# Patient Record
Sex: Female | Born: 1970 | Race: Black or African American | Hispanic: No | Marital: Married | Smoking: Former smoker
Health system: Southern US, Community
[De-identification: ages and names within clinical notes are randomized; demographics above are authoritative.]

## PROBLEM LIST (undated history)

## (undated) DIAGNOSIS — I2699 Other pulmonary embolism without acute cor pulmonale: Secondary | ICD-10-CM

## (undated) DIAGNOSIS — M797 Fibromyalgia: Secondary | ICD-10-CM

## (undated) DIAGNOSIS — E282 Polycystic ovarian syndrome: Secondary | ICD-10-CM

## (undated) DIAGNOSIS — R000 Tachycardia, unspecified: Secondary | ICD-10-CM

## (undated) DIAGNOSIS — I82602 Acute embolism and thrombosis of unspecified veins of left upper extremity: Secondary | ICD-10-CM

## (undated) DIAGNOSIS — M7732 Calcaneal spur, left foot: Secondary | ICD-10-CM

## (undated) DIAGNOSIS — M65312 Trigger thumb, left thumb: Secondary | ICD-10-CM

## (undated) DIAGNOSIS — I10 Essential (primary) hypertension: Secondary | ICD-10-CM

## (undated) DIAGNOSIS — E039 Hypothyroidism, unspecified: Secondary | ICD-10-CM

## (undated) DIAGNOSIS — M1990 Unspecified osteoarthritis, unspecified site: Secondary | ICD-10-CM

## (undated) DIAGNOSIS — H409 Unspecified glaucoma: Secondary | ICD-10-CM

## (undated) DIAGNOSIS — J449 Chronic obstructive pulmonary disease, unspecified: Secondary | ICD-10-CM

## (undated) DIAGNOSIS — M5126 Other intervertebral disc displacement, lumbar region: Secondary | ICD-10-CM

## (undated) DIAGNOSIS — E785 Hyperlipidemia, unspecified: Secondary | ICD-10-CM

## (undated) DIAGNOSIS — Z9889 Other specified postprocedural states: Secondary | ICD-10-CM

## (undated) DIAGNOSIS — F419 Anxiety disorder, unspecified: Secondary | ICD-10-CM

## (undated) DIAGNOSIS — M7731 Calcaneal spur, right foot: Secondary | ICD-10-CM

## (undated) DIAGNOSIS — M6530 Trigger finger, unspecified finger: Secondary | ICD-10-CM

## (undated) DIAGNOSIS — R112 Nausea with vomiting, unspecified: Secondary | ICD-10-CM

## (undated) DIAGNOSIS — S0300XA Dislocation of jaw, unspecified side, initial encounter: Secondary | ICD-10-CM

## (undated) DIAGNOSIS — M51369 Other intervertebral disc degeneration, lumbar region without mention of lumbar back pain or lower extremity pain: Secondary | ICD-10-CM

## (undated) DIAGNOSIS — H6993 Unspecified Eustachian tube disorder, bilateral: Secondary | ICD-10-CM

## (undated) DIAGNOSIS — H6983 Other specified disorders of Eustachian tube, bilateral: Secondary | ICD-10-CM

## (undated) DIAGNOSIS — F411 Generalized anxiety disorder: Secondary | ICD-10-CM

## (undated) DIAGNOSIS — M5136 Other intervertebral disc degeneration, lumbar region: Secondary | ICD-10-CM

## (undated) DIAGNOSIS — K219 Gastro-esophageal reflux disease without esophagitis: Secondary | ICD-10-CM

## (undated) DIAGNOSIS — E119 Type 2 diabetes mellitus without complications: Secondary | ICD-10-CM

## (undated) DIAGNOSIS — G5603 Carpal tunnel syndrome, bilateral upper limbs: Secondary | ICD-10-CM

## (undated) HISTORY — PX: DILATION AND CURETTAGE OF UTERUS: SHX78

## (undated) HISTORY — DX: Dislocation of jaw, unspecified side, initial encounter: S03.00XA

## (undated) HISTORY — DX: Unspecified eustachian tube disorder, bilateral: H69.93

## (undated) HISTORY — DX: Trigger thumb, left thumb: M65.312

## (undated) HISTORY — PX: CARPAL TUNNEL RELEASE: SHX101

## (undated) HISTORY — DX: Fibromyalgia: M79.7

## (undated) HISTORY — DX: Type 2 diabetes mellitus without complications: E11.9

## (undated) HISTORY — DX: Trigger finger, unspecified finger: M65.30

## (undated) HISTORY — DX: Hyperlipidemia, unspecified: E78.5

## (undated) HISTORY — DX: Tachycardia, unspecified: R00.0

## (undated) HISTORY — PX: BREAST SURGERY: SHX581

## (undated) HISTORY — PX: EXPLORATORY LAPAROTOMY: SUR591

## (undated) HISTORY — DX: Generalized anxiety disorder: F41.1

## (undated) HISTORY — PX: OTHER SURGICAL HISTORY: SHX169

## (undated) HISTORY — DX: Other specified disorders of eustachian tube, bilateral: H69.83

---

## 1998-11-18 HISTORY — PX: REDUCTION MAMMAPLASTY: SUR839

## 2000-06-26 ENCOUNTER — Other Ambulatory Visit: Admission: RE | Admit: 2000-06-26 | Discharge: 2000-06-26 | Payer: Self-pay | Admitting: Family Medicine

## 2001-10-01 ENCOUNTER — Encounter: Admission: RE | Admit: 2001-10-01 | Discharge: 2001-11-02 | Payer: Self-pay | Admitting: Orthopaedic Surgery

## 2002-07-26 ENCOUNTER — Other Ambulatory Visit: Admission: RE | Admit: 2002-07-26 | Discharge: 2002-07-26 | Payer: Self-pay | Admitting: Family Medicine

## 2003-09-20 ENCOUNTER — Emergency Department (HOSPITAL_COMMUNITY): Admission: EM | Admit: 2003-09-20 | Discharge: 2003-09-20 | Payer: Self-pay | Admitting: Emergency Medicine

## 2003-09-20 ENCOUNTER — Emergency Department (HOSPITAL_COMMUNITY): Admission: RE | Admit: 2003-09-20 | Discharge: 2003-09-20 | Payer: Self-pay | Admitting: Emergency Medicine

## 2003-09-27 ENCOUNTER — Other Ambulatory Visit: Admission: RE | Admit: 2003-09-27 | Discharge: 2003-09-27 | Payer: Self-pay | Admitting: Family Medicine

## 2004-10-23 ENCOUNTER — Emergency Department (HOSPITAL_COMMUNITY): Admission: EM | Admit: 2004-10-23 | Discharge: 2004-10-23 | Payer: Self-pay | Admitting: Emergency Medicine

## 2004-11-07 ENCOUNTER — Ambulatory Visit (HOSPITAL_COMMUNITY): Admission: RE | Admit: 2004-11-07 | Discharge: 2004-11-07 | Payer: Self-pay | Admitting: Cardiology

## 2004-11-07 ENCOUNTER — Encounter (INDEPENDENT_AMBULATORY_CARE_PROVIDER_SITE_OTHER): Payer: Self-pay | Admitting: Cardiology

## 2004-12-25 ENCOUNTER — Emergency Department (HOSPITAL_COMMUNITY): Admission: EM | Admit: 2004-12-25 | Discharge: 2004-12-25 | Payer: Self-pay | Admitting: Emergency Medicine

## 2005-03-04 ENCOUNTER — Other Ambulatory Visit: Admission: RE | Admit: 2005-03-04 | Discharge: 2005-03-04 | Payer: Self-pay | Admitting: *Deleted

## 2006-05-05 ENCOUNTER — Emergency Department (HOSPITAL_COMMUNITY): Admission: EM | Admit: 2006-05-05 | Discharge: 2006-05-05 | Payer: Self-pay | Admitting: Emergency Medicine

## 2007-04-09 ENCOUNTER — Emergency Department (HOSPITAL_COMMUNITY): Admission: EM | Admit: 2007-04-09 | Discharge: 2007-04-10 | Payer: Self-pay | Admitting: Emergency Medicine

## 2010-03-02 ENCOUNTER — Emergency Department (HOSPITAL_COMMUNITY): Admission: EM | Admit: 2010-03-02 | Discharge: 2010-03-02 | Payer: Self-pay | Admitting: Emergency Medicine

## 2010-12-08 ENCOUNTER — Encounter: Payer: Self-pay | Admitting: Emergency Medicine

## 2011-10-08 ENCOUNTER — Other Ambulatory Visit: Payer: Self-pay | Admitting: Family Medicine

## 2011-10-08 DIAGNOSIS — E041 Nontoxic single thyroid nodule: Secondary | ICD-10-CM

## 2011-10-09 ENCOUNTER — Ambulatory Visit (HOSPITAL_COMMUNITY)
Admission: RE | Admit: 2011-10-09 | Discharge: 2011-10-09 | Disposition: A | Discharge status: Home / Self Care | Payer: Self-pay | Source: Ambulatory Visit | Attending: Family Medicine | Admitting: Family Medicine

## 2011-10-09 ENCOUNTER — Other Ambulatory Visit: Payer: Self-pay | Admitting: Family Medicine

## 2011-10-09 DIAGNOSIS — E041 Nontoxic single thyroid nodule: Secondary | ICD-10-CM | POA: Insufficient documentation

## 2011-10-09 DIAGNOSIS — Z139 Encounter for screening, unspecified: Secondary | ICD-10-CM

## 2011-10-14 ENCOUNTER — Other Ambulatory Visit: Payer: Self-pay | Admitting: Family Medicine

## 2011-10-14 DIAGNOSIS — R102 Pelvic and perineal pain: Secondary | ICD-10-CM

## 2011-10-17 ENCOUNTER — Other Ambulatory Visit (HOSPITAL_COMMUNITY): Payer: Self-pay

## 2011-10-30 ENCOUNTER — Other Ambulatory Visit: Payer: Self-pay | Admitting: Family Medicine

## 2011-10-30 DIAGNOSIS — M79669 Pain in unspecified lower leg: Secondary | ICD-10-CM

## 2011-11-05 ENCOUNTER — Ambulatory Visit (HOSPITAL_COMMUNITY)
Admission: RE | Admit: 2011-11-05 | Discharge: 2011-11-05 | Disposition: A | Discharge status: Home / Self Care | Payer: Medicaid Other | Source: Ambulatory Visit | Attending: Family Medicine | Admitting: Family Medicine

## 2011-11-05 ENCOUNTER — Other Ambulatory Visit: Payer: Self-pay | Admitting: Family Medicine

## 2011-11-05 ENCOUNTER — Other Ambulatory Visit (HOSPITAL_COMMUNITY): Payer: Self-pay | Admitting: Family Medicine

## 2011-11-05 DIAGNOSIS — M79669 Pain in unspecified lower leg: Secondary | ICD-10-CM

## 2011-11-05 DIAGNOSIS — R102 Pelvic and perineal pain: Secondary | ICD-10-CM

## 2011-11-05 DIAGNOSIS — R0989 Other specified symptoms and signs involving the circulatory and respiratory systems: Secondary | ICD-10-CM

## 2011-11-05 DIAGNOSIS — R9389 Abnormal findings on diagnostic imaging of other specified body structures: Secondary | ICD-10-CM | POA: Insufficient documentation

## 2011-11-05 DIAGNOSIS — N949 Unspecified condition associated with female genital organs and menstrual cycle: Secondary | ICD-10-CM | POA: Insufficient documentation

## 2011-11-08 ENCOUNTER — Ambulatory Visit (HOSPITAL_COMMUNITY)
Admission: RE | Admit: 2011-11-08 | Discharge: 2011-11-08 | Payer: Medicaid Other | Source: Ambulatory Visit | Attending: Family Medicine | Admitting: Family Medicine

## 2011-11-20 ENCOUNTER — Ambulatory Visit (HOSPITAL_COMMUNITY)
Admission: RE | Admit: 2011-11-20 | Discharge: 2011-11-20 | Disposition: A | Discharge status: Home / Self Care | Payer: Medicaid Other | Source: Ambulatory Visit | Attending: Family Medicine | Admitting: Family Medicine

## 2011-11-20 DIAGNOSIS — R229 Localized swelling, mass and lump, unspecified: Secondary | ICD-10-CM | POA: Insufficient documentation

## 2011-11-20 DIAGNOSIS — R0989 Other specified symptoms and signs involving the circulatory and respiratory systems: Secondary | ICD-10-CM

## 2011-11-20 DIAGNOSIS — D1779 Benign lipomatous neoplasm of other sites: Secondary | ICD-10-CM | POA: Insufficient documentation

## 2011-11-20 LAB — CREATININE, SERUM
Creatinine, Ser: 0.81 mg/dL (ref 0.50–1.10)
GFR calc Af Amer: >90 mL/min (ref >90)
GFR calc non Af Amer: 88 mL/min — ABNORMAL LOW (ref >90)

## 2011-11-20 LAB — BUN: BUN: 14 mg/dL (ref 6–23)

## 2011-11-20 MED ORDER — GADOBENATE DIMEGLUMINE 529 MG/ML IV SOLN
15.0000 mL | Freq: Once | INTRAVENOUS | Status: AC | PRN
  Administered 2011-11-20: 15 mL via INTRAVENOUS

## 2011-12-17 ENCOUNTER — Other Ambulatory Visit: Payer: Self-pay | Admitting: Family Medicine

## 2011-12-17 DIAGNOSIS — Z09 Encounter for follow-up examination after completed treatment for conditions other than malignant neoplasm: Secondary | ICD-10-CM

## 2011-12-20 ENCOUNTER — Other Ambulatory Visit (HOSPITAL_COMMUNITY): Payer: Self-pay | Admitting: Family Medicine

## 2011-12-20 ENCOUNTER — Ambulatory Visit (HOSPITAL_COMMUNITY)
Admission: RE | Admit: 2011-12-20 | Discharge: 2011-12-20 | Disposition: A | Discharge status: Home / Self Care | Payer: Medicaid Other | Source: Ambulatory Visit | Attending: Family Medicine | Admitting: Family Medicine

## 2011-12-20 DIAGNOSIS — Z09 Encounter for follow-up examination after completed treatment for conditions other than malignant neoplasm: Secondary | ICD-10-CM

## 2011-12-20 DIAGNOSIS — R9389 Abnormal findings on diagnostic imaging of other specified body structures: Secondary | ICD-10-CM | POA: Insufficient documentation

## 2011-12-20 DIAGNOSIS — N949 Unspecified condition associated with female genital organs and menstrual cycle: Secondary | ICD-10-CM | POA: Insufficient documentation

## 2012-02-24 ENCOUNTER — Encounter (HOSPITAL_COMMUNITY): Payer: Self-pay | Admitting: Emergency Medicine

## 2012-02-24 ENCOUNTER — Emergency Department (HOSPITAL_COMMUNITY)
Admission: EM | Admit: 2012-02-24 | Discharge: 2012-02-24 | Disposition: A | Discharge status: Home / Self Care | Payer: Self-pay | Attending: Emergency Medicine | Admitting: Emergency Medicine

## 2012-02-24 DIAGNOSIS — Z79899 Other long term (current) drug therapy: Secondary | ICD-10-CM | POA: Insufficient documentation

## 2012-02-24 DIAGNOSIS — N949 Unspecified condition associated with female genital organs and menstrual cycle: Secondary | ICD-10-CM | POA: Insufficient documentation

## 2012-02-24 DIAGNOSIS — R109 Unspecified abdominal pain: Secondary | ICD-10-CM | POA: Insufficient documentation

## 2012-02-24 DIAGNOSIS — N898 Other specified noninflammatory disorders of vagina: Secondary | ICD-10-CM | POA: Insufficient documentation

## 2012-02-24 DIAGNOSIS — R102 Pelvic and perineal pain: Secondary | ICD-10-CM

## 2012-02-24 DIAGNOSIS — Z9889 Other specified postprocedural states: Secondary | ICD-10-CM | POA: Insufficient documentation

## 2012-02-24 DIAGNOSIS — J45909 Unspecified asthma, uncomplicated: Secondary | ICD-10-CM | POA: Insufficient documentation

## 2012-02-24 MED ORDER — AZITHROMYCIN 250 MG PO TABS
1000.0000 mg | ORAL_TABLET | Freq: Once | ORAL | Status: AC
  Administered 2012-02-24: 1000 mg via ORAL
  Filled 2012-02-24: qty 4

## 2012-02-24 MED ORDER — METRONIDAZOLE 500 MG PO TABS: 500.0000 mg | ORAL_TABLET | Freq: Three times a day (TID) | ORAL | Status: AC

## 2012-02-24 MED ORDER — DOXYCYCLINE HYCLATE 100 MG PO CAPS: 100.0000 mg | ORAL_CAPSULE | Freq: Two times a day (BID) | ORAL | Status: AC

## 2012-02-24 MED ORDER — CEFTRIAXONE SODIUM 250 MG IJ SOLR
250.0000 mg | Freq: Once | INTRAMUSCULAR | Status: AC
  Administered 2012-02-24: 250 mg via INTRAMUSCULAR
  Filled 2012-02-24: qty 250

## 2012-02-24 MED ORDER — LIDOCAINE HCL (PF) 2 % IJ SOLN
2.0000 mL | Freq: Once | INTRAMUSCULAR | Status: AC
  Administered 2012-02-24: 2 mL
  Filled 2012-02-24: qty 10

## 2012-02-24 MED ORDER — OXYCODONE-ACETAMINOPHEN 5-325 MG PO TABS
1.0000 | ORAL_TABLET | Freq: Once | ORAL | Status: AC
  Administered 2012-02-24: 1 via ORAL
  Filled 2012-02-24: qty 2

## 2012-02-24 MED ORDER — HYDROCODONE-ACETAMINOPHEN 5-325 MG PO TABS: 1.0000 | ORAL_TABLET | Freq: Four times a day (QID) | ORAL | Status: AC | PRN

## 2012-02-24 NOTE — ED Notes (Signed)
Pt states she thinks she may have a tampon stuck in her vagina. Pt c/o lower abd pain since last night.

## 2012-02-24 NOTE — ED Provider Notes (Signed)
History    This chart was scribed for Jennifer Lennert, MD, MD by Smitty Pluck. The patient was seen in room APA12 and the patient's care was started at 11:36AM.   CSN: [id number]  Arrival date & time 02/24/12  0932   First MD Initiated Contact with Patient 02/24/12 1133      Chief Complaint  Patient presents with  . Foreign Body in Vagina    (Consider location/radiation/quality/duration/timing/severity/associated sxs/prior treatment) Patient is a 41 y.o. female presenting with foreign body in vagina. The history is provided by the patient.  Foreign Body in Vagina This is a new problem. The current episode started 12 to 24 hours ago. The problem occurs constantly. The problem has not changed since onset.The symptoms are aggravated by nothing. The symptoms are relieved by nothing. She has tried nothing for the symptoms.   Jennifer Mullins is a 41 y.o. female who presents to the Emergency Department complaining of foreign body in vagina onset 1 day ago. Pt reports having fibroid tumor. She states "she thinks that she may have a tampon stuck in her vagina." the pain has been constant since onset without radiation.   Past Medical History  Diagnosis Date  . Asthma     Past Surgical History  Procedure Date  . Breast surgery   . Carpal tunnel release   . Exploratory laparotomy     No family history on file.  History  Substance Use Topics  . Smoking status: Never Smoker   . Smokeless tobacco: Not on file  . Alcohol Use: Yes     occasional    OB History    Grav Para Term Preterm Abortions TAB SAB Ect Mult Living                  Review of Systems  All other systems reviewed and are negative.  10 Systems reviewed and all are negative for acute change except as noted in the HPI.    Allergies  Cardizem and Morphine and related  Home Medications   Current Outpatient Rx  Name Route Sig Dispense Refill  . ALBUTEROL SULFATE HFA 108 (90 BASE) MCG/ACT IN AERS Inhalation  Inhale 1-2 puffs into the lungs daily as needed. Rescue inhaler    . ALPRAZOLAM 0.5 MG PO TABS Oral Take 0.5 mg by mouth 2 (two) times daily.    . ATENOLOL 25 MG PO TABS Oral Take 25 mg by mouth daily as needed. Patient states she only takes as needed    . BUDESONIDE-FORMOTEROL FUMARATE 160-4.5 MCG/ACT IN AERO Inhalation Inhale 1 puff into the lungs daily.    . FUROSEMIDE 20 MG PO TABS Oral Take 20 mg by mouth daily.    Marland Kitchen HYDROXYZINE HCL 25 MG PO TABS Oral Take 25 mg by mouth daily as needed. hives    . METFORMIN HCL 500 MG PO TABS Oral Take 250 mg by mouth daily.      BP 135/92  Pulse 96  Temp 98.1 F (36.7 C)  Resp 18  Ht 5\' 2"  (1.575 m)  Wt 146 lb (66.225 kg)  BMI 26.70 kg/m2  SpO2 100%  LMP 02/12/2012  Physical Exam  Nursing note and vitals reviewed. Constitutional: She is oriented to person, place, and time. She appears well-developed.  HENT:  Head: Normocephalic and atraumatic.  Eyes: Conjunctivae and EOM are normal. No scleral icterus.  Neck: Neck supple. No thyromegaly present.  Cardiovascular: Normal rate and regular rhythm.  Exam reveals no gallop and no  friction rub.   No murmur heard. Pulmonary/Chest: No stridor. She has no wheezes. She has no rales. She exhibits no tenderness.  Abdominal: She exhibits no distension. There is no tenderness. There is no rebound.  Genitourinary: Vaginal discharge found.       Minimal suprapubic discomfort No foreign body Brown discharge Tender cervix  Musculoskeletal: Normal range of motion. She exhibits no edema.  Lymphadenopathy:    She has no cervical adenopathy.  Neurological: She is oriented to person, place, and time. Coordination normal.  Skin: No rash noted. No erythema.  Psychiatric: She has a normal mood and affect. Her behavior is normal.    ED Course  Procedures (including critical care time) DIAGNOSTIC STUDIES: Oxygen Saturation is 100% on room air, normal by my interpretation.    COORDINATION OF CARE: 11:40AM  EDP discusses pt ED treatment course with pt   Labs Reviewed - No data to display No results found.   No diagnosis found.    MDM  Pelvic pain The chart was scribed for me under my direct supervision.  I personally performed the history, physical, and medical decision making and all procedures in the evaluation of this patient.Jennifer Lennert, MD 02/24/12 1336

## 2012-02-24 NOTE — Discharge Instructions (Signed)
Follow up with your md this week for recheck  °

## 2012-02-25 LAB — GC/CHLAMYDIA PROBE AMP, GENITAL
Chlamydia, DNA Probe: NEGATIVE
GC Probe Amp, Genital: NEGATIVE

## 2012-04-21 ENCOUNTER — Other Ambulatory Visit: Payer: Self-pay | Admitting: Family Medicine

## 2012-04-21 DIAGNOSIS — R9389 Abnormal findings on diagnostic imaging of other specified body structures: Secondary | ICD-10-CM

## 2012-04-21 DIAGNOSIS — E041 Nontoxic single thyroid nodule: Secondary | ICD-10-CM

## 2012-05-14 ENCOUNTER — Other Ambulatory Visit (HOSPITAL_COMMUNITY): Payer: Self-pay | Admitting: Family Medicine

## 2012-05-14 ENCOUNTER — Ambulatory Visit (HOSPITAL_COMMUNITY)
Admission: RE | Admit: 2012-05-14 | Discharge: 2012-05-14 | Disposition: A | Discharge status: Home / Self Care | Payer: Medicaid Other | Source: Ambulatory Visit | Attending: Family Medicine | Admitting: Family Medicine

## 2012-05-14 DIAGNOSIS — E041 Nontoxic single thyroid nodule: Secondary | ICD-10-CM

## 2012-05-14 DIAGNOSIS — R9389 Abnormal findings on diagnostic imaging of other specified body structures: Secondary | ICD-10-CM

## 2012-05-14 DIAGNOSIS — N83209 Unspecified ovarian cyst, unspecified side: Secondary | ICD-10-CM | POA: Insufficient documentation

## 2012-05-14 DIAGNOSIS — D259 Leiomyoma of uterus, unspecified: Secondary | ICD-10-CM | POA: Insufficient documentation

## 2013-02-06 ENCOUNTER — Other Ambulatory Visit: Payer: Self-pay | Admitting: *Deleted

## 2013-02-08 MED ORDER — ALPRAZOLAM 0.5 MG PO TABS: 0.5000 mg | ORAL_TABLET | Freq: Two times a day (BID) | ORAL | Status: DC

## 2013-02-09 ENCOUNTER — Telehealth: Payer: Self-pay | Admitting: Nurse Practitioner

## 2013-02-09 NOTE — Telephone Encounter (Signed)
Needs rx xanax .5 . [facility]

## 2013-02-09 NOTE — Telephone Encounter (Signed)
Fax back rx for xanax. Please. [facility]

## 2013-02-09 NOTE — Telephone Encounter (Signed)
Patient has to come pick up RX we do not fax in controlled meds

## 2013-02-09 NOTE — Telephone Encounter (Signed)
Please advise 

## 2013-02-09 NOTE — Telephone Encounter (Signed)
Xanax rx was printed and here for pickup, [facility] aware. LM for pt on phone

## 2013-02-10 ENCOUNTER — Telehealth: Payer: Self-pay | Admitting: Family Medicine

## 2013-02-10 NOTE — Telephone Encounter (Signed)
rx is up front to pick up patient aware

## 2013-02-10 NOTE — Telephone Encounter (Signed)
Recall about xanax rx. [facility] called yesterday

## 2013-02-10 NOTE — Telephone Encounter (Signed)
rx ready to pick up

## 2013-03-01 ENCOUNTER — Ambulatory Visit: Payer: Self-pay | Admitting: Nurse Practitioner

## 2013-03-10 ENCOUNTER — Telehealth: Payer: Self-pay | Admitting: Nurse Practitioner

## 2013-03-10 NOTE — Telephone Encounter (Signed)
Please advise 

## 2013-03-10 NOTE — Telephone Encounter (Signed)
PATIENT AWARE

## 2013-03-10 NOTE — Telephone Encounter (Signed)
Wait and get labs done at appointment. Info not in computer yet

## 2013-03-12 ENCOUNTER — Ambulatory Visit: Payer: Self-pay | Admitting: Nurse Practitioner

## 2013-03-15 ENCOUNTER — Other Ambulatory Visit: Payer: Self-pay | Admitting: *Deleted

## 2013-03-15 MED ORDER — ALPRAZOLAM 0.5 MG PO TABS: 0.5000 mg | ORAL_TABLET | Freq: Two times a day (BID) | ORAL | Status: DC

## 2013-03-15 NOTE — Telephone Encounter (Signed)
CAll in Xanax RX 0.5 BID #60 0 refills

## 2013-03-15 NOTE — Telephone Encounter (Signed)
rx called into pharmacy

## 2013-03-15 NOTE — Telephone Encounter (Signed)
Last filled 02/06/13. Has appt with you on 04/14/13. If approved have nurse to call into [facility] [phone]

## 2013-03-23 ENCOUNTER — Other Ambulatory Visit: Payer: Self-pay | Admitting: Nurse Practitioner

## 2013-03-24 NOTE — Telephone Encounter (Signed)
LABS SINCE 2012. NO OV SINCE 3/13.

## 2013-04-08 ENCOUNTER — Ambulatory Visit (HOSPITAL_COMMUNITY)
Admission: RE | Admit: 2013-04-08 | Discharge: 2013-04-08 | Disposition: A | Discharge status: Home / Self Care | Payer: Disability Insurance | Source: Ambulatory Visit | Attending: Family Medicine | Admitting: Family Medicine

## 2013-04-08 ENCOUNTER — Other Ambulatory Visit (HOSPITAL_COMMUNITY): Payer: Self-pay | Admitting: *Deleted

## 2013-04-08 DIAGNOSIS — R52 Pain, unspecified: Secondary | ICD-10-CM

## 2013-04-08 DIAGNOSIS — M25519 Pain in unspecified shoulder: Secondary | ICD-10-CM | POA: Insufficient documentation

## 2013-04-13 ENCOUNTER — Telehealth: Payer: Self-pay | Admitting: Nurse Practitioner

## 2013-04-13 ENCOUNTER — Other Ambulatory Visit: Payer: Self-pay | Admitting: Nurse Practitioner

## 2013-04-13 NOTE — Telephone Encounter (Signed)
APPT ALREADY MADE

## 2013-04-13 NOTE — Telephone Encounter (Signed)
APPT MADE

## 2013-04-14 ENCOUNTER — Ambulatory Visit: Payer: Self-pay | Admitting: Nurse Practitioner

## 2013-04-15 NOTE — Telephone Encounter (Signed)
Please call in xanax prescription

## 2013-04-15 NOTE — Telephone Encounter (Signed)
LAST OV 01/22/12. LAT RF 03/15/13. CALL IN [facility] ON [address] [phone]

## 2013-04-15 NOTE — Telephone Encounter (Signed)
rx called into [facility] and pt aware by VM

## 2013-04-21 ENCOUNTER — Ambulatory Visit: Payer: Self-pay | Admitting: Nurse Practitioner

## 2013-05-14 ENCOUNTER — Telehealth: Payer: Self-pay | Admitting: Nurse Practitioner

## 2013-05-14 ENCOUNTER — Other Ambulatory Visit: Payer: Self-pay | Admitting: Nurse Practitioner

## 2013-05-14 MED ORDER — ALPRAZOLAM 0.5 MG PO TABS: 0.5000 mg | ORAL_TABLET | Freq: Two times a day (BID) | ORAL | Status: DC

## 2013-05-14 NOTE — Telephone Encounter (Signed)
patient notified rx called in

## 2013-05-19 ENCOUNTER — Telehealth: Payer: Self-pay | Admitting: Nurse Practitioner

## 2013-05-19 NOTE — Telephone Encounter (Signed)
appt given for 2:30 WITH wlw tomorrow

## 2013-05-20 ENCOUNTER — Ambulatory Visit (INDEPENDENT_AMBULATORY_CARE_PROVIDER_SITE_OTHER): Payer: BC Managed Care – PPO | Admitting: Physician Assistant

## 2013-05-20 ENCOUNTER — Encounter: Payer: Self-pay | Admitting: Physician Assistant

## 2013-05-20 VITALS — BP 153/93 | HR 99 | Temp 98.4°F | Ht 63.0 in | Wt 146.0 lb

## 2013-05-20 DIAGNOSIS — J45901 Unspecified asthma with (acute) exacerbation: Secondary | ICD-10-CM

## 2013-05-20 DIAGNOSIS — J45909 Unspecified asthma, uncomplicated: Secondary | ICD-10-CM | POA: Insufficient documentation

## 2013-05-20 DIAGNOSIS — E119 Type 2 diabetes mellitus without complications: Secondary | ICD-10-CM

## 2013-05-20 DIAGNOSIS — R7303 Prediabetes: Secondary | ICD-10-CM | POA: Insufficient documentation

## 2013-05-20 DIAGNOSIS — J3489 Other specified disorders of nose and nasal sinuses: Secondary | ICD-10-CM

## 2013-05-20 DIAGNOSIS — J4531 Mild persistent asthma with (acute) exacerbation: Secondary | ICD-10-CM

## 2013-05-20 DIAGNOSIS — R0981 Nasal congestion: Secondary | ICD-10-CM

## 2013-05-20 DIAGNOSIS — I10 Essential (primary) hypertension: Secondary | ICD-10-CM

## 2013-05-20 MED ORDER — AMOXICILLIN-POT CLAVULANATE 875-125 MG PO TABS: 1.0000 | ORAL_TABLET | Freq: Two times a day (BID) | ORAL | Status: DC

## 2013-05-20 MED ORDER — FLUCONAZOLE 150 MG PO TABS: 150.0000 mg | ORAL_TABLET | Freq: Once | ORAL | Status: DC

## 2013-05-20 MED ORDER — METHYLPREDNISOLONE ACETATE 80 MG/ML IJ SUSP
80.0000 mg | Freq: Once | INTRAMUSCULAR | Status: AC
  Administered 2013-05-20: 80 mg via INTRAMUSCULAR

## 2013-05-20 NOTE — Progress Notes (Signed)
Unable to obtain a reading for a pre neb peak flow.  Post neb peak flows are documented.

## 2013-05-20 NOTE — Progress Notes (Signed)
Subjective:     Patient ID: Jennifer Mullins, female   DOB: [birth year], 42 y.o.   MRN: [id number]  HPI Pt with a 2 week hx of sinus pain and pressure as well as intermit asthma attacks States attacks have become more frequent over the last yr She has used here Symbicort and Alb inhaler Also using neb machine at home Pt here due to being more SOB and wheezing  Review of Systems  All other systems reviewed and are negative.       Objective:   Physical Exam  Nursing note and vitals reviewed. Pt with audible wheeze when going in to the room Oral clear Heart- RRR w/o M Lungs-ext wheeze in all fields Pulse ox 96 Unable to do peak flow Neb tx given Following pt with better air movement and wheeze just at base Pulse ox 96-98 Peak flow ~ 150      Assessment:     Asthma- acute attack    Plan:     Cont Symbicort, Alb Inh prn, and nebs prn Add antihist decongestant Augmentin rx Due to increase in freq refer to Asthma spec Depomedrol 80IM today

## 2013-05-20 NOTE — Patient Instructions (Signed)

## 2013-05-20 NOTE — Addendum Note (Signed)
Addended by: Inis Sizer on: 05/20/2013 05:01 PM   Modules accepted: Orders

## 2013-05-25 ENCOUNTER — Telehealth: Payer: Self-pay | Admitting: Physician Assistant

## 2013-05-26 ENCOUNTER — Ambulatory Visit: Payer: Self-pay | Admitting: Nurse Practitioner

## 2013-05-27 NOTE — Telephone Encounter (Signed)
Patient states she is supposed to have a referral to an asthma and allergy doctor. Transferred patient to referrals for assistance

## 2013-06-01 ENCOUNTER — Other Ambulatory Visit: Payer: Self-pay | Admitting: Physician Assistant

## 2013-06-03 NOTE — Telephone Encounter (Signed)
One was prescribed on 05/20/13?

## 2013-06-04 ENCOUNTER — Telehealth: Payer: Self-pay | Admitting: Physician Assistant

## 2013-06-04 MED ORDER — FLUCONAZOLE 150 MG PO TABS: 150.0000 mg | ORAL_TABLET | Freq: Every day | ORAL | Status: DC

## 2013-06-04 NOTE — Telephone Encounter (Signed)
rx sent to pharmacy

## 2013-06-08 ENCOUNTER — Institutional Professional Consult (permissible substitution): Payer: Self-pay | Admitting: Internal Medicine

## 2013-06-09 ENCOUNTER — Other Ambulatory Visit: Payer: Self-pay | Admitting: Family Medicine

## 2013-06-09 MED ORDER — FLUCONAZOLE 150 MG PO TABS: 150.0000 mg | ORAL_TABLET | Freq: Once | ORAL | Status: DC

## 2013-06-09 NOTE — Telephone Encounter (Signed)
Diflucan rx sent in

## 2013-06-13 ENCOUNTER — Other Ambulatory Visit: Payer: Self-pay | Admitting: Nurse Practitioner

## 2013-06-14 ENCOUNTER — Telehealth: Payer: Self-pay | Admitting: Nurse Practitioner

## 2013-06-15 ENCOUNTER — Institutional Professional Consult (permissible substitution): Payer: Self-pay | Admitting: Internal Medicine

## 2013-06-15 NOTE — Telephone Encounter (Signed)
Last seen 05/20/13, last filled 05/14/13. If approved, route to your nurse to call into [facility] [phone]

## 2013-06-15 NOTE — Telephone Encounter (Signed)
Left authorization on voicemail 

## 2013-06-17 NOTE — Telephone Encounter (Signed)
Done 06/13/13

## 2013-06-21 ENCOUNTER — Telehealth: Payer: Self-pay | Admitting: Nurse Practitioner

## 2013-06-21 NOTE — Telephone Encounter (Signed)
Will be okay to miss for a couple of days- Takes for PCOS

## 2013-06-22 NOTE — Telephone Encounter (Signed)
Patient aware.

## 2013-06-25 ENCOUNTER — Institutional Professional Consult (permissible substitution): Payer: Self-pay | Admitting: Internal Medicine

## 2013-07-01 ENCOUNTER — Telehealth: Payer: Self-pay | Admitting: Nurse Practitioner

## 2013-07-01 NOTE — Telephone Encounter (Signed)
Appointment given appt for 8/20 at 9:00

## 2013-07-02 ENCOUNTER — Ambulatory Visit (INDEPENDENT_AMBULATORY_CARE_PROVIDER_SITE_OTHER): Payer: BC Managed Care – PPO | Admitting: Internal Medicine

## 2013-07-02 ENCOUNTER — Ambulatory Visit (INDEPENDENT_AMBULATORY_CARE_PROVIDER_SITE_OTHER)
Admission: RE | Admit: 2013-07-02 | Discharge: 2013-07-02 | Disposition: A | Discharge status: Home / Self Care | Payer: BC Managed Care – PPO | Source: Ambulatory Visit | Attending: Internal Medicine | Admitting: Internal Medicine

## 2013-07-02 ENCOUNTER — Telehealth: Payer: Self-pay | Admitting: Internal Medicine

## 2013-07-02 ENCOUNTER — Encounter: Payer: Self-pay | Admitting: Internal Medicine

## 2013-07-02 VITALS — BP 106/80 | HR 78 | Temp 97.8°F | Ht 62.5 in | Wt 145.0 lb

## 2013-07-02 DIAGNOSIS — J329 Chronic sinusitis, unspecified: Secondary | ICD-10-CM | POA: Insufficient documentation

## 2013-07-02 DIAGNOSIS — R05 Cough: Secondary | ICD-10-CM

## 2013-07-02 DIAGNOSIS — R059 Cough, unspecified: Secondary | ICD-10-CM

## 2013-07-02 DIAGNOSIS — J45901 Unspecified asthma with (acute) exacerbation: Secondary | ICD-10-CM

## 2013-07-02 DIAGNOSIS — J019 Acute sinusitis, unspecified: Secondary | ICD-10-CM

## 2013-07-02 DIAGNOSIS — I10 Essential (primary) hypertension: Secondary | ICD-10-CM

## 2013-07-02 MED ORDER — PREDNISONE 10 MG PO TABS: ORAL_TABLET | ORAL | Status: DC

## 2013-07-02 MED ORDER — BUDESONIDE-FORMOTEROL FUMARATE 160-4.5 MCG/ACT IN AERO: INHALATION_SPRAY | RESPIRATORY_TRACT | Status: DC

## 2013-07-02 MED ORDER — HYDROCODONE-ACETAMINOPHEN 5-325 MG PO TABS: 1.0000 | ORAL_TABLET | ORAL | Status: DC | PRN

## 2013-07-02 MED ORDER — PANTOPRAZOLE SODIUM 40 MG PO TBEC: 40.0000 mg | DELAYED_RELEASE_TABLET | Freq: Every day | ORAL | Status: DC

## 2013-07-02 MED ORDER — AMOXICILLIN-POT CLAVULANATE 875-125 MG PO TABS: 1.0000 | ORAL_TABLET | Freq: Two times a day (BID) | ORAL | Status: DC

## 2013-07-02 MED ORDER — FAMOTIDINE 20 MG PO TABS: ORAL_TABLET | ORAL | Status: DC

## 2013-07-02 MED ORDER — NEBIVOLOL HCL 5 MG PO TABS: 5.0000 mg | ORAL_TABLET | Freq: Every day | ORAL | Status: DC

## 2013-07-02 NOTE — Assessment & Plan Note (Signed)
Until we get her under control > Strongly prefer in this setting: Bystolic, the most beta -1  selective Beta blocker available in sample form, with bisoprolol the most selective generic choice  on the market.   Try bystolic 5 mg daily instead of atenolol at least in short term

## 2013-07-02 NOTE — Telephone Encounter (Signed)
Spoke with the pt She states rx for pred was not sent in  I advised will send her rx to pharmacy Will also send augmentin rx in to tx sinus infection per result note

## 2013-07-02 NOTE — Assessment & Plan Note (Addendum)
DDX of  difficult airways managment all start with A and  include Adherence, Ace Inhibitors, Acid Reflux, Active Sinus Disease, Alpha 1 Antitripsin deficiency, Anxiety masquerading as Airways dz,  ABPA,  allergy(esp in young), Aspiration (esp in elderly), Adverse effects of DPI,  Active smokers, plus two Bs  = Bronchiectasis and Beta blocker use..and one C= CHF  Adherence is always the initial "prime suspect" and is a multilayered concern that requires a "trust but verify" approach in every patient - starting with knowing how to use medications, especially inhalers, correctly, keeping up with refills and understanding the fundamental difference between maintenance and prns vs those medications only taken for a very short course and then stopped and not refilled.  The proper method of use, as well as anticipated side effects, of a metered-dose inhaler are discussed and demonstrated to the patient. Improved effectiveness after extensive coaching during this visit to a level of approximately  75% from baseline of < < 25 %   Will try again to teach her symbiocort 160 2 bid  ? Acid reflux > max rx short term  Active sinus dz > see acute sinusitis  ? Beta blocker effect (doubt ) > see hbp    Each maintenance medication was reviewed in detail including most importantly the difference between maintenance and as needed and under what circumstances the prns are to be used.  Please see instructions for details which were reviewed in writing and the patient given a copy.

## 2013-07-02 NOTE — Progress Notes (Signed)
  Subjective:    Patient ID: Jennifer Mullins, female    DOB: 1971/10/08 MRN: [id number]  HPI  89 yobf with lifelong sob dx as self treated with primatene worse when smoked quit at age 42 referred By Dr Christell Constant 07/02/2013 to pulmonary clinic after trip to [facility] hosp last admit there Jan 2014   07/02/2013 1st pulmonary cc lifelong cough / wheeze/ sob the only time a lot better was after admit and ER trip and getting worse x one week with congested cough with minimally discolored mucus. Assoc with nasal congestion and acute on chronic frontal ha.   Supposed to be on symbicort but doesn't understand how to use it (see a/p), using saba up to 4 x daily and freq noct awakenings as well .  No obvious daytime variabilty or assoc chronic cough or cp or chest tightness, subjective wheeze overt   hb symptoms. No unusual exp hx or h/o childhood pna  or knowledge of premature birth.     Also denies any obvious fluctuation of symptoms with weather or environmental changes or other aggravating or alleviating factors except as outlined above     Review of Systems  Constitutional: Negative for fever, chills and unexpected weight change.  HENT: Positive for congestion, rhinorrhea, postnasal drip and sinus pressure. Negative for ear pain, nosebleeds, sore throat, sneezing, trouble swallowing, dental problem and voice change.   Eyes: Negative for visual disturbance.  Respiratory: Positive for cough and shortness of breath. Negative for choking.   Cardiovascular: Negative for chest pain and leg swelling.  Gastrointestinal: Negative for vomiting, abdominal pain and diarrhea.  Genitourinary: Negative for difficulty urinating.  Musculoskeletal: Positive for arthralgias.  Skin: Negative for rash.  Neurological: Positive for headaches. Negative for tremors and syncope.  Hematological: Does not bruise/bleed easily.       Objective:   Physical Exam  amb bf with mod pseudowheeze Wt Readings from Last 3  Encounters:  07/02/13 145 lb (65.772 kg)  05/20/13 146 lb (66.225 kg)  02/24/12 146 lb (66.225 kg)     HEENT: nl dentition, turbinates, and orophanx. Nl external ear canals without cough reflex   NECK :  without JVD/Nodes/TM/ nl carotid upstrokes bilaterally   LUNGS: no acc muscle use,  inps and exp distant wheezes   CV:  RRR  no s3 or murmur or increase in P2, no edema   ABD:  soft and nontender with nl excursion in the supine position. No bruits or organomegaly, bowel sounds nl  MS:  warm without deformities, calf tenderness, cyanosis or clubbing  SKIN: warm and dry without lesions    NEURO:  alert, approp, no deficits    07/02/2013 sinus ct  Acute left maxillary sinusitis.       Assessment & Plan:

## 2013-07-02 NOTE — Patient Instructions (Addendum)
Pantoprazole (protonix) 40 mg   Take 30-60 min before first meal of the day and Pepcid 20 mg one bedtime until return to office - this is the best way to tell whether stomach acid is contributing to your problem.   GERD (REFLUX)  is an extremely common cause of respiratory symptoms, many times with no significant heartburn at all.    It can be treated with medication, but also with lifestyle changes including avoidance of late meals, excessive alcohol, smoking cessation, and avoid fatty foods, chocolate, peppermint, colas, red wine, and acidic juices such as orange juice.  NO MINT OR MENTHOL PRODUCTS SO NO COUGH DROPS  USE SUGARLESS CANDY INSTEAD (jolley ranchers or Stover's)  NO OIL BASED VITAMINS - use powdered substitutes.    Also  Stop tenormin (atenolol) and use bystolic 5 mg daily   For pain or cough use vicodin every 4 hours as needed  Symbicort 160 Take 2 puffs first thing in am and then another 2 puffs about 12 hours later.   Work on inhaler technique:  relax and gently blow all the way out then take a nice smooth deep breath back in, triggering the inhaler at same time you start breathing in.  Hold for up to 5 seconds if you can.  Rinse and gargle with water when done   If your mouth or throat starts to bother you,   I suggest you time the inhaler to your dental care and after using the inhaler(s) brush teeth and tongue with a baking soda containing toothpaste and when you rinse this out, gargle with it first to see if this helps your mouth and throat.     Prednisone 10 mg take  4 each am x 2 days,   2 each am x 2 days,  1 each am x 2 days and stop   Only use your albuterol (ventolin first and neb second) as a rescue medication to be used if you can't catch your breath by resting or doing a relaxed purse lip breathing pattern. The less you use it, the better it will work when you need it.   Please see patient coordinator before you leave today  to schedule sinus CT   Please  schedule a follow up office visit in 4 weeks, sooner if needed

## 2013-07-02 NOTE — Assessment & Plan Note (Signed)
Ct sinus 07/02/2013 > Acute left maxillary sinusitis.  rx  augmentin 875 bid x 10 days

## 2013-07-07 ENCOUNTER — Encounter: Payer: Self-pay | Admitting: Nurse Practitioner

## 2013-07-07 ENCOUNTER — Ambulatory Visit (INDEPENDENT_AMBULATORY_CARE_PROVIDER_SITE_OTHER): Payer: BC Managed Care – PPO | Admitting: Nurse Practitioner

## 2013-07-07 VITALS — BP 136/79 | HR 77 | Temp 98.3°F | Ht 62.2 in | Wt 146.0 lb

## 2013-07-07 DIAGNOSIS — M545 Low back pain, unspecified: Secondary | ICD-10-CM

## 2013-07-07 DIAGNOSIS — J45901 Unspecified asthma with (acute) exacerbation: Secondary | ICD-10-CM

## 2013-07-07 DIAGNOSIS — K219 Gastro-esophageal reflux disease without esophagitis: Secondary | ICD-10-CM

## 2013-07-07 DIAGNOSIS — R609 Edema, unspecified: Secondary | ICD-10-CM

## 2013-07-07 DIAGNOSIS — E8779 Other fluid overload: Secondary | ICD-10-CM

## 2013-07-07 DIAGNOSIS — J329 Chronic sinusitis, unspecified: Secondary | ICD-10-CM

## 2013-07-07 DIAGNOSIS — I10 Essential (primary) hypertension: Secondary | ICD-10-CM

## 2013-07-07 DIAGNOSIS — E785 Hyperlipidemia, unspecified: Secondary | ICD-10-CM

## 2013-07-07 DIAGNOSIS — E042 Nontoxic multinodular goiter: Secondary | ICD-10-CM

## 2013-07-07 DIAGNOSIS — J4531 Mild persistent asthma with (acute) exacerbation: Secondary | ICD-10-CM

## 2013-07-07 DIAGNOSIS — F411 Generalized anxiety disorder: Secondary | ICD-10-CM

## 2013-07-07 DIAGNOSIS — E119 Type 2 diabetes mellitus without complications: Secondary | ICD-10-CM

## 2013-07-07 LAB — POCT URINALYSIS DIPSTICK
Bilirubin, UA: NEGATIVE
Glucose, UA: NEGATIVE
Ketones, UA: NEGATIVE
Leukocytes, UA: NEGATIVE
Nitrite, UA: NEGATIVE
Spec Grav, UA: 1.02
Urobilinogen, UA: NEGATIVE
pH, UA: 6

## 2013-07-07 LAB — POCT UA - MICROSCOPIC ONLY
Casts, Ur, LPF, POC: NEGATIVE
Crystals, Ur, HPF, POC: NEGATIVE
Yeast, UA: NEGATIVE

## 2013-07-07 LAB — POCT GLYCOSYLATED HEMOGLOBIN (HGB A1C): Hemoglobin A1C: 5.6

## 2013-07-07 MED ORDER — METFORMIN HCL 500 MG PO TABS: 500.0000 mg | ORAL_TABLET | Freq: Every day | ORAL | Status: DC

## 2013-07-07 MED ORDER — FUROSEMIDE 20 MG PO TABS: 20.0000 mg | ORAL_TABLET | Freq: Every day | ORAL | Status: DC

## 2013-07-07 MED ORDER — PRAVASTATIN SODIUM 20 MG PO TABS: 20.0000 mg | ORAL_TABLET | Freq: Every day | ORAL | Status: DC

## 2013-07-07 NOTE — Progress Notes (Signed)
Subjective:    Patient ID: Jennifer Mullins, female    DOB: 02/15/1971, 42 y.o.   MRN: [id number]  Hypertension This is a chronic problem. The current episode started more than 1 year ago. The problem is unchanged. The problem is controlled. Associated symptoms include shortness of breath. Pertinent negatives include no chest pain or palpitations. Risk factors for coronary artery disease include family history. Past treatments include beta blockers (patient has been on atenolol for years first for heat rate control and now for blood pressure- Dr. Sherene Sires doesn't want her on this medication- changed her to bystolic but patient doen't want to take.). The current treatment provides moderate improvement.  Diabetes She presents for her follow-up diabetic visit. She has type 2 diabetes mellitus. No MedicAlert identification noted. The initial diagnosis of diabetes was made 2 years ago. Her disease course has been stable. There are no hypoglycemic associated symptoms. Pertinent negatives for diabetes include no chest pain, no fatigue, no foot paresthesias, no polydipsia, no polyphagia, no polyuria, no visual change, no weakness and no weight loss. There are no hypoglycemic complications. Symptoms are stable. There are no diabetic complications. Risk factors for coronary artery disease include hypertension. Current diabetic treatment includes oral agent (monotherapy). She is compliant with treatment most of the time. Her weight is fluctuating minimally. When asked about meal planning, she reported none. She has not had a previous visit with a dietician. She rarely participates in exercise. Her breakfast blood glucose is taken between 8-9 am. Her breakfast blood glucose range is generally 130-140 mg/dl. Her overall blood glucose range is 130-140 mg/dl. An ACE inhibitor/angiotensin II receptor blocker is not being taken (due to asthma). She does not see a podiatrist.Eye exam is not current.  Asthma/COPD Saw Dr. Sherene Sires  yesterday- wants her on different blood pressure meds- symbicort BID and albuterol neb as needed. Was given prednisone dose pack for 6 days. GERD Patient on protonix and pepcid- working well except she still coughs some. Fluid retention On lasix which helps but doesn't take everyday- only takes as needed. GAD Xamax0.5 mg BID- Hospital had her up to 1mg  last time seen [place] Hydroxyzine- takes 3x a day. Hx thyroid nodules Patient went to see endocrinologist but had no insurance at time so they did not see her. Review of Systems  Constitutional: Positive for appetite change (decreased). Negative for weight loss and fatigue.  HENT: Positive for congestion, rhinorrhea and postnasal drip.   Respiratory: Positive for cough and shortness of breath. Negative for choking.   Cardiovascular: Negative for chest pain, palpitations and leg swelling.  Endocrine: Negative.  Negative for polydipsia, polyphagia and polyuria.  Genitourinary: Negative.   Musculoskeletal: Negative.   Skin: Negative.   Neurological: Negative.  Negative for weakness.       Objective:   Physical Exam  Constitutional: She is oriented to person, place, and time. She appears well-developed and well-nourished.  HENT:  Nose: Nose normal.  Mouth/Throat: Oropharynx is clear and moist.  Eyes: EOM are normal.  Neck: Trachea normal, normal range of motion and full passive range of motion without pain. Neck supple. No JVD present. Carotid bruit is not present. Thyromegaly (right) present.  Cardiovascular: Normal rate, regular rhythm, normal heart sounds and intact distal pulses.  Exam reveals no gallop and no friction rub.   No murmur heard. Pulmonary/Chest: Effort normal and breath sounds normal.  Abdominal: Soft. Bowel sounds are normal. She exhibits no distension and no mass. There is no tenderness.  Musculoskeletal: Normal range of  motion.  Lymphadenopathy:    She has no cervical adenopathy.  Neurological: She is alert and  oriented to person, place, and time. She has normal reflexes.  Skin: Skin is warm and dry.  Psychiatric: She has a normal mood and affect. Her behavior is normal. Judgment and thought content normal.    BP 136/79  Pulse 77  Temp(Src) 98.3 F (36.8 C) (Oral)  Ht 5' 2.2" (1.58 m)  Wt 146 lb (66.225 kg)  BMI 26.53 kg/m2  LMP 07/02/2013  Results for orders placed in visit on 07/07/13  POCT GLYCOSYLATED HEMOGLOBIN (HGB A1C)      Result Value Range   Hemoglobin A1C 5.6%    POCT URINALYSIS DIPSTICK      Result Value Range   Color, UA yellow     Clarity, UA clear     Glucose, UA neg     Bilirubin, UA neg     Ketones, UA neg     Spec Grav, UA 1.020     Blood, UA mod     pH, UA 6.0     Protein, UA small     Urobilinogen, UA negative     Nitrite, UA neg     Leukocytes, UA Negative    POCT UA - MICROSCOPIC ONLY      Result Value Range   WBC, Ur, HPF, POC occ     RBC, urine, microscopic 2-3     Bacteria, U Microscopic few     Mucus, UA mod     Epithelial cells, urine per micros few     Crystals, Ur, HPF, POC neg     Casts, Ur, LPF, POC neg     Yeast, UA neg          Assessment & Plan:  1. Diabetes Low carb diet Keep diary of daily blood sugars - POCT glycosylated hemoglobin (Hb A1C) - metFORMIN (GLUCOPHAGE) 500 MG tablet; Take 1 tablet (500 mg total) by mouth daily with breakfast.  Dispense: 30 tablet; Refill: 5  2. Other and unspecified hyperlipidemia Low fat diet exercise - CMP14+EGFR - NMR, lipoprofile - pravastatin (PRAVACHOL) 20 MG tablet; Take 1 tablet (20 mg total) by mouth daily.  Dispense: 30 tablet; Refill: 5  3. Low back pain Continue pain meds as rx - POCT urinalysis dipstick - POCT UA - Microscopic Only  4. Body fluid retention Lasix as needed - furosemide (LASIX) 20 MG tablet; Take 1 tablet (20 mg total) by mouth daily.  Dispense: 30 tablet; Refill: 5  5. Hypertension Low NA+ diet  6. Asthma, chronic Keep follow up of Dr. Sherene Sires  7. GERD  (gastroesophageal reflux disease) Avoid spicy and fatty foods Do not eat 2 hours prior to bedtime  8. GAD (generalized anxiety disorder) Stress management Will not increase xanax- need to stay on 0.5mg   9. Chronic sinusitis - referral to ENT  10. Thyroid nodules -referral to endocrinology  Health maintenance  Mary-Margaret Daphine Deutscher, FNP

## 2013-07-07 NOTE — Patient Instructions (Signed)

## 2013-07-08 LAB — CMP14+EGFR
ALT: 20 IU/L (ref 0–32)
AST: 13 IU/L (ref 0–40)
Albumin/Globulin Ratio: 1.8 (ref 1.1–2.5)
Albumin: 4.1 g/dL (ref 3.5–5.5)
Alkaline Phosphatase: 59 IU/L (ref 39–117)
BUN/Creatinine Ratio: 9 (ref 9–23)
BUN: 9 mg/dL (ref 6–24)
CO2: 26 mmol/L (ref 18–29)
Calcium: 9.3 mg/dL (ref 8.7–10.2)
Chloride: 100 mmol/L (ref 97–108)
Creatinine, Ser: 0.99 mg/dL (ref 0.57–1.00)
GFR calc Af Amer: 80 mL/min/{1.73_m2} (ref >59)
GFR calc non Af Amer: 70 mL/min/{1.73_m2} (ref >59)
Globulin, Total: 2.3 g/dL (ref 1.5–4.5)
Glucose: 114 mg/dL — ABNORMAL HIGH (ref 65–99)
Potassium: 3.4 mmol/L — ABNORMAL LOW (ref 3.5–5.2)
Sodium: 141 mmol/L (ref 134–144)
Total Bilirubin: 0.2 mg/dL (ref 0.0–1.2)
Total Protein: 6.4 g/dL (ref 6.0–8.5)

## 2013-07-08 LAB — NMR, LIPOPROFILE
Cholesterol: 184 mg/dL (ref <200)
HDL Cholesterol by NMR: 61 mg/dL (ref >=40)
HDL Particle Number: 33.1 umol/L (ref >=30.5)
LDL Particle Number: 1233 nmol/L — ABNORMAL HIGH (ref <1000)
LDL Size: 21.1 nm (ref >20.5)
LDLC SERPL CALC-MCNC: 94 mg/dL (ref <100)
LP-IR Score: 49 — ABNORMAL HIGH (ref <=45)
Small LDL Particle Number: 402 nmol/L (ref <=527)
Triglycerides by NMR: 146 mg/dL (ref <150)

## 2013-07-13 ENCOUNTER — Telehealth: Payer: Self-pay | Admitting: Nurse Practitioner

## 2013-07-14 NOTE — Telephone Encounter (Signed)
Referral spoke with pt.

## 2013-07-15 ENCOUNTER — Other Ambulatory Visit: Payer: Self-pay | Admitting: Physician Assistant

## 2013-07-16 NOTE — Telephone Encounter (Signed)
Last filled 06/13/13, just seen last week. Call into [facility] [phone]

## 2013-07-16 NOTE — Telephone Encounter (Signed)
Please call in rx for xanax with 0 refills

## 2013-07-20 NOTE — Telephone Encounter (Signed)
Script [facility].

## 2013-07-22 HISTORY — PX: NASAL SINUS SURGERY: SHX719

## 2013-08-02 ENCOUNTER — Encounter: Payer: Self-pay | Admitting: Internal Medicine

## 2013-08-02 ENCOUNTER — Ambulatory Visit (INDEPENDENT_AMBULATORY_CARE_PROVIDER_SITE_OTHER): Payer: BC Managed Care – PPO | Admitting: Internal Medicine

## 2013-08-02 VITALS — BP 142/90 | HR 62 | Temp 97.9°F | Ht 62.0 in | Wt 148.2 lb

## 2013-08-02 DIAGNOSIS — J45901 Unspecified asthma with (acute) exacerbation: Secondary | ICD-10-CM

## 2013-08-02 DIAGNOSIS — I10 Essential (primary) hypertension: Secondary | ICD-10-CM

## 2013-08-02 DIAGNOSIS — J019 Acute sinusitis, unspecified: Secondary | ICD-10-CM

## 2013-08-02 MED ORDER — BUDESONIDE-FORMOTEROL FUMARATE 160-4.5 MCG/ACT IN AERO: INHALATION_SPRAY | RESPIRATORY_TRACT | Status: DC

## 2013-08-02 NOTE — Progress Notes (Signed)
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 Assoc with nasal congestion and acute on chronic frontal ha. Supposed to be on symbicort but doesn't understand how to use it (see a/p), using saba up to 4 x daily and freq noct awakenings as well . rec Pantoprazole (protonix) 40 mg   Take 30-60 min before first meal of the day and Pepcid 20 mg one bedtime until return to office  GERD diet  Also  Stop tenormin (atenolol) and use bystolic 5 mg daily  For pain or cough use vicodin every 4 hours as needed Symbicort 160 Take 2 puffs first thing in am and then another 2 puffs about 12 hours later.  Work on inhaler technique:      Prednisone 10 mg take  4 each am x 2 days,   2 each am x 2 days,  1 each am x 2 days and stop  Only use your albuterol as rescue  08/02/2013 f/u ov/Wert  Chief Complaint  Patient presents with  . Follow-up    Pt states that her breathing is much improved since the last visit. She c/o having PND after recent sinus surgery Spet 4th 2014. Overall her cough has improved since her last visit.   Dr Andrey Campanile [facility] Jonita Albee did allergy testing and rec "new nasal preparation"  but hasn't started yet.  On best days sob walking like an aisle at grocery store, only using symbicort 160 2bid and not needing rescue qod.   No obvious day to day or daytime variabilty or assoc chronic cough or cp or chest tightness, subjective wheeze overt sinus or hb symptoms. No unusual exp hx or h/o childhood pna/ asthma or  knowledge of premature birth.  Sleeping ok without nocturnal  or early am exacerbation  of respiratory  c/o's or need for noct saba. Also denies any obvious fluctuation of symptoms with weather or environmental changes or other aggravating or alleviating factors except as outlined above   Current Medications, Allergies, Complete Past Medical History, Past Surgical History, Family History, and Social History were reviewed in [employer] record.  ROS  The following are not active complaints unless bolded sore throat, dysphagia, dental problems, itching, sneezing,  nasal congestion or excess/ purulent secretions, ear ache,   fever, chills, sweats, unintended wt loss, pleuritic or exertional cp, hemoptysis,  orthopnea pnd or leg swelling, presyncope, palpitations, heartburn, abdominal pain, anorexia, nausea, vomiting, diarrhea  or change in bowel or urinary habits, change in stools or urine, dysuria,hematuria,  rash, arthralgias, visual complaints, headache, numbness weakness or ataxia or problems with walking or coordination,  change in mood/affect or memory.                Objective:   Physical Exam  amb bf with mild  pseudowheeze resolves with plm  Wt Readings from Last 3 Encounters:  08/02/13 148 lb 3.2 oz (67.223 kg)  07/07/13 146 lb (66.225 kg)  07/02/13 145 lb (  65.772 kg)       HEENT: nl dentition, turbinates, and orophanx. Nl external ear canals without cough reflex   NECK :  without JVD/Nodes/TM/ nl carotid upstrokes bilaterally   LUNGS: no acc muscle use,  Clear to A and P    CV:  RRR  no s3 or murmur or increase in P2, no edema   ABD:  soft and nontender with nl excursion in the supine position. No bruits or organomegaly, bowel sounds nl  MS:  warm without deformities, calf tenderness, cyanosis or clubbing    07/02/2013 sinus ct  Acute left maxillary sinusitis> rx augmentin > ENT rx        Assessment & Plan:

## 2013-08-02 NOTE — Patient Instructions (Addendum)
You may have asthma that requires you to stay on symbicort 160 Take 2 puffs first thing in am and then another 2 puffs about 12 hours later.  (after your sinus problem is corrected for asthma and bronchitis may not require symbicort at some point     Only use your albuterol (ventolin) as a rescue medication to be used if you can't catch your breath by resting or doing a relaxed purse lip breathing pattern. The less you use it, the better it will work when you need it.   For your blood pressure you return to [facility] but you need to avoid blood pressure pills that may make you wheeze like tenormin / atenolol and I would favor bisoprolol or bystolic   If you are satisfied with your treatment plan let your doctor know and he/she can either refill your medications or you can return here when your prescription runs out.     If in any way you are not 100% satisfied,  please tell us.  If 100% better, tell your friends!

## 2013-08-02 NOTE — Assessment & Plan Note (Signed)
Sinus CT 07/02/2013 Acute left maxillary sinusitis> rx augmentin > ENT eval/ rx [facility] [place]

## 2013-08-02 NOTE — Assessment & Plan Note (Addendum)
-   08/02/2013 Spirometry nl x min decrease in FEF25-75 p am symbicort  - 08/02/2013  Walked RA x 3 laps @ 185 ft each stopped due to  End of study, no sob or desat  The proper method of use, as well as anticipated side effects, of a metered-dose inhaler are discussed and demonstrated to the patient. Improved effectiveness after extensive coaching during this visit to a level of approximately  90% with min use now of saba while on symbicort 160 2bid   Most of her symptoms at this point are attributable to sinus dz, not asthma, so no changes needed - at some point may need to consider step down treatment but not unless/ until the upper airway problems are consistently controlled.     Each maintenance medication was reviewed in detail including most importantly the difference between maintenance and as needed and under what circumstances the prns are to be used.  Please see instructions for details which were reviewed in writing and the patient given a copy.    No need for regular pulmonary f/u at this point

## 2013-08-02 NOTE — Assessment & Plan Note (Signed)
Adequate control on present rx, reviewed > no change in rx needed  > Strongly prefer in this setting: Bystolic, the most beta -1  selective Beta blocker available in sample form, with bisoprolol the most selective generic choice  on the market.

## 2013-08-05 ENCOUNTER — Telehealth: Payer: Self-pay | Admitting: Internal Medicine

## 2013-08-05 NOTE — Telephone Encounter (Signed)
I spoke with pt. Jennifer Mullins did not state for her to stop these 2 medications and if she was not told then she is to continue these medications. She was making sure before she got a refill. Nothing further needed

## 2013-08-16 ENCOUNTER — Other Ambulatory Visit: Payer: Self-pay | Admitting: Nurse Practitioner

## 2013-08-17 NOTE — Telephone Encounter (Signed)
Last filled 07/15/13, last seen 07/07/13, route to poolB so it can be called into [facility] [id number]

## 2013-08-17 NOTE — Telephone Encounter (Signed)
Please call in xanax rx 

## 2013-08-20 ENCOUNTER — Telehealth: Payer: Self-pay | Admitting: Nurse Practitioner

## 2013-08-20 NOTE — Telephone Encounter (Signed)
Wants med for pain.

## 2013-08-21 ENCOUNTER — Other Ambulatory Visit: Payer: Self-pay | Admitting: Nurse Practitioner

## 2013-08-21 NOTE — Telephone Encounter (Signed)
Needs to get from neurologist or orthi

## 2013-08-23 NOTE — Telephone Encounter (Signed)
Called in.

## 2013-08-24 NOTE — Telephone Encounter (Signed)
Was suppose to have been phoned in on 08/16/13

## 2013-08-24 NOTE — Telephone Encounter (Signed)
Patient notified to get from neuro or ortho. Inquiring about alprazolam rx. Approved by [name]. Will call to [facility] in [place]

## 2013-08-24 NOTE — Telephone Encounter (Signed)
Already dont spoke with patient

## 2013-08-24 NOTE — Telephone Encounter (Signed)
Last seen 07/07/13  [name] If approved route to nurse to phone into [phone]

## 2013-09-08 ENCOUNTER — Telehealth: Payer: Self-pay | Admitting: Nurse Practitioner

## 2013-09-09 MED ORDER — EFLORNITHINE HCL 13.9 % EX CREA: TOPICAL_CREAM | CUTANEOUS | Status: DC

## 2013-09-09 NOTE — Telephone Encounter (Signed)
vaniqa rx sent to your pharmacy

## 2013-09-13 ENCOUNTER — Encounter: Payer: Self-pay | Admitting: General Practice

## 2013-09-13 ENCOUNTER — Ambulatory Visit (INDEPENDENT_AMBULATORY_CARE_PROVIDER_SITE_OTHER): Payer: BC Managed Care – PPO

## 2013-09-13 ENCOUNTER — Ambulatory Visit (INDEPENDENT_AMBULATORY_CARE_PROVIDER_SITE_OTHER): Payer: BC Managed Care – PPO | Admitting: General Practice

## 2013-09-13 VITALS — BP 149/97 | HR 73 | Temp 97.0°F | Ht 62.0 in | Wt 146.5 lb

## 2013-09-13 DIAGNOSIS — M79609 Pain in unspecified limb: Secondary | ICD-10-CM

## 2013-09-13 DIAGNOSIS — R609 Edema, unspecified: Secondary | ICD-10-CM

## 2013-09-13 DIAGNOSIS — M65331 Trigger finger, right middle finger: Secondary | ICD-10-CM

## 2013-09-13 DIAGNOSIS — M6530 Trigger finger, unspecified finger: Secondary | ICD-10-CM

## 2013-09-13 MED ORDER — IBUPROFEN 800 MG PO TABS: 800.0000 mg | ORAL_TABLET | Freq: Three times a day (TID) | ORAL | Status: DC | PRN

## 2013-09-13 NOTE — Progress Notes (Signed)
  Subjective:    Patient ID: Jennifer Mullins, female    DOB: November 05, 1971, 42 y.o.   MRN: [id number]  Hand Pain  The incident occurred 2 days ago. The incident occurred at home. There was no injury mechanism. The pain is present in the right hand. The quality of the pain is described as aching. The pain does not radiate. The pain is at a severity of 10/10. The pain is moderate. The pain has been constant since the incident. Pertinent negatives include no chest pain, muscle weakness, numbness or tingling. Nothing aggravates the symptoms. She has tried NSAIDs and acetaminophen for the symptoms. The treatment provided no relief.      Review of Systems  Constitutional: Negative for fever and chills.  Respiratory: Negative for chest tightness and shortness of breath.   Cardiovascular: Negative for chest pain and palpitations.  Musculoskeletal:       Right hand swelling and stiffness of middle finger  Neurological: Negative for dizziness, tingling, weakness and numbness.       Objective:   Physical Exam  Constitutional: She is oriented to person, place, and time. She appears well-developed and well-nourished.  Cardiovascular: Normal rate, regular rhythm and normal heart sounds.   Pulmonary/Chest: Effort normal and breath sounds normal.  Musculoskeletal: She exhibits edema and tenderness.  Dorsal right hand swelling and tenderness. Right hand middle digit stiffness. Capillary refill less than 3 seconds and radial pulse 2+.   Neurological: She is alert and oriented to person, place, and time.  Skin: Skin is warm and dry.  Psychiatric: She has a normal mood and affect.      [facility] reading (PRIMARY) by Coralie Keens, FNP-C, no fracture or dislocation.                                      Assessment & Plan:  1. Swollen  - DG Hand Complete Right; Future  2. Trigger middle finger, right  - ibuprofen (ADVIL,MOTRIN) 800 MG tablet; Take 1 tablet (800 mg total) by mouth every 8 (eight)  hours as needed for pain.  Dispense: 30 tablet; Refill: 0 -Appointment made for Thursday Oct. 30 to see Dr. Ophelia Charter for this issue -RTO if symptoms worsen, prior to ortho appointment -Patient verbalized understanding -Coralie Keens, FNP-C   Appointment scheduled for Dr. Clifton Custard office in [place] at [place], Thursday

## 2013-09-13 NOTE — Patient Instructions (Signed)
Trigger Finger  Trigger finger (digital tendinitis and stenosing tenosynovitis) is a common disorder that causes an often painful catching of the fingers or thumb. It occurs as a clicking, snapping or locking of a finger in the palm of the hand. The reason for this is that there is a problem with the tendons which flex the fingers sliding smoothly through their sheaths. The cause of this may be inflammation of the tendon and sheath, or from a thickening or nodule in the tendon. The condition may occur in any finger or a couple fingers at the same time. The cause may be overuse while doing the same activity over and over again with your hands.   Tendons are the tough cords that connect the muscles to bones. Muscles and tendons are part of the system which allows your body to move. When muscles contract in the forearm on the palm side, they pull the tendons toward the elbow and cause the fingers and thumb to bend (flex) toward the palm. These are the flexor tendons. The tendons slide through a slippery smooth membrane (synovium) which is called the tendon sheath. The sheaths have areas of tough fibrous tissues surrounding them which hold the tendons close to the bone. These are called pulleys because they work like a pulley. The first pulley is in the palm of the hand near the crease which runs across your palm. If the area of the tendon thickening is near the pulley, the tendon cannot slide smoothly through the pulley and this causes the trigger finger.  The finger may lock with the finger curled or suddenly straighten out with a snap. This is more common in patients with rheumatoid arthritis and diabetes. Left untreated, the condition may get worse to the point where the finger becomes locked in flexion, like making a fist, or less commonly locked with the finger straightened out.  DIAGNOSIS   Your caregiver will easily make this diagnosis on examination.  TREATMENT   · Splinting for 6 to 8 weeks of time may be  helpful. Use the splints as your caregiver suggests.  · Heat used for twenty minutes at least four times a day followed by ice packs for twenty minutes unless directed otherwise by your caregiver may be helpful. If you find either heat or cold seems to be making the problem worse, quit using them and ask your caregiver for directions.  · Cortisone injections along with splinting may speed up recovery. Several injections may be required. Cortisone may give relief after one injection.  · Only take over-the-counter or prescription medicines for pain, discomfort, or fever as directed by your caregiver.  · Surgery is another treatment that may be used if conservative treatments using injection and splinting does not work. Surgery can be minor without incisions (a cut does not have to be made) and can be done with a needle through the skin. No stitches are needed and most patients may return to work the same day.  · Other surgical choices involve an open procedure where the surgeon opens the hand through a small incision (cut) and cuts the pulley so the tendon can again slide smoothly. Your hand will still work fine. This small operation requires stitches and the recovery will be a little longer and the incisions will need to be protected until completely healed. You may have to limit your activities for up to 6 months.  · Occupational or hand therapy may be required if there is stiffness remaining in the finger.    RISKS AND COMPLICATIONS  Complications are uncommon but some problems that may occur are:  · Recurrence of the trigger finger. This does not mean that the surgery was not well done. It simply means that you may have formed scar tissue following surgery that causes the problem to reoccur.  · Infection which could ruin the results of the surgery and can result in a finger which is frozen and can not move normally.  · Nerve injury is possible which could result in permanent numbness of one or more fingers.  CARE  AFTER SURGERY  · Elevate your hand above your heart and use ice as instructed.  · Follow instructions regarding finger motion/exercise.  · Keep the surgical wound dry for at least 48 hrs or longer if instructed.  · Keep your follow-up appointments.  · Return to work and normal activities as instructed.  SEEK IMMEDIATE MEDICAL CARE IF:   Your problems are getting worse or you do not obtain relief from the treatment.  Document Released: 08/24/2004 Document Revised: 01/27/2012 Document Reviewed: 04/18/2009  ExitCare® Patient Information ©2014 [employer].

## 2013-09-14 ENCOUNTER — Other Ambulatory Visit: Payer: Self-pay | Admitting: Nurse Practitioner

## 2013-09-16 ENCOUNTER — Other Ambulatory Visit: Payer: Self-pay

## 2013-09-16 ENCOUNTER — Telehealth: Payer: Self-pay | Admitting: Nurse Practitioner

## 2013-09-16 MED ORDER — NEBIVOLOL HCL 5 MG PO TABS: 5.0000 mg | ORAL_TABLET | Freq: Every day | ORAL | Status: DC

## 2013-09-16 MED ORDER — ALPRAZOLAM 0.5 MG PO TABS: 0.5000 mg | ORAL_TABLET | Freq: Two times a day (BID) | ORAL | Status: DC

## 2013-09-16 MED ORDER — HYDROXYZINE HCL 50 MG PO TABS: 50.0000 mg | ORAL_TABLET | Freq: Three times a day (TID) | ORAL | Status: DC | PRN

## 2013-09-16 NOTE — Telephone Encounter (Signed)
Please call in xanax 0.5mg  BID prn

## 2013-09-16 NOTE — Telephone Encounter (Signed)
RX called in .

## 2013-09-17 ENCOUNTER — Other Ambulatory Visit: Payer: Self-pay | Admitting: Nurse Practitioner

## 2013-09-27 ENCOUNTER — Other Ambulatory Visit: Payer: Self-pay | Admitting: Internal Medicine

## 2013-10-13 ENCOUNTER — Other Ambulatory Visit: Payer: Self-pay | Admitting: Nurse Practitioner

## 2013-10-18 NOTE — Telephone Encounter (Signed)
Last filled 09/16/13, last seen 07/07/13. Uses [facility] [place] [phone]

## 2013-10-18 NOTE — Telephone Encounter (Signed)
Called in.

## 2013-10-18 NOTE — Telephone Encounter (Signed)
Please refill xanax

## 2013-10-21 ENCOUNTER — Other Ambulatory Visit (HOSPITAL_COMMUNITY): Payer: Self-pay | Admitting: "Endocrinology

## 2013-10-21 DIAGNOSIS — E049 Nontoxic goiter, unspecified: Secondary | ICD-10-CM

## 2013-11-12 ENCOUNTER — Other Ambulatory Visit: Payer: Self-pay | Admitting: Nurse Practitioner

## 2013-11-15 ENCOUNTER — Ambulatory Visit (INDEPENDENT_AMBULATORY_CARE_PROVIDER_SITE_OTHER): Payer: BC Managed Care – PPO

## 2013-11-15 ENCOUNTER — Ambulatory Visit (INDEPENDENT_AMBULATORY_CARE_PROVIDER_SITE_OTHER): Payer: BC Managed Care – PPO | Admitting: Family Medicine

## 2013-11-15 VITALS — BP 126/78 | HR 79 | Temp 97.6°F | Ht 62.0 in | Wt 152.0 lb

## 2013-11-15 DIAGNOSIS — M899 Disorder of bone, unspecified: Secondary | ICD-10-CM

## 2013-11-15 DIAGNOSIS — M25569 Pain in unspecified knee: Secondary | ICD-10-CM

## 2013-11-15 DIAGNOSIS — M545 Low back pain, unspecified: Secondary | ICD-10-CM

## 2013-11-15 DIAGNOSIS — M25561 Pain in right knee: Secondary | ICD-10-CM

## 2013-11-15 DIAGNOSIS — W19XXXA Unspecified fall, initial encounter: Secondary | ICD-10-CM

## 2013-11-15 DIAGNOSIS — J329 Chronic sinusitis, unspecified: Secondary | ICD-10-CM

## 2013-11-15 DIAGNOSIS — M25551 Pain in right hip: Secondary | ICD-10-CM

## 2013-11-15 DIAGNOSIS — M25559 Pain in unspecified hip: Secondary | ICD-10-CM

## 2013-11-15 MED ORDER — KETOROLAC TROMETHAMINE 60 MG/2ML IM SOLN
30.0000 mg | Freq: Once | INTRAMUSCULAR | Status: AC
  Administered 2013-11-15: 30 mg via INTRAMUSCULAR

## 2013-11-15 MED ORDER — AMOXICILLIN-POT CLAVULANATE 875-125 MG PO TABS: 1.0000 | ORAL_TABLET | Freq: Two times a day (BID) | ORAL | Status: DC

## 2013-11-15 MED ORDER — TRAMADOL-ACETAMINOPHEN 37.5-325 MG PO TABS: 1.0000 | ORAL_TABLET | Freq: Four times a day (QID) | ORAL | Status: DC | PRN

## 2013-11-15 NOTE — Telephone Encounter (Signed)
Patient seen 11/15/13  Dr Alvester Morin  If approved route to nurse to call into [facility]  [phone]

## 2013-11-15 NOTE — Progress Notes (Signed)
Subjective:    Patient ID: Jennifer Mullins, female    DOB: Jul 01, 1971, 42 y.o.   MRN: [id number]  HPI  Pt presents today with chief complaint of fall  Pt accidentally fell in the rain 1 week ago, landning on R hip/buttock area  Has had significant R leg, hip and buttocks pain since this point.  No bowel or bladder anesthesia.  Pain has been fairly constant since this point.  Pt able to bear weight, albeit with pain  No distal numbness or paresthesias.   SINUSITIS Onset:  2 weeks  Location: bilateral maxillary sinuses  Description:sinus pressure   Modifying factors: s/p sinus surgery 3-4 months ago. Has both ENT and allergy follow up. Baseline allergies.   Symptoms Cough:  Minimal  Discharge:  no Fever: no Sinus Pressure:  yes Ears Blocked:  no Teeth Ache:  yes Frontal Headache:  yes Second Sickening:  no  Red Flags Change in mental state: no Change in vision: no     Review of Systems  All other systems reviewed and are negative.       Objective:   Physical Exam  Constitutional: She is oriented to person, place, and time. She appears well-developed and well-nourished.  HENT:  Head: Normocephalic and atraumatic.  Eyes: Conjunctivae are normal. Pupils are equal, round, and reactive to light.  Neck: Normal range of motion. Neck supple.  Cardiovascular: Normal rate and regular rhythm.   Pulmonary/Chest: Effort normal and breath sounds normal.  Abdominal: Soft.  Musculoskeletal:       Arms: + TTP over R l-s area as well as R buttocks + Pain with internal rotation of R hip  + trochanteric TTP; + pain over trochanteric bursa with resisted hip abduction.  Neurovascularly intact distally    Neurological: She is alert and oriented to person, place, and time.  Skin: Skin is warm.   [facility] reading (PRIMARY) by  Dr. Alvester Morin  L-s spine, R hip, R femur and R tib-fib xrays preliminarily negative for any fracture or dislocation pending formal radiological reading.                                     Dg Lumbar Spine 2-3 Views  11/15/2013   CLINICAL DATA:  Larey Seat.  Back pain.  EXAM: LUMBAR SPINE - 2-3 VIEW  COMPARISON:  None.  FINDINGS: Normal alignment on the lateral film. Disc spaces and vertebral bodies are maintained. No acute fractures identified. No definite pars defects. The visualized bony pelvis is intact.  IMPRESSION: No acute bony findings.   Electronically Signed   By: Loralie Champagne M.D.   On: 11/15/2013 10:28   Dg Hip Complete Right  11/15/2013   CLINICAL DATA:  Right hip pain.  EXAM: RIGHT HIP - COMPLETE 2+ VIEW  COMPARISON:  None.  FINDINGS: Suspect mixed lytic and sclerotic process involving the ischium. The right hip is normally located. No fracture or AVN. Pubic symphysis and right SI joint are intact.  IMPRESSION: Possible mixed lytic and sclerotic process involving the right ischium. MRI without and with contrast suggested for further evaluation.  No acute fracture or AVN.   Electronically Signed   By: Loralie Champagne M.D.   On: 11/15/2013 10:31   Dg Femur Right  11/15/2013   CLINICAL DATA:  Right thigh pain.  EXAM: RIGHT FEMUR - 2 VIEW  COMPARISON:  None.  FINDINGS: The femur is intact. No fracture  or destructive bone lesion. Suspect a mixed lytic and sclerotic lesion involving the ischium.  IMPRESSION: No acute bony findings.   Electronically Signed   By: Loralie Champagne M.D.   On: 11/15/2013 10:32   Dg Tibia/fibula Right  11/15/2013   CLINICAL DATA:  Larey Seat.  Right leg pain.  EXAM: RIGHT TIBIA AND FIBULA - 2 VIEW  COMPARISON:  None.  FINDINGS: The knee and ankle joints are maintained. No acute fracture of the tibia or fibula.  IMPRESSION: No acute bony findings.   Electronically Signed   By: Loralie Champagne M.D.   On: 11/15/2013 10:34         Assessment & Plan:  Fall, initial encounter - Plan: DG Lumbar Spine 2-3 Views, DG Hip Complete Right, DG Femur Right, DG Tibia/Fibula Right  Bone lesion  Low back pain  Right hip pain  Pain in  joint, lower leg, right  Sinusitis - Plan: amoxicillin-clavulanate (AUGMENTIN) 875-125 MG per tablet   Noted ? Lytic/bony lesion in R hip.  Will need ortho follow up for this as pt may benefit from MRI. Try to do same day appt.  May be traumatic secondary to recent fall.  Will place in crutches.  Toradol 30mg  IM x1   WIll place on course of augmentin for sinusitis coverage  Restart flonase and oral antihistamine.  Follow up with ENT if sxs persist.

## 2013-11-19 ENCOUNTER — Other Ambulatory Visit: Payer: Self-pay | Admitting: Nurse Practitioner

## 2013-11-19 NOTE — Telephone Encounter (Signed)
Patients med was denied because computer has her as dismissed but she has made an agreement to make payments so she isn't dismissed  Please route to nurse to call into [facility]   [phone]

## 2013-11-20 ENCOUNTER — Other Ambulatory Visit: Payer: Self-pay | Admitting: *Deleted

## 2013-11-20 MED ORDER — ALPRAZOLAM 0.5 MG PO TABS: 0.5000 mg | ORAL_TABLET | Freq: Two times a day (BID) | ORAL | Status: DC | PRN

## 2013-11-20 NOTE — Telephone Encounter (Signed)
Authorized by Dr. Laurance Flatten and phoned into [facility] in [place].  Patient aware.

## 2013-11-22 MED ORDER — ALPRAZOLAM 0.5 MG PO TABS: 0.5000 mg | ORAL_TABLET | Freq: Two times a day (BID) | ORAL | Status: DC | PRN

## 2013-11-22 NOTE — Telephone Encounter (Signed)
Please call in xanax rx 

## 2013-11-23 NOTE — Telephone Encounter (Signed)
Called to [facility] in [place]

## 2013-11-30 ENCOUNTER — Ambulatory Visit (HOSPITAL_COMMUNITY)
Admission: RE | Admit: 2013-11-30 | Discharge: 2013-11-30 | Disposition: A | Discharge status: Home / Self Care | Payer: BC Managed Care – PPO | Source: Ambulatory Visit | Attending: "Endocrinology | Admitting: "Endocrinology

## 2013-11-30 DIAGNOSIS — E049 Nontoxic goiter, unspecified: Secondary | ICD-10-CM

## 2013-11-30 DIAGNOSIS — E042 Nontoxic multinodular goiter: Secondary | ICD-10-CM | POA: Diagnosis present

## 2013-12-14 ENCOUNTER — Other Ambulatory Visit: Payer: Self-pay | Admitting: Nurse Practitioner

## 2013-12-17 ENCOUNTER — Telehealth: Payer: Self-pay | Admitting: Nurse Practitioner

## 2013-12-17 ENCOUNTER — Other Ambulatory Visit: Payer: Self-pay | Admitting: Nurse Practitioner

## 2013-12-17 MED ORDER — ALPRAZOLAM 0.5 MG PO TABS: 0.5000 mg | ORAL_TABLET | Freq: Two times a day (BID) | ORAL | Status: DC | PRN

## 2013-12-17 NOTE — Telephone Encounter (Signed)
Please call in xanax0.5 mg 1 po BID prn #[phone] no refills

## 2013-12-17 NOTE — Telephone Encounter (Signed)
rx called into pharmacy and patient aware.  

## 2014-01-10 ENCOUNTER — Telehealth: Payer: Self-pay | Admitting: Nurse Practitioner

## 2014-01-10 NOTE — Telephone Encounter (Signed)
PER PT SHE HAD INJECTION IN RIGHT WRIST TODAY AND IS HAVING PAIN  AND WANTS PAIN MEDICATION. ADVISED PT TO CALL SPECIALIST THAT INJECTED WRIST TODAY. [place].

## 2014-01-13 ENCOUNTER — Other Ambulatory Visit: Payer: Self-pay | Admitting: Nurse Practitioner

## 2014-01-17 ENCOUNTER — Other Ambulatory Visit: Payer: Self-pay | Admitting: Nurse Practitioner

## 2014-01-17 ENCOUNTER — Telehealth: Payer: Self-pay | Admitting: Nurse Practitioner

## 2014-01-17 NOTE — Telephone Encounter (Signed)
Patient has been dismissed since November- cannot fill at this time

## 2014-01-17 NOTE — Telephone Encounter (Signed)
Again has been dismissed and cannot do refill at this time

## 2014-01-18 NOTE — Telephone Encounter (Signed)
Please call in xanax with 0 refills 

## 2014-01-18 NOTE — Telephone Encounter (Signed)
Called in.

## 2014-01-18 NOTE — Telephone Encounter (Signed)
Per cathy ok to refill cause patient is making payments

## 2014-01-18 NOTE — Telephone Encounter (Signed)
Per cathy she is making payments and ok to refill if you wish to approve

## 2014-01-18 NOTE — Telephone Encounter (Signed)
Already addressed to have called in under med refills

## 2014-01-24 ENCOUNTER — Telehealth: Payer: Self-pay | Admitting: Nurse Practitioner

## 2014-01-25 ENCOUNTER — Telehealth: Payer: Self-pay | Admitting: Nurse Practitioner

## 2014-01-25 NOTE — Telephone Encounter (Signed)
Patient transferred to [facility] to discuss bill. She is to call Dr.Wert for medication change per [name].

## 2014-01-25 NOTE — Telephone Encounter (Signed)
Find out where paper work is and exactly what she is taking

## 2014-01-25 NOTE — Telephone Encounter (Signed)
Jennifer Mullins said she has taken her off hold and we may take care of

## 2014-01-25 NOTE — Telephone Encounter (Signed)
Patient has been dismissed- will have to get from Dr. Rudy Jew

## 2014-01-26 NOTE — Telephone Encounter (Signed)
Patient aware and will bring form by and pick up some bystolic 10

## 2014-01-26 NOTE — Telephone Encounter (Signed)
It appears she if she is not dismissed anymore? Please address

## 2014-01-27 NOTE — Telephone Encounter (Signed)
Samples were put up front yesterday

## 2014-02-02 ENCOUNTER — Telehealth: Payer: Self-pay | Admitting: Nurse Practitioner

## 2014-02-02 NOTE — Telephone Encounter (Signed)
Will do in AM.

## 2014-02-02 NOTE — Telephone Encounter (Signed)
Paper on your desk just needs you to write the RX to fax

## 2014-02-07 ENCOUNTER — Other Ambulatory Visit: Payer: Self-pay | Admitting: Nurse Practitioner

## 2014-02-14 ENCOUNTER — Other Ambulatory Visit: Payer: Self-pay | Admitting: Nurse Practitioner

## 2014-02-14 DIAGNOSIS — I10 Essential (primary) hypertension: Secondary | ICD-10-CM

## 2014-02-15 NOTE — Telephone Encounter (Signed)
Called in.

## 2014-02-15 NOTE — Telephone Encounter (Signed)
No more refills without being seen.Please call in xaxax with 0 refills

## 2014-02-15 NOTE — Telephone Encounter (Signed)
Last ov 12/14. Last refill 01/18/14. If approved call in [facility]- [phone]

## 2014-02-16 MED ORDER — NEBIVOLOL HCL 5 MG PO TABS: 5.0000 mg | ORAL_TABLET | Freq: Every day | ORAL | Status: DC

## 2014-02-16 NOTE — Addendum Note (Signed)
Addended by: Jamelle Haring on: 02/16/2014 08:52 AM   Modules accepted: Orders

## 2014-02-24 ENCOUNTER — Ambulatory Visit: Payer: BC Managed Care – PPO | Admitting: Nurse Practitioner

## 2014-03-15 ENCOUNTER — Other Ambulatory Visit: Payer: Self-pay | Admitting: Nurse Practitioner

## 2014-03-15 ENCOUNTER — Telehealth: Payer: Self-pay | Admitting: Nurse Practitioner

## 2014-03-15 NOTE — Telephone Encounter (Signed)
appt made. Pt will bring forms with her to appt.

## 2014-03-23 ENCOUNTER — Other Ambulatory Visit: Payer: Self-pay | Admitting: Nurse Practitioner

## 2014-03-24 NOTE — Telephone Encounter (Signed)
rx called in to pharmacy and detailed message left that she must be seen for any further refills

## 2014-03-24 NOTE — Telephone Encounter (Signed)
ntbs for future refills Please call in xanax with 0 refills

## 2014-03-31 ENCOUNTER — Telehealth: Payer: Self-pay | Admitting: Nurse Practitioner

## 2014-03-31 NOTE — Telephone Encounter (Signed)
Rescheduled. Patient aware.

## 2014-04-01 ENCOUNTER — Ambulatory Visit: Payer: BC Managed Care – PPO | Admitting: Nurse Practitioner

## 2014-05-04 ENCOUNTER — Ambulatory Visit: Payer: BC Managed Care – PPO | Admitting: Nurse Practitioner

## 2014-05-04 ENCOUNTER — Telehealth: Payer: Self-pay | Admitting: Nurse Practitioner

## 2014-05-04 NOTE — Telephone Encounter (Signed)
appt scheduled

## 2014-05-05 ENCOUNTER — Other Ambulatory Visit: Payer: Self-pay | Admitting: Nurse Practitioner

## 2014-05-05 ENCOUNTER — Telehealth: Payer: Self-pay | Admitting: Nurse Practitioner

## 2014-05-05 DIAGNOSIS — I10 Essential (primary) hypertension: Secondary | ICD-10-CM

## 2014-05-05 NOTE — Telephone Encounter (Signed)
No NTBS- was told at last request

## 2014-05-05 NOTE — Telephone Encounter (Signed)
Left message for patient to return call.

## 2014-05-05 NOTE — Telephone Encounter (Signed)
What med- had appointment yesterday and no showed

## 2014-05-05 NOTE — Telephone Encounter (Signed)
Patient had to reschedule appt to 05/19/14.  She is requesting alprazolam. Made her aware that last prescription said she would have to be seen for further refills. Will you authorize one refill?

## 2014-05-06 MED ORDER — NEBIVOLOL HCL 5 MG PO TABS: 5.0000 mg | ORAL_TABLET | Freq: Every day | ORAL | Status: DC

## 2014-05-06 NOTE — Telephone Encounter (Signed)
NO patient was told at last request NTBS for refill then she no showed for appointment- will have to wait until seenpp

## 2014-05-06 NOTE — Telephone Encounter (Signed)
Patient wants to know if you will give her some until her appointment? I told her you would probably not do it, but she insisted that I asked I offered sooner appointment and she refused. She also had an appointment this week and NS

## 2014-05-07 NOTE — Telephone Encounter (Signed)
Patient aware.

## 2014-05-07 NOTE — Telephone Encounter (Signed)
Has appt 05/19/14, last filled 03/24/14. Call into [facility] [phone]

## 2014-05-09 NOTE — Telephone Encounter (Signed)
Patient aware no refill without being seen

## 2014-05-18 ENCOUNTER — Ambulatory Visit (INDEPENDENT_AMBULATORY_CARE_PROVIDER_SITE_OTHER): Payer: BC Managed Care – PPO | Admitting: Nurse Practitioner

## 2014-05-18 ENCOUNTER — Encounter: Payer: Self-pay | Admitting: Nurse Practitioner

## 2014-05-18 VITALS — BP 140/80 | HR 105 | Temp 98.9°F | Ht 62.0 in | Wt 152.4 lb

## 2014-05-18 DIAGNOSIS — J45909 Unspecified asthma, uncomplicated: Secondary | ICD-10-CM

## 2014-05-18 DIAGNOSIS — F411 Generalized anxiety disorder: Secondary | ICD-10-CM

## 2014-05-18 DIAGNOSIS — E8779 Other fluid overload: Secondary | ICD-10-CM

## 2014-05-18 DIAGNOSIS — E119 Type 2 diabetes mellitus without complications: Secondary | ICD-10-CM

## 2014-05-18 DIAGNOSIS — J329 Chronic sinusitis, unspecified: Secondary | ICD-10-CM

## 2014-05-18 DIAGNOSIS — K219 Gastro-esophageal reflux disease without esophagitis: Secondary | ICD-10-CM

## 2014-05-18 DIAGNOSIS — R609 Edema, unspecified: Secondary | ICD-10-CM

## 2014-05-18 DIAGNOSIS — I10 Essential (primary) hypertension: Secondary | ICD-10-CM

## 2014-05-18 DIAGNOSIS — E785 Hyperlipidemia, unspecified: Secondary | ICD-10-CM

## 2014-05-18 LAB — POCT GLYCOSYLATED HEMOGLOBIN (HGB A1C): Hemoglobin A1C: 5.9

## 2014-05-18 MED ORDER — BUDESONIDE-FORMOTEROL FUMARATE 160-4.5 MCG/ACT IN AERO: INHALATION_SPRAY | RESPIRATORY_TRACT | Status: DC

## 2014-05-18 MED ORDER — ALPRAZOLAM 0.5 MG PO TABS: ORAL_TABLET | ORAL | Status: DC

## 2014-05-18 MED ORDER — MONTELUKAST SODIUM 10 MG PO TABS: 10.0000 mg | ORAL_TABLET | Freq: Every day | ORAL | Status: DC

## 2014-05-18 MED ORDER — FAMOTIDINE 20 MG PO TABS: ORAL_TABLET | ORAL | Status: DC

## 2014-05-18 MED ORDER — FUROSEMIDE 20 MG PO TABS: 20.0000 mg | ORAL_TABLET | Freq: Every day | ORAL | Status: DC

## 2014-05-18 MED ORDER — METFORMIN HCL 500 MG PO TABS: 500.0000 mg | ORAL_TABLET | Freq: Every day | ORAL | Status: DC

## 2014-05-18 MED ORDER — FLUTICASONE PROPIONATE 50 MCG/ACT NA SUSP: 2.0000 | Freq: Every day | NASAL | Status: DC

## 2014-05-18 MED ORDER — PRAVASTATIN SODIUM 20 MG PO TABS: 20.0000 mg | ORAL_TABLET | Freq: Every day | ORAL | Status: DC

## 2014-05-18 MED ORDER — PANTOPRAZOLE SODIUM 40 MG PO TBEC: DELAYED_RELEASE_TABLET | ORAL | Status: DC

## 2014-05-18 NOTE — Addendum Note (Signed)
Addended by: Pollyann Kennedy F on: 05/18/2014 03:57 PM   Modules accepted: Orders

## 2014-05-18 NOTE — Patient Instructions (Signed)

## 2014-05-18 NOTE — Progress Notes (Addendum)
Subjective:    Patient ID: Jennifer Mullins, female    DOB: Dec 25, 1970, 43 y.o.   MRN: [id number]  Diabetes She presents for her follow-up diabetic visit. She has type 2 diabetes mellitus. No MedicAlert identification noted. Her disease course has been stable. Pertinent negatives for diabetes include no chest pain, no foot paresthesias, no polydipsia, no polyphagia, no polyuria, no weakness and no weight loss. Symptoms are stable. Current diabetic treatment includes oral agent (monotherapy). She is compliant with treatment most of the time. Her weight is stable. When asked about meal planning, she reported none. She has not had a previous visit with a dietician. She rarely participates in exercise. Her breakfast blood glucose is taken between 8-9 am. Her breakfast blood glucose range is generally 110-130 mg/dl. Her dinner blood glucose is taken between 6-7 pm. Her dinner blood glucose range is generally 110-130 mg/dl. Her highest blood glucose is 180-200 mg/dl. Her overall blood glucose range is 130-140 mg/dl. She does not see a podiatrist.Eye exam is not current.  Hypertension This is a chronic problem. The current episode started more than 1 year ago. The problem is controlled. Pertinent negatives include no chest pain. Risk factors for coronary artery disease include diabetes mellitus, dyslipidemia, family history, post-menopausal state and stress. Past treatments include beta blockers. The current treatment provides moderate improvement. Compliance problems include diet and exercise.   Hyperlipidemia This is a chronic problem. The current episode started more than 1 year ago. The problem is uncontrolled. Recent lipid tests were reviewed and are high. Factors aggravating her hyperlipidemia include smoking. Pertinent negatives include no chest pain. Current antihyperlipidemic treatment includes statins (patient has not taking in about a month because she ran out of meds.). The current treatment provides  mild improvement of lipids. Compliance problems include adherence to diet and adherence to exercise.  Risk factors for coronary artery disease include stress, hypertension, dyslipidemia, family history and diabetes mellitus.  GERD Currently on pepcid and protonix- combination keeps her symptoms under control Asthma/ allergies Symbicort, flonase and singulair keeps her under control- occasional wheezes but for the most art doing well. GAD Cannot function without her xanax- takes several times a day  * nodule on left anterior shin seems to be getting larger   Review of Systems  Constitutional: Negative for weight loss.  Cardiovascular: Negative for chest pain.  Endocrine: Negative for polydipsia, polyphagia and polyuria.  Neurological: Negative for weakness.       Objective:   Physical Exam  Constitutional: She is oriented to person, place, and time. She appears well-developed and well-nourished.  HENT:  Nose: Nose normal.  Mouth/Throat: Oropharynx is clear and moist.  Eyes: EOM are normal.  Neck: Trachea normal, normal range of motion and full passive range of motion without pain. Neck supple. No JVD present. Carotid bruit is not present. No thyromegaly present.  Cardiovascular: Normal rate, regular rhythm, normal heart sounds and intact distal pulses.  Exam reveals no gallop and no friction rub.   No murmur heard. Pulmonary/Chest: Effort normal and breath sounds normal.  Abdominal: Soft. Bowel sounds are normal. She exhibits no distension and no mass. There is no tenderness.  Musculoskeletal: Normal range of motion.  Lymphadenopathy:    She has no cervical adenopathy.  Neurological: She is alert and oriented to person, place, and time. She has normal reflexes.  Skin: Skin is warm and dry.  nontender 3cm soft nodule left shin  Psychiatric: She has a normal mood and affect. Her behavior is normal.  Judgment and thought content normal.   BP 140/80  Pulse 105  Temp(Src) 98.9 F  (37.2 C) (Oral)  Ht _0  (1.575 m)  Wt 152 lb 6.4 oz (69.128 kg)  BMI 27.87 kg/m2        Assessment & Plan:   1. Type II or unspecified type diabetes mellitus without mention of complication, not stated as uncontrolled   2. GAD (generalized anxiety disorder)   3. Gastroesophageal reflux disease without esophagitis   4. Essential hypertension   5. Extrinsic asthma, unspecified   6. Body fluid retention   7. Chronic sinusitis, unspecified location   8. Other and unspecified hyperlipidemia    Orders Placed This Encounter  Procedures  . CMP14+EGFR  . NMR, lipoprofile  . Thyroid Panel With TSH  . Thyroid Panel With TSH  . POCT glycosylated hemoglobin (Hb A1C)   Meds ordered this encounter  Medications  . DISCONTD: HYDROcodone-acetaminophen (NORCO) 10-325 MG per tablet    Sig:   . DISCONTD: fluticasone (FLONASE) 50 MCG/ACT nasal spray    Sig:   . DISCONTD: montelukast (SINGULAIR) 10 MG tablet    Sig:   . furosemide (LASIX) 20 MG tablet    Sig: Take 1 tablet (20 mg total) by mouth daily.    Dispense:  30 tablet    Refill:  5    Order Specific Question:  Supervising Provider    Answer:  Chipper Herb [[id number]]  . metFORMIN (GLUCOPHAGE) 500 MG tablet    Sig: Take 1 tablet (500 mg total) by mouth daily with breakfast.    Dispense:  30 tablet    Refill:  5    Order Specific Question:  Supervising Provider    Answer:  Chipper Herb [[id number]]  . pravastatin (PRAVACHOL) 20 MG tablet    Sig: Take 1 tablet (20 mg total) by mouth daily.    Dispense:  30 tablet    Refill:  5    Order Specific Question:  Supervising Provider    Answer:  Chipper Herb [[id number]]  . pantoprazole (PROTONIX) 40 MG tablet    Sig: TAKE ONE TABLET BY MOUTH ONCE DAILY    Dispense:  30 tablet    Refill:  5    Order Specific Question:  Supervising Provider    Answer:  Chipper Herb [[id number]]  . montelukast (SINGULAIR) 10 MG tablet    Sig: Take 1 tablet (10 mg total) by mouth at bedtime.    Dispense:  30  tablet    Refill:  5    Order Specific Question:  Supervising Provider    Answer:  Chipper Herb [[id number]]  . fluticasone (FLONASE) 50 MCG/ACT nasal spray    Sig: Place 2 sprays into both nostrils daily.    Dispense:  16 g    Refill:  3    Order Specific Question:  Supervising Provider    Answer:  Chipper Herb [[id number]]  . famotidine (PEPCID) 20 MG tablet    Sig: TAKE ONE TABLET BY MOUTH AT BEDTIME    Dispense:  30 tablet    Refill:  5    Order Specific Question:  Supervising Provider    Answer:  Chipper Herb [[id number]]  . budesonide-formoterol (SYMBICORT) 160-4.5 MCG/ACT inhaler    Sig: Take 2 puffs first thing in am and then another 2 puffs about 12 hours later.    Dispense:  1 Inhaler    Refill:  11    Order Specific Question:  Supervising  Provider    Answer:  Chipper Herb [[id number]]  . DISCONTD: ALPRAZolam (XANAX) 0.5 MG tablet    Sig: TAKE ONE TABLET BY MOUTH TWICE DAILY AS NEEDED, NO MORE REFILLS UNTIL SEEN    Dispense:  60 tablet    Refill:  0    Order Specific Question:  Supervising Provider    Answer:  Chipper Herb [[id number]]  . ALPRAZolam (XANAX) 0.5 MG tablet    Sig: TAKE ONE TABLET BY MOUTH TWICE DAILY AS NEEDED, NO MORE REFILLS UNTIL SEEN    Dispense:  60 tablet    Refill:  1    Order Specific Question:  Supervising Provider    Answer:  Chipper Herb [[id number]]    Labs pending Health maintenance reviewed Diet and exercise encouraged Continue all meds Follow up  In 3 months   [place], FNP

## 2014-05-19 ENCOUNTER — Telehealth: Payer: Self-pay | Admitting: Nurse Practitioner

## 2014-05-19 ENCOUNTER — Other Ambulatory Visit: Payer: Self-pay | Admitting: Nurse Practitioner

## 2014-05-19 ENCOUNTER — Ambulatory Visit: Payer: BC Managed Care – PPO | Admitting: Nurse Practitioner

## 2014-05-19 LAB — CMP14+EGFR
ALT: 25 IU/L (ref 0–32)
AST: 25 IU/L (ref 0–40)
Albumin/Globulin Ratio: 2 (ref 1.1–2.5)
Albumin: 4.5 g/dL (ref 3.5–5.5)
Alkaline Phosphatase: 62 IU/L (ref 39–117)
BUN/Creatinine Ratio: 9 (ref 9–23)
BUN: 7 mg/dL (ref 6–24)
CO2: 19 mmol/L (ref 18–29)
Calcium: 8.9 mg/dL (ref 8.7–10.2)
Chloride: 104 mmol/L (ref 97–108)
Creatinine, Ser: 0.8 mg/dL (ref 0.57–1.00)
GFR calc Af Amer: 103 mL/min/{1.73_m2} (ref >59)
GFR calc non Af Amer: 89 mL/min/{1.73_m2} (ref >59)
Globulin, Total: 2.2 g/dL (ref 1.5–4.5)
Glucose: 112 mg/dL — ABNORMAL HIGH (ref 65–99)
Potassium: 3.5 mmol/L (ref 3.5–5.2)
Sodium: 141 mmol/L (ref 134–144)
Total Bilirubin: 0.2 mg/dL (ref 0.0–1.2)
Total Protein: 6.7 g/dL (ref 6.0–8.5)

## 2014-05-19 LAB — NMR, LIPOPROFILE
Cholesterol: 223 mg/dL — ABNORMAL HIGH (ref 100–199)
HDL Cholesterol by NMR: 46 mg/dL (ref >39)
HDL Particle Number: 32.8 umol/L (ref >=30.5)
LDL Particle Number: 1686 nmol/L — ABNORMAL HIGH (ref <1000)
LDL Size: 21 nm (ref >20.5)
LDLC SERPL CALC-MCNC: 149 mg/dL — ABNORMAL HIGH (ref 0–99)
LP-IR Score: 56 — ABNORMAL HIGH (ref <=45)
Small LDL Particle Number: 660 nmol/L — ABNORMAL HIGH (ref <=527)
Triglycerides by NMR: 142 mg/dL (ref 0–149)

## 2014-05-19 LAB — THYROID PANEL WITH TSH
Free Thyroxine Index: 2.9 (ref 1.2–4.9)
T3 Uptake Ratio: 24 % (ref 24–39)
T4, Total: 12.2 ug/dL — ABNORMAL HIGH (ref 4.5–12.0)
TSH: 1.12 u[IU]/mL (ref 0.450–4.500)

## 2014-05-19 MED ORDER — AZITHROMYCIN 250 MG PO TABS: ORAL_TABLET | ORAL | Status: DC

## 2014-05-19 NOTE — Telephone Encounter (Signed)
Patient aware mmm sent in RX

## 2014-06-10 ENCOUNTER — Telehealth: Payer: Self-pay | Admitting: Nurse Practitioner

## 2014-06-13 ENCOUNTER — Other Ambulatory Visit: Payer: Self-pay | Admitting: Nurse Practitioner

## 2014-06-13 MED ORDER — FLUCONAZOLE 150 MG PO TABS: 150.0000 mg | ORAL_TABLET | Freq: Once | ORAL | Status: DC

## 2014-07-18 ENCOUNTER — Other Ambulatory Visit: Payer: Self-pay | Admitting: *Deleted

## 2014-07-18 DIAGNOSIS — F411 Generalized anxiety disorder: Secondary | ICD-10-CM

## 2014-07-18 MED ORDER — ALPRAZOLAM 0.5 MG PO TABS: ORAL_TABLET | ORAL | Status: DC

## 2014-07-18 NOTE — Telephone Encounter (Signed)
Last ov 05/18/14. Last refill 06/19/14. If approved call to [facility]. [phone]

## 2014-07-18 NOTE — Telephone Encounter (Signed)
Called in.

## 2014-07-18 NOTE — Telephone Encounter (Signed)
Please call in xanax with 1 refills 

## 2014-07-27 ENCOUNTER — Telehealth: Payer: Self-pay | Admitting: Nurse Practitioner

## 2014-07-27 NOTE — Telephone Encounter (Signed)
ntbs

## 2014-07-28 ENCOUNTER — Telehealth: Payer: Self-pay | Admitting: Nurse Practitioner

## 2014-07-28 NOTE — Telephone Encounter (Signed)
Aware,ntbs for sinus problem.

## 2014-08-11 ENCOUNTER — Other Ambulatory Visit: Payer: Self-pay | Admitting: Nurse Practitioner

## 2014-08-26 ENCOUNTER — Ambulatory Visit: Payer: BC Managed Care – PPO | Admitting: Nurse Practitioner

## 2014-09-02 LAB — HM DIABETES EYE EXAM

## 2014-09-05 ENCOUNTER — Encounter: Payer: Self-pay | Admitting: Family Medicine

## 2014-09-05 ENCOUNTER — Encounter (INDEPENDENT_AMBULATORY_CARE_PROVIDER_SITE_OTHER): Payer: Self-pay

## 2014-09-05 ENCOUNTER — Ambulatory Visit (INDEPENDENT_AMBULATORY_CARE_PROVIDER_SITE_OTHER): Payer: BC Managed Care – PPO | Admitting: Family Medicine

## 2014-09-05 VITALS — BP 130/76 | HR 66 | Temp 98.7°F | Ht 62.0 in | Wt 145.0 lb

## 2014-09-05 DIAGNOSIS — J209 Acute bronchitis, unspecified: Secondary | ICD-10-CM

## 2014-09-05 MED ORDER — ALBUTEROL SULFATE (2.5 MG/3ML) 0.083% IN NEBU: 2.5000 mg | INHALATION_SOLUTION | Freq: Four times a day (QID) | RESPIRATORY_TRACT | Status: DC | PRN

## 2014-09-05 MED ORDER — HYDROCODONE-HOMATROPINE 5-1.5 MG/5ML PO SYRP: 5.0000 mL | ORAL_SOLUTION | Freq: Three times a day (TID) | ORAL | Status: DC | PRN

## 2014-09-05 MED ORDER — FLUCONAZOLE 150 MG PO TABS: 150.0000 mg | ORAL_TABLET | Freq: Once | ORAL | Status: DC

## 2014-09-05 MED ORDER — PREDNISONE 20 MG PO TABS: 20.0000 mg | ORAL_TABLET | Freq: Every day | ORAL | Status: DC

## 2014-09-05 MED ORDER — AMOXICILLIN-POT CLAVULANATE 875-125 MG PO TABS: 1.0000 | ORAL_TABLET | Freq: Two times a day (BID) | ORAL | Status: DC

## 2014-09-05 NOTE — Progress Notes (Signed)
   Subjective:    Patient ID: Jennifer Mullins, female    DOB: 01-10-1971, 43 y.o.   MRN: [id number]  HPI This 42 y.o. female presents for evaluation of c/o uri sx's.   Review of Systems    No chest pain, SOB, HA, dizziness, vision change, N/V, diarrhea, constipation, dysuria, urinary urgency or frequency, myalgias, arthralgias or rash.  Objective:   Physical Exam Vital signs noted  Well developed well nourished female.  HEENT - Head atraumatic Normocephalic                Eyes - PERRLA, Conjuctiva - clear Sclera- Clear EOMI                Ears - EAC's Wnl TM's Wnl Gross Hearing WNL                Nose - Nares patent                 Throat - oropharanx wnl Respiratory - Lungs CTA bilateral Cardiac - RRR S1 and S2 without murmur GI - Abdomen soft Nontender and bowel sounds active x 4 Extremities - No edema. Neuro - Grossly intact.       Assessment & Plan:  Acute bronchitis, unspecified organism - Plan: amoxicillin-clavulanate (AUGMENTIN) 875-125 MG per tablet, HYDROcodone-homatropine (HYCODAN) 5-1.5 MG/5ML syrup, predniSONE (DELTASONE) 20 MG tablet, albuterol (PROVENTIL) (2.5 MG/3ML) 0.083% nebulizer solution  Jennifer Penner FNP

## 2014-09-09 ENCOUNTER — Other Ambulatory Visit: Payer: Self-pay | Admitting: Nurse Practitioner

## 2014-09-09 ENCOUNTER — Other Ambulatory Visit: Payer: Self-pay

## 2014-09-09 DIAGNOSIS — F411 Generalized anxiety disorder: Secondary | ICD-10-CM

## 2014-09-09 MED ORDER — ALPRAZOLAM 0.5 MG PO TABS: ORAL_TABLET | ORAL | Status: DC

## 2014-09-09 NOTE — Telephone Encounter (Signed)
Last seen 09/05/14  [place]  IF approved route to nurse to call into [facility]

## 2014-09-10 ENCOUNTER — Telehealth: Payer: Self-pay | Admitting: Nurse Practitioner

## 2014-09-12 ENCOUNTER — Telehealth: Payer: Self-pay | Admitting: Nurse Practitioner

## 2014-09-12 NOTE — Telephone Encounter (Signed)
Script called in

## 2014-09-12 NOTE — Telephone Encounter (Signed)
Please call in xanax with 0 refills 

## 2014-09-12 NOTE — Telephone Encounter (Signed)
Patient last seen in office on 09-05-14. Rx last filled on 08-14-14 for #[phone]. Please advise. If approved please route to pool B so nurse can phone in to pharmacy

## 2014-09-13 NOTE — Telephone Encounter (Signed)
Done 09/12/14

## 2014-09-16 ENCOUNTER — Telehealth: Payer: Self-pay | Admitting: Family Medicine

## 2014-09-16 ENCOUNTER — Other Ambulatory Visit: Payer: Self-pay | Admitting: Family Medicine

## 2014-09-16 NOTE — Telephone Encounter (Signed)
NTBS.

## 2014-09-16 NOTE — Telephone Encounter (Signed)
Pt notified ntbs and refused appt for today. Will call back if decides to be seen.

## 2014-09-22 ENCOUNTER — Telehealth: Payer: Self-pay | Admitting: Nurse Practitioner

## 2014-09-22 NOTE — Telephone Encounter (Signed)
Pt given appt for 10/07/14 with mmm at 9:45

## 2014-09-23 ENCOUNTER — Encounter: Payer: Self-pay | Admitting: *Deleted

## 2014-09-23 ENCOUNTER — Ambulatory Visit: Payer: BC Managed Care – PPO | Admitting: Nurse Practitioner

## 2014-10-05 ENCOUNTER — Telehealth: Payer: Self-pay | Admitting: Nurse Practitioner

## 2014-10-05 NOTE — Telephone Encounter (Signed)
Pt given appt with [name] 12/14 @ 1:30

## 2014-10-07 ENCOUNTER — Ambulatory Visit: Payer: Self-pay | Admitting: Nurse Practitioner

## 2014-10-31 ENCOUNTER — Encounter: Payer: Self-pay | Admitting: Nurse Practitioner

## 2014-10-31 ENCOUNTER — Ambulatory Visit (INDEPENDENT_AMBULATORY_CARE_PROVIDER_SITE_OTHER): Payer: BC Managed Care – PPO | Admitting: Nurse Practitioner

## 2014-10-31 VITALS — BP 126/79 | HR 83 | Temp 98.3°F | Ht 62.0 in | Wt 144.0 lb

## 2014-10-31 DIAGNOSIS — E042 Nontoxic multinodular goiter: Secondary | ICD-10-CM

## 2014-10-31 DIAGNOSIS — F411 Generalized anxiety disorder: Secondary | ICD-10-CM

## 2014-10-31 DIAGNOSIS — E1165 Type 2 diabetes mellitus with hyperglycemia: Secondary | ICD-10-CM

## 2014-10-31 DIAGNOSIS — I10 Essential (primary) hypertension: Secondary | ICD-10-CM

## 2014-10-31 DIAGNOSIS — R7309 Other abnormal glucose: Secondary | ICD-10-CM

## 2014-10-31 DIAGNOSIS — J4522 Mild intermittent asthma with status asthmaticus: Secondary | ICD-10-CM

## 2014-10-31 DIAGNOSIS — R350 Frequency of micturition: Secondary | ICD-10-CM

## 2014-10-31 DIAGNOSIS — K219 Gastro-esophageal reflux disease without esophagitis: Secondary | ICD-10-CM

## 2014-10-31 DIAGNOSIS — E282 Polycystic ovarian syndrome: Secondary | ICD-10-CM

## 2014-10-31 DIAGNOSIS — E785 Hyperlipidemia, unspecified: Secondary | ICD-10-CM

## 2014-10-31 DIAGNOSIS — E0965 Drug or chemical induced diabetes mellitus with hyperglycemia: Secondary | ICD-10-CM

## 2014-10-31 DIAGNOSIS — R7302 Impaired glucose tolerance (oral): Secondary | ICD-10-CM

## 2014-10-31 LAB — POCT UA - MICROSCOPIC ONLY
Casts, Ur, LPF, POC: NEGATIVE
Crystals, Ur, HPF, POC: NEGATIVE
Mucus, UA: NEGATIVE
Yeast, UA: NEGATIVE

## 2014-10-31 LAB — POCT URINALYSIS DIPSTICK
Bilirubin, UA: NEGATIVE
Blood, UA: NEGATIVE
Glucose, UA: NEGATIVE
Ketones, UA: NEGATIVE
Leukocytes, UA: NEGATIVE
Nitrite, UA: NEGATIVE
Protein, UA: NEGATIVE
Spec Grav, UA: 1.01
Urobilinogen, UA: NEGATIVE
pH, UA: 7.5

## 2014-10-31 LAB — POCT GLYCOSYLATED HEMOGLOBIN (HGB A1C): Hemoglobin A1C: 5.8

## 2014-10-31 MED ORDER — METFORMIN HCL 500 MG PO TABS: 500.0000 mg | ORAL_TABLET | Freq: Every day | ORAL | Status: DC

## 2014-10-31 MED ORDER — PRAVASTATIN SODIUM 20 MG PO TABS: 20.0000 mg | ORAL_TABLET | Freq: Every day | ORAL | Status: DC

## 2014-10-31 MED ORDER — ALPRAZOLAM 0.5 MG PO TABS: 0.5000 mg | ORAL_TABLET | Freq: Two times a day (BID) | ORAL | Status: DC

## 2014-10-31 NOTE — Patient Instructions (Signed)

## 2014-10-31 NOTE — Progress Notes (Signed)
Subjective:    Patient ID: Jennifer Mullins, female    DOB: March 03, 1971, 43 y.o.   MRN: [id number]   Patient here today for follow up of chronic medical problems. Patient says that her blood sugars have been going up so she has increased her metformin to 1 1/2 a day. Needs spirometry test for possible COPD   Diabetes She presents for her follow-up diabetic visit. She has type 2 diabetes mellitus. No MedicAlert identification noted. Her disease course has been stable. There are no hypoglycemic associated symptoms. Pertinent negatives for diabetes include no chest pain, no polydipsia, no polyphagia, no polyuria and no weakness. There are no hypoglycemic complications. There are no diabetic complications. Risk factors for coronary artery disease include dyslipidemia and hypertension. Her weight is stable. She is following a diabetic diet. When asked about meal planning, she reported none. She has not had a previous visit with a dietitian. She rarely participates in exercise. Her breakfast blood glucose is taken between 8-9 am. Her breakfast blood glucose range is generally 130-140 mg/dl. Her overall blood glucose range is 130-140 mg/dl. An ACE inhibitor/angiotensin II receptor blocker is being taken. She does not see a podiatrist.Eye exam is not current.  Hypertension This is a chronic problem. The current episode started more than 1 year ago. The problem is controlled. Pertinent negatives include no chest pain. Risk factors for coronary artery disease include diabetes mellitus, dyslipidemia and post-menopausal state. Past treatments include beta blockers. The current treatment provides moderate improvement. Compliance problems include diet and exercise.   Hyperlipidemia This is a chronic problem. The current episode started more than 1 year ago. The problem is controlled. Recent lipid tests were reviewed and are high. Exacerbating diseases include diabetes. She has no history of hypothyroidism or obesity.  Pertinent negatives include no chest pain. She is currently on no antihyperlipidemic treatment. Risk factors for coronary artery disease include diabetes mellitus, hypertension and post-menopausal.  GERD Currently on pepcid and protonix- combination keeps her symptoms under control Asthma/ allergies Symbicort, flonase and singulair keeps her under control- occasional wheezes but for the most art doing well. GAD Cannot function without her xanax- takes several times a day     Review of Systems  Constitutional: Negative.   Cardiovascular: Negative for chest pain.  Endocrine: Negative for polydipsia, polyphagia and polyuria.  Neurological: Negative for weakness.  Psychiatric/Behavioral: Negative.   All other systems reviewed and are negative.      Objective:   Physical Exam  Constitutional: She is oriented to person, place, and time. She appears well-developed and well-nourished.  HENT:  Nose: Nose normal.  Mouth/Throat: Oropharynx is clear and moist.  Eyes: EOM are normal.  Neck: Trachea normal, normal range of motion and full passive range of motion without pain. Neck supple. No JVD present. Carotid bruit is not present. No thyromegaly present.  Cardiovascular: Normal rate, regular rhythm, normal heart sounds and intact distal pulses.  Exam reveals no gallop and no friction rub.   No murmur heard. Pulmonary/Chest: Effort normal and breath sounds normal.  Abdominal: Soft. Bowel sounds are normal. She exhibits no distension and no mass. There is no tenderness.  Musculoskeletal: Normal range of motion.  Lymphadenopathy:    She has no cervical adenopathy.  Neurological: She is alert and oriented to person, place, and time. She has normal reflexes.  Skin: Skin is warm and dry.  Psychiatric: She has a normal mood and affect. Her behavior is normal. Judgment and thought content normal.   BP  126/79 mmHg  Pulse 83  Temp(Src) 98.3 F (36.8 C) (Oral)  Ht 5\' 2"  (1.575 m)  Wt 144 lb  (65.318 kg)  BMI 26.33 kg/m2  Results for orders placed or performed in visit on 10/31/14  POCT glycosylated hemoglobin (Hb A1C)  Result Value Ref Range   Hemoglobin A1C 5.8   POCT UA - Microscopic Only  Result Value Ref Range   WBC, Ur, HPF, POC occ    RBC, urine, microscopic occ    Bacteria, U Microscopic few    Mucus, UA neg    Epithelial cells, urine per micros occ    Crystals, Ur, HPF, POC neg    Casts, Ur, LPF, POC neg    Yeast, UA neg   POCT urinalysis dipstick  Result Value Ref Range   Color, UA clear    Clarity, UA clear    Glucose, UA neg    Bilirubin, UA neg    Ketones, UA neg    Spec Grav, UA 1.010    Blood, UA neg    pH, UA 7.5    Protein, UA neg    Urobilinogen, UA negative    Nitrite, UA neg    Leukocytes, UA Negative             Assessment & Plan:   1. Nontoxic multinodular goiter   2. Impaired glucose tolerance test   3. Frequent urination   4. Type 2 diabetes mellitus with hyperglycemia   5. Hyperlipidemia LDL goal <100   6. GAD (generalized anxiety disorder)   7. PCOS (polycystic ovarian syndrome)   8. Essential hypertension   9. 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 Extrinsic asthma, mild intermittent, with status asthmaticus    Meds ordered this encounter  Medications  . Olopatadine HCl 0.6 % SOLN    Sig: Place into the nose.  . cyproheptadine (PERIACTIN) 4 MG tablet    Sig: Take 4 mg by mouth at bedtime.  . bimatoprost (LUMIGAN) 0.01 % SOLN    Sig: 1 drop at bedtime.  . metFORMIN (GLUCOPHAGE) 500 MG tablet    Sig: Take 1 tablet (500 mg total) by mouth daily with breakfast.    Dispense:  30 tablet    Refill:  5    Order Specific Question:  Supervising Provider    Answer:  Chipper Herb [[id number]]  . pravastatin (PRAVACHOL) 20 MG tablet    Sig: Take 1 tablet (20 mg total) by mouth daily.    Dispense:  30 tablet    Refill:  5    Order Specific  Question:  Supervising Provider    Answer:  Chipper Herb [[id number]]  . ALPRAZolam (XANAX) 0.5 MG tablet    Sig: Take 1 tablet (0.5 mg total) by mouth 2 (two) times daily.    Dispense:  60 tablet    Refill:  1    Do not fill till 11/12/14    Order Specific Question:  Supervising Provider    Answer:  Chipper Herb [[id number]]   Orders Placed This Encounter  Procedures  . TSH  . T4, Free  . T3, Free  . Thyroid Peroxidase Antibody  . Thyroglobulin Antibody  . Testosterone,Free and Total  . POCT glycosylated hemoglobin (Hb A1C)  . POCT UA - Microscopic Only  . POCT urinalysis dipstick  . [place] maintenance reviewed Labs pending SPirometry - poor effort so difficult  to diagnose COPD RTO in 3 month  [place], FNP

## 2014-11-01 LAB — ANEMIA PROFILE B
Basophils Absolute: 0 10*3/uL (ref 0.0–0.2)
Basos: 0 %
Eos: 3 %
Eosinophils Absolute: 0.2 10*3/uL (ref 0.0–0.4)
Ferritin: 10 ng/mL — ABNORMAL LOW (ref 15–150)
Folate: 11.1 ng/mL (ref >3.0)
HCT: 33.8 % — ABNORMAL LOW (ref 34.0–46.6)
Hemoglobin: 10.8 g/dL — ABNORMAL LOW (ref 11.1–15.9)
Immature Grans (Abs): 0 10*3/uL (ref 0.0–0.1)
Immature Granulocytes: 0 %
Iron Saturation: 6 % — CL (ref 15–55)
Iron: 26 ug/dL — ABNORMAL LOW (ref 35–155)
Lymphocytes Absolute: 1.4 10*3/uL (ref 0.7–3.1)
Lymphs: 30 %
MCH: 26.3 pg — ABNORMAL LOW (ref 26.6–33.0)
MCHC: 32 g/dL (ref 31.5–35.7)
MCV: 82 fL (ref 79–97)
Monocytes Absolute: 0.3 10*3/uL (ref 0.1–0.9)
Monocytes: 6 %
Neutrophils Absolute: 2.8 10*3/uL (ref 1.4–7.0)
Neutrophils Relative %: 61 %
Platelets: 263 10*3/uL (ref 150–379)
RBC: 4.11 x10E6/uL (ref 3.77–5.28)
RDW: 15.7 % — ABNORMAL HIGH (ref 12.3–15.4)
Retic Ct Pct: 1.1 % (ref 0.6–2.6)
TIBC: 429 ug/dL (ref 250–450)
UIBC: 403 ug/dL — ABNORMAL HIGH (ref 150–375)
Vitamin B-12: 524 pg/mL (ref 211–946)
WBC: 4.6 10*3/uL (ref 3.4–10.8)

## 2014-11-01 LAB — THYROID PEROXIDASE ANTIBODY: Thyroid Peroxidase Ab: 7 IU/mL (ref 0–34)

## 2014-11-01 LAB — T3, FREE: T3, Free: 3.3 pg/mL (ref 2.0–4.4)

## 2014-11-01 LAB — TESTOSTERONE,FREE AND TOTAL
Testosterone, Free: 2.7 pg/mL (ref 0.0–4.2)
Testosterone: 20 ng/dL (ref 8–48)

## 2014-11-01 LAB — T4, FREE: Free T4: 1.27 ng/dL (ref 0.82–1.77)

## 2014-11-01 LAB — THYROGLOBULIN ANTIBODY: Thyroglobulin Antibody: <1 IU/mL (ref 0.0–0.9)

## 2014-11-01 LAB — TSH: TSH: 0.743 u[IU]/mL (ref 0.450–4.500)

## 2014-11-03 ENCOUNTER — Telehealth: Payer: Self-pay | Admitting: Nurse Practitioner

## 2014-11-03 ENCOUNTER — Telehealth: Payer: Self-pay

## 2014-11-03 DIAGNOSIS — E1165 Type 2 diabetes mellitus with hyperglycemia: Secondary | ICD-10-CM

## 2014-11-03 MED ORDER — METFORMIN HCL 500 MG PO TABS: 500.0000 mg | ORAL_TABLET | Freq: Two times a day (BID) | ORAL | Status: DC

## 2014-11-03 NOTE — Telephone Encounter (Signed)
Aware of lab results and will check with the pharmacy for new script.

## 2014-11-03 NOTE — Telephone Encounter (Signed)
Letter sent with results

## 2014-11-03 NOTE — Telephone Encounter (Signed)
-----   Message from Chevis Pretty, [place] sent at 11/02/2014  4:27 PM EST ----- Urine clear Hgba1c discussed at appointment TSH WNL hgb low- needs to continue OTC iron sutestosterone normalpplements

## 2014-11-03 NOTE — Telephone Encounter (Signed)
Metformin rx corrected

## 2014-11-03 NOTE — Telephone Encounter (Signed)
-----   Message from Chevis Pretty, [place] sent at 11/02/2014  4:27 PM EST ----- Urine clear Hgba1c discussed at appointment TSH WNL hgb low- needs to continue OTC iron sutestosterone normalpplements

## 2014-11-07 ENCOUNTER — Telehealth: Payer: Self-pay | Admitting: Nurse Practitioner

## 2014-11-07 NOTE — Telephone Encounter (Signed)
Please review lab results and patient wants copy sent to [facility] doctor.

## 2014-11-07 NOTE — Telephone Encounter (Signed)
Have already reviewed

## 2014-11-07 NOTE — Telephone Encounter (Signed)
If not done, please send all lab results to Dr. Evie Lacks in [place].

## 2014-11-07 NOTE — Telephone Encounter (Signed)
Lab results sent electronically.

## 2014-11-25 ENCOUNTER — Telehealth: Payer: Self-pay | Admitting: Nurse Practitioner

## 2014-12-06 ENCOUNTER — Other Ambulatory Visit: Payer: Self-pay | Admitting: Nurse Practitioner

## 2014-12-08 ENCOUNTER — Telehealth: Payer: Self-pay | Admitting: Nurse Practitioner

## 2014-12-08 NOTE — Telephone Encounter (Signed)
Stp advised no samples of Bystolic, pt states she received papers in the mail from [place] regarding her referral to [place]. She states she had been speaking with [facility] and she was taking care of this for her but Dr.Wilson's nurse doesn't have there referral yet.

## 2015-01-03 ENCOUNTER — Other Ambulatory Visit: Payer: Self-pay | Admitting: Nurse Practitioner

## 2015-01-04 NOTE — Telephone Encounter (Signed)
Last seen 10/31/14 [name] If approved route to nurse to call into [facility]  [phone]

## 2015-01-05 NOTE — Telephone Encounter (Signed)
Please call in xanax with 1 refills 

## 2015-01-05 NOTE — Telephone Encounter (Signed)
rx called into pharmacy voicemail, pt aware.

## 2015-03-03 ENCOUNTER — Other Ambulatory Visit: Payer: Self-pay | Admitting: *Deleted

## 2015-03-03 ENCOUNTER — Telehealth: Payer: Self-pay | Admitting: Nurse Practitioner

## 2015-03-03 MED ORDER — ALPRAZOLAM 0.5 MG PO TABS: 0.5000 mg | ORAL_TABLET | Freq: Two times a day (BID) | ORAL | Status: DC

## 2015-03-03 NOTE — Telephone Encounter (Signed)
Already done

## 2015-03-03 NOTE — Telephone Encounter (Signed)
Last filled 02/01/15, last seen 10/31/14. Call into [facility] [phone]

## 2015-03-03 NOTE — Telephone Encounter (Signed)
Xanax refill called to pharmacy vm.

## 2015-03-03 NOTE — Telephone Encounter (Signed)
Please call in xanax with 1 refills 

## 2015-04-14 ENCOUNTER — Telehealth: Payer: Self-pay | Admitting: Nurse Practitioner

## 2015-04-14 NOTE — Telephone Encounter (Signed)
Appointment given for 6/23 with Ronnald Collum, [place].

## 2015-04-28 ENCOUNTER — Other Ambulatory Visit: Payer: Self-pay | Admitting: *Deleted

## 2015-04-28 NOTE — Telephone Encounter (Signed)
Last filled 03/30/15, has appt on 05/11/15. Route to pool, nurse call into [facility] [phone]

## 2015-05-01 MED ORDER — ALPRAZOLAM 0.5 MG PO TABS: 0.5000 mg | ORAL_TABLET | Freq: Two times a day (BID) | ORAL | Status: DC

## 2015-05-01 NOTE — Telephone Encounter (Signed)
Refill called to [facility] 

## 2015-05-11 ENCOUNTER — Ambulatory Visit: Payer: 59 | Admitting: Nurse Practitioner

## 2015-05-19 ENCOUNTER — Other Ambulatory Visit: Payer: Self-pay | Admitting: Nurse Practitioner

## 2015-05-20 ENCOUNTER — Other Ambulatory Visit: Payer: Self-pay | Admitting: Nurse Practitioner

## 2015-05-30 ENCOUNTER — Telehealth: Payer: Self-pay | Admitting: Nurse Practitioner

## 2015-05-30 NOTE — Telephone Encounter (Signed)
Next appt 06/06/15, last seen 11/02/15. Last filled 05/01/15. Nurse call in to [facility] [phone]

## 2015-05-31 MED ORDER — ALPRAZOLAM 0.5 MG PO TABS: 0.5000 mg | ORAL_TABLET | Freq: Two times a day (BID) | ORAL | Status: DC

## 2015-05-31 NOTE — Telephone Encounter (Signed)
Refill called to [facility] 

## 2015-05-31 NOTE — Telephone Encounter (Signed)
Please call in xanax 0.5mg 1 po BID #60 with 1 refills 

## 2015-06-05 ENCOUNTER — Telehealth: Payer: Self-pay | Admitting: Nurse Practitioner

## 2015-06-05 NOTE — Telephone Encounter (Signed)
Pt given appt 7/26 at 9:00.

## 2015-06-06 ENCOUNTER — Ambulatory Visit: Payer: 59 | Admitting: Nurse Practitioner

## 2015-06-12 ENCOUNTER — Telehealth: Payer: Self-pay | Admitting: Nurse Practitioner

## 2015-06-12 NOTE — Telephone Encounter (Signed)
Never mind. She rescheduled

## 2015-06-13 ENCOUNTER — Other Ambulatory Visit: Payer: Self-pay | Admitting: Nurse Practitioner

## 2015-06-13 ENCOUNTER — Telehealth: Payer: Self-pay | Admitting: Nurse Practitioner

## 2015-06-13 ENCOUNTER — Ambulatory Visit: Payer: 59 | Admitting: Nurse Practitioner

## 2015-06-14 ENCOUNTER — Telehealth: Payer: Self-pay | Admitting: Nurse Practitioner

## 2015-06-14 ENCOUNTER — Other Ambulatory Visit: Payer: Self-pay | Admitting: *Deleted

## 2015-06-14 MED ORDER — FAMOTIDINE 20 MG PO TABS: 20.0000 mg | ORAL_TABLET | Freq: Every day | ORAL | Status: DC

## 2015-06-14 NOTE — Telephone Encounter (Signed)
appt changed

## 2015-06-15 ENCOUNTER — Telehealth: Payer: Self-pay | Admitting: Nurse Practitioner

## 2015-06-15 MED ORDER — ALBUTEROL SULFATE HFA 108 (90 BASE) MCG/ACT IN AERS: 1.0000 | INHALATION_SPRAY | Freq: Every day | RESPIRATORY_TRACT | Status: DC | PRN

## 2015-06-15 NOTE — Telephone Encounter (Signed)
Patient notified that rx sent to pharmacy 

## 2015-06-15 NOTE — Telephone Encounter (Signed)
Inhaler sent to pharmacy

## 2015-06-17 ENCOUNTER — Other Ambulatory Visit: Payer: Self-pay | Admitting: Nurse Practitioner

## 2015-06-20 ENCOUNTER — Ambulatory Visit (INDEPENDENT_AMBULATORY_CARE_PROVIDER_SITE_OTHER): Payer: 59 | Admitting: Nurse Practitioner

## 2015-06-20 ENCOUNTER — Other Ambulatory Visit: Payer: Self-pay | Admitting: Nurse Practitioner

## 2015-06-20 ENCOUNTER — Encounter: Payer: Self-pay | Admitting: Nurse Practitioner

## 2015-06-20 VITALS — BP 133/80 | HR 88 | Temp 97.4°F | Ht 62.0 in | Wt 140.0 lb

## 2015-06-20 DIAGNOSIS — F411 Generalized anxiety disorder: Secondary | ICD-10-CM

## 2015-06-20 DIAGNOSIS — E119 Type 2 diabetes mellitus without complications: Secondary | ICD-10-CM | POA: Diagnosis not present

## 2015-06-20 DIAGNOSIS — K219 Gastro-esophageal reflux disease without esophagitis: Secondary | ICD-10-CM

## 2015-06-20 DIAGNOSIS — E785 Hyperlipidemia, unspecified: Secondary | ICD-10-CM | POA: Diagnosis not present

## 2015-06-20 DIAGNOSIS — E042 Nontoxic multinodular goiter: Secondary | ICD-10-CM | POA: Diagnosis not present

## 2015-06-20 DIAGNOSIS — E282 Polycystic ovarian syndrome: Secondary | ICD-10-CM | POA: Diagnosis not present

## 2015-06-20 DIAGNOSIS — E8770 Fluid overload, unspecified: Secondary | ICD-10-CM | POA: Diagnosis not present

## 2015-06-20 DIAGNOSIS — J4522 Mild intermittent asthma with status asthmaticus: Secondary | ICD-10-CM

## 2015-06-20 DIAGNOSIS — J329 Chronic sinusitis, unspecified: Secondary | ICD-10-CM | POA: Diagnosis not present

## 2015-06-20 DIAGNOSIS — I10 Essential (primary) hypertension: Secondary | ICD-10-CM | POA: Diagnosis not present

## 2015-06-20 DIAGNOSIS — R609 Edema, unspecified: Secondary | ICD-10-CM

## 2015-06-20 LAB — POCT GLYCOSYLATED HEMOGLOBIN (HGB A1C): Hemoglobin A1C: 5.6

## 2015-06-20 MED ORDER — PRAVASTATIN SODIUM 20 MG PO TABS: 20.0000 mg | ORAL_TABLET | Freq: Every day | ORAL | Status: DC

## 2015-06-20 MED ORDER — FUROSEMIDE 20 MG PO TABS: 20.0000 mg | ORAL_TABLET | Freq: Every day | ORAL | Status: DC

## 2015-06-20 MED ORDER — MONTELUKAST SODIUM 10 MG PO TABS: 10.0000 mg | ORAL_TABLET | Freq: Every day | ORAL | Status: DC

## 2015-06-20 MED ORDER — NEBIVOLOL HCL 5 MG PO TABS: 5.0000 mg | ORAL_TABLET | Freq: Every day | ORAL | Status: DC

## 2015-06-20 MED ORDER — METFORMIN HCL 500 MG PO TABS: 500.0000 mg | ORAL_TABLET | Freq: Two times a day (BID) | ORAL | Status: DC

## 2015-06-20 MED ORDER — BUDESONIDE-FORMOTEROL FUMARATE 160-4.5 MCG/ACT IN AERO: INHALATION_SPRAY | RESPIRATORY_TRACT | Status: DC

## 2015-06-20 MED ORDER — ALPRAZOLAM 0.5 MG PO TABS: 0.5000 mg | ORAL_TABLET | Freq: Two times a day (BID) | ORAL | Status: DC

## 2015-06-20 NOTE — Patient Instructions (Signed)

## 2015-06-20 NOTE — Progress Notes (Signed)
Subjective:    Patient ID: Jennifer Mullins, female    DOB: 1970-11-20, 44 y.o.   MRN: [id number]   Patient here today for follow up of chronic medical problems.   * her only complaint is dizziness- that she thinks is coming from her ears feeling stopped up.  Diabetes She presents for her follow-up diabetic visit. She has type 2 diabetes mellitus. No MedicAlert identification noted. Her disease course has been stable. There are no hypoglycemic associated symptoms. Pertinent negatives for diabetes include no chest pain, no polydipsia, no polyphagia, no polyuria and no weakness. There are no hypoglycemic complications. There are no diabetic complications. Risk factors for coronary artery disease include dyslipidemia and hypertension. Her weight is stable. She is following a diabetic diet. When asked about meal planning, she reported none. She has not had a previous visit with a dietitian. She rarely participates in exercise. Her breakfast blood glucose is taken between 8-9 am. Her breakfast blood glucose range is generally 130-140 mg/dl. Her overall blood glucose range is 130-140 mg/dl. An ACE inhibitor/angiotensin II receptor blocker is being taken. She does not see a podiatrist.Eye exam is not current.  Hypertension This is a chronic problem. The current episode started more than 1 year ago. The problem is controlled. Pertinent negatives include no chest pain. Risk factors for coronary artery disease include diabetes mellitus, dyslipidemia and post-menopausal state. Past treatments include beta blockers. The current treatment provides moderate improvement. Compliance problems include diet and exercise.   Hyperlipidemia This is a chronic problem. The current episode started more than 1 year ago. The problem is controlled. Recent lipid tests were reviewed and are high. Exacerbating diseases include diabetes. She has no history of hypothyroidism or obesity. Pertinent negatives include no chest pain. She is  currently on no antihyperlipidemic treatment. Risk factors for coronary artery disease include diabetes mellitus, hypertension and post-menopausal.  GERD Currently on pepcid and protonix- combination keeps her symptoms under control Asthma/ allergies/Chronic sinusitis Symbicort, flonase and singulair keeps her under control- occasional wheezes but for the most art doing well. Seeing specialist ( asthma and allergist ) every 6 months. She has had surgery on her sinus in the past and still has los of congestion GAD Cannot function without her xanax- takes several times a day Nontoxic goiter Seeing Dr. Lorrene Reid- labs ordered today PCOS On metformin for diabetes which is suppose to help with PCOS- she denies any pelvic cramping.  Review of Systems  Constitutional: Negative.   Cardiovascular: Negative for chest pain.  Endocrine: Negative for polydipsia, polyphagia and polyuria.  Neurological: Negative for weakness.  Psychiatric/Behavioral: Negative.   All other systems reviewed and are negative.      Objective:   Physical Exam  Constitutional: She is oriented to person, place, and time. She appears well-developed and well-nourished.  HENT:  Nose: Nose normal.  Mouth/Throat: Oropharynx is clear and moist.  Eyes: EOM are normal.  Neck: Trachea normal, normal range of motion and full passive range of motion without pain. Neck supple. No JVD present. Carotid bruit is not present. No thyromegaly present.  Cardiovascular: Normal rate, regular rhythm, normal heart sounds and intact distal pulses.  Exam reveals no gallop and no friction rub.   No murmur heard. Pulmonary/Chest: Effort normal and breath sounds normal.  Abdominal: Soft. Bowel sounds are normal. She exhibits no distension and no mass. There is no tenderness.  Musculoskeletal: Normal range of motion.  Lymphadenopathy:    She has no cervical adenopathy.  Neurological: She is  alert and oriented to person, place, and time. She has  normal reflexes.  Skin: Skin is warm and dry.  Psychiatric: She has a normal mood and affect. Her behavior is normal. Judgment and thought content normal.   BP 133/80 mmHg  Pulse 88  Temp(Src) 97.4 F (36.3 C) (Oral)  Ht _0  (1.575 m)  Wt 140 lb (63.504 kg)  BMI 25.60 kg/m2  Results for orders placed or performed in visit on 06/20/15  POCT glycosylated hemoglobin (Hb A1C)  Result Value Ref Range   Hemoglobin A1C 5.6         Assessment & Plan:   1. Type 2 diabetes mellitus without complication Continue carb counting - POCT glycosylated hemoglobin (Hb A1C) - metFORMIN (GLUCOPHAGE) 500 MG tablet; Take 1 tablet (500 mg total) by mouth 2 (two) times daily with a meal.  Dispense: 60 tablet; Refill: 5  2. Hyperlipidemia LDL goal <100 Low fat diet - Lipid panel - pravastatin (PRAVACHOL) 20 MG tablet; Take 1 tablet (20 mg total) by mouth daily.  Dispense: 30 tablet; Refill: 5  3. Essential hypertension Do not add salt to diet - CMP14+EGFR - nebivolol (BYSTOLIC) 5 MG tablet; Take 1 tablet (5 mg total) by mouth daily.  Dispense: 30 tablet; Refill: 5  4. Gastroesophageal reflux disease without esophagitis Avoid spicy foods Do not eat 2 hours prior to bedtime   5. GAD (generalized anxiety disorder) Stress management - ALPRAZolam (XANAX) 0.5 MG tablet; Take 1 tablet (0.5 mg total) by mouth 2 (two) times daily.  Dispense: 60 tablet; Refill: 0  6. PCOS (polycystic ovarian syndrome)  7. Nontoxic multinodular goiter Keep follow up with DR. Nida - Thyroid Panel With TSH  8. Extrinsic asthma, mild intermittent, with status asthmaticus Avoid allergens - montelukast (SINGULAIR) 10 MG tablet; Take 1 tablet (10 mg total) by mouth at bedtime.  Dispense: 30 tablet; Refill: 5 - budesonide-formoterol (SYMBICORT) 160-4.5 MCG/ACT inhaler; INHALE 2 PUFFS BY MOUTH FIRST THING IN THE MORNING AND THEN 2 PUFFS 12 HOURS LATER.  Dispense: 10.2 g; Refill: 5  9. Body fluid retention Elevate legs  when sitting - furosemide (LASIX) 20 MG tablet; Take 1 tablet (20 mg total) by mouth daily.  Dispense: 30 tablet; Refill: 5  10. Chronic sinusitis, unspecified location Will refer back to ENT when needed    Labs pending Health maintenance reviewed Diet and exercise encouraged Continue all meds Follow up  In 3 months   [place], FNP

## 2015-06-21 LAB — CMP14+EGFR
ALT: 23 IU/L (ref 0–32)
AST: 19 IU/L (ref 0–40)
Albumin/Globulin Ratio: 1.8 (ref 1.1–2.5)
Albumin: 4.2 g/dL (ref 3.5–5.5)
Alkaline Phosphatase: 65 IU/L (ref 39–117)
BUN/Creatinine Ratio: 8 — ABNORMAL LOW (ref 9–23)
BUN: 6 mg/dL (ref 6–24)
Bilirubin Total: 0.3 mg/dL (ref 0.0–1.2)
CO2: 24 mmol/L (ref 18–29)
Calcium: 8.5 mg/dL — ABNORMAL LOW (ref 8.7–10.2)
Chloride: 99 mmol/L (ref 97–108)
Creatinine, Ser: 0.75 mg/dL (ref 0.57–1.00)
GFR calc Af Amer: 111 mL/min/{1.73_m2} (ref >59)
GFR calc non Af Amer: 96 mL/min/{1.73_m2} (ref >59)
Globulin, Total: 2.4 g/dL (ref 1.5–4.5)
Glucose: 94 mg/dL (ref 65–99)
Potassium: 4 mmol/L (ref 3.5–5.2)
Sodium: 137 mmol/L (ref 134–144)
Total Protein: 6.6 g/dL (ref 6.0–8.5)

## 2015-06-21 LAB — THYROID PANEL WITH TSH
Free Thyroxine Index: 2.8 (ref 1.2–4.9)
T3 Uptake Ratio: 25 % (ref 24–39)
T4, Total: 11.2 ug/dL (ref 4.5–12.0)
TSH: 1.2 u[IU]/mL (ref 0.450–4.500)

## 2015-06-21 LAB — LIPID PANEL
Chol/HDL Ratio: 4 ratio units (ref 0.0–4.4)
Cholesterol, Total: 235 mg/dL — ABNORMAL HIGH (ref 100–199)
HDL: 59 mg/dL (ref >39)
LDL Calculated: 145 mg/dL — ABNORMAL HIGH (ref 0–99)
Triglycerides: 154 mg/dL — ABNORMAL HIGH (ref 0–149)
VLDL Cholesterol Cal: 31 mg/dL (ref 5–40)

## 2015-06-23 ENCOUNTER — Telehealth: Payer: Self-pay | Admitting: Family Medicine

## 2015-06-23 NOTE — Telephone Encounter (Signed)
Spoke with [facility] office and they will send [place] a release if more information is needed.

## 2015-07-03 ENCOUNTER — Telehealth: Payer: Self-pay | Admitting: Nurse Practitioner

## 2015-07-03 NOTE — Telephone Encounter (Signed)
Return patient's phone call.  Patient states she has a sinus infection and would like an antibiotic called into her pharmacy.  Informed patient that she would need to be seen before and antibiotic would be prescribed.  Patient stated she would call back to make an appointment.

## 2015-07-07 ENCOUNTER — Ambulatory Visit: Payer: 59 | Admitting: Nurse Practitioner

## 2015-07-14 ENCOUNTER — Other Ambulatory Visit: Payer: Self-pay | Admitting: Nurse Practitioner

## 2015-07-19 ENCOUNTER — Other Ambulatory Visit: Payer: Self-pay | Admitting: Family Medicine

## 2015-07-28 DIAGNOSIS — R519 Headache, unspecified: Secondary | ICD-10-CM | POA: Insufficient documentation

## 2015-07-28 DIAGNOSIS — R51 Headache: Secondary | ICD-10-CM

## 2015-07-28 DIAGNOSIS — G8929 Other chronic pain: Secondary | ICD-10-CM | POA: Insufficient documentation

## 2015-07-28 DIAGNOSIS — H1010 Acute atopic conjunctivitis, unspecified eye: Secondary | ICD-10-CM

## 2015-07-28 DIAGNOSIS — J309 Allergic rhinitis, unspecified: Secondary | ICD-10-CM

## 2015-08-01 ENCOUNTER — Encounter: Payer: Self-pay | Admitting: Nurse Practitioner

## 2015-08-09 ENCOUNTER — Other Ambulatory Visit: Payer: Self-pay

## 2015-08-09 MED ORDER — IPRATROPIUM BROMIDE 0.03 % NA SOLN: 2.0000 | Freq: Two times a day (BID) | NASAL | Status: DC

## 2015-08-21 ENCOUNTER — Other Ambulatory Visit: Payer: Self-pay | Admitting: Nurse Practitioner

## 2015-08-21 ENCOUNTER — Telehealth: Payer: Self-pay | Admitting: Nurse Practitioner

## 2015-08-21 DIAGNOSIS — F411 Generalized anxiety disorder: Secondary | ICD-10-CM

## 2015-08-21 MED ORDER — ALPRAZOLAM 0.5 MG PO TABS: 0.5000 mg | ORAL_TABLET | Freq: Two times a day (BID) | ORAL | Status: DC

## 2015-08-21 NOTE — Telephone Encounter (Signed)
Last filled 06/30/15, last seen 06/20/15. Call in

## 2015-08-21 NOTE — Telephone Encounter (Signed)
Refill called to [facility] 

## 2015-08-21 NOTE — Telephone Encounter (Signed)
Please call in xanax with 0 refills 

## 2015-08-29 ENCOUNTER — Ambulatory Visit: Payer: 59 | Admitting: Allergy and Immunology

## 2015-08-30 ENCOUNTER — Other Ambulatory Visit: Payer: Self-pay | Admitting: Allergy and Immunology

## 2015-09-14 ENCOUNTER — Other Ambulatory Visit: Payer: Self-pay | Admitting: Allergy and Immunology

## 2015-09-14 ENCOUNTER — Telehealth: Payer: Self-pay | Admitting: Nurse Practitioner

## 2015-09-15 ENCOUNTER — Other Ambulatory Visit: Payer: Self-pay | Admitting: Neurology

## 2015-09-15 MED ORDER — CYPROHEPTADINE HCL 4 MG PO TABS: 4.0000 mg | ORAL_TABLET | Freq: Every day | ORAL | Status: DC

## 2015-09-21 ENCOUNTER — Telehealth: Payer: Self-pay | Admitting: Nurse Practitioner

## 2015-09-21 ENCOUNTER — Other Ambulatory Visit: Payer: Self-pay | Admitting: Nurse Practitioner

## 2015-09-21 NOTE — Telephone Encounter (Signed)
Last filled 08/22/15, last seen 06/20/15. Call in at [place]

## 2015-09-21 NOTE — Telephone Encounter (Signed)
Last seen 06/20/15 [name] If approved route to nurse to call into [facility]  [phone]

## 2015-09-22 ENCOUNTER — Telehealth: Payer: Self-pay | Admitting: Nurse Practitioner

## 2015-09-22 NOTE — Telephone Encounter (Signed)
Please call in xanax with 0 refills 

## 2015-09-22 NOTE — Telephone Encounter (Signed)
Pt aware of md feedback.

## 2015-09-22 NOTE — Telephone Encounter (Signed)
I cannot order these test without her being seen

## 2015-09-22 NOTE — Telephone Encounter (Signed)
Already addressed

## 2015-09-22 NOTE — Telephone Encounter (Signed)
rx called in to pharmacy, pt is aware.

## 2015-09-25 ENCOUNTER — Telehealth: Payer: Self-pay | Admitting: Nurse Practitioner

## 2015-09-25 NOTE — Telephone Encounter (Signed)
Stp and rescheduled her for 11/17.

## 2015-10-03 ENCOUNTER — Ambulatory Visit: Payer: 59 | Admitting: Nurse Practitioner

## 2015-10-05 ENCOUNTER — Ambulatory Visit: Payer: 59 | Admitting: Nurse Practitioner

## 2015-10-06 ENCOUNTER — Telehealth: Payer: Self-pay | Admitting: Nurse Practitioner

## 2015-10-06 NOTE — Telephone Encounter (Signed)
[name] has the form on her desk.  Ms. Jennifer Mullins wanted to know when it would be ready.  Needs ASAP.

## 2015-10-09 NOTE — Telephone Encounter (Signed)
[name] advised that patient must be seen before she can fill out form.  Patient aware and will keep her appt on the 29th.

## 2015-10-10 NOTE — Telephone Encounter (Signed)
Per [name] pt must be seen, pt aware

## 2015-10-17 ENCOUNTER — Ambulatory Visit: Payer: 59 | Admitting: Nurse Practitioner

## 2015-10-20 ENCOUNTER — Other Ambulatory Visit: Payer: Self-pay | Admitting: *Deleted

## 2015-10-20 NOTE — Telephone Encounter (Signed)
ntbs for xanax refill. Patient has missed several appointments

## 2015-10-20 NOTE — Telephone Encounter (Signed)
lmtcb regarding medication refill. Jennifer Mullins denied refill d/t pt having missed several appts recently. Will NTBS

## 2015-10-20 NOTE — Telephone Encounter (Signed)
Last filled 09/22/15, last seen 06/20/15. Call in at [facility]

## 2015-10-21 ENCOUNTER — Other Ambulatory Visit: Payer: Self-pay | Admitting: Nurse Practitioner

## 2015-10-21 NOTE — Telephone Encounter (Signed)
I tried to contact patient on the number that I wrote down while on the phone with her but it is not the correct number. I also tried to contact the patient on the other numbers in her chart but was unable to reach her.

## 2015-10-21 NOTE — Telephone Encounter (Signed)
#  12 called into and left on [facility]. Patient has an appt on 12/8 and will need to keep that appt for refills.

## 2015-10-26 ENCOUNTER — Encounter: Payer: Self-pay | Admitting: Nurse Practitioner

## 2015-10-26 ENCOUNTER — Ambulatory Visit (INDEPENDENT_AMBULATORY_CARE_PROVIDER_SITE_OTHER): Payer: 59 | Admitting: Nurse Practitioner

## 2015-10-26 VITALS — BP 130/82 | HR 88 | Temp 97.0°F | Ht 62.0 in | Wt 135.0 lb

## 2015-10-26 DIAGNOSIS — E8770 Fluid overload, unspecified: Secondary | ICD-10-CM

## 2015-10-26 DIAGNOSIS — I10 Essential (primary) hypertension: Secondary | ICD-10-CM | POA: Diagnosis not present

## 2015-10-26 DIAGNOSIS — F411 Generalized anxiety disorder: Secondary | ICD-10-CM | POA: Diagnosis not present

## 2015-10-26 DIAGNOSIS — J4522 Mild intermittent asthma with status asthmaticus: Secondary | ICD-10-CM | POA: Diagnosis not present

## 2015-10-26 DIAGNOSIS — E282 Polycystic ovarian syndrome: Secondary | ICD-10-CM | POA: Diagnosis not present

## 2015-10-26 DIAGNOSIS — K219 Gastro-esophageal reflux disease without esophagitis: Secondary | ICD-10-CM | POA: Diagnosis not present

## 2015-10-26 DIAGNOSIS — E785 Hyperlipidemia, unspecified: Secondary | ICD-10-CM

## 2015-10-26 DIAGNOSIS — E042 Nontoxic multinodular goiter: Secondary | ICD-10-CM | POA: Diagnosis not present

## 2015-10-26 DIAGNOSIS — E119 Type 2 diabetes mellitus without complications: Secondary | ICD-10-CM | POA: Diagnosis not present

## 2015-10-26 DIAGNOSIS — R609 Edema, unspecified: Secondary | ICD-10-CM

## 2015-10-26 LAB — POCT GLYCOSYLATED HEMOGLOBIN (HGB A1C): Hemoglobin A1C: 5.6

## 2015-10-26 MED ORDER — PANTOPRAZOLE SODIUM 40 MG PO TBEC: 40.0000 mg | DELAYED_RELEASE_TABLET | Freq: Every day | ORAL | Status: DC

## 2015-10-26 MED ORDER — MONTELUKAST SODIUM 10 MG PO TABS: 10.0000 mg | ORAL_TABLET | Freq: Every day | ORAL | Status: DC

## 2015-10-26 MED ORDER — BUDESONIDE-FORMOTEROL FUMARATE 160-4.5 MCG/ACT IN AERO: INHALATION_SPRAY | RESPIRATORY_TRACT | Status: DC

## 2015-10-26 MED ORDER — METFORMIN HCL 500 MG PO TABS: 500.0000 mg | ORAL_TABLET | Freq: Two times a day (BID) | ORAL | Status: DC

## 2015-10-26 MED ORDER — FUROSEMIDE 20 MG PO TABS: 20.0000 mg | ORAL_TABLET | Freq: Every day | ORAL | Status: DC

## 2015-10-26 MED ORDER — ALPRAZOLAM 0.5 MG PO TABS: 0.5000 mg | ORAL_TABLET | Freq: Two times a day (BID) | ORAL | Status: DC

## 2015-10-26 MED ORDER — NEBIVOLOL HCL 5 MG PO TABS: 5.0000 mg | ORAL_TABLET | Freq: Every day | ORAL | Status: DC

## 2015-10-26 NOTE — Progress Notes (Signed)
Subjective:    Patient ID: Jennifer Mullins, female    DOB: 1971/04/15, 44 y.o.   MRN: [id number]   Patient here today for follow up of chronic medical problems.    Diabetes She presents for her follow-up diabetic visit. She has type 2 diabetes mellitus. No MedicAlert identification noted. Her disease course has been stable. There are no hypoglycemic associated symptoms. Pertinent negatives for diabetes include no chest pain, no polydipsia, no polyphagia, no polyuria and no weakness. There are no hypoglycemic complications. There are no diabetic complications. Risk factors for coronary artery disease include dyslipidemia and hypertension. Her weight is stable. She is following a diabetic diet. When asked about meal planning, she reported none. She has not had a previous visit with a dietitian. She rarely participates in exercise. Her breakfast blood glucose is taken between 8-9 am. Her breakfast blood glucose range is generally 130-140 mg/dl. Her overall blood glucose range is 130-140 mg/dl. An ACE inhibitor/angiotensin II receptor blocker is being taken. She does not see a podiatrist.Eye exam is not current.  Hypertension This is a chronic problem. The current episode started more than 1 year ago. The problem is controlled. Pertinent negatives include no chest pain. Risk factors for coronary artery disease include diabetes mellitus, dyslipidemia and post-menopausal state. Past treatments include beta blockers. The current treatment provides moderate improvement. Compliance problems include diet and exercise.   Hyperlipidemia This is a chronic problem. The current episode started more than 1 year ago. The problem is controlled. Recent lipid tests were reviewed and are high. Exacerbating diseases include diabetes. She has no history of hypothyroidism or obesity. Pertinent negatives include no chest pain. She is currently on no antihyperlipidemic treatment. Risk factors for coronary artery disease include  diabetes mellitus, hypertension and post-menopausal.  GERD Currently on pepcid and protonix- combination keeps her symptoms under control Asthma/ allergies/Chronic sinusitis Symbicort, flonase and singulair keeps her under control- occasional wheezes but for the most art doing well. Seeing specialist ( asthma and allergist ) every 6 months. She has had surgery on her sinus in the past and still has los of congestion GAD Cannot function without her xanax- takes several times a day Nontoxic goiter Seeing Dr. Lorrene Mullins- labs ordered today PCOS On metformin for diabetes which is suppose to help with PCOS- she denies any pelvic cramping.  Review of Systems  Constitutional: Negative.   Cardiovascular: Negative for chest pain.  Endocrine: Negative for polydipsia, polyphagia and polyuria.  Neurological: Negative for weakness.  Psychiatric/Behavioral: Negative.   All other systems reviewed and are negative.      Objective:   Physical Exam  Constitutional: She is oriented to person, place, and time. She appears well-developed and well-nourished.  HENT:  Nose: Nose normal.  Mouth/Throat: Oropharynx is clear and moist.  Eyes: EOM are normal.  Neck: Trachea normal, normal range of motion and full passive range of motion without pain. Neck supple. No JVD present. Carotid bruit is not present. No thyromegaly present.  Cardiovascular: Normal rate, regular rhythm, normal heart sounds and intact distal pulses.  Exam reveals no gallop and no friction rub.   No murmur heard. Pulmonary/Chest: Effort normal and breath sounds normal.  Abdominal: Soft. Bowel sounds are normal. She exhibits no distension and no mass. There is no tenderness.  Musculoskeletal: Normal range of motion.  Lymphadenopathy:    She has no cervical adenopathy.  Neurological: She is alert and oriented to person, place, and time. She has normal reflexes.  Skin: Skin is warm  and dry.  Psychiatric: She has a normal mood and affect. Her  behavior is normal. Judgment and thought content normal.   BP 130/82 mmHg  Pulse 88  Temp(Src) 97 F (36.1 C) (Oral)  Ht '5\' 2"'  (1.575 m)  Wt 135 lb (61.236 kg)  BMI 24.69 kg/m2  Results for orders placed or performed in visit on 10/26/15  POCT glycosylated hemoglobin (Hb A1C)  Result Value Ref Range   Hemoglobin A1C 5.6         Assessment & Plan:   1. Diabetes mellitus without complication ([place]) Watch carbs in diet - POCT glycosylated hemoglobin (Hb A1C)  2. Hyperlipidemia LDL goal <100 Low fat diet - Lipid panel  3. Essential hypertension Do not add salt to diet - CMP14+EGFR - nebivolol (BYSTOLIC) 5 MG tablet; Take 1 tablet (5 mg total) by mouth daily.  Dispense: 30 tablet; Refill: 5  4. Extrinsic asthma, mild intermittent, with status asthmaticus - montelukast (SINGULAIR) 10 MG tablet; Take 1 tablet (10 mg total) by mouth at bedtime.  Dispense: 30 tablet; Refill: 5 - budesonide-formoterol (SYMBICORT) 160-4.5 MCG/ACT inhaler; INHALE 2 PUFFS BY MOUTH FIRST THING IN THE MORNING AND THEN 2 PUFFS 12 HOURS LATER.  Dispense: 10.2 g; Refill: 5  5. Gastroesophageal reflux disease without esophagitis Avoid spicy foods Do not eat 2 hours prior to bedtime  - pantoprazole (PROTONIX) 40 MG tablet; Take 1 tablet (40 mg total) by mouth daily.  Dispense: 30 tablet; Refill: 5  6. Type 2 diabetes mellitus without complication, without long-term current use of insulin (H[facility]) Low carb diet - metFORMIN (GLUCOPHAGE) 500 MG tablet; Take 1 tablet (500 mg total) by mouth 2 (two) times daily with a meal.  Dispense: 60 tablet; Refill: 5  7. PCOS (polycystic ovarian syndrome) Follow up with GYN  8. GAD (generalized anxiety disorder) Stress management - ALPRAZolam (XANAX) 0.5 MG tablet; Take 1 tablet (0.5 mg total) by mouth 2 (two) times daily.  Dispense: 60 tablet; Refill: 1  9. Body fluid retention - furosemide (LASIX) 20 MG tablet; Take 1 tablet (20 mg total) by mouth daily.  Dispense:  30 tablet; Refill: 5  10. Multiple thyroid nodules - US Soft Tissue Head/Neck; Future    Labs pending Health maintenance reviewed Diet and exercise encouraged Continue all meds Follow up  In 3 month [place], FNP

## 2015-10-27 ENCOUNTER — Other Ambulatory Visit: Payer: Self-pay | Admitting: Nurse Practitioner

## 2015-10-27 ENCOUNTER — Telehealth: Payer: Self-pay

## 2015-10-27 ENCOUNTER — Telehealth: Payer: Self-pay | Admitting: Nurse Practitioner

## 2015-10-27 LAB — CMP14+EGFR
ALT: 24 IU/L (ref 0–32)
AST: 24 IU/L (ref 0–40)
Albumin/Globulin Ratio: 1.6 (ref 1.1–2.5)
Albumin: 4.4 g/dL (ref 3.5–5.5)
Alkaline Phosphatase: 66 IU/L (ref 39–117)
BUN/Creatinine Ratio: 13 (ref 9–23)
BUN: 9 mg/dL (ref 6–24)
Bilirubin Total: <0.2 mg/dL (ref 0.0–1.2)
CO2: 27 mmol/L (ref 18–29)
Calcium: 9.6 mg/dL (ref 8.7–10.2)
Chloride: 98 mmol/L (ref 97–106)
Creatinine, Ser: 0.67 mg/dL (ref 0.57–1.00)
GFR calc Af Amer: 122 mL/min/{1.73_m2} (ref >59)
GFR calc non Af Amer: 106 mL/min/{1.73_m2} (ref >59)
Globulin, Total: 2.7 g/dL (ref 1.5–4.5)
Glucose: 74 mg/dL (ref 65–99)
Potassium: 3.7 mmol/L (ref 3.5–5.2)
Sodium: 138 mmol/L (ref 136–144)
Total Protein: 7.1 g/dL (ref 6.0–8.5)

## 2015-10-27 LAB — LIPID PANEL
Chol/HDL Ratio: 3.3 ratio units (ref 0.0–4.4)
Cholesterol, Total: 224 mg/dL — ABNORMAL HIGH (ref 100–199)
HDL: 67 mg/dL (ref >39)
LDL Calculated: 122 mg/dL — ABNORMAL HIGH (ref 0–99)
Triglycerides: 176 mg/dL — ABNORMAL HIGH (ref 0–149)
VLDL Cholesterol Cal: 35 mg/dL (ref 5–40)

## 2015-10-27 MED ORDER — PRAVASTATIN SODIUM 40 MG PO TABS
40.0000 mg | ORAL_TABLET | Freq: Every day | ORAL | Status: DC
Start: 2015-10-27 — End: 2015-10-30

## 2015-10-27 NOTE — Telephone Encounter (Signed)
Pt informed of [name]'s recommendation Verbalizes understanding

## 2015-10-27 NOTE — Telephone Encounter (Signed)
Can you call me in an ABX?   Head all stopped up  [facility]

## 2015-10-27 NOTE — Telephone Encounter (Signed)
Does not need antibiotic- looked good when here just try OTC decongestant

## 2015-10-27 NOTE — Telephone Encounter (Signed)
Patient aware to try otc. Will call back Monday if no better.

## 2015-10-30 ENCOUNTER — Telehealth: Payer: Self-pay | Admitting: Nurse Practitioner

## 2015-10-30 MED ORDER — CYPROHEPTADINE HCL 4 MG PO TABS: 4.0000 mg | ORAL_TABLET | Freq: Every day | ORAL | Status: DC

## 2015-10-30 MED ORDER — HYDROXYZINE HCL 50 MG PO TABS: 50.0000 mg | ORAL_TABLET | Freq: Three times a day (TID) | ORAL | Status: DC | PRN

## 2015-10-30 MED ORDER — PRAVASTATIN SODIUM 40 MG PO TABS: 40.0000 mg | ORAL_TABLET | Freq: Every day | ORAL | Status: DC

## 2015-10-30 MED ORDER — ALBUTEROL SULFATE (2.5 MG/3ML) 0.083% IN NEBU: INHALATION_SOLUTION | RESPIRATORY_TRACT | Status: DC

## 2015-10-30 NOTE — Telephone Encounter (Signed)
Refills sent into wrong pharmacy Resent RXs into [facility] in [place] per pt request Okayed per [name]

## 2015-10-30 NOTE — Telephone Encounter (Signed)
Pt notified rx was sent to [facility] Per pt she will pick up RX at [place]

## 2015-10-30 NOTE — Telephone Encounter (Signed)
Patient called stating that she was not feeling any better and wanted an antibiotic.  Informed patient that she will need to be seen before we can prescribe anything.  Patient verbalized understanding and stating she would call back to make an appointment.

## 2015-10-31 ENCOUNTER — Telehealth: Payer: Self-pay | Admitting: Family

## 2015-10-31 ENCOUNTER — Telehealth: Payer: Self-pay | Admitting: Nurse Practitioner

## 2015-10-31 ENCOUNTER — Ambulatory Visit (INDEPENDENT_AMBULATORY_CARE_PROVIDER_SITE_OTHER): Payer: 59 | Admitting: Family

## 2015-10-31 ENCOUNTER — Ambulatory Visit (INDEPENDENT_AMBULATORY_CARE_PROVIDER_SITE_OTHER): Payer: 59

## 2015-10-31 ENCOUNTER — Encounter: Payer: Self-pay | Admitting: Family

## 2015-10-31 VITALS — BP 146/92 | HR 69 | Temp 98.3°F | Ht 62.0 in | Wt 137.2 lb

## 2015-10-31 DIAGNOSIS — J0100 Acute maxillary sinusitis, unspecified: Secondary | ICD-10-CM | POA: Diagnosis not present

## 2015-10-31 DIAGNOSIS — R05 Cough: Secondary | ICD-10-CM

## 2015-10-31 DIAGNOSIS — R059 Cough, unspecified: Secondary | ICD-10-CM

## 2015-10-31 MED ORDER — HYDROCODONE-HOMATROPINE 5-1.5 MG/5ML PO SYRP: 5.0000 mL | ORAL_SOLUTION | Freq: Three times a day (TID) | ORAL | Status: DC | PRN

## 2015-10-31 MED ORDER — AMOXICILLIN-POT CLAVULANATE 875-125 MG PO TABS: 1.0000 | ORAL_TABLET | Freq: Two times a day (BID) | ORAL | Status: DC

## 2015-10-31 MED ORDER — BENZONATATE 200 MG PO CAPS: 200.0000 mg | ORAL_CAPSULE | Freq: Three times a day (TID) | ORAL | Status: DC | PRN

## 2015-10-31 MED ORDER — FLUCONAZOLE 150 MG PO TABS: 150.0000 mg | ORAL_TABLET | ORAL | Status: DC

## 2015-10-31 NOTE — Patient Instructions (Signed)
Sinusitis, Adult Sinusitis is redness, soreness, and inflammation of the paranasal sinuses. Paranasal sinuses are air pockets within the bones of your face. They are located beneath your eyes, in the middle of your forehead, and above your eyes. In healthy paranasal sinuses, mucus is able to drain out, and air is able to circulate through them by way of your nose. However, when your paranasal sinuses are inflamed, mucus and air can become trapped. This can allow bacteria and other germs to grow and cause infection. Sinusitis can develop quickly and last only a short time (acute) or continue over a long period (chronic). Sinusitis that lasts for more than 12 weeks is considered chronic. CAUSES Causes of sinusitis include:  Allergies.  Structural abnormalities, such as displacement of the cartilage that separates your nostrils (deviated septum), which can decrease the air flow through your nose and sinuses and affect sinus drainage.  Functional abnormalities, such as when the small hairs (cilia) that line your sinuses and help remove mucus do not work properly or are not present. SIGNS AND SYMPTOMS Symptoms of acute and chronic sinusitis are the same. The primary symptoms are pain and pressure around the affected sinuses. Other symptoms include:  Upper toothache.  Earache.  Headache.  Bad breath.  Decreased sense of smell and taste.  A cough, which worsens when you are lying flat.  Fatigue.  Fever.  Thick drainage from your nose, which often is green and may contain pus (purulent).  Swelling and warmth over the affected sinuses. DIAGNOSIS Your health care provider will perform a physical exam. During your exam, your health care provider may perform any of the following to help determine if you have acute sinusitis or chronic sinusitis:  Look in your nose for signs of abnormal growths in your nostrils (nasal polyps).  Tap over the affected sinus to check for signs of  infection.  View the inside of your sinuses using an imaging device that has a light attached (endoscope). If your health care provider suspects that you have chronic sinusitis, one or more of the following tests may be recommended:  Allergy tests.  Nasal culture. A sample of mucus is taken from your nose, sent to a lab, and screened for bacteria.  Nasal cytology. A sample of mucus is taken from your nose and examined by your health care provider to determine if your sinusitis is related to an allergy. TREATMENT Most cases of acute sinusitis are related to a viral infection and will resolve on their own within 10 days. Sometimes, medicines are prescribed to help relieve symptoms of both acute and chronic sinusitis. These may include pain medicines, decongestants, nasal steroid sprays, or saline sprays. However, for sinusitis related to a bacterial infection, your health care provider will prescribe antibiotic medicines. These are medicines that will help kill the bacteria causing the infection. Rarely, sinusitis is caused by a fungal infection. In these cases, your health care provider will prescribe antifungal medicine. For some cases of chronic sinusitis, surgery is needed. Generally, these are cases in which sinusitis recurs more than 3 times per year, despite other treatments. HOME CARE INSTRUCTIONS  Drink plenty of water. Water helps thin the mucus so your sinuses can drain more easily.  Use a humidifier.  Inhale steam 3-4 times a day (for example, sit in the bathroom with the shower running).  Apply a warm, moist washcloth to your face 3-4 times a day, or as directed by your health care provider.  Use saline nasal sprays to help   moisten and clean your sinuses.  Take medicines only as directed by your health care provider.  If you were prescribed either an antibiotic or antifungal medicine, finish it all even if you start to feel better. SEEK IMMEDIATE MEDICAL CARE IF:  You have  increasing pain or severe headaches.  You have nausea, vomiting, or drowsiness.  You have swelling around your face.  You have vision problems.  You have a stiff neck.  You have difficulty breathing.   This information is not intended to replace advice given to you by your health care provider. Make sure you discuss any questions you have with your health care provider.   Document Released: 11/04/2005 Document Revised: 11/25/2014 Document Reviewed: 11/19/2011 Elsevier Interactive Patient Education 2016 [employer]  - Take meds as prescribed - Use a cool mist humidifier  -Use saline nose sprays frequently -Saline irrigations of the nose can be very helpful if done frequently.  * 4X daily for 1 week*  * Use of a nettie pot can be helpful with this. Follow directions with this* -Force fluids -For any cough or congestion  Use plain Mucinex- regular strength or max strength is fine   * Children- consult with Pharmacist for dosing -For fever or aces or pains- take tylenol or ibuprofen appropriate for age and weight.  * for fevers greater than 101 orally you may alternate ibuprofen and tylenol every  3 hours. -Throat lozenges if help   Christy Hawks, FNP   

## 2015-10-31 NOTE — Progress Notes (Signed)
Subjective:    Patient ID: Jennifer Mullins, female    DOB: Nov 11, 1971, 44 y.o.   MRN: [id number]  Sinus Problem This is a new problem. The current episode started in the past 7 days. The problem has been gradually worsening since onset. There has been no fever. Her pain is at a severity of 7/10. The pain is moderate. Associated symptoms include congestion, coughing, ear pain, headaches, a hoarse voice, sinus pressure, sneezing and a sore throat. Pertinent negatives include no shortness of breath or swollen glands. Past treatments include acetaminophen (OTC cough meds). The treatment provided mild relief.      Review of Systems  Constitutional: Negative.   HENT: Positive for congestion, ear pain, hoarse voice, sinus pressure, sneezing and sore throat.   Eyes: Negative.   Respiratory: Positive for cough. Negative for shortness of breath.   Cardiovascular: Negative.  Negative for palpitations.  Gastrointestinal: Negative.   Endocrine: Negative.   Genitourinary: Negative.   Musculoskeletal: Negative.   Neurological: Positive for headaches.  Hematological: Negative.   Psychiatric/Behavioral: Negative.   All other systems reviewed and are negative.      Objective:   Physical Exam  Constitutional: She is oriented to person, place, and time. She appears well-developed and well-nourished. No distress.  HENT:  Head: Normocephalic and atraumatic.  Right Ear: External ear normal.  Left Ear: External ear normal.  Nose: Right sinus exhibits maxillary sinus tenderness. Left sinus exhibits maxillary sinus tenderness.  Nasal passage erythemas with mild swelling  Oropharynx erythemas   Eyes: Pupils are equal, round, and reactive to light.  Neck: Normal range of motion. Neck supple. No thyromegaly present.  Cardiovascular: Normal rate, regular rhythm, normal heart sounds and intact distal pulses.   No murmur heard. Pulmonary/Chest: Effort normal and breath sounds normal. No respiratory  distress. She has no wheezes.  Abdominal: Soft. Bowel sounds are normal. She exhibits no distension. There is no tenderness.  Musculoskeletal: Normal range of motion. She exhibits no edema or tenderness.  Neurological: She is alert and oriented to person, place, and time. She has normal reflexes. No cranial nerve deficit.  Skin: Skin is warm and dry.  Psychiatric: She has a normal mood and affect. Her behavior is normal. Judgment and thought content normal.  Vitals reviewed.   Chest X-ray- WNL Preliminary reading by Evelina Dun, FNP [facility]   BP 146/92 mmHg  Pulse 69  Temp(Src) 98.3 F (36.8 C) (Oral)  Ht 5\' 2"  (1.575 m)  Wt 137 lb 3.2 oz (62.234 kg)  BMI 25.09 kg/m2     Assessment & Plan:  1. Acute maxillary sinusitis, recurrence not specified -- Take meds as prescribed - Use a cool mist humidifier  -Use saline nose sprays frequently -Saline irrigations of the nose can be very helpful if done frequently.  * 4X daily for 1 week*  * Use of a nettie pot can be helpful with this. Follow directions with this* -Force fluids -For any cough or congestion  Use plain Mucinex- regular strength or max strength is fine   * Children- consult with Pharmacist for dosing -For fever or aces or pains- take tylenol or ibuprofen appropriate for age and weight.  * for fevers greater than 101 orally you may alternate ibuprofen and tylenol every  3 hours. -Throat lozenges if helps - amoxicillin-clavulanate (AUGMENTIN) 875-125 MG tablet; Take 1 tablet by mouth 2 (two) times daily.  Dispense: 14 tablet; Refill: 0  2. Cough - DG Chest 2 View; Future - HYDROcodone-homatropine (HYCODAN)  5-1.5 MG/5ML syrup; Take 5 mLs by mouth every 8 (eight) hours as needed for cough.  Dispense: 120 mL; Refill: 0 - benzonatate (TESSALON) 200 MG capsule; Take 1 capsule (200 mg total) by mouth 3 (three) times daily as needed.  Dispense: 30 capsule; Refill: [place], FNP

## 2015-10-31 NOTE — Addendum Note (Signed)
Addended by: Evelina Dun A on: 10/31/2015 02:48 PM   Modules accepted: Orders

## 2015-11-01 NOTE — Telephone Encounter (Signed)
Patient returned call yesterday and was seen yesterday per patients request

## 2015-11-03 ENCOUNTER — Ambulatory Visit (HOSPITAL_COMMUNITY)
Admission: RE | Admit: 2015-11-03 | Discharge: 2015-11-03 | Disposition: A | Discharge status: Home / Self Care | Payer: 59 | Source: Ambulatory Visit | Attending: Nurse Practitioner | Admitting: Nurse Practitioner

## 2015-11-03 DIAGNOSIS — E042 Nontoxic multinodular goiter: Secondary | ICD-10-CM | POA: Insufficient documentation

## 2015-11-10 ENCOUNTER — Telehealth: Payer: Self-pay | Admitting: Nurse Practitioner

## 2015-11-10 NOTE — Telephone Encounter (Signed)
Patient aware.

## 2015-11-10 NOTE — Telephone Encounter (Signed)
YOU DO NOT NEED TO HAVE AN [place] EVERY YEAR TO CHECK FIBROIDS. We already had this discussion.

## 2015-11-10 NOTE — Telephone Encounter (Signed)
Patient aware of results and wants to know about getting a pelvic ultra sound to check on her fibroids

## 2015-12-20 ENCOUNTER — Telehealth: Payer: Self-pay | Admitting: Nurse Practitioner

## 2015-12-21 ENCOUNTER — Other Ambulatory Visit: Payer: Self-pay

## 2015-12-21 DIAGNOSIS — F411 Generalized anxiety disorder: Secondary | ICD-10-CM

## 2015-12-21 NOTE — Telephone Encounter (Signed)
Last seen 10/31/15  Jennifer Mullins  If approved route to nurse to call in [facility]   [phone]

## 2015-12-22 ENCOUNTER — Other Ambulatory Visit: Payer: Self-pay | Admitting: Nurse Practitioner

## 2015-12-22 MED ORDER — ALPRAZOLAM 0.5 MG PO TABS: 0.5000 mg | ORAL_TABLET | Freq: Two times a day (BID) | ORAL | Status: DC

## 2015-12-22 NOTE — Telephone Encounter (Signed)
RX for Xanax called into [facility] per [name]

## 2015-12-22 NOTE — Telephone Encounter (Signed)
Please call in xanax with 1 refills 

## 2015-12-22 NOTE — Telephone Encounter (Signed)
done

## 2015-12-25 NOTE — Telephone Encounter (Signed)
Last seen 10/31/15  Jennifer Mullins  If approved route to nurse to call into [facility]    [phone]

## 2015-12-25 NOTE — Telephone Encounter (Signed)
It looks like it was sent in pharmacy on 12/22/15 from [facility]

## 2015-12-26 ENCOUNTER — Telehealth: Payer: Self-pay

## 2015-12-26 ENCOUNTER — Telehealth: Payer: Self-pay | Admitting: Nurse Practitioner

## 2015-12-26 ENCOUNTER — Encounter: Payer: Self-pay | Admitting: Family

## 2015-12-26 ENCOUNTER — Ambulatory Visit (INDEPENDENT_AMBULATORY_CARE_PROVIDER_SITE_OTHER): Payer: BLUE CROSS/BLUE SHIELD | Admitting: Family

## 2015-12-26 VITALS — BP 127/79 | HR 97 | Temp 98.6°F | Ht 62.0 in | Wt 134.6 lb

## 2015-12-26 DIAGNOSIS — R059 Cough, unspecified: Secondary | ICD-10-CM

## 2015-12-26 DIAGNOSIS — R6889 Other general symptoms and signs: Secondary | ICD-10-CM

## 2015-12-26 DIAGNOSIS — R05 Cough: Secondary | ICD-10-CM | POA: Diagnosis not present

## 2015-12-26 DIAGNOSIS — J069 Acute upper respiratory infection, unspecified: Secondary | ICD-10-CM

## 2015-12-26 LAB — POCT INFLUENZA A/B
Influenza A, POC: NEGATIVE
Influenza B, POC: NEGATIVE

## 2015-12-26 MED ORDER — METOPROLOL SUCCINATE ER 50 MG PO TB24: 50.0000 mg | ORAL_TABLET | Freq: Every day | ORAL | Status: DC

## 2015-12-26 MED ORDER — HYDROCODONE-HOMATROPINE 5-1.5 MG/5ML PO SYRP: 5.0000 mL | ORAL_SOLUTION | Freq: Three times a day (TID) | ORAL | Status: DC | PRN

## 2015-12-26 MED ORDER — FLUTICASONE PROPIONATE 50 MCG/ACT NA SUSP: NASAL | Status: DC

## 2015-12-26 MED ORDER — BENZONATATE 200 MG PO CAPS: 200.0000 mg | ORAL_CAPSULE | Freq: Three times a day (TID) | ORAL | Status: DC | PRN

## 2015-12-26 NOTE — Telephone Encounter (Signed)
Had to change bystolic to metoprolol because insurance would not cover bystolic anymore

## 2015-12-26 NOTE — Telephone Encounter (Signed)
I have only seen pt once for an acute visit

## 2015-12-26 NOTE — Patient Instructions (Signed)
Upper Respiratory Infection, Adult Most upper respiratory infections (URIs) are a viral infection of the air passages leading to the lungs. A URI affects the nose, throat, and upper air passages. The most common type of URI is nasopharyngitis and is typically referred to as "the common cold." URIs run their course and usually go away on their own. Most of the time, a URI does not require medical attention, but sometimes a bacterial infection in the upper airways can follow a viral infection. This is called a secondary infection. Sinus and middle ear infections are common types of secondary upper respiratory infections. Bacterial pneumonia can also complicate a URI. A URI can worsen asthma and chronic obstructive pulmonary disease (COPD). Sometimes, these complications can require emergency medical care and may be life threatening.  CAUSES Almost all URIs are caused by viruses. A virus is a type of germ and can spread from one person to another.  RISKS FACTORS You may be at risk for a URI if:   You smoke.   You have chronic heart or lung disease.  You have a weakened defense (immune) system.   You are very young or very old.   You have nasal allergies or asthma.  You work in crowded or poorly ventilated areas.  You work in health care facilities or schools. SIGNS AND SYMPTOMS  Symptoms typically develop 2-3 days after you come in contact with a cold virus. Most viral URIs last 7-10 days. However, viral URIs from the influenza virus (flu virus) can last 14-18 days and are typically more severe. Symptoms may include:   Runny or stuffy (congested) nose.   Sneezing.   Cough.   Sore throat.   Headache.   Fatigue.   Fever.   Loss of appetite.   Pain in your forehead, behind your eyes, and over your cheekbones (sinus pain).  Muscle aches.  DIAGNOSIS  Your health care provider may diagnose a URI by:  Physical exam.  Tests to check that your symptoms are not due to  another condition such as:  Strep throat.  Sinusitis.  Pneumonia.  Asthma. TREATMENT  A URI goes away on its own with time. It cannot be cured with medicines, but medicines may be prescribed or recommended to relieve symptoms. Medicines may help:  Reduce your fever.  Reduce your cough.  Relieve nasal congestion. HOME CARE INSTRUCTIONS   Take medicines only as directed by your health care provider.   Gargle warm saltwater or take cough drops to comfort your throat as directed by your health care provider.  Use a warm mist humidifier or inhale steam from a shower to increase air moisture. This may make it easier to breathe.  Drink enough fluid to keep your urine clear or pale yellow.   Eat soups and other clear broths and maintain good nutrition.   Rest as needed.   Return to work when your temperature has returned to normal or as your health care provider advises. You may need to stay home longer to avoid infecting others. You can also use a face mask and careful hand washing to prevent spread of the virus.  Increase the usage of your inhaler if you have asthma.   Do not use any tobacco products, including cigarettes, chewing tobacco, or electronic cigarettes. If you need help quitting, ask your health care provider. PREVENTION  The best way to protect yourself from getting a cold is to practice good hygiene.   Avoid oral or hand contact with people with cold   symptoms.   Wash your hands often if contact occurs.  There is no clear evidence that vitamin C, vitamin E, echinacea, or exercise reduces the chance of developing a cold. However, it is always recommended to get plenty of rest, exercise, and practice good nutrition.  SEEK MEDICAL CARE IF:   You are getting worse rather than better.   Your symptoms are not controlled by medicine.   You have chills.  You have worsening shortness of breath.  You have brown or red mucus.  You have yellow or brown nasal  discharge.  You have pain in your face, especially when you bend forward.  You have a fever.  You have swollen neck glands.  You have pain while swallowing.  You have white areas in the back of your throat. SEEK IMMEDIATE MEDICAL CARE IF:   You have severe or persistent:  Headache.  Ear pain.  Sinus pain.  Chest pain.  You have chronic lung disease and any of the following:  Wheezing.  Prolonged cough.  Coughing up blood.  A change in your usual mucus.  You have a stiff neck.  You have changes in your:  Vision.  Hearing.  Thinking.  Mood. MAKE SURE YOU:   Understand these instructions.  Will watch your condition.  Will get help right away if you are not doing well or get worse.   This information is not intended to replace advice given to you by your health care provider. Make sure you discuss any questions you have with your health care provider.   Document Released: 04/30/2001 Document Revised: 03/21/2015 Document Reviewed: 02/09/2014 Elsevier Interactive Patient Education 2016 Elsevier Inc.  

## 2015-12-26 NOTE — Telephone Encounter (Signed)
Insurance denied prior authorization for Bystolic   Must try Atenolol, Carvedilol, Metoprolol

## 2015-12-26 NOTE — Progress Notes (Signed)
Subjective:    Patient ID: Jennifer Mullins, female    DOB: 09-Mar-1971, 45 y.o.   MRN: [id number]  Cough This is a new problem. The current episode started in the past 7 days. The problem has been gradually worsening. The problem occurs every few minutes. The cough is productive of purulent sputum. Associated symptoms include chills, ear congestion, headaches, myalgias, nasal congestion, postnasal drip, rhinorrhea, a sore throat and wheezing. Pertinent negatives include no ear pain, fever or shortness of breath. The symptoms are aggravated by lying down. She has tried OTC cough suppressant and rest for the symptoms. The treatment provided mild relief. Her past medical history is significant for asthma and COPD.  Headache  Associated symptoms include coughing, rhinorrhea and a sore throat. Pertinent negatives include no ear pain or fever.      Review of Systems  Constitutional: Positive for chills. Negative for fever.  HENT: Positive for postnasal drip, rhinorrhea and sore throat. Negative for ear pain.   Eyes: Negative.   Respiratory: Positive for cough and wheezing. Negative for shortness of breath.   Cardiovascular: Negative.  Negative for palpitations.  Gastrointestinal: Negative.   Endocrine: Negative.   Genitourinary: Negative.   Musculoskeletal: Positive for myalgias.  Neurological: Positive for headaches.  Hematological: Negative.   Psychiatric/Behavioral: Negative.   All other systems reviewed and are negative.      Objective:   Physical Exam  Constitutional: She is oriented to person, place, and time. She appears well-developed and well-nourished. No distress.  HENT:  Head: Normocephalic and atraumatic.  Right Ear: External ear normal.  Left Ear: External ear normal.  Nose: Right sinus exhibits maxillary sinus tenderness. Right sinus exhibits no frontal sinus tenderness. Left sinus exhibits maxillary sinus tenderness. Left sinus exhibits no frontal sinus tenderness.    Nasal passage erythemas with mild swelling  Oropharynx erythemas   Eyes: Pupils are equal, round, and reactive to light.  Neck: Normal range of motion. Neck supple. No thyromegaly present.  Cardiovascular: Normal rate, regular rhythm, normal heart sounds and intact distal pulses.   No murmur heard. Pulmonary/Chest: Effort normal and breath sounds normal. No respiratory distress. She has no wheezes.  Abdominal: Soft. Bowel sounds are normal. She exhibits no distension. There is no tenderness.  Musculoskeletal: Normal range of motion. She exhibits no edema or tenderness.  Neurological: She is alert and oriented to person, place, and time. She has normal reflexes. No cranial nerve deficit.  Skin: Skin is warm and dry.  Psychiatric: She has a normal mood and affect. Her behavior is normal. Judgment and thought content normal.  Vitals reviewed.   Results for orders placed or performed in visit on 12/26/15  POCT Influenza A/B  Result Value Ref Range   Influenza A, POC Negative Negative   Influenza B, POC Negative Negative     BP 127/79 mmHg  Pulse 97  Temp(Src) 98.6 F (37 C) (Oral)  Ht 5\' 2"  (1.575 m)  Wt 134 lb 9.6 oz (61.054 kg)  BMI 24.61 kg/m2     Assessment & Plan:  1. Flu-like symptoms - POCT Influenza A/B  2. Cough - benzonatate (TESSALON) 200 MG capsule; Take 1 capsule (200 mg total) by mouth 3 (three) times daily as needed.  Dispense: 30 capsule; Refill: 1 - HYDROcodone-homatropine (HYCODAN) 5-1.5 MG/5ML syrup; Take 5 mLs by mouth every 8 (eight) hours as needed for cough.  Dispense: 120 mL; Refill: 0  3. Acute upper respiratory infection -- Take meds as prescribed - Use a  cool mist humidifier  -Use saline nose sprays frequently -Saline irrigations of the nose can be very helpful if done frequently.  * 4X daily for 1 week*  * Use of a nettie pot can be helpful with this. Follow directions with this* -Force fluids -For any cough or congestion  Use plain  Mucinex- regular strength or max strength is fine   * Children- consult with Pharmacist for dosing -For fever or aces or pains- take tylenol or ibuprofen appropriate for age and weight.  * for fevers greater than 101 orally you may alternate ibuprofen and tylenol every  3 hours. -Throat lozenges if helps - benzonatate (TESSALON) 200 MG capsule; Take 1 capsule (200 mg total) by mouth 3 (three) times daily as needed.  Dispense: 30 capsule; Refill: 1 - HYDROcodone-homatropine (HYCODAN) 5-1.5 MG/5ML syrup; Take 5 mLs by mouth every 8 (eight) hours as needed for cough.  Dispense: 120 mL; Refill: 0 - fluticasone (FLONASE) 50 MCG/ACT nasal spray; USE TWO SPRAY(S) IN EACH NOSTRIL ONCE DAILY  Dispense: 16 g; Refill: [place], FNP

## 2015-12-27 ENCOUNTER — Telehealth: Payer: Self-pay | Admitting: Nurse Practitioner

## 2015-12-27 NOTE — Telephone Encounter (Signed)
Please keep trying conservative measures, Pricilla Handler will be back tomorrow and if symptoms persist he can ask her tomorrow and if you develop any high fevers and let us know but keep trying to use the nasal stuff to help clear it out.

## 2015-12-27 NOTE — Telephone Encounter (Signed)
Advised patient of dr dettinger's advice. She will call back christy if needed

## 2015-12-28 ENCOUNTER — Other Ambulatory Visit: Payer: Self-pay | Admitting: Family

## 2015-12-28 DIAGNOSIS — J0100 Acute maxillary sinusitis, unspecified: Secondary | ICD-10-CM

## 2015-12-28 MED ORDER — AMOXICILLIN-POT CLAVULANATE 875-125 MG PO TABS: 1.0000 | ORAL_TABLET | Freq: Two times a day (BID) | ORAL | Status: DC

## 2015-12-28 NOTE — Telephone Encounter (Signed)
Antibiotic Prescription sent to pharmacy   

## 2015-12-28 NOTE — Telephone Encounter (Signed)
Detailed message left for patient that antibiotic sent to pharmacy.

## 2016-01-11 ENCOUNTER — Ambulatory Visit: Payer: BLUE CROSS/BLUE SHIELD | Admitting: Family Medicine

## 2016-01-11 ENCOUNTER — Encounter: Payer: Self-pay | Admitting: Family

## 2016-01-11 ENCOUNTER — Ambulatory Visit (INDEPENDENT_AMBULATORY_CARE_PROVIDER_SITE_OTHER): Payer: BLUE CROSS/BLUE SHIELD

## 2016-01-11 ENCOUNTER — Ambulatory Visit (INDEPENDENT_AMBULATORY_CARE_PROVIDER_SITE_OTHER): Payer: BLUE CROSS/BLUE SHIELD | Admitting: Family

## 2016-01-11 VITALS — BP 135/81 | HR 78 | Temp 98.1°F | Ht 62.0 in | Wt 136.0 lb

## 2016-01-11 DIAGNOSIS — S6991XA Unspecified injury of right wrist, hand and finger(s), initial encounter: Secondary | ICD-10-CM | POA: Diagnosis not present

## 2016-01-11 DIAGNOSIS — S60011A Contusion of right thumb without damage to nail, initial encounter: Secondary | ICD-10-CM

## 2016-01-11 DIAGNOSIS — S60211A Contusion of right wrist, initial encounter: Secondary | ICD-10-CM | POA: Diagnosis not present

## 2016-01-11 DIAGNOSIS — B373 Candidiasis of vulva and vagina: Secondary | ICD-10-CM | POA: Diagnosis not present

## 2016-01-11 DIAGNOSIS — B3731 Acute candidiasis of vulva and vagina: Secondary | ICD-10-CM

## 2016-01-11 MED ORDER — HYDROCODONE-ACETAMINOPHEN 10-325 MG PO TABS: 1.0000 | ORAL_TABLET | Freq: Three times a day (TID) | ORAL | Status: DC | PRN

## 2016-01-11 MED ORDER — FLUCONAZOLE 150 MG PO TABS: 150.0000 mg | ORAL_TABLET | ORAL | Status: DC
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
## 2016-01-11 NOTE — Patient Instructions (Signed)
Hand Contusion  A hand contusion is a deep bruise on your hand area. Contusions are the result of an injury that caused bleeding under the skin. The contusion may turn blue, purple, or yellow. Minor injuries will give you a painless contusion, but more severe contusions may stay painful and swollen for a few weeks.  CAUSES   A contusion is usually caused by a blow, trauma, or direct force to an area of the body.  SYMPTOMS    Swelling and redness of the injured area.   Discoloration of the injured area.   Tenderness and soreness of the injured area.   Pain.  DIAGNOSIS   The diagnosis can be made by taking a history and performing a physical exam. An X-ray, CT scan, or MRI may be needed to determine if there were any associated injuries, such as broken bones (fractures).  TREATMENT   Often, the best treatment for a hand contusion is resting, elevating, icing, and applying cold compresses to the injured area. Over-the-counter medicines may also be recommended for pain control.  HOME CARE INSTRUCTIONS    Put ice on the injured area.    Put ice in a plastic bag.    Place a towel between your skin and the bag.    Leave the ice on for 15-20 minutes, 03-04 times a day.   Only take over-the-counter or prescription medicines as directed by your caregiver. Your caregiver may recommend avoiding anti-inflammatory medicines (aspirin, ibuprofen, and naproxen) for 48 hours because these medicines may increase bruising.   If told, use an elastic wrap as directed. This can help reduce swelling. You may remove the wrap for sleeping, showering, and bathing. If your fingers become numb, cold, or blue, take the wrap off and reapply it more loosely.   Elevate your hand with pillows to reduce swelling.   Avoid overusing your hand if it is painful.  SEEK IMMEDIATE MEDICAL CARE IF:    You have increased redness, swelling, or pain in your hand.   Your swelling or pain is not relieved with medicines.   You have loss of feeling in  your hand or are unable to move your fingers.   Your hand turns cold or blue.   You have pain when you move your fingers.   Your hand becomes warm to the touch.   Your contusion does not improve in 2 days.  MAKE SURE YOU:    Understand these instructions.   Will watch your condition.   Will get help right away if you are not doing well or get worse.     This information is not intended to replace advice given to you by your health care provider. Make sure you discuss any questions you have with your health care provider.     Document Released: 04/26/2002 Document Revised: 07/29/2012 Document Reviewed: 04/27/2012  Elsevier Interactive Patient Education 2016 Elsevier Inc.

## 2016-02-13 ENCOUNTER — Other Ambulatory Visit: Payer: Self-pay | Admitting: Nurse Practitioner

## 2016-02-13 NOTE — Telephone Encounter (Signed)
Last seen 01/11/16 Jennifer Mullins  [name] PCP   If approved route to nurse to call into [facility]  [phone]

## 2016-02-13 NOTE — Telephone Encounter (Signed)
Please call in xanax with 1 refills 

## 2016-03-04 ENCOUNTER — Telehealth: Payer: Self-pay | Admitting: Nurse Practitioner

## 2016-03-21 ENCOUNTER — Telehealth: Payer: Self-pay

## 2016-03-21 NOTE — Telephone Encounter (Signed)
Jennifer Mullins needs you to call her [phone]

## 2016-03-21 NOTE — Telephone Encounter (Signed)
Bystolic prior authorized by insurance - approved

## 2016-03-21 NOTE — Telephone Encounter (Signed)
What does patient want?

## 2016-03-21 NOTE — Telephone Encounter (Signed)
Please call me

## 2016-03-25 ENCOUNTER — Telehealth: Payer: Self-pay | Admitting: Nurse Practitioner

## 2016-03-25 NOTE — Telephone Encounter (Signed)
Bystolic 5mg  placed at front desk

## 2016-03-27 ENCOUNTER — Telehealth: Payer: Self-pay | Admitting: Family

## 2016-03-27 NOTE — Telephone Encounter (Signed)
Appt given for tomorrow with Alyse Low per patients request

## 2016-03-28 ENCOUNTER — Ambulatory Visit (INDEPENDENT_AMBULATORY_CARE_PROVIDER_SITE_OTHER): Payer: BLUE CROSS/BLUE SHIELD | Admitting: Family

## 2016-03-28 ENCOUNTER — Encounter: Payer: Self-pay | Admitting: Family

## 2016-03-28 VITALS — BP 118/82 | HR 86 | Temp 98.6°F | Ht 62.0 in | Wt 136.2 lb

## 2016-03-28 DIAGNOSIS — J069 Acute upper respiratory infection, unspecified: Secondary | ICD-10-CM

## 2016-03-28 DIAGNOSIS — R059 Cough, unspecified: Secondary | ICD-10-CM

## 2016-03-28 DIAGNOSIS — R05 Cough: Secondary | ICD-10-CM

## 2016-03-28 DIAGNOSIS — J4522 Mild intermittent asthma with status asthmaticus: Secondary | ICD-10-CM | POA: Diagnosis not present

## 2016-03-28 MED ORDER — FLUCONAZOLE 150 MG PO TABS: 150.0000 mg | ORAL_TABLET | Freq: Once | ORAL | Status: DC

## 2016-03-28 MED ORDER — FLUTICASONE FUROATE-VILANTEROL 200-25 MCG/INH IN AEPB: 1.0000 | INHALATION_SPRAY | Freq: Every day | RESPIRATORY_TRACT | Status: DC

## 2016-03-28 MED ORDER — AZITHROMYCIN 250 MG PO TABS: ORAL_TABLET | ORAL | Status: DC

## 2016-03-28 MED ORDER — HYDROCODONE-HOMATROPINE 5-1.5 MG/5ML PO SYRP: 5.0000 mL | ORAL_SOLUTION | Freq: Three times a day (TID) | ORAL | Status: DC | PRN

## 2016-03-28 MED ORDER — BENZONATATE 200 MG PO CAPS: 200.0000 mg | ORAL_CAPSULE | Freq: Three times a day (TID) | ORAL | Status: DC | PRN

## 2016-03-28 MED ORDER — METHYLPREDNISOLONE ACETATE 80 MG/ML IJ SUSP
80.0000 mg | Freq: Once | INTRAMUSCULAR | Status: AC
Start: 2016-03-28 — End: 2016-03-28
  Administered 2016-03-28: 80 mg via INTRAMUSCULAR

## 2016-03-28 NOTE — Patient Instructions (Signed)

## 2016-03-28 NOTE — Progress Notes (Signed)
Subjective:    Patient ID: Jennifer Mullins, female    DOB: July 17, 1971, 45 y.o.   MRN: [id number]  Cough This is a new problem. The current episode started in the past 7 days. The problem has been gradually worsening. The problem occurs every few minutes. The cough is productive of sputum. Associated symptoms include chills, ear congestion, headaches, nasal congestion, postnasal drip, rhinorrhea, shortness of breath and wheezing. Pertinent negatives include no ear pain, fever, myalgias or sore throat. The symptoms are aggravated by lying down. She has tried rest, prescription cough suppressant, leukotriene antagonists and ipratropium inhaler for the symptoms. The treatment provided mild relief. Her past medical history is significant for asthma.      Review of Systems  Constitutional: Positive for chills. Negative for fever.  HENT: Positive for postnasal drip and rhinorrhea. Negative for ear pain and sore throat.   Respiratory: Positive for cough, shortness of breath and wheezing.   Musculoskeletal: Negative for myalgias.  Neurological: Positive for headaches.  All other systems reviewed and are negative.      Objective:   Physical Exam  Constitutional: She is oriented to person, place, and time. She appears well-developed and well-nourished. No distress.  HENT:  Head: Normocephalic and atraumatic.  Right Ear: Tympanic membrane is bulging.  Left Ear: Tympanic membrane is bulging.  Mouth/Throat: Oropharynx is clear and moist.  Nasal passage erythemas with mild swelling    Eyes: Pupils are equal, round, and reactive to light.  Neck: Normal range of motion. Neck supple. No thyromegaly present.  Cardiovascular: Normal rate, regular rhythm, normal heart sounds and intact distal pulses.   No murmur heard. Pulmonary/Chest: Effort normal. No respiratory distress. She has wheezes.  Abdominal: Soft. Bowel sounds are normal. She exhibits no distension. There is no tenderness.    Musculoskeletal: Normal range of motion. She exhibits no edema or tenderness.  Neurological: She is alert and oriented to person, place, and time. She has normal reflexes. No cranial nerve deficit.  Skin: Skin is warm and dry.  Psychiatric: She has a normal mood and affect. Her behavior is normal. Judgment and thought content normal.  Vitals reviewed.     BP 118/82 mmHg  Pulse 86  Temp(Src) 98.6 F (37 C) (Oral)  Ht 5\' 2"  (1.575 m)  Wt 136 lb 3.2 oz (61.78 kg)  BMI 24.91 kg/m2  SpO2 100%     Assessment & Plan:  1. Cough - HYDROcodone-homatropine (HYCODAN) 5-1.5 MG/5ML syrup; Take 5 mLs by mouth every 8 (eight) hours as needed for cough.  Dispense: 120 mL; Refill: 0 - benzonatate (TESSALON) 200 MG capsule; Take 1 capsule (200 mg total) by mouth 3 (three) times daily as needed.  Dispense: 30 capsule; Refill: 1  2. Acute upper respiratory infection -- Take meds as prescribed - Use a cool mist humidifier  -Use saline nose sprays frequently -Saline irrigations of the nose can be very helpful if done frequently.  * 4X daily for 1 week*  * Use of a nettie pot can be helpful with this. Follow directions with this* -Force fluids -For any cough or congestion  Use plain Mucinex- regular strength or max strength is fine   * Children- consult with Pharmacist for dosing -For fever or aces or pains- take tylenol or ibuprofen appropriate for age and weight.  * for fevers greater than 101 orally you may alternate ibuprofen and tylenol every  3 hours. -Throat lozenges if help - HYDROcodone-homatropine (HYCODAN) 5-1.5 MG/5ML syrup; Take 5 mLs by  mouth every 8 (eight) hours as needed for cough.  Dispense: 120 mL; Refill: 0 - methylPREDNISolone acetate (DEPO-MEDROL) injection 80 mg; Inject 1 mL (80 mg total) into the muscle once. - azithromycin (ZITHROMAX Z-PAK) 250 MG tablet; As directed  Dispense: 1 each; Refill: 0 - benzonatate (TESSALON) 200 MG capsule; Take 1 capsule (200 mg total) by  mouth 3 (three) times daily as needed.  Dispense: 30 capsule; Refill: 1  3. Extrinsic asthma, mild intermittent, with status asthmaticus -Symbicort changed to [facility] today for better compliance related to using daily instead of BID - fluticasone furoate-vilanterol (BREO ELLIPTA) 200-25 MCG/INH AEPB; Inhale 1 puff into the lungs daily.  Dispense: 60 each; Refill: 3 - methylPREDNISolone acetate (DEPO-MEDROL) injection 80 mg; Inject 1 mL (80 mg total) into the muscle once.  Evelina Dun, FNP

## 2016-03-28 NOTE — Addendum Note (Signed)
Addended by: Evelina Dun A on: 03/28/2016 08:38 AM   Modules accepted: Orders

## 2016-04-02 ENCOUNTER — Ambulatory Visit (INDEPENDENT_AMBULATORY_CARE_PROVIDER_SITE_OTHER): Payer: BLUE CROSS/BLUE SHIELD

## 2016-04-02 ENCOUNTER — Ambulatory Visit (INDEPENDENT_AMBULATORY_CARE_PROVIDER_SITE_OTHER): Payer: BLUE CROSS/BLUE SHIELD | Admitting: Family

## 2016-04-02 ENCOUNTER — Encounter: Payer: Self-pay | Admitting: Family

## 2016-04-02 VITALS — BP 138/84 | HR 72 | Temp 97.8°F | Ht 62.0 in | Wt 138.2 lb

## 2016-04-02 DIAGNOSIS — E119 Type 2 diabetes mellitus without complications: Secondary | ICD-10-CM | POA: Diagnosis not present

## 2016-04-02 DIAGNOSIS — Z1239 Encounter for other screening for malignant neoplasm of breast: Secondary | ICD-10-CM

## 2016-04-02 DIAGNOSIS — J329 Chronic sinusitis, unspecified: Secondary | ICD-10-CM | POA: Diagnosis not present

## 2016-04-02 DIAGNOSIS — R0602 Shortness of breath: Secondary | ICD-10-CM

## 2016-04-02 DIAGNOSIS — J309 Allergic rhinitis, unspecified: Secondary | ICD-10-CM

## 2016-04-02 DIAGNOSIS — J4522 Mild intermittent asthma with status asthmaticus: Secondary | ICD-10-CM | POA: Diagnosis not present

## 2016-04-02 DIAGNOSIS — H1010 Acute atopic conjunctivitis, unspecified eye: Secondary | ICD-10-CM

## 2016-04-02 MED ORDER — ASPIRIN EC 81 MG PO TBEC: 81.0000 mg | DELAYED_RELEASE_TABLET | Freq: Every day | ORAL | Status: DC

## 2016-04-02 NOTE — Progress Notes (Signed)
Subjective:    Patient ID: Jennifer Mullins, female    DOB: [birth year], 45 y.o.   MRN: [id number]  PT presents to the office today to recheck URI, asthma. PT was seen in the office on 03/28/16 and given Zpak, tessalon, hycodan, and started on Breo. PT states she feels like she want to go back to her Symbicort BID related to price of Breo.  Cough This is a recurrent problem. The current episode started 1 to 4 weeks ago. The problem has been waxing and waning. The problem occurs every few minutes. The cough is non-productive. Associated symptoms include headaches, shortness of breath and wheezing. Pertinent negatives include no chills, ear congestion, fever, nasal congestion or sore throat. The symptoms are aggravated by lying down. She has tried OTC cough suppressant, rest, steroid inhaler, leukotriene antagonists and oral steroids for the symptoms. The treatment provided moderate relief. Her past medical history is significant for asthma.  Diabetes She presents for her follow-up diabetic visit. She has type 2 diabetes mellitus. Her disease course has been stable. Hypoglycemia symptoms include headaches. Pertinent negatives for hypoglycemia include no confusion. Associated symptoms include blurred vision and visual change. Pertinent negatives for diabetes include no foot paresthesias and no foot ulcerations. Pertinent negatives for hypoglycemia complications include no blackouts. Symptoms are stable. Pertinent negatives for diabetic complications include no CVA, heart disease, nephropathy or peripheral neuropathy. Current diabetic treatment includes oral agent (monotherapy). She is following a generally unhealthy diet. (Pt not taking blood sugars at home )      Review of Systems  Constitutional: Negative for fever and chills.  HENT: Negative for sore throat.   Eyes: Positive for blurred vision.  Respiratory: Positive for cough, shortness of breath and wheezing.   Neurological: Positive for  headaches.  Psychiatric/Behavioral: Negative for confusion.  All other systems reviewed and are negative.      Objective:   Physical Exam  Constitutional: She is oriented to person, place, and time. She appears well-developed and well-nourished. No distress.  HENT:  Head: Normocephalic and atraumatic.  Right Ear: External ear normal.  Mouth/Throat: Oropharynx is clear and moist.  Eyes: Pupils are equal, round, and reactive to light.  Neck: Normal range of motion. Neck supple. No thyromegaly present.  Cardiovascular: Normal rate, regular rhythm, normal heart sounds and intact distal pulses.   No murmur heard. Pulmonary/Chest: Effort normal and breath sounds normal. No respiratory distress. She has no wheezes.  Abdominal: Soft. Bowel sounds are normal. She exhibits no distension. There is no tenderness.  Musculoskeletal: Normal range of motion. She exhibits no edema or tenderness.  Neurological: She is alert and oriented to person, place, and time.  Skin: Skin is warm and dry.  Psychiatric: She has a normal mood and affect. Her behavior is normal. Judgment and thought content normal.  Vitals reviewed.     BP 138/84 mmHg  Pulse 72  Temp(Src) 97.8 F (36.6 C) (Oral)  Ht 5\' 2"  (1.575 m)  Wt 138 lb 3.2 oz (62.687 kg)  BMI 25.27 kg/m2     Assessment & Plan:  1. Type 2 diabetes mellitus without complication, without long-term current use of insulin (HCC) -Continue low carb diet and exercise - Microalbumin / creatinine urine ratio - Ambulatory referral to Ophthalmology - aspirin EC 81 MG tablet; Take 1 tablet (81 mg total) by mouth daily.  Dispense: 90 tablet; Refill: 0  2. Extrinsic asthma, mild intermittent, with status asthmaticus -Continue Breo- Coupon card given to patient - DG Chest 2  View; Future  3. Chronic sinusitis, unspecified location  4. Allergic rhinoconjunctivitis  5. Breast cancer screening - [facility]  6. SOB (shortness of breath) -Asthma  prevention discussed - DG Chest 2 View; Future   Continue all meds Labs pending Health Maintenance reviewed Diet and exercise encouraged RTO 3 months  Evelina Dun, FNP

## 2016-04-02 NOTE — Patient Instructions (Signed)
Asthma, Adult Asthma is a recurring condition in which the airways tighten and narrow. Asthma can make it difficult to breathe. It can cause coughing, wheezing, and shortness of breath. Asthma episodes, also called asthma attacks, range from minor to life-threatening. Asthma cannot be cured, but medicines and lifestyle changes can help control it. CAUSES Asthma is believed to be caused by inherited (genetic) and environmental factors, but its exact cause is unknown. Asthma may be triggered by allergens, lung infections, or irritants in the air. Asthma triggers are different for each person. Common triggers include:   Animal dander.  Dust mites.  Cockroaches.  Pollen from trees or grass.  Mold.  Smoke.  Air pollutants such as dust, household cleaners, hair sprays, aerosol sprays, paint fumes, strong chemicals, or strong odors.  Cold air, weather changes, and winds (which increase molds and pollens in the air).  Strong emotional expressions such as crying or laughing hard.  Stress.  Certain medicines (such as aspirin) or types of drugs (such as beta-blockers).  Sulfites in foods and drinks. Foods and drinks that may contain sulfites include dried fruit, potato chips, and sparkling grape juice.  Infections or inflammatory conditions such as the flu, a cold, or an inflammation of the nasal membranes (rhinitis).  Gastroesophageal reflux disease (GERD).  Exercise or strenuous activity. SYMPTOMS Symptoms may occur immediately after asthma is triggered or many hours later. Symptoms include:  Wheezing.  Excessive nighttime or early morning coughing.  Frequent or severe coughing with a common cold.  Chest tightness.  Shortness of breath. DIAGNOSIS  The diagnosis of asthma is made by a review of your medical history and a physical exam. Tests may also be performed. These may include:  Lung function studies. These tests show how much air you breathe in and out.  Allergy  tests.  Imaging tests such as X-rays. TREATMENT  Asthma cannot be cured, but it can usually be controlled. Treatment involves identifying and avoiding your asthma triggers. It also involves medicines. There are 2 classes of medicine used for asthma treatment:   Controller medicines. These prevent asthma symptoms from occurring. They are usually taken every day.  Reliever or rescue medicines. These quickly relieve asthma symptoms. They are used as needed and provide short-term relief. Your health care provider will help you create an asthma action plan. An asthma action plan is a written plan for managing and treating your asthma attacks. It includes a list of your asthma triggers and how they may be avoided. It also includes information on when medicines should be taken and when their dosage should be changed. An action plan may also involve the use of a device called a peak flow meter. A peak flow meter measures how well the lungs are working. It helps you monitor your condition. HOME CARE INSTRUCTIONS   Take medicines only as directed by your health care provider. Speak with your health care provider if you have questions about how or when to take the medicines.  Use a peak flow meter as directed by your health care provider. Record and keep track of readings.  Understand and use the action plan to help minimize or stop an asthma attack without needing to seek medical care.  Control your home environment in the following ways to help prevent asthma attacks:  Do not smoke. Avoid being exposed to secondhand smoke.  Change your heating and air conditioning filter regularly.  Limit your use of fireplaces and wood stoves.  Get rid of pests (such as roaches   and mice) and their droppings.  Throw away plants if you see mold on them.  Clean your floors and dust regularly. Use unscented cleaning products.  Try to have someone else vacuum for you regularly. Stay out of rooms while they are  being vacuumed and for a short while afterward. If you vacuum, use a dust mask from a hardware store, a double-layered or microfilter vacuum cleaner bag, or a vacuum cleaner with a HEPA filter.  Replace carpet with wood, tile, or vinyl flooring. Carpet can trap dander and dust.  Use allergy-proof pillows, mattress covers, and box spring covers.  Wash bed sheets and blankets every week in hot water and dry them in a dryer.  Use blankets that are made of polyester or cotton.  Clean bathrooms and kitchens with bleach. If possible, have someone repaint the walls in these rooms with mold-resistant paint. Keep out of the rooms that are being cleaned and painted.  Wash hands frequently. SEEK MEDICAL CARE IF:   You have wheezing, shortness of breath, or a cough even if taking medicine to prevent attacks.  The colored mucus you cough up (sputum) is thicker than usual.  Your sputum changes from clear or white to yellow, green, gray, or bloody.  You have any problems that may be related to the medicines you are taking (such as a rash, itching, swelling, or trouble breathing).  You are using a reliever medicine more than 2-3 times per week.  Your peak flow is still at 50-79% of your personal best after following your action plan for 1 hour.  You have a fever. SEEK IMMEDIATE MEDICAL CARE IF:   You seem to be getting worse and are unresponsive to treatment during an asthma attack.  You are short of breath even at rest.  You get short of breath when doing very little physical activity.  You have difficulty eating, drinking, or talking due to asthma symptoms.  You develop chest pain.  You develop a fast heartbeat.  You have a bluish color to your lips or fingernails.  You are light-headed, dizzy, or faint.  Your peak flow is less than 50% of your personal best.   This information is not intended to replace advice given to you by your health care provider. Make sure you discuss any  questions you have with your health care provider.   Document Released: 11/04/2005 Document Revised: 07/26/2015 Document Reviewed: 06/03/2013 Elsevier Interactive Patient Education 2016 [employer]  

## 2016-04-03 LAB — MICROALBUMIN / CREATININE URINE RATIO
Creatinine, Urine: 77 mg/dL
MICROALB/CREAT RATIO: <3.9 mg/g creat (ref 0.0–30.0)
Microalbumin, Urine: <3 ug/mL

## 2016-04-11 ENCOUNTER — Other Ambulatory Visit: Payer: Self-pay | Admitting: Nurse Practitioner

## 2016-04-11 NOTE — Telephone Encounter (Signed)
Please call in xanax with 0 refills 

## 2016-04-11 NOTE — Telephone Encounter (Signed)
rx called into pharmacy

## 2016-05-27 ENCOUNTER — Telehealth: Payer: Self-pay | Admitting: Nurse Practitioner

## 2016-05-27 NOTE — Telephone Encounter (Signed)
Samples placed at the front desk. 

## 2016-05-27 NOTE — Telephone Encounter (Signed)
Pt came by to get the samples. Will close encounter.

## 2016-06-07 ENCOUNTER — Other Ambulatory Visit: Payer: Self-pay | Admitting: Nurse Practitioner

## 2016-06-07 NOTE — Telephone Encounter (Signed)
Seen C [facility]ks 5/17 - have nurse call to pharm

## 2016-06-07 NOTE — Telephone Encounter (Signed)
Patient requesting Xanax with one refill ,please send to [facility].

## 2016-06-08 NOTE — Telephone Encounter (Signed)
Prescription was called in to [facility], patient informed via voicemail

## 2016-06-28 ENCOUNTER — Other Ambulatory Visit: Payer: Self-pay | Admitting: Nurse Practitioner

## 2016-07-03 ENCOUNTER — Telehealth: Payer: Self-pay | Admitting: Nurse Practitioner

## 2016-07-03 NOTE — Telephone Encounter (Signed)
Patient aware that samples are ready for pick up 

## 2016-08-02 ENCOUNTER — Other Ambulatory Visit: Payer: Self-pay | Admitting: *Deleted

## 2016-08-02 NOTE — Telephone Encounter (Signed)
Patient aware she will need to be seen  

## 2016-08-02 NOTE — Telephone Encounter (Signed)
Seen Christy in [place] - coverage is [place]  If approved route to pool for nurse to phone in - [facility]

## 2016-08-02 NOTE — Telephone Encounter (Signed)
Patient needs an appointment with Adonis Brook, she was supposed to come back in 3 months from May anyways. Please get her an appointment with her.

## 2016-08-05 ENCOUNTER — Other Ambulatory Visit: Payer: Self-pay

## 2016-08-05 ENCOUNTER — Telehealth: Payer: Self-pay | Admitting: Family

## 2016-08-05 MED ORDER — ALPRAZOLAM 0.5 MG PO TABS: 0.5000 mg | ORAL_TABLET | Freq: Two times a day (BID) | ORAL | 0 refills | Status: DC | PRN

## 2016-08-05 NOTE — Telephone Encounter (Signed)
Xanax refill called to [facility] voice mail. Patient aware.

## 2016-08-19 ENCOUNTER — Ambulatory Visit: Payer: BLUE CROSS/BLUE SHIELD | Admitting: Nurse Practitioner

## 2016-08-30 ENCOUNTER — Encounter: Payer: Self-pay | Admitting: Nurse Practitioner

## 2016-09-02 ENCOUNTER — Ambulatory Visit: Payer: BLUE CROSS/BLUE SHIELD | Admitting: Family

## 2016-09-03 ENCOUNTER — Other Ambulatory Visit: Payer: Self-pay | Admitting: Family

## 2016-09-03 DIAGNOSIS — B373 Candidiasis of vulva and vagina: Secondary | ICD-10-CM

## 2016-09-03 DIAGNOSIS — B3731 Acute candidiasis of vulva and vagina: Secondary | ICD-10-CM

## 2016-09-05 ENCOUNTER — Encounter: Payer: Self-pay | Admitting: Nurse Practitioner

## 2016-09-05 NOTE — Telephone Encounter (Signed)
Patient NTBS for follow up and lab work  

## 2016-09-06 ENCOUNTER — Ambulatory Visit: Payer: BLUE CROSS/BLUE SHIELD | Admitting: Family

## 2016-09-06 ENCOUNTER — Encounter: Payer: Self-pay | Admitting: Nurse Practitioner

## 2016-09-06 MED ORDER — ALPRAZOLAM 0.5 MG PO TABS: 0.5000 mg | ORAL_TABLET | Freq: Two times a day (BID) | ORAL | 0 refills | Status: DC | PRN

## 2016-09-06 NOTE — Telephone Encounter (Signed)
Please call rx in. This is the last rx before being seen in office for appt

## 2016-09-06 NOTE — Telephone Encounter (Signed)
Pt says thank you for refill will see you Friday. Can the other be refilled too. She had a vaginal biopsy done today & started itching yesterday.

## 2016-09-06 NOTE — Telephone Encounter (Signed)
Refill called to [facility]. Pt aware of the one refilled

## 2016-09-09 MED ORDER — FLUCONAZOLE 150 MG PO TABS: 150.0000 mg | ORAL_TABLET | Freq: Once | ORAL | 0 refills | Status: AC

## 2016-09-12 ENCOUNTER — Ambulatory Visit: Payer: BLUE CROSS/BLUE SHIELD | Admitting: Family

## 2016-09-20 ENCOUNTER — Ambulatory Visit: Payer: BLUE CROSS/BLUE SHIELD | Admitting: Family

## 2016-09-27 ENCOUNTER — Ambulatory Visit: Payer: BLUE CROSS/BLUE SHIELD | Admitting: Family

## 2016-10-04 ENCOUNTER — Ambulatory Visit (INDEPENDENT_AMBULATORY_CARE_PROVIDER_SITE_OTHER): Payer: BLUE CROSS/BLUE SHIELD | Admitting: Nurse Practitioner

## 2016-10-04 ENCOUNTER — Encounter: Payer: Self-pay | Admitting: Nurse Practitioner

## 2016-10-04 VITALS — BP 127/74 | HR 84 | Temp 97.1°F | Ht 62.0 in | Wt 142.0 lb

## 2016-10-04 DIAGNOSIS — J329 Chronic sinusitis, unspecified: Secondary | ICD-10-CM | POA: Diagnosis not present

## 2016-10-04 DIAGNOSIS — E785 Hyperlipidemia, unspecified: Secondary | ICD-10-CM | POA: Diagnosis not present

## 2016-10-04 DIAGNOSIS — E282 Polycystic ovarian syndrome: Secondary | ICD-10-CM | POA: Diagnosis not present

## 2016-10-04 DIAGNOSIS — F411 Generalized anxiety disorder: Secondary | ICD-10-CM

## 2016-10-04 DIAGNOSIS — D649 Anemia, unspecified: Secondary | ICD-10-CM

## 2016-10-04 DIAGNOSIS — R5383 Other fatigue: Secondary | ICD-10-CM | POA: Diagnosis not present

## 2016-10-04 DIAGNOSIS — K219 Gastro-esophageal reflux disease without esophagitis: Secondary | ICD-10-CM

## 2016-10-04 DIAGNOSIS — J4522 Mild intermittent asthma with status asthmaticus: Secondary | ICD-10-CM | POA: Diagnosis not present

## 2016-10-04 DIAGNOSIS — R609 Edema, unspecified: Secondary | ICD-10-CM

## 2016-10-04 DIAGNOSIS — E042 Nontoxic multinodular goiter: Secondary | ICD-10-CM | POA: Diagnosis not present

## 2016-10-04 DIAGNOSIS — I10 Essential (primary) hypertension: Secondary | ICD-10-CM

## 2016-10-04 LAB — BAYER DCA HB A1C WAIVED: HB A1C (BAYER DCA - WAIVED): 5.5 % (ref <7.0)

## 2016-10-04 MED ORDER — LISINOPRIL 10 MG PO TABS: 10.0000 mg | ORAL_TABLET | Freq: Every day | ORAL | 3 refills | Status: DC

## 2016-10-04 MED ORDER — PANTOPRAZOLE SODIUM 40 MG PO TBEC: 40.0000 mg | DELAYED_RELEASE_TABLET | Freq: Every day | ORAL | 5 refills | Status: DC

## 2016-10-04 MED ORDER — METFORMIN HCL 500 MG PO TABS: 500.0000 mg | ORAL_TABLET | Freq: Two times a day (BID) | ORAL | 5 refills | Status: DC

## 2016-10-04 MED ORDER — FAMOTIDINE 20 MG PO TABS: 20.0000 mg | ORAL_TABLET | Freq: Every day | ORAL | 6 refills | Status: DC

## 2016-10-04 MED ORDER — ALPRAZOLAM 0.5 MG PO TABS: 0.5000 mg | ORAL_TABLET | Freq: Two times a day (BID) | ORAL | 1 refills | Status: DC | PRN

## 2016-10-04 MED ORDER — FUROSEMIDE 20 MG PO TABS: 20.0000 mg | ORAL_TABLET | Freq: Every day | ORAL | 5 refills | Status: DC

## 2016-10-04 MED ORDER — PRAVASTATIN SODIUM 40 MG PO TABS: 40.0000 mg | ORAL_TABLET | Freq: Every day | ORAL | 1 refills | Status: DC

## 2016-10-04 NOTE — Addendum Note (Signed)
Addended by: Chevis Pretty on: 10/04/2016 11:41 AM   Modules accepted: Orders

## 2016-10-04 NOTE — Progress Notes (Signed)
Subjective:    Patient ID: Jennifer Mullins, female    DOB: [birth year], 45 y.o.   MRN: [id number]   Patient here today for follow up of chronic medical problems. Patient has been seeing [facility] since February 2017  Outpatient Encounter Prescriptions as of 10/04/2016  Medication Sig  . albuterol (PROVENTIL HFA;VENTOLIN HFA) 108 (90 BASE) MCG/ACT inhaler Inhale 1-2 puffs into the lungs daily as needed. Rescue inhaler  . albuterol (PROVENTIL) (2.5 MG/3ML) 0.083% nebulizer solution USE ONE VIAL IN NEBULIZER EVERY 6 HOURS AS NEEDED FOR WHEEZING OR SHORTNESS OF BREATH  . ALPRAZolam (XANAX) 0.5 MG tablet Take 1 tablet (0.5 mg total) by mouth 2 (two) times daily as needed.  Marland Kitchen aspirin EC 81 MG tablet Take 1 tablet (81 mg total) by mouth daily.  . bimatoprost (LUMIGAN) 0.01 % SOLN 1 drop at bedtime.  . budesonide-formoterol (SYMBICORT) 160-4.5 MCG/ACT inhaler Inhale 2 puffs into the lungs 2 (two) times daily.  . carboxymethylcellulose (REFRESH PLUS) 0.5 % SOLN 1 drop 4 (four) times daily.  . cyproheptadine (PERIACTIN) 4 MG tablet Take 1 tablet (4 mg total) by mouth at bedtime.  . famotidine (PEPCID) 20 MG tablet Take 1 tablet (20 mg total) by mouth at bedtime.  . fluticasone furoate-vilanterol (BREO ELLIPTA) 200-25 MCG/INH AEPB Inhale 1 puff into the lungs daily.  . furosemide (LASIX) 20 MG tablet Take 1 tablet (20 mg total) by mouth daily.  . hydrOXYzine (ATARAX/VISTARIL) 50 MG tablet Take 1 tablet (50 mg total) by mouth 3 (three) times daily as needed for itching.  Marland Kitchen ipratropium (ATROVENT) 0.03 % nasal spray Place 2 sprays into both nostrils every 12 (twelve) hours.  . metFORMIN (GLUCOPHAGE) 500 MG tablet Take 1 tablet (500 mg total) by mouth 2 (two) times daily with a meal.  . montelukast (SINGULAIR) 10 MG tablet Take 1 tablet (10 mg total) by mouth at bedtime.  . Nebivolol HCl (BYSTOLIC PO) Take by mouth.  . Olopatadine HCl 0.6 % SOLN Place into the nose.  . pantoprazole (PROTONIX) 40 MG  tablet Take 1 tablet (40 mg total) by mouth daily.  . pravastatin (PRAVACHOL) 40 MG tablet Take 1 tablet (40 mg total) by mouth daily.    Diabetes  She presents for her follow-up diabetic visit. She has type 2 diabetes mellitus. No MedicAlert identification noted. Her disease course has been stable. There are no hypoglycemic associated symptoms. Pertinent negatives for diabetes include no chest pain, no polydipsia, no polyphagia, no polyuria and no weakness. There are no hypoglycemic complications. There are no diabetic complications. Risk factors for coronary artery disease include dyslipidemia and hypertension. Her weight is stable. She is following a diabetic diet. When asked about meal planning, she reported none. She has not had a previous visit with a dietitian. She rarely participates in exercise. Home blood sugar record trend: patien tdoes not check blood sugars at home. An ACE inhibitor/angiotensin II receptor blocker is being taken. She does not see a podiatrist.Eye exam is not current.  Hypertension  This is a chronic problem. The current episode started more than 1 year ago. The problem is controlled. Pertinent negatives include no chest pain. Risk factors for coronary artery disease include diabetes mellitus, dyslipidemia and post-menopausal state. Past treatments include beta blockers. The current treatment provides moderate improvement. Compliance problems include diet and exercise.   Hyperlipidemia  This is a chronic problem. The current episode started more than 1 year ago. The problem is controlled. Recent lipid tests were reviewed and are  high. Exacerbating diseases include diabetes. She has no history of hypothyroidism or obesity. Pertinent negatives include no chest pain. She is currently on no antihyperlipidemic treatment. Risk factors for coronary artery disease include diabetes mellitus, hypertension and post-menopausal.  GERD Currently on pepcid and protonix- combination keeps her  symptoms under control Asthma/ allergies/Chronic sinusitis Symbicort, flonase and singulair keeps her under control- occasional wheezes but for the most art doing well. Seeing specialist ( asthma and allergist ) every 6 months. She has had surgery on her sinus in the past and still has los of congestion GAD Cannot function without her xanax- takes several times a day Nontoxic goiter Seeing Dr. Lorrene Reid- labs ordered today PCOS On metformin for diabetes which is suppose to help with PCOS- she denies any pelvic cramping. She has recently seen GYN doctor in which they did u/s and bx showed just has uterine fibroids. Having heavy bleeding at times. Anemia Patient says she has been craving ice latey and that is what she had when she was weak in the past.     Review of Systems  Constitutional: Negative.   Cardiovascular: Negative for chest pain.  Endocrine: Negative for polydipsia, polyphagia and polyuria.  Neurological: Negative for weakness.  Psychiatric/Behavioral: Negative.   All other systems reviewed and are negative.      Objective:   Physical Exam  Constitutional: She is oriented to person, place, and time. She appears well-developed and well-nourished.  HENT:  Nose: Nose normal.  Mouth/Throat: Oropharynx is clear and moist.  Eyes: EOM are normal.  Neck: Trachea normal, normal range of motion and full passive range of motion without pain. Neck supple. No JVD present. Carotid bruit is not present. No thyromegaly present.  Cardiovascular: Normal rate, regular rhythm, normal heart sounds and intact distal pulses.  Exam reveals no gallop and no friction rub.   No murmur heard. Pulmonary/Chest: Effort normal and breath sounds normal.  Abdominal: Soft. Bowel sounds are normal. She exhibits no distension and no mass. There is no tenderness.  Musculoskeletal: Normal range of motion.  Lymphadenopathy:    She has no cervical adenopathy.  Neurological: She is alert and oriented to  person, place, and time. She has normal reflexes.  Skin: Skin is warm and dry.  Psychiatric: She has a normal mood and affect. Her behavior is normal. Judgment and thought content normal.   BP 127/74   Pulse 84   Temp 97.1 F (36.2 C) (Oral)   Ht '5\' 2"'  (1.575 m)   Wt 142 lb (64.4 kg)   BMI 25.97 kg/m    hgba1c- 5.5%     Assessment & Plan:  1. Hyperlipidemia LDL goal <100 Low fat diet - Lipid panel - pravastatin (PRAVACHOL) 40 MG tablet; Take 1 tablet (40 mg total) by mouth daily.  Dispense: 90 tablet; Refill: 1  2. Anemia, unspecified type - CBC with Differential/Platelet  3. Fatigue, unspecified type - Thyroid Panel With TSH  4. Essential hypertension Low sodium diet cnaged form bystolic to lisinopril due to insurab=nce reasons Keep watch of blod pressure at home - CMP14+EGFR - lisinopril (PRINIVIL,ZESTRIL) 10 MG tablet; Take 1 tablet (10 mg total) by mouth daily.  Dispense: 90 tablet; Refill: 3  5. Chronic sinusitis, unspecified location  6. 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 Gastroesophageal reflux disease without esophagitis Avoid spicy foods Do not eat 2 hours prior to bedtime - pantoprazole (PROTONIX) 40 MG tablet; Take 1 tablet (40 mg total) by mouth daily.  Dispense: 30 tablet;  Refill: 5 - famotidine (PEPCID) 20 MG tablet; Take 1 tablet (20 mg total) by mouth at bedtime.  Dispense: 30 tablet; Refill: 6  8. PCOS (polycystic ovarian syndrome) Keep folow up with GYN - Bayer DCA Hb A1c Waived - metFORMIN (GLUCOPHAGE) 500 MG tablet; Take 1 tablet (500 mg total) by mouth 2 (two) times daily with a meal.  Dispense: 60 tablet; Refill: 5  9. Nontoxic multinodular goiter  10. GAD (generalized anxiety disorder) Stress management - ALPRAZolam (XANAX) 0.5 MG tablet; Take 1 tablet (0.5 mg total) by mouth 2 (two) times daily as needed.  Dispense: 60 tablet; Refill: 1  11. Body fluid retention - furosemide (LASIX) 20 MG tablet; Take 1  tablet (20 mg total) by mouth daily.  Dispense: 30 tablet; Refill: 5    Labs pending Health maintenance reviewed Diet and exercise encouraged Continue all meds Follow up  In 3 month   [place], FNP

## 2016-10-05 LAB — CBC WITH DIFFERENTIAL/PLATELET
Basophils Absolute: 0 10*3/uL (ref 0.0–0.2)
Basos: 0 %
EOS (ABSOLUTE): 0.2 10*3/uL (ref 0.0–0.4)
Eos: 4 %
Hematocrit: 29.7 % — ABNORMAL LOW (ref 34.0–46.6)
Hemoglobin: 9.5 g/dL — ABNORMAL LOW (ref 11.1–15.9)
Immature Grans (Abs): 0 10*3/uL (ref 0.0–0.1)
Immature Granulocytes: 0 %
Lymphocytes Absolute: 1.3 10*3/uL (ref 0.7–3.1)
Lymphs: 35 %
MCH: 24.1 pg — ABNORMAL LOW (ref 26.6–33.0)
MCHC: 32 g/dL (ref 31.5–35.7)
MCV: 75 fL — ABNORMAL LOW (ref 79–97)
Monocytes Absolute: 0.3 10*3/uL (ref 0.1–0.9)
Monocytes: 9 %
Neutrophils Absolute: 1.9 10*3/uL (ref 1.4–7.0)
Neutrophils: 52 %
Platelets: 274 10*3/uL (ref 150–379)
RBC: 3.94 x10E6/uL (ref 3.77–5.28)
RDW: 17.3 % — ABNORMAL HIGH (ref 12.3–15.4)
WBC: 3.6 10*3/uL (ref 3.4–10.8)

## 2016-10-05 LAB — LIPID PANEL
Chol/HDL Ratio: 2.8 ratio units (ref 0.0–4.4)
Cholesterol, Total: 177 mg/dL (ref 100–199)
HDL: 63 mg/dL (ref >39)
LDL Calculated: 77 mg/dL (ref 0–99)
Triglycerides: 186 mg/dL — ABNORMAL HIGH (ref 0–149)
VLDL Cholesterol Cal: 37 mg/dL (ref 5–40)

## 2016-10-05 LAB — THYROID PANEL WITH TSH
Free Thyroxine Index: 2.1 (ref 1.2–4.9)
T3 Uptake Ratio: 23 % — ABNORMAL LOW (ref 24–39)
T4, Total: 9 ug/dL (ref 4.5–12.0)
TSH: 0.631 u[IU]/mL (ref 0.450–4.500)

## 2016-10-05 LAB — CMP14+EGFR
ALT: 19 IU/L (ref 0–32)
AST: 21 IU/L (ref 0–40)
Albumin/Globulin Ratio: 1.6 (ref 1.2–2.2)
Albumin: 4.1 g/dL (ref 3.5–5.5)
Alkaline Phosphatase: 65 IU/L (ref 39–117)
BUN/Creatinine Ratio: 9 (ref 9–23)
BUN: 6 mg/dL (ref 6–24)
Bilirubin Total: <0.2 mg/dL (ref 0.0–1.2)
CO2: 20 mmol/L (ref 18–29)
Calcium: 8.5 mg/dL — ABNORMAL LOW (ref 8.7–10.2)
Chloride: 103 mmol/L (ref 96–106)
Creatinine, Ser: 0.68 mg/dL (ref 0.57–1.00)
GFR calc Af Amer: 120 mL/min/{1.73_m2} (ref >59)
GFR calc non Af Amer: 105 mL/min/{1.73_m2} (ref >59)
Globulin, Total: 2.5 g/dL (ref 1.5–4.5)
Glucose: 104 mg/dL — ABNORMAL HIGH (ref 65–99)
Potassium: 4 mmol/L (ref 3.5–5.2)
Sodium: 141 mmol/L (ref 134–144)
Total Protein: 6.6 g/dL (ref 6.0–8.5)

## 2016-10-07 ENCOUNTER — Telehealth: Payer: Self-pay | Admitting: Nurse Practitioner

## 2016-10-07 ENCOUNTER — Other Ambulatory Visit: Payer: Self-pay | Admitting: *Deleted

## 2016-10-07 ENCOUNTER — Other Ambulatory Visit: Payer: Self-pay | Admitting: Nurse Practitioner

## 2016-10-07 DIAGNOSIS — M25579 Pain in unspecified ankle and joints of unspecified foot: Secondary | ICD-10-CM

## 2016-10-07 DIAGNOSIS — M79606 Pain in leg, unspecified: Secondary | ICD-10-CM

## 2016-10-07 NOTE — Telephone Encounter (Signed)
Patient aware of current lab results but upset that her blood was not checked for lupus or arthritis.  She is having a lot of leg pain and said she ask for extensive tests to be done at last visit. Marland Kitchen  She was advised that she would probably need to see provider and have a different panel of labs done to get checked for lupus and arthritis. She says her insurance would pay for those tests and she wants them done!  Please advise.

## 2016-10-07 NOTE — Telephone Encounter (Signed)
I disagree with doing lupus test- not warranted- but can come in and get drawn-take daily iron supplement for iron levels

## 2016-10-07 NOTE — Telephone Encounter (Signed)
Aware to come back for new blood work.

## 2016-10-08 ENCOUNTER — Other Ambulatory Visit: Payer: BLUE CROSS/BLUE SHIELD

## 2016-10-08 DIAGNOSIS — M25579 Pain in unspecified ankle and joints of unspecified foot: Secondary | ICD-10-CM

## 2016-10-08 DIAGNOSIS — M79606 Pain in leg, unspecified: Secondary | ICD-10-CM

## 2016-10-09 LAB — ANA: Anti Nuclear Antibody(ANA): NEGATIVE

## 2016-10-09 LAB — ARTHRITIS PANEL
Basophils Absolute: 0 10*3/uL (ref 0.0–0.2)
Basos: 0 %
EOS (ABSOLUTE): 0.1 10*3/uL (ref 0.0–0.4)
Eos: 3 %
Hematocrit: 29.7 % — ABNORMAL LOW (ref 34.0–46.6)
Hemoglobin: 9.4 g/dL — ABNORMAL LOW (ref 11.1–15.9)
Immature Grans (Abs): 0 10*3/uL (ref 0.0–0.1)
Immature Granulocytes: 0 %
Lymphocytes Absolute: 2 10*3/uL (ref 0.7–3.1)
Lymphs: 43 %
MCH: 23.8 pg — ABNORMAL LOW (ref 26.6–33.0)
MCHC: 31.6 g/dL (ref 31.5–35.7)
MCV: 75 fL — ABNORMAL LOW (ref 79–97)
Monocytes Absolute: 0.5 10*3/uL (ref 0.1–0.9)
Monocytes: 11 %
Neutrophils Absolute: 2.1 10*3/uL (ref 1.4–7.0)
Neutrophils: 43 %
Platelets: 284 10*3/uL (ref 150–379)
RBC: 3.95 x10E6/uL (ref 3.77–5.28)
RDW: 17.9 % — ABNORMAL HIGH (ref 12.3–15.4)
Rhuematoid fact SerPl-aCnc: <10 IU/mL (ref 0.0–13.9)
Sed Rate: 11 mm/hr (ref 0–32)
Uric Acid: 5 mg/dL (ref 2.5–7.1)
WBC: 4.8 10*3/uL (ref 3.4–10.8)

## 2016-10-09 LAB — RPR: RPR Ser Ql: NONREACTIVE

## 2016-10-17 ENCOUNTER — Encounter (INDEPENDENT_AMBULATORY_CARE_PROVIDER_SITE_OTHER): Payer: Self-pay | Admitting: Orthopaedic Surgery

## 2016-10-17 ENCOUNTER — Ambulatory Visit (INDEPENDENT_AMBULATORY_CARE_PROVIDER_SITE_OTHER): Payer: BLUE CROSS/BLUE SHIELD | Admitting: Orthopaedic Surgery

## 2016-10-17 VITALS — BP 127/74 | HR 84 | Ht 62.0 in | Wt 142.0 lb

## 2016-10-17 DIAGNOSIS — M4722 Other spondylosis with radiculopathy, cervical region: Secondary | ICD-10-CM | POA: Diagnosis not present

## 2016-10-17 NOTE — Progress Notes (Signed)
Office Visit Note   Patient: Jennifer Mullins           Date of Birth: 1971-05-28           MRN: [id number] Visit Date: 10/17/2016              Requested by: Chevis Pretty, [place], [place] [address] PCP: Chevis Pretty, FNP   Assessment & Plan: Visit Diagnoses:  1. Osteoarthritis of spine with radiculopathy, cervical region            Cervical spondylosis C5-6 ,C6-7  Plan: Patient had deferred surgery as previously recommended for the last 9 months. Her symptoms have progressed and increased pain was on a daily basis she has to grab and hold onto her neck frequently. She's taken medication without relief including prednisone packs which gave her relief for just a few days and then recurrence of symptoms. She's been through therapy taken anti-inflammatories and muscle relaxants. She is not working has applied for disability and has a hearing coming up. Patient states that pain is progressed the point where she now wants to proceed with surgery. MRI scan was reviewed with her and I gave her a copy of the report. Patient has spondylosis with disc protrusion at C5-6 C6-7 MRI scan 2016 first 2014 shows persistent disc protrusion with narrowing of the anterior epidural space with disc protrusions just touching the cord narrowing down to 10.4 mm at C5-6 and C6-7. Small bulge at C4-5 that does not cause compression. Facet degenerative changes. Follow-Up Instructions: No Follow-up on file.   Orders:  No orders of the defined types were placed in this encounter.  No orders of the defined types were placed in this encounter.     Procedures: No procedures performed   Clinical Data: No additional findings.   Subjective: Chief Complaint  Patient presents with  . Neck - Pain    Patient presents with ongoing neck problems. She has a history of spondylosis, DDD, foraminal narrowing, disc protrusion at C5-6. Etc.  She would like to discuss two level cervical  fusion.    Review of Systems  Constitutional: Negative for chills and diaphoresis.  HENT: Positive for congestion and sinus pressure. Negative for ear discharge, ear pain and nosebleeds.        Positive for history of thyroid goiter  Eyes: Negative for discharge and visual disturbance.  Respiratory: Negative for cough, choking and shortness of breath.   Cardiovascular: Negative for chest pain and palpitations.  Gastrointestinal: Negative for abdominal distention and abdominal pain.  Endocrine: Negative for cold intolerance and heat intolerance.  Genitourinary: Negative for flank pain and hematuria.  Musculoskeletal:       Positive for cervical spondylosis symptomatic C5-6 C6-7 disc bulge document by previous MRI scan 2017.  Skin: Negative for rash and wound.  Neurological: Negative for seizures and speech difficulty.  Hematological: Negative for adenopathy. Does not bruise/bleed easily.  Psychiatric/Behavioral: Negative for agitation and suicidal ideas.       Positive for history of anxiety     Objective: Vital Signs: BP 127/74   Pulse 84   Ht 5\' 2"  (1.575 m)   Wt 142 lb (64.4 kg)   BMI 25.97 kg/m   Physical Exam  Constitutional: She is oriented to person, place, and time. She appears well-developed.  HENT:  Head: Normocephalic.  Right Ear: External ear normal.  Left Ear: External ear normal.  Eyes: Pupils are equal, round, and reactive to light.  Neck: No  tracheal deviation present. No thyromegaly present.  Cardiovascular: Normal rate and regular rhythm.   Pulmonary/Chest: Effort normal.  Abdominal: Soft. There is no tenderness. There is no rebound.  Musculoskeletal:  Comparison is present with cervical compression bilateral brachioplexus tenderness. Negative Spurling. Upper extremity reflexes are 2+ and symmetrical. With brachioplexus pressure she has numbness and tingling that radiates to the radial side of both hands and into the long finger.  Neurological: She is  alert and oriented to person, place, and time.  Skin: Skin is warm and dry.  Psychiatric: She has a normal mood and affect. Her behavior is normal.    Ortho Exam native shoulder impingement right and left. Biceps triceps are strong no atrophy noted. No wrist flexion or extension weakness. Decreased sensation radial side of her hand over the C6 distribution. Interossei are strong for fundi several my EPL are normal. Upper and lower extremity pulses are 2+ and symmetrical no rash over exposed skin no pitting edema.  Specialty Comments:  No specialty comments available.  Imaging: No results found.   PMFS History: Patient Active Problem List   Diagnosis Date Noted  . Allergic rhinoconjunctivitis 07/28/2015  . Chronic headache 07/28/2015  . PCOS (polycystic ovarian syndrome) 06/20/2015  . 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 Hyperlipidemia LDL goal <100 06/20/2015  . GAD (generalized anxiety disorder) 05/18/2014  . GERD (gastroesophageal reflux disease) 05/18/2014  . Body fluid retention 07/07/2013  . Chronic sinusitis 07/02/2013  . Extrinsic asthma 05/20/2013  . HTN (hypertension) 05/20/2013  . Diabetes ([place]) 05/20/2013   Past Medical History:  Diagnosis Date  . Asthma   . Diabetes mellitus without complication ([place])   . GAD (generalized anxiety disorder)   . Hyperlipidemia   . Tachycardia     Family History  Problem Relation Age of Onset  . Hypertension Mother   . Hypertension Father     Past Surgical History:  Procedure Laterality Date  . BREAST SURGERY    . CARPAL TUNNEL RELEASE    . EXPLORATORY LAPAROTOMY    . NASAL SINUS SURGERY  07/22/13   Dr. Redmond Pulling in [place] History  . Unemployed    Social History Main Topics  . Smoking status: Former Smoker    Packs/day: 0.25    Years: 5.00    Types: Cigarettes    Quit date: 11/18/1989  . Smokeless tobacco: Never Used  . Alcohol use Yes     Comment: occasional  . Drug use: No  .  Sexual activity: Not on file

## 2016-10-22 ENCOUNTER — Telehealth: Payer: Self-pay | Admitting: Nurse Practitioner

## 2016-10-22 ENCOUNTER — Telehealth (INDEPENDENT_AMBULATORY_CARE_PROVIDER_SITE_OTHER): Payer: Self-pay | Admitting: Orthopaedic Surgery

## 2016-10-22 NOTE — Telephone Encounter (Signed)
Patient wants letter for food stamps.  Needs to state "she has been referred to several doctors for her conditions.  Dr. Dorris Fetch for thyroid nodule, Dr.Yates for low back pain and cervical spondylosis.  Also needs to state her medical conditions and medications would affect her ability to work.  Needs to have this by Dec 11th.  Please call when ready.

## 2016-10-22 NOTE — Telephone Encounter (Signed)
Patient calling to let you know that she would like to schedule surgery before the end of the year because she has met deductible.  She was seen Nov 30th to discuss surgery and was told to contact you.  She mentioned that she has left one message and was told if she had not heard from you by Monday to call.

## 2016-10-22 NOTE — Telephone Encounter (Signed)
mandy please take care of

## 2016-10-24 ENCOUNTER — Telehealth: Payer: Self-pay | Admitting: Nurse Practitioner

## 2016-10-24 NOTE — Telephone Encounter (Signed)
appt scheduled for sinus infection Pt notified

## 2016-10-24 NOTE — Telephone Encounter (Signed)
I spoke with Ms. Womack. Advised of the dates that I had available before the end of the year were 12/22 or 11/13/2016. She is going to make some phone calls and try to see when she can line up transportation. She will call me back when she decides when she would like to have it done.

## 2016-10-28 ENCOUNTER — Encounter: Payer: Self-pay | Admitting: Nurse Practitioner

## 2016-10-28 ENCOUNTER — Ambulatory Visit (INDEPENDENT_AMBULATORY_CARE_PROVIDER_SITE_OTHER): Payer: BLUE CROSS/BLUE SHIELD | Admitting: Nurse Practitioner

## 2016-10-28 VITALS — BP 133/72 | HR 82 | Temp 97.8°F | Ht 62.0 in | Wt 144.0 lb

## 2016-10-28 DIAGNOSIS — J0101 Acute recurrent maxillary sinusitis: Secondary | ICD-10-CM

## 2016-10-28 MED ORDER — HYDROCODONE-HOMATROPINE 5-1.5 MG/5ML PO SYRP: 5.0000 mL | ORAL_SOLUTION | Freq: Four times a day (QID) | ORAL | 0 refills | Status: DC | PRN

## 2016-10-28 MED ORDER — AMOXICILLIN-POT CLAVULANATE 875-125 MG PO TABS: 1.0000 | ORAL_TABLET | Freq: Two times a day (BID) | ORAL | 0 refills | Status: DC

## 2016-10-28 NOTE — Patient Instructions (Signed)

## 2016-10-28 NOTE — Progress Notes (Signed)
Subjective:     Jennifer Mullins is a 45 y.o. female who presents for evaluation of sinus pain. Symptoms include: clear rhinorrhea, congestion, cough, headaches, nasal congestion and sinus pressure. Onset of symptoms was 2 weeks ago. Symptoms have been gradually worsening since that time. Past history is significant for occasional episodes of bronchitis. Patient is a non-smoker.  The following portions of the patient's history were reviewed and updated as appropriate: allergies, current medications, past family history, past medical history, past social history, past surgical history and problem list.  Review of Systems Pertinent items noted in HPI and remainder of comprehensive ROS otherwise negative.   Objective:    BP 133/72   Pulse 82   Temp 97.8 F (36.6 C) (Oral)   Ht 5\' 2"  (1.575 m)   Wt 144 lb (65.3 kg)   BMI 26.34 kg/m  General appearance: alert, cooperative and mild distress Eyes: conjunctivae/corneas clear. PERRL, EOM's intact. Fundi benign. Ears: normal TM's and external ear canals both ears Nose: clear discharge, moderate congestion, turbinates red, sinus tenderness bilateral Throat: lips, mucosa, and tongue normal; teeth and gums normal Neck: no adenopathy, no carotid bruit, no JVD, supple, symmetrical, trachea midline and thyroid not enlarged, symmetric, no tenderness/mass/nodules Lungs: clear to auscultation bilaterally and dry cough Heart: regular rate and rhythm, S1, S2 normal, no murmur, click, rub or gallop    Assessment:    Acute bacterial sinusitis.    Plan:     1. Take meds as prescribed 2. Use a cool mist humidifier especially during the winter months and when heat has been humid. 3. Use saline nose sprays frequently 4. Saline irrigations of the nose can be very helpful if done frequently.  * 4X daily for 1 week*  * Use of a nettie pot can be helpful with this. Follow directions with this* 5. Drink plenty of fluids 6. Keep thermostat turn down low 7.For  any cough or congestion  Use plain Mucinex- regular strength or max strength is fine   * Children- consult with Pharmacist for dosing 8. For fever or aces or pains- take tylenol or ibuprofen appropriate for age and weight.  * for fevers greater than 101 orally you may alternate ibuprofen and tylenol every  3 hours.   Patient said she was NOT allergic to hydrocodone  Meds ordered this encounter  Medications  . amoxicillin-clavulanate (AUGMENTIN) 875-125 MG tablet    Sig: Take 1 tablet by mouth 2 (two) times daily.    Dispense:  20 tablet    Refill:  0    Order Specific Question:   Supervising Provider    Answer:   VINCENT, CAROL L [[id number]]  . HYDROcodone-homatropine (HYCODAN) 5-1.5 MG/5ML syrup    Sig: Take 5 mLs by mouth every 6 (six) hours as needed for cough.    Dispense:  120 mL    Refill:  0    Order Specific Question:   Supervising Provider    Answer:   Eustaquio Maize [[id number]]   Mary-Margaret Hassell Done, FNP

## 2016-10-29 ENCOUNTER — Other Ambulatory Visit: Payer: Self-pay | Admitting: Nurse Practitioner

## 2016-10-29 ENCOUNTER — Telehealth: Payer: Self-pay | Admitting: Nurse Practitioner

## 2016-10-29 MED ORDER — FLUCONAZOLE 150 MG PO TABS: ORAL_TABLET | ORAL | 0 refills | Status: DC

## 2016-10-29 NOTE — Telephone Encounter (Signed)
Diflucan rx sent to pharmacy.

## 2016-10-29 NOTE — Telephone Encounter (Signed)
Please review and advise.

## 2016-10-30 NOTE — Telephone Encounter (Signed)
Letter done and given to patient at appt

## 2016-10-31 ENCOUNTER — Other Ambulatory Visit: Payer: Self-pay | Admitting: Nurse Practitioner

## 2016-10-31 DIAGNOSIS — J4522 Mild intermittent asthma with status asthmaticus: Secondary | ICD-10-CM

## 2016-11-01 ENCOUNTER — Other Ambulatory Visit: Payer: Self-pay | Admitting: Nurse Practitioner

## 2016-11-01 ENCOUNTER — Telehealth (INDEPENDENT_AMBULATORY_CARE_PROVIDER_SITE_OTHER): Payer: Self-pay | Admitting: Orthopedic Surgery

## 2016-11-01 NOTE — Telephone Encounter (Signed)
Yes OK

## 2016-11-01 NOTE — Telephone Encounter (Signed)
Jennifer Mullins has a 2 level ACD&F scheduled for 11/13/2016.  She would like to see her foot doctor next week and get a cortisone injection for her heel spurs. Wants to know if will it be ok to get injection this close to surgery?

## 2016-11-04 MED ORDER — CYPROHEPTADINE HCL 4 MG PO TABS: 4.0000 mg | ORAL_TABLET | Freq: Every day | ORAL | 1 refills | Status: DC

## 2016-11-04 MED ORDER — HYDROXYZINE HCL 50 MG PO TABS: 50.0000 mg | ORAL_TABLET | Freq: Three times a day (TID) | ORAL | 2 refills | Status: DC | PRN

## 2016-11-04 MED ORDER — ALBUTEROL SULFATE (2.5 MG/3ML) 0.083% IN NEBU: INHALATION_SOLUTION | RESPIRATORY_TRACT | 4 refills | Status: DC

## 2016-11-04 MED ORDER — ALBUTEROL SULFATE HFA 108 (90 BASE) MCG/ACT IN AERS: 1.0000 | INHALATION_SPRAY | Freq: Every day | RESPIRATORY_TRACT | 0 refills | Status: DC | PRN

## 2016-11-04 MED ORDER — OLOPATADINE HCL 0.6 % NA SOLN: 1.0000 | Freq: Every morning | NASAL | 3 refills | Status: DC

## 2016-11-05 NOTE — Telephone Encounter (Signed)
I spoke with Jennifer Mullins and advised ok to have cortisone injection per Dr. Lorin Mercy.

## 2016-11-06 NOTE — Pre-Procedure Instructions (Addendum)
How to Manage Your Diabetes Before and After Surgery  Why is it important to control my blood sugar before and after surgery? . Improving blood sugar levels before and after surgery helps healing and can limit problems. . A way of improving blood sugar control is eating a healthy diet by: o  Eating less sugar and carbohydrates o  Increasing activity/exercise o  Talking with your doctor about reaching your blood sugar goals . High blood sugars (greater than 180 mg/dL) can raise your risk of infections and slow your recovery, so you will need to focus on controlling your diabetes during the weeks before surgery. . Make sure that the doctor who takes care of your diabetes knows about your planned surgery including the date and location.  How do I manage my blood sugar before surgery? . Check your blood sugar at least 4 times a day, starting 2 days before surgery, to make sure that the level is not too high or low. o Check your blood sugar the morning of your surgery when you wake up and every 2 hours until you get to the Short Stay unit. o  . If your blood sugar is less than 70 mg/dL, you will need to treat for low blood sugar: o Do not take insulin. o  o Treat a low blood sugar (less than 70 mg/dL) with  cup of clear juice (cranberry or apple), 4 glucose tablets, OR glucose gel. o  o Recheck blood sugar in 15 minutes after treatment (to make sure it is greater than 70 mg/dL). If your blood sugar is not greater than 70 mg/dL on recheck, call [phone] for further instructions. . Report your blood sugar to the short stay nurse when you get to Short Stay.  . If you are admitted to the hospital after surgery: o Your blood sugar will be checked by the staff and you will probably be given insulin after surgery (instead of oral diabetes medicines) to make sure you have good blood sugar levels. o The goal for blood sugar control after surgery is  80-180 mg/dL.       WHAT DO I DO ABOUT MY DIABETES MEDICATION?  Marland Kitchen Do not take oral diabetes medicines (pills) the morning of surgery.   . If your CBG is greater than 220 mg/dL, you may take  of your sliding scale (correction) dose of insulin.  Other Instructions:          Patient Signature:  Date:   Nurse Signature:  Date:   Reviewed and Endorsed by [facility] Patient Education Committee, August 2015\                                            Jennifer Mullins  11/06/2016      [id number] - [place], [place] [address] [place] [address] Phone: [phone] Fax: [phone]  [id number] - [place], [place] [address] Phone: [phone] Fax: [phone]  [place], [place] [address] Phone: [phone] Fax: [place], [place]  [address] [place] [address] Phone: [phone] Fax: [phone]    Your procedure is scheduled on Wednesday, December 27th   Report to [facility] Admitting at 10:30 AM             (posted surgery time 12:25 pm - 3:07 pm)   Call this number if you have problems the MORNING of surgery:  [phone], otherwise you may call [phone],   Remember:  Do not eat food or drink liquids after midnight Tuesday.   Take these medicines the morning of surgery with A SIP OF WATER : Xanax, Pepcid or Protonix. Please use your inhalers the morning of surgery.              DO NOT TAKE any diabetes medication the MORNING of surgery.               4-5 days prior to surgery, STOP taking any Vitamins, herbal supplements, anti-inflammatories, blood thinners               Do not wear jewelry, make-up or nail polish.  Do not wear lotions, powders,perfumes, or deoderant.   Do  not shave 48 hours prior to surgery.    Do not bring valuables to the hospital.  [facility] is not responsible for any belongings or valuables.  Contacts, dentures or bridgework may not be worn into surgery.  Leave your suitcase in the car.  After surgery it may be brought to your room.  For patients admitted to the hospital, discharge time will be determined by your treatment team.  Please read over the following fact sheets that you were given. Pain Booklet, MRSA Information and Surgical Site Infection Prevention

## 2016-11-07 ENCOUNTER — Telehealth (INDEPENDENT_AMBULATORY_CARE_PROVIDER_SITE_OTHER): Payer: Self-pay | Admitting: Radiology

## 2016-11-07 ENCOUNTER — Encounter (HOSPITAL_COMMUNITY): Payer: Self-pay

## 2016-11-07 ENCOUNTER — Encounter (HOSPITAL_COMMUNITY)
Admission: RE | Admit: 2016-11-07 | Discharge: 2016-11-07 | Disposition: A | Discharge status: Home / Self Care | Payer: BLUE CROSS/BLUE SHIELD | Source: Ambulatory Visit | Attending: Orthopaedic Surgery | Admitting: Orthopaedic Surgery

## 2016-11-07 DIAGNOSIS — I10 Essential (primary) hypertension: Secondary | ICD-10-CM | POA: Insufficient documentation

## 2016-11-07 DIAGNOSIS — Z87891 Personal history of nicotine dependence: Secondary | ICD-10-CM | POA: Insufficient documentation

## 2016-11-07 DIAGNOSIS — K219 Gastro-esophageal reflux disease without esophagitis: Secondary | ICD-10-CM | POA: Insufficient documentation

## 2016-11-07 DIAGNOSIS — J449 Chronic obstructive pulmonary disease, unspecified: Secondary | ICD-10-CM | POA: Diagnosis not present

## 2016-11-07 DIAGNOSIS — E039 Hypothyroidism, unspecified: Secondary | ICD-10-CM | POA: Insufficient documentation

## 2016-11-07 DIAGNOSIS — Z01810 Encounter for preprocedural cardiovascular examination: Secondary | ICD-10-CM | POA: Diagnosis present

## 2016-11-07 DIAGNOSIS — Z7984 Long term (current) use of oral hypoglycemic drugs: Secondary | ICD-10-CM | POA: Diagnosis not present

## 2016-11-07 DIAGNOSIS — F419 Anxiety disorder, unspecified: Secondary | ICD-10-CM | POA: Insufficient documentation

## 2016-11-07 DIAGNOSIS — Z79899 Other long term (current) drug therapy: Secondary | ICD-10-CM | POA: Diagnosis not present

## 2016-11-07 DIAGNOSIS — E119 Type 2 diabetes mellitus without complications: Secondary | ICD-10-CM | POA: Insufficient documentation

## 2016-11-07 DIAGNOSIS — Z7982 Long term (current) use of aspirin: Secondary | ICD-10-CM | POA: Insufficient documentation

## 2016-11-07 DIAGNOSIS — Z01812 Encounter for preprocedural laboratory examination: Secondary | ICD-10-CM | POA: Diagnosis not present

## 2016-11-07 DIAGNOSIS — E785 Hyperlipidemia, unspecified: Secondary | ICD-10-CM | POA: Insufficient documentation

## 2016-11-07 HISTORY — DX: Polycystic ovarian syndrome: E28.2

## 2016-11-07 HISTORY — DX: Unspecified osteoarthritis, unspecified site: M19.90

## 2016-11-07 HISTORY — DX: Hypothyroidism, unspecified: E03.9

## 2016-11-07 HISTORY — DX: Essential (primary) hypertension: I10

## 2016-11-07 HISTORY — DX: Other specified postprocedural states: Z98.890

## 2016-11-07 HISTORY — DX: Chronic obstructive pulmonary disease, unspecified: J44.9

## 2016-11-07 HISTORY — DX: Other intervertebral disc degeneration, lumbar region without mention of lumbar back pain or lower extremity pain: M51.369

## 2016-11-07 HISTORY — DX: Anxiety disorder, unspecified: F41.9

## 2016-11-07 HISTORY — DX: Calcaneal spur, left foot: M77.32

## 2016-11-07 HISTORY — DX: Other specified postprocedural states: R11.2

## 2016-11-07 HISTORY — DX: Carpal tunnel syndrome, bilateral upper limbs: G56.03

## 2016-11-07 HISTORY — DX: Other intervertebral disc displacement, lumbar region: M51.26

## 2016-11-07 HISTORY — DX: Gastro-esophageal reflux disease without esophagitis: K21.9

## 2016-11-07 HISTORY — DX: Unspecified glaucoma: H40.9

## 2016-11-07 HISTORY — DX: Other intervertebral disc degeneration, lumbar region: M51.36

## 2016-11-07 HISTORY — DX: Calcaneal spur, right foot: M77.31

## 2016-11-07 LAB — COMPREHENSIVE METABOLIC PANEL
ALT: 19 U/L (ref 14–54)
AST: 23 U/L (ref 15–41)
Albumin: 3.9 g/dL (ref 3.5–5.0)
Alkaline Phosphatase: 55 U/L (ref 38–126)
Anion gap: 7 (ref 5–15)
BUN: 6 mg/dL (ref 6–20)
CO2: 24 mmol/L (ref 22–32)
Calcium: 9 mg/dL (ref 8.9–10.3)
Chloride: 108 mmol/L (ref 101–111)
Creatinine, Ser: 0.77 mg/dL (ref 0.44–1.00)
GFR calc Af Amer: >60 mL/min (ref >60)
GFR calc non Af Amer: >60 mL/min (ref >60)
Glucose, Bld: 89 mg/dL (ref 65–99)
Potassium: 3.5 mmol/L (ref 3.5–5.1)
Sodium: 139 mmol/L (ref 135–145)
Total Bilirubin: 0.2 mg/dL — ABNORMAL LOW (ref 0.3–1.2)
Total Protein: 6.3 g/dL — ABNORMAL LOW (ref 6.5–8.1)

## 2016-11-07 LAB — URINALYSIS, ROUTINE W REFLEX MICROSCOPIC
Bilirubin Urine: NEGATIVE
Glucose, UA: NEGATIVE mg/dL
Ketones, ur: NEGATIVE mg/dL
Leukocytes, UA: NEGATIVE
Nitrite: NEGATIVE
Protein, ur: NEGATIVE mg/dL
Specific Gravity, Urine: 1.012 (ref 1.005–1.030)
pH: 6 (ref 5.0–8.0)

## 2016-11-07 LAB — SURGICAL PCR SCREEN
MRSA, PCR: POSITIVE — AB
Staphylococcus aureus: POSITIVE — AB

## 2016-11-07 LAB — CBC
HCT: 31.6 % — ABNORMAL LOW (ref 36.0–46.0)
Hemoglobin: 10 g/dL — ABNORMAL LOW (ref 12.0–15.0)
MCH: 25.1 pg — ABNORMAL LOW (ref 26.0–34.0)
MCHC: 31.6 g/dL (ref 30.0–36.0)
MCV: 79.2 fL (ref 78.0–100.0)
Platelets: 249 10*3/uL (ref 150–400)
RBC: 3.99 MIL/uL (ref 3.87–5.11)
RDW: 19.5 % — ABNORMAL HIGH (ref 11.5–15.5)
WBC: 4.4 10*3/uL (ref 4.0–10.5)

## 2016-11-07 LAB — PROTIME-INR
INR: 1.02
Prothrombin Time: 13.4 seconds (ref 11.4–15.2)

## 2016-11-07 LAB — APTT: aPTT: 29 seconds (ref 24–36)

## 2016-11-07 LAB — GLUCOSE, CAPILLARY: Glucose-Capillary: 97 mg/dL (ref 65–99)

## 2016-11-07 NOTE — Telephone Encounter (Signed)
The chlorhexidine or CHG soap that she uses for shower pre-op. It may be a sponge she gets , not sure

## 2016-11-07 NOTE — Telephone Encounter (Signed)
Microbiology lab called states that patient had a positive MRSA Screen at her pre-op appointment.

## 2016-11-07 NOTE — Telephone Encounter (Signed)
Dr. Lorin Mercy states the patient's PCR is positive and she will need orders put in for the Vanc and Mupirocin.  If there is something else that you need me to do, please let me know. Thanks.

## 2016-11-07 NOTE — Progress Notes (Addendum)
Hx of palpitations.  Saw a cardio more than 10 yrs ago.  No problem since and hasn't been back. Blood sugars run 100 - 136 in the am's Hgb A1C was drawn in November -- 9.4 PCP is Dr. Rockne Coons  LOV 10/2017.   She has increased her diabetes medication from 1 pill to 2 a day.

## 2016-11-08 NOTE — Progress Notes (Addendum)
Anesthesia chart review: Patient is a 45 year old female scheduled for C5-6, C6-7 ACDF on 11/13/16 by Dr. Lorin Mercy.  History includes former smoker, asthma, postoperative nausea and vomiting, hypertension, hyperlipidemia, tachycardia, COPD, hypothyroidism, thyroid nodules, diabetes mellitus type 2, anxiety, GERD, arthritis, glaucoma, anemia, polycystic ovarian syndrome, uterine fibroids, nasal sinus surgery, exploratory laparotomy, breast surgery (not specified).  PCP is listed as Chevis Pretty, NP with [facility].  Meds include albuterol, Xanax, aspirin 81 mg (on hold), Lumigan ophthalmic, Symbicort, Pepcid, cyproheptadine, Lasix, hydroxyzine, Atrovent, lisinopril, metformin, Singulair, Protonix, pravastatin, Mucinex, Olopatadine ophthalmic.  BP (!) 150/74   Pulse 100   Temp 36.8 C   Resp 20   Ht 5' 1.5" (1.562 m)   Wt 142 lb 6.4 oz (64.6 kg)   LMP 11/03/2016   SpO2 100%   BMI 26.47 kg/m   EKG 11/07/16 NSR with sinus arrhythmia.  Echo 11/07/04 showed normal LVEF, mild MR.  CXR 04/02/16 showed no active cardiopulmonary disease.  Spirometry on 08/02/13 was normal.  Preoperative labs noted. Cr 0.77. H/H 10.0/31.6 which is consistent with previous labs from November and also anemic dating back to 10/31/14 (HGB 10.8).  PT/PTT WNL. Glucose 89, Bayer DCA HbA1c 5.5 on 10/04/16. Pregnancy test was not done at PAT, so will need to be done on the day of surgery. Will also get a hold clot order to [facility] since HGB 10 (surgeon and/or anesthesiologist can determine if they want a formal T&S/T&C). CBC results also routed to her PCP for future follow-up purposes. UA results also routed to Dr. Lorin Mercy.  [facility] Stay [facility]/Anesthesiology Phone [phone] 11/08/2016 10:18 AM

## 2016-11-13 ENCOUNTER — Encounter (HOSPITAL_COMMUNITY)
Admission: RE | Disposition: A | Discharge status: Home / Self Care | Payer: Self-pay | Source: Ambulatory Visit | Attending: Orthopaedic Surgery

## 2016-11-13 ENCOUNTER — Ambulatory Visit (HOSPITAL_COMMUNITY): Payer: BLUE CROSS/BLUE SHIELD | Admitting: Vascular Surgery

## 2016-11-13 ENCOUNTER — Encounter (HOSPITAL_COMMUNITY): Payer: Self-pay | Admitting: *Deleted

## 2016-11-13 ENCOUNTER — Observation Stay (HOSPITAL_COMMUNITY)
Admission: RE | Admit: 2016-11-13 | Discharge: 2016-11-14 | Disposition: A | Discharge status: Home / Self Care | Payer: BLUE CROSS/BLUE SHIELD | Source: Ambulatory Visit | Attending: Orthopaedic Surgery | Admitting: Orthopaedic Surgery

## 2016-11-13 ENCOUNTER — Ambulatory Visit (HOSPITAL_COMMUNITY): Payer: BLUE CROSS/BLUE SHIELD

## 2016-11-13 DIAGNOSIS — Z8249 Family history of ischemic heart disease and other diseases of the circulatory system: Secondary | ICD-10-CM | POA: Insufficient documentation

## 2016-11-13 DIAGNOSIS — H409 Unspecified glaucoma: Secondary | ICD-10-CM | POA: Diagnosis not present

## 2016-11-13 DIAGNOSIS — F411 Generalized anxiety disorder: Secondary | ICD-10-CM | POA: Insufficient documentation

## 2016-11-13 DIAGNOSIS — J449 Chronic obstructive pulmonary disease, unspecified: Secondary | ICD-10-CM | POA: Insufficient documentation

## 2016-11-13 DIAGNOSIS — Z87891 Personal history of nicotine dependence: Secondary | ICD-10-CM | POA: Diagnosis not present

## 2016-11-13 DIAGNOSIS — E042 Nontoxic multinodular goiter: Secondary | ICD-10-CM | POA: Diagnosis not present

## 2016-11-13 DIAGNOSIS — Z7984 Long term (current) use of oral hypoglycemic drugs: Secondary | ICD-10-CM | POA: Diagnosis not present

## 2016-11-13 DIAGNOSIS — E039 Hypothyroidism, unspecified: Secondary | ICD-10-CM | POA: Insufficient documentation

## 2016-11-13 DIAGNOSIS — E785 Hyperlipidemia, unspecified: Secondary | ICD-10-CM | POA: Insufficient documentation

## 2016-11-13 DIAGNOSIS — Z9889 Other specified postprocedural states: Secondary | ICD-10-CM | POA: Insufficient documentation

## 2016-11-13 DIAGNOSIS — E282 Polycystic ovarian syndrome: Secondary | ICD-10-CM | POA: Insufficient documentation

## 2016-11-13 DIAGNOSIS — Z79899 Other long term (current) drug therapy: Secondary | ICD-10-CM | POA: Insufficient documentation

## 2016-11-13 DIAGNOSIS — M4722 Other spondylosis with radiculopathy, cervical region: Secondary | ICD-10-CM | POA: Diagnosis present

## 2016-11-13 DIAGNOSIS — E119 Type 2 diabetes mellitus without complications: Secondary | ICD-10-CM | POA: Insufficient documentation

## 2016-11-13 DIAGNOSIS — I10 Essential (primary) hypertension: Secondary | ICD-10-CM | POA: Diagnosis not present

## 2016-11-13 DIAGNOSIS — K219 Gastro-esophageal reflux disease without esophagitis: Secondary | ICD-10-CM | POA: Diagnosis not present

## 2016-11-13 DIAGNOSIS — Z419 Encounter for procedure for purposes other than remedying health state, unspecified: Secondary | ICD-10-CM

## 2016-11-13 DIAGNOSIS — J329 Chronic sinusitis, unspecified: Secondary | ICD-10-CM | POA: Insufficient documentation

## 2016-11-13 DIAGNOSIS — M50222 Other cervical disc displacement at C5-C6 level: Secondary | ICD-10-CM | POA: Insufficient documentation

## 2016-11-13 DIAGNOSIS — M4802 Spinal stenosis, cervical region: Secondary | ICD-10-CM | POA: Insufficient documentation

## 2016-11-13 DIAGNOSIS — M47812 Spondylosis without myelopathy or radiculopathy, cervical region: Secondary | ICD-10-CM | POA: Diagnosis not present

## 2016-11-13 DIAGNOSIS — Z7951 Long term (current) use of inhaled steroids: Secondary | ICD-10-CM | POA: Insufficient documentation

## 2016-11-13 HISTORY — PX: ANTERIOR CERVICAL DECOMP/DISCECTOMY FUSION: SHX1161

## 2016-11-13 LAB — TYPE AND SCREEN
ABO/RH(D): O POS
Antibody Screen: NEGATIVE

## 2016-11-13 LAB — GLUCOSE, CAPILLARY
Glucose-Capillary: 84 mg/dL (ref 65–99)
Glucose-Capillary: 73 mg/dL (ref 65–99)
Glucose-Capillary: 81 mg/dL (ref 65–99)
Glucose-Capillary: 90 mg/dL (ref 65–99)

## 2016-11-13 LAB — HCG, SERUM, QUALITATIVE: Preg, Serum: NEGATIVE

## 2016-11-13 LAB — ABO/RH: ABO/RH(D): O POS

## 2016-11-13 SURGERY — ANTERIOR CERVICAL DECOMPRESSION/DISCECTOMY FUSION 2 LEVELS
Anesthesia: General | Site: Neck

## 2016-11-13 MED ORDER — BUPIVACAINE HCL (PF) 0.25 % IJ SOLN
INTRAMUSCULAR | Status: DC | PRN
  Administered 2016-11-13: 6 mL

## 2016-11-13 MED ORDER — KETOROLAC TROMETHAMINE 30 MG/ML IJ SOLN
30.0000 mg | Freq: Once | INTRAMUSCULAR | Status: AC
  Administered 2016-11-13: 30 mg via INTRAVENOUS

## 2016-11-13 MED ORDER — BUPIVACAINE HCL (PF) 0.25 % IJ SOLN
INTRAMUSCULAR | Status: AC
  Filled 2016-11-13: qty 30

## 2016-11-13 MED ORDER — MENTHOL 3 MG MT LOZG
1.0000 | LOZENGE | OROMUCOSAL | Status: DC | PRN
Start: 2016-11-13 — End: 2016-11-14
  Filled 2016-11-13: qty 9

## 2016-11-13 MED ORDER — PANTOPRAZOLE SODIUM 40 MG PO TBEC
40.0000 mg | DELAYED_RELEASE_TABLET | Freq: Every day | ORAL | Status: DC
  Administered 2016-11-14: 40 mg via ORAL
  Filled 2016-11-13: qty 1

## 2016-11-13 MED ORDER — SCOPOLAMINE 1 MG/3DAYS TD PT72
1.0000 | MEDICATED_PATCH | TRANSDERMAL | Status: DC
  Administered 2016-11-13: 1.5 mg via TRANSDERMAL
  Filled 2016-11-13: qty 1

## 2016-11-13 MED ORDER — EPINEPHRINE PF 1 MG/ML IJ SOLN
INTRAMUSCULAR | Status: AC
  Filled 2016-11-13: qty 1

## 2016-11-13 MED ORDER — SODIUM CHLORIDE 0.9 % IV SOLN: 250.0000 mL | INTRAVENOUS | Status: DC

## 2016-11-13 MED ORDER — FENTANYL CITRATE (PF) 100 MCG/2ML IJ SOLN
INTRAMUSCULAR | Status: AC
  Filled 2016-11-13: qty 2

## 2016-11-13 MED ORDER — ONDANSETRON HCL 4 MG/2ML IJ SOLN
4.0000 mg | INTRAMUSCULAR | Status: DC | PRN
  Administered 2016-11-14: 4 mg via INTRAVENOUS
  Filled 2016-11-13: qty 2

## 2016-11-13 MED ORDER — SUGAMMADEX SODIUM 200 MG/2ML IV SOLN
INTRAVENOUS | Status: DC | PRN
  Administered 2016-11-13: 100 mg via INTRAVENOUS

## 2016-11-13 MED ORDER — MIDAZOLAM HCL 2 MG/2ML IJ SOLN
INTRAMUSCULAR | Status: AC
  Filled 2016-11-13: qty 2

## 2016-11-13 MED ORDER — ALBUTEROL SULFATE HFA 108 (90 BASE) MCG/ACT IN AERS: 1.0000 | INHALATION_SPRAY | Freq: Four times a day (QID) | RESPIRATORY_TRACT | Status: DC | PRN

## 2016-11-13 MED ORDER — CARBOXYMETHYLCELLULOSE SODIUM 0.5 % OP SOLN: 1.0000 [drp] | Freq: Three times a day (TID) | OPHTHALMIC | Status: DC | PRN

## 2016-11-13 MED ORDER — HYDROMORPHONE HCL 1 MG/ML IJ SOLN
0.2500 mg | INTRAMUSCULAR | Status: DC | PRN
  Administered 2016-11-13 (×4): 0.5 mg via INTRAVENOUS

## 2016-11-13 MED ORDER — CHLORHEXIDINE GLUCONATE 4 % EX LIQD: 60.0000 mL | Freq: Once | CUTANEOUS | Status: DC

## 2016-11-13 MED ORDER — IPRATROPIUM BROMIDE 0.03 % NA SOLN
2.0000 | Freq: Two times a day (BID) | NASAL | Status: DC
  Filled 2016-11-13: qty 30

## 2016-11-13 MED ORDER — VANCOMYCIN HCL IN DEXTROSE 1-5 GM/200ML-% IV SOLN
INTRAVENOUS | Status: AC
  Filled 2016-11-13: qty 200

## 2016-11-13 MED ORDER — MIDAZOLAM HCL 5 MG/5ML IJ SOLN
INTRAMUSCULAR | Status: DC | PRN
  Administered 2016-11-13: 2 mg via INTRAVENOUS

## 2016-11-13 MED ORDER — SODIUM CHLORIDE 0.45 % IV SOLN
INTRAVENOUS | Status: DC
  Administered 2016-11-13: 18:00:00 via INTRAVENOUS

## 2016-11-13 MED ORDER — SUGAMMADEX SODIUM 200 MG/2ML IV SOLN
INTRAVENOUS | Status: AC
  Filled 2016-11-13: qty 2

## 2016-11-13 MED ORDER — BENZONATATE 100 MG PO CAPS
200.0000 mg | ORAL_CAPSULE | Freq: Three times a day (TID) | ORAL | Status: DC | PRN
Start: 2016-11-13 — End: 2016-11-14
  Administered 2016-11-13 – 2016-11-14 (×2): 200 mg via ORAL
  Filled 2016-11-13 (×2): qty 2

## 2016-11-13 MED ORDER — PROPOFOL 10 MG/ML IV BOLUS
INTRAVENOUS | Status: AC
  Filled 2016-11-13: qty 20

## 2016-11-13 MED ORDER — PHENYLEPHRINE HCL 10 MG/ML IJ SOLN
INTRAVENOUS | Status: DC | PRN
  Administered 2016-11-13: 10 ug/min via INTRAVENOUS

## 2016-11-13 MED ORDER — LISINOPRIL 10 MG PO TABS
10.0000 mg | ORAL_TABLET | Freq: Every day | ORAL | Status: DC
  Administered 2016-11-13 – 2016-11-14 (×2): 10 mg via ORAL
  Filled 2016-11-13 (×2): qty 1

## 2016-11-13 MED ORDER — FAMOTIDINE 20 MG PO TABS: 20.0000 mg | ORAL_TABLET | Freq: Every day | ORAL | Status: DC

## 2016-11-13 MED ORDER — KETOROLAC TROMETHAMINE 30 MG/ML IJ SOLN
INTRAMUSCULAR | Status: AC
  Filled 2016-11-13: qty 1

## 2016-11-13 MED ORDER — ASPIRIN EC 81 MG PO TBEC
81.0000 mg | DELAYED_RELEASE_TABLET | Freq: Every day | ORAL | Status: DC
  Administered 2016-11-13 – 2016-11-14 (×2): 81 mg via ORAL
  Filled 2016-11-13 (×2): qty 1

## 2016-11-13 MED ORDER — ONDANSETRON HCL 4 MG/2ML IJ SOLN
INTRAMUSCULAR | Status: DC | PRN
  Administered 2016-11-13: 4 mg via INTRAVENOUS

## 2016-11-13 MED ORDER — MOMETASONE FURO-FORMOTEROL FUM 200-5 MCG/ACT IN AERO
2.0000 | INHALATION_SPRAY | Freq: Two times a day (BID) | RESPIRATORY_TRACT | Status: DC
  Administered 2016-11-13 – 2016-11-14 (×2): 2 via RESPIRATORY_TRACT
  Filled 2016-11-13: qty 8.8

## 2016-11-13 MED ORDER — ALPRAZOLAM 0.5 MG PO TABS
0.5000 mg | ORAL_TABLET | Freq: Two times a day (BID) | ORAL | Status: DC | PRN
  Administered 2016-11-13 – 2016-11-14 (×2): 0.5 mg via ORAL
  Filled 2016-11-13 (×2): qty 1

## 2016-11-13 MED ORDER — CEFAZOLIN SODIUM-DEXTROSE 2-4 GM/100ML-% IV SOLN
2.0000 g | INTRAVENOUS | Status: DC
  Filled 2016-11-13: qty 100

## 2016-11-13 MED ORDER — PROPOFOL 10 MG/ML IV BOLUS
INTRAVENOUS | Status: DC | PRN
  Administered 2016-11-13: 170 mg via INTRAVENOUS

## 2016-11-13 MED ORDER — LATANOPROST 0.005 % OP SOLN
1.0000 [drp] | Freq: Every day | OPHTHALMIC | Status: DC
  Administered 2016-11-13: 1 [drp] via OPHTHALMIC
  Filled 2016-11-13: qty 2.5

## 2016-11-13 MED ORDER — METHOCARBAMOL 1000 MG/10ML IJ SOLN: 500.0000 mg | Freq: Four times a day (QID) | INTRAVENOUS | Status: DC | PRN

## 2016-11-13 MED ORDER — METFORMIN HCL 500 MG PO TABS
500.0000 mg | ORAL_TABLET | Freq: Two times a day (BID) | ORAL | Status: DC
Start: 2016-11-13 — End: 2016-11-14
  Administered 2016-11-14: 500 mg via ORAL
  Filled 2016-11-13 (×2): qty 1

## 2016-11-13 MED ORDER — HEMOSTATIC AGENTS (NO CHARGE) OPTIME
TOPICAL | Status: DC | PRN
  Administered 2016-11-13: 1 via TOPICAL

## 2016-11-13 MED ORDER — DEXTROSE 50 % IV SOLN
25.0000 mL | Freq: Once | INTRAVENOUS | Status: AC
  Administered 2016-11-13: 25 mL via INTRAVENOUS

## 2016-11-13 MED ORDER — 0.9 % SODIUM CHLORIDE (POUR BTL) OPTIME
TOPICAL | Status: DC | PRN
  Administered 2016-11-13: 1000 mL

## 2016-11-13 MED ORDER — PRAVASTATIN SODIUM 40 MG PO TABS
40.0000 mg | ORAL_TABLET | Freq: Every day | ORAL | Status: DC
  Administered 2016-11-13: 40 mg via ORAL
  Filled 2016-11-13: qty 1

## 2016-11-13 MED ORDER — ARTIFICIAL TEARS OP OINT
TOPICAL_OINTMENT | OPHTHALMIC | Status: DC | PRN
  Administered 2016-11-13: 1 via OPHTHALMIC

## 2016-11-13 MED ORDER — VANCOMYCIN HCL IN DEXTROSE 1-5 GM/200ML-% IV SOLN
1000.0000 mg | Freq: Once | INTRAVENOUS | Status: AC
  Administered 2016-11-13: 1000 mg via INTRAVENOUS

## 2016-11-13 MED ORDER — DEXTROSE 50 % IV SOLN
INTRAVENOUS | Status: AC
  Administered 2016-11-13: 25 mL via INTRAVENOUS
  Filled 2016-11-13: qty 50

## 2016-11-13 MED ORDER — CYPROHEPTADINE HCL 4 MG PO TABS
4.0000 mg | ORAL_TABLET | Freq: Every day | ORAL | Status: DC
  Filled 2016-11-13: qty 1

## 2016-11-13 MED ORDER — FENTANYL CITRATE (PF) 100 MCG/2ML IJ SOLN
INTRAMUSCULAR | Status: DC | PRN
  Administered 2016-11-13 (×2): 50 ug via INTRAVENOUS
  Administered 2016-11-13: 200 ug via INTRAVENOUS

## 2016-11-13 MED ORDER — SODIUM CHLORIDE 0.9% FLUSH: 3.0000 mL | INTRAVENOUS | Status: DC | PRN

## 2016-11-13 MED ORDER — ALUM & MAG HYDROXIDE-SIMETH 200-200-20 MG/5ML PO SUSP: 30.0000 mL | Freq: Four times a day (QID) | ORAL | Status: DC | PRN

## 2016-11-13 MED ORDER — SODIUM CHLORIDE 0.9% FLUSH
3.0000 mL | Freq: Two times a day (BID) | INTRAVENOUS | Status: DC
  Administered 2016-11-14: 3 mL via INTRAVENOUS

## 2016-11-13 MED ORDER — ALBUTEROL SULFATE (2.5 MG/3ML) 0.083% IN NEBU: 2.5000 mg | INHALATION_SOLUTION | Freq: Four times a day (QID) | RESPIRATORY_TRACT | Status: DC | PRN

## 2016-11-13 MED ORDER — PROMETHAZINE HCL 25 MG/ML IJ SOLN: 6.2500 mg | INTRAMUSCULAR | Status: DC | PRN

## 2016-11-13 MED ORDER — MONTELUKAST SODIUM 10 MG PO TABS
10.0000 mg | ORAL_TABLET | Freq: Every day | ORAL | Status: DC
  Administered 2016-11-13: 10 mg via ORAL
  Filled 2016-11-13: qty 1

## 2016-11-13 MED ORDER — OXYCODONE-ACETAMINOPHEN 5-325 MG PO TABS
1.0000 | ORAL_TABLET | ORAL | Status: DC | PRN
  Administered 2016-11-13 – 2016-11-14 (×6): 2 via ORAL
  Filled 2016-11-13 (×6): qty 2

## 2016-11-13 MED ORDER — ONDANSETRON HCL 4 MG/2ML IJ SOLN
INTRAMUSCULAR | Status: AC
  Filled 2016-11-13: qty 2

## 2016-11-13 MED ORDER — OLOPATADINE HCL 0.6 % NA SOLN: 1.0000 [drp] | Freq: Every day | NASAL | Status: DC | PRN

## 2016-11-13 MED ORDER — METHOCARBAMOL 500 MG PO TABS
500.0000 mg | ORAL_TABLET | Freq: Four times a day (QID) | ORAL | Status: DC | PRN
  Administered 2016-11-14 (×2): 500 mg via ORAL
  Filled 2016-11-13 (×3): qty 1

## 2016-11-13 MED ORDER — LIDOCAINE 2% (20 MG/ML) 5 ML SYRINGE
INTRAMUSCULAR | Status: AC
  Filled 2016-11-13: qty 10

## 2016-11-13 MED ORDER — IPRATROPIUM BROMIDE 0.03 % NA SOLN: 2.0000 | Freq: Two times a day (BID) | NASAL | Status: DC

## 2016-11-13 MED ORDER — PHENOL 1.4 % MT LIQD
1.0000 | OROMUCOSAL | Status: DC | PRN
  Administered 2016-11-13: 1 via OROMUCOSAL
  Filled 2016-11-13: qty 177

## 2016-11-13 MED ORDER — ARTIFICIAL TEARS OP OINT
TOPICAL_OINTMENT | OPHTHALMIC | Status: AC
  Filled 2016-11-13: qty 3.5

## 2016-11-13 MED ORDER — LIDOCAINE HCL (CARDIAC) 20 MG/ML IV SOLN
INTRAVENOUS | Status: DC | PRN
  Administered 2016-11-13: 100 mg via INTRAVENOUS

## 2016-11-13 MED ORDER — FLUTICASONE PROPIONATE 50 MCG/ACT NA SUSP
2.0000 | Freq: Every day | NASAL | Status: DC
  Administered 2016-11-13 – 2016-11-14 (×2): 2 via NASAL
  Filled 2016-11-13: qty 16

## 2016-11-13 MED ORDER — ROCURONIUM BROMIDE 10 MG/ML (PF) SYRINGE
PREFILLED_SYRINGE | INTRAVENOUS | Status: AC
  Filled 2016-11-13: qty 5

## 2016-11-13 MED ORDER — FUROSEMIDE 20 MG PO TABS
20.0000 mg | ORAL_TABLET | Freq: Every day | ORAL | Status: DC
  Administered 2016-11-14: 20 mg via ORAL
  Filled 2016-11-13 (×2): qty 1

## 2016-11-13 MED ORDER — ROCURONIUM BROMIDE 100 MG/10ML IV SOLN
INTRAVENOUS | Status: DC | PRN
  Administered 2016-11-13: 45 mg via INTRAVENOUS

## 2016-11-13 MED ORDER — HYDROMORPHONE HCL 2 MG/ML IJ SOLN
INTRAMUSCULAR | Status: AC
  Filled 2016-11-13: qty 1

## 2016-11-13 MED ORDER — LACTATED RINGERS IV SOLN
INTRAVENOUS | Status: DC
  Administered 2016-11-13 (×3): via INTRAVENOUS

## 2016-11-13 SURGICAL SUPPLY — 60 items
APL SKNCLS STERI-STRIP NONHPOA (GAUZE/BANDAGES/DRESSINGS) ×1
BENZOIN TINCTURE PRP APPL 2/3 (GAUZE/BANDAGES/DRESSINGS) ×2 IMPLANT
BIT DRILL SKYLINE 12MM (BIT) IMPLANT
BLADE SURG ROTATE 9660 (MISCELLANEOUS) IMPLANT
BONE CERV LORDOTIC 14.5X12X7 (Bone Implant) ×4 IMPLANT
BUR ROUND FLUTED 4 SOFT TCH (BURR) ×1 IMPLANT
COLLAR CERV LO CONTOUR FIRM DE (SOFTGOODS) IMPLANT
CORDS BIPOLAR (ELECTRODE) ×2 IMPLANT
COVER MAYO STAND STRL (DRAPES) ×2 IMPLANT
COVER SURGICAL LIGHT HANDLE (MISCELLANEOUS) ×1 IMPLANT
CRADLE DONUT ADULT HEAD (MISCELLANEOUS) ×2 IMPLANT
DRAPE C-ARM 42X72 X-RAY (DRAPES) ×3 IMPLANT
DRAPE MICROSCOPE LEICA (MISCELLANEOUS) ×3 IMPLANT
DRAPE PROXIMA HALF (DRAPES) ×2 IMPLANT
DRILL BIT SKYLINE 12MM (BIT) ×2
DURAPREP 6ML APPLICATOR 50/CS (WOUND CARE) ×2 IMPLANT
ELECT COATED BLADE 2.86 ST (ELECTRODE) ×2 IMPLANT
ELECT REM PT RETURN 9FT ADLT (ELECTROSURGICAL) ×2
ELECTRODE REM PT RTRN 9FT ADLT (ELECTROSURGICAL) ×1 IMPLANT
EVACUATOR 1/8 PVC DRAIN (DRAIN) ×1 IMPLANT
GAUZE SPONGE 4X4 12PLY STRL (GAUZE/BANDAGES/DRESSINGS) ×2 IMPLANT
GLOVE BIOGEL PI IND STRL 6.5 (GLOVE) IMPLANT
GLOVE BIOGEL PI IND STRL 7.5 (GLOVE) IMPLANT
GLOVE BIOGEL PI IND STRL 8 (GLOVE) ×2 IMPLANT
GLOVE BIOGEL PI INDICATOR 6.5 (GLOVE) ×1
GLOVE BIOGEL PI INDICATOR 7.5 (GLOVE) ×2
GLOVE BIOGEL PI INDICATOR 8 (GLOVE) ×1
GLOVE ECLIPSE 7.0 STRL STRAW (GLOVE) ×1 IMPLANT
GLOVE ORTHO TXT STRL SZ7.5 (GLOVE) ×3 IMPLANT
GLOVE SURG SS PI 6.5 STRL IVOR (GLOVE) ×3 IMPLANT
GOWN STRL REUS W/ TWL LRG LVL3 (GOWN DISPOSABLE) ×1 IMPLANT
GOWN STRL REUS W/ TWL XL LVL3 (GOWN DISPOSABLE) ×1 IMPLANT
GOWN STRL REUS W/TWL 2XL LVL3 (GOWN DISPOSABLE) ×1 IMPLANT
GOWN STRL REUS W/TWL LRG LVL3 (GOWN DISPOSABLE) ×4
GOWN STRL REUS W/TWL XL LVL3 (GOWN DISPOSABLE) ×2
GRAFT BNE SPCR VG2 14.5X12X7 (Bone Implant) IMPLANT
HEAD HALTER (SOFTGOODS) ×2 IMPLANT
HEMOSTAT SURGICEL 2X14 (HEMOSTASIS) IMPLANT
KIT BASIN OR (CUSTOM PROCEDURE TRAY) ×2 IMPLANT
KIT ROOM TURNOVER OR (KITS) ×2 IMPLANT
MANIFOLD NEPTUNE II (INSTRUMENTS) IMPLANT
NDL 25GX 5/8IN NON SAFETY (NEEDLE) ×1 IMPLANT
NEEDLE 25GX 5/8IN NON SAFETY (NEEDLE) ×2 IMPLANT
NS IRRIG 1000ML POUR BTL (IV SOLUTION) ×2 IMPLANT
PACK ORTHO CERVICAL (CUSTOM PROCEDURE TRAY) ×2 IMPLANT
PAD ARMBOARD 7.5X6 YLW CONV (MISCELLANEOUS) ×4 IMPLANT
PATTIES SURGICAL .5 X.5 (GAUZE/BANDAGES/DRESSINGS) ×1 IMPLANT
PIN TEMP SKYLINE THREADED (PIN) ×1 IMPLANT
PLATE TWO LEVEL SKYLINE 30MM (Plate) ×1 IMPLANT
RESTRAINT LIMB HOLDER UNIV (RESTRAINTS) ×1 IMPLANT
SCREW VARIABLE SELF TAP 12MM (Screw) ×7 IMPLANT
STRIP CLOSURE SKIN 1/2X4 (GAUZE/BANDAGES/DRESSINGS) ×2 IMPLANT
SURGIFLO W/THROMBIN 8M KIT (HEMOSTASIS) ×1 IMPLANT
SUT BONE WAX W31G (SUTURE) ×2 IMPLANT
SUT VIC AB 3-0 X1 27 (SUTURE) ×2 IMPLANT
SUT VICRYL 4-0 PS2 18IN ABS (SUTURE) ×4 IMPLANT
SYR 20ML ECCENTRIC (SYRINGE) ×1 IMPLANT
TOWEL OR 17X24 6PK STRL BLUE (TOWEL DISPOSABLE) ×2 IMPLANT
TOWEL OR 17X26 10 PK STRL BLUE (TOWEL DISPOSABLE) ×2 IMPLANT
TRAY FOLEY CATH 16FR SILVER (SET/KITS/TRAYS/PACK) IMPLANT

## 2016-11-13 NOTE — Interval H&P Note (Signed)
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 After consideration of risks, benefits and other options for treatment, the patient has consented to  Procedure(s) with comments: C5-6, C6-7 Anterior Cervical Discectomy and Fusion, Allograft, Plate (N/A) - Needs RNFA as a surgical intervention .  The patient's history has been reviewed, patient examined, no change in status, stable for surgery.  I have reviewed the patient's chart and labs.  Questions were answered to the patient's satisfaction.     Marybelle Killings

## 2016-11-13 NOTE — H&P (Signed)
Office Visit Note/   HISTORY AND PHYSICAL EXAM              Patient: Jennifer Mullins                                       Date of Birth: 1971-07-02                                                   MRN: [id number] Visit Date: 10/17/2016                                                                     Requested by: Chevis Pretty, [place], [place] [address] PCP: Chevis Pretty, FNP   Assessment & Plan: Visit Diagnoses:  1. Osteoarthritis of spine with radiculopathy, cervical region            Cervical spondylosis C5-6 ,C6-7  Plan: Patient had deferred surgery as previously recommended for the last 9 months. Her symptoms have progressed and increased pain was on a daily basis she has to grab and hold onto her neck frequently. She's taken medication without relief including prednisone packs which gave her relief for just a few days and then recurrence of symptoms. She's been through therapy taken anti-inflammatories and muscle relaxants. She is not working has applied for disability and has a hearing coming up. Patient states that pain is progressed the point where she now wants to proceed with surgery. MRI scan was reviewed with her and I gave her a copy of the report. Patient has spondylosis with disc protrusion at C5-6 C6-7 MRI scan 2016 first 2014 shows persistent disc protrusion with narrowing of the anterior epidural space with disc protrusions just touching the cord narrowing down to 10.4 mm at C5-6 and C6-7. Small bulge at C4-5 that does not cause compression. Facet degenerative changes. Follow-Up Instructions: No Follow-up on file.   Orders:  No orders of the defined types were placed in this encounter.  No orders of the defined types were placed in this encounter.     Procedures: No procedures performed   Clinical Data: No additional findings.   Subjective:    Chief Complaint  Patient presents with  . Neck - Pain     Patient presents with ongoing neck problems. She has a history of spondylosis, DDD, foraminal narrowing, disc protrusion at C5-6. Etc.  She would like to discuss two level cervical fusion.   Review of Systems  Constitutional: Negative for chills and diaphoresis.  HENT: Positive for congestion and sinus pressure. Negative for ear discharge, ear pain and nosebleeds.        Positive for history of thyroid goiter  Eyes: Negative for discharge and visual disturbance.  Respiratory: Negative for cough, choking and shortness of breath.   Cardiovascular: Negative for chest pain and palpitations.  Gastrointestinal: Negative for abdominal distention and abdominal pain.  Endocrine: Negative for cold intolerance and heat intolerance.  Genitourinary: Negative for flank pain and hematuria.  Musculoskeletal:  Positive for cervical spondylosis symptomatic C5-6 C6-7 disc bulge document by previous MRI scan 2017.  Skin: Negative for rash and wound.  Neurological: Negative for seizures and speech difficulty.  Hematological: Negative for adenopathy. Does not bruise/bleed easily.  Psychiatric/Behavioral: Negative for agitation and suicidal ideas.       Positive for history of anxiety     Objective: Vital Signs: BP 127/74   Pulse 84   Ht 5\' 2"  (1.575 m)   Wt 142 lb (64.4 kg)   BMI 25.97 kg/m   Physical Exam  Constitutional: She is oriented to person, place, and time. She appears well-developed.  HENT:  Head: Normocephalic.  Right Ear: External ear normal.  Left Ear: External ear normal.  Eyes: Pupils are equal, round, and reactive to light.  Neck: No tracheal deviation present. No thyromegaly present.  Cardiovascular: Normal rate and regular rhythm.   Pulmonary/Chest: Effort normal.  Abdominal: Soft. There is no tenderness. There is no rebound.  Musculoskeletal:  Comparison is present with cervical compression bilateral brachioplexus tenderness. Negative Spurling. Upper  extremity reflexes are 2+ and symmetrical. With brachioplexus pressure she has numbness and tingling that radiates to the radial side of both hands and into the long finger.  Neurological: She is alert and oriented to person, place, and time.  Skin: Skin is warm and dry.  Psychiatric: She has a normal mood and affect. Her behavior is normal.    Ortho Exam native shoulder impingement right and left. Biceps triceps are strong no atrophy noted. No wrist flexion or extension weakness. Decreased sensation radial side of her hand over the C6 distribution. Interossei are strong for fundi several my EPL are normal. Upper and lower extremity pulses are 2+ and symmetrical no rash over exposed skin no pitting edema.  Specialty Comments:  No specialty comments available.  Imaging: No results found.   PMFS History:     Patient Active Problem List   Diagnosis Date Noted  . Allergic rhinoconjunctivitis 07/28/2015  . Chronic headache 07/28/2015  . PCOS (polycystic ovarian syndrome) 06/20/2015  . 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 Hyperlipidemia LDL goal <100 06/20/2015  . GAD (generalized anxiety disorder) 05/18/2014  . GERD (gastroesophageal reflux disease) 05/18/2014  . Body fluid retention 07/07/2013  . Chronic sinusitis 07/02/2013  . Extrinsic asthma 05/20/2013  . HTN (hypertension) 05/20/2013  . Diabetes ([place]) 05/20/2013       Past Medical History:  Diagnosis Date  . Asthma   . Diabetes mellitus without complication ([place])   . GAD (generalized anxiety disorder)   . Hyperlipidemia   . Tachycardia          Family History  Problem Relation Age of Onset  . Hypertension Mother   . Hypertension Father          Past Surgical History:  Procedure Laterality Date  . BREAST SURGERY    . CARPAL TUNNEL RELEASE    . EXPLORATORY LAPAROTOMY    . NASAL SINUS SURGERY  07/22/13   Dr. Redmond Pulling in [place] History  . Unemployed           Social History Main Topics  . Smoking status: Former Smoker    Packs/day: 0.25    Years: 5.00    Types: Cigarettes    Quit date: 11/18/1989  . Smokeless tobacco: Never Used  . Alcohol use Yes     Comment: occasional  . Drug use: No  . Sexual activity: Not on  file          Communications      [facility] Provider CC Chart Rep sent to [place], FNP Sent 10/18/2016  Orders Placed    None  Medication Changes     None    Medication List  Visit Diagnoses      Osteoarthritis of spine with radiculopathy, cervical region    Problem List  Level of Service   Level of Service  PR OFFICE OUTPATIENT VISIT 15 MINUTES [99213]  All Charges for This Encounter   Code Description Service Date Service Provider Modifiers Qty  [place] OUTPATIENT VISIT 15 MINUTES 10/17/2016 Marybelle Killings, MD  1

## 2016-11-13 NOTE — Op Note (Signed)
Preop diagnosis: Cervical spondylosis, C5-6 C6-7  Postop diagnosis: Same  Procedure: C5-6, C6-7 anterior cervical discectomy, allograft and plate  Surgeon: Rodell Perna M.D.  Assistant: April Fulp RN FA  Anesthesia: Gen. endotracheal and Marcaine skin local 6 mL  Drains: One Hemovac neck  Procedure: After the induction of anesthesia orotracheal intubation Of Ancef prophylaxis arm tucked at the side wrist restraints for traction as needed for visualization while C-arm is being used during the case had halter traction was applied 4 x 4's protect the ear DuraPrep applied to the neck the air squared with towels Betadine Steri-Drape sterile Melisande at the head and thyroid sheets and drapes. Timeout procedure was completed  Incision was made starting at the midline extending to the left. Platysma was divided in line with the fibers. Blunt dissection down the longus Coley muscles was performed. Prominent spur at C5-6 C6-7 was noted needle was placed and C5-6 short 25-gauge needle confirmed with lateral x-ray. Spurs removed discectomy was performed using Claritin curettes straight pituitaries. Self-retaining retractors were placed. Operative microscope was draped brought in posterior longitudinal ligament was dissected away , trial sizing shows 7 mm graft gated a nice fit. His countersunk 2 mm. Identical procedure was performed at C6-7. At this level there is about 1 mm gap posteriorly from overhanging spurs over starting the shingle. Spurs removed  decompressed with the patient had central disc protrusion that was effacing the anterior CSF space and just touching the cord. Uncovertebral joints were stripped  overhanging spurs were removed and the cord was decompressed.. Trial sizing showed a 7 mm graft also fit nicely at this level it was countersunk after being marked anteriorly. Plate was selected skyline Depuy  plate 30 mm. 12 mm screws 6 are placed after C-arm was used to check position the 2 graspers  were V G-tube graft 7 mm each. After irrigation Hemovac was placed separate stab incision using the trocar and out technique on the left side of the incision in line with the skin incision. Platysma closed with 3-0 Vicryl for Vicryl subcuticular closure. Tincture benzoin Steri-Strips 4 x 4's tape and soft cervical collar.

## 2016-11-13 NOTE — Anesthesia Preprocedure Evaluation (Addendum)
Anesthesia Evaluation  Patient identified by MRN, date of birth, ID band Patient awake    Reviewed: Allergy & Precautions, NPO status   History of Anesthesia Complications (+) PONV and history of anesthetic complications  Airway Mallampati: I  TM Distance: >3 FB Neck ROM: Full    Dental  (+) Teeth Intact   Pulmonary asthma , COPD, former smoker,    breath sounds clear to auscultation       Cardiovascular hypertension,  Rhythm:Regular Rate:Normal     Neuro/Psych    GI/Hepatic GERD  ,  Endo/Other  diabetesHypothyroidism   Renal/GU      Musculoskeletal  (+) Arthritis ,   Abdominal   Peds  Hematology   Anesthesia Other Findings   Reproductive/Obstetrics                            Anesthesia Physical Anesthesia Plan  ASA: III  Anesthesia Plan: General   Post-op Pain Management:    Induction: Intravenous  Airway Management Planned: Oral ETT  Additional Equipment:   Intra-op Plan:   Post-operative Plan: Extubation in OR  Informed Consent: I have reviewed the patients History and Physical, chart, labs and discussed the procedure including the risks, benefits and alternatives for the proposed anesthesia with the patient or authorized representative who has indicated his/her understanding and acceptance.   Dental advisory given  Plan Discussed with: CRNA  Anesthesia Plan Comments:         Anesthesia Quick Evaluation

## 2016-11-13 NOTE — Transfer of Care (Signed)
Immediate Anesthesia Transfer of Care Note  Patient: Jennifer Mullins  Procedure(s) Performed: Procedure(s): Cervical five-six, Cervical six-seven Anterior Cervical Discectomy and Fusion, Allograft, Plate (N/A)  Patient Location: PACU  Anesthesia Type:General  Level of Consciousness: awake, alert  and oriented  Airway & Oxygen Therapy: Patient Spontanous Breathing and Patient connected to nasal cannula oxygen  Post-op Assessment: Report given to RN, Post -op Vital signs reviewed and stable and Patient moving all extremities  Post vital signs: Reviewed and stable  Last Vitals:  Vitals:   11/13/16 0957  BP: (!) 156/87  Pulse: 92  Resp: 20  Temp: 36.8 C    Last Pain:  Vitals:   11/13/16 1007  TempSrc:   PainSc: 5          Complications: No apparent anesthesia complications

## 2016-11-13 NOTE — Brief Op Note (Signed)
11/13/2016  4:13 PM  PATIENT:  Jennifer Mullins  45 y.o. female  PRE-OPERATIVE DIAGNOSIS:  Cervical Spondylosis Cervical five-six, Cervical six-seven  POST-OPERATIVE DIAGNOSIS:  Cervical Spondylosis Cervical five-six, Cervical six-seven  PROCEDURE:  Procedure(s): Cervical five-six, Cervical six-seven Anterior Cervical Discectomy and Fusion, Allograft, Plate (N/A)  SURGEON:  Surgeon(s) and Role:    * Marybelle Killings, MD - Primary  PHYSICIAN ASSISTANT:   ASSISTANTS: April Fulp RNFA  ANESTHESIA:   local and general  EBL:  Total I/O In: 1000 [I.V.:1000] Out: 20 [Blood:20]  BLOOD ADMINISTERED:none  DRAINS: HV neck   LOCAL MEDICATIONS USED:  MARCAINE     SPECIMEN:  No Specimen  DISPOSITION OF SPECIMEN:  N/A  COUNTS:  YES  TOURNIQUET:  * No tourniquets in log *  DICTATION: .Other Dictation: Dictation Number [id number]  PLAN OF CARE: Admit for overnight observation  PATIENT DISPOSITION:  PACU - hemodynamically stable.   Delay start of Pharmacological VTE agent (>24hrs) due to surgical blood loss or risk of bleeding: not applicable

## 2016-11-13 NOTE — Telephone Encounter (Signed)
This message was sent to my in basket after I left for vacation.  I did not enter orders, patient's surgery was today at 12:30pm.

## 2016-11-13 NOTE — Interval H&P Note (Signed)
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 After consideration of risks, benefits and other options for treatment, the patient has consented to  Procedure(s) with comments: C5-6, C6-7 Anterior Cervical Discectomy and Fusion, Allograft, Plate (N/A) - Needs RNFA as a surgical intervention .  The patient's history has been reviewed, patient examined, no change in status, stable for surgery.  I have reviewed the patient's chart and labs.  Questions were answered to the patient's satisfaction.     Marybelle Killings

## 2016-11-13 NOTE — Anesthesia Procedure Notes (Signed)
Procedure Name: Intubation Date/Time: 11/13/2016 1:34 PM Performed by: Izora Gala Pre-anesthesia Checklist: Patient identified, Emergency Drugs available, Suction available and Patient being monitored Patient Re-evaluated:Patient Re-evaluated prior to inductionOxygen Delivery Method: Circle system utilized Preoxygenation: Pre-oxygenation with 100% oxygen Intubation Type: IV induction Ventilation: Mask ventilation without difficulty Laryngoscope Size: Miller and 3 Grade View: Grade I Tube type: Oral Tube size: 7.5 mm Number of attempts: 1 Airway Equipment and Method: Stylet,  LTA kit utilized and Bite block Placement Confirmation: ETT inserted through vocal cords under direct vision,  positive ETCO2 and breath sounds checked- equal and bilateral Secured at: 21 cm Tube secured with: Tape Dental Injury: Teeth and Oropharynx as per pre-operative assessment

## 2016-11-13 NOTE — Anesthesia Postprocedure Evaluation (Signed)
Anesthesia Post Note  Patient: Jennifer Mullins  Procedure(s) Performed: Procedure(s) (LRB): Cervical five-six, Cervical six-seven Anterior Cervical Discectomy and Fusion, Allograft, Plate (N/A)  Patient location during evaluation: PACU Anesthesia Type: General Level of consciousness: awake and sedated Pain management: pain level controlled Vital Signs Assessment: post-procedure vital signs reviewed and stable Respiratory status: spontaneous breathing, nonlabored ventilation, respiratory function stable and patient connected to nasal cannula oxygen Cardiovascular status: blood pressure returned to baseline and stable Postop Assessment: no signs of nausea or vomiting Anesthetic complications: no       Last Vitals:  Vitals:   11/13/16 1700 11/13/16 1715  BP: 132/69 112/79  Pulse:    Resp: 16 16  Temp:      Last Pain:  Vitals:   11/13/16 1715  TempSrc:   PainSc: Asleep                 [place],JAMES TERRILL

## 2016-11-14 ENCOUNTER — Encounter (HOSPITAL_COMMUNITY): Payer: Self-pay | Admitting: Orthopaedic Surgery

## 2016-11-14 DIAGNOSIS — M47812 Spondylosis without myelopathy or radiculopathy, cervical region: Secondary | ICD-10-CM | POA: Diagnosis not present

## 2016-11-14 LAB — CBC
HCT: 27.7 % — ABNORMAL LOW (ref 36.0–46.0)
Hemoglobin: 8.6 g/dL — ABNORMAL LOW (ref 12.0–15.0)
MCH: 23.9 pg — ABNORMAL LOW (ref 26.0–34.0)
MCHC: 31 g/dL (ref 30.0–36.0)
MCV: 76.9 fL — ABNORMAL LOW (ref 78.0–100.0)
Platelets: 197 10*3/uL (ref 150–400)
RBC: 3.6 MIL/uL — ABNORMAL LOW (ref 3.87–5.11)
RDW: 19 % — ABNORMAL HIGH (ref 11.5–15.5)
WBC: 6.5 10*3/uL (ref 4.0–10.5)

## 2016-11-14 LAB — BASIC METABOLIC PANEL
Anion gap: 3 — ABNORMAL LOW (ref 5–15)
BUN: 5 mg/dL — ABNORMAL LOW (ref 6–20)
CO2: 27 mmol/L (ref 22–32)
Calcium: 8.2 mg/dL — ABNORMAL LOW (ref 8.9–10.3)
Chloride: 104 mmol/L (ref 101–111)
Creatinine, Ser: 0.59 mg/dL (ref 0.44–1.00)
GFR calc Af Amer: >60 mL/min (ref >60)
GFR calc non Af Amer: >60 mL/min (ref >60)
Glucose, Bld: 96 mg/dL (ref 65–99)
Potassium: 3.6 mmol/L (ref 3.5–5.1)
Sodium: 134 mmol/L — ABNORMAL LOW (ref 135–145)

## 2016-11-14 LAB — GLUCOSE, CAPILLARY: Glucose-Capillary: 90 mg/dL (ref 65–99)

## 2016-11-14 MED ORDER — OXYCODONE-ACETAMINOPHEN 5-325 MG PO TABS: 1.0000 | ORAL_TABLET | Freq: Four times a day (QID) | ORAL | 0 refills | Status: DC | PRN

## 2016-11-14 NOTE — Progress Notes (Signed)
Orthopedic Tech Progress Note Patient Details:  Jennifer Mullins 08/17/1971 [id number]  Patient ID: Jennifer Mullins, female   DOB: 07/07/1971, 45 y.o.   MRN: [id number] Pt already had on soft collar  [name], [name] 11/14/2016, 6:38 AM

## 2016-11-14 NOTE — Progress Notes (Signed)
   Subjective: 1 Day Post-Op Procedure(s) (LRB): Cervical five-six, Cervical six-seven Anterior Cervical Discectomy and Fusion, Allograft, Plate (N/A) Patient reports pain as mild.  Anxious, has not had Xanax yet this AM. HV removed. Good relief of pre-op arm pain.   Objective: Vital signs in last 24 hours: Temp:  [97.3 F (36.3 C)-99.1 F (37.3 C)] 98.7 F (37.1 C) (12/28 0300) Pulse Rate:  [66-92] 74 (12/28 0300) Resp:  [16-20] 16 (12/28 0300) BP: (112-156)/(61-94) 129/61 (12/28 0300) SpO2:  [92 %-100 %] 100 % (12/28 0300) Weight:  [142 lb (64.4 kg)] 142 lb (64.4 kg) (12/27 0957)  Intake/Output from previous day: 12/27 0701 - 12/28 0700 In: 1000 [I.V.:1000] Out: 20 [Blood:20] Intake/Output this shift: No intake/output data recorded.   Recent Labs  11/14/16 0540  HGB 8.6*    Recent Labs  11/14/16 0540  WBC 6.5  RBC 3.60*  HCT 27.7*  PLT 197    Recent Labs  11/14/16 0540  NA 134*  K 3.6  CL 104  CO2 27  BUN 5*  CREATININE 0.59  GLUCOSE 96  CALCIUM 8.2*   No results for input(s): LABPT, INR in the last 72 hours.  Neurologically intact Dg Cervical Spine 2-3 Views  Result Date: 11/13/2016 CLINICAL DATA:  Anterior fusion at C5-6 and C6-7 EXAM: CERVICAL SPINE - 2-3 VIEW COMPARISON:  MR C-spine of 10/09/2015 FINDINGS: Two C-arm spot films show anterior fusion at C5-6 and [id number] with no complicating features noted. Interbody fusion plugs at C5-6 and C6-7 appear to be in good position with normal alignment maintained. IMPRESSION: Anterior fusion at C5-6 and C6-7.  No complicating features. Electronically Signed   By: Ivar Drape M.D.   On: 11/13/2016 15:51   Dg C-arm 1-60 Min  Result Date: 11/13/2016 CLINICAL DATA:  Anterior fusion at C5-6 and C6-7 EXAM: DG C-ARM 61-120 MIN COMPARISON:  MR C-spine of 10/09/2015 FINDINGS: C-arm fluoroscopy was provided during anterior fusion. 9 seconds of fluoroscopy time was recorded. IMPRESSION: C-arm fluoroscopy provided.  Electronically Signed   By: Ivar Drape M.D.   On: 11/13/2016 15:51    Assessment/Plan: 1 Day Post-Op Procedure(s) (LRB): Cervical five-six, Cervical six-seven Anterior Cervical Discectomy and Fusion, Allograft, Plate (N/A) Plan:   Dressing change, discharge home . rx percocet. Office one week.   Jennifer Mullins 11/14/2016, 7:51 AM

## 2016-11-14 NOTE — Progress Notes (Signed)
Pt has home shower collar. Clarification with Dr. Lorin Mercy. Per his order, will change gauze dressing to mepilex prior to discharge. Awaiting pt to attempt soft diet prior to d/c home. MD aware pt c/o "intense pain". No other orders at this time. Will continue to monitor.

## 2016-11-14 NOTE — Progress Notes (Signed)
Rept to Dr. Lorin Mercy. Pt feels her pain is too "intense" to go home and is concerned that her original "scheduled" care giver will not be able to assist her at home. Pt has been able to consume some soup, [place] icee and applesauce without difficulty.  Pt is requesting SNF placement. No new orders at this time. MD states that he will call into pt's room and talk to her about her plan of care. Will continue to monitor.

## 2016-11-14 NOTE — Progress Notes (Signed)
Pt discharged to home without incident per MD order accompanied by significant other via wheelchair without incident. Prior to discharge, gauze dressing removed from pt's anterior neck. Incision was well approximated with no s/sx of infection or other adverse symptoms. Minimal bldy drainage noted on old dressing. Pt given mepilex dressings for home use. Pt able to tolerate mashed potatoes without too much difficulty prior to discharge. Pt swallows pain pills without difficulty. Pt well controlled and no change in pt from AM assessment. Prior to d/c, discharge teachings done both written and verbal. All questions answered. Pt verb understanding of all teachings and agrees to comply.

## 2016-11-14 NOTE — Progress Notes (Signed)
Orthopedic Tech Progress Note Patient Details:  Jennifer Mullins 1971-01-19 [id number]  Ortho Devices Type of Ortho Device: Soft collar Ortho Device/Splint Location: neck Ortho Device/Splint Interventions: [name], [name] 11/14/2016, 10:45 AM As ordered by Dr. Lorin Mercy

## 2016-11-15 ENCOUNTER — Telehealth (INDEPENDENT_AMBULATORY_CARE_PROVIDER_SITE_OTHER): Payer: Self-pay | Admitting: Orthopaedic Surgery

## 2016-11-15 ENCOUNTER — Telehealth: Payer: Self-pay | Admitting: Nurse Practitioner

## 2016-11-15 MED ORDER — OXYCODONE-ACETAMINOPHEN 5-325 MG PO TABS: 1.0000 | ORAL_TABLET | Freq: Four times a day (QID) | ORAL | 0 refills | Status: DC | PRN

## 2016-11-15 NOTE — Telephone Encounter (Signed)
I called patient. She is in severe pain. She is having to take Oxycodone 2 every 4 hours. She cannot make it 6 hours.  She states that Dr Lorin Mercy only prescribed her #40 and she is going to run out since the office is closed on Monday.  She also says they were giving her the pain med 2 every 4 hours in the hospital. Can she pick up a refill so that she will have it to get filled if this pain does not get better, so that she will not run out over the holiday?

## 2016-11-15 NOTE — Telephone Encounter (Signed)
Pt requesting call back regarding status of her med refill at [phone]  she stated she lives in [place] and wants to get it today... Thank you

## 2016-11-15 NOTE — Telephone Encounter (Signed)
Script at front for pick up

## 2016-11-15 NOTE — Telephone Encounter (Signed)
Patient called wanting Dr Lorin Mercy to know that she can not take the percocet every 6 hours. Patient said she need to take the medicine every 4 hours because she can not take the pain. Dr Darliss Cheney number to contact her is  [phone]

## 2016-11-19 ENCOUNTER — Inpatient Hospital Stay (HOSPITAL_COMMUNITY)
Admission: EM | Admit: 2016-11-19 | Discharge: 2016-11-22 | DRG: 176 | Disposition: A | Discharge status: Home Health | Payer: BLUE CROSS/BLUE SHIELD | Attending: Internal Medicine | Admitting: Internal Medicine

## 2016-11-19 ENCOUNTER — Emergency Department (HOSPITAL_COMMUNITY): Payer: BLUE CROSS/BLUE SHIELD

## 2016-11-19 ENCOUNTER — Encounter (HOSPITAL_COMMUNITY): Payer: Self-pay | Admitting: Emergency Medicine

## 2016-11-19 DIAGNOSIS — M47812 Spondylosis without myelopathy or radiculopathy, cervical region: Secondary | ICD-10-CM | POA: Diagnosis present

## 2016-11-19 DIAGNOSIS — Z79899 Other long term (current) drug therapy: Secondary | ICD-10-CM

## 2016-11-19 DIAGNOSIS — I2609 Other pulmonary embolism with acute cor pulmonale: Secondary | ICD-10-CM

## 2016-11-19 DIAGNOSIS — R7303 Prediabetes: Secondary | ICD-10-CM

## 2016-11-19 DIAGNOSIS — Z7951 Long term (current) use of inhaled steroids: Secondary | ICD-10-CM | POA: Diagnosis not present

## 2016-11-19 DIAGNOSIS — Z7984 Long term (current) use of oral hypoglycemic drugs: Secondary | ICD-10-CM

## 2016-11-19 DIAGNOSIS — Z8249 Family history of ischemic heart disease and other diseases of the circulatory system: Secondary | ICD-10-CM

## 2016-11-19 DIAGNOSIS — F411 Generalized anxiety disorder: Secondary | ICD-10-CM | POA: Diagnosis not present

## 2016-11-19 DIAGNOSIS — Z888 Allergy status to other drugs, medicaments and biological substances status: Secondary | ICD-10-CM | POA: Diagnosis not present

## 2016-11-19 DIAGNOSIS — R0602 Shortness of breath: Secondary | ICD-10-CM | POA: Diagnosis present

## 2016-11-19 DIAGNOSIS — I2699 Other pulmonary embolism without acute cor pulmonale: Secondary | ICD-10-CM

## 2016-11-19 DIAGNOSIS — Z7982 Long term (current) use of aspirin: Secondary | ICD-10-CM

## 2016-11-19 DIAGNOSIS — E039 Hypothyroidism, unspecified: Secondary | ICD-10-CM | POA: Diagnosis present

## 2016-11-19 DIAGNOSIS — Z86711 Personal history of pulmonary embolism: Secondary | ICD-10-CM

## 2016-11-19 DIAGNOSIS — H409 Unspecified glaucoma: Secondary | ICD-10-CM | POA: Diagnosis present

## 2016-11-19 DIAGNOSIS — I10 Essential (primary) hypertension: Secondary | ICD-10-CM | POA: Diagnosis present

## 2016-11-19 DIAGNOSIS — M7989 Other specified soft tissue disorders: Secondary | ICD-10-CM | POA: Diagnosis not present

## 2016-11-19 DIAGNOSIS — E785 Hyperlipidemia, unspecified: Secondary | ICD-10-CM | POA: Diagnosis present

## 2016-11-19 DIAGNOSIS — K219 Gastro-esophageal reflux disease without esophagitis: Secondary | ICD-10-CM

## 2016-11-19 DIAGNOSIS — Z981 Arthrodesis status: Secondary | ICD-10-CM | POA: Diagnosis not present

## 2016-11-19 DIAGNOSIS — Z885 Allergy status to narcotic agent status: Secondary | ICD-10-CM

## 2016-11-19 DIAGNOSIS — J449 Chronic obstructive pulmonary disease, unspecified: Secondary | ICD-10-CM | POA: Diagnosis present

## 2016-11-19 DIAGNOSIS — E119 Type 2 diabetes mellitus without complications: Secondary | ICD-10-CM | POA: Diagnosis present

## 2016-11-19 DIAGNOSIS — Z87891 Personal history of nicotine dependence: Secondary | ICD-10-CM | POA: Diagnosis not present

## 2016-11-19 DIAGNOSIS — E282 Polycystic ovarian syndrome: Secondary | ICD-10-CM | POA: Diagnosis present

## 2016-11-19 DIAGNOSIS — I82619 Acute embolism and thrombosis of superficial veins of unspecified upper extremity: Secondary | ICD-10-CM | POA: Diagnosis present

## 2016-11-19 DIAGNOSIS — M79609 Pain in unspecified limb: Secondary | ICD-10-CM | POA: Diagnosis not present

## 2016-11-19 HISTORY — DX: Other pulmonary embolism without acute cor pulmonale: I26.99

## 2016-11-19 LAB — GLUCOSE, CAPILLARY: Glucose-Capillary: 96 mg/dL (ref 65–99)

## 2016-11-19 LAB — COMPREHENSIVE METABOLIC PANEL
ALT: 12 U/L — ABNORMAL LOW (ref 14–54)
AST: 12 U/L — ABNORMAL LOW (ref 15–41)
Albumin: 3.8 g/dL (ref 3.5–5.0)
Alkaline Phosphatase: 60 U/L (ref 38–126)
Anion gap: 7 (ref 5–15)
BUN: 8 mg/dL (ref 6–20)
CO2: 27 mmol/L (ref 22–32)
Calcium: 9.4 mg/dL (ref 8.9–10.3)
Chloride: 100 mmol/L — ABNORMAL LOW (ref 101–111)
Creatinine, Ser: 0.58 mg/dL (ref 0.44–1.00)
GFR calc Af Amer: >60 mL/min (ref >60)
GFR calc non Af Amer: >60 mL/min (ref >60)
Glucose, Bld: 107 mg/dL — ABNORMAL HIGH (ref 65–99)
Potassium: 3.8 mmol/L (ref 3.5–5.1)
Sodium: 134 mmol/L — ABNORMAL LOW (ref 135–145)
Total Bilirubin: 0.3 mg/dL (ref 0.3–1.2)
Total Protein: 7.9 g/dL (ref 6.5–8.1)

## 2016-11-19 LAB — CBC WITH DIFFERENTIAL/PLATELET
Basophils Absolute: 0 10*3/uL (ref 0.0–0.1)
Basophils Relative: 0 %
Eosinophils Absolute: 0 10*3/uL (ref 0.0–0.7)
Eosinophils Relative: 1 %
HCT: 30.1 % — ABNORMAL LOW (ref 36.0–46.0)
Hemoglobin: 9.5 g/dL — ABNORMAL LOW (ref 12.0–15.0)
Lymphocytes Relative: 12 %
Lymphs Abs: 1 10*3/uL (ref 0.7–4.0)
MCH: 25.2 pg — ABNORMAL LOW (ref 26.0–34.0)
MCHC: 31.6 g/dL (ref 30.0–36.0)
MCV: 79.8 fL (ref 78.0–100.0)
Monocytes Absolute: 0.8 10*3/uL (ref 0.1–1.0)
Monocytes Relative: 10 %
Neutro Abs: 6.2 10*3/uL (ref 1.7–7.7)
Neutrophils Relative %: 77 %
Platelets: 280 10*3/uL (ref 150–400)
RBC: 3.77 MIL/uL — ABNORMAL LOW (ref 3.87–5.11)
RDW: 18.9 % — ABNORMAL HIGH (ref 11.5–15.5)
WBC: 8 10*3/uL (ref 4.0–10.5)

## 2016-11-19 LAB — PROTIME-INR
INR: 0.97
Prothrombin Time: 12.9 seconds (ref 11.4–15.2)

## 2016-11-19 LAB — CBG MONITORING, ED
Glucose-Capillary: 114 mg/dL — ABNORMAL HIGH (ref 65–99)
Glucose-Capillary: 116 mg/dL — ABNORMAL HIGH (ref 65–99)

## 2016-11-19 LAB — APTT: aPTT: 38 seconds — ABNORMAL HIGH (ref 24–36)

## 2016-11-19 LAB — TROPONIN I: Troponin I: <0.03 ng/mL (ref <0.03)

## 2016-11-19 LAB — D-DIMER, QUANTITATIVE (NOT AT ARMC): D-Dimer, Quant: 2.71 ug/mL-FEU — ABNORMAL HIGH (ref 0.00–0.50)

## 2016-11-19 LAB — HEPARIN LEVEL (UNFRACTIONATED): Heparin Unfractionated: 0.31 IU/mL (ref 0.30–0.70)

## 2016-11-19 MED ORDER — OLOPATADINE HCL 0.6 % NA SOLN: 1.0000 [drp] | Freq: Every day | NASAL | Status: DC | PRN

## 2016-11-19 MED ORDER — POLYVINYL ALCOHOL 1.4 % OP SOLN
1.0000 [drp] | Freq: Three times a day (TID) | OPHTHALMIC | Status: DC | PRN
  Administered 2016-11-19 – 2016-11-20 (×2): 1 [drp] via OPHTHALMIC
  Filled 2016-11-19: qty 15

## 2016-11-19 MED ORDER — FENTANYL CITRATE (PF) 100 MCG/2ML IJ SOLN
50.0000 ug | Freq: Once | INTRAMUSCULAR | Status: AC
  Administered 2016-11-19: 50 ug via INTRAVENOUS
  Filled 2016-11-19: qty 2

## 2016-11-19 MED ORDER — IOPAMIDOL (ISOVUE-370) INJECTION 76%
100.0000 mL | Freq: Once | INTRAVENOUS | Status: AC | PRN
  Administered 2016-11-19: 100 mL via INTRAVENOUS

## 2016-11-19 MED ORDER — HEPARIN (PORCINE) IN NACL 100-0.45 UNIT/ML-% IJ SOLN
1200.0000 [IU]/h | INTRAMUSCULAR | Status: DC
  Administered 2016-11-19: 1100 [IU]/h via INTRAVENOUS
  Administered 2016-11-20 – 2016-11-21 (×2): 1200 [IU]/h via INTRAVENOUS
  Filled 2016-11-19 (×3): qty 250

## 2016-11-19 MED ORDER — MONTELUKAST SODIUM 10 MG PO TABS
10.0000 mg | ORAL_TABLET | Freq: Every day | ORAL | Status: DC
  Administered 2016-11-19 – 2016-11-21 (×3): 10 mg via ORAL
  Filled 2016-11-19 (×3): qty 1

## 2016-11-19 MED ORDER — ONDANSETRON HCL 4 MG/2ML IJ SOLN
INTRAMUSCULAR | Status: AC
  Administered 2016-11-19: 4 mg via INTRAVENOUS
  Filled 2016-11-19: qty 2

## 2016-11-19 MED ORDER — MOMETASONE FURO-FORMOTEROL FUM 200-5 MCG/ACT IN AERO
2.0000 | INHALATION_SPRAY | Freq: Two times a day (BID) | RESPIRATORY_TRACT | Status: DC
  Administered 2016-11-20 – 2016-11-21 (×2): 2 via RESPIRATORY_TRACT
  Filled 2016-11-19: qty 8.8

## 2016-11-19 MED ORDER — OLOPATADINE HCL 0.1 % OP SOLN
1.0000 [drp] | Freq: Every day | OPHTHALMIC | Status: DC | PRN
  Filled 2016-11-19: qty 5

## 2016-11-19 MED ORDER — PANTOPRAZOLE SODIUM 40 MG PO TBEC
40.0000 mg | DELAYED_RELEASE_TABLET | Freq: Every day | ORAL | Status: DC
  Administered 2016-11-19 – 2016-11-22 (×4): 40 mg via ORAL
  Filled 2016-11-19 (×3): qty 1

## 2016-11-19 MED ORDER — FENTANYL CITRATE (PF) 100 MCG/2ML IJ SOLN
50.0000 ug | Freq: Once | INTRAMUSCULAR | Status: AC
  Administered 2016-11-19: 50 ug via INTRAVENOUS

## 2016-11-19 MED ORDER — KETOROLAC TROMETHAMINE 30 MG/ML IJ SOLN
30.0000 mg | Freq: Once | INTRAMUSCULAR | Status: AC
  Administered 2016-11-19: 30 mg via INTRAVENOUS
  Filled 2016-11-19: qty 1

## 2016-11-19 MED ORDER — INSULIN ASPART 100 UNIT/ML ~~LOC~~ SOLN
0.0000 [IU] | Freq: Three times a day (TID) | SUBCUTANEOUS | Status: DC
  Administered 2016-11-20: 1 [IU] via SUBCUTANEOUS

## 2016-11-19 MED ORDER — PRAVASTATIN SODIUM 40 MG PO TABS
40.0000 mg | ORAL_TABLET | Freq: Every day | ORAL | Status: DC
Start: 2016-11-19 — End: 2016-11-22
  Administered 2016-11-19 – 2016-11-22 (×4): 40 mg via ORAL
  Filled 2016-11-19 (×5): qty 1

## 2016-11-19 MED ORDER — POLYETHYLENE GLYCOL 3350 17 G PO PACK
17.0000 g | PACK | Freq: Every day | ORAL | Status: DC | PRN
  Administered 2016-11-20 – 2016-11-21 (×2): 17 g via ORAL
  Filled 2016-11-19 (×2): qty 1

## 2016-11-19 MED ORDER — FLUTICASONE PROPIONATE 50 MCG/ACT NA SUSP
2.0000 | Freq: Every day | NASAL | Status: DC
  Administered 2016-11-19 – 2016-11-22 (×4): 2 via NASAL
  Filled 2016-11-19 (×2): qty 16

## 2016-11-19 MED ORDER — ALPRAZOLAM 0.5 MG PO TABS
0.5000 mg | ORAL_TABLET | Freq: Once | ORAL | Status: AC
  Administered 2016-11-19: 0.5 mg via ORAL
  Filled 2016-11-19: qty 1

## 2016-11-19 MED ORDER — HYDROXYZINE HCL 25 MG PO TABS: 50.0000 mg | ORAL_TABLET | Freq: Three times a day (TID) | ORAL | Status: DC | PRN

## 2016-11-19 MED ORDER — LISINOPRIL 10 MG PO TABS
10.0000 mg | ORAL_TABLET | Freq: Every day | ORAL | Status: DC
Start: 2016-11-19 — End: 2016-11-19

## 2016-11-19 MED ORDER — ONDANSETRON HCL 4 MG PO TABS
4.0000 mg | ORAL_TABLET | Freq: Four times a day (QID) | ORAL | Status: DC | PRN
  Administered 2016-11-21: 4 mg via ORAL
  Filled 2016-11-19: qty 1

## 2016-11-19 MED ORDER — FAMOTIDINE 20 MG PO TABS
20.0000 mg | ORAL_TABLET | Freq: Every day | ORAL | Status: DC
  Administered 2016-11-19 – 2016-11-21 (×3): 20 mg via ORAL
  Filled 2016-11-19 (×4): qty 1

## 2016-11-19 MED ORDER — ONDANSETRON HCL 4 MG/2ML IJ SOLN
4.0000 mg | Freq: Once | INTRAMUSCULAR | Status: AC
  Administered 2016-11-19: 4 mg via INTRAVENOUS

## 2016-11-19 MED ORDER — BISACODYL 5 MG PO TBEC
5.0000 mg | DELAYED_RELEASE_TABLET | Freq: Every day | ORAL | Status: DC
  Administered 2016-11-20 – 2016-11-22 (×3): 5 mg via ORAL
  Filled 2016-11-19 (×3): qty 1

## 2016-11-19 MED ORDER — ONDANSETRON HCL 4 MG/2ML IJ SOLN
4.0000 mg | Freq: Once | INTRAMUSCULAR | Status: AC
Start: 2016-11-19 — End: 2016-11-19
  Administered 2016-11-19: 4 mg via INTRAVENOUS
  Filled 2016-11-19: qty 2

## 2016-11-19 MED ORDER — MORPHINE SULFATE (PF) 2 MG/ML IV SOLN
2.0000 mg | INTRAVENOUS | Status: DC | PRN
  Filled 2016-11-19: qty 1

## 2016-11-19 MED ORDER — HYDRALAZINE HCL 20 MG/ML IJ SOLN: 10.0000 mg | Freq: Four times a day (QID) | INTRAMUSCULAR | Status: DC | PRN

## 2016-11-19 MED ORDER — ALBUTEROL SULFATE (2.5 MG/3ML) 0.083% IN NEBU: 2.5000 mg | INHALATION_SOLUTION | RESPIRATORY_TRACT | Status: DC | PRN

## 2016-11-19 MED ORDER — HEPARIN BOLUS VIA INFUSION
4000.0000 [IU] | Freq: Once | INTRAVENOUS | Status: AC
  Administered 2016-11-19: 4000 [IU] via INTRAVENOUS

## 2016-11-19 MED ORDER — SODIUM CHLORIDE 0.9 % IV BOLUS (SEPSIS)
1000.0000 mL | Freq: Once | INTRAVENOUS | Status: AC
  Administered 2016-11-19: 1000 mL via INTRAVENOUS

## 2016-11-19 MED ORDER — GUAIFENESIN-CODEINE 100-10 MG/5ML PO SOLN: 5.0000 mL | Freq: Four times a day (QID) | ORAL | Status: DC | PRN

## 2016-11-19 MED ORDER — FENTANYL CITRATE (PF) 100 MCG/2ML IJ SOLN
INTRAMUSCULAR | Status: AC
  Administered 2016-11-19: 50 ug via INTRAVENOUS
  Filled 2016-11-19: qty 2

## 2016-11-19 MED ORDER — OXYCODONE-ACETAMINOPHEN 5-325 MG PO TABS
1.0000 | ORAL_TABLET | Freq: Four times a day (QID) | ORAL | Status: DC | PRN
  Administered 2016-11-19: 1 via ORAL
  Filled 2016-11-19: qty 1

## 2016-11-19 MED ORDER — INSULIN ASPART 100 UNIT/ML ~~LOC~~ SOLN: 0.0000 [IU] | Freq: Every day | SUBCUTANEOUS | Status: DC

## 2016-11-19 MED ORDER — FUROSEMIDE 20 MG PO TABS
20.0000 mg | ORAL_TABLET | Freq: Every day | ORAL | Status: DC
  Administered 2016-11-19 – 2016-11-22 (×4): 20 mg via ORAL
  Filled 2016-11-19 (×4): qty 1

## 2016-11-19 MED ORDER — BENZONATATE 100 MG PO CAPS: 200.0000 mg | ORAL_CAPSULE | Freq: Three times a day (TID) | ORAL | Status: DC | PRN

## 2016-11-19 MED ORDER — ONDANSETRON HCL 4 MG/2ML IJ SOLN: 4.0000 mg | Freq: Four times a day (QID) | INTRAMUSCULAR | Status: DC | PRN

## 2016-11-19 MED ORDER — OXYCODONE-ACETAMINOPHEN 5-325 MG PO TABS
1.0000 | ORAL_TABLET | Freq: Four times a day (QID) | ORAL | Status: DC | PRN
  Administered 2016-11-19: 1 via ORAL
  Administered 2016-11-20: 2 via ORAL
  Filled 2016-11-19: qty 2
  Filled 2016-11-19 (×2): qty 1

## 2016-11-19 MED ORDER — LATANOPROST 0.005 % OP SOLN
1.0000 [drp] | Freq: Every day | OPHTHALMIC | Status: DC
  Administered 2016-11-19 – 2016-11-21 (×3): 1 [drp] via OPHTHALMIC
  Filled 2016-11-19: qty 2.5

## 2016-11-19 MED ORDER — CYPROHEPTADINE HCL 4 MG PO TABS
4.0000 mg | ORAL_TABLET | Freq: Every day | ORAL | Status: DC
  Filled 2016-11-19 (×3): qty 1

## 2016-11-19 MED ORDER — ALPRAZOLAM 0.5 MG PO TABS
0.5000 mg | ORAL_TABLET | Freq: Two times a day (BID) | ORAL | Status: DC | PRN
  Administered 2016-11-19 – 2016-11-22 (×5): 0.5 mg via ORAL
  Filled 2016-11-19 (×6): qty 1

## 2016-11-19 MED ORDER — CARBOXYMETHYLCELLULOSE SODIUM 0.5 % OP SOLN: 1.0000 [drp] | Freq: Three times a day (TID) | OPHTHALMIC | Status: DC | PRN

## 2016-11-19 MED ORDER — HEPARIN SODIUM (PORCINE) 5000 UNIT/ML IJ SOLN: 60.0000 [IU]/kg | Freq: Once | INTRAMUSCULAR | Status: DC

## 2016-11-19 MED ORDER — ONDANSETRON HCL 4 MG/2ML IJ SOLN
4.0000 mg | Freq: Once | INTRAMUSCULAR | Status: AC
  Administered 2016-11-19: 4 mg via INTRAVENOUS
  Filled 2016-11-19: qty 2

## 2016-11-19 NOTE — H&P (Signed)
TRH H&P   Patient Demographics:    Jennifer Mullins, is a 46 y.o. female  MRN: [id number]   DOB - 1971/07/21  Admit Date - 11/19/2016  Outpatient Primary MD for the patient is Mary-Margaret Hassell Done, FNP  Outpatient Specialists: Dr Dutch Gray    Patient coming from: Home  Chief Complaint  Patient presents with  . Neck Pain  . Shortness of Breath      HPI:    Jennifer Mullins  is a 46 y.o. female, With history of hypertension, DM type II, COPD, C-spine DJD who had a C-spine surgery by Dr. Lorin Mercy at [facility] on 11/13/2017, she was discharged with a c-collar in good condition. Apparently patient chronically had neck and left arm pain with tingling numbness in the left arm, sometime yesterday morning she started having some left-sided chest pain and shortness of breath. Pain was pleuritic nonradiating, worse with deep breaths better with rest and pain medications.   Came to the ER where she was diagnosed with left lingular PE without hemodynamic compromise or right heart strain, she was started on heparin in the ER and I was called to admit. At this time besides the ongoing postop neck pain which radiates to left arm and shoulder, pleuritic left-sided chest pain and mild exertional shortness of breath patient is symptom-free.  She denies any fever chills, no shortness of breath at rest, no productive cough, no headache, no abdominal pain, no blood in stool or urine, no dysuria or focal weakness.    Review of systems:    In addition to the HPI above,  No Fever-chills, No Headache, No changes with Vision or hearing, No problems swallowing food or Liquids, As above Chest pain, Cough or Shortness of Breath, No  Abdominal pain, No Nausea or Vommitting, Bowel movements are regular, No Blood in stool or Urine, No dysuria, No new skin rashes or bruises, No new joints pains-aches, except neck pain radiating to left shoulder and arm as above No new weakness, tingling, numbness in any extremity, No recent weight gain or loss, No polyuria, polydypsia or polyphagia, No significant Mental Stressors.  A full 10 point Review of Systems was done, except as stated above, all other Review of Systems were negative.   With Past History of the following :    Past Medical History:  Diagnosis  Date  . Anemia   . Anxiety   . Arthritis   . Asthma   . Bilateral calcaneal spurs   . Bilateral polycystic ovarian syndrome   . Bulging lumbar disc   . Carpal tunnel syndrome, bilateral   . COPD (chronic obstructive pulmonary disease) (HCC)    BRONCHITIS  . Diabetes mellitus without complication ([place])    TYPE 2   DX  4-5 YRS AGO  . Fibroids    uterine  . GAD (generalized anxiety disorder)   . GERD (gastroesophageal reflux disease)    TAKES PRESCRIPTION MEDS  . Glaucoma    BOTH EYES  . Hyperlipidemia   . Hypertension   . Hypothyroidism    NODULES ON THYROID  . PONV (postoperative nausea and vomiting)   . Tachycardia       Past Surgical History:  Procedure Laterality Date  . ANTERIOR CERVICAL DECOMP/DISCECTOMY FUSION N/A 11/13/2016   Procedure: Cervical five-six, Cervical six-seven Anterior Cervical Discectomy and Fusion, Allograft, Plate;  Surgeon: Marybelle Killings, MD;  Location: [place];  Service: Orthopedics;  Laterality: N/A;  . BREAST SURGERY    . CARPAL TUNNEL RELEASE    . DILATION AND CURETTAGE OF UTERUS    . EXPLORATORY LAPAROTOMY    . NASAL SINUS SURGERY  07/22/13   Dr. Redmond Pulling in [place] History:     Social History  Substance Use Topics  . Smoking status: Former Smoker    Packs/day: 0.25    Years: 5.00    Types: Cigarettes    Quit date: 11/18/1989  . Smokeless tobacco: Never Used    . Alcohol use Yes     Comment: occasional         Family History :     Family History  Problem Relation Age of Onset  . Hypertension Mother   . Hypertension Father        Home Medications:   Prior to Admission medications   Medication Sig Start Date End Date Taking? Authorizing Provider  albuterol (PROVENTIL HFA;VENTOLIN HFA) 108 (90 Base) MCG/ACT inhaler Inhale 1-2 puffs into the lungs daily as needed. Rescue inhaler Patient taking differently: Inhale 1-2 puffs into the lungs daily as needed for wheezing or shortness of breath. Rescue inhaler 11/04/16  Yes Mary-Margaret Hassell Done, FNP  ALPRAZolam Duanne Moron) 0.5 MG tablet Take 1 tablet (0.5 mg total) by mouth 2 (two) times daily as needed. Patient taking differently: Take 0.5 mg by mouth 2 (two) times daily as needed for anxiety.  10/04/16  Yes Mary-Margaret Hassell Done, FNP  aspirin EC 81 MG tablet Take 1 tablet (81 mg total) by mouth daily. 04/02/16  Yes Sharion Balloon, FNP  benzonatate (TESSALON) 200 MG capsule Take 200 mg by mouth 3 (three) times daily as needed for cough.   Yes Historical Provider, MD  bimatoprost (LUMIGAN) 0.01 % SOLN Place 1 drop into both eyes at bedtime.    Yes Historical Provider, MD  budesonide-formoterol (SYMBICORT) 160-4.5 MCG/ACT inhaler Inhale 2 puffs into the lungs 2 (two) times daily.   Yes Historical Provider, MD  carboxymethylcellulose (REFRESH PLUS) 0.5 % SOLN Place 1 drop into both eyes 3 (three) times daily as needed (dryness).    Yes Historical Provider, MD  cyproheptadine (PERIACTIN) 4 MG tablet Take 1 tablet (4 mg total) by mouth at bedtime. 11/04/16  Yes Mary-Margaret Hassell Done, FNP  famotidine (PEPCID) 20 MG tablet Take 1 tablet (20 mg total) by mouth at bedtime. 10/04/16  Yes Mary-Margaret Hassell Done, FNP  fluticasone (FLONASE) 50 MCG/ACT nasal spray Place 2 sprays into both nostrils daily.   Yes Historical Provider, MD  furosemide (LASIX) 20 MG tablet Take 1 tablet (20 mg total) by mouth daily. 10/04/16   Yes Mary-Margaret Hassell Done, FNP  hydrOXYzine (ATARAX/VISTARIL) 50 MG tablet Take 1 tablet (50 mg total) by mouth 3 (three) times daily as needed for itching. 11/04/16  Yes Mary-Margaret Hassell Done, FNP  ipratropium (ATROVENT) 0.03 % nasal spray Place 2 sprays into both nostrils every 12 (twelve) hours. Patient taking differently: Place 2 sprays into both nostrils every 12 (twelve) hours as needed for rhinitis.  08/09/15  Yes Mary-Margaret Hassell Done, FNP  lisinopril (PRINIVIL,ZESTRIL) 10 MG tablet Take 1 tablet (10 mg total) by mouth daily. 10/04/16  Yes Mary-Margaret Hassell Done, FNP  metFORMIN (GLUCOPHAGE) 500 MG tablet Take 1 tablet (500 mg total) by mouth 2 (two) times daily with a meal. Patient taking differently: Take 500 mg by mouth 2 (two) times daily as needed (depending on sugar).  10/04/16  Yes Mary-Margaret Hassell Done, FNP  montelukast (SINGULAIR) 10 MG tablet TAKE 1 TABLET BY MOUTH AT BEDTIME 10/31/16  Yes Mary-Margaret Hassell Done, FNP  Olopatadine HCl 0.6 % SOLN Place 1 drop (1 puff total) into the nose every morning. Patient taking differently: Place 1 drop into both eyes daily as needed (alergies).  11/04/16  Yes Mary-Margaret Hassell Done, FNP  oxyCODONE-acetaminophen (PERCOCET/ROXICET) 5-325 MG tablet Take 1 tablet by mouth every 6 (six) hours as needed for moderate pain. 11/15/16  Yes Lanae Crumbly, PA-C  pantoprazole (PROTONIX) 40 MG tablet Take 1 tablet (40 mg total) by mouth daily. 10/04/16  Yes Mary-Margaret Hassell Done, FNP  pravastatin (PRAVACHOL) 40 MG tablet Take 1 tablet (40 mg total) by mouth daily. 10/04/16  Yes Mary-Margaret Hassell Done, FNP  pseudoephedrine-guaifenesin (MUCINEX D) 60-600 MG 12 hr tablet Take 1 tablet by mouth every 12 (twelve) hours as needed for congestion.     Historical Provider, MD     Allergies:     Allergies  Allergen Reactions  . Demerol [Meperidine] Shortness Of Breath    FLUSHING AND SHORTNESS OF BREATH  . Morphine And Related Shortness Of Breath    Flushed and hot hyper  .  Diltiazem Hcl Hives     Physical Exam:   Vitals  Blood pressure 128/78, pulse 96, temperature 98.5 F (36.9 C), temperature source Oral, resp. rate 16, height 5' 1.5" (1.562 m), weight 64.4 kg (142 lb), last menstrual period 11/03/2016, SpO2 100 %.   1. General Thin middle-aged African-American female lying in bed in NAD,    2. Normal affect and insight, Not Suicidal or Homicidal, Awake Alert, Oriented X 3.  3. No F.N deficits, ALL C.Nerves Intact, Strength 5/5 all 4 extremities, left arm is slightly weaker than right she says this is chronic, Sensation intact all 4 extremities, Plantars down going.  4. Ears and Eyes appear Normal, Conjunctivae clear, PERRLA. Moist Oral Mucosa.  5. She is wearing a c-collar, anterior neck area looks clean without any signs of infection, No JVD, No cervical lymphadenopathy appriciated, No Carotid Bruits.  6. Symmetrical Chest wall movement, Good air movement bilaterally, CTAB.  7. RRR, No Gallops, Rubs or Murmurs, No Parasternal Heave.  8. Positive Bowel Sounds, Abdomen Soft, No tenderness, No organomegaly appriciated,No rebound -guarding or rigidity.  9.  No Cyanosis, Normal Skin Turgor, No Skin Rash or Bruise.  10. Good muscle tone,  joints appear normal , no effusions, Normal ROM.  11. No Palpable Lymph Nodes in Neck or Axillae  Data Review:    CBC  Recent Labs Lab 11/14/16 0540 11/19/16 0851  WBC 6.5 8.0  HGB 8.6* 9.5*  HCT 27.7* 30.1*  PLT 197 280  MCV 76.9* 79.8  MCH 23.9* 25.2*  MCHC 31.0 31.6  RDW 19.0* 18.9*  LYMPHSABS  --  1.0  MONOABS  --  0.8  EOSABS  --  0.0  BASOSABS  --  0.0   ------------------------------------------------------------------------------------------------------------------  Chemistries   Recent Labs Lab 11/14/16 0540 11/19/16 0851  NA 134* 134*  K 3.6 3.8  CL 104 100*  CO2 27 27  GLUCOSE 96 107*  BUN 5* 8  CREATININE 0.59 0.58  CALCIUM 8.2* 9.4  AST  --  12*  ALT  --  12*    ALKPHOS  --  60  BILITOT  --  0.3   ------------------------------------------------------------------------------------------------------------------ estimated creatinine clearance is 75.8 mL/min (by C-G formula based on SCr of 0.58 mg/dL). ------------------------------------------------------------------------------------------------------------------ No results for input(s): TSH, T4TOTAL, T3FREE, THYROIDAB in the last 72 hours.  Invalid input(s): FREET3  Coagulation profile  Recent Labs Lab 11/19/16 1214  INR 0.97   -------------------------------------------------------------------------------------------------------------------  Recent Labs  11/19/16 0851  DDIMER 2.71*   -------------------------------------------------------------------------------------------------------------------  Cardiac Enzymes  Recent Labs Lab 11/19/16 0851  TROPONINI <0.03   ------------------------------------------------------------------------------------------------------------------ No results found for: BNP   ---------------------------------------------------------------------------------------------------------------  Urinalysis    Component Value Date/Time   COLORURINE YELLOW 11/07/2016 [place] 11/07/2016 1014   LABSPEC 1.012 11/07/2016 1014   PHURINE 6.0 11/07/2016 1014   GLUCOSEU NEGATIVE 11/07/2016 1014   HGBUR MODERATE (A) 11/07/2016 1014   BILIRUBINUR NEGATIVE 11/07/2016 1014   BILIRUBINUR neg 10/31/2014 [place] 11/07/2016 1014   PROTEINUR NEGATIVE 11/07/2016 1014   UROBILINOGEN negative 10/31/2014 1347   NITRITE NEGATIVE 11/07/2016 1014   LEUKOCYTESUR NEGATIVE 11/07/2016 1014    ----------------------------------------------------------------------------------------------------------------   Imaging Results:    Dg Chest 2 View  Result Date: 11/19/2016 CLINICAL DATA:  Left side chest pain. EXAM: CHEST  2 VIEW COMPARISON:   04/02/2016 FINDINGS: Airspace opacity noted at the left base concerning for pneumonia. Right lung is clear. Heart is normal size. No effusions or acute bony abnormality. IMPRESSION: Left basilar opacity concerning for pneumonia. Electronically Signed   By: Rolm Baptise M.D.   On: 11/19/2016 09:17   Ct Angio Chest Pe W And/or Wo Contrast  Result Date: 11/19/2016 CLINICAL DATA:  46 year old female status post cervical spine she surgery 6 days ago with left anterior chest and shoulder pain. Hemoptysis for 3 days. Initial encounter. EXAM: CT ANGIOGRAPHY CHEST WITH CONTRAST TECHNIQUE: Multidetector CT imaging of the chest was performed using the standard protocol during bolus administration of intravenous contrast. Multiplanar CT image reconstructions and MIPs were obtained to evaluate the vascular anatomy. CONTRAST:  100 mL Isovue 370 COMPARISON:  Chest radiographs 0855 hours today. [facility] Chest CTA 06/21/2013 FINDINGS: Cardiovascular: Good contrast bolus timing in the pulmonary arterial tree. Positive for pulmonary emboli. Left lingula and possibly also anterior basal segment occlusive pulmonary thrombus. No left upper lobe involvement identified. No central or saddle embolus. No right lung involvement identified. The interventricular septum has a normal configuration without CT evidence of right heart strain. RV / LV ratio = 0.7. No pericardial effusion. Negative visualized aorta. Mediastinum/Nodes: No mediastinal lymphadenopathy. Lungs/Pleura: Major airways are patent. Small layering left pleural effusion. Confluent ground-glass opacity in the posterolateral lingula is observed by the most conspicuous area of occlusive thrombus and is compatible with pulmonary infarct (series  9, image 61). There is otherwise dependent opacity in the lower lobe which may simply reflect atelectasis. In the contralateral right lung there is mild dependent atelectasis. Upper Abdomen: Negative visualized liver,  spleen, pancreas, left adrenal gland, left kidney, and bowel in the upper abdomen. Other findings: Mild postoperative prevertebral fluid or inflammation at the cervicothoracic junction. Visualized thoracic inlet otherwise within normal limits. Musculoskeletal: Sequelae of lower cervical ACDF to the C7 level. Visible hardware and cervical vertebrae appear intact. Minimal thoracic scoliosis. No acute thoracic osseous abnormality. Review of the MIP images confirms the above findings. IMPRESSION: 1. Positive for acute pulmonary embolus with lingula involvement. Lingula pulmonary infarct. Small layering left pleural effusion. No central thrombus and no CTA evidence of right heart strain. 2. Critical Value/emergent results were called by telephone at the time of interpretation on 11/19/2016 at 11:20 am to Dr. Nat Christen , who verbally acknowledged these results. Electronically Signed   By: Genevie Ann M.D.   On: 11/19/2016 11:20    My personal review of EKG: Rhythm NSR, Rate  92/min,   no Acute ST changes   Assessment & Plan:     1. Left leg left PE. Discussed the case with Dr. Lorin Mercy her orthopedic surgeon, patient is stable for heparin drip which will be continued, pharmacy to monitor if she is stable for a few days on heparin drip and transitioned to Coumadin as it is easiest to reverse. Kindly check lower extremity venous duplex tomorrow. Continue supportive care.  2. Recent C-spine surgery. Discussed with Dr. Lorin Mercy, currently no signs of bleeding, monitor closely with c-collar, minimal activity on heparin drip, patient will be transferred to [facility] where Dr. Lorin Mercy will follow her. Pain control and supportive care, will order bedside, to minimize activity, further recommendations per orthopedics.  3. Hypertension. Currently blood pressure stable, she does have some cough with ACE inhibitor, for now hydralazine as needed, with try to minimize cough to make sure postop site does not bleed.  4. DM  type II. Hold Glucophage, check A1c, sliding scale.  5. COPD. Stable, no wheezing, continue supportive care with nebulizer treatments and oxygen as needed.  6. Dyslipidemia. On statin continue.  7. GERD. On PPI.    DVT Prophylaxis Heparin   AM Labs Ordered, also please review Full Orders  Family Communication: Admission, patients condition and plan of care including tests being ordered have been discussed with the patient  who indicates understanding and agree with the plan and Code Status.  Code Status Full  Likely DC to  Home 3-4 days  Condition GUARDED    Consults called: Ortho Dr Lorin Mercy    Admission status: Inpt    Time spent in minutes : 35   SINGH,PRASHANT K M.D on 11/19/2016 at 2:30 PM  Between 7am to 7pm - Pager - [phone]. After 7pm go to [email or web address] - password [facility]  [facility] - Office  [phone]

## 2016-11-19 NOTE — ED Notes (Signed)
[facility]link left with patient at this time

## 2016-11-19 NOTE — ED Notes (Signed)
reoprt given to [facility]link

## 2016-11-19 NOTE — ED Notes (Signed)
Per EDP, dressing to be placed over surgical site. Steri strips noted to site, telfa applied over steri strips and secured in placed with paper tape. Neck brace re-applied. Pt tolerated well.

## 2016-11-19 NOTE — ED Notes (Signed)
Per dr Candiss Norse, give percocet as ordered

## 2016-11-19 NOTE — ED Notes (Signed)
Pt taken to xray 

## 2016-11-19 NOTE — ED Notes (Signed)
hospitalist in with pt

## 2016-11-19 NOTE — Progress Notes (Signed)
ANTICOAGULATION CONSULT NOTE - Initial Consult  Pharmacy Consult for HEPARIN Indication: pulmonary embolus  Allergies  Allergen Reactions  . Demerol [Meperidine] Shortness Of Breath    FLUSHING AND SHORTNESS OF BREATH  . Morphine And Related Shortness Of Breath    Flushed and hot hyper  . Diltiazem Hcl Hives   Patient Measurements: Height: 5' 1.5" (156.2 cm) Weight: 142 lb (64.4 kg) IBW/kg (Calculated) : 48.95  Vital Signs: Temp: 98.5 F (36.9 C) (01/02 0805) Temp Source: Oral (01/02 0805) BP: 109/98 (01/02 1100) Pulse Rate: 92 (01/02 1115)  Labs:  Recent Labs  11/19/16 0851  HGB 9.5*  HCT 30.1*  PLT 280  CREATININE 0.58  TROPONINI <0.03   Estimated Creatinine Clearance: 75.8 mL/min (by C-G formula based on SCr of 0.58 mg/dL).  Medical History: Past Medical History:  Diagnosis Date  . Anemia   . Anxiety   . Arthritis   . Asthma   . Bilateral calcaneal spurs   . Bilateral polycystic ovarian syndrome   . Bulging lumbar disc   . Carpal tunnel syndrome, bilateral   . COPD (chronic obstructive pulmonary disease) (HCC)    BRONCHITIS  . Diabetes mellitus without complication ([place])    TYPE 2   DX  4-5 YRS AGO  . Fibroids    uterine  . GAD (generalized anxiety disorder)   . GERD (gastroesophageal reflux disease)    TAKES PRESCRIPTION MEDS  . Glaucoma    BOTH EYES  . Hyperlipidemia   . Hypertension   . Hypothyroidism    NODULES ON THYROID  . PONV (postoperative nausea and vomiting)   . Tachycardia    Medications:   (Not in a hospital admission)  Assessment: 46yo female, no anticoagulants listed on PTA med list.  Baseline labs pending.  Asked to initiate Heparin for PE.   Goal of Therapy:  Heparin level 0.3-0.7 units/ml Monitor platelets by anticoagulation protocol: Yes   Plan:  Heparin 4000 units IV bolus x 1 Heparin infusion at 1100 units/hr Heparin level in 6-8 hrs then daily CBC daily while on Heparin  [place] Crane, Scott A 11/19/2016,12:27 PM

## 2016-11-19 NOTE — Telephone Encounter (Signed)
Attempted to return patient's phone call.  Left message for patient to call back.  Patient is currently in ED for dyspnea after neck surgery 11/20/15

## 2016-11-19 NOTE — Progress Notes (Signed)
ANTICOAGULATION CONSULT NOTE - Follow Up Consult  Pharmacy Consult for PE Indication: pulmonary embolus  Allergies  Allergen Reactions  . Demerol [Meperidine] Shortness Of Breath    FLUSHING AND SHORTNESS OF BREATH  . Morphine And Related Shortness Of Breath    Flushed and hot hyper  . Diltiazem Hcl Hives    Patient Measurements: Height: 5' 1.5" (156.2 cm) Weight: 142 lb (64.4 kg) IBW/kg (Calculated) : 48.95 Heparin Dosing Weight: 62 kg Vital Signs: Temp: 99.5 F (37.5 C) (01/02 1836) Temp Source: Oral (01/02 1836) BP: 130/76 (01/02 1836) Pulse Rate: 107 (01/02 1836)  Labs:  Recent Labs  11/19/16 0851 11/19/16 1214 11/19/16 1834  HGB 9.5*  --   --   HCT 30.1*  --   --   PLT 280  --   --   APTT  --  38*  --   LABPROT  --  12.9  --   INR  --  0.97  --   HEPARINUNFRC  --   --  0.31  CREATININE 0.58  --   --   TROPONINI <0.03  --   --     Estimated Creatinine Clearance: 75.8 mL/min (by C-G formula based on SCr of 0.58 mg/dL).   Medications:  Prescriptions Prior to Admission  Medication Sig Dispense Refill Last Dose  . albuterol (PROVENTIL HFA;VENTOLIN HFA) 108 (90 Base) MCG/ACT inhaler Inhale 1-2 puffs into the lungs daily as needed. Rescue inhaler (Patient taking differently: Inhale 1-2 puffs into the lungs daily as needed for wheezing or shortness of breath. Rescue inhaler) 1 Inhaler 0 Past Week at Unknown time  . ALPRAZolam (XANAX) 0.5 MG tablet Take 1 tablet (0.5 mg total) by mouth 2 (two) times daily as needed. (Patient taking differently: Take 0.5 mg by mouth 2 (two) times daily as needed for anxiety. ) 60 tablet 1 11/18/2016 at Unknown time  . aspirin EC 81 MG tablet Take 1 tablet (81 mg total) by mouth daily. 90 tablet 0 11/18/2016 at Unknown time  . benzonatate (TESSALON) 200 MG capsule Take 200 mg by mouth 3 (three) times daily as needed for cough.   11/18/2016 at Unknown time  . bimatoprost (LUMIGAN) 0.01 % SOLN Place 1 drop into both eyes at bedtime.     11/18/2016 at Unknown time  . budesonide-formoterol (SYMBICORT) 160-4.5 MCG/ACT inhaler Inhale 2 puffs into the lungs 2 (two) times daily.   11/13/2016 at Unknown time  . carboxymethylcellulose (REFRESH PLUS) 0.5 % SOLN Place 1 drop into both eyes 3 (three) times daily as needed (dryness).    11/18/2016 at Unknown time  . cyproheptadine (PERIACTIN) 4 MG tablet Take 1 tablet (4 mg total) by mouth at bedtime. 30 tablet 1 Past Month at Unknown time  . famotidine (PEPCID) 20 MG tablet Take 1 tablet (20 mg total) by mouth at bedtime. 30 tablet 6 11/18/2016 at Unknown time  . fluticasone (FLONASE) 50 MCG/ACT nasal spray Place 2 sprays into both nostrils daily.   11/18/2016 at Unknown time  . furosemide (LASIX) 20 MG tablet Take 1 tablet (20 mg total) by mouth daily. 30 tablet 5 11/18/2016 at Unknown time  . hydrOXYzine (ATARAX/VISTARIL) 50 MG tablet Take 1 tablet (50 mg total) by mouth 3 (three) times daily as needed for itching. 30 tablet 2 Past Week at Unknown time  . ipratropium (ATROVENT) 0.03 % nasal spray Place 2 sprays into both nostrils every 12 (twelve) hours. (Patient taking differently: Place 2 sprays into both nostrils every 12 (twelve) hours  as needed for rhinitis. ) 30 mL 4 Past Month at Unknown time  . lisinopril (PRINIVIL,ZESTRIL) 10 MG tablet Take 1 tablet (10 mg total) by mouth daily. 90 tablet 3 11/12/2016 at Unknown time  . metFORMIN (GLUCOPHAGE) 500 MG tablet Take 1 tablet (500 mg total) by mouth 2 (two) times daily with a meal. (Patient taking differently: Take 500 mg by mouth 2 (two) times daily as needed (depending on sugar). ) 60 tablet 5 11/18/2016 at Unknown time  . montelukast (SINGULAIR) 10 MG tablet TAKE 1 TABLET BY MOUTH AT BEDTIME 30 tablet 5 11/18/2016 at Unknown time  . Olopatadine HCl 0.6 % SOLN Place 1 drop (1 puff total) into the nose every morning. (Patient taking differently: Place 1 drop into both eyes daily as needed (alergies). ) 30.5 g 3 11/18/2016 at Unknown time  .  oxyCODONE-acetaminophen (PERCOCET/ROXICET) 5-325 MG tablet Take 1 tablet by mouth every 6 (six) hours as needed for moderate pain. 60 tablet 0 11/19/2016 at Unknown time  . pantoprazole (PROTONIX) 40 MG tablet Take 1 tablet (40 mg total) by mouth daily. 30 tablet 5 11/18/2016 at Unknown time  . pravastatin (PRAVACHOL) 40 MG tablet Take 1 tablet (40 mg total) by mouth daily. 90 tablet 1 11/18/2016 at Unknown time  . pseudoephedrine-guaifenesin (MUCINEX D) 60-600 MG 12 hr tablet Take 1 tablet by mouth every 12 (twelve) hours as needed for congestion.    Past Week at Unknown time    Assessment: 46 yo F presented to [facility] with neck pain and SOB.  Pt is s/p C-spine sx on 12/27 and developed L sided CP and SOB.  Presented to [facility] ED and diagnosed with left lingular PE without hemodynamic compromise or right heart strain, she was started on heparin in the ER.  Tx MC.   Heparin level 0.31 on 1100 units/hr  Goal of Therapy:  Heparin level 0.3-0.7 units/ml Monitor platelets by anticoagulation protocol: Yes   Plan:  Increase heparin to 1200 units/hr to target mid-range of goal. Next heparin level at 0300 1/3 for confirmation Heparin level, CBC daily while on heparin.  [employer], Pharm.D., BCPS Clinical Pharmacist Pager [phone] 11/19/2016 9:03 PM

## 2016-11-19 NOTE — ED Notes (Signed)
Pt has called out several times due to pain rating 8 and very uncomfortable. Hob adjusted several times and tried extra pillows and rolled towels. Pt states is like this a home but feels the neck pain is getting worse. Pt stated she did not want any more pain meds.

## 2016-11-19 NOTE — Progress Notes (Signed)
NURSING PROGRESS NOTE  [facility] Billing [id number] Admission Data: 11/19/2016 at 9PM Attending Provider: Thurnell Lose, MD PCP: Chevis Pretty, FNP Code status: Full  Allergies:  Allergies  Allergen Reactions  . Demerol [Meperidine] Shortness Of Breath    FLUSHING AND SHORTNESS OF BREATH  . Morphine And Related Shortness Of Breath    Flushed and hot hyper  . Diltiazem Hcl Hives    Past Medical History:  Past Medical History:  Diagnosis Date  . Anemia   . Anxiety   . Arthritis   . Asthma   . Bilateral calcaneal spurs   . Bilateral polycystic ovarian syndrome   . Bulging lumbar disc   . Carpal tunnel syndrome, bilateral   . COPD (chronic obstructive pulmonary disease) (HCC)    BRONCHITIS  . Diabetes mellitus without complication ([place])    TYPE 2   DX  4-5 YRS AGO  . Fibroids    uterine  . GAD (generalized anxiety disorder)   . GERD (gastroesophageal reflux disease)    TAKES PRESCRIPTION MEDS  . Glaucoma    BOTH EYES  . Hyperlipidemia   . Hypertension   . Hypothyroidism    NODULES ON THYROID  . PONV (postoperative nausea and vomiting)   . Tachycardia    Past Surgical History:  Past Surgical History:  Procedure Laterality Date  . ANTERIOR CERVICAL DECOMP/DISCECTOMY FUSION N/A 11/13/2016   Procedure: Cervical five-six, Cervical six-seven Anterior Cervical Discectomy and Fusion, Allograft, Plate;  Surgeon: Marybelle Killings, MD;  Location: [place];  Service: Orthopedics;  Laterality: N/A;  . BREAST SURGERY    . CARPAL TUNNEL RELEASE    . DILATION AND CURETTAGE OF UTERUS    . EXPLORATORY LAPAROTOMY    . NASAL SINUS SURGERY  07/22/13   Dr. Redmond Pulling in [place] is a 46 y.o. female patient, arrived to floor in room 5W07 via stretcher, transferred from [facility] via [place]. Patient alert and oriented X 4. No acute distress noted. Complains of pain 10/10 neck, back, and chest pain. MD notified.   Vital signs: Oral temperature 98.3 F (36.8 C),  Blood pressure 140/79, Pulse 98, RR 18, SpO2 100 % on 2 liters.  Cardiac monitoring: Telemetry box 5W #14 in place. Second verified by Valetta Mole, Nurse Tech  IV access: Left AC-infusing Heparin drip at 11mL/hr upon arrival ; condition patent and no redness.  Skin: intact, no pressure ulcer noted in sacral area. Patient had neck surgery 6 days ago and has a clean,dry, and intact dressing on her throat underneath a neck brace.  Patient's ID armband verified with patient and in place. Information packet given to patient. Fall risk assessed, SR up X2, patient able to verbalize understanding of risks associated with falls and to call nurse or staff to assist before getting out of bed. Patient oriented to room and equipment. Call bell within reach.

## 2016-11-19 NOTE — ED Notes (Signed)
Pt taken to ct. advisted ct tech per edp to have pillow and rolled towel under head

## 2016-11-19 NOTE — ED Provider Notes (Signed)
[facility] DEPT Provider Note   CSN: [id number] Arrival date & time: 11/19/16  0756  By signing my name below, I, Jeanell Sparrow, attest that this documentation has been prepared under the direction and in the presence of Nat Christen, MD . Electronically Signed: Jeanell Sparrow, Scribe. 11/19/2016. 8:05 AM.  History   Chief Complaint Chief Complaint  Patient presents with  . Neck Pain  . Shortness of Breath   The history is provided by the patient. No language interpreter was used.   HPI Comments: Jennifer Mullins is a 46 y.o. female who presents to the Emergency Department complaining of constant moderate neck pain that started about 5 days ago. She states she had a anterior neck surgery at [facility] 6 days ago by Dr. Sherrian Divers and C67 Anterior discectomy plus effusion plus allograft plus plating]. She has been wearing a neck brace and taking oxycodone with minimal relief. She describes the pain as radiating to her left shoulder and left breast, and exacerbated by movement. She has associated SOB with exertion. She has an anaphylaxtic allergy to morphine. She denies any inability to ambulate, or other complaints.    Neck Surgeon: Dr. Lorin Mercy PCP: Chevis Pretty, FNP  Past Medical History:  Diagnosis Date  . Anemia   . Anxiety   . Arthritis   . Asthma   . Bilateral calcaneal spurs   . Bilateral polycystic ovarian syndrome   . Bulging lumbar disc   . Carpal tunnel syndrome, bilateral   . COPD (chronic obstructive pulmonary disease) (HCC)    BRONCHITIS  . Diabetes mellitus without complication ([place])    TYPE 2   DX  4-5 YRS AGO  . Fibroids    uterine  . GAD (generalized anxiety disorder)   . GERD (gastroesophageal reflux disease)    TAKES PRESCRIPTION MEDS  . Glaucoma    BOTH EYES  . Hyperlipidemia   . Hypertension   . Hypothyroidism    NODULES ON THYROID  . PONV (postoperative nausea and vomiting)   . Tachycardia     Patient Active Problem List   Diagnosis Date  Noted  . Spondylosis of cervical region without myelopathy or radiculopathy 11/13/2016  . Allergic rhinoconjunctivitis 07/28/2015  . Chronic headache 07/28/2015  . PCOS (polycystic ovarian syndrome) 06/20/2015  . 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 Hyperlipidemia LDL goal <100 06/20/2015  . GAD (generalized anxiety disorder) 05/18/2014  . GERD (gastroesophageal reflux disease) 05/18/2014  . Body fluid retention 07/07/2013  . Chronic sinusitis 07/02/2013  . Extrinsic asthma 05/20/2013  . HTN (hypertension) 05/20/2013  . Diabetes ([place]) 05/20/2013    Past Surgical History:  Procedure Laterality Date  . ANTERIOR CERVICAL DECOMP/DISCECTOMY FUSION N/A 11/13/2016   Procedure: Cervical five-six, Cervical six-seven Anterior Cervical Discectomy and Fusion, Allograft, Plate;  Surgeon: Marybelle Killings, MD;  Location: [place];  Service: Orthopedics;  Laterality: N/A;  . BREAST SURGERY    . CARPAL TUNNEL RELEASE    . DILATION AND CURETTAGE OF UTERUS    . EXPLORATORY LAPAROTOMY    . NASAL SINUS SURGERY  07/22/13   Dr. Redmond Pulling in [place] History    No data available       Home Medications    Prior to Admission medications   Medication Sig Start Date End Date Taking? Authorizing Provider  albuterol (PROVENTIL HFA;VENTOLIN HFA) 108 (90 Base) MCG/ACT inhaler Inhale 1-2 puffs into the lungs daily as needed. Rescue inhaler Patient taking differently: Inhale 1-2 puffs into the  lungs daily as needed for wheezing or shortness of breath. Rescue inhaler 11/04/16  Yes Mary-Margaret Hassell Done, FNP  ALPRAZolam Duanne Moron) 0.5 MG tablet Take 1 tablet (0.5 mg total) by mouth 2 (two) times daily as needed. Patient taking differently: Take 0.5 mg by mouth 2 (two) times daily as needed for anxiety.  10/04/16  Yes Mary-Margaret Hassell Done, FNP  aspirin EC 81 MG tablet Take 1 tablet (81 mg total) by mouth daily. 04/02/16  Yes Sharion Balloon, FNP  benzonatate (TESSALON) 200 MG capsule Take 200 mg by mouth 3 (three)  times daily as needed for cough.   Yes Historical Provider, MD  bimatoprost (LUMIGAN) 0.01 % SOLN Place 1 drop into both eyes at bedtime.    Yes Historical Provider, MD  budesonide-formoterol (SYMBICORT) 160-4.5 MCG/ACT inhaler Inhale 2 puffs into the lungs 2 (two) times daily.   Yes Historical Provider, MD  carboxymethylcellulose (REFRESH PLUS) 0.5 % SOLN Place 1 drop into both eyes 3 (three) times daily as needed (dryness).    Yes Historical Provider, MD  cyproheptadine (PERIACTIN) 4 MG tablet Take 1 tablet (4 mg total) by mouth at bedtime. 11/04/16  Yes Mary-Margaret Hassell Done, FNP  famotidine (PEPCID) 20 MG tablet Take 1 tablet (20 mg total) by mouth at bedtime. 10/04/16  Yes Mary-Margaret Hassell Done, FNP  fluticasone (FLONASE) 50 MCG/ACT nasal spray Place 2 sprays into both nostrils daily.   Yes Historical Provider, MD  furosemide (LASIX) 20 MG tablet Take 1 tablet (20 mg total) by mouth daily. 10/04/16  Yes Mary-Margaret Hassell Done, FNP  hydrOXYzine (ATARAX/VISTARIL) 50 MG tablet Take 1 tablet (50 mg total) by mouth 3 (three) times daily as needed for itching. 11/04/16  Yes Mary-Margaret Hassell Done, FNP  ipratropium (ATROVENT) 0.03 % nasal spray Place 2 sprays into both nostrils every 12 (twelve) hours. Patient taking differently: Place 2 sprays into both nostrils every 12 (twelve) hours as needed for rhinitis.  08/09/15  Yes Mary-Margaret Hassell Done, FNP  lisinopril (PRINIVIL,ZESTRIL) 10 MG tablet Take 1 tablet (10 mg total) by mouth daily. 10/04/16  Yes Mary-Margaret Hassell Done, FNP  metFORMIN (GLUCOPHAGE) 500 MG tablet Take 1 tablet (500 mg total) by mouth 2 (two) times daily with a meal. Patient taking differently: Take 500 mg by mouth 2 (two) times daily as needed (depending on sugar).  10/04/16  Yes Mary-Margaret Hassell Done, FNP  montelukast (SINGULAIR) 10 MG tablet TAKE 1 TABLET BY MOUTH AT BEDTIME 10/31/16  Yes Mary-Margaret Hassell Done, FNP  Olopatadine HCl 0.6 % SOLN Place 1 drop (1 puff total) into the nose every  morning. Patient taking differently: Place 1 drop into both eyes daily as needed (alergies).  11/04/16  Yes Mary-Margaret Hassell Done, FNP  oxyCODONE-acetaminophen (PERCOCET/ROXICET) 5-325 MG tablet Take 1 tablet by mouth every 6 (six) hours as needed for moderate pain. 11/15/16  Yes Lanae Crumbly, PA-C  pantoprazole (PROTONIX) 40 MG tablet Take 1 tablet (40 mg total) by mouth daily. 10/04/16  Yes Mary-Margaret Hassell Done, FNP  pravastatin (PRAVACHOL) 40 MG tablet Take 1 tablet (40 mg total) by mouth daily. 10/04/16  Yes Mary-Margaret Hassell Done, FNP  pseudoephedrine-guaifenesin (MUCINEX D) 60-600 MG 12 hr tablet Take 1 tablet by mouth every 12 (twelve) hours as needed for congestion.     Historical Provider, MD    Family History Family History  Problem Relation Age of Onset  . Hypertension Mother   . Hypertension Father     Social History Social History  Substance Use Topics  . Smoking status: Former Smoker    Packs/day: 0.25  Years: 5.00    Types: Cigarettes    Quit date: 11/18/1989  . Smokeless tobacco: Never Used  . Alcohol use Yes     Comment: occasional     Allergies   Demerol [meperidine]; Morphine and related; and Diltiazem hcl   Review of Systems Review of Systems A complete 10 system review of systems was obtained and all systems are negative except as noted in the HPI and PMH.   Physical Exam Updated Vital Signs BP 140/77 (BP Location: Left Arm)   Pulse 91   Temp 98.5 F (36.9 C) (Oral)   Resp 22   Ht 5' 1.5" (1.562 m)   Wt 142 lb (64.4 kg)   LMP 11/03/2016   SpO2 100%   BMI 26.40 kg/m   Physical Exam  Constitutional: She is oriented to person, place, and time. She appears well-developed and well-nourished.  HENT:  Head: Normocephalic and atraumatic.  Eyes: Conjunctivae are normal.  Neck: Neck supple.  Healing wound to anterior neck that appears healthy. Slight TTP to anterior neck.  Cardiovascular: Normal rate and regular rhythm.   Pulmonary/Chest: Effort  normal and breath sounds normal.  Abdominal: Soft. Bowel sounds are normal.  Musculoskeletal: Normal range of motion.  Neurological: She is alert and oriented to person, place, and time.  Skin: Skin is warm and dry.  Psychiatric: She has a normal mood and affect. Her behavior is normal.  Nursing note and vitals reviewed.    ED Treatments / Results  DIAGNOSTIC STUDIES: Oxygen Saturation is 100% on RA, normal by my interpretation.    COORDINATION OF CARE: 8:15 AM- Pt advised of plan for treatment, which includes a chest x-ray and basic lab workup and pt agrees.  Labs (all labs ordered are listed, but only abnormal results are displayed) Labs Reviewed  CBC WITH DIFFERENTIAL/PLATELET - Abnormal; Notable for the following:       Result Value   RBC 3.77 (*)    Hemoglobin 9.5 (*)    HCT 30.1 (*)    MCH 25.2 (*)    RDW 18.9 (*)    All other components within normal limits  COMPREHENSIVE METABOLIC PANEL - Abnormal; Notable for the following:    Sodium 134 (*)    Chloride 100 (*)    Glucose, Bld 107 (*)    AST 12 (*)    ALT 12 (*)    All other components within normal limits  D-DIMER, QUANTITATIVE (NOT AT [facility]) - Abnormal; Notable for the following:    D-Dimer, Quant 2.71 (*)    All other components within normal limits  APTT - Abnormal; Notable for the following:    aPTT 38 (*)    All other components within normal limits  TROPONIN I  PROTIME-INR  HEPARIN LEVEL (UNFRACTIONATED)    EKG  EKG Interpretation  Date/Time:  Tuesday November 19 2016 08:07:32 EST Ventricular Rate:  92 PR Interval:    QRS Duration: 78 QT Interval:  351 QTC Calculation: 435 R Axis:   47 Text Interpretation:  Sinus rhythm Confirmed by Lacinda Axon  MD, BRIAN ([id number]) on 11/19/2016 10:07:17 AM       Radiology Dg Chest 2 View  Result Date: 11/19/2016 CLINICAL DATA:  Left side chest pain. EXAM: CHEST  2 VIEW COMPARISON:  04/02/2016 FINDINGS: Airspace opacity noted at the left base concerning for pneumonia.  Right lung is clear. Heart is normal size. No effusions or acute bony abnormality. IMPRESSION: Left basilar opacity concerning for pneumonia. Electronically Signed   By: Lennette Bihari  [place] M.D.   On: 11/19/2016 09:17   Ct Angio Chest Pe W And/or Wo Contrast  Result Date: 11/19/2016 CLINICAL DATA:  46 year old female status post cervical spine she surgery 6 days ago with left anterior chest and shoulder pain. Hemoptysis for 3 days. Initial encounter. EXAM: CT ANGIOGRAPHY CHEST WITH CONTRAST TECHNIQUE: Multidetector CT imaging of the chest was performed using the standard protocol during bolus administration of intravenous contrast. Multiplanar CT image reconstructions and MIPs were obtained to evaluate the vascular anatomy. CONTRAST:  100 mL Isovue 370 COMPARISON:  Chest radiographs 0855 hours today. [facility] Chest CTA 06/21/2013 FINDINGS: Cardiovascular: Good contrast bolus timing in the pulmonary arterial tree. Positive for pulmonary emboli. Left lingula and possibly also anterior basal segment occlusive pulmonary thrombus. No left upper lobe involvement identified. No central or saddle embolus. No right lung involvement identified. The interventricular septum has a normal configuration without CT evidence of right heart strain. RV / LV ratio = 0.7. No pericardial effusion. Negative visualized aorta. Mediastinum/Nodes: No mediastinal lymphadenopathy. Lungs/Pleura: Major airways are patent. Small layering left pleural effusion. Confluent ground-glass opacity in the posterolateral lingula is observed by the most conspicuous area of occlusive thrombus and is compatible with pulmonary infarct (series 9, image 61). There is otherwise dependent opacity in the lower lobe which may simply reflect atelectasis. In the contralateral right lung there is mild dependent atelectasis. Upper Abdomen: Negative visualized liver, spleen, pancreas, left adrenal gland, left kidney, and bowel in the upper abdomen. Other  findings: Mild postoperative prevertebral fluid or inflammation at the cervicothoracic junction. Visualized thoracic inlet otherwise within normal limits. Musculoskeletal: Sequelae of lower cervical ACDF to the C7 level. Visible hardware and cervical vertebrae appear intact. Minimal thoracic scoliosis. No acute thoracic osseous abnormality. Review of the MIP images confirms the above findings. IMPRESSION: 1. Positive for acute pulmonary embolus with lingula involvement. Lingula pulmonary infarct. Small layering left pleural effusion. No central thrombus and no CTA evidence of right heart strain. 2. Critical Value/emergent results were called by telephone at the time of interpretation on 11/19/2016 at 11:20 am to Dr. Nat Christen , who verbally acknowledged these results. Electronically Signed   By: Genevie Ann M.D.   On: 11/19/2016 11:20    Procedures Procedures (including critical care time)  Medications Ordered in ED Medications  heparin ADULT infusion 100 units/mL (25000 units/251mL sodium chloride 0.45%) (1,100 Units/hr Intravenous New Bag/Given 11/19/16 1234)  fentaNYL (SUBLIMAZE) injection 50 mcg (50 mcg Intravenous Given 11/19/16 0853)  ondansetron (ZOFRAN) injection 4 mg (4 mg Intravenous Given 11/19/16 0853)  sodium chloride 0.9 % bolus 1,000 mL (0 mLs Intravenous Stopped 11/19/16 1044)  fentaNYL (SUBLIMAZE) injection 50 mcg (50 mcg Intravenous Given 11/19/16 1044)  ondansetron (ZOFRAN) injection 4 mg (4 mg Intravenous Given 11/19/16 1045)  iopamidol (ISOVUE-370) 76 % injection 100 mL (100 mLs Intravenous Contrast Given 11/19/16 1055)  ondansetron (ZOFRAN) injection 4 mg (4 mg Intravenous Given 11/19/16 1224)  fentaNYL (SUBLIMAZE) injection 50 mcg (50 mcg Intravenous Given 11/19/16 1224)  heparin bolus via infusion 4,000 Units (4,000 Units Intravenous Bolus from Bag 11/19/16 1233)     Initial Impression / Assessment and Plan / ED Course  I have reviewed the triage vital signs and the nursing notes.  Pertinent  labs & imaging results that were available during my care of the patient were reviewed by me and considered in my medical decision making (see chart for details).   CRITICAL CARE Performed by: Nat Christen Total critical care time: 35 minutes Critical  care time was exclusive of separately billable procedures and treating other patients. Critical care was necessary to treat or prevent imminent or life-threatening deterioration. Critical care was time spent personally by me on the following activities: development of treatment plan with patient and/or surrogate as well as nursing, discussions with consultants, evaluation of patient's response to treatment, examination of patient, obtaining history from patient or surrogate, ordering and performing treatments and interventions, ordering and review of laboratory studies, ordering and review of radiographic studies, pulse oximetry and re-evaluation of patient's condition. Clinical Course     Patient is status post C5-6 and C6-7 anterior cervical discectomy, fusion, graft, plating and is hemodynamically stable. CT angiogram of chest reveals a lingular pulmonary embolism.  Heparin initiated. Admit to general medicine.  Final Clinical Impressions(s) / ED Diagnoses   Final diagnoses:  Other acute pulmonary embolism without acute cor pulmonale (HCC)    New Prescriptions New Prescriptions   No medications on file   I personally performed the services described in this documentation, which was scribed in my presence. The recorded information has been reviewed and is accurate.      Nat Christen, MD 11/19/16 1336

## 2016-11-19 NOTE — ED Notes (Signed)
cbg of 118. 

## 2016-11-19 NOTE — ED Notes (Signed)
edp aware pt patients increasing pain, and pain to chest"ribs and left arm"

## 2016-11-19 NOTE — Telephone Encounter (Signed)
Previous note is to say 11/19/16*

## 2016-11-19 NOTE — Progress Notes (Signed)
    Dr Lorin Mercy aware of pt arrival at [facility]. No change to current plan. Formal eval in am.   Linna Darner, MD [place] 11/19/2016, 9:15 PM

## 2016-11-19 NOTE — ED Triage Notes (Signed)
Pt had neck surgery 6 days ago. Started having SOB 3 days ago and oxycodone rx not helping with pain. A/o. Mild labored breathing noted. Able to finish sentences

## 2016-11-20 ENCOUNTER — Encounter (HOSPITAL_COMMUNITY): Payer: Self-pay | Admitting: General Practice

## 2016-11-20 ENCOUNTER — Inpatient Hospital Stay (HOSPITAL_COMMUNITY): Payer: BLUE CROSS/BLUE SHIELD

## 2016-11-20 DIAGNOSIS — E119 Type 2 diabetes mellitus without complications: Secondary | ICD-10-CM

## 2016-11-20 DIAGNOSIS — I2699 Other pulmonary embolism without acute cor pulmonale: Secondary | ICD-10-CM

## 2016-11-20 LAB — BASIC METABOLIC PANEL
Anion gap: 9 (ref 5–15)
BUN: 8 mg/dL (ref 6–20)
CO2: 25 mmol/L (ref 22–32)
Calcium: 8.7 mg/dL — ABNORMAL LOW (ref 8.9–10.3)
Chloride: 101 mmol/L (ref 101–111)
Creatinine, Ser: 0.78 mg/dL (ref 0.44–1.00)
GFR calc Af Amer: >60 mL/min (ref >60)
GFR calc non Af Amer: >60 mL/min (ref >60)
Glucose, Bld: 115 mg/dL — ABNORMAL HIGH (ref 65–99)
Potassium: 3.8 mmol/L (ref 3.5–5.1)
Sodium: 135 mmol/L (ref 135–145)

## 2016-11-20 LAB — MRSA PCR SCREENING: MRSA by PCR: NEGATIVE

## 2016-11-20 LAB — CBC
HCT: 27.7 % — ABNORMAL LOW (ref 36.0–46.0)
Hemoglobin: 8.6 g/dL — ABNORMAL LOW (ref 12.0–15.0)
MCH: 24.2 pg — ABNORMAL LOW (ref 26.0–34.0)
MCHC: 31 g/dL (ref 30.0–36.0)
MCV: 77.8 fL — ABNORMAL LOW (ref 78.0–100.0)
Platelets: 273 10*3/uL (ref 150–400)
RBC: 3.56 MIL/uL — ABNORMAL LOW (ref 3.87–5.11)
RDW: 19 % — ABNORMAL HIGH (ref 11.5–15.5)
WBC: 6.4 10*3/uL (ref 4.0–10.5)

## 2016-11-20 LAB — GLUCOSE, CAPILLARY
Glucose-Capillary: 87 mg/dL (ref 65–99)
Glucose-Capillary: 84 mg/dL (ref 65–99)

## 2016-11-20 LAB — HEPARIN LEVEL (UNFRACTIONATED): Heparin Unfractionated: 0.54 IU/mL (ref 0.30–0.70)

## 2016-11-20 MED ORDER — IPRATROPIUM BROMIDE 0.06 % NA SOLN
2.0000 | Freq: Two times a day (BID) | NASAL | Status: DC
  Administered 2016-11-20 – 2016-11-22 (×4): 2 via NASAL
  Filled 2016-11-20: qty 15

## 2016-11-20 MED ORDER — OXYCODONE-ACETAMINOPHEN 5-325 MG PO TABS
1.0000 | ORAL_TABLET | ORAL | Status: DC | PRN
  Administered 2016-11-20 – 2016-11-21 (×5): 2 via ORAL
  Administered 2016-11-21: 1 via ORAL
  Administered 2016-11-21 – 2016-11-22 (×4): 2 via ORAL
  Filled 2016-11-20: qty 2
  Filled 2016-11-20: qty 1
  Filled 2016-11-20 (×9): qty 2

## 2016-11-20 MED ORDER — OXYCODONE-ACETAMINOPHEN 5-325 MG PO TABS
2.0000 | ORAL_TABLET | Freq: Once | ORAL | Status: AC
  Administered 2016-11-20: 2 via ORAL
  Filled 2016-11-20: qty 2

## 2016-11-20 MED ORDER — KETOROLAC TROMETHAMINE 30 MG/ML IJ SOLN
30.0000 mg | Freq: Once | INTRAMUSCULAR | Status: AC
  Administered 2016-11-20: 30 mg via INTRAVENOUS
  Filled 2016-11-20: qty 1

## 2016-11-20 MED ORDER — OLOPATADINE HCL 0.6 % NA SOLN: 2.0000 | Freq: Every day | NASAL | Status: DC

## 2016-11-20 NOTE — Progress Notes (Signed)
After being told that a left upper extremity ultrasound will be ordered the patient is insisting that she have a right arm ultrasound as well as a carotid ultrasound. Writer explained that the doctor needs to evaluate her to determine this. Right arm has no swelling. Dr. Waldron Labs notified via page. No new orders. He states he will speak with the patient in the morning and the only test that needs to be done is a left arm ultrasound. Will notify patient and continue to monitor. Wyonia Hough

## 2016-11-20 NOTE — Progress Notes (Signed)
Patient complained that oxygen via nasal canula was irritating her nose. Oxygen removed for several minutes while monitoring oxygen saturation level and it remained at 99-100%. Will keep patient off oxygen for now and continue to monitor. Jennifer Mullins

## 2016-11-20 NOTE — Progress Notes (Signed)
Patient ID: Jennifer Mullins, female   DOB: September 27, 1971, 46 y.o.   MRN: [id number] Pt seen and examined. Neck soft, neuro intact.  She complaints of some neck soreness since surgery last week. On anticoag. For PE,  Agree with 6 months Tx. Will follow  Thanks    [phone]

## 2016-11-20 NOTE — Progress Notes (Signed)
ANTICOAGULATION CONSULT NOTE - Follow Up Consult  Pharmacy Consult for PE Indication: pulmonary embolus  Allergies  Allergen Reactions  . Demerol [Meperidine] Shortness Of Breath    FLUSHING AND SHORTNESS OF BREATH  . Morphine And Related Shortness Of Breath    Flushed and hot hyper  . Diltiazem Hcl Hives    Patient Measurements: Height: 5' 1.5" (156.2 cm) Weight: 142 lb (64.4 kg) IBW/kg (Calculated) : 48.95 Heparin Dosing Weight: 62 kg  Vital Signs: Temp: 98.3 F (36.8 C) (01/02 2102) Temp Source: Oral (01/02 2102) BP: 140/79 (01/02 2102) Pulse Rate: 98 (01/02 2102)  Labs:  Recent Labs  11/19/16 0851 11/19/16 1214 11/19/16 1834 11/20/16 0256  HGB 9.5*  --   --  8.6*  HCT 30.1*  --   --  27.7*  PLT 280  --   --  273  APTT  --  38*  --   --   LABPROT  --  12.9  --   --   INR  --  0.97  --   --   HEPARINUNFRC  --   --  0.31 0.54  CREATININE 0.58  --   --  0.78  TROPONINI <0.03  --   --   --     Estimated Creatinine Clearance: 75.8 mL/min (by C-G formula based on SCr of 0.78 mg/dL).   Assessment: 46 yo F on heparin for PE. Heparin level therapeutic (0.54) on gtt at 1200 units/hr. Hgb trending down to 8.6, plt stable. No bleeding noted.  Goal of Therapy:  Heparin level 0.3-0.7 units/ml Monitor platelets by anticoagulation protocol: Yes   Plan:  Continue heparin at 1200 units/hr Will f/u daily heparin level and CBC  Sherlon Handing, PharmD, BCPS Clinical pharmacist, pager [phone] 11/20/2016 3:53 AM

## 2016-11-20 NOTE — Progress Notes (Signed)
Patient complaining of left arm pain and it appears mildly swollen. Dr. Waldron Labs notified. Verbal orders for a venous ultrasound of the LUE. Will place order and continue to monitor. Wyonia Hough

## 2016-11-20 NOTE — Progress Notes (Signed)
Patient's CBG collected by PCT: 137. Did not load into computer. Reviewed on machine by Probation officer. Will give coverage accordingly. Jennifer Mullins

## 2016-11-20 NOTE — Progress Notes (Signed)
PROGRESS NOTE                                                                                                                                                                                                             Patient Demographics:    Jennifer Mullins, is a 46 y.o. female, DOB - 1971/04/05, [id number]  Admit date - 11/19/2016   Admitting Physician Thurnell Lose, MD  Outpatient Primary MD for the patient is Jennifer Mullins, [place]  LOS - 1  Outpatient Specialists: ortho Dr Lorin Mercy  Chief Complaint  Patient presents with  . Neck Pain  . Shortness of Breath       Brief Narrative   y.o. female, With history of hypertension, DM type II, COPD, C-spine DJD who had a C-spine surgery by Dr. Lorin Mercy at [facility] on 11/13/2017, she was discharged with a c-collar in good condition, she presents with SOB and CP, CTA chest significant for left lingular PE without hemodynamic compromise   Subjective:    [name] today has, No headache, No abdominal pain - No Nausea, Still complains of left pleuritic chest pain and shortness of breath  Assessment  & Plan :    Principal Problem:   Pulmonary emboli (HCC) Active Problems:   HTN (hypertension)   Diabetes (HCC)   GAD (generalized anxiety disorder)   GERD (gastroesophageal reflux disease)  Left lingular PE - Provoked, after neurosurgery, with anticoagulation for 6 month. - Lower extremity venous Doppler with no evidence of DVT - Heparin GTT, Will Be Transitioned in A.M. To [facility] as D/W Dr Lorin Mercy.  Recent C-spine surgery - seen by Dr. Lorin Mercy, continue with soft c-collar - Continue with when necessary pain medication  Hypertension - Acceptable, off medication  DM type II.  - Hold Glucophage, check A1c, sliding scale.   COPD.  - Stable, no wheezing, continue supportive care with nebulizer treatments and oxygen as needed.  Dyslipidemia.  - On statin continue.  GERD.  - On  PPI.    Code Status : Full  Family Communication  : none at bedside  Disposition Plan  : home in am  Consults  :  orth Dr Lorin Mercy  Procedures  : None  DVT Prophylaxis  : Heparin GTT  Lab Results  Component Value Date   PLT 273 11/20/2016    Antibiotics  :  Anti-infectives    None        Objective:   Vitals:   11/20/16 0354 11/20/16 0559 11/20/16 0901 11/20/16 1200  BP:  (!) 108/54 116/72   Pulse:  80    Resp:  17    Temp:  97.5 F (36.4 C)    TempSrc:  Oral    SpO2:  100%  100%  Weight: 65.3 kg (143 lb 15.4 oz)     Height:        Wt Readings from Last 3 Encounters:  11/20/16 65.3 kg (143 lb 15.4 oz)  11/13/16 64.4 kg (142 lb)  11/07/16 64.6 kg (142 lb 6.4 oz)     Intake/Output Summary (Last 24 hours) at 11/20/16 1257 Last data filed at 11/20/16 0612  Gross per 24 hour  Intake              203 ml  Output                0 ml  Net              203 ml     Physical Exam  Awake Alert, Oriented X 3, No new F.N deficits, Normal affect [place].AT,PERRAL Supple Neck,No JVD, No cervical lymphadenopathy appriciated.  Symmetrical Chest wall movement, Good air movement bilaterally, CTAB RRR,No Gallops,Rubs or new Murmurs, No Parasternal Heave +ve B.Sounds, Abd Soft, No tenderness, No rebound - guarding or rigidity. No Cyanosis, Clubbing or edema, No new Rash or bruise      Data Review:    CBC  Recent Labs Lab 11/14/16 0540 11/19/16 0851 11/20/16 0256  WBC 6.5 8.0 6.4  HGB 8.6* 9.5* 8.6*  HCT 27.7* 30.1* 27.7*  PLT 197 280 273  MCV 76.9* 79.8 77.8*  MCH 23.9* 25.2* 24.2*  MCHC 31.0 31.6 31.0  RDW 19.0* 18.9* 19.0*  LYMPHSABS  --  1.0  --   MONOABS  --  0.8  --   EOSABS  --  0.0  --   BASOSABS  --  0.0  --     Chemistries   Recent Labs Lab 11/14/16 0540 11/19/16 0851 11/20/16 0256  NA 134* 134* 135  K 3.6 3.8 3.8  CL 104 100* 101  CO2 27 27 25   GLUCOSE 96 107* 115*  BUN 5* 8 8  CREATININE 0.59 0.58 0.78  CALCIUM 8.2* 9.4 8.7*   AST  --  12*  --   ALT  --  12*  --   ALKPHOS  --  60  --   BILITOT  --  0.3  --    ------------------------------------------------------------------------------------------------------------------ No results for input(s): CHOL, HDL, LDLCALC, TRIG, CHOLHDL, LDLDIRECT in the last 72 hours.  Lab Results  Component Value Date   HGBA1C 5.6 10/26/2015   ------------------------------------------------------------------------------------------------------------------ No results for input(s): TSH, T4TOTAL, T3FREE, THYROIDAB in the last 72 hours.  Invalid input(s): FREET3 ------------------------------------------------------------------------------------------------------------------ No results for input(s): VITAMINB12, FOLATE, FERRITIN, TIBC, IRON, RETICCTPCT in the last 72 hours.  Coagulation profile  Recent Labs Lab 11/19/16 1214  INR 0.97     Recent Labs  11/19/16 0851  DDIMER 2.71*    Cardiac Enzymes  Recent Labs Lab 11/19/16 0851  TROPONINI <0.03   ------------------------------------------------------------------------------------------------------------------ No results found for: BNP  Inpatient Medications  Scheduled Meds: . bisacodyl  5 mg Oral Daily  . cyproheptadine  4 mg Oral QHS  . famotidine  20 mg Oral QHS  . fluticasone  2 spray Each Nare Daily  . furosemide  20  mg Oral Daily  . insulin aspart  0-5 Units Subcutaneous QHS  . insulin aspart  0-9 Units Subcutaneous TID WC  . latanoprost  1 drop Both Eyes QHS  . mometasone-formoterol  2 puff Inhalation BID  . montelukast  10 mg Oral QHS  . pantoprazole  40 mg Oral Daily  . pravastatin  40 mg Oral Daily   Continuous Infusions: . heparin 1,200 Units/hr (11/20/16 0246)   PRN Meds:.albuterol, ALPRAZolam, benzonatate, guaiFENesin-codeine, hydrALAZINE, hydrOXYzine, olopatadine, ondansetron **OR** ondansetron (ZOFRAN) IV, oxyCODONE-acetaminophen, polyethylene glycol, polyvinyl alcohol  Micro  Results Recent Results (from the past 240 hour(s))  MRSA PCR Screening     Status: None   Collection Time: 11/19/16  9:44 PM  Result Value Ref Range Status   MRSA by PCR NEGATIVE NEGATIVE Final    Comment:        The GeneXpert MRSA Assay (FDA approved for NASAL specimens only), is one component of a comprehensive MRSA colonization surveillance program. It is not intended to diagnose MRSA infection nor to guide or monitor treatment for MRSA infections.     Radiology Reports Dg Chest 2 View  Result Date: 11/19/2016 CLINICAL DATA:  Left side chest pain. EXAM: CHEST  2 VIEW COMPARISON:  04/02/2016 FINDINGS: Airspace opacity noted at the left base concerning for pneumonia. Right lung is clear. Heart is normal size. No effusions or acute bony abnormality. IMPRESSION: Left basilar opacity concerning for pneumonia. Electronically Signed   By: Rolm Baptise M.D.   On: 11/19/2016 09:17   Dg Cervical Spine 2-3 Views  Result Date: 11/13/2016 CLINICAL DATA:  Anterior fusion at C5-6 and C6-7 EXAM: CERVICAL SPINE - 2-3 VIEW COMPARISON:  MR C-spine of 10/09/2015 FINDINGS: Two C-arm spot films show anterior fusion at C5-6 and [id number] with no complicating features noted. Interbody fusion plugs at C5-6 and C6-7 appear to be in good position with normal alignment maintained. IMPRESSION: Anterior fusion at C5-6 and C6-7.  No complicating features. Electronically Signed   By: Ivar Drape M.D.   On: 11/13/2016 15:51   Ct Angio Chest Pe W And/or Wo Contrast  Result Date: 11/19/2016 CLINICAL DATA:  46 year old female status post cervical spine she surgery 6 days ago with left anterior chest and shoulder pain. Hemoptysis for 3 days. Initial encounter. EXAM: CT ANGIOGRAPHY CHEST WITH CONTRAST TECHNIQUE: Multidetector CT imaging of the chest was performed using the standard protocol during bolus administration of intravenous contrast. Multiplanar CT image reconstructions and MIPs were obtained to evaluate the vascular  anatomy. CONTRAST:  100 mL Isovue 370 COMPARISON:  Chest radiographs 0855 hours today. [facility] Chest CTA 06/21/2013 FINDINGS: Cardiovascular: Good contrast bolus timing in the pulmonary arterial tree. Positive for pulmonary emboli. Left lingula and possibly also anterior basal segment occlusive pulmonary thrombus. No left upper lobe involvement identified. No central or saddle embolus. No right lung involvement identified. The interventricular septum has a normal configuration without CT evidence of right heart strain. RV / LV ratio = 0.7. No pericardial effusion. Negative visualized aorta. Mediastinum/Nodes: No mediastinal lymphadenopathy. Lungs/Pleura: Major airways are patent. Small layering left pleural effusion. Confluent ground-glass opacity in the posterolateral lingula is observed by the most conspicuous area of occlusive thrombus and is compatible with pulmonary infarct (series 9, image 61). There is otherwise dependent opacity in the lower lobe which may simply reflect atelectasis. In the contralateral right lung there is mild dependent atelectasis. Upper Abdomen: Negative visualized liver, spleen, pancreas, left adrenal gland, left kidney, and bowel in the upper abdomen.  Other findings: Mild postoperative prevertebral fluid or inflammation at the cervicothoracic junction. Visualized thoracic inlet otherwise within normal limits. Musculoskeletal: Sequelae of lower cervical ACDF to the C7 level. Visible hardware and cervical vertebrae appear intact. Minimal thoracic scoliosis. No acute thoracic osseous abnormality. Review of the MIP images confirms the above findings. IMPRESSION: 1. Positive for acute pulmonary embolus with lingula involvement. Lingula pulmonary infarct. Small layering left pleural effusion. No central thrombus and no CTA evidence of right heart strain. 2. Critical Value/emergent results were called by telephone at the time of interpretation on 11/19/2016 at 11:20 am to Dr.  Nat Christen , who verbally acknowledged these results. Electronically Signed   By: Genevie Ann M.D.   On: 11/19/2016 11:20   Dg C-arm 1-60 Min  Result Date: 11/13/2016 CLINICAL DATA:  Anterior fusion at C5-6 and C6-7 EXAM: DG C-ARM 61-120 MIN COMPARISON:  MR C-spine of 10/09/2015 FINDINGS: C-arm fluoroscopy was provided during anterior fusion. 9 seconds of fluoroscopy time was recorded. IMPRESSION: C-arm fluoroscopy provided. Electronically Signed   By: Ivar Drape M.D.   On: 11/13/2016 15:51       ELGERGAWY, DAWOOD M.D on 11/20/2016 at 12:57 PM  Between 7am to 7pm - Pager - [phone]  After 7pm go to [email or web address] - password [facility]  [facility] -  Office  [phone]

## 2016-11-20 NOTE — Progress Notes (Signed)
**  Preliminary report by tech**  Bilateral lower extremity venous duplex completed. There is no evidence of deep or superficial vein thrombosis involving the right and left lower extremities. All visualized vessels appear patent and compressible. There is no evidence of Baker's cysts bilaterally.  11/20/16 11:06 AM Jennifer Mullins RVT

## 2016-11-21 ENCOUNTER — Telehealth: Payer: Self-pay | Admitting: Nurse Practitioner

## 2016-11-21 ENCOUNTER — Inpatient Hospital Stay (HOSPITAL_COMMUNITY): Payer: BLUE CROSS/BLUE SHIELD

## 2016-11-21 ENCOUNTER — Inpatient Hospital Stay (INDEPENDENT_AMBULATORY_CARE_PROVIDER_SITE_OTHER): Payer: BLUE CROSS/BLUE SHIELD | Admitting: Orthopaedic Surgery

## 2016-11-21 DIAGNOSIS — M7989 Other specified soft tissue disorders: Secondary | ICD-10-CM

## 2016-11-21 DIAGNOSIS — M79609 Pain in unspecified limb: Secondary | ICD-10-CM

## 2016-11-21 LAB — CBC
HCT: 27.1 % — ABNORMAL LOW (ref 36.0–46.0)
Hemoglobin: 8.3 g/dL — ABNORMAL LOW (ref 12.0–15.0)
MCH: 24.5 pg — ABNORMAL LOW (ref 26.0–34.0)
MCHC: 30.6 g/dL (ref 30.0–36.0)
MCV: 79.9 fL (ref 78.0–100.0)
Platelets: 278 10*3/uL (ref 150–400)
RBC: 3.39 MIL/uL — ABNORMAL LOW (ref 3.87–5.11)
RDW: 19.2 % — ABNORMAL HIGH (ref 11.5–15.5)
WBC: 6 10*3/uL (ref 4.0–10.5)

## 2016-11-21 LAB — GLUCOSE, CAPILLARY
Glucose-Capillary: 137 mg/dL — ABNORMAL HIGH (ref 65–99)
Glucose-Capillary: 102 mg/dL — ABNORMAL HIGH (ref 65–99)
Glucose-Capillary: 97 mg/dL (ref 65–99)
Glucose-Capillary: 91 mg/dL (ref 65–99)
Glucose-Capillary: 107 mg/dL — ABNORMAL HIGH (ref 65–99)

## 2016-11-21 LAB — HEMOGLOBIN A1C
Hgb A1c MFr Bld: 5.4 % (ref 4.8–5.6)
Mean Plasma Glucose: 108 mg/dL

## 2016-11-21 LAB — BASIC METABOLIC PANEL
Anion gap: 8 (ref 5–15)
BUN: 9 mg/dL (ref 6–20)
CO2: 24 mmol/L (ref 22–32)
Calcium: 9 mg/dL (ref 8.9–10.3)
Chloride: 104 mmol/L (ref 101–111)
Creatinine, Ser: 0.6 mg/dL (ref 0.44–1.00)
GFR calc Af Amer: >60 mL/min (ref >60)
GFR calc non Af Amer: >60 mL/min (ref >60)
Glucose, Bld: 125 mg/dL — ABNORMAL HIGH (ref 65–99)
Potassium: 4.1 mmol/L (ref 3.5–5.1)
Sodium: 136 mmol/L (ref 135–145)

## 2016-11-21 LAB — HEPARIN LEVEL (UNFRACTIONATED): Heparin Unfractionated: 0.57 IU/mL (ref 0.30–0.70)

## 2016-11-21 MED ORDER — MAGNESIUM CITRATE PO SOLN
1.0000 | Freq: Once | ORAL | Status: AC
  Administered 2016-11-21: 1 via ORAL
  Filled 2016-11-21: qty 296

## 2016-11-21 MED ORDER — FERROUS SULFATE 325 (65 FE) MG PO TABS
325.0000 mg | ORAL_TABLET | Freq: Two times a day (BID) | ORAL | Status: DC
  Administered 2016-11-21 – 2016-11-22 (×2): 325 mg via ORAL
  Filled 2016-11-21 (×2): qty 1

## 2016-11-21 MED ORDER — SALINE SPRAY 0.65 % NA SOLN
1.0000 | NASAL | Status: DC | PRN
  Administered 2016-11-21 – 2016-11-22 (×2): 1 via NASAL
  Filled 2016-11-21: qty 44

## 2016-11-21 MED ORDER — APIXABAN 5 MG PO TABS
10.0000 mg | ORAL_TABLET | Freq: Two times a day (BID) | ORAL | Status: DC
  Administered 2016-11-21 – 2016-11-22 (×3): 10 mg via ORAL
  Filled 2016-11-21 (×3): qty 2

## 2016-11-21 MED ORDER — APIXABAN (ELIQUIS) EDUCATION KIT FOR DVT/PE PATIENTS
PACK | Freq: Once | Status: AC
  Administered 2016-11-21: 12:00:00
  Filled 2016-11-21: qty 1

## 2016-11-21 MED ORDER — APIXABAN 5 MG PO TABS: 5.0000 mg | ORAL_TABLET | Freq: Two times a day (BID) | ORAL | Status: DC

## 2016-11-21 NOTE — Progress Notes (Addendum)
Preliminary results by tech - Venous Duplex Left Upper Ext. Completed. Negative for deep vein thrombosis in the left arm. Positive for indeterminate age thrombosis involving a short segment of the basilic vein above the IV site/antecubital area.  Oda Cogan, BS, RDMS, RVT

## 2016-11-21 NOTE — Telephone Encounter (Signed)
FYI

## 2016-11-21 NOTE — Progress Notes (Signed)
Patient ambulated approximately 30 feet on room air and oxygen saturation remained between 97-100%. She complained of some pain and SOB but stated it was better than this morning. Will leave on room air and continue to monitor. Jennifer Mullins

## 2016-11-21 NOTE — Progress Notes (Addendum)
PROGRESS NOTE                                                                                                                                                                                                             Patient Demographics:    Jennifer Mullins, is a 46 y.o. female, DOB - 1970-12-25, RL:[id number]  Admit date - 11/19/2016   Admitting Physician Thurnell Lose, MD  Outpatient Primary MD for the patient is Jennifer Mullins, [place]  LOS - 2  Outpatient Specialists: ortho Dr Lorin Mercy  Chief Complaint  Patient presents with  . Neck Pain  . Shortness of Breath       Brief Narrative   y.o. female, With history of hypertension, DM type II, COPD, C-spine DJD who had a C-spine surgery by Dr. Lorin Mercy at [facility] on 11/13/2017, she was discharged with a c-collar in good condition, she presents with SOB and CP, CTA chest significant for left lingular PE without hemodynamic compromise   Subjective:    [name] today has, No headache, No abdominal pain - No Nausea, Still complains of left pleuritic chest pain and shortness of breath  Assessment  & Plan :    Principal Problem:   Pulmonary emboli (HCC) Active Problems:   HTN (hypertension)   Diabetes (HCC)   GAD (generalized anxiety disorder)   GERD (gastroesophageal reflux disease)  Left lingular PE - Provoked, after neurosurgery, with anticoagulation for 6 month. - Lower extremity venous Doppler with no evidence of DVT, left upper extremity with no evidence of deep DVT, only indeterminate age thrombosis in basilic vein - Heparin GTT, Will transition to Eliquis today..  Recent C-spine surgery - seen by Dr. Lorin Mercy, continue with soft c-collar - Continue with when necessary pain medication  Hypertension - Acceptable, off medication, She reports cough to lisinopril, consider losartan  DM type II.  - Hold Glucophage, check A1c, sliding scale.   COPD.  - Stable, no wheezing,  continue supportive care with nebulizer treatments and oxygen as needed.  Dyslipidemia.  - On statin continue.  GERD.  - On PPI.    Code Status : Full  Family Communication  : none at bedside  Disposition Plan  : home in am  Consults  :  orth Dr Lorin Mercy  Procedures  : None  DVT Prophylaxis  : Eliquis  Lab Results  Component Value Date   PLT 278 11/21/2016    Antibiotics  :    Anti-infectives    None        Objective:   Vitals:   11/20/16 2315 11/21/16 0532 11/21/16 0841 11/21/16 0949  BP: (!) 147/92 135/69 126/86   Pulse: 60 97    Resp: 18 17    Temp: 99.1 F (37.3 C) 99.1 F (37.3 C) 98.9 F (37.2 C)   TempSrc: Oral Oral    SpO2: 96% 100%  97%  Weight:      Height:        Wt Readings from Last 3 Encounters:  11/20/16 65.3 kg (143 lb 15.4 oz)  11/13/16 64.4 kg (142 lb)  11/07/16 64.6 kg (142 lb 6.4 oz)     Intake/Output Summary (Last 24 hours) at 11/21/16 1358 Last data filed at 11/21/16 0641  Gross per 24 hour  Intake            293.8 ml  Output                0 ml  Net            293.8 ml     Physical Exam  Awake Alert, Oriented X 3, No new F.N deficits, Normal affect [name].AT,PERRAL Supple Neck,No JVD, No cervical lymphadenopathy appriciated.  Symmetrical Chest wall movement, Good air movement bilaterally, CTAB RRR,No Gallops,Rubs or new Murmurs, No Parasternal Heave +ve B.Sounds, Abd Soft, No tenderness, No rebound - guarding or rigidity. No Cyanosis, Clubbing or edema, No new Rash or bruise      Data Review:    CBC  Recent Labs Lab 11/19/16 0851 11/20/16 0256 11/21/16 0719  WBC 8.0 6.4 6.0  HGB 9.5* 8.6* 8.3*  HCT 30.1* 27.7* 27.1*  PLT 280 273 278  MCV 79.8 77.8* 79.9  MCH 25.2* 24.2* 24.5*  MCHC 31.6 31.0 30.6  RDW 18.9* 19.0* 19.2*  LYMPHSABS 1.0  --   --   MONOABS 0.8  --   --   EOSABS 0.0  --   --   BASOSABS 0.0  --   --     Chemistries   Recent Labs Lab 11/19/16 0851 11/20/16 0256 11/21/16 0719  NA  134* 135 136  K 3.8 3.8 4.1  CL 100* 101 104  CO2 27 25 24   GLUCOSE 107* 115* 125*  BUN 8 8 9   CREATININE 0.58 0.78 0.60  CALCIUM 9.4 8.7* 9.0  AST 12*  --   --   ALT 12*  --   --   ALKPHOS 60  --   --   BILITOT 0.3  --   --    ------------------------------------------------------------------------------------------------------------------ No results for input(s): CHOL, HDL, LDLCALC, TRIG, CHOLHDL, LDLDIRECT in the last 72 hours.  Lab Results  Component Value Date   HGBA1C 5.4 11/20/2016   ------------------------------------------------------------------------------------------------------------------ No results for input(s): TSH, T4TOTAL, T3FREE, THYROIDAB in the last 72 hours.  Invalid input(s): FREET3 ------------------------------------------------------------------------------------------------------------------ No results for input(s): VITAMINB12, FOLATE, FERRITIN, TIBC, IRON, RETICCTPCT in the last 72 hours.  Coagulation profile  Recent Labs Lab 11/19/16 1214  INR 0.97     Recent Labs  11/19/16 0851  DDIMER 2.71*    Cardiac Enzymes  Recent Labs Lab 11/19/16 0851  TROPONINI <0.03   ------------------------------------------------------------------------------------------------------------------ No results found for: BNP  Inpatient Medications  Scheduled Meds: . apixaban  10 mg Oral BID   Followed by  . [START ON 11/28/2016] apixaban  5 mg  Oral BID  . bisacodyl  5 mg Oral Daily  . cyproheptadine  4 mg Oral QHS  . famotidine  20 mg Oral QHS  . ferrous sulfate  325 mg Oral BID WC  . fluticasone  2 spray Each Nare Daily  . furosemide  20 mg Oral Daily  . insulin aspart  0-5 Units Subcutaneous QHS  . insulin aspart  0-9 Units Subcutaneous TID WC  . ipratropium  2 spray Each Nare BID  . latanoprost  1 drop Both Eyes QHS  . mometasone-formoterol  2 puff Inhalation BID  . montelukast  10 mg Oral QHS  . Olopatadine HCl  2 puff Nasal QHS  .  pantoprazole  40 mg Oral Daily  . pravastatin  40 mg Oral Daily   Continuous Infusions:  PRN Meds:.albuterol, ALPRAZolam, benzonatate, guaiFENesin-codeine, hydrALAZINE, hydrOXYzine, ondansetron **OR** ondansetron (ZOFRAN) IV, oxyCODONE-acetaminophen, polyethylene glycol, polyvinyl alcohol  Micro Results Recent Results (from the past 240 hour(s))  MRSA PCR Screening     Status: None   Collection Time: 11/19/16  9:44 PM  Result Value Ref Range Status   MRSA by PCR NEGATIVE NEGATIVE Final    Comment:        The GeneXpert MRSA Assay (FDA approved for NASAL specimens only), is one component of a comprehensive MRSA colonization surveillance program. It is not intended to diagnose MRSA infection nor to guide or monitor treatment for MRSA infections.     Radiology Reports Dg Chest 2 View  Result Date: 11/19/2016 CLINICAL DATA:  Left side chest pain. EXAM: CHEST  2 VIEW COMPARISON:  04/02/2016 FINDINGS: Airspace opacity noted at the left base concerning for pneumonia. Right lung is clear. Heart is normal size. No effusions or acute bony abnormality. IMPRESSION: Left basilar opacity concerning for pneumonia. Electronically Signed   By: Rolm Baptise M.D.   On: 11/19/2016 09:17   Dg Cervical Spine 2-3 Views  Result Date: 11/13/2016 CLINICAL DATA:  Anterior fusion at C5-6 and C6-7 EXAM: CERVICAL SPINE - 2-3 VIEW COMPARISON:  MR C-spine of 10/09/2015 FINDINGS: Two C-arm spot films show anterior fusion at C5-6 and [id number] with no complicating features noted. Interbody fusion plugs at C5-6 and C6-7 appear to be in good position with normal alignment maintained. IMPRESSION: Anterior fusion at C5-6 and C6-7.  No complicating features. Electronically Signed   By: Ivar Drape M.D.   On: 11/13/2016 15:51   Ct Angio Chest Pe W And/or Wo Contrast  Result Date: 11/19/2016 CLINICAL DATA:  46 year old female status post cervical spine she surgery 6 days ago with left anterior chest and shoulder pain.  Hemoptysis for 3 days. Initial encounter. EXAM: CT ANGIOGRAPHY CHEST WITH CONTRAST TECHNIQUE: Multidetector CT imaging of the chest was performed using the standard protocol during bolus administration of intravenous contrast. Multiplanar CT image reconstructions and MIPs were obtained to evaluate the vascular anatomy. CONTRAST:  100 mL Isovue 370 COMPARISON:  Chest radiographs 0855 hours today. [facility] Chest CTA 06/21/2013 FINDINGS: Cardiovascular: Good contrast bolus timing in the pulmonary arterial tree. Positive for pulmonary emboli. Left lingula and possibly also anterior basal segment occlusive pulmonary thrombus. No left upper lobe involvement identified. No central or saddle embolus. No right lung involvement identified. The interventricular septum has a normal configuration without CT evidence of right heart strain. RV / LV ratio = 0.7. No pericardial effusion. Negative visualized aorta. Mediastinum/Nodes: No mediastinal lymphadenopathy. Lungs/Pleura: Major airways are patent. Small layering left pleural effusion. Confluent ground-glass opacity in the posterolateral lingula is observed  by the most conspicuous area of occlusive thrombus and is compatible with pulmonary infarct (series 9, image 61). There is otherwise dependent opacity in the lower lobe which may simply reflect atelectasis. In the contralateral right lung there is mild dependent atelectasis. Upper Abdomen: Negative visualized liver, spleen, pancreas, left adrenal gland, left kidney, and bowel in the upper abdomen. Other findings: Mild postoperative prevertebral fluid or inflammation at the cervicothoracic junction. Visualized thoracic inlet otherwise within normal limits. Musculoskeletal: Sequelae of lower cervical ACDF to the C7 level. Visible hardware and cervical vertebrae appear intact. Minimal thoracic scoliosis. No acute thoracic osseous abnormality. Review of the MIP images confirms the above findings. IMPRESSION: 1.  Positive for acute pulmonary embolus with lingula involvement. Lingula pulmonary infarct. Small layering left pleural effusion. No central thrombus and no CTA evidence of right heart strain. 2. Critical Value/emergent results were called by telephone at the time of interpretation on 11/19/2016 at 11:20 am to Dr. Nat Christen , who verbally acknowledged these results. Electronically Signed   By: Genevie Ann M.D.   On: 11/19/2016 11:20   Dg C-arm 1-60 Min  Result Date: 11/13/2016 CLINICAL DATA:  Anterior fusion at C5-6 and C6-7 EXAM: DG C-ARM 61-120 MIN COMPARISON:  MR C-spine of 10/09/2015 FINDINGS: C-arm fluoroscopy was provided during anterior fusion. 9 seconds of fluoroscopy time was recorded. IMPRESSION: C-arm fluoroscopy provided. Electronically Signed   By: Ivar Drape M.D.   On: 11/13/2016 15:51       ELGERGAWY, DAWOOD M.D on 11/21/2016 at 1:58 PM  Between 7am to 7pm - Pager - [phone]  After 7pm go to [email or web address] - password [email or web address]  [facility] -  Office  [phone]

## 2016-11-21 NOTE — Care Management Note (Signed)
Case Management Note  Patient Details  Name: Jennifer Mullins MRN: [id number] Date of Birth: 07-21-1971  Subjective/Objective:                Patient admitted from home for PE. Will DC on Eliquis. Pt provided with 30 day free card and $10 monthly copay card with starter kit by pharmacist 11/21/16. Patient ambulating on RA with PT at 97% as documented on VS flowsheet 11/21/16.    Action/Plan:  Anticipate DC to home with HH.  CM to continue to follow.  Expected Discharge Date:                  Expected Discharge Plan:  [place]  In-House Referral:     Discharge planning Services  CM Consult  Post Acute Care Choice:    Choice offered to:     DME Arranged:    DME Agency:     HH Arranged:    [place] Agency:     Status of Service:  In process, will continue to follow  If discussed at Long Length of Stay Meetings, dates discussed:    Additional Comments:  Carles Collet, RN 11/21/2016, 12:34 PM

## 2016-11-21 NOTE — Evaluation (Signed)
Occupational Therapy Evaluation Patient Details Name: Jennifer Mullins MRN: [id number] DOB: Dec 21, 1970 Today's Date: 11/21/2016    History of Present Illness 46 y.o. female, With history of hypertension, DM type II, COPD, C-spine DJD who had a C-spine surgery by Dr. Lorin Mercy at [facility] on 11/13/2017, she was discharged with a c-collar in good condition, she presents with SOB and CP, CTA chest significant for left lingular PE without hemodynamic compromise   Clinical Impression   Pt reports she was independent with ADL prior to cervical sx ~1 week ago, since then she has required assist with ADL. Currently pt requires min assist for basic transfers and mod assist overall for ADL. Pt presenting with LUE pain, limited ROM, and increased edema impacting independence and safety with ADL. Pt planning to d/c home with 24/7 supervision from a friend. Recommending HHOT for follow up to maximize independence and safety with ADL and functional mobility upon return home. Pt would benefit from continued skilled OT to address established goals.    Follow Up Recommendations  Home health OT;Supervision/Assistance - 24 hour    Equipment Recommendations  3 in 1 bedside commode    Recommendations for Other Services       Precautions / Restrictions Precautions Precautions: Fall;Cervical Required Braces or Orthoses: Cervical Brace Cervical Brace: Soft collar Restrictions Weight Bearing Restrictions: No      Mobility Bed Mobility Overal bed mobility: Needs Assistance Bed Mobility: Supine to Sit;Sit to Supine     Supine to sit: Min assist;HOB elevated Sit to supine: Min guard;HOB elevated   General bed mobility comments: Light min assist for trunk elevation to sitting. Increased time required.  Transfers Overall transfer level: Needs assistance Equipment used: None Transfers: Sit to/from Stand Sit to Stand: Min guard Stand pivot transfers: Min assist       General transfer  comment: Sit to stand from EOB with min guard assist. Pt able to take small side steps toward [facility] with light min assist for steadying balance    Balance Overall balance assessment: Needs assistance Sitting-balance support: Feet supported;No upper extremity supported Sitting balance-Leahy Scale: Good     Standing balance support: No upper extremity supported Standing balance-Leahy Scale: Poor Standing balance comment: needs min A                            ADL Overall ADL's : Needs assistance/impaired Eating/Feeding: Set up;Sitting   Grooming: Minimal assistance;Sitting   Upper Body Bathing: Moderate assistance;Sitting   Lower Body Bathing: Moderate assistance;Sit to/from stand   Upper Body Dressing : Maximal assistance;Sitting   Lower Body Dressing: Moderate assistance;Sit to/from stand   Toilet Transfer: Minimal Production assistant, radio Details (indicate cue type and reason): Simulated by sit to stand from EOB with few side steps toward [facility]         Functional mobility during ADLs: Minimal assistance (for stand pivot only) General ADL Comments: Pt appears very anxious regarding movement and LUE pain. Educated on ROM, massage, and elevation of LUE for edema control. SpO2 in low 90s on RA throughout; pt requesting supplemental O2 reapplied.      Vision     Perception     Praxis      Pertinent Vitals/Pain Pain Assessment: Faces Faces Pain Scale: Hurts whole lot Pain Location: LUE Pain Descriptors / Indicators: Aching;Throbbing;Grimacing Pain Intervention(s): Limited activity within patient's tolerance;Monitored during session;Repositioned     Hand Dominance Right   Extremity/Trunk Assessment Upper  Extremity Assessment Upper Extremity Assessment: LUE deficits/detail LUE Deficits / Details: Limited ROM throughout due to pain and edema.   LUE: Unable to fully assess due to pain   Lower Extremity Assessment Lower Extremity Assessment:  Defer to PT evaluation   Cervical / Trunk Assessment Cervical / Trunk Assessment: Normal   Communication Communication Communication: No difficulties   Cognition Arousal/Alertness: Awake/alert Behavior During Therapy: Anxious Overall Cognitive Status: Within Functional Limits for tasks assessed                 General Comments: Pt seems very anxious throughout session   General Comments       Exercises       Shoulder Instructions      Home Living Family/patient expects to be discharged to:: Private residence Living Arrangements: Non-relatives/Friends Available Help at Discharge: Friend(s);Available 24 hours/day Type of Home: House       Home Layout: One level     Bathroom Shower/Tub: Teacher, early years/pre: Standard     Home Equipment: None   Additional Comments: Pt states she lives with a friend who is retired and is able to help      Prior Functioning/Environment Level of Independence: Independent        Comments: Has required assist with ADL since cervical sx ~1 week ago        OT Problem List: Decreased strength;Decreased range of motion;Decreased activity tolerance;Impaired balance (sitting and/or standing);Decreased knowledge of use of DME or AE;Impaired UE functional use;Pain;Increased edema   OT Treatment/Interventions: Self-care/ADL training;Therapeutic exercise;Energy conservation;DME and/or AE instruction;Therapeutic activities;Patient/family education;Balance training    OT Goals(Current goals can be found in the care plan section) Acute Rehab OT Goals Patient Stated Goal: feel better OT Goal Formulation: With patient Time For Goal Achievement: 12/05/16 Potential to Achieve Goals: Good ADL Goals Pt Will Perform Grooming: with supervision;standing Pt Will Perform Upper Body Bathing: with supervision;sitting Pt Will Perform Upper Body Dressing: with supervision;sitting Pt Will Transfer to Toilet: with  supervision;ambulating;bedside commode (over toilet) Pt Will Perform Toileting - Clothing Manipulation and hygiene: with supervision;sit to/from stand Additional ADL Goal #1: Pt will independently verbalize 3 edema management strategies.  OT Frequency: Min 2X/week   Barriers to D/C:            Co-evaluation              End of Session Equipment Utilized During Treatment: Oxygen  Activity Tolerance: Patient limited by pain Patient left: in bed;with call bell/phone within reach   Time: 0956-1020 OT Time Calculation (min): 24 min Charges:  OT General Charges $OT Visit: 1 Procedure OT Evaluation $OT Eval Moderate Complexity: 1 Procedure OT Treatments $Therapeutic Activity: 8-22 mins G-Codes:     Binnie Kand M.S., OTR/L Pager: [phone]  11/21/2016, 10:32 AM

## 2016-11-21 NOTE — Progress Notes (Addendum)
Patient ID: Jennifer Mullins, female   DOB: [birth year], 46 y.o.   MRN: [id number] Neck soft , no hematoma. Reflexes UE 2 plus and symmetrical. LE reflexes 3 plus and symmetrical. Good strength LE quads , ankle DF, PF. She has swelling in left arm and hand and pain with left shoulder ROM. Biceps triceps intact and strong.   Will order PT for ambulation. OT eval for hand swelling.  No findings to suggest cord compression. No change in neck. Ready to begin OOB and ambulation.

## 2016-11-21 NOTE — Evaluation (Signed)
Physical Therapy Evaluation Patient Details Name: Jennifer Mullins MRN: [id number] DOB: [birth year] Today's Date: 11/21/2016   History of Present Illness  46 y.o. female, With history of hypertension, DM type II, COPD, C-spine DJD who had a C-spine surgery by Dr. Lorin Mercy at [facility] on 11/13/2017, she was discharged with a c-collar in good condition, she presents with SOB and CP, CTA chest significant for left lingular PE without hemodynamic compromise  Clinical Impression  Pt presents with impairments in gait, balance and mobility as well as decreased activity tolerance. Pt will benefit from skilled PT services to address deficits and improve functional mobility    Follow Up Recommendations Home health PT    Equipment Recommendations   (TBD)    Recommendations for Other Services       Precautions / Restrictions Precautions Precautions: Fall;Cervical Required Braces or Orthoses: Cervical Brace Cervical Brace: Soft collar      Mobility  Bed Mobility Overal bed mobility: Modified Independent             General bed mobility comments: pt requires increased time and use of bed controls but is able to get in and out of bed without assistance  Transfers Overall transfer level: Needs assistance Equipment used: 1 person hand held assist Transfers: Sit to/from Stand;Stand Pivot Transfers Sit to Stand: Min assist Stand pivot transfers: Min assist       General transfer comment: pt requires assist for balance, very slow and methodical with movements due to c/o Lt side and UE pain  Ambulation/Gait Ambulation/Gait assistance: Min assist Ambulation Distance (Feet): 15 Feet Assistive device: None       General Gait Details: decreased step and stride length, requires frequent standing rests due to c/o Lt side pain, RN made aware of pain. No LOB during gait  Stairs            Wheelchair Mobility    Modified Rankin (Stroke Patients Only)       Balance  Overall balance assessment: Needs assistance         Standing balance support: No upper extremity supported;During functional activity Standing balance-Leahy Scale: Poor Standing balance comment: needs min A                             Pertinent Vitals/Pain Pain Assessment: Faces Faces Pain Scale: Hurts little more Pain Location: Lt UE and flank Pain Descriptors / Indicators: Aching;Throbbing Pain Intervention(s): Limited activity within patient's tolerance;Monitored during session;Patient requesting pain meds-RN notified;Repositioned    Home Living Family/patient expects to be discharged to:: Private residence Living Arrangements: Non-relatives/Friends Available Help at Discharge: Friend(s);Available 24 hours/day Type of Home: House       Home Layout: One level Home Equipment: None Additional Comments: Pt states she lives with a friend who is retired and is able to help    Prior Function Level of Independence: Independent               Hand Dominance        Extremity/Trunk Assessment   Upper Extremity Assessment Upper Extremity Assessment: LUE deficits/detail LUE Deficits / Details: decreased sensation and increased pain Lt UE, strength 2+/5    Lower Extremity Assessment Lower Extremity Assessment: Generalized weakness    Cervical / Trunk Assessment Cervical / Trunk Assessment: Normal (soft collar)  Communication   Communication: No difficulties  Cognition Arousal/Alertness: Awake/alert Behavior During Therapy: Anxious Overall Cognitive Status: Within Functional Limits for tasks assessed  General Comments: Pt very anxious throughout session    General Comments      Exercises     Assessment/Plan    PT Assessment Patient needs continued PT services  PT Problem List Decreased strength;Decreased balance;Decreased mobility;Decreased activity tolerance;Decreased knowledge of use of DME;Pain          PT Treatment  Interventions DME instruction;Functional mobility training;Balance training;Patient/family education;Modalities;Gait training;Therapeutic activities;Neuromuscular re-education;Manual techniques;Therapeutic exercise;Stair training    PT Goals (Current goals can be found in the Care Plan section)  Acute Rehab PT Goals Patient Stated Goal: feel better PT Goal Formulation: With patient Time For Goal Achievement: 12/05/16 Potential to Achieve Goals: Good    Frequency Min 3X/week   Barriers to discharge Decreased caregiver support      Co-evaluation               End of Session Equipment Utilized During Treatment: Cervical collar Activity Tolerance: Patient limited by fatigue;Patient limited by pain Patient left: in bed;with nursing/sitter in room;with call bell/phone within reach Nurse Communication: Mobility status;Patient requests pain meds         Time: [id number] PT Time Calculation (min) (ACUTE ONLY): 18 min   Charges:   PT Evaluation $PT Eval Moderate Complexity: 1 Procedure PT Treatments $Therapeutic Activity: 8-22 mins   PT G Codes:        DONAWERTH,KAREN 12-11-2016, 9:56 AM

## 2016-11-21 NOTE — Progress Notes (Addendum)
ANTICOAGULATION CONSULT NOTE - Follow Up Consult  Pharmacy Consult for PE  UFH>>Eliquis Indication: pulmonary embolus  Allergies  Allergen Reactions  . Demerol [Meperidine] Shortness Of Breath    FLUSHING AND SHORTNESS OF BREATH  . Morphine And Related Shortness Of Breath    Flushed and hot hyper  . Diltiazem Hcl Hives    Patient Measurements: Height: 5' 1.5" (156.2 cm) Weight: 143 lb 15.4 oz (65.3 kg) IBW/kg (Calculated) : 48.95 Heparin Dosing Weight: 62 kg  Vital Signs: Temp: 98.9 F (37.2 C) (01/04 0841) Temp Source: Oral (01/04 0532) BP: 126/86 (01/04 0841) Pulse Rate: 97 (01/04 0532)  Labs:  Recent Labs  11/19/16 0851 11/19/16 1214 11/19/16 1834 11/20/16 0256 11/21/16 0719  HGB 9.5*  --   --  8.6* 8.3*  HCT 30.1*  --   --  27.7* 27.1*  PLT 280  --   --  273 278  APTT  --  38*  --   --   --   LABPROT  --  12.9  --   --   --   INR  --  0.97  --   --   --   HEPARINUNFRC  --   --  0.31 0.54 0.57  CREATININE 0.58  --   --  0.78 0.60  TROPONINI <0.03  --   --   --   --     Estimated Creatinine Clearance: 76.2 mL/min (by C-G formula based on SCr of 0.6 mg/dL).   Assessment: 46 yo F on heparin for PE. Heparin level therapeutic (0.57) on gtt at 1200 units/hr. Hgb 8.3, stable,  plt stable. No bleeding noted.  Pharmacy consulted to transition UFH to Eliquis for PE.  Creat 0.57.      Plan:  DC heparin drip, DC heparin levels and CBCs Start Eliquis 10 mg po BID x 7 days then on Thursday January 11th, dose decreases to Eliquis 5 mg po BID.  Total duration 6 months per Dr Lorin Mercy chart note.  DC education kit given to patient along with 30 day free card and 10$ copay card Pt educated on Eliquis and all questions answered  Eudelia Bunch, Pharm.D. [phone] 11/21/2016 10:45 AM

## 2016-11-22 LAB — CBC
HCT: 27 % — ABNORMAL LOW (ref 36.0–46.0)
Hemoglobin: 8.4 g/dL — ABNORMAL LOW (ref 12.0–15.0)
MCH: 24.1 pg — ABNORMAL LOW (ref 26.0–34.0)
MCHC: 31.1 g/dL (ref 30.0–36.0)
MCV: 77.4 fL — ABNORMAL LOW (ref 78.0–100.0)
Platelets: 301 10*3/uL (ref 150–400)
RBC: 3.49 MIL/uL — ABNORMAL LOW (ref 3.87–5.11)
RDW: 18.7 % — ABNORMAL HIGH (ref 11.5–15.5)
WBC: 5.5 10*3/uL (ref 4.0–10.5)

## 2016-11-22 LAB — GLUCOSE, CAPILLARY
Glucose-Capillary: 89 mg/dL (ref 65–99)
Glucose-Capillary: 125 mg/dL — ABNORMAL HIGH (ref 65–99)
Glucose-Capillary: 87 mg/dL (ref 65–99)

## 2016-11-22 LAB — BASIC METABOLIC PANEL
Anion gap: 8 (ref 5–15)
BUN: 8 mg/dL (ref 6–20)
CO2: 27 mmol/L (ref 22–32)
Calcium: 9 mg/dL (ref 8.9–10.3)
Chloride: 102 mmol/L (ref 101–111)
Creatinine, Ser: 0.62 mg/dL (ref 0.44–1.00)
GFR calc Af Amer: >60 mL/min (ref >60)
GFR calc non Af Amer: >60 mL/min (ref >60)
Glucose, Bld: 97 mg/dL (ref 65–99)
Potassium: 4.1 mmol/L (ref 3.5–5.1)
Sodium: 137 mmol/L (ref 135–145)

## 2016-11-22 MED ORDER — APIXABAN 5 MG PO TABS: ORAL_TABLET | ORAL | 0 refills | Status: DC

## 2016-11-22 MED ORDER — LOSARTAN POTASSIUM 50 MG PO TABS: 25.0000 mg | ORAL_TABLET | Freq: Every day | ORAL | Status: DC

## 2016-11-22 MED ORDER — POLYETHYLENE GLYCOL 3350 17 G PO PACK: 17.0000 g | PACK | Freq: Every day | ORAL | 0 refills | Status: DC | PRN

## 2016-11-22 MED ORDER — PHENOL 1.4 % MT LIQD: 1.0000 | OROMUCOSAL | Status: DC | PRN

## 2016-11-22 MED ORDER — FERROUS SULFATE 325 (65 FE) MG PO TABS: 325.0000 mg | ORAL_TABLET | Freq: Two times a day (BID) | ORAL | 3 refills | Status: DC

## 2016-11-22 MED ORDER — LOSARTAN POTASSIUM 25 MG PO TABS: 25.0000 mg | ORAL_TABLET | Freq: Every day | ORAL | 0 refills | Status: DC

## 2016-11-22 MED ORDER — OXYCODONE-ACETAMINOPHEN 5-325 MG PO TABS: 1.0000 | ORAL_TABLET | Freq: Four times a day (QID) | ORAL | 0 refills | Status: DC | PRN

## 2016-11-22 NOTE — Discharge Summary (Signed)
Jennifer Mullins, is a 46 y.o. female  DOB Aug 17, 1971  MRN [id number].  Admission date:  11/19/2016  Admitting Physician  Thurnell Lose, MD  Discharge Date:  11/22/2016   Primary MD  Mary-Margaret Hassell Done, FNP  Recommendations for primary care physician for things to follow:  - Patient needs to continue liquids for total of 6 months   Admission Diagnosis  Other acute pulmonary embolism without acute cor pulmonale (HCC) [I26.99]   Discharge Diagnosis  Other acute pulmonary embolism without acute cor pulmonale (HCC) [I26.99]    Principal Problem:   Pulmonary emboli (HCC) Active Problems:   HTN (hypertension)   Diabetes ([place])   GAD (generalized anxiety disorder)   GERD (gastroesophageal reflux disease)      Past Medical History:  Diagnosis Date  . Anemia   . Anxiety   . Arthritis   . Asthma   . Bilateral calcaneal spurs   . Bilateral polycystic ovarian syndrome   . Bulging lumbar disc   . Carpal tunnel syndrome, bilateral   . COPD (chronic obstructive pulmonary disease) (HCC)    BRONCHITIS  . Diabetes mellitus without complication ([place])    TYPE 2   DX  4-5 YRS AGO  . Fibroids    uterine  . GAD (generalized anxiety disorder)   . GERD (gastroesophageal reflux disease)    TAKES PRESCRIPTION MEDS  . Glaucoma    BOTH EYES  . Hyperlipidemia   . Hypertension   . Hypothyroidism    NODULES ON THYROID  . PONV (postoperative nausea and vomiting)   . Pulmonary embolism ([place])   . Tachycardia     Past Surgical History:  Procedure Laterality Date  . ANTERIOR CERVICAL DECOMP/DISCECTOMY FUSION N/A 11/13/2016   Procedure: Cervical five-six, Cervical six-seven Anterior Cervical Discectomy and Fusion, Allograft, Plate;  Surgeon: Marybelle Killings, MD;  Location: [place];  Service: Orthopedics;  Laterality: N/A;  . BREAST SURGERY    . CARPAL TUNNEL RELEASE    . DILATION AND CURETTAGE OF UTERUS    .  EXPLORATORY LAPAROTOMY    . NASAL SINUS SURGERY  07/22/13   Dr. Redmond Pulling in [place]       History of present illness and  Hospital Course:     Kindly see H&P for history of present illness and admission details, please review complete Labs, Consult reports and Test reports for all details in brief  HPI  from the history and physical done on the day of admission 11/19/2016  Jennifer Mullins  is a 46 y.o. female, With history of hypertension, DM type II, COPD, C-spine DJD who had a C-spine surgery by Dr. Lorin Mercy at [facility] on 11/13/2017, she was discharged with a c-collar in good condition. Apparently patient chronically had neck and left arm pain with tingling numbness in the left arm, sometime yesterday morning she started having some left-sided chest pain and shortness of breath. Pain was pleuritic nonradiating, worse with deep breaths better with rest and pain medications.   Came to the ER where  she was diagnosed with left lingular PE without hemodynamic compromise or right heart strain, she was started on heparin in the ER and I was called to admit. At this time besides the ongoing postop neck pain which radiates to left arm and shoulder, pleuritic left-sided chest pain and mild exertional shortness of breath patient is symptom-free.  She denies any fever chills, no shortness of breath at rest, no productive cough, no headache, no abdominal pain, no blood in stool or urine, no dysuria or focal weakness.  Hospital Course  46 y.o.female,With history of hypertension, DM type II, COPD, C-spine DJD who had a C-spine surgery by Dr. Lorin Mercy at [facility] on 11/13/2017, she was discharged with a c-collar in good condition, she presents with SOB and CP, CTA chest significant for left lingular PE without hemodynamic compromise  Left lingular PE - Provoked, after neurosurgery, with anticoagulation for 6 month. - Lower extremity venous Doppler with no evidence of DVT, left upper extremity  with no evidence of deep DVT, only indeterminate age thrombosis in basilic vein - Initially on Heparin GTT,  transitioned to Eliquis .  Recent C-spine surgery - seen by Dr. Lorin Mercy, continue with soft c-collar - Continue with when necessary pain medication  Hypertension - Acceptable,  She reports cough to lisinopril, started on losartan  DM type II.  - resume Glucophage on discharge,   COPD.  - Stable, no wheezing,   Dyslipidemia.  - On statin continue.  GERD.  - On PPI.  Discharge Condition:  stable   Follow UP  Follow-up Information    Mary-Margaret Kroese, FNP Follow up in 1 week(s).   Specialty:  Family Medicine Contact information: [address] [place] [id number] [phone]             Discharge Instructions  and  Discharge Medications    Discharge Instructions    Discharge instructions    Complete by:  As directed    Follow with Primary MD Mary-Margaret Hassell Done, FNP in 7 days   Get CBC, CMP,checked  by Primary MD next visit.    Activity: As tolerated with Full fall precautions use walker/cane & assistance as needed   Disposition Home **   Diet: Heart Healthy , carb modified, with feeding assistance and aspiration precautions.  For Heart failure patients - Check your Weight same time everyday, if you gain over 2 pounds, or you develop in leg swelling, experience more shortness of breath or chest pain, call your Primary MD immediately. Follow Cardiac Low Salt Diet and 1.5 lit/day fluid restriction.   On your next visit with your primary care physician please Get Medicines reviewed and adjusted.   Please request your Prim.MD to go over all Hospital Tests and Procedure/Radiological results at the follow up, please get all Hospital records sent to your Prim MD by signing hospital release before you go home.   If you experience worsening of your admission symptoms, develop shortness of breath, life threatening emergency, suicidal  or homicidal thoughts you must seek medical attention immediately by calling 911 or calling your MD immediately  if symptoms less severe.  You Must read complete instructions/literature along with all the possible adverse reactions/side effects for all the Medicines you take and that have been prescribed to you. Take any new Medicines after you have completely understood and accpet all the possible adverse reactions/side effects.   Do not drive, operating heavy machinery, perform activities at heights, swimming or participation in water activities or provide baby sitting  services if your were admitted for syncope or siezures until you have seen by Primary MD or a Neurologist and advised to do so again.  Do not drive when taking Pain medications.    Do not take more than prescribed Pain, Sleep and Anxiety Medications  Special Instructions: If you have smoked or chewed Tobacco  in the last 2 yrs please stop smoking, stop any regular Alcohol  and or any Recreational drug use.  Wear Seat belts while driving.   Please note  You were cared for by a hospitalist during your hospital stay. If you have any questions about your discharge medications or the care you received while you were in the hospital after you are discharged, you can call the unit and asked to speak with the hospitalist on call if the hospitalist that took care of you is not available. Once you are discharged, your primary care physician will handle any further medical issues. Please note that NO REFILLS for any discharge medications will be authorized once you are discharged, as it is imperative that you return to your primary care physician (or establish a relationship with a primary care physician if you do not have one) for your aftercare needs so that they can reassess your need for medications and monitor your lab values.   Increase activity slowly    Complete by:  As directed      Allergies as of 11/22/2016      Reactions    Demerol [meperidine] Shortness Of Breath   FLUSHING AND SHORTNESS OF BREATH   Morphine And Related Shortness Of Breath   Flushed and hot hyper   Diltiazem Hcl Hives      Medication List    STOP taking these medications   aspirin EC 81 MG tablet   lisinopril 10 MG tablet Commonly known as:  PRINIVIL,ZESTRIL     TAKE these medications   albuterol 108 (90 Base) MCG/ACT inhaler Commonly known as:  PROVENTIL HFA;VENTOLIN HFA Inhale 1-2 puffs into the lungs daily as needed. Rescue inhaler What changed:  reasons to take this  additional instructions   ALPRAZolam 0.5 MG tablet Commonly known as:  XANAX Take 1 tablet (0.5 mg total) by mouth 2 (two) times daily as needed. What changed:  reasons to take this   apixaban 5 MG Tabs tablet Commonly known as:  ELIQUIS Eliquis 10 mg po BID x 6 days then on Thursday January 11th, dose decreases to Eliquis 5 mg po BID   benzonatate 200 MG capsule Commonly known as:  TESSALON Take 200 mg by mouth 3 (three) times daily as needed for cough.   bimatoprost 0.01 % Soln Commonly known as:  LUMIGAN Place 1 drop into both eyes at bedtime.   budesonide-formoterol 160-4.5 MCG/ACT inhaler Commonly known as:  SYMBICORT Inhale 2 puffs into the lungs 2 (two) times daily.   carboxymethylcellulose 0.5 % Soln Commonly known as:  REFRESH PLUS Place 1 drop into both eyes 3 (three) times daily as needed (dryness).   cyproheptadine 4 MG tablet Commonly known as:  PERIACTIN Take 1 tablet (4 mg total) by mouth at bedtime.   famotidine 20 MG tablet Commonly known as:  PEPCID Take 1 tablet (20 mg total) by mouth at bedtime.   ferrous sulfate 325 (65 FE) MG tablet Take 1 tablet (325 mg total) by mouth 2 (two) times daily with a meal.   fluticasone 50 MCG/ACT nasal spray Commonly known as:  FLONASE Place 2 sprays into both nostrils daily.  furosemide 20 MG tablet Commonly known as:  LASIX Take 1 tablet (20 mg total) by mouth daily.     hydrOXYzine 50 MG tablet Commonly known as:  ATARAX/VISTARIL Take 1 tablet (50 mg total) by mouth 3 (three) times daily as needed for itching.   ipratropium 0.03 % nasal spray Commonly known as:  ATROVENT Place 2 sprays into both nostrils every 12 (twelve) hours. What changed:  when to take this  reasons to take this   losartan 25 MG tablet Commonly known as:  COZAAR Take 1 tablet (25 mg total) by mouth daily.   metFORMIN 500 MG tablet Commonly known as:  GLUCOPHAGE Take 1 tablet (500 mg total) by mouth 2 (two) times daily with a meal. What changed:  when to take this  reasons to take this   montelukast 10 MG tablet Commonly known as:  SINGULAIR TAKE 1 TABLET BY MOUTH AT BEDTIME   Olopatadine HCl 0.6 % Soln Place 1 drop (1 puff total) into the nose every morning. What changed:  how to take this  when to take this  reasons to take this   oxyCODONE-acetaminophen 5-325 MG tablet Commonly known as:  PERCOCET/ROXICET Take 1 tablet by mouth every 6 (six) hours as needed for moderate pain.   pantoprazole 40 MG tablet Commonly known as:  PROTONIX Take 1 tablet (40 mg total) by mouth daily.   polyethylene glycol packet Commonly known as:  MIRALAX / GLYCOLAX Take 17 g by mouth daily as needed for mild constipation.   pravastatin 40 MG tablet Commonly known as:  PRAVACHOL Take 1 tablet (40 mg total) by mouth daily.   pseudoephedrine-guaifenesin 60-600 MG 12 hr tablet Commonly known as:  MUCINEX D Take 1 tablet by mouth every 12 (twelve) hours as needed for congestion.         Diet and Activity recommendation: See Discharge Instructions above   Consults obtained -  ortho Dr Lorin Mercy   Major procedures and Radiology Reports - PLEASE review detailed and final reports for all details, in brief -      Dg Chest 2 View  Result Date: 11/19/2016 CLINICAL DATA:  Left side chest pain. EXAM: CHEST  2 VIEW COMPARISON:  04/02/2016 FINDINGS: Airspace opacity noted  at the left base concerning for pneumonia. Right lung is clear. Heart is normal size. No effusions or acute bony abnormality. IMPRESSION: Left basilar opacity concerning for pneumonia. Electronically Signed   By: Rolm Baptise M.D.   On: 11/19/2016 09:17   Dg Cervical Spine 2-3 Views  Result Date: 11/13/2016 CLINICAL DATA:  Anterior fusion at C5-6 and C6-7 EXAM: CERVICAL SPINE - 2-3 VIEW COMPARISON:  MR C-spine of 10/09/2015 FINDINGS: Two C-arm spot films show anterior fusion at C5-6 and [id number] with no complicating features noted. Interbody fusion plugs at C5-6 and C6-7 appear to be in good position with normal alignment maintained. IMPRESSION: Anterior fusion at C5-6 and C6-7.  No complicating features. Electronically Signed   By: Ivar Drape M.D.   On: 11/13/2016 15:51   Ct Angio Chest Pe W And/or Wo Contrast  Result Date: 11/19/2016 CLINICAL DATA:  46 year old female status post cervical spine she surgery 6 days ago with left anterior chest and shoulder pain. Hemoptysis for 3 days. Initial encounter. EXAM: CT ANGIOGRAPHY CHEST WITH CONTRAST TECHNIQUE: Multidetector CT imaging of the chest was performed using the standard protocol during bolus administration of intravenous contrast. Multiplanar CT image reconstructions and MIPs were obtained to evaluate the vascular anatomy. CONTRAST:  100  mL Isovue 370 COMPARISON:  Chest radiographs 0855 hours today. [facility] Chest CTA 06/21/2013 FINDINGS: Cardiovascular: Good contrast bolus timing in the pulmonary arterial tree. Positive for pulmonary emboli. Left lingula and possibly also anterior basal segment occlusive pulmonary thrombus. No left upper lobe involvement identified. No central or saddle embolus. No right lung involvement identified. The interventricular septum has a normal configuration without CT evidence of right heart strain. RV / LV ratio = 0.7. No pericardial effusion. Negative visualized aorta. Mediastinum/Nodes: No mediastinal  lymphadenopathy. Lungs/Pleura: Major airways are patent. Small layering left pleural effusion. Confluent ground-glass opacity in the posterolateral lingula is observed by the most conspicuous area of occlusive thrombus and is compatible with pulmonary infarct (series 9, image 61). There is otherwise dependent opacity in the lower lobe which may simply reflect atelectasis. In the contralateral right lung there is mild dependent atelectasis. Upper Abdomen: Negative visualized liver, spleen, pancreas, left adrenal gland, left kidney, and bowel in the upper abdomen. Other findings: Mild postoperative prevertebral fluid or inflammation at the cervicothoracic junction. Visualized thoracic inlet otherwise within normal limits. Musculoskeletal: Sequelae of lower cervical ACDF to the C7 level. Visible hardware and cervical vertebrae appear intact. Minimal thoracic scoliosis. No acute thoracic osseous abnormality. Review of the MIP images confirms the above findings. IMPRESSION: 1. Positive for acute pulmonary embolus with lingula involvement. Lingula pulmonary infarct. Small layering left pleural effusion. No central thrombus and no CTA evidence of right heart strain. 2. Critical Value/emergent results were called by telephone at the time of interpretation on 11/19/2016 at 11:20 am to Dr. Nat Christen , who verbally acknowledged these results. Electronically Signed   By: Genevie Ann M.D.   On: 11/19/2016 11:20   Dg C-arm 1-60 Min  Result Date: 11/13/2016 CLINICAL DATA:  Anterior fusion at C5-6 and C6-7 EXAM: DG C-ARM 61-120 MIN COMPARISON:  MR C-spine of 10/09/2015 FINDINGS: C-arm fluoroscopy was provided during anterior fusion. 9 seconds of fluoroscopy time was recorded. IMPRESSION: C-arm fluoroscopy provided. Electronically Signed   By: Ivar Drape M.D.   On: 11/13/2016 15:51    Micro Results     Recent Results (from the past 240 hour(s))  MRSA PCR Screening     Status: None   Collection Time: 11/19/16  9:44 PM    Result Value Ref Range Status   MRSA by PCR NEGATIVE NEGATIVE Final    Comment:        The GeneXpert MRSA Assay (FDA approved for NASAL specimens only), is one component of a comprehensive MRSA colonization surveillance program. It is not intended to diagnose MRSA infection nor to guide or monitor treatment for MRSA infections.        Today   Subjective:   Annica Schwisow today has no headache,no chest or abdominal pain,no new weakness tingling or numbness, feels much better  today.   Objective:   Blood pressure 128/76, pulse (!) 102, temperature 98.2 F (36.8 C), temperature source Oral, resp. rate 18, height 5' 1.5" (1.562 m), weight 62.2 kg (137 lb 3.2 oz), last menstrual period 11/03/2016, SpO2 98 %.  No intake or output data in the 24 hours ending 11/22/16 1134  Exam Awake Alert, Oriented x 3, No new F.N deficits, Normal affect [place].AT,PERRAL Supple Neck,No JVD, chronic soft c-collar Symmetrical Chest wall movement, Good air movement bilaterally, CTAB RRR,No Gallops,Rubs or new Murmurs, No Parasternal Heave +ve B.Sounds, Abd Soft, Non tender, , No rebound -guarding or rigidity. No Cyanosis, Clubbing or edema, No new Rash or bruise  Data Review   CBC w Diff:  Lab Results  Component Value Date   WBC 5.5 11/22/2016   HGB 8.4 (L) 11/22/2016   HCT 27.0 (L) 11/22/2016   HCT 29.7 (L) 10/08/2016   PLT 301 11/22/2016   PLT 284 10/08/2016   LYMPHOPCT 12 11/19/2016   MONOPCT 10 11/19/2016   EOSPCT 1 11/19/2016   BASOPCT 0 11/19/2016    CMP:  Lab Results  Component Value Date   NA 137 11/22/2016   NA 141 10/04/2016   K 4.1 11/22/2016   CL 102 11/22/2016   CO2 27 11/22/2016   BUN 8 11/22/2016   BUN 6 10/04/2016   CREATININE 0.62 11/22/2016   PROT 7.9 11/19/2016   PROT 6.6 10/04/2016   ALBUMIN 3.8 11/19/2016   ALBUMIN 4.1 10/04/2016   BILITOT 0.3 11/19/2016   BILITOT <0.2 10/04/2016   ALKPHOS 60 11/19/2016   AST 12 (L) 11/19/2016   ALT 12 (L) 11/19/2016   .   Total Time in preparing paper work, data evaluation and todays exam - 35 minutes  ELGERGAWY, DAWOOD M.D on 11/22/2016 at 11:34 AM  [facility]   Office  [phone]

## 2016-11-22 NOTE — Care Management Note (Signed)
Case Management Note  Patient Details  Name: Jennifer Mullins MRN: [id number] Date of Birth: 07/27/1971  Subjective/Objective:   PE                 Action/Plan: Discharge Planning: AVS reviewed: please see previous NCM notes.  NCM spoke to pt and offered choice for [facility]. Pt agreeable to [facility] for [facility] PT. Contacted [facility] Liaison with new referral for scheduled dc home today. Contacted [facility] DME rep for RW and 3n1 for home. Pt states she lives at home with friend that will assist as needed. Pt has Eliquis 30 day cards and copay card.   PCP  Chevis Pretty MD   Expected Discharge Date:  11/22/2016           Expected Discharge Plan:  [place]  In-House Referral:  NA  Discharge planning Services  CM Consult  Post Acute Care Choice:  Home Health Choice offered to:  Patient  DME Arranged:  3-N-1, [place] rolling DME Agency:  [place]:  PT [place]:  [place]  Status of Service:  Completed, signed off  If discussed at [place] of Stay Meetings, dates discussed:    Additional Comments:  Erenest Rasher, RN 11/22/2016, 12:16 PM

## 2016-11-23 ENCOUNTER — Encounter (HOSPITAL_COMMUNITY): Payer: Self-pay | Admitting: Emergency Medicine

## 2016-11-23 ENCOUNTER — Inpatient Hospital Stay (HOSPITAL_COMMUNITY)
Admission: EM | Admit: 2016-11-23 | Discharge: 2016-11-28 | DRG: 760 | Disposition: A | Discharge status: Home Health | Payer: BLUE CROSS/BLUE SHIELD | Attending: Internal Medicine | Admitting: Internal Medicine

## 2016-11-23 DIAGNOSIS — Z888 Allergy status to other drugs, medicaments and biological substances status: Secondary | ICD-10-CM

## 2016-11-23 DIAGNOSIS — Z87891 Personal history of nicotine dependence: Secondary | ICD-10-CM

## 2016-11-23 DIAGNOSIS — R000 Tachycardia, unspecified: Secondary | ICD-10-CM | POA: Diagnosis present

## 2016-11-23 DIAGNOSIS — N939 Abnormal uterine and vaginal bleeding, unspecified: Secondary | ICD-10-CM

## 2016-11-23 DIAGNOSIS — Z7984 Long term (current) use of oral hypoglycemic drugs: Secondary | ICD-10-CM

## 2016-11-23 DIAGNOSIS — I10 Essential (primary) hypertension: Secondary | ICD-10-CM | POA: Diagnosis present

## 2016-11-23 DIAGNOSIS — E785 Hyperlipidemia, unspecified: Secondary | ICD-10-CM | POA: Diagnosis present

## 2016-11-23 DIAGNOSIS — D62 Acute posthemorrhagic anemia: Secondary | ICD-10-CM | POA: Diagnosis present

## 2016-11-23 DIAGNOSIS — Z7901 Long term (current) use of anticoagulants: Secondary | ICD-10-CM

## 2016-11-23 DIAGNOSIS — D259 Leiomyoma of uterus, unspecified: Secondary | ICD-10-CM | POA: Diagnosis present

## 2016-11-23 DIAGNOSIS — E119 Type 2 diabetes mellitus without complications: Secondary | ICD-10-CM | POA: Diagnosis present

## 2016-11-23 DIAGNOSIS — Z79899 Other long term (current) drug therapy: Secondary | ICD-10-CM

## 2016-11-23 DIAGNOSIS — E039 Hypothyroidism, unspecified: Secondary | ICD-10-CM | POA: Diagnosis present

## 2016-11-23 DIAGNOSIS — D509 Iron deficiency anemia, unspecified: Secondary | ICD-10-CM | POA: Diagnosis present

## 2016-11-23 DIAGNOSIS — H409 Unspecified glaucoma: Secondary | ICD-10-CM | POA: Diagnosis present

## 2016-11-23 DIAGNOSIS — N938 Other specified abnormal uterine and vaginal bleeding: Secondary | ICD-10-CM | POA: Diagnosis not present

## 2016-11-23 DIAGNOSIS — Z7951 Long term (current) use of inhaled steroids: Secondary | ICD-10-CM

## 2016-11-23 DIAGNOSIS — Z885 Allergy status to narcotic agent status: Secondary | ICD-10-CM

## 2016-11-23 DIAGNOSIS — I2699 Other pulmonary embolism without acute cor pulmonale: Secondary | ICD-10-CM | POA: Diagnosis present

## 2016-11-23 DIAGNOSIS — F411 Generalized anxiety disorder: Secondary | ICD-10-CM | POA: Diagnosis present

## 2016-11-23 DIAGNOSIS — K219 Gastro-esophageal reflux disease without esophagitis: Secondary | ICD-10-CM | POA: Diagnosis present

## 2016-11-23 DIAGNOSIS — J440 Chronic obstructive pulmonary disease with acute lower respiratory infection: Secondary | ICD-10-CM | POA: Diagnosis present

## 2016-11-23 LAB — CBC WITH DIFFERENTIAL/PLATELET
Basophils Absolute: 0 10*3/uL (ref 0.0–0.1)
Basophils Relative: 0 %
Eosinophils Absolute: 0.1 10*3/uL (ref 0.0–0.7)
Eosinophils Relative: 2 %
HCT: 29.2 % — ABNORMAL LOW (ref 36.0–46.0)
Hemoglobin: 9.3 g/dL — ABNORMAL LOW (ref 12.0–15.0)
Lymphocytes Relative: 21 %
Lymphs Abs: 1.7 10*3/uL (ref 0.7–4.0)
MCH: 24.6 pg — ABNORMAL LOW (ref 26.0–34.0)
MCHC: 31.8 g/dL (ref 30.0–36.0)
MCV: 77.2 fL — ABNORMAL LOW (ref 78.0–100.0)
Monocytes Absolute: 0.4 10*3/uL (ref 0.1–1.0)
Monocytes Relative: 5 %
Neutro Abs: 5.6 10*3/uL (ref 1.7–7.7)
Neutrophils Relative %: 72 %
Platelets: 401 10*3/uL — ABNORMAL HIGH (ref 150–400)
RBC: 3.78 MIL/uL — ABNORMAL LOW (ref 3.87–5.11)
RDW: 18.6 % — ABNORMAL HIGH (ref 11.5–15.5)
WBC: 7.8 10*3/uL (ref 4.0–10.5)

## 2016-11-23 LAB — PROTIME-INR
INR: 1.26
Prothrombin Time: 15.9 seconds — ABNORMAL HIGH (ref 11.4–15.2)

## 2016-11-23 MED ORDER — IPRATROPIUM-ALBUTEROL 0.5-2.5 (3) MG/3ML IN SOLN
3.0000 mL | Freq: Once | RESPIRATORY_TRACT | Status: AC
  Administered 2016-11-23: 3 mL via RESPIRATORY_TRACT
  Filled 2016-11-23: qty 3

## 2016-11-23 NOTE — ED Triage Notes (Signed)
Per EMS Pt was having difficulty breathing upon there arrival. Pt stated she had just gotten out of hospital yesterday 11/23/15 due to a PE in the left lung and left arm. Pt stated she has been coughing up blood. Upon arrival BP 200/110 HR 130 RR 30 Sinus Tachy

## 2016-11-23 NOTE — ED Provider Notes (Signed)
[facility] DEPT Provider Note   CSN: [id number] Arrival date & time: 11/23/16  2230 By signing my name below, I, Georgette Shell, attest that this documentation has been prepared under the direction and in the presence of Ezequiel Essex, MD. Electronically Signed: Georgette Shell, ED Scribe. 11/23/16. 11:24 PM.  History   Chief Complaint Chief Complaint  Patient presents with  . Shortness of Breath   The history is provided by the patient. No language interpreter was used.    HPI Comments:and  Jennifer Mullins is a 46 y.o. female with h/o COPD, asthma, bronchitis, DM, HTN, and PE, who presents to the Emergency Department complaining of increased shortness of breath and cough onset one day ago. Pt also has associated vaginal bleeding and intermittent dizziness. Pt has chest pain secondary to her cough. Pt was discharged for her symptoms one day ago and was diagnosed with a PE in her left lung and left arm. She states her symptoms at this time feel worse than one day ago. She was placed on Eliquis and has been compliant with her medications. No other alleviating factors noted. Pt has a plate in her neck which was placed on 11/13/16. Denies h/o MI or CHF. Pt further denies fever, chills, vomiting, diarrhea, or any other associated symptoms.   Past Medical History:  Diagnosis Date  . Anemia   . Anxiety   . Arthritis   . Asthma   . Bilateral calcaneal spurs   . Bilateral polycystic ovarian syndrome   . Bulging lumbar disc   . Carpal tunnel syndrome, bilateral   . COPD (chronic obstructive pulmonary disease) (HCC)    BRONCHITIS  . Diabetes mellitus without complication ([place])    TYPE 2   DX  4-5 YRS AGO  . Fibroids    uterine  . GAD (generalized anxiety disorder)   . GERD (gastroesophageal reflux disease)    TAKES PRESCRIPTION MEDS  . Glaucoma    BOTH EYES  . Hyperlipidemia   . Hypertension   . Hypothyroidism    NODULES ON THYROID  . PONV (postoperative nausea and vomiting)   . Pulmonary  embolism ([place])   . Tachycardia     Patient Active Problem List   Diagnosis Date Noted  . Pulmonary emboli ([place]) 11/19/2016  . Spondylosis of cervical region without myelopathy or radiculopathy 11/13/2016  . Allergic rhinoconjunctivitis 07/28/2015  . Chronic headache 07/28/2015  . PCOS (polycystic ovarian syndrome) 06/20/2015  . 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 Hyperlipidemia LDL goal <100 06/20/2015  . GAD (generalized anxiety disorder) 05/18/2014  . GERD (gastroesophageal reflux disease) 05/18/2014  . Body fluid retention 07/07/2013  . Chronic sinusitis 07/02/2013  . Extrinsic asthma 05/20/2013  . HTN (hypertension) 05/20/2013  . Diabetes ([place]) 05/20/2013    Past Surgical History:  Procedure Laterality Date  . ANTERIOR CERVICAL DECOMP/DISCECTOMY FUSION N/A 11/13/2016   Procedure: Cervical five-six, Cervical six-seven Anterior Cervical Discectomy and Fusion, Allograft, Plate;  Surgeon: Marybelle Killings, MD;  Location: [facility];  Service: Orthopedics;  Laterality: N/A;  . BREAST SURGERY    . CARPAL TUNNEL RELEASE    . DILATION AND CURETTAGE OF UTERUS    . EXPLORATORY LAPAROTOMY    . NASAL SINUS SURGERY  07/22/13   Dr. Redmond Pulling in [place] History    No data available       Home Medications    Prior to Admission medications   Medication Sig Start Date End Date Taking? Authorizing Provider  albuterol (PROVENTIL HFA;VENTOLIN  HFA) 108 (90 Base) MCG/ACT inhaler Inhale 1-2 puffs into the lungs daily as needed. Rescue inhaler Patient taking differently: Inhale 1-2 puffs into the lungs daily as needed for wheezing or shortness of breath. Rescue inhaler 11/04/16   Mary-Margaret Hassell Done, FNP  ALPRAZolam Duanne Moron) 0.5 MG tablet Take 1 tablet (0.5 mg total) by mouth 2 (two) times daily as needed. Patient taking differently: Take 0.5 mg by mouth 2 (two) times daily as needed for anxiety.  10/04/16   Mary-Margaret Hassell Done, FNP  apixaban (ELIQUIS) 5 MG TABS tablet Eliquis 10 mg po BID x  6 days then on Thursday January 11th, dose decreases to Eliquis 5 mg po BID 11/22/16   Silver Huguenin Elgergawy, MD  benzonatate (TESSALON) 200 MG capsule Take 200 mg by mouth 3 (three) times daily as needed for cough.    Historical Provider, MD  bimatoprost (LUMIGAN) 0.01 % SOLN Place 1 drop into both eyes at bedtime.     Historical Provider, MD  budesonide-formoterol (SYMBICORT) 160-4.5 MCG/ACT inhaler Inhale 2 puffs into the lungs 2 (two) times daily.    Historical Provider, MD  carboxymethylcellulose (REFRESH PLUS) 0.5 % SOLN Place 1 drop into both eyes 3 (three) times daily as needed (dryness).     Historical Provider, MD  cyproheptadine (PERIACTIN) 4 MG tablet Take 1 tablet (4 mg total) by mouth at bedtime. 11/04/16   Mary-Margaret Hassell Done, FNP  famotidine (PEPCID) 20 MG tablet Take 1 tablet (20 mg total) by mouth at bedtime. 10/04/16   Mary-Margaret Hassell Done, FNP  ferrous sulfate 325 (65 FE) MG tablet Take 1 tablet (325 mg total) by mouth 2 (two) times daily with a meal. 11/22/16   Silver Huguenin Elgergawy, MD  fluticasone (FLONASE) 50 MCG/ACT nasal spray Place 2 sprays into both nostrils daily.    Historical Provider, MD  furosemide (LASIX) 20 MG tablet Take 1 tablet (20 mg total) by mouth daily. 10/04/16   Mary-Margaret Hassell Done, FNP  hydrOXYzine (ATARAX/VISTARIL) 50 MG tablet Take 1 tablet (50 mg total) by mouth 3 (three) times daily as needed for itching. 11/04/16   Mary-Margaret Hassell Done, FNP  ipratropium (ATROVENT) 0.03 % nasal spray Place 2 sprays into both nostrils every 12 (twelve) hours. Patient taking differently: Place 2 sprays into both nostrils every 12 (twelve) hours as needed for rhinitis.  08/09/15   Mary-Margaret Hassell Done, FNP  losartan (COZAAR) 25 MG tablet Take 1 tablet (25 mg total) by mouth daily. 11/22/16   Silver Huguenin Elgergawy, MD  metFORMIN (GLUCOPHAGE) 500 MG tablet Take 1 tablet (500 mg total) by mouth 2 (two) times daily with a meal. Patient taking differently: Take 500 mg by mouth 2 (two) times  daily as needed (depending on sugar).  10/04/16   Mary-Margaret Hassell Done, FNP  montelukast (SINGULAIR) 10 MG tablet TAKE 1 TABLET BY MOUTH AT BEDTIME 10/31/16   Mary-Margaret Hassell Done, FNP  Olopatadine HCl 0.6 % SOLN Place 1 drop (1 puff total) into the nose every morning. Patient taking differently: Place 1 drop into both eyes daily as needed (alergies).  11/04/16   Mary-Margaret Hassell Done, FNP  oxyCODONE-acetaminophen (PERCOCET/ROXICET) 5-325 MG tablet Take 1 tablet by mouth every 6 (six) hours as needed for moderate pain. 11/22/16   Silver Huguenin Elgergawy, MD  pantoprazole (PROTONIX) 40 MG tablet Take 1 tablet (40 mg total) by mouth daily. 10/04/16   Mary-Margaret Hassell Done, FNP  polyethylene glycol (MIRALAX / GLYCOLAX) packet Take 17 g by mouth daily as needed for mild constipation. 11/22/16   Albertine Patricia, MD  pravastatin (  PRAVACHOL) 40 MG tablet Take 1 tablet (40 mg total) by mouth daily. 10/04/16   Mary-Margaret Hassell Done, FNP  pseudoephedrine-guaifenesin (MUCINEX D) 60-600 MG 12 hr tablet Take 1 tablet by mouth every 12 (twelve) hours as needed for congestion.     Historical Provider, MD    Family History Family History  Problem Relation Age of Onset  . Hypertension Mother   . Hypertension Father     Social History Social History  Substance Use Topics  . Smoking status: Former Smoker    Packs/day: 0.25    Years: 5.00    Types: Cigarettes    Quit date: 11/18/1989  . Smokeless tobacco: Never Used  . Alcohol use Yes     Comment: occasional     Allergies   Demerol [meperidine]; Morphine and related; and Diltiazem hcl   Review of Systems Review of Systems 10 Systems reviewed and all are negative for acute change except as noted in the HPI. Physical Exam Updated Vital Signs BP 131/93 (BP Location: Right Arm)   Pulse 120   Temp 99 F (37.2 C) (Oral)   Resp 22   Ht 5\' 2"  (1.575 m)   Wt 137 lb (62.1 kg)   LMP 11/03/2016   SpO2 98%   BMI 25.06 kg/m   Physical Exam  Constitutional:  She is oriented to person, place, and time. She appears well-developed and well-nourished. No distress.  Very anxious  HENT:  Head: Normocephalic and atraumatic.  Mouth/Throat: Oropharynx is clear and moist. No oropharyngeal exudate.  Eyes: Conjunctivae and EOM are normal. Pupils are equal, round, and reactive to light.  Neck: Normal range of motion. Neck supple.  No meningismus.  Cardiovascular: Normal rate, regular rhythm, normal heart sounds and intact distal pulses.   No murmur heard. Pulmonary/Chest: She is in respiratory distress. She has decreased breath sounds. She has no wheezes.  Mild respiratory distress, speaking in short sentences. Diminished breath sounds bilaterally. No wheezing.  Abdominal: Soft. There is no tenderness. There is no rebound and no guarding.  Musculoskeletal: Normal range of motion. She exhibits no edema or tenderness.  No leg swelling.  Neurological: She is alert and oriented to person, place, and time. No cranial nerve deficit. She exhibits normal muscle tone. Coordination normal.   5/5 strength throughout. CN 2-12 intact.Equal grip strength.   Skin: Skin is warm. There is pallor.  Psychiatric: She has a normal mood and affect. Her behavior is normal.  Nursing note and vitals reviewed.    ED Treatments / Results  DIAGNOSTIC STUDIES: Oxygen Saturation is 98% on RA, normal by my interpretation.    COORDINATION OF CARE: 11:22 PM Discussed treatment plan with pt at bedside and pt agreed to plan.  Labs (all labs ordered are listed, but only abnormal results are displayed) Labs Reviewed  CBC WITH DIFFERENTIAL/PLATELET - Abnormal; Notable for the following:       Result Value   RBC 3.78 (*)    Hemoglobin 9.3 (*)    HCT 29.2 (*)    MCV 77.2 (*)    MCH 24.6 (*)    RDW 18.6 (*)    Platelets 401 (*)    All other components within normal limits  BASIC METABOLIC PANEL - Abnormal; Notable for the following:    Chloride 100 (*)    Glucose, Bld 108 (*)      All other components within normal limits  PROTIME-INR - Abnormal; Notable for the following:    Prothrombin Time 15.9 (*)  All other components within normal limits  RAPID STREP SCREEN (NOT AT [facility])  CULTURE, GROUP A STREP [facility])  TROPONIN I  BRAIN NATRIURETIC PEPTIDE  HEMOGLOBIN AND HEMATOCRIT, BLOOD  HEMOGLOBIN AND HEMATOCRIT, BLOOD  TYPE AND SCREEN    EKG  EKG Interpretation  Date/Time:  Saturday November 23 2016 22:40:53 EST Ventricular Rate:  118 PR Interval:    QRS Duration: 76 QT Interval:  316 QTC Calculation: 443 R Axis:   52 Text Interpretation:  Sinus tachycardia Borderline T wave abnormalities Rate faster Confirmed by Wyvonnia Dusky  MD, STEPHEN [phone]) on 11/23/2016 11:25:50 PM       Radiology Dg Chest 2 View  Result Date: 11/24/2016 CLINICAL DATA:  Dyspnea. Recent hospitalization for pulmonary embolism. Hypertension. EXAM: CHEST  2 VIEW COMPARISON:  11/19/2016 FINDINGS: Persistent lingular opacity and small left pleural effusion. This may represent pulmonary infarction from the known emboli. Right lung is clear. Pulmonary vasculature is normal. Hilar and mediastinal contours are unremarkable. IMPRESSION: Small left effusion. Persistent lingular airspace opacity, possibly pulmonary infarction given the known recent emboli. Electronically Signed   By: Andreas Newport M.D.   On: 11/24/2016 00:56   Ct Angio Chest Pe W And/or Wo Contrast  Result Date: 11/24/2016 CLINICAL DATA:  46 y/o F; shortness of breath and history of pulmonary embolus. EXAM: CT ANGIOGRAPHY CHEST WITH CONTRAST TECHNIQUE: Multidetector CT imaging of the chest was performed using the standard protocol during bolus administration of intravenous contrast. Multiplanar CT image reconstructions and MIPs were obtained to evaluate the vascular anatomy. CONTRAST:  100 cc Isovue 370 COMPARISON:  11/19/2016 CT angiogram of the chest. FINDINGS: Cardiovascular: Respiratory motion artifact. Satisfactory opacification of  the pulmonary arteries. There is persistent occlusion of the left upper lobe lingular branch pulmonary artery. No new pulmonary embolus is identified. Normal heart size.  No pericardial effusion. Mediastinum/Nodes: No enlarged mediastinal, hilar, or axillary lymph nodes. Thyroid gland, trachea, and esophagus demonstrate no significant findings. Lungs/Pleura: Progressive opacification of the lingula likely represents worsening pulmonary infarct. No new airspace consolidation is identified. Trace left pleural effusion. Upper Abdomen: No acute abnormality. Musculoskeletal: No chest wall abnormality. No acute or significant osseous findings. Review of the MIP images confirms the above findings. IMPRESSION: 1. 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 2. Extensive respiratory motion artifact. No new pulmonary embolus identified. 3. 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 No new airspace opacity. These results were called by telephone at the time of interpretation on 11/24/2016 at 3:04 am to Dr. Ezequiel Essex , who verbally acknowledged these results. 1. Stable trace left pleural effusion. Electronically Signed   By: Kristine Garbe M.D.   On: 11/24/2016 03:06   US Transvaginal Non-ob  Result Date: 11/24/2016 CLINICAL DATA:  History of fibroids with heavy vaginal bleeding EXAM: TRANSABDOMINAL AND TRANSVAGINAL ULTRASOUND OF PELVIS TECHNIQUE: Both transabdominal and transvaginal ultrasound examinations of the pelvis were performed. Transabdominal technique was performed for global imaging of the pelvis including uterus, ovaries, adnexal regions, and pelvic cul-de-sac. It was necessary to proceed with endovaginal exam following the transabdominal exam to visualize the ovaries. COMPARISON:  09/05/2016 FINDINGS: Uterus Measurements: 10.2 x 5.1 x 5.4 cm. At least 2 uterine fibroids are noted. The largest of these measures 1.7 cm anteriorly near the fundus. Endometrium  Thickness: 11 mm. Slight heterogeneity is again identified and stable from the prior exam. Right ovary Measurements: 2.7 x 1.7 x 1.8 cm. Normal appearance/no adnexal mass. Left ovary Measurements: 3.3 x 1.9 x 2.3  cm. Normal appearance/no adnexal mass. Other findings Minimal free fluid is noted likely physiologic in nature. IMPRESSION: Uterine fibroid change is again noted. Stable mildly prominent endometrium. No acute abnormality noted. Electronically Signed   By: Inez Catalina M.D.   On: 11/24/2016 08:33   US Pelvis Complete  Result Date: 11/24/2016 CLINICAL DATA:  History of fibroids with heavy vaginal bleeding EXAM: TRANSABDOMINAL AND TRANSVAGINAL ULTRASOUND OF PELVIS TECHNIQUE: Both transabdominal and transvaginal ultrasound examinations of the pelvis were performed. Transabdominal technique was performed for global imaging of the pelvis including uterus, ovaries, adnexal regions, and pelvic cul-de-sac. It was necessary to proceed with endovaginal exam following the transabdominal exam to visualize the ovaries. COMPARISON:  09/05/2016 FINDINGS: Uterus Measurements: 10.2 x 5.1 x 5.4 cm. At least 2 uterine fibroids are noted. The largest of these measures 1.7 cm anteriorly near the fundus. Endometrium Thickness: 11 mm. Slight heterogeneity is again identified and stable from the prior exam. Right ovary Measurements: 2.7 x 1.7 x 1.8 cm. Normal appearance/no adnexal mass. Left ovary Measurements: 3.3 x 1.9 x 2.3 cm. Normal appearance/no adnexal mass. Other findings Minimal free fluid is noted likely physiologic in nature. IMPRESSION: Uterine fibroid change is again noted. Stable mildly prominent endometrium. No acute abnormality noted. Electronically Signed   By: Inez Catalina M.D.   On: 11/24/2016 08:33    Procedures Procedures (including critical care time)  Medications Ordered in ED Medications - No data to display   Initial Impression / Assessment and Plan / ED Course  I have reviewed the triage  vital signs and the nursing notes.  Pertinent labs & imaging results that were available during my care of the patient were reviewed by me and considered in my medical decision making (see chart for details).  Clinical Course    Patient presents with worsening shortness of breath, hemoptysis, difficulty breathing and cough. She was discharged from the hospital yesterday after being diagnosed with pulmonary embolism on Eliquis.  Chest x-ray shows left basilar effusion. Hemoglobin is stable. Patient is persistently tachycardic.  Complains of vaginal bleeding which she believes is due to uterine fibroids.  CT scan shows stable lingular PE with progressive opacity of the left lingula likely consistent with infarct. No new pulmonary embolism.  Patient declines pelvic exam.  Remains tachycardic, ongoing vaginal bleeding in setting of anticoagulation use. BP stable. Hemoglobin stable. Observation admission d/w Dr. Myna Hidalgo.  Final Clinical Impressions(s) / ED Diagnoses   Final diagnoses:  Other acute pulmonary embolism without acute cor pulmonale (HCC)  Vaginal bleeding  Tachycardia  Dysfunctional uterine hemorrhage    New Prescriptions New Prescriptions   No medications on file   I personally performed the services described in this documentation, which was scribed in my presence. The recorded information has been reviewed and is accurate.     Ezequiel Essex, MD 11/24/16 [phone]

## 2016-11-24 ENCOUNTER — Observation Stay (HOSPITAL_COMMUNITY): Payer: BLUE CROSS/BLUE SHIELD

## 2016-11-24 ENCOUNTER — Emergency Department (HOSPITAL_COMMUNITY): Payer: BLUE CROSS/BLUE SHIELD

## 2016-11-24 ENCOUNTER — Encounter (HOSPITAL_COMMUNITY): Payer: Self-pay | Admitting: Family Medicine

## 2016-11-24 DIAGNOSIS — N939 Abnormal uterine and vaginal bleeding, unspecified: Secondary | ICD-10-CM | POA: Insufficient documentation

## 2016-11-24 DIAGNOSIS — I2699 Other pulmonary embolism without acute cor pulmonale: Secondary | ICD-10-CM | POA: Diagnosis not present

## 2016-11-24 DIAGNOSIS — N938 Other specified abnormal uterine and vaginal bleeding: Secondary | ICD-10-CM | POA: Diagnosis not present

## 2016-11-24 DIAGNOSIS — F411 Generalized anxiety disorder: Secondary | ICD-10-CM | POA: Diagnosis not present

## 2016-11-24 DIAGNOSIS — Z7901 Long term (current) use of anticoagulants: Secondary | ICD-10-CM

## 2016-11-24 DIAGNOSIS — D509 Iron deficiency anemia, unspecified: Secondary | ICD-10-CM | POA: Diagnosis present

## 2016-11-24 DIAGNOSIS — K219 Gastro-esophageal reflux disease without esophagitis: Secondary | ICD-10-CM

## 2016-11-24 DIAGNOSIS — R000 Tachycardia, unspecified: Secondary | ICD-10-CM

## 2016-11-24 LAB — HEMOGLOBIN AND HEMATOCRIT, BLOOD
HCT: 25.8 % — ABNORMAL LOW (ref 36.0–46.0)
HCT: 23.7 % — ABNORMAL LOW (ref 36.0–46.0)
Hemoglobin: 8.1 g/dL — ABNORMAL LOW (ref 12.0–15.0)
Hemoglobin: 7.4 g/dL — ABNORMAL LOW (ref 12.0–15.0)

## 2016-11-24 LAB — BASIC METABOLIC PANEL
Anion gap: 11 (ref 5–15)
BUN: 10 mg/dL (ref 6–20)
CO2: 25 mmol/L (ref 22–32)
Calcium: 9.5 mg/dL (ref 8.9–10.3)
Chloride: 100 mmol/L — ABNORMAL LOW (ref 101–111)
Creatinine, Ser: 0.58 mg/dL (ref 0.44–1.00)
GFR calc Af Amer: >60 mL/min (ref >60)
GFR calc non Af Amer: >60 mL/min (ref >60)
Glucose, Bld: 108 mg/dL — ABNORMAL HIGH (ref 65–99)
Potassium: 3.8 mmol/L (ref 3.5–5.1)
Sodium: 136 mmol/L (ref 135–145)

## 2016-11-24 LAB — GLUCOSE, CAPILLARY
Glucose-Capillary: 145 mg/dL — ABNORMAL HIGH (ref 65–99)
Glucose-Capillary: 124 mg/dL — ABNORMAL HIGH (ref 65–99)

## 2016-11-24 LAB — TROPONIN I: Troponin I: <0.03 ng/mL (ref <0.03)

## 2016-11-24 LAB — BRAIN NATRIURETIC PEPTIDE: B Natriuretic Peptide: 7.1 pg/mL (ref 0.0–100.0)

## 2016-11-24 LAB — RAPID STREP SCREEN (MED CTR MEBANE ONLY): Streptococcus, Group A Screen (Direct): NEGATIVE

## 2016-11-24 MED ORDER — CARBOXYMETHYLCELLULOSE SODIUM 0.5 % OP SOLN: 1.0000 [drp] | Freq: Three times a day (TID) | OPHTHALMIC | Status: DC | PRN

## 2016-11-24 MED ORDER — ONDANSETRON HCL 4 MG PO TABS: 4.0000 mg | ORAL_TABLET | Freq: Four times a day (QID) | ORAL | Status: DC | PRN

## 2016-11-24 MED ORDER — METOPROLOL TARTRATE 5 MG/5ML IV SOLN
5.0000 mg | Freq: Four times a day (QID) | INTRAVENOUS | Status: DC | PRN
  Administered 2016-11-24 – 2016-11-25 (×3): 5 mg via INTRAVENOUS
  Filled 2016-11-24 (×3): qty 5

## 2016-11-24 MED ORDER — INSULIN ASPART 100 UNIT/ML ~~LOC~~ SOLN
0.0000 [IU] | Freq: Every day | SUBCUTANEOUS | Status: DC
Start: 2016-11-24 — End: 2016-11-25

## 2016-11-24 MED ORDER — PANTOPRAZOLE SODIUM 40 MG PO TBEC
40.0000 mg | DELAYED_RELEASE_TABLET | Freq: Every day | ORAL | Status: DC
  Administered 2016-11-24 – 2016-11-28 (×5): 40 mg via ORAL
  Filled 2016-11-24 (×5): qty 1

## 2016-11-24 MED ORDER — ALBUTEROL SULFATE (2.5 MG/3ML) 0.083% IN NEBU
2.5000 mg | INHALATION_SOLUTION | RESPIRATORY_TRACT | Status: DC | PRN
  Administered 2016-11-25: 2.5 mg via RESPIRATORY_TRACT
  Filled 2016-11-24: qty 3

## 2016-11-24 MED ORDER — POLYETHYLENE GLYCOL 3350 17 G PO PACK
17.0000 g | PACK | Freq: Every day | ORAL | Status: DC | PRN
  Administered 2016-11-26 – 2016-11-27 (×2): 17 g via ORAL
  Filled 2016-11-24 (×2): qty 1

## 2016-11-24 MED ORDER — SODIUM CHLORIDE 0.9 % IV SOLN: INTRAVENOUS | Status: DC

## 2016-11-24 MED ORDER — OXYCODONE-ACETAMINOPHEN 5-325 MG PO TABS
1.0000 | ORAL_TABLET | Freq: Four times a day (QID) | ORAL | Status: DC | PRN
  Administered 2016-11-25 – 2016-11-28 (×8): 1 via ORAL
  Filled 2016-11-24 (×8): qty 1

## 2016-11-24 MED ORDER — MONTELUKAST SODIUM 10 MG PO TABS
10.0000 mg | ORAL_TABLET | Freq: Every day | ORAL | Status: DC
  Administered 2016-11-24 – 2016-11-27 (×4): 10 mg via ORAL
  Filled 2016-11-24 (×4): qty 1

## 2016-11-24 MED ORDER — BISACODYL 5 MG PO TBEC
5.0000 mg | DELAYED_RELEASE_TABLET | Freq: Every day | ORAL | Status: DC | PRN
  Administered 2016-11-25 – 2016-11-28 (×3): 5 mg via ORAL
  Filled 2016-11-24 (×3): qty 1

## 2016-11-24 MED ORDER — IOPAMIDOL (ISOVUE-370) INJECTION 76%
INTRAVENOUS | Status: AC
  Administered 2016-11-24: 100 mL
  Filled 2016-11-24: qty 100

## 2016-11-24 MED ORDER — APIXABAN 5 MG PO TABS
10.0000 mg | ORAL_TABLET | Freq: Two times a day (BID) | ORAL | Status: DC
  Administered 2016-11-24 – 2016-11-25 (×3): 10 mg via ORAL
  Filled 2016-11-24 (×3): qty 2

## 2016-11-24 MED ORDER — INSULIN ASPART 100 UNIT/ML ~~LOC~~ SOLN
0.0000 [IU] | Freq: Three times a day (TID) | SUBCUTANEOUS | Status: DC
  Administered 2016-11-24: 1 [IU] via SUBCUTANEOUS

## 2016-11-24 MED ORDER — ALPRAZOLAM 0.5 MG PO TABS
0.5000 mg | ORAL_TABLET | Freq: Two times a day (BID) | ORAL | Status: DC | PRN
  Administered 2016-11-24 (×2): 0.5 mg via ORAL
  Filled 2016-11-24 (×2): qty 1

## 2016-11-24 MED ORDER — BENZONATATE 100 MG PO CAPS
200.0000 mg | ORAL_CAPSULE | Freq: Three times a day (TID) | ORAL | Status: DC | PRN
  Administered 2016-11-24 – 2016-11-27 (×5): 200 mg via ORAL
  Filled 2016-11-24 (×6): qty 2

## 2016-11-24 MED ORDER — ACETAMINOPHEN 325 MG PO TABS: 650.0000 mg | ORAL_TABLET | Freq: Four times a day (QID) | ORAL | Status: DC | PRN

## 2016-11-24 MED ORDER — IBUPROFEN 400 MG PO TABS: 400.0000 mg | ORAL_TABLET | Freq: Four times a day (QID) | ORAL | Status: DC | PRN

## 2016-11-24 MED ORDER — SODIUM CHLORIDE 0.9 % IV BOLUS (SEPSIS)
1000.0000 mL | Freq: Once | INTRAVENOUS | Status: AC
  Administered 2016-11-24: 1000 mL via INTRAVENOUS

## 2016-11-24 MED ORDER — ONDANSETRON HCL 4 MG/2ML IJ SOLN: 4.0000 mg | Freq: Four times a day (QID) | INTRAMUSCULAR | Status: DC | PRN

## 2016-11-24 MED ORDER — LOSARTAN POTASSIUM 25 MG PO TABS
25.0000 mg | ORAL_TABLET | Freq: Every day | ORAL | Status: DC
  Administered 2016-11-24 – 2016-11-26 (×3): 25 mg via ORAL
  Filled 2016-11-24 (×3): qty 1

## 2016-11-24 MED ORDER — HYDROXYZINE HCL 25 MG PO TABS
50.0000 mg | ORAL_TABLET | Freq: Three times a day (TID) | ORAL | Status: DC | PRN
  Administered 2016-11-24: 50 mg via ORAL
  Filled 2016-11-24: qty 2

## 2016-11-24 MED ORDER — FERROUS SULFATE 325 (65 FE) MG PO TABS
325.0000 mg | ORAL_TABLET | Freq: Two times a day (BID) | ORAL | Status: DC
Start: 2016-11-24 — End: 2016-11-28
  Administered 2016-11-24 – 2016-11-28 (×9): 325 mg via ORAL
  Filled 2016-11-24 (×9): qty 1

## 2016-11-24 MED ORDER — GUAIFENESIN 100 MG/5ML PO SOLN
5.0000 mL | ORAL | Status: DC | PRN
  Administered 2016-11-24 – 2016-11-27 (×3): 100 mg via ORAL
  Filled 2016-11-24 (×6): qty 5

## 2016-11-24 MED ORDER — MENTHOL 3 MG MT LOZG
1.0000 | LOZENGE | OROMUCOSAL | Status: DC | PRN
  Administered 2016-11-24 – 2016-11-27 (×3): 3 mg via ORAL
  Filled 2016-11-24 (×2): qty 9

## 2016-11-24 MED ORDER — FAMOTIDINE 20 MG PO TABS
20.0000 mg | ORAL_TABLET | Freq: Every day | ORAL | Status: DC
  Administered 2016-11-24 – 2016-11-27 (×4): 20 mg via ORAL
  Filled 2016-11-24 (×4): qty 1

## 2016-11-24 MED ORDER — HYDROCOD POLST-CPM POLST ER 10-8 MG/5ML PO SUER
5.0000 mL | Freq: Two times a day (BID) | ORAL | Status: DC
  Administered 2016-11-24 – 2016-11-28 (×9): 5 mL via ORAL
  Filled 2016-11-24 (×9): qty 5

## 2016-11-24 MED ORDER — MOMETASONE FURO-FORMOTEROL FUM 200-5 MCG/ACT IN AERO
2.0000 | INHALATION_SPRAY | Freq: Two times a day (BID) | RESPIRATORY_TRACT | Status: DC
  Filled 2016-11-24: qty 8.8

## 2016-11-24 MED ORDER — POLYVINYL ALCOHOL 1.4 % OP SOLN
1.0000 [drp] | Freq: Three times a day (TID) | OPHTHALMIC | Status: DC | PRN
  Administered 2016-11-25: 1 [drp] via OPHTHALMIC
  Filled 2016-11-24: qty 15

## 2016-11-24 MED ORDER — ACETAMINOPHEN 650 MG RE SUPP: 650.0000 mg | Freq: Four times a day (QID) | RECTAL | Status: DC | PRN

## 2016-11-24 MED ORDER — SODIUM CHLORIDE 0.9% FLUSH
3.0000 mL | Freq: Two times a day (BID) | INTRAVENOUS | Status: DC
  Administered 2016-11-24 – 2016-11-28 (×6): 3 mL via INTRAVENOUS

## 2016-11-24 MED ORDER — LATANOPROST 0.005 % OP SOLN
1.0000 [drp] | Freq: Every day | OPHTHALMIC | Status: DC
  Administered 2016-11-24 – 2016-11-27 (×4): 1 [drp] via OPHTHALMIC
  Filled 2016-11-24 (×2): qty 2.5

## 2016-11-24 MED ORDER — FLUTICASONE PROPIONATE 50 MCG/ACT NA SUSP
2.0000 | Freq: Every day | NASAL | Status: DC
  Administered 2016-11-24 – 2016-11-28 (×5): 2 via NASAL
  Filled 2016-11-24 (×2): qty 16

## 2016-11-24 MED ORDER — APIXABAN 5 MG PO TABS: 5.0000 mg | ORAL_TABLET | Freq: Two times a day (BID) | ORAL | Status: DC

## 2016-11-24 MED ORDER — PRAVASTATIN SODIUM 40 MG PO TABS
40.0000 mg | ORAL_TABLET | Freq: Every day | ORAL | Status: DC
  Administered 2016-11-24 – 2016-11-28 (×5): 40 mg via ORAL
  Filled 2016-11-24 (×5): qty 1

## 2016-11-24 MED ORDER — SODIUM CHLORIDE 0.9 % IV SOLN: INTRAVENOUS | Status: AC

## 2016-11-24 NOTE — ED Notes (Signed)
RN called ultrasound and informed them she is ready to go.

## 2016-11-24 NOTE — Progress Notes (Signed)
Patient arrived to 2W14.  Telemetry monitor applied and CCMD notified.  Patient oriented to unit and room to include call light and phone.  Will continue to monitor.

## 2016-11-24 NOTE — Progress Notes (Addendum)
Patient ID: Jennifer Mullins, female   DOB: June 22, 1971, 46 y.o.   MRN: [id number]  Patient admitted after midnight. For details, please refer to admission note done 11/24/2016.  46 y.o. female with medical history significant for hypertension, type 2 diabetes mellitus, COPD, generalized anxiety disorder, uterine fibroids, and recent postoperative pulmonary embolism treated with IV heparin initially and then started on Eliquis on 11/22/2016. Patient presented to ED with heavy vaginal bleeding shortly after started on anticoagulation. She reported a history of uterine fibroids and follows with gynecology in [place]. Patient also reported increase in cough productive of clear sputum. No fevers or chills. She has chest pain with coughing.  In ED, patient was afebrile with good oxygen saturation. She was tachycardic with heart rate in 120s. The 12-lead EKG showed sinus tachycardia. Chest x-ray was notable for small left pleural effusion with lingular opacity likely representing an infarct given the known PE in that area. CBC was notable for hemoglobin of 9.3 and MCV of 77.2. Troponin was unremarkable and BNP was within normal limits. 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 There is progressive lingular infarction is noted as well.  Assessment and plan:  Acute blood loss anemia from heavy vaginal bleeding / dysfunctional uterine bleed - Likely in the setting of anticoagulation - Hemoglobin remains stable at 9.3 - Anticoagulation continued - We will continue to monitor for next 24 hours  Leisa Lenz [facility] [id number]

## 2016-11-24 NOTE — Discharge Instructions (Addendum)
Apixaban oral tablets °What is this medicine? °APIXABAN (a PIX a ban) is an anticoagulant (blood thinner). It is used to lower the chance of stroke in people with a medical condition called atrial fibrillation. It is also used to treat or prevent blood clots in the lungs or in the veins. °COMMON BRAND NAME(S): Eliquis °What should I tell my health care provider before I take this medicine? °They need to know if you have any of these conditions: °-bleeding disorders °-bleeding in the brain °-blood in your stools (black or tarry stools) or if you have blood in your vomit °-history of stomach bleeding °-kidney disease °-liver disease °-mechanical heart valve °-an unusual or allergic reaction to apixaban, other medicines, foods, dyes, or preservatives °-pregnant or trying to get pregnant °-breast-feeding °How should I use this medicine? °Take this medicine by mouth with a glass of water. Follow the directions on the prescription label. You can take it with or without food. If it upsets your stomach, take it with food. Take your medicine at regular intervals. Do not take it more often than directed. Do not stop taking except on your doctor's advice. Stopping this medicine may increase your risk of a blot clot. Be sure to refill your prescription before you run out of medicine. °Talk to your pediatrician regarding the use of this medicine in children. Special care may be needed. °What if I miss a dose? °If you miss a dose, take it as soon as you can. If it is almost time for your next dose, take only that dose. Do not take double or extra doses. °What may interact with this medicine? °This medicine may interact with the following: °-aspirin and aspirin-like medicines °-certain medicines for fungal infections like ketoconazole and itraconazole °-certain medicines for seizures like carbamazepine and phenytoin °-certain medicines that treat or prevent blood clots like warfarin, enoxaparin, and  dalteparin °-clarithromycin °-NSAIDs, medicines for pain and inflammation, like ibuprofen or naproxen °-rifampin °-ritonavir °-St. John's wort °What should I watch for while using this medicine? °Notify your doctor or health care professional and seek emergency treatment if you develop breathing problems; changes in vision; chest pain; severe, sudden headache; pain, swelling, warmth in the leg; trouble speaking; sudden numbness or weakness of the face, arm, or leg. These can be signs that your condition has gotten worse. °If you are going to have surgery, tell your doctor or health care professional that you are taking this medicine. °Tell your health care professional that you use this medicine before you have a spinal or epidural procedure. Sometimes people who take this medicine have bleeding problems around the spine when they have a spinal or epidural procedure. This bleeding is very rare. If you have a spinal or epidural procedure while on this medicine, call your health care professional immediately if you have back pain, numbness or tingling (especially in your legs and feet), muscle weakness, paralysis, or loss of bladder or bowel control. °Avoid sports and activities that might cause injury while you are using this medicine. Severe falls or injuries can cause unseen bleeding. Be careful when using sharp tools or knives. Consider using an electric razor. Take special care brushing or flossing your teeth. Report any injuries, bruising, or red spots on the skin to your doctor or health care professional. °What side effects may I notice from receiving this medicine? °Side effects that you should report to your doctor or health care professional as soon as possible: °-allergic reactions like skin rash, itching or hives, swelling   of the face, lips, or tongue °-signs and symptoms of bleeding such as bloody or black, tarry stools; red or dark-brown urine; spitting up blood or brown material that looks like coffee  grounds; red spots on the skin; unusual bruising or bleeding from the eye, gums, or nose °Where should I keep my medicine? °Keep out of the reach of children. °Store at room temperature between 20 and 25 degrees C (68 and 77 degrees F). Throw away any unused medicine after the expiration date. °© 2017 [employer] (2015-12-07 09:26:49) ° °

## 2016-11-24 NOTE — ED Notes (Signed)
Pt transported to CT ?

## 2016-11-24 NOTE — ED Notes (Signed)
Pt very anxious and emotional about having to much bleeding, pt is on her period and is bleeding constantly, pt very scare about her blood levels. Emotional support given to pt. EDP to be notified.

## 2016-11-24 NOTE — ED Notes (Signed)
Pt called out reporting that her nose was bleeding, RN observed scant amount of blood.  She was give a washcloth to hold pressure over her nose.  She initially refused to go to ultrasound but has changed her mind and now wants to go.

## 2016-11-24 NOTE — ED Notes (Signed)
Pt had a bowel movement. There were 2 quarter size blood clots in with the large greenish bowel.

## 2016-11-24 NOTE — Progress Notes (Signed)
Patient noted to have severe coughing fits whenever she eats or lays down.

## 2016-11-24 NOTE — H&P (Signed)
History and Physical    Jennifer Mullins [id number] DOB: 26-Dec-1970 DOA: 11/23/2016  PCP: Chevis Pretty, FNP   Patient coming from: Home  Chief Complaint: Dyspnea, cough, hemoptysis, vaginal bleeding   HPI: Jennifer Mullins is a 46 y.o. female with medical history significant for hypertension, type 2 diabetes mellitus, COPD, generalized anxiety disorder, uterine fibroids, and recent postoperative pulmonary embolism treated with IV heparin initially and then started on Eliquis on 11/22/2016, now returning to the emergency department with increasing dyspnea, cough, hemoptysis, and heavy vaginal bleeding. Patient reports that she developed vaginal bleeding shortly after started on anticoagulation and has been soaking through multiple pads each day. She reports a history of uterine fibroids and follows with gynecology in [place]. She denies pelvic pain, denies lightheadedness, chest pain, palpitations, headache, change in vision or hearing, or focal numbness or weakness. She reports strict adherence with her Eliquis. Increase in her cough, productive of clear sputum and sometimes blood, but denies fevers, chills, lower extremity edema, or orthopnea. She has pleuritic pain at the left lateral chest wall, but denies any exertional chest pain.  ED Course: Upon arrival to the ED, patient is found to be afebrile, saturating adequately on room air, tachycardic in the 120s, and with vitals otherwise stable. EKG features a sinus tachycardia with rate 118. Chest x-ray is notable for a small left pleural effusion with a lingular opacity likely representing an infarct given the known PE there. Chemistry panel was unremarkable and CBC is notable for stable crescentic anemia with hemoglobin of 9.3 and MCV of 77.2. BNP is within the normal limits and troponin is undetectable. CTA chest was repeated in the emergency department and demonstrates persistent occlusion of the left upper lobe lingular branch pulmonary artery  without new PE. Progressive lingular infarction is also noted on the CTA. She was treated with 1 L of normal saline in the emergency department with improvement in the tachycardia. She reported soaking through 3 pads while in the ED, but has refused pelvic exam. She remains mildly tachycardic with stable blood pressure and will be observed on the telemetry unit for ongoing evaluation and management of heavy vaginal bleeding on anticoagulation for acute PE.  Review of Systems:  All other systems reviewed and apart from HPI, are negative.  Past Medical History:  Diagnosis Date  . Anemia   . Anxiety   . Arthritis   . Asthma   . Bilateral calcaneal spurs   . Bilateral polycystic ovarian syndrome   . Bulging lumbar disc   . Carpal tunnel syndrome, bilateral   . COPD (chronic obstructive pulmonary disease) (HCC)    BRONCHITIS  . Diabetes mellitus without complication ([place])    TYPE 2   DX  4-5 YRS AGO  . Fibroids    uterine  . GAD (generalized anxiety disorder)   . GERD (gastroesophageal reflux disease)    TAKES PRESCRIPTION MEDS  . Glaucoma    BOTH EYES  . Hyperlipidemia   . Hypertension   . Hypothyroidism    NODULES ON THYROID  . PONV (postoperative nausea and vomiting)   . Pulmonary embolism ([place])   . Tachycardia     Past Surgical History:  Procedure Laterality Date  . ANTERIOR CERVICAL DECOMP/DISCECTOMY FUSION N/A 11/13/2016   Procedure: Cervical five-six, Cervical six-seven Anterior Cervical Discectomy and Fusion, Allograft, Plate;  Surgeon: Marybelle Killings, MD;  Location: [facility];  Service: Orthopedics;  Laterality: N/A;  . BREAST SURGERY    . CARPAL TUNNEL RELEASE    .  DILATION AND CURETTAGE OF UTERUS    . EXPLORATORY LAPAROTOMY    . NASAL SINUS SURGERY  07/22/13   Dr. Redmond Pulling in [place]     reports that she quit smoking about 27 years ago. Her smoking use included Cigarettes. She has a 1.25 pack-year smoking history. She has never used smokeless tobacco. She reports that she  drinks alcohol. She reports that she does not use drugs.  Allergies  Allergen Reactions  . Demerol [Meperidine] Shortness Of Breath    FLUSHING AND SHORTNESS OF BREATH  . Morphine And Related Shortness Of Breath    Flushed and hot hyper  . Diltiazem Hcl Hives    Family History  Problem Relation Age of Onset  . Hypertension Mother   . Hypertension Father      Prior to Admission medications   Medication Sig Start Date End Date Taking? Authorizing Provider  albuterol (PROVENTIL HFA;VENTOLIN HFA) 108 (90 Base) MCG/ACT inhaler Inhale 1-2 puffs into the lungs daily as needed. Rescue inhaler Patient taking differently: Inhale 1-2 puffs into the lungs daily as needed for wheezing or shortness of breath. Rescue inhaler 11/04/16  Yes Mary-Margaret Hassell Done, FNP  ALPRAZolam Duanne Moron) 0.5 MG tablet Take 1 tablet (0.5 mg total) by mouth 2 (two) times daily as needed. Patient taking differently: Take 0.5 mg by mouth 2 (two) times daily.  10/04/16  Yes Mary-Margaret Hassell Done, FNP  apixaban (ELIQUIS) 5 MG TABS tablet Eliquis 10 mg po BID x 6 days then on Thursday January 11th, dose decreases to Eliquis 5 mg po BID 11/22/16  Yes Silver Huguenin Elgergawy, MD  benzonatate (TESSALON) 200 MG capsule Take 200 mg by mouth 3 (three) times daily as needed for cough.   Yes Historical Provider, MD  bimatoprost (LUMIGAN) 0.01 % SOLN Place 1 drop into both eyes at bedtime.    Yes Historical Provider, MD  budesonide-formoterol (SYMBICORT) 160-4.5 MCG/ACT inhaler Inhale 2 puffs into the lungs 2 (two) times daily.   Yes Historical Provider, MD  carboxymethylcellulose (REFRESH PLUS) 0.5 % SOLN Place 1 drop into both eyes 3 (three) times daily as needed (dryness).    Yes Historical Provider, MD  cyproheptadine (PERIACTIN) 4 MG tablet Take 1 tablet (4 mg total) by mouth at bedtime. Patient taking differently: Take 4 mg by mouth at bedtime as needed for allergies.  11/04/16  Yes Mary-Margaret Hassell Done, FNP  famotidine (PEPCID) 20 MG  tablet Take 1 tablet (20 mg total) by mouth at bedtime. 10/04/16  Yes Mary-Margaret Hassell Done, FNP  ferrous sulfate 325 (65 FE) MG tablet Take 1 tablet (325 mg total) by mouth 2 (two) times daily with a meal. 11/22/16  Yes Silver Huguenin Elgergawy, MD  fluticasone (FLONASE) 50 MCG/ACT nasal spray Place 2 sprays into both nostrils daily.   Yes Historical Provider, MD  furosemide (LASIX) 20 MG tablet Take 1 tablet (20 mg total) by mouth daily. 10/04/16  Yes Mary-Margaret Hassell Done, FNP  hydrOXYzine (ATARAX/VISTARIL) 50 MG tablet Take 1 tablet (50 mg total) by mouth 3 (three) times daily as needed for itching. 11/04/16  Yes Mary-Margaret Hassell Done, FNP  ipratropium (ATROVENT) 0.03 % nasal spray Place 2 sprays into both nostrils every 12 (twelve) hours. Patient taking differently: Place 2 sprays into both nostrils every 12 (twelve) hours as needed for rhinitis.  08/09/15  Yes Mary-Margaret Hassell Done, FNP  losartan (COZAAR) 25 MG tablet Take 1 tablet (25 mg total) by mouth daily. 11/22/16  Yes Albertine Patricia, MD  metFORMIN (GLUCOPHAGE) 500 MG tablet Take 1 tablet (500  mg total) by mouth 2 (two) times daily with a meal. Patient taking differently: Take 500 mg by mouth 2 (two) times daily as needed (depending on sugar).  10/04/16  Yes Mary-Margaret Hassell Done, FNP  montelukast (SINGULAIR) 10 MG tablet TAKE 1 TABLET BY MOUTH AT BEDTIME 10/31/16  Yes Mary-Margaret Hassell Done, FNP  Olopatadine HCl 0.6 % SOLN Place 1 drop (1 puff total) into the nose every morning. Patient taking differently: Place 1 drop into both eyes daily as needed (alergies).  11/04/16  Yes Mary-Margaret Hassell Done, FNP  oxyCODONE-acetaminophen (PERCOCET/ROXICET) 5-325 MG tablet Take 1 tablet by mouth every 6 (six) hours as needed for moderate pain. 11/22/16  Yes Silver Huguenin Elgergawy, MD  pantoprazole (PROTONIX) 40 MG tablet Take 1 tablet (40 mg total) by mouth daily. 10/04/16  Yes Mary-Margaret Hassell Done, FNP  polyethylene glycol (MIRALAX / GLYCOLAX) packet Take 17 g by mouth  daily as needed for mild constipation. 11/22/16  Yes Albertine Patricia, MD  pravastatin (PRAVACHOL) 40 MG tablet Take 1 tablet (40 mg total) by mouth daily. 10/04/16  Yes Mary-Margaret Hassell Done, FNP  pseudoephedrine-guaifenesin (MUCINEX D) 60-600 MG 12 hr tablet Take 1 tablet by mouth every 12 (twelve) hours as needed for congestion.    Yes Historical Provider, MD    Physical Exam: Vitals:   11/23/16 2300 11/23/16 2351 11/24/16 0015 11/24/16 0245  BP: 130/87 134/84 122/90 133/86  Pulse: 112 113 116 118  Resp: 18 19 20    Temp:      TempSrc:      SpO2: 98% 100% 100% 98%  Weight:      Height:          Constitutional: No acute respiratory distress, no pallor or diaphoresis Eyes: PERTLA, lids and conjunctivae normal ENMT: Mucous membranes are moist. Posterior pharynx clear of any exudate or lesions.   Neck: Anterior surgical incision clean/dry/intact. No masses.  Respiratory: breath sounds diminished in bilateral bases, no rhonchi or wheeze. Normal respiratory effort. No accessory muscle use.  Cardiovascular: Rate ~120 and regular. No extremity edema. No significant JVD. Abdomen: No distension, no tenderness, no masses palpated. Bowel sounds normal.  Pelvic: Pt refuses Musculoskeletal: no clubbing / cyanosis. No joint deformity upper and lower extremities. Normal muscle tone.  Skin: no significant rashes, lesions, ulcers. Warm, dry, well-perfused. Neurologic: CN 2-12 grossly intact. Sensation intact, DTR normal. Strength 5/5 in all 4 limbs.  Psychiatric: Normal judgment and insight. Alert and oriented x 3. Normal mood and affect.     Labs on Admission: I have personally reviewed following labs and imaging studies  CBC:  Recent Labs Lab 11/19/16 0851 11/20/16 0256 11/21/16 0719 11/22/16 0632 11/23/16 2324  WBC 8.0 6.4 6.0 5.5 7.8  NEUTROABS 6.2  --   --   --  5.6  HGB 9.5* 8.6* 8.3* 8.4* 9.3*  HCT 30.1* 27.7* 27.1* 27.0* 29.2*  MCV 79.8 77.8* 79.9 77.4* 77.2*  PLT 280 273  278 301 [id number]*   Basic Metabolic Panel:  Recent Labs Lab 11/19/16 0851 11/20/16 0256 11/21/16 0719 11/22/16 0632 11/23/16 2324  NA 134* 135 136 137 136  K 3.8 3.8 4.1 4.1 3.8  CL 100* 101 104 102 100*  CO2 27 25 24 27 25   GLUCOSE 107* 115* 125* 97 108*  BUN 8 8 9 8 10   CREATININE 0.58 0.78 0.60 0.62 0.58  CALCIUM 9.4 8.7* 9.0 9.0 9.5   GFR: Estimated Creatinine Clearance: 75.3 mL/min (by C-G formula based on SCr of 0.58 mg/dL). Liver Function Tests:  Recent  Labs Lab 11/19/16 0851  AST 12*  ALT 12*  ALKPHOS 60  BILITOT 0.3  PROT 7.9  ALBUMIN 3.8   No results for input(s): LIPASE, AMYLASE in the last 168 hours. No results for input(s): AMMONIA in the last 168 hours. Coagulation Profile:  Recent Labs Lab 11/19/16 1214 11/23/16 2324  INR 0.97 1.26   Cardiac Enzymes:  Recent Labs Lab 11/19/16 0851 11/23/16 2324  TROPONINI <0.03 <0.03   BNP (last 3 results) No results for input(s): PROBNP in the last 8760 hours. HbA1C: No results for input(s): HGBA1C in the last 72 hours. CBG:  Recent Labs Lab 11/21/16 1152 11/21/16 1707 11/21/16 2124 11/22/16 0745 11/22/16 1148  GLUCAP 91 89 107* 125* 87   Lipid Profile: No results for input(s): CHOL, HDL, LDLCALC, TRIG, CHOLHDL, LDLDIRECT in the last 72 hours. Thyroid Function Tests: No results for input(s): TSH, T4TOTAL, FREET4, T3FREE, THYROIDAB in the last 72 hours. Anemia Panel: No results for input(s): VITAMINB12, FOLATE, FERRITIN, TIBC, IRON, RETICCTPCT in the last 72 hours. Urine analysis:    Component Value Date/Time   COLORURINE YELLOW 11/07/2016 [place] 11/07/2016 1014   LABSPEC 1.012 11/07/2016 1014   PHURINE 6.0 11/07/2016 1014   GLUCOSEU NEGATIVE 11/07/2016 1014   HGBUR MODERATE (A) 11/07/2016 1014   BILIRUBINUR NEGATIVE 11/07/2016 1014   BILIRUBINUR neg 10/31/2014 1347   KETONESUR NEGATIVE 11/07/2016 1014   PROTEINUR NEGATIVE 11/07/2016 1014   UROBILINOGEN negative  10/31/2014 1347   NITRITE NEGATIVE 11/07/2016 1014   LEUKOCYTESUR NEGATIVE 11/07/2016 1014   Sepsis Labs: @LABRCNTIP (procalcitonin:4,lacticidven:4) ) Recent Results (from the past 240 hour(s))  MRSA PCR Screening     Status: None   Collection Time: 11/19/16  9:44 PM  Result Value Ref Range Status   MRSA by PCR NEGATIVE NEGATIVE Final    Comment:        The GeneXpert MRSA Assay (FDA approved for NASAL specimens only), is one component of a comprehensive MRSA colonization surveillance program. It is not intended to diagnose MRSA infection nor to guide or monitor treatment for MRSA infections.   Rapid strep screen     Status: None   Collection Time: 11/24/16  3:40 AM  Result Value Ref Range Status   Streptococcus, Group A Screen (Direct) NEGATIVE NEGATIVE Final    Comment: (NOTE) A Rapid Antigen test may result negative if the antigen level in the sample is below the detection level of this test. The FDA has not cleared this test as a stand-alone test therefore the rapid antigen negative result has reflexed to a Group A Strep culture.      Radiological Exams on Admission: Dg Chest 2 View  Result Date: 11/24/2016 CLINICAL DATA:  Dyspnea. Recent hospitalization for pulmonary embolism. Hypertension. EXAM: CHEST  2 VIEW COMPARISON:  11/19/2016 FINDINGS: Persistent lingular opacity and small left pleural effusion. This may represent pulmonary infarction from the known emboli. Right lung is clear. Pulmonary vasculature is normal. Hilar and mediastinal contours are unremarkable. IMPRESSION: Small left effusion. Persistent lingular airspace opacity, possibly pulmonary infarction given the known recent emboli. Electronically Signed   By: Andreas Newport M.D.   On: 11/24/2016 00:56   Ct Angio Chest Pe W And/or Wo Contrast  Result Date: 11/24/2016 CLINICAL DATA:  46 y/o F; shortness of breath and history of pulmonary embolus. EXAM: CT ANGIOGRAPHY CHEST WITH CONTRAST TECHNIQUE:  Multidetector CT imaging of the chest was performed using the standard protocol during bolus administration of intravenous contrast. Multiplanar CT image reconstructions  and MIPs were obtained to evaluate the vascular anatomy. CONTRAST:  100 cc Isovue 370 COMPARISON:  11/19/2016 CT angiogram of the chest. FINDINGS: Cardiovascular: Respiratory motion artifact. Satisfactory opacification of the pulmonary arteries. There is persistent occlusion of the left upper lobe lingular branch pulmonary artery. No new pulmonary embolus is identified. Normal heart size.  No pericardial effusion. Mediastinum/Nodes: No enlarged mediastinal, hilar, or axillary lymph nodes. Thyroid gland, trachea, and esophagus demonstrate no significant findings. Lungs/Pleura: Progressive opacification of the lingula likely represents worsening pulmonary infarct. No new airspace consolidation is identified. Trace left pleural effusion. Upper Abdomen: No acute abnormality. Musculoskeletal: No chest wall abnormality. No acute or significant osseous findings. Review of the MIP images confirms the above findings. IMPRESSION: 1. 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 2. Extensive respiratory motion artifact. No new pulmonary embolus identified. 3. 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 No new airspace opacity. These results were called by telephone at the time of interpretation on 11/24/2016 at 3:04 am to Dr. Ezequiel Essex , who verbally acknowledged these results. 1. Stable trace left pleural effusion. Electronically Signed   By: Kristine Garbe M.D.   On: 11/24/2016 03:06    EKG: Independently reviewed. Sinus tachycardia (rate 118)   Assessment/Plan  1. Dysfunctional uterine bleeding  - Pt reports hx of fibroids, follows with gynecologist Dr. Gari Crown - She has been bleeding heavily since starting anticoagulation and reports soaking through a pad per hr in ED - She is tachycardic,  likely secondary to PE, but rate faster than when initially managed for the PE; BP has remained stable  - Hgb is 9.3 on admission, stable relative to recent priors  - She refused pelvic exam in ED; transvaginal US ordered  - She was given a 1 L NS bolus in ED and is continued on gentle IVF hydration  - Follow serial H&H, type & screen    2. PE and LUE DVT on Eliquis - Pt developed postoperative LUE DVT and lingular PE  - Started on IV heparin infusion on 11/19/16, discharged home with Eliquis on 11/21/16 - She reports worse SOB and pleuritic pain with new hemoptysis  - CTA was repeated in ED and stable with no new PE  - Sxs likely secondary to evolving infarction  - Continuing Eliquis, treating pain    3. Microcytic anemia  - Hgb is stable at 9.3 on admission despite reports of profuse vaginal bleeding  - Has been attributed to iron-deficiency and she is treated with oral supplements, will continue  - Evaluating the acute bleeding with serial H&H, transvaginal US as above    4. GERD  - No EGD report on file - Managed at home with Pepcid and Protonix, will continue    5. Hypertension  - At goal here   - Managed with losartan at home, will continue as tolerated    6. Generalized anxiety disorder - Pt is quite anxious on admission given the current illness  - Continue her prn Xanax    7. COPD - Stable on admission with no wheezing of distress  - Continue inhaled ICS/LABA with Dulera, continue prn albuterol   8. Type II DM  - A1c was only 5.4% in January 2018  - She is on metformin at home; this is held on admission  - Check CBG with meals and qHS - Start a low-intensity SSI correctional      DVT prophylaxis: Eliquis Code Status: Full  Family Communication: Discussed  with patient Disposition Plan: Observe on telemetry Consults called: None Admission status: Observation    Vianne Bulls, MD [facility] Pager [phone]  If 7PM-7AM, please contact  night-coverage [email or web address] Password [email or web address]  11/24/2016, 4:45 AM

## 2016-11-24 NOTE — ED Notes (Signed)
Pt given ice pack to put on hematoma due to blood draw

## 2016-11-24 NOTE — ED Notes (Addendum)
Pt refusing to allow this RN to start IV fluids. Pt is unpleasant and uncooperative.   Pt refusing blood pressure measurements.

## 2016-11-25 ENCOUNTER — Telehealth: Payer: Self-pay | Admitting: Nurse Practitioner

## 2016-11-25 ENCOUNTER — Ambulatory Visit: Payer: BLUE CROSS/BLUE SHIELD | Admitting: Nurse Practitioner

## 2016-11-25 DIAGNOSIS — Z79899 Other long term (current) drug therapy: Secondary | ICD-10-CM | POA: Diagnosis not present

## 2016-11-25 DIAGNOSIS — N938 Other specified abnormal uterine and vaginal bleeding: Secondary | ICD-10-CM | POA: Diagnosis present

## 2016-11-25 DIAGNOSIS — D259 Leiomyoma of uterus, unspecified: Secondary | ICD-10-CM | POA: Diagnosis present

## 2016-11-25 DIAGNOSIS — I2699 Other pulmonary embolism without acute cor pulmonale: Secondary | ICD-10-CM

## 2016-11-25 DIAGNOSIS — E039 Hypothyroidism, unspecified: Secondary | ICD-10-CM | POA: Diagnosis present

## 2016-11-25 DIAGNOSIS — R000 Tachycardia, unspecified: Secondary | ICD-10-CM | POA: Diagnosis present

## 2016-11-25 DIAGNOSIS — K219 Gastro-esophageal reflux disease without esophagitis: Secondary | ICD-10-CM | POA: Diagnosis present

## 2016-11-25 DIAGNOSIS — Z87891 Personal history of nicotine dependence: Secondary | ICD-10-CM | POA: Diagnosis not present

## 2016-11-25 DIAGNOSIS — Z7901 Long term (current) use of anticoagulants: Secondary | ICD-10-CM | POA: Diagnosis not present

## 2016-11-25 DIAGNOSIS — E785 Hyperlipidemia, unspecified: Secondary | ICD-10-CM | POA: Diagnosis present

## 2016-11-25 DIAGNOSIS — D62 Acute posthemorrhagic anemia: Secondary | ICD-10-CM | POA: Diagnosis present

## 2016-11-25 DIAGNOSIS — I10 Essential (primary) hypertension: Secondary | ICD-10-CM

## 2016-11-25 DIAGNOSIS — J440 Chronic obstructive pulmonary disease with acute lower respiratory infection: Secondary | ICD-10-CM | POA: Diagnosis present

## 2016-11-25 DIAGNOSIS — H409 Unspecified glaucoma: Secondary | ICD-10-CM | POA: Diagnosis present

## 2016-11-25 DIAGNOSIS — N939 Abnormal uterine and vaginal bleeding, unspecified: Secondary | ICD-10-CM | POA: Diagnosis present

## 2016-11-25 DIAGNOSIS — F411 Generalized anxiety disorder: Secondary | ICD-10-CM | POA: Diagnosis present

## 2016-11-25 DIAGNOSIS — E119 Type 2 diabetes mellitus without complications: Secondary | ICD-10-CM | POA: Diagnosis present

## 2016-11-25 DIAGNOSIS — Z885 Allergy status to narcotic agent status: Secondary | ICD-10-CM | POA: Diagnosis not present

## 2016-11-25 DIAGNOSIS — Z888 Allergy status to other drugs, medicaments and biological substances status: Secondary | ICD-10-CM | POA: Diagnosis not present

## 2016-11-25 LAB — HEMOGLOBIN AND HEMATOCRIT, BLOOD
HCT: 23 % — ABNORMAL LOW (ref 36.0–46.0)
HCT: 26.1 % — ABNORMAL LOW (ref 36.0–46.0)
Hemoglobin: 7.2 g/dL — ABNORMAL LOW (ref 12.0–15.0)
Hemoglobin: 8 g/dL — ABNORMAL LOW (ref 12.0–15.0)

## 2016-11-25 LAB — GLUCOSE, CAPILLARY
Glucose-Capillary: 150 mg/dL — ABNORMAL HIGH (ref 65–99)
Glucose-Capillary: 165 mg/dL — ABNORMAL HIGH (ref 65–99)
Glucose-Capillary: 72 mg/dL (ref 65–99)
Glucose-Capillary: 99 mg/dL (ref 65–99)

## 2016-11-25 LAB — CBC
HCT: 23.5 % — ABNORMAL LOW (ref 36.0–46.0)
Hemoglobin: 7.3 g/dL — ABNORMAL LOW (ref 12.0–15.0)
MCH: 24.4 pg — ABNORMAL LOW (ref 26.0–34.0)
MCHC: 31.1 g/dL (ref 30.0–36.0)
MCV: 78.6 fL (ref 78.0–100.0)
Platelets: 300 10*3/uL (ref 150–400)
RBC: 2.99 MIL/uL — ABNORMAL LOW (ref 3.87–5.11)
RDW: 19.4 % — ABNORMAL HIGH (ref 11.5–15.5)
WBC: 5.1 10*3/uL (ref 4.0–10.5)

## 2016-11-25 MED ORDER — APIXABAN 5 MG PO TABS: 5.0000 mg | ORAL_TABLET | Freq: Two times a day (BID) | ORAL | Status: DC

## 2016-11-25 MED ORDER — FUROSEMIDE 20 MG PO TABS
20.0000 mg | ORAL_TABLET | Freq: Every day | ORAL | Status: DC
  Administered 2016-11-25 – 2016-11-28 (×4): 20 mg via ORAL
  Filled 2016-11-25 (×4): qty 1

## 2016-11-25 MED ORDER — APIXABAN 2.5 MG PO TABS: 2.5000 mg | ORAL_TABLET | Freq: Two times a day (BID) | ORAL | Status: DC

## 2016-11-25 MED ORDER — SALINE SPRAY 0.65 % NA SOLN
1.0000 | NASAL | Status: DC | PRN
  Administered 2016-11-25 – 2016-11-27 (×3): 1 via NASAL
  Filled 2016-11-25: qty 44

## 2016-11-25 MED ORDER — HEPARIN (PORCINE) IN NACL 100-0.45 UNIT/ML-% IJ SOLN: 1200.0000 [IU]/h | INTRAMUSCULAR | Status: DC

## 2016-11-25 MED ORDER — LEVALBUTEROL HCL 1.25 MG/0.5ML IN NEBU
1.2500 mg | INHALATION_SOLUTION | RESPIRATORY_TRACT | Status: DC | PRN
  Administered 2016-11-25 – 2016-11-28 (×4): 1.25 mg via RESPIRATORY_TRACT
  Filled 2016-11-25 (×4): qty 0.5

## 2016-11-25 MED ORDER — ALPRAZOLAM 0.5 MG PO TABS
0.5000 mg | ORAL_TABLET | Freq: Two times a day (BID) | ORAL | Status: DC
  Administered 2016-11-25 – 2016-11-26 (×3): 0.5 mg via ORAL
  Filled 2016-11-25 (×3): qty 1

## 2016-11-25 MED ORDER — IPRATROPIUM BROMIDE 0.03 % NA SOLN
2.0000 | Freq: Two times a day (BID) | NASAL | Status: DC
  Administered 2016-11-25 – 2016-11-28 (×7): 2 via NASAL
  Filled 2016-11-25: qty 30

## 2016-11-25 MED ORDER — METFORMIN HCL 500 MG PO TABS: 500.0000 mg | ORAL_TABLET | Freq: Two times a day (BID) | ORAL | Status: DC | PRN

## 2016-11-25 MED ORDER — APIXABAN 5 MG PO TABS
5.0000 mg | ORAL_TABLET | Freq: Two times a day (BID) | ORAL | Status: DC
  Administered 2016-11-25 – 2016-11-28 (×6): 5 mg via ORAL
  Filled 2016-11-25 (×6): qty 1

## 2016-11-25 NOTE — Telephone Encounter (Signed)
Pt is currently in ED and will schedule a follow up after discharge.

## 2016-11-25 NOTE — Progress Notes (Signed)
Patient ID: Jennifer Mullins, female   DOB: [birth year], 46 y.o.   MRN: [id number]  PROGRESS NOTE    Jennifer LICANO  [id number] DOB: 20-Mar-1971 DOA: 11/23/2016  PCP: Chevis Pretty, FNP   Brief Narrative:  46 y.o.femalewith medical history significant for hypertension, type 2 diabetes mellitus, COPD, generalized anxiety disorder, uterine fibroids, and recent postoperative pulmonary embolism treated with IV heparin initially and then started on Eliquison 11/22/2016. Patient presented to ED with heavy vaginal bleeding shortly after started on anticoagulation. She reported a history of uterine fibroids [place] Patient also reported increase in cough productive of clear sputum. No fevers or chills. She has chest pain with coughing.  In ED, patient was afebrile with good oxygen saturation. She was tachycardic with heart rate in 120s. The 12-lead EKG showed sinus tachycardia. Chest x-ray was notable for small left pleural effusion with lingular opacity likely representing an infarct given the known PE in that area. CBC was notable for hemoglobin of 9.3 and MCV of 77.2. Troponin was unremarkable and BNP was within normal limits. 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 There is progressive lingular infarction is noted as well.  Assessment & Plan:  Acute blood loss anemia from heavy vaginal bleeding / dysfunctional uterine bleed / hemoptysis  - Likely in the setting of anticoagulation - Hemoglobin 8 this am - She still has vaginal bleeding and now hemoptysis although speckles of blood in tissue when coughing  - Anticoagulation continued but at the lower dose of 5 mg BID; spoke with pulmonary and heparin drip would be better should bleeding not stop  - Continue to monitor for bleeding    Pulmonary embolism - Heparin drip for next 24 hours so we can assess if she is  still bleeding or not - If bleeding better she can then be switched to eliquis  - Added xopenex nebulizer as needed for shortness of breath or wheezing - Refused dulera   Essential hypertension - Continue metoprolol, losartan and lasix  Dyslipidemia - Continue pravastatin   Diabetes mellitus without complications without long term insulin use - Continue metformin - Pt refused to have insulin sliding scale    DVT prophylaxis: on AC with eliquis  Code Status: full code  Family Communication: friend at the bedside this am Disposition Plan: home once bleeding resolves.   Consultants:   Pulmonary - phone call only   Procedures:   None   Antimicrobials:   None    Subjective: Still some vaginal bleeding but better. Also, small amount of hemoptysis.   Objective: Vitals:   11/24/16 1257 11/24/16 1940 11/25/16 0410 11/25/16 0433  BP: 133/69 120/76  109/84  Pulse: (!) 129 (!) 113  89  Resp: 20 20  20   Temp: 98.4 F (36.9 C) 98.4 F (36.9 C)  98.2 F (36.8 C)  TempSrc: Oral Oral  Axillary  SpO2: 97% 99% 98% 99%  Weight:      Height:        Intake/Output Summary (Last 24 hours) at 11/25/16 1211 Last data filed at 11/25/16 0800  Gross per 24 hour  Intake              780 ml  Output                0 ml  Net  780 ml   Filed Weights   11/23/16 2248 11/24/16 0911  Weight: 62.1 kg (137 lb) 62.7 kg (138 lb 4.8 oz)    Examination:  General exam: Appears calm and comfortable; has soft neck collar. Respiratory system: Clear to auscultation. Respiratory effort normal. Cardiovascular system: S1 & S2 heard, RRR. No JVD, murmurs, rubs, gallops or clicks. No pedal edema. Gastrointestinal system: Abdomen is nondistended, soft and nontender. No organomegaly or masses felt. Normal bowel sounds heard. Central nervous system: Alert and oriented. No focal neurological deficits. Extremities: Symmetric 5 x 5 power. Skin: No rashes, lesions or ulcers Psychiatry:  Judgement and insight appear normal. Mood & affect appropriate.   Data Reviewed: I have personally reviewed following labs and imaging studies  CBC:  Recent Labs Lab 11/19/16 0851 11/20/16 0256 11/21/16 0719 11/22/16 [id number] 11/23/16 2324 11/24/16 1007 11/24/16 1623 11/25/16 0029 11/25/16 0325 11/25/16 0823  WBC 8.0 6.4 6.0 5.5 7.8  --   --   --  5.1  --   NEUTROABS 6.2  --   --   --  5.6  --   --   --   --   --   HGB 9.5* 8.6* 8.3* 8.4* 9.3* 8.1* 7.4* 7.2* 7.3* 8.0*  HCT 30.1* 27.7* 27.1* 27.0* 29.2* 25.8* 23.7* 23.0* 23.5* 26.1*  MCV 79.8 77.8* 79.9 77.4* 77.2*  --   --   --  78.6  --   PLT 280 273 278 301 401*  --   --   --  300  --    Basic Metabolic Panel:  Recent Labs Lab 11/19/16 0851 11/20/16 0256 11/21/16 0719 11/22/16 0632 11/23/16 2324  NA 134* 135 136 137 136  K 3.8 3.8 4.1 4.1 3.8  CL 100* 101 104 102 100*  CO2 27 25 24 27 25   GLUCOSE 107* 115* 125* 97 108*  BUN 8 8 9 8 10   CREATININE 0.58 0.78 0.60 0.62 0.58  CALCIUM 9.4 8.7* 9.0 9.0 9.5   GFR: Estimated Creatinine Clearance: 75.6 mL/min (by C-G formula based on SCr of 0.58 mg/dL). Liver Function Tests:  Recent Labs Lab 11/19/16 0851  AST 12*  ALT 12*  ALKPHOS 60  BILITOT 0.3  PROT 7.9  ALBUMIN 3.8   No results for input(s): LIPASE, AMYLASE in the last 168 hours. No results for input(s): AMMONIA in the last 168 hours. Coagulation Profile:  Recent Labs Lab 11/19/16 1214 11/23/16 2324  INR 0.97 1.26   Cardiac Enzymes:  Recent Labs Lab 11/19/16 0851 11/23/16 2324  TROPONINI <0.03 <0.03   BNP (last 3 results) No results for input(s): PROBNP in the last 8760 hours. HbA1C: No results for input(s): HGBA1C in the last 72 hours. CBG:  Recent Labs Lab 11/24/16 1132 11/24/16 1636 11/25/16 0013 11/25/16 0625 11/25/16 1133  GLUCAP 145* 124* 150* 99 72   Lipid Profile: No results for input(s): CHOL, HDL, LDLCALC, TRIG, CHOLHDL, LDLDIRECT in the last 72 hours. Thyroid Function  Tests: No results for input(s): TSH, T4TOTAL, FREET4, T3FREE, THYROIDAB in the last 72 hours. Anemia Panel: No results for input(s): VITAMINB12, FOLATE, FERRITIN, TIBC, IRON, RETICCTPCT in the last 72 hours. Urine analysis:    Component Value Date/Time   COLORURINE YELLOW 11/07/2016 1014   APPEARANCEUR CLEAR 11/07/2016 1014   LABSPEC 1.012 11/07/2016 1014   PHURINE 6.0 11/07/2016 1014   GLUCOSEU NEGATIVE 11/07/2016 1014   HGBUR MODERATE (A) 11/07/2016 1014   BILIRUBINUR NEGATIVE 11/07/2016 1014   BILIRUBINUR neg 10/31/2014 1347   KETONESUR  NEGATIVE 11/07/2016 1014   PROTEINUR NEGATIVE 11/07/2016 1014   UROBILINOGEN negative 10/31/2014 1347   NITRITE NEGATIVE 11/07/2016 1014   LEUKOCYTESUR NEGATIVE 11/07/2016 1014   Sepsis Labs: @LABRCNTIP (procalcitonin:4,lacticidven:4)   MRSA PCR Screening     Status: None   Collection Time: 11/19/16  9:44 PM  Result Value Ref Range Status   MRSA by PCR NEGATIVE NEGATIVE Final  Rapid strep screen     Status: None   Collection Time: 11/24/16  3:40 AM  Result Value Ref Range Status   Streptococcus, Group A Screen (Direct) NEGATIVE NEGATIVE Final    Comment: (NOTE) A Rapid Antigen test may result negative if the antigen level in the sample is below the detection level of this test. The FDA has not cleared this test as a stand-alone test therefore the rapid antigen negative result has reflexed to a Group A Strep culture.       Radiology Studies: Dg Chest 2 View Result Date: 11/24/2016 Small left effusion. Persistent lingular airspace opacity, possibly pulmonary infarction given the known recent emboli.   Ct Angio Chest Pe W And/or Wo Contrast Result Date: 1/7/20181. 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 2. Extensive respiratory motion artifact. No new pulmonary embolus identified. 3. 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 No new airspace opacity. These results were called by telephone at  the time of interpretation on 11/24/2016 at 3:04 am to Dr. Ezequiel Essex , who verbally acknowledged these results. 1. Stable trace left pleural effusion.   US Transvaginal Non-ob Result Date: 11/24/2016 Uterine fibroid change is again noted. Stable mildly prominent endometrium. No acute abnormality noted.   US Pelvis Complete Result Date: 11/24/2016 Uterine fibroid change is again noted. Stable mildly prominent endometrium. No acute abnormality noted.    Scheduled Meds: . ALPRAZolam  0.5 mg Oral BID  . apixaban  5 mg Oral BID  . chlorpheniramine-HYDROcodone  5 mL Oral Q12H  . famotidine  20 mg Oral QHS  . ferrous sulfate  325 mg Oral BID WC  . fluticasone  2 spray Each Nare Daily  . furosemide  20 mg Oral Daily  . ipratropium  2 spray Each Nare BID  . latanoprost  1 drop Both Eyes QHS  . losartan  25 mg Oral Daily  . montelukast  10 mg Oral QHS  . pantoprazole  40 mg Oral Daily  . pravastatin  40 mg Oral Daily  . sodium chloride flush  3 mL Intravenous Q12H   Continuous Infusions:   LOS: 0 days    Time spent: 25 minutes  Greater than 50% of the time spent on counseling and coordinating the care.   Leisa Lenz, MD [facility] Pager [phone]  If 7PM-7AM, please contact night-coverage [email or web address] Password [email or web address] 11/25/2016, 12:11 PM

## 2016-11-25 NOTE — Progress Notes (Signed)
[facility]  Patient Status: Active (receiving services up to time of hospitalization)  [facility] is providing the following services: PT  If patient discharges after hours, please call [phone].   Jennifer Mullins 11/25/2016, 1:58 PM

## 2016-11-25 NOTE — Progress Notes (Signed)
ANTICOAGULATION CONSULT NOTE - Initial Consult  Pharmacy Consult for Heparin Indication: pulmonary embolus  Allergies  Allergen Reactions  . Demerol [Meperidine] Shortness Of Breath    FLUSHING AND SHORTNESS OF BREATH  . Morphine And Related Shortness Of Breath    Flushed and hot hyper  . Diltiazem Hcl Hives    Patient Measurements: Height: 5\' 2"  (157.5 cm) Weight: 138 lb 4.8 oz (62.7 kg) IBW/kg (Calculated) : 50.1  Vital Signs: Temp: 98.2 F (36.8 C) (01/08 0433) Temp Source: Axillary (01/08 0433) BP: 109/84 (01/08 0433) Pulse Rate: 89 (01/08 0433)  Labs:  Recent Labs  11/23/16 2324  11/25/16 0029 11/25/16 0325 11/25/16 0823  HGB 9.3*  < > 7.2* 7.3* 8.0*  HCT 29.2*  < > 23.0* 23.5* 26.1*  PLT 401*  --   --  300  --   LABPROT 15.9*  --   --   --   --   INR 1.26  --   --   --   --   CREATININE 0.58  --   --   --   --   TROPONINI <0.03  --   --   --   --   < > = values in this interval not displayed.  Estimated Creatinine Clearance: 75.6 mL/min (by C-G formula based on SCr of 0.58 mg/dL).  Assessment: 45yof with recent PE (1/2) on apixaban pta (10mg  bid and to start 5mg  bid on 1/12), admitted with heavy vaginal bleeding and hemoptysis. Apixaban initially resumed but CCM now recommending switch to heparin x 24 hours in case bleeding worsens or doesn't stop. Last apixaban dose this morning at 0902. Last admission she was treated with heparin and was therapeutic on 1200 units/hr. Hgb 8.  Will use aPTTs to guide initial heparin dosing as apixaban falsely elevates heparin levels.  Goal of Therapy:  APTT 66-102 seconds Heparin level 0.3-0.7 units/ml Monitor platelets by anticoagulation protocol: Yes   Plan:  1) At 2100 tonight, begin heparin at 1200 units/hr, no bolus 2) 6 hour aPTT and heparin level 3) Daily aPTT, heparin level, CBC 4) Monitor bleeding   Deboraha Sprang 11/25/2016,12:29 PM

## 2016-11-25 NOTE — Telephone Encounter (Signed)
Please call patient and see what she needs- thanks.

## 2016-11-25 NOTE — Telephone Encounter (Signed)
Spoke to pt and she just wanted [name] to know she is back in the hospital for a blood clot in the left lung and just wanted [name] to be aware.

## 2016-11-26 ENCOUNTER — Telehealth (INDEPENDENT_AMBULATORY_CARE_PROVIDER_SITE_OTHER): Payer: Self-pay | Admitting: *Deleted

## 2016-11-26 DIAGNOSIS — F411 Generalized anxiety disorder: Secondary | ICD-10-CM

## 2016-11-26 LAB — CBC
HCT: 25.1 % — ABNORMAL LOW (ref 36.0–46.0)
Hemoglobin: 7.6 g/dL — ABNORMAL LOW (ref 12.0–15.0)
MCH: 23.7 pg — ABNORMAL LOW (ref 26.0–34.0)
MCHC: 30.3 g/dL (ref 30.0–36.0)
MCV: 78.2 fL (ref 78.0–100.0)
Platelets: 373 10*3/uL (ref 150–400)
RBC: 3.21 MIL/uL — ABNORMAL LOW (ref 3.87–5.11)
RDW: 18.8 % — ABNORMAL HIGH (ref 11.5–15.5)
WBC: 5.7 10*3/uL (ref 4.0–10.5)

## 2016-11-26 LAB — CULTURE, GROUP A STREP (THRC)

## 2016-11-26 LAB — GLUCOSE, CAPILLARY
Glucose-Capillary: 80 mg/dL (ref 65–99)
Glucose-Capillary: 137 mg/dL — ABNORMAL HIGH (ref 65–99)

## 2016-11-26 LAB — PREPARE RBC (CROSSMATCH)

## 2016-11-26 MED ORDER — ALPRAZOLAM 0.5 MG PO TABS
1.0000 mg | ORAL_TABLET | Freq: Two times a day (BID) | ORAL | Status: DC
  Administered 2016-11-26 – 2016-11-28 (×4): 1 mg via ORAL
  Filled 2016-11-26 (×4): qty 2

## 2016-11-26 MED ORDER — LOSARTAN POTASSIUM 50 MG PO TABS
50.0000 mg | ORAL_TABLET | Freq: Every day | ORAL | Status: DC
  Administered 2016-11-27 – 2016-11-28 (×2): 50 mg via ORAL
  Filled 2016-11-26 (×2): qty 1

## 2016-11-26 MED ORDER — SODIUM CHLORIDE 0.9 % IV SOLN: Freq: Once | INTRAVENOUS | Status: AC

## 2016-11-26 NOTE — Telephone Encounter (Signed)
See note

## 2016-11-26 NOTE — Telephone Encounter (Signed)
I already saw her

## 2016-11-26 NOTE — Progress Notes (Signed)
   Subjective:    Patient reports pain as mild.  C/O sinus congestion. Mild dyspnea.   Objective: Vital signs in last 24 hours: Temp:  [97.7 F (36.5 C)-98 F (36.7 C)] 98 F (36.7 C) (01/09 0618) Pulse Rate:  [99-117] 99 (01/09 0618) Resp:  [18] 18 (01/09 0618) BP: (102-129)/(52-69) 102/52 (01/09 0618) SpO2:  [98 %-100 %] 98 % (01/09 0618) Weight:  [139 lb 8.8 oz (63.3 kg)] 139 lb 8.8 oz (63.3 kg) (01/09 0618)  Intake/Output from previous day: 01/08 0701 - 01/09 0700 In: 540 [P.O.:540] Out: -  Intake/Output this shift: No intake/output data recorded.   Recent Labs  11/24/16 1623 11/25/16 0029 11/25/16 0325 11/25/16 0823 11/26/16 0352  HGB 7.4* 7.2* 7.3* 8.0* 7.6*    Recent Labs  11/25/16 0325 11/25/16 0823 11/26/16 0352  WBC 5.1  --  5.7  RBC 2.99*  --  3.21*  HCT 23.5* 26.1* 25.1*  PLT 300  --  373    Recent Labs  11/23/16 2324  NA 136  K 3.8  CL 100*  CO2 25  BUN 10  CREATININE 0.58  GLUCOSE 108*  CALCIUM 9.5    Recent Labs  11/23/16 2324  INR 1.26    Neurologically intact ,  Neck soft , no hematoma.  No results found.  Assessment/Plan:    followup 2-4 wks in office .    Call if further concerns,  [phone]  Jennifer Mullins 11/26/2016, 8:37 AM

## 2016-11-26 NOTE — Progress Notes (Addendum)
Patient ID: VIRTIE MISKIEWICZ, female   DOB: January 10, 1971, 46 y.o.   MRN: [id number]  PROGRESS NOTE    KIMIAH COSENTINO  [id number] DOB: 03/21/1971 DOA: 11/23/2016  PCP: Chevis Pretty, FNP   Brief Narrative:  46 y.o.femalewith medical history significant for hypertension, type 2 diabetes mellitus, COPD, generalized anxiety disorder, uterine fibroids, and recent postoperative pulmonary embolism treated with IV heparin initially and then started on Eliquison 11/22/2016. Patient presented to ED with heavy vaginal bleeding shortly after started on anticoagulation. She reported a history of uterine fibroids and [place] Patient also reported increase in cough productive of clear sputum. No fevers or chills. She has chest pain with coughing.  In ED, patient was afebrile with good oxygen saturation. She was tachycardic with heart rate in 120s. The 12-lead EKG showed sinus tachycardia. Chest x-ray was notable for small left pleural effusion with lingular opacity likely representing an infarct given the known PE in that area. CBC was notable for hemoglobin of 9.3 and MCV of 77.2. Troponin was unremarkable and BNP was within normal limits. 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 There is progressive lingular infarction is noted as well.  Assessment & Plan:  Acute blood loss anemia from heavy vaginal bleeding / dysfunctional uterine bleed / hemoptysis  - Likely in the setting of anticoagulation - Hemoglobin down to 7.6 this morning, it was 8 yesterday - She still has vaginal bleeding and slight hemoptysis, speckles of blood in tissue when she is coughing - I spoke with pulmonary on call on 11/25/2016 recommended switching patient to heparin drip. I spoke with the patient about this and she declined being on heparin drip so we changed anticoagulation to eliquis 5 mg BID - We'll  give 1 unit of PRBC transfusion and continue to monitor for bleeding  Pulmonary embolism - Continue eliquis  - Continue xopenex nebulizer as needed for shortness of breath or wheezing - Refused dulera   Essential hypertension - Continue losartan and lasix  Dyslipidemia - Continue pravastatin   Diabetes mellitus without complications without long term insulin use - Continue metformin - Pt refused to have insulin sliding scale   Recent C-spine surgery - Seen by Dr. Lorin Mercy   Anxiety - Continue xanax 1 mg BID   DVT prophylaxis: on [facility] with eliquis  Code Status: full code  Family Communication: friend at the bedside this am Disposition Plan: home once bleeding resolves.   Consultants:   Pulmonary - phone call only   Procedures:   1 U PRBC 11/26/2016   Antimicrobials:   None    Subjective: Still some vaginal bleeding and hemoptysis but better.  Objective: Vitals:   11/25/16 1655 11/25/16 2001 11/26/16 0618 11/26/16 0928  BP: 129/69 121/65 (!) 102/52 129/68  Pulse: (!) 117 (!) 101 99   Resp:  18 18   Temp:  97.7 F (36.5 C) 98 F (36.7 C)   TempSrc:  Oral Oral   SpO2:  99% 98% 100%  Weight:   63.3 kg (139 lb 8.8 oz)   Height:        Intake/Output Summary (Last 24 hours) at 11/26/16 1011 Last data filed at 11/26/16 0900  Gross per 24 hour  Intake              720 ml  Output  0 ml  Net              720 ml   Filed Weights   11/23/16 2248 11/24/16 0911 11/26/16 0618  Weight: 62.1 kg (137 lb) 62.7 kg (138 lb 4.8 oz) 63.3 kg (139 lb 8.8 oz)    Examination:  General exam: No distress; has soft neck collar. Respiratory system: Clear to auscultation. No wheezing  Cardiovascular system: S1 & S2 heard, Rate controlled  Gastrointestinal system: (+) BS, non tender abdomen  Central nervous system: No focal neurological deficits. Extremities: No edema, palpable pulses  Skin: Skin is warm and dry  Psychiatry: Mood & affect appropriate.   Data  Reviewed: I have personally reviewed following labs and imaging studies  CBC:  Recent Labs Lab 11/21/16 0719 11/22/16 [id number] 11/23/16 2324  11/24/16 1623 11/25/16 0029 11/25/16 0325 11/25/16 0823 11/26/16 0352  WBC 6.0 5.5 7.8  --   --   --  5.1  --  5.7  NEUTROABS  --   --  5.6  --   --   --   --   --   --   HGB 8.3* 8.4* 9.3*  < > 7.4* 7.2* 7.3* 8.0* 7.6*  HCT 27.1* 27.0* 29.2*  < > 23.7* 23.0* 23.5* 26.1* 25.1*  MCV 79.9 77.4* 77.2*  --   --   --  78.6  --  78.2  PLT 278 301 401*  --   --   --  300  --  373  < > = values in this interval not displayed. Basic Metabolic Panel:  Recent Labs Lab 11/20/16 0256 11/21/16 0719 11/22/16 0632 11/23/16 2324  NA 135 136 137 136  K 3.8 4.1 4.1 3.8  CL 101 104 102 100*  CO2 25 24 27 25   GLUCOSE 115* 125* 97 108*  BUN 8 9 8 10   CREATININE 0.78 0.60 0.62 0.58  CALCIUM 8.7* 9.0 9.0 9.5   GFR: Estimated Creatinine Clearance: 76 mL/min (by C-G formula based on SCr of 0.58 mg/dL). Liver Function Tests: No results for input(s): AST, ALT, ALKPHOS, BILITOT, PROT, ALBUMIN in the last 168 hours. No results for input(s): LIPASE, AMYLASE in the last 168 hours. No results for input(s): AMMONIA in the last 168 hours. Coagulation Profile:  Recent Labs Lab 11/19/16 1214 11/23/16 2324  INR 0.97 1.26   Cardiac Enzymes:  Recent Labs Lab 11/23/16 2324  TROPONINI <0.03   BNP (last 3 results) No results for input(s): PROBNP in the last 8760 hours. HbA1C: No results for input(s): HGBA1C in the last 72 hours. CBG:  Recent Labs Lab 11/25/16 0013 11/25/16 0625 11/25/16 1133 11/25/16 2224 11/26/16 0706  GLUCAP 150* 99 72 165* 80   Lipid Profile: No results for input(s): CHOL, HDL, LDLCALC, TRIG, CHOLHDL, LDLDIRECT in the last 72 hours. Thyroid Function Tests: No results for input(s): TSH, T4TOTAL, FREET4, T3FREE, THYROIDAB in the last 72 hours. Anemia Panel: No results for input(s): VITAMINB12, FOLATE, FERRITIN, TIBC, IRON,  RETICCTPCT in the last 72 hours. Urine analysis:    Component Value Date/Time   COLORURINE YELLOW 11/07/2016 1014   APPEARANCEUR CLEAR 11/07/2016 1014   LABSPEC 1.012 11/07/2016 1014   PHURINE 6.0 11/07/2016 1014   GLUCOSEU NEGATIVE 11/07/2016 1014   HGBUR MODERATE (A) 11/07/2016 1014   BILIRUBINUR NEGATIVE 11/07/2016 1014   BILIRUBINUR neg 10/31/2014 [place] 11/07/2016 1014   PROTEINUR NEGATIVE 11/07/2016 1014   UROBILINOGEN negative 10/31/2014 1347   NITRITE NEGATIVE 11/07/2016 1014  LEUKOCYTESUR NEGATIVE 11/07/2016 1014   Sepsis Labs: @LABRCNTIP (procalcitonin:4,lacticidven:4)   MRSA PCR Screening     Status: None   Collection Time: 11/19/16  9:44 PM  Result Value Ref Range Status   MRSA by PCR NEGATIVE NEGATIVE Final  Rapid strep screen     Status: None   Collection Time: 11/24/16  3:40 AM  Result Value Ref Range Status   Streptococcus, Group A Screen (Direct) NEGATIVE NEGATIVE Final    Comment: (NOTE) A Rapid Antigen test may result negative if the antigen level in the sample is below the detection level of this test. The FDA has not cleared this test as a stand-alone test therefore the rapid antigen negative result has reflexed to a Group A Strep culture.       Radiology Studies: Dg Chest 2 View Result Date: 11/24/2016 Small left effusion. Persistent lingular airspace opacity, possibly pulmonary infarction given the known recent emboli.   Ct Angio Chest Pe W And/or Wo Contrast Result Date: 1/7/20181. 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 2. Extensive respiratory motion artifact. No new pulmonary embolus identified. 3. 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 No new airspace opacity. These results were called by telephone at the time of interpretation on 11/24/2016 at 3:04 am to Dr. Ezequiel Essex , who verbally acknowledged these results. 1. Stable trace left pleural effusion.   US Transvaginal  Non-ob Result Date: 11/24/2016 Uterine fibroid change is again noted. Stable mildly prominent endometrium. No acute abnormality noted.   US Pelvis Complete Result Date: 11/24/2016 Uterine fibroid change is again noted. Stable mildly prominent endometrium. No acute abnormality noted.    Scheduled Meds: . sodium chloride   Intravenous Once  . ALPRAZolam  1 mg Oral BID  . apixaban  5 mg Oral BID  . chlorpheniramine-HYDROcodone  5 mL Oral Q12H  . famotidine  20 mg Oral QHS  . ferrous sulfate  325 mg Oral BID WC  . fluticasone  2 spray Each Nare Daily  . furosemide  20 mg Oral Daily  . ipratropium  2 spray Each Nare BID  . latanoprost  1 drop Both Eyes QHS  . [START ON 11/27/2016] losartan  50 mg Oral Daily  . montelukast  10 mg Oral QHS  . pantoprazole  40 mg Oral Daily  . pravastatin  40 mg Oral Daily  . sodium chloride flush  3 mL Intravenous Q12H   Continuous Infusions:   LOS: 1 day    Time spent: 25 minutes  Greater than 50% of the time spent on counseling and coordinating the care.   Leisa Lenz, MD [facility] Pager [phone]  If 7PM-7AM, please contact night-coverage [email or web address] Password [email or web address] 11/26/2016, 10:11 AM

## 2016-11-26 NOTE — Telephone Encounter (Signed)
Pt called stating she is in the hospital and didn't know if Dr. Lorin Mercy would be coming to see her.

## 2016-11-27 LAB — GLUCOSE, CAPILLARY
Glucose-Capillary: 102 mg/dL — ABNORMAL HIGH (ref 65–99)
Glucose-Capillary: 85 mg/dL (ref 65–99)

## 2016-11-27 LAB — TYPE AND SCREEN
Blood Product Expiration Date: 201801192359
ISSUE DATE / TIME: 201801091146
Unit Type and Rh: 5100

## 2016-11-27 LAB — CBC
HCT: 26.4 % — ABNORMAL LOW (ref 36.0–46.0)
Hemoglobin: 8.5 g/dL — ABNORMAL LOW (ref 12.0–15.0)
MCH: 25.3 pg — ABNORMAL LOW (ref 26.0–34.0)
MCHC: 32.2 g/dL (ref 30.0–36.0)
MCV: 78.6 fL (ref 78.0–100.0)
Platelets: 316 10*3/uL (ref 150–400)
RBC: 3.36 MIL/uL — ABNORMAL LOW (ref 3.87–5.11)
RDW: 17.9 % — ABNORMAL HIGH (ref 11.5–15.5)
WBC: 5 10*3/uL (ref 4.0–10.5)

## 2016-11-27 NOTE — Progress Notes (Signed)
Patient ID: Jennifer Mullins, female   DOB: 1971-09-11, 46 y.o.   MRN: [id number]  PROGRESS NOTE    ELIANAH BURROWES  [id number] DOB: Apr 05, 1971 DOA: 11/23/2016  PCP: Chevis Pretty, FNP   Brief Narrative:  46 y.o.femalewith medical history significant for hypertension, type 2 diabetes mellitus, COPD, generalized anxiety disorder, uterine fibroids, and recent postoperative pulmonary embolism treated with IV heparin initially and then started on Eliquison 11/22/2016. Patient presented to ED with heavy vaginal bleeding shortly after started on anticoagulation. She reported a history of uterine fibroids and [place]. Patient also reported increase in cough productive of clear sputum. No fevers or chills. She has chest pain with coughing.  In ED, patient was afebrile with good oxygen saturation. She was tachycardic with heart rate in 120s. The 12-lead EKG showed sinus tachycardia. Chest x-ray was notable for small left pleural effusion with lingular opacity likely representing an infarct given the known PE in that area. CBC was notable for hemoglobin of 9.3 and MCV of 77.2. Troponin was unremarkable and BNP was within normal limits. 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 There is progressive lingular infarction is noted as well.  Assessment & Plan:  Acute blood loss anemia from heavy vaginal bleeding / dysfunctional uterine bleed / hemoptysis  - Likely in the setting of anticoagulation - Hemoglobin down to 7.6 on 1/9 so 1 U PRBC transfusion given 11/26/2016 - Vaginal bleeding better, hemoptysis better - Spoke with pulmonary on call on 11/25/2016 recommended switching patient to heparin drip. I spoke with the patient about this and she declined being on heparin drip so we changed anticoagulation to eliquis 5 mg BID - Check CBC in am  Pulmonary embolism - Continue  bronchodilators as needed - Continue eliquis   Essential hypertension - Continue losartan and lasix  Dyslipidemia - Continue pravastatin   Diabetes mellitus without complications without long term insulin use - Continue metformin - Pt refused to have insulin sliding scale   Recent C-spine surgery - Seen by Dr. Lorin Mercy   Anxiety - Continue xanax 1 mg BID   DVT prophylaxis: on [facility] with eliquis  Code Status: full code  Family Communication: friend at the bedside this am Disposition Plan: home once bleeding resolves.   Consultants:   Pulmonary - phone call only   Procedures:   1 U PRBC 11/26/2016   Antimicrobials:   None    Subjective: Bleeding improving.   Objective: Vitals:   11/26/16 2050 11/26/16 2330 11/27/16 0612 11/27/16 1046  BP: 123/65  117/65 124/68  Pulse: 93  78 (!) 101  Resp: 20  20   Temp: 98.2 F (36.8 C)  97.9 F (36.6 C)   TempSrc: Oral  Oral   SpO2:  99% 99% 96%  Weight:   63.3 kg (139 lb 8 oz)   Height:        Intake/Output Summary (Last 24 hours) at 11/27/16 1109 Last data filed at 11/27/16 0730  Gross per 24 hour  Intake              950 ml  Output                0 ml  Net              950 ml   Filed Weights   11/24/16 0911 11/26/16 0618 11/27/16 0612  Weight: 62.7 kg (138 lb  4.8 oz) 63.3 kg (139 lb 8.8 oz) 63.3 kg (139 lb 8 oz)    Examination:  General exam: No distress; has soft neck collar. Respiratory system: No wheezing, no rhonchi  Cardiovascular system: S1 & S2 heard, RRR Gastrointestinal system: (+) BS, non tender abdomen  Central nervous system: No focal neurological deficits. Extremities: No edema, palpable pulses bilaterally  Skin: No lesions, no ulcers  Psychiatry: No agitation or restlessness   Data Reviewed: I have personally reviewed following labs and imaging studies  CBC:  Recent Labs Lab 11/22/16 0632 11/23/16 2324  11/25/16 0029 11/25/16 0325 11/25/16 0823 11/26/16 0352 11/27/16 0248  WBC 5.5  7.8  --   --  5.1  --  5.7 5.0  NEUTROABS  --  5.6  --   --   --   --   --   --   HGB 8.4* 9.3*  < > 7.2* 7.3* 8.0* 7.6* 8.5*  HCT 27.0* 29.2*  < > 23.0* 23.5* 26.1* 25.1* 26.4*  MCV 77.4* 77.2*  --   --  78.6  --  78.2 78.6  PLT 301 401*  --   --  300  --  373 316  < > = values in this interval not displayed. Basic Metabolic Panel:  Recent Labs Lab 11/21/16 0719 11/22/16 0632 11/23/16 2324  NA 136 137 136  K 4.1 4.1 3.8  CL 104 102 100*  CO2 24 27 25   GLUCOSE 125* 97 108*  BUN 9 8 10   CREATININE 0.60 0.62 0.58  CALCIUM 9.0 9.0 9.5   GFR: Estimated Creatinine Clearance: 76 mL/min (by C-G formula based on SCr of 0.58 mg/dL). Liver Function Tests: No results for input(s): AST, ALT, ALKPHOS, BILITOT, PROT, ALBUMIN in the last 168 hours. No results for input(s): LIPASE, AMYLASE in the last 168 hours. No results for input(s): AMMONIA in the last 168 hours. Coagulation Profile:  Recent Labs Lab 11/23/16 2324  INR 1.26   Cardiac Enzymes:  Recent Labs Lab 11/23/16 2324  TROPONINI <0.03   BNP (last 3 results) No results for input(s): PROBNP in the last 8760 hours. HbA1C: No results for input(s): HGBA1C in the last 72 hours. CBG:  Recent Labs Lab 11/25/16 2224 11/26/16 0706 11/26/16 2115 11/27/16 0025 11/27/16 0612  GLUCAP 165* 80 137* 102* 85   Lipid Profile: No results for input(s): CHOL, HDL, LDLCALC, TRIG, CHOLHDL, LDLDIRECT in the last 72 hours. Thyroid Function Tests: No results for input(s): TSH, T4TOTAL, FREET4, T3FREE, THYROIDAB in the last 72 hours. Anemia Panel: No results for input(s): VITAMINB12, FOLATE, FERRITIN, TIBC, IRON, RETICCTPCT in the last 72 hours. Urine analysis:    Component Value Date/Time   COLORURINE YELLOW 11/07/2016 1014   APPEARANCEUR CLEAR 11/07/2016 1014   LABSPEC 1.012 11/07/2016 1014   PHURINE 6.0 11/07/2016 1014   GLUCOSEU NEGATIVE 11/07/2016 1014   HGBUR MODERATE (A) 11/07/2016 1014   BILIRUBINUR NEGATIVE 11/07/2016  1014   BILIRUBINUR neg 10/31/2014 1347   KETONESUR NEGATIVE 11/07/2016 1014   PROTEINUR NEGATIVE 11/07/2016 1014   UROBILINOGEN negative 10/31/2014 1347   NITRITE NEGATIVE 11/07/2016 1014   LEUKOCYTESUR NEGATIVE 11/07/2016 1014   Sepsis Labs: @LABRCNTIP (procalcitonin:4,lacticidven:4)   MRSA PCR Screening     Status: None   Collection Time: 11/19/16  9:44 PM  Result Value Ref Range Status   MRSA by PCR NEGATIVE NEGATIVE Final  Rapid strep screen     Status: None   Collection Time: 11/24/16  3:40 AM  Result Value Ref Range Status  Streptococcus, Group A Screen (Direct) NEGATIVE NEGATIVE Final    Comment: (NOTE) A Rapid Antigen test may result negative if the antigen level in the sample is below the detection level of this test. The FDA has not cleared this test as a stand-alone test therefore the rapid antigen negative result has reflexed to a Group A Strep culture.       Radiology Studies: Dg Chest 2 View Result Date: 11/24/2016 Small left effusion. Persistent lingular airspace opacity, possibly pulmonary infarction given the known recent emboli.   Ct Angio Chest Pe W And/or Wo Contrast Result Date: 1/7/20181. 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 2. Extensive respiratory motion artifact. No new pulmonary embolus identified. 3. 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 No new airspace opacity. These results were called by telephone at the time of interpretation on 11/24/2016 at 3:04 am to Dr. Ezequiel Essex , who verbally acknowledged these results. 1. Stable trace left pleural effusion.   US Transvaginal Non-ob Result Date: 11/24/2016 Uterine fibroid change is again noted. Stable mildly prominent endometrium. No acute abnormality noted.   US Pelvis Complete Result Date: 11/24/2016 Uterine fibroid change is again noted. Stable mildly prominent endometrium. No acute abnormality noted.    Scheduled Meds: . sodium chloride    Intravenous Once  . ALPRAZolam  1 mg Oral BID  . apixaban  5 mg Oral BID  . chlorpheniramine-HYDROcodone  5 mL Oral Q12H  . famotidine  20 mg Oral QHS  . ferrous sulfate  325 mg Oral BID WC  . fluticasone  2 spray Each Nare Daily  . furosemide  20 mg Oral Daily  . ipratropium  2 spray Each Nare BID  . latanoprost  1 drop Both Eyes QHS  . losartan  50 mg Oral Daily  . montelukast  10 mg Oral QHS  . pantoprazole  40 mg Oral Daily  . pravastatin  40 mg Oral Daily  . sodium chloride flush  3 mL Intravenous Q12H   Continuous Infusions:   LOS: 2 days    Time spent: 15 minutes  Greater than 50% of the time spent on counseling and coordinating the care.   Leisa Lenz, MD [facility] Pager [phone]  If 7PM-7AM, please contact night-coverage [email or web address] Password [email or web address] 11/27/2016, 11:09 AM

## 2016-11-28 LAB — CBC
HCT: 32 % — ABNORMAL LOW (ref 36.0–46.0)
Hemoglobin: 10.3 g/dL — ABNORMAL LOW (ref 12.0–15.0)
MCH: 25.9 pg — ABNORMAL LOW (ref 26.0–34.0)
MCHC: 32.2 g/dL (ref 30.0–36.0)
MCV: 80.4 fL (ref 78.0–100.0)
Platelets: 348 10*3/uL (ref 150–400)
RBC: 3.98 MIL/uL (ref 3.87–5.11)
RDW: 18.8 % — ABNORMAL HIGH (ref 11.5–15.5)
WBC: 5.6 10*3/uL (ref 4.0–10.5)

## 2016-11-28 LAB — GLUCOSE, CAPILLARY: Glucose-Capillary: 104 mg/dL — ABNORMAL HIGH (ref 65–99)

## 2016-11-28 MED ORDER — BENZONATATE 200 MG PO CAPS: 200.0000 mg | ORAL_CAPSULE | Freq: Three times a day (TID) | ORAL | 0 refills | Status: DC | PRN

## 2016-11-28 MED ORDER — ACETAMINOPHEN 325 MG PO TABS: 650.0000 mg | ORAL_TABLET | Freq: Four times a day (QID) | ORAL | 0 refills | Status: DC | PRN

## 2016-11-28 MED ORDER — ALPRAZOLAM 1 MG PO TABS: 1.0000 mg | ORAL_TABLET | Freq: Two times a day (BID) | ORAL | 0 refills | Status: DC

## 2016-11-28 MED ORDER — ALBUTEROL SULFATE HFA 108 (90 BASE) MCG/ACT IN AERS: 1.0000 | INHALATION_SPRAY | RESPIRATORY_TRACT | 0 refills | Status: DC | PRN

## 2016-11-28 MED ORDER — HYDROCOD POLST-CPM POLST ER 10-8 MG/5ML PO SUER: 5.0000 mL | Freq: Two times a day (BID) | ORAL | 0 refills | Status: DC

## 2016-11-28 MED ORDER — APIXABAN 5 MG PO TABS: 5.0000 mg | ORAL_TABLET | Freq: Two times a day (BID) | ORAL | 0 refills | Status: DC

## 2016-11-28 NOTE — Discharge Summary (Signed)
Patient ID: Jennifer Mullins MRN: [id number] DOB/AGE: 06-08-1971 46 y.o.  Admit date: 11/13/2016 Discharge date: 11/28/2016  Admission Diagnoses:  Active Problems:   Spondylosis of cervical region without myelopathy or radiculopathy   Discharge Diagnoses:  Active Problems:   Spondylosis of cervical region without myelopathy or radiculopathy  status post Procedure(s): Cervical five-six, Cervical six-seven Anterior Cervical Discectomy and Fusion, Allograft, Plate  Past Medical History:  Diagnosis Date  . Anemia   . Anxiety   . Arthritis   . Asthma   . Bilateral calcaneal spurs   . Bilateral polycystic ovarian syndrome   . Bulging lumbar disc   . Carpal tunnel syndrome, bilateral   . COPD (chronic obstructive pulmonary disease) (HCC)    BRONCHITIS  . Diabetes mellitus without complication ([place])    TYPE 2   DX  4-5 YRS AGO  . Fibroids    uterine  . GAD (generalized anxiety disorder)   . GERD (gastroesophageal reflux disease)    TAKES PRESCRIPTION MEDS  . Glaucoma    BOTH EYES  . Hyperlipidemia   . Hypertension   . Hypothyroidism    NODULES ON THYROID  . PONV (postoperative nausea and vomiting)   . Pulmonary embolism ([place])   . Tachycardia     Surgeries: Procedure(s): Cervical five-six, Cervical six-seven Anterior Cervical Discectomy and Fusion, Allograft, Plate on [id number]   Consultants:   Discharged Condition: Improved  Hospital Course: Jennifer Mullins is an 46 y.o. female who was admitted 11/13/2016 for operative treatment of cervical stenosis.. Patient failed conservative treatments (please see the history and physical for the specifics) and had severe unremitting pain that affects sleep, daily activities and work/hobbies. After pre-op clearance, the patient was taken to the operating room on 11/13/2016 and underwent  Procedure(s): Cervical five-six, Cervical six-seven Anterior Cervical Discectomy and Fusion, Allograft, Plate.    Patient was given  perioperative antibiotics: Anti-infectives    Start     Dose/Rate Route Frequency Ordered Stop   11/13/16 1300  vancomycin (VANCOCIN) IVPB 1000 mg/200 mL premix     1,000 mg 200 mL/hr over 60 Minutes Intravenous  Once 11/13/16 1249 11/13/16 1400   11/13/16 1251  vancomycin (VANCOCIN) 1-5 GM/200ML-% IVPB    Comments:  Rosenberger, Meredit: cabinet override      11/13/16 1251 11/13/16 1315   11/13/16 1200  ceFAZolin (ANCEF) IVPB 2g/100 mL premix  Status:  Discontinued     2 g 200 mL/hr over 30 Minutes Intravenous On call to O.R. 11/13/16 0945 11/13/16 1249       Patient was given sequential compression devices and early ambulation to prevent DVT.   Patient benefited maximally from hospital stay and there were no complications. At the time of discharge, the patient was urinating/moving their bowels without difficulty, tolerating a regular diet, pain is controlled with oral pain medications and they have been cleared by PT/OT.   Recent vital signs: No data found.    Recent laboratory studies:  Recent Labs  11/27/16 0248 11/28/16 0409  WBC 5.0 5.6  HGB 8.5* 10.3*  HCT 26.4* 32.0*  PLT 316 348     Discharge Medications:   Allergies as of 11/14/2016      Reactions   Demerol [meperidine] Shortness Of Breath   FLUSHING AND SHORTNESS OF BREATH   Morphine And Related Shortness Of Breath   Flushed and hot hyper   Diltiazem Hcl Hives      Medication List    STOP taking these medications  acetaminophen 500 MG tablet Commonly known as:  TYLENOL     TAKE these medications   albuterol 108 (90 Base) MCG/ACT inhaler Commonly known as:  PROVENTIL HFA;VENTOLIN HFA Inhale 1-2 puffs into the lungs daily as needed. Rescue inhaler What changed:  reasons to take this  additional instructions  Another medication with the same name was removed. Continue taking this medication, and follow the directions you see here.   ALPRAZolam 0.5 MG tablet Commonly known as:  XANAX Take 1  tablet (0.5 mg total) by mouth 2 (two) times daily as needed. What changed:  when to take this   benzonatate 200 MG capsule Commonly known as:  TESSALON Take 200 mg by mouth 3 (three) times daily as needed for cough.   bimatoprost 0.01 % Soln Commonly known as:  LUMIGAN Place 1 drop into both eyes at bedtime.   budesonide-formoterol 160-4.5 MCG/ACT inhaler Commonly known as:  SYMBICORT Inhale 2 puffs into the lungs 2 (two) times daily.   carboxymethylcellulose 0.5 % Soln Commonly known as:  REFRESH PLUS Place 1 drop into both eyes 3 (three) times daily as needed (dryness).   cyproheptadine 4 MG tablet Commonly known as:  PERIACTIN Take 1 tablet (4 mg total) by mouth at bedtime. What changed:  when to take this  reasons to take this   famotidine 20 MG tablet Commonly known as:  PEPCID Take 1 tablet (20 mg total) by mouth at bedtime.   fluticasone 50 MCG/ACT nasal spray Commonly known as:  FLONASE Place 2 sprays into both nostrils daily.   furosemide 20 MG tablet Commonly known as:  LASIX Take 1 tablet (20 mg total) by mouth daily.   hydrOXYzine 50 MG tablet Commonly known as:  ATARAX/VISTARIL Take 1 tablet (50 mg total) by mouth 3 (three) times daily as needed for itching.   ipratropium 0.03 % nasal spray Commonly known as:  ATROVENT Place 2 sprays into both nostrils every 12 (twelve) hours. What changed:  when to take this  reasons to take this   metFORMIN 500 MG tablet Commonly known as:  GLUCOPHAGE Take 1 tablet (500 mg total) by mouth 2 (two) times daily with a meal. What changed:  when to take this  reasons to take this   montelukast 10 MG tablet Commonly known as:  SINGULAIR TAKE 1 TABLET BY MOUTH AT BEDTIME   Olopatadine HCl 0.6 % Soln Place 1 drop (1 puff total) into the nose every morning. What changed:  how to take this  when to take this  reasons to take this   pantoprazole 40 MG tablet Commonly known as:  PROTONIX Take 1 tablet  (40 mg total) by mouth daily.   pravastatin 40 MG tablet Commonly known as:  PRAVACHOL Take 1 tablet (40 mg total) by mouth daily.   pseudoephedrine-guaifenesin 60-600 MG 12 hr tablet Commonly known as:  MUCINEX D Take 1 tablet by mouth every 12 (twelve) hours as needed for congestion.       Diagnostic Studies: Dg Chest 2 View  Result Date: 11/24/2016 CLINICAL DATA:  Dyspnea. Recent hospitalization for pulmonary embolism. Hypertension. EXAM: CHEST  2 VIEW COMPARISON:  11/19/2016 FINDINGS: Persistent lingular opacity and small left pleural effusion. This may represent pulmonary infarction from the known emboli. Right lung is clear. Pulmonary vasculature is normal. Hilar and mediastinal contours are unremarkable. IMPRESSION: Small left effusion. Persistent lingular airspace opacity, possibly pulmonary infarction given the known recent emboli. Electronically Signed   By: Valerie Roys.D.  On: 11/24/2016 00:56   Dg Chest 2 View  Result Date: 11/19/2016 CLINICAL DATA:  Left side chest pain. EXAM: CHEST  2 VIEW COMPARISON:  04/02/2016 FINDINGS: Airspace opacity noted at the left base concerning for pneumonia. Right lung is clear. Heart is normal size. No effusions or acute bony abnormality. IMPRESSION: Left basilar opacity concerning for pneumonia. Electronically Signed   By: Rolm Baptise M.D.   On: 11/19/2016 09:17   Dg Cervical Spine 2-3 Views  Result Date: 11/13/2016 CLINICAL DATA:  Anterior fusion at C5-6 and C6-7 EXAM: CERVICAL SPINE - 2-3 VIEW COMPARISON:  MR C-spine of 10/09/2015 FINDINGS: Two C-arm spot films show anterior fusion at C5-6 and [id number] with no complicating features noted. Interbody fusion plugs at C5-6 and C6-7 appear to be in good position with normal alignment maintained. IMPRESSION: Anterior fusion at C5-6 and C6-7.  No complicating features. Electronically Signed   By: Ivar Drape M.D.   On: 11/13/2016 15:51   Ct Angio Chest Pe W And/or Wo Contrast  Result Date:  11/24/2016 CLINICAL DATA:  46 y/o F; shortness of breath and history of pulmonary embolus. EXAM: CT ANGIOGRAPHY CHEST WITH CONTRAST TECHNIQUE: Multidetector CT imaging of the chest was performed using the standard protocol during bolus administration of intravenous contrast. Multiplanar CT image reconstructions and MIPs were obtained to evaluate the vascular anatomy. CONTRAST:  100 cc Isovue 370 COMPARISON:  11/19/2016 CT angiogram of the chest. FINDINGS: Cardiovascular: Respiratory motion artifact. Satisfactory opacification of the pulmonary arteries. There is persistent occlusion of the left upper lobe lingular branch pulmonary artery. No new pulmonary embolus is identified. Normal heart size.  No pericardial effusion. Mediastinum/Nodes: No enlarged mediastinal, hilar, or axillary lymph nodes. Thyroid gland, trachea, and esophagus demonstrate no significant findings. Lungs/Pleura: Progressive opacification of the lingula likely represents worsening pulmonary infarct. No new airspace consolidation is identified. Trace left pleural effusion. Upper Abdomen: No acute abnormality. Musculoskeletal: No chest wall abnormality. No acute or significant osseous findings. Review of the MIP images confirms the above findings. IMPRESSION: 1. 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 2. Extensive respiratory motion artifact. No new pulmonary embolus identified. 3. 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 No new airspace opacity. These results were called by telephone at the time of interpretation on 11/24/2016 at 3:04 am to Dr. Ezequiel Essex , who verbally acknowledged these results. 1. Stable trace left pleural effusion. Electronically Signed   By: Kristine Garbe M.D.   On: 11/24/2016 03:06   Ct Angio Chest Pe W And/or Wo Contrast  Result Date: 11/19/2016 CLINICAL DATA:  46 year old female status post cervical spine she surgery 6 days ago with left anterior chest and  shoulder pain. Hemoptysis for 3 days. Initial encounter. EXAM: CT ANGIOGRAPHY CHEST WITH CONTRAST TECHNIQUE: Multidetector CT imaging of the chest was performed using the standard protocol during bolus administration of intravenous contrast. Multiplanar CT image reconstructions and MIPs were obtained to evaluate the vascular anatomy. CONTRAST:  100 mL Isovue 370 COMPARISON:  Chest radiographs 0855 hours today. [facility] Chest CTA 06/21/2013 FINDINGS: Cardiovascular: Good contrast bolus timing in the pulmonary arterial tree. Positive for pulmonary emboli. Left lingula and possibly also anterior basal segment occlusive pulmonary thrombus. No left upper lobe involvement identified. No central or saddle embolus. No right lung involvement identified. The interventricular septum has a normal configuration without CT evidence of right heart strain. RV / LV ratio = 0.7. No pericardial effusion. Negative visualized aorta. Mediastinum/Nodes: No mediastinal lymphadenopathy.  Lungs/Pleura: Major airways are patent. Small layering left pleural effusion. Confluent ground-glass opacity in the posterolateral lingula is observed by the most conspicuous area of occlusive thrombus and is compatible with pulmonary infarct (series 9, image 61). There is otherwise dependent opacity in the lower lobe which may simply reflect atelectasis. In the contralateral right lung there is mild dependent atelectasis. Upper Abdomen: Negative visualized liver, spleen, pancreas, left adrenal gland, left kidney, and bowel in the upper abdomen. Other findings: Mild postoperative prevertebral fluid or inflammation at the cervicothoracic junction. Visualized thoracic inlet otherwise within normal limits. Musculoskeletal: Sequelae of lower cervical ACDF to the C7 level. Visible hardware and cervical vertebrae appear intact. Minimal thoracic scoliosis. No acute thoracic osseous abnormality. Review of the MIP images confirms the above findings.  IMPRESSION: 1. Positive for acute pulmonary embolus with lingula involvement. Lingula pulmonary infarct. Small layering left pleural effusion. No central thrombus and no CTA evidence of right heart strain. 2. Critical Value/emergent results were called by telephone at the time of interpretation on 11/19/2016 at 11:20 am to Dr. Nat Christen , who verbally acknowledged these results. Electronically Signed   By: Genevie Ann M.D.   On: 11/19/2016 11:20   US Transvaginal Non-ob  Result Date: 11/24/2016 CLINICAL DATA:  History of fibroids with heavy vaginal bleeding EXAM: TRANSABDOMINAL AND TRANSVAGINAL ULTRASOUND OF PELVIS TECHNIQUE: Both transabdominal and transvaginal ultrasound examinations of the pelvis were performed. Transabdominal technique was performed for global imaging of the pelvis including uterus, ovaries, adnexal regions, and pelvic cul-de-sac. It was necessary to proceed with endovaginal exam following the transabdominal exam to visualize the ovaries. COMPARISON:  09/05/2016 FINDINGS: Uterus Measurements: 10.2 x 5.1 x 5.4 cm. At least 2 uterine fibroids are noted. The largest of these measures 1.7 cm anteriorly near the fundus. Endometrium Thickness: 11 mm. Slight heterogeneity is again identified and stable from the prior exam. Right ovary Measurements: 2.7 x 1.7 x 1.8 cm. Normal appearance/no adnexal mass. Left ovary Measurements: 3.3 x 1.9 x 2.3 cm. Normal appearance/no adnexal mass. Other findings Minimal free fluid is noted likely physiologic in nature. IMPRESSION: Uterine fibroid change is again noted. Stable mildly prominent endometrium. No acute abnormality noted. Electronically Signed   By: Inez Catalina M.D.   On: 11/24/2016 08:33   US Pelvis Complete  Result Date: 11/24/2016 CLINICAL DATA:  History of fibroids with heavy vaginal bleeding EXAM: TRANSABDOMINAL AND TRANSVAGINAL ULTRASOUND OF PELVIS TECHNIQUE: Both transabdominal and transvaginal ultrasound examinations of the pelvis were performed.  Transabdominal technique was performed for global imaging of the pelvis including uterus, ovaries, adnexal regions, and pelvic cul-de-sac. It was necessary to proceed with endovaginal exam following the transabdominal exam to visualize the ovaries. COMPARISON:  09/05/2016 FINDINGS: Uterus Measurements: 10.2 x 5.1 x 5.4 cm. At least 2 uterine fibroids are noted. The largest of these measures 1.7 cm anteriorly near the fundus. Endometrium Thickness: 11 mm. Slight heterogeneity is again identified and stable from the prior exam. Right ovary Measurements: 2.7 x 1.7 x 1.8 cm. Normal appearance/no adnexal mass. Left ovary Measurements: 3.3 x 1.9 x 2.3 cm. Normal appearance/no adnexal mass. Other findings Minimal free fluid is noted likely physiologic in nature. IMPRESSION: Uterine fibroid change is again noted. Stable mildly prominent endometrium. No acute abnormality noted. Electronically Signed   By: Inez Catalina M.D.   On: 11/24/2016 08:33   Dg C-arm 1-60 Min  Result Date: 11/13/2016 CLINICAL DATA:  Anterior fusion at C5-6 and C6-7 EXAM: DG C-ARM 61-120 MIN COMPARISON:  MR C-spine of  10/09/2015 FINDINGS: C-arm fluoroscopy was provided during anterior fusion. 9 seconds of fluoroscopy time was recorded. IMPRESSION: C-arm fluoroscopy provided. Electronically Signed   By: Ivar Drape M.D.   On: 11/13/2016 15:51        Discharge Plan:  discharge to home  Disposition:     Signed: Benjiman Core for Rodell Perna MD 11/28/2016, 9:56 AM

## 2016-11-28 NOTE — Discharge Summary (Signed)
Physician Discharge Summary  Jennifer Mullins [id number] DOB: 04-Jan-1971 DOA: 11/23/2016  PCP: Chevis Pretty, FNP  Admit date: 11/23/2016 Discharge date: 11/28/2016  Recommendations for Outpatient Follow-up:  1. Continue apixaban 5 mg BID  Discharge Diagnoses:  Principal Problem:   Dysfunctional uterine bleeding Active Problems:   HTN (hypertension)   GAD (generalized anxiety disorder)   GERD (gastroesophageal reflux disease)   Pulmonary embolism (HCC)   Anticoagulated   Microcytic anemia    Discharge Condition: stable   Diet recommendation: as tolerated   History of present illness:  46 y.o.femalewith medical history significant for hypertension, type 2 diabetes mellitus, COPD, generalized anxiety disorder, uterine fibroids, and recent postoperative pulmonary embolism treated with IV heparin initially and then started on Eliquison 11/22/2016. Patient presented to ED with heavy vaginal bleeding shortly after started on anticoagulation. She reporteda history of uterine fibroids [place] Patient also reported increase in cough productive of clear sputum. No fevers or chills. She has chest pain with coughing.  In ED, patient was afebrile with good oxygen saturation. She was tachycardic with heart rate in 120s. The 12-lead EKG showed sinus tachycardia. Chest x-ray was notable for small left pleural effusion with lingular opacity likely representing an infarct given the known PE in that area. CBC was notable for hemoglobin of 9.3 and MCV of 77.2. Troponin was unremarkable and BNP was within normal limits. 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 There is progressive lingular infarction is noted as well.  Hospital Course:    Assessment & Plan:  Acute blood loss anemia from heavy vaginal bleeding / dysfunctional uterine bleed / hemoptysis  - Likely in  the setting of anticoagulation - Hemoglobin down to 7.6 on 1/9 so 1 U PRBC transfusion given 11/26/2016 - Vaginal bleeding better, hemoptysis better - Hgb 10.3 this am  Pulmonary embolism - Continue bronchodilators as needed - Continue eliquis 5 mg BID  Essential hypertension - Continue losartan and lasix  Dyslipidemia - Continue pravastatin   Diabetes mellitus without complications without long term insulin use - Continue metformin - Pt refused to have insulin sliding scale   Recent C-spine surgery - Seen by Dr. Lorin Mercy   Anxiety - Continue xanax 1 mg BID   DVT prophylaxis: on [facility] with eliquis  Code Status: full code  Family Communication: friend at the bedside this am    Consultants:   Pulmonary - phone call only   Procedures:   1 U PRBC 11/26/2016   Antimicrobials:   None    Signed:  Leisa Lenz, MD  [facility] 11/28/2016, 10:41 AM  Pager #: [phone]  Time spent in minutes: less than 30 minutes  Discharge Exam: Vitals:   11/27/16 2044 11/28/16 0357  BP: 109/65 122/69  Pulse: (!) 101 (!) 106  Resp: 18 20  Temp: 98.4 F (36.9 C) 97.3 F (36.3 C)   Vitals:   11/27/16 1335 11/27/16 2044 11/28/16 0357 11/28/16 0451  BP: 123/73 109/65 122/69   Pulse: (!) 106 (!) 101 (!) 106   Resp: 18 18 20    Temp: 97.6 F (36.4 C) 98.4 F (36.9 C) 97.3 F (36.3 C)   TempSrc: Oral Oral Oral   SpO2: 100% 96% 100% 100%  Weight:   62.5 kg (137 lb 11.2 oz)   Height:        General: Pt is alert, follows commands appropriately, not in acute distress; has  soft neck collar  Cardiovascular: Regular rate and rhythm, S1/S2 + Respiratory: Clear to auscultation bilaterally, no wheezing, no crackles, no rhonchi Abdominal: Soft, non tender, non distended, bowel sounds +, no guarding Extremities: no edema, no cyanosis, pulses palpable bilaterally DP and PT Neuro: Grossly nonfocal  Discharge Instructions  Discharge Instructions    Call MD for:   persistant nausea and vomiting    Complete by:  As directed    Call MD for:  redness, tenderness, or signs of infection (pain, swelling, redness, odor or green/yellow discharge around incision site)    Complete by:  As directed    Call MD for:  severe uncontrolled pain    Complete by:  As directed    Diet - low sodium heart healthy    Complete by:  As directed    Increase activity slowly    Complete by:  As directed      Allergies as of 11/28/2016      Reactions   Demerol [meperidine] Shortness Of Breath   FLUSHING AND SHORTNESS OF BREATH   Morphine And Related Shortness Of Breath   Flushed and hot hyper   Diltiazem Hcl Hives      Medication List    TAKE these medications   acetaminophen 325 MG tablet Commonly known as:  TYLENOL Take 2 tablets (650 mg total) by mouth every 6 (six) hours as needed for mild pain (or Fever >/= 101).   albuterol 108 (90 Base) MCG/ACT inhaler Commonly known as:  PROVENTIL HFA;VENTOLIN HFA Inhale 1-2 puffs into the lungs every 4 (four) hours as needed for wheezing or shortness of breath. Rescue inhaler What changed:  when to take this  reasons to take this   ALPRAZolam 1 MG tablet Commonly known as:  XANAX Take 1 tablet (1 mg total) by mouth 2 (two) times daily. What changed:  medication strength  how much to take  when to take this  reasons to take this   apixaban 5 MG Tabs tablet Commonly known as:  ELIQUIS Take 1 tablet (5 mg total) by mouth 2 (two) times daily. What changed:  how much to take  how to take this  when to take this  additional instructions   benzonatate 200 MG capsule Commonly known as:  TESSALON Take 1 capsule (200 mg total) by mouth 3 (three) times daily as needed for cough.   bimatoprost 0.01 % Soln Commonly known as:  LUMIGAN Place 1 drop into both eyes at bedtime.   budesonide-formoterol 160-4.5 MCG/ACT inhaler Commonly known as:  SYMBICORT Inhale 2 puffs into the lungs 2 (two) times daily.    carboxymethylcellulose 0.5 % Soln Commonly known as:  REFRESH PLUS Place 1 drop into both eyes 3 (three) times daily as needed (dryness).   chlorpheniramine-HYDROcodone 10-8 MG/5ML Suer Commonly known as:  TUSSIONEX Take 5 mLs by mouth every 12 (twelve) hours.   cyproheptadine 4 MG tablet Commonly known as:  PERIACTIN Take 1 tablet (4 mg total) by mouth at bedtime. What changed:  when to take this  reasons to take this   famotidine 20 MG tablet Commonly known as:  PEPCID Take 1 tablet (20 mg total) by mouth at bedtime.   ferrous sulfate 325 (65 FE) MG tablet Take 1 tablet (325 mg total) by mouth 2 (two) times daily with a meal.   fluticasone 50 MCG/ACT nasal spray Commonly known as:  FLONASE Place 2 sprays into both nostrils daily.   furosemide 20 MG tablet Commonly known as:  LASIX Take 1 tablet (20 mg total) by mouth daily.   hydrOXYzine 50 MG tablet Commonly known as:  ATARAX/VISTARIL Take 1 tablet (50 mg total) by mouth 3 (three) times daily as needed for itching.   ipratropium 0.03 % nasal spray Commonly known as:  ATROVENT Place 2 sprays into both nostrils every 12 (twelve) hours. What changed:  when to take this  reasons to take this   losartan 25 MG tablet Commonly known as:  COZAAR Take 1 tablet (25 mg total) by mouth daily.   metFORMIN 500 MG tablet Commonly known as:  GLUCOPHAGE Take 1 tablet (500 mg total) by mouth 2 (two) times daily with a meal. What changed:  when to take this  reasons to take this   montelukast 10 MG tablet Commonly known as:  SINGULAIR TAKE 1 TABLET BY MOUTH AT BEDTIME   Olopatadine HCl 0.6 % Soln Place 1 drop (1 puff total) into the nose every morning. What changed:  how to take this  when to take this  reasons to take this   oxyCODONE-acetaminophen 5-325 MG tablet Commonly known as:  PERCOCET/ROXICET Take 1 tablet by mouth every 6 (six) hours as needed for moderate pain.   pantoprazole 40 MG  tablet Commonly known as:  PROTONIX Take 1 tablet (40 mg total) by mouth daily.   polyethylene glycol packet Commonly known as:  MIRALAX / GLYCOLAX Take 17 g by mouth daily as needed for mild constipation.   pravastatin 40 MG tablet Commonly known as:  PRAVACHOL Take 1 tablet (40 mg total) by mouth daily.   pseudoephedrine-guaifenesin 60-600 MG 12 hr tablet Commonly known as:  MUCINEX D Take 1 tablet by mouth every 12 (twelve) hours as needed for congestion.      Follow-up Information    Mary-Margaret Hassell Done, FNP. Schedule an appointment as soon as possible for a visit in 1 week(s).   Specialty:  Family Medicine Contact information: [place] [address] [phone]            The results of significant diagnostics from this hospitalization (including imaging, microbiology, ancillary and laboratory) are listed below for reference.    Significant Diagnostic Studies: Dg Chest 2 View  Result Date: 11/24/2016 CLINICAL DATA:  Dyspnea. Recent hospitalization for pulmonary embolism. Hypertension. EXAM: CHEST  2 VIEW COMPARISON:  11/19/2016 FINDINGS: Persistent lingular opacity and small left pleural effusion. This may represent pulmonary infarction from the known emboli. Right lung is clear. Pulmonary vasculature is normal. Hilar and mediastinal contours are unremarkable. IMPRESSION: Small left effusion. Persistent lingular airspace opacity, possibly pulmonary infarction given the known recent emboli. Electronically Signed   By: Andreas Newport M.D.   On: 11/24/2016 00:56   Dg Chest 2 View  Result Date: 11/19/2016 CLINICAL DATA:  Left side chest pain. EXAM: CHEST  2 VIEW COMPARISON:  04/02/2016 FINDINGS: Airspace opacity noted at the left base concerning for pneumonia. Right lung is clear. Heart is normal size. No effusions or acute bony abnormality. IMPRESSION: Left basilar opacity concerning for pneumonia. Electronically Signed   By: Rolm Baptise M.D.   On:  11/19/2016 09:17   Dg Cervical Spine 2-3 Views  Result Date: 11/13/2016 CLINICAL DATA:  Anterior fusion at C5-6 and C6-7 EXAM: CERVICAL SPINE - 2-3 VIEW COMPARISON:  MR C-spine of 10/09/2015 FINDINGS: Two C-arm spot films show anterior fusion at C5-6 and [id number] with no complicating features noted. Interbody fusion plugs at C5-6 and C6-7 appear to be in good position with normal  alignment maintained. IMPRESSION: Anterior fusion at C5-6 and C6-7.  No complicating features. Electronically Signed   By: Ivar Drape M.D.   On: 11/13/2016 15:51   Ct Angio Chest Pe W And/or Wo Contrast  Result Date: 11/24/2016 CLINICAL DATA:  46 y/o F; shortness of breath and history of pulmonary embolus. EXAM: CT ANGIOGRAPHY CHEST WITH CONTRAST TECHNIQUE: Multidetector CT imaging of the chest was performed using the standard protocol during bolus administration of intravenous contrast. Multiplanar CT image reconstructions and MIPs were obtained to evaluate the vascular anatomy. CONTRAST:  100 cc Isovue 370 COMPARISON:  11/19/2016 CT angiogram of the chest. FINDINGS: Cardiovascular: Respiratory motion artifact. Satisfactory opacification of the pulmonary arteries. There is persistent occlusion of the left upper lobe lingular branch pulmonary artery. No new pulmonary embolus is identified. Normal heart size.  No pericardial effusion. Mediastinum/Nodes: No enlarged mediastinal, hilar, or axillary lymph nodes. Thyroid gland, trachea, and esophagus demonstrate no significant findings. Lungs/Pleura: Progressive opacification of the lingula likely represents worsening pulmonary infarct. No new airspace consolidation is identified. Trace left pleural effusion. Upper Abdomen: No acute abnormality. Musculoskeletal: No chest wall abnormality. No acute or significant osseous findings. Review of the MIP images confirms the above findings. IMPRESSION: 1. 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 2. Extensive  respiratory motion artifact. No new pulmonary embolus identified. 3. 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 No new airspace opacity. These results were called by telephone at the time of interpretation on 11/24/2016 at 3:04 am to Dr. Ezequiel Essex , who verbally acknowledged these results. 1. Stable trace left pleural effusion. Electronically Signed   By: Kristine Garbe M.D.   On: 11/24/2016 03:06   Ct Angio Chest Pe W And/or Wo Contrast  Result Date: 11/19/2016 CLINICAL DATA:  46 year old female status post cervical spine she surgery 6 days ago with left anterior chest and shoulder pain. Hemoptysis for 3 days. Initial encounter. EXAM: CT ANGIOGRAPHY CHEST WITH CONTRAST TECHNIQUE: Multidetector CT imaging of the chest was performed using the standard protocol during bolus administration of intravenous contrast. Multiplanar CT image reconstructions and MIPs were obtained to evaluate the vascular anatomy. CONTRAST:  100 mL Isovue 370 COMPARISON:  Chest radiographs 0855 hours today. [facility] Chest CTA 06/21/2013 FINDINGS: Cardiovascular: Good contrast bolus timing in the pulmonary arterial tree. Positive for pulmonary emboli. Left lingula and possibly also anterior basal segment occlusive pulmonary thrombus. No left upper lobe involvement identified. No central or saddle embolus. No right lung involvement identified. The interventricular septum has a normal configuration without CT evidence of right heart strain. RV / LV ratio = 0.7. No pericardial effusion. Negative visualized aorta. Mediastinum/Nodes: No mediastinal lymphadenopathy. Lungs/Pleura: Major airways are patent. Small layering left pleural effusion. Confluent ground-glass opacity in the posterolateral lingula is observed by the most conspicuous area of occlusive thrombus and is compatible with pulmonary infarct (series 9, image 61). There is otherwise dependent opacity in the lower lobe which may simply reflect  atelectasis. In the contralateral right lung there is mild dependent atelectasis. Upper Abdomen: Negative visualized liver, spleen, pancreas, left adrenal gland, left kidney, and bowel in the upper abdomen. Other findings: Mild postoperative prevertebral fluid or inflammation at the cervicothoracic junction. Visualized thoracic inlet otherwise within normal limits. Musculoskeletal: Sequelae of lower cervical ACDF to the C7 level. Visible hardware and cervical vertebrae appear intact. Minimal thoracic scoliosis. No acute thoracic osseous abnormality. Review of the MIP images confirms the above findings. IMPRESSION: 1. Positive for acute pulmonary  embolus with lingula involvement. Lingula pulmonary infarct. Small layering left pleural effusion. No central thrombus and no CTA evidence of right heart strain. 2. Critical Value/emergent results were called by telephone at the time of interpretation on 11/19/2016 at 11:20 am to Dr. Nat Christen , who verbally acknowledged these results. Electronically Signed   By: Genevie Ann M.D.   On: 11/19/2016 11:20   US Transvaginal Non-ob  Result Date: 11/24/2016 CLINICAL DATA:  History of fibroids with heavy vaginal bleeding EXAM: TRANSABDOMINAL AND TRANSVAGINAL ULTRASOUND OF PELVIS TECHNIQUE: Both transabdominal and transvaginal ultrasound examinations of the pelvis were performed. Transabdominal technique was performed for global imaging of the pelvis including uterus, ovaries, adnexal regions, and pelvic cul-de-sac. It was necessary to proceed with endovaginal exam following the transabdominal exam to visualize the ovaries. COMPARISON:  09/05/2016 FINDINGS: Uterus Measurements: 10.2 x 5.1 x 5.4 cm. At least 2 uterine fibroids are noted. The largest of these measures 1.7 cm anteriorly near the fundus. Endometrium Thickness: 11 mm. Slight heterogeneity is again identified and stable from the prior exam. Right ovary Measurements: 2.7 x 1.7 x 1.8 cm. Normal appearance/no adnexal mass.  Left ovary Measurements: 3.3 x 1.9 x 2.3 cm. Normal appearance/no adnexal mass. Other findings Minimal free fluid is noted likely physiologic in nature. IMPRESSION: Uterine fibroid change is again noted. Stable mildly prominent endometrium. No acute abnormality noted. Electronically Signed   By: Inez Catalina M.D.   On: 11/24/2016 08:33   US Pelvis Complete  Result Date: 11/24/2016 CLINICAL DATA:  History of fibroids with heavy vaginal bleeding EXAM: TRANSABDOMINAL AND TRANSVAGINAL ULTRASOUND OF PELVIS TECHNIQUE: Both transabdominal and transvaginal ultrasound examinations of the pelvis were performed. Transabdominal technique was performed for global imaging of the pelvis including uterus, ovaries, adnexal regions, and pelvic cul-de-sac. It was necessary to proceed with endovaginal exam following the transabdominal exam to visualize the ovaries. COMPARISON:  09/05/2016 FINDINGS: Uterus Measurements: 10.2 x 5.1 x 5.4 cm. At least 2 uterine fibroids are noted. The largest of these measures 1.7 cm anteriorly near the fundus. Endometrium Thickness: 11 mm. Slight heterogeneity is again identified and stable from the prior exam. Right ovary Measurements: 2.7 x 1.7 x 1.8 cm. Normal appearance/no adnexal mass. Left ovary Measurements: 3.3 x 1.9 x 2.3 cm. Normal appearance/no adnexal mass. Other findings Minimal free fluid is noted likely physiologic in nature. IMPRESSION: Uterine fibroid change is again noted. Stable mildly prominent endometrium. No acute abnormality noted. Electronically Signed   By: Inez Catalina M.D.   On: 11/24/2016 08:33   Dg C-arm 1-60 Min  Result Date: 11/13/2016 CLINICAL DATA:  Anterior fusion at C5-6 and C6-7 EXAM: DG C-ARM 61-120 MIN COMPARISON:  MR C-spine of 10/09/2015 FINDINGS: C-arm fluoroscopy was provided during anterior fusion. 9 seconds of fluoroscopy time was recorded. IMPRESSION: C-arm fluoroscopy provided. Electronically Signed   By: Ivar Drape M.D.   On: 11/13/2016 15:51     Microbiology: Recent Results (from the past 240 hour(s))  MRSA PCR Screening     Status: None   Collection Time: 11/19/16  9:44 PM  Result Value Ref Range Status   MRSA by PCR NEGATIVE NEGATIVE Final  Rapid strep screen     Status: None   Collection Time: 11/24/16  3:40 AM  Result Value Ref Range Status   Streptococcus, Group A Screen (Direct) NEGATIVE NEGATIVE Final  Culture, group A strep     Status: None   Collection Time: 11/24/16  3:40 AM  Result Value Ref Range Status  Specimen Description THROAT  Final   Special Requests NONE Reflexed from [id number]  Final   Culture NO GROUP A STREP (S.PYOGENES) ISOLATED  Final   Report Status 11/26/2016 FINAL  Final     Labs: Basic Metabolic Panel:  Recent Labs Lab 11/22/16 0632 11/23/16 2324  NA 137 136  K 4.1 3.8  CL 102 100*  CO2 27 25  GLUCOSE 97 108*  BUN 8 10  CREATININE 0.62 0.58  CALCIUM 9.0 9.5   Liver Function Tests: No results for input(s): AST, ALT, ALKPHOS, BILITOT, PROT, ALBUMIN in the last 168 hours. No results for input(s): LIPASE, AMYLASE in the last 168 hours. No results for input(s): AMMONIA in the last 168 hours. CBC:  Recent Labs Lab 11/23/16 2324  11/25/16 0325 11/25/16 0823 11/26/16 0352 11/27/16 0248 11/28/16 0409  WBC 7.8  --  5.1  --  5.7 5.0 5.6  NEUTROABS 5.6  --   --   --   --   --   --   HGB 9.3*  < > 7.3* 8.0* 7.6* 8.5* 10.3*  HCT 29.2*  < > 23.5* 26.1* 25.1* 26.4* 32.0*  MCV 77.2*  --  78.6  --  78.2 78.6 80.4  PLT 401*  --  300  --  373 316 348  < > = values in this interval not displayed. Cardiac Enzymes:  Recent Labs Lab 11/23/16 2324  TROPONINI <0.03   BNP: BNP (last 3 results)  Recent Labs  11/23/16 2325  BNP 7.1    ProBNP (last 3 results) No results for input(s): PROBNP in the last 8760 hours.  CBG:  Recent Labs Lab 11/26/16 0706 11/26/16 2115 11/27/16 0025 11/27/16 0612 11/28/16 0623  GLUCAP 80 137* 102* 85 104*

## 2016-11-28 NOTE — Care Management Note (Addendum)
Case Management Note Jennifer Gibbons RN, BSN Unit 2W-Case Manager [phone]  Patient Details  Name: Jennifer Mullins MRN: [id number] Date of Birth: [birth year]  Subjective/Objective:   Pt admitted with uterine bleeding                  Action/Plan: PTA pt lived at home- was active with [facility] for HHPT- will need resumption order for discharge- has RW and 3n1 at home already, and was recently discharged on Eliquis- and was previously given assist cards for Eliquis on last admission.   Expected Discharge Date:      11/28/16            Expected Discharge Plan:  [place]  In-House Referral:     Discharge planning Services  CM Consult  Post Acute Care Choice:  Home Health, Resumption of Svcs/PTA Provider Choice offered to:  Patient  DME Arranged:  N/A DME Agency:  NA  HH Arranged:  PT [place] Agency:  [facility]  Status of Service:  Completed, signed off  If discussed at [place] of Stay Meetings, dates discussed:    Discharge Disposition: home with home health   Additional Comments:  11/28/16- [place] RN, CM- pt for d/c home today- MD to place resumption order for HHPT - have notified Jennifer Mullins with [facility] regarding d/c for day and resumption of care.  1230- Noted that order had been placed for home 02- spoke with bedside RN- pt has been on RA- stats 96% or greater- does not qualify for home 02- per [facility] with [place] would need be be less than 90% for insurance to cover home 02. Spoke with pt -explained to pt that insurance will not cover home 02- pt requested copy of home 02 order to take to PCP- copy provided- explained to pt that her PCP would have to qualify her and write new order for home 02 that order from hospital would not work. Pt voiced understanding.   Dawayne Patricia, RN 11/28/2016, 11:37 AM

## 2016-11-28 NOTE — Progress Notes (Signed)
Rn talked to patient about dr's orders for SW for disability. Patient stated that she is already handling the disability paperwork with her family doctor. SW stated that there is nothing from their end that they can do for her.patient verbalized understanding of discharge instructions and is able to teach back info. Patient will leave with spouse in wheelchair transport to front lobby.

## 2016-12-02 ENCOUNTER — Encounter: Payer: Self-pay | Admitting: *Deleted

## 2016-12-02 ENCOUNTER — Ambulatory Visit: Payer: BLUE CROSS/BLUE SHIELD | Admitting: Nurse Practitioner

## 2016-12-02 ENCOUNTER — Encounter: Payer: Self-pay | Admitting: Nurse Practitioner

## 2016-12-02 ENCOUNTER — Ambulatory Visit (INDEPENDENT_AMBULATORY_CARE_PROVIDER_SITE_OTHER): Payer: BLUE CROSS/BLUE SHIELD | Admitting: Nurse Practitioner

## 2016-12-02 ENCOUNTER — Encounter (INDEPENDENT_AMBULATORY_CARE_PROVIDER_SITE_OTHER): Payer: Self-pay

## 2016-12-02 VITALS — BP 129/81 | HR 111 | Temp 96.7°F | Ht 62.0 in | Wt 142.0 lb

## 2016-12-02 DIAGNOSIS — Z09 Encounter for follow-up examination after completed treatment for conditions other than malignant neoplasm: Secondary | ICD-10-CM

## 2016-12-02 DIAGNOSIS — I2699 Other pulmonary embolism without acute cor pulmonale: Secondary | ICD-10-CM | POA: Diagnosis not present

## 2016-12-02 NOTE — Addendum Note (Signed)
Addended by: Rolena Infante on: 12/02/2016 03:29 PM   Modules accepted: Orders

## 2016-12-02 NOTE — Progress Notes (Signed)
Ordered overnight pulse oximetry from [employer]

## 2016-12-02 NOTE — Progress Notes (Addendum)
   Subjective:    Patient ID: Jennifer Mullins, female    DOB: 1971/09/09, 46 y.o.   MRN: [id number]  HPI Patient in today for hospital follow up. She had ACF on December 27,2017. She did wel form surgery. Became SOB 11/19/16 and was taken by EMS to hospital and they admitted her for PE in left lung. Stayed a couple of days and got better. Had to go back to hospital on 11/23/16 with continued cough and spitting up blood. She ended up having to have blood transfusion. She says she is doing better. She is on eliquis and will need to stay on that for at least 6 months. She complains of SOB all the time, but seems to be worse at night.    Review of Systems  Constitutional: Negative.   HENT: Negative.   Respiratory: Positive for shortness of breath (still some SOB with exertion).   Cardiovascular: Negative.   Gastrointestinal: Negative.   Genitourinary: Negative.   Neurological: Negative.   Psychiatric/Behavioral: Negative.   All other systems reviewed and are negative.      Objective:   Physical Exam  Constitutional: She appears well-developed and well-nourished. No distress.  Cardiovascular: Normal rate, regular rhythm and normal heart sounds.   Pulmonary/Chest: Effort normal and breath sounds normal. No respiratory distress. She has no wheezes. She has no rales.  Slight increase in breath rate.  Musculoskeletal:  Soft cervical brace is in place  Skin: Skin is warm.  Neck incision- wound edges well approximated- no erythema or drainage.  Psychiatric: She has a normal mood and affect. Her behavior is normal. Judgment and thought content normal.   BP 129/81   Pulse (!) 111   Temp (!) 96.7 F (35.9 C) (Oral)   Ht 5\' 2"  (1.575 m)   Wt 142 lb (64.4 kg)   LMP 11/24/2016   SpO2 100%   BMI 25.97 kg/m       Assessment & Plan:   1. Hospital discharge follow-up   2. Other acute pulmonary embolism without acute cor pulmonale ([place])    Orders Placed This Encounter  Procedures  . Pulse  oximetry screening at 24 hours of age per [place] records reviewed Deep breathes Rest RTOprn  Mary-Margaret Hassell Done, FNP

## 2016-12-02 NOTE — Patient Instructions (Signed)
Pulmonary Embolism °A pulmonary embolism (PE) is a sudden blockage or decrease of blood flow in one lung or both lungs. Most blockages come from a blood clot that travels from the legs or the pelvis to the lungs. PE is a dangerous and potentially life-threatening condition if it is not treated right away. °What are the causes? °A pulmonary embolism occurs most commonly when a blood clot travels from one of your veins to your lungs. Rarely, PE is caused by air, fat, amniotic fluid, or part of a tumor traveling through your veins to your lungs. °What increases the risk? °A PE is more likely to develop in: °· People who smoke. °· People who are older, especially over 60 years of age. °· People who are overweight (obese). °· People who sit or lie still for a long time, such as during long-distance travel (over 4 hours), bed rest, hospitalization, or during recovery from certain medical conditions like a stroke. °· People who do not engage in much physical activity (sedentary lifestyle). °· People who have chronic breathing disorders. °· People who have a personal or family history of blood clots or blood clotting disease. °· People who have peripheral vascular disease (PVD), diabetes, or some types of cancer. °· People who have heart disease, especially if the person had a recent heart attack or has congestive heart failure. °· People who have neurological diseases that affect the legs (leg paresis). °· People who have had a traumatic injury, such as breaking a hip or leg. °· People who have recently had major or lengthy surgery, especially on the hip, knee, or abdomen. °· People who have had a central line placed inside a large vein. °· People who take medicines that contain the hormone estrogen. These include birth control pills and hormone replacement therapy. °· Pregnancy or during childbirth or the postpartum period. ° °What are the signs or symptoms? °The symptoms of a PE usually start suddenly and  include: °· Shortness of breath while active or at rest. °· Coughing or coughing up blood or blood-tinged mucus. °· Chest pain that is often worse with deep breaths. °· Rapid or irregular heartbeat. °· Feeling light-headed or dizzy. °· Fainting. °· Feeling anxious. °· Sweating. ° °There may also be pain and swelling in a leg if that is where the blood clot started. °These symptoms may represent a serious problem that is an emergency. Do not wait to see if the symptoms will go away. Get medical help right away. Call your local emergency services (911 in the [place] Do not drive yourself to the hospital. °How is this diagnosed? °Your health care provider will take a medical history and perform a physical exam. You may also have other tests, including: °· Blood tests to assess the clotting properties of your blood, assess oxygen levels in your blood, and find blood clots. °· Imaging tests, such as CT, ultrasound, MRI, X-ray, and other tests to see if you have clots anywhere in your body. °· An electrocardiogram (ECG) to look for heart strain from blood clots in the lungs. ° °How is this treated? °The main goals of PE treatment are: °· To stop a blood clot from growing larger. °· To stop new blood clots from forming. ° °The type of treatment that you receive depends on many factors, such as the cause of your PE, your risk for bleeding or developing more clots, and other medical conditions that you have. Sometimes, a combination of treatments is necessary. °This condition may be treated with: °· Medicines, including newer oral blood thinners (  anticoagulants), warfarin, low molecular weight heparins, thrombolytics, or heparins. °· Wearing compression stockings or using different types of devices. °· Surgery (rare) to remove the blood clot or to place a filter in your abdomen to stop the blood clot from traveling to your lungs. ° °Treatments for a PE are often divided into immediate treatment, long-term treatment (up to 3  months after PE), and extended treatment (more than 3 months after PE). Your treatment may continue for several months. This is called maintenance therapy, and it is used to prevent the forming of new blood clots. You can work with your health care provider to choose the treatment program that is best for you. °What are anticoagulants? °Anticoagulants are medicines that treat PEs. They can stop current blood clots from growing and stop new clots from forming. They cannot dissolve existing clots. Your body dissolves clots by itself over time. Anticoagulants are given by mouth, by injection, or through an IV tube. °What are thrombolytics? °Thrombolytics are clot-dissolving medicines that are used to dissolve a PE. They carry a high risk of bleeding, so they tend to be used only in severe cases or if you have very low blood pressure. °Follow these instructions at home: °If you are taking a newer oral anticoagulant: °· Take the medicine every single day at the same time each day. °· Understand what foods and drugs interact with this medicine. °· Understand that there are no regular blood tests required when using this medicine. °· Understand the side effects of this medicine, including excessive bruising or bleeding. Ask your health care provider or pharmacist about other possible side effects. °If you are taking warfarin: °· Understand how to take warfarin and know which foods can affect how warfarin works in your body. °· Understand that it is dangerous to take too much or too little warfarin. Too much warfarin increases the risk of bleeding. Too little warfarin continues to allow the risk for blood clots. °· Follow your PT and INR blood testing schedule. The PT and INR results allow your health care provider to adjust your dose of warfarin. It is very important that you have your PT and INR tested as often as told by your health care provider. °· Avoid major changes in your diet, or tell your health care provider  before you change your diet. Arrange a visit with a registered dietitian to answer your questions. Many foods, especially foods that are high in vitamin K, can interfere with warfarin and affect the PT and INR results. Eat a consistent amount of foods that are high in vitamin K, such as: °? Spinach, kale, broccoli, cabbage, collard greens, turnip greens, Brussels sprouts, peas, cauliflower, seaweed, and parsley. °? Beef liver and pork liver. °? Green tea. °? Soybean oil. °· Tell your health care provider about any and all medicines, vitamins, and supplements that you take, including aspirin and other over-the-counter anti-inflammatory medicines. Be especially cautious with aspirin and anti-inflammatory medicines. Do not take those before you ask your health care provider if it is safe to do so. This is important because many medicines can interfere with warfarin and affect the PT and INR results. °· Do not start or stop taking any over-the-counter or prescription medicine unless your health care provider or pharmacist tells you to do so. °If you take warfarin, you will also need to do these things: °· Hold pressure over cuts for longer than usual. °· Tell your dentist and other health care providers that you are taking warfarin before you have   any procedures in which bleeding may occur. °· Avoid alcohol or drink very small amounts. Tell your health care provider if you change your alcohol intake. °· Do not use tobacco products, including cigarettes, chewing tobacco, and e-cigarettes. If you need help quitting, ask your health care provider. °· Avoid contact sports. ° °General instructions °· Take over-the-counter and prescription medicines only as told by your health care provider. Anticoagulant medicines can have side effects, including easy bruising and difficulty stopping bleeding. If you are prescribed an anticoagulant, you will also need to do these things: °? Hold pressure over cuts for longer than  usual. °? Tell your dentist and other health care providers that you are taking anticoagulants before you have any procedures in which bleeding may occur. °? Avoid contact sports. °· Wear a medical alert bracelet or carry a medical alert card that says you have had a PE. °· Ask your health care provider how soon you can go back to your normal activities. Stay active to prevent new blood clots from forming. °· Make sure to exercise while traveling or when you have been sitting or standing for a long period of time. It is very important to exercise. Exercise your legs by walking or by tightening and relaxing your leg muscles often. Take frequent walks. °· Wear compression stockings as told by your health care provider to help prevent more blood clots from forming. °· Do not use tobacco products, including cigarettes, chewing tobacco, and e-cigarettes. If you need help quitting, ask your health care provider. °· Keep all follow-up appointments with your health care provider. This is important. °How is this prevented? °Take these actions to decrease your risk of developing another PE: °· Exercise regularly. For at least 30 minutes every day, engage in: °? Activity that involves moving your arms and legs. °? Activity that encourages good blood flow through your body by increasing your heart rate. °· Exercise your arms and legs every hour during long-distance travel (over 4 hours). Drink plenty of water and avoid drinking alcohol while traveling. °· Avoid sitting or lying in bed for long periods of time without moving your legs. °· Maintain a weight that is appropriate for your height. Ask your health care provider what weight is healthy for you. °· If you are a woman who is over 35 years of age, avoid unnecessary use of medicines that contain estrogen. These include birth control pills. °· Do not smoke, especially if you take estrogen medicines. If you need help quitting, ask your health care provider. °· If you are at  very high risk for PE, wear compression stockings. °· If you recently had a PE, have regularly scheduled ultrasound testing on your legs to check for new blood clots. ° °If you are hospitalized, prevention measures may include: °· Early walking after surgery, as soon as your health care provider says that it is safe. °· Receiving anticoagulants to prevent blood clots. If you cannot take anticoagulants, other options may be available, such as wearing compression stockings or using different types of devices. ° °Get help right away if: °· You have new or increased pain, swelling, or redness in an arm or leg. °· You have numbness or tingling in an arm or leg. °· You have shortness of breath while active or at rest. °· You have chest pain. °· You have a rapid or irregular heartbeat. °· You feel light-headed or dizzy. °· You cough up blood. °· You notice blood in your vomit, bowel movement, or   urine. °· You have a fever. °These symptoms may represent a serious problem that is an emergency. Do not wait to see if the symptoms will go away. Get medical help right away. Call your local emergency services (911 in the [place] Do not drive yourself to the hospital. °This information is not intended to replace advice given to you by your health care provider. Make sure you discuss any questions you have with your health care provider. °Document Released: 11/01/2000 Document Revised: 04/11/2016 Document Reviewed: 03/01/2015 °Elsevier Interactive Patient Education © 2017 [employer] ° °

## 2016-12-03 ENCOUNTER — Other Ambulatory Visit: Payer: Self-pay

## 2016-12-03 DIAGNOSIS — I2699 Other pulmonary embolism without acute cor pulmonale: Secondary | ICD-10-CM

## 2016-12-10 ENCOUNTER — Ambulatory Visit (INDEPENDENT_AMBULATORY_CARE_PROVIDER_SITE_OTHER): Payer: Self-pay

## 2016-12-10 ENCOUNTER — Ambulatory Visit (INDEPENDENT_AMBULATORY_CARE_PROVIDER_SITE_OTHER): Payer: BLUE CROSS/BLUE SHIELD | Admitting: Orthopaedic Surgery

## 2016-12-10 ENCOUNTER — Encounter (INDEPENDENT_AMBULATORY_CARE_PROVIDER_SITE_OTHER): Payer: Self-pay | Admitting: Orthopaedic Surgery

## 2016-12-10 ENCOUNTER — Telehealth: Payer: Self-pay | Admitting: Nurse Practitioner

## 2016-12-10 VITALS — BP 136/82 | HR 93 | Ht 62.0 in | Wt 142.0 lb

## 2016-12-10 DIAGNOSIS — M47812 Spondylosis without myelopathy or radiculopathy, cervical region: Secondary | ICD-10-CM

## 2016-12-10 DIAGNOSIS — I2699 Other pulmonary embolism without acute cor pulmonale: Secondary | ICD-10-CM

## 2016-12-10 NOTE — Progress Notes (Signed)
Office Visit Note   Patient: Jennifer Mullins           Date of Birth: Feb 07, 1971           MRN: [id number] Visit Date: 12/10/2016              Requested by: Chevis Pretty, [place], [place] [id number] PCP: Chevis Pretty, FNP   Assessment & Plan: Visit Diagnoses:  1. Spondylosis without myelopathy or radiculopathy, cervical region   2. Other acute pulmonary embolism without acute cor pulmonale (HCC)     Plan: Patient had the DVT and acute PE after her anterior cervical discectomy and fusion. She was asymptomatic before the procedure as far as DVT is concerned. She does have history of COPD's had problems with asthma as well as anxiety. X-rays show good position of plate and screws as well as graft. She is on our request. We will recheck her again in 3 weeks with lateral flexion-extension x-ray on return. She has lost her extra collar and asked her color was given so she can shower.  Follow-Up Instructions: No Follow-up on file.   Orders:  Orders Placed This Encounter  Procedures  . XR Cervical Spine 2 or 3 views   No orders of the defined types were placed in this encounter.     Procedures: No procedures performed   Clinical Data: No additional findings.   Subjective: Chief Complaint  Patient presents with  . Neck - Routine Post Op    Patient returns for post op visit. She is status post C5-6, C6-7 ACD&F, Allograft, and Plate 11/13/2016. She had complications with pulmonary embolus after surgery and was admitted to the hospital. After discharge home, she started vomiting blood and had to be readmitted and have blood transfusion. She is taking Eliquis. She is taking Oxycodone for pain. She does request a refill. Patient is complaining of continued neck pain. She also is having pain in the left side of her neck and left anterior shoulder with breathing in and out. She states that the incision has had an odor coming from it. Bandage removed  today. Patient does not have steri strips on incision. She does not have any drainage noted. She continues to cough up black "soot".     Review of Systems unchanged from surgery other than DVT and PE.   Objective: Vital Signs: BP 136/82   Pulse 93   Ht 5\' 2"  (1.575 m)   Wt 142 lb (64.4 kg)   LMP 11/24/2016   BMI 25.97 kg/m   Physical Exam neck incision well healed. She remains anxious.  Ortho Exam  Specialty Comments:  No specialty comments available.  Imaging: Xr Cervical Spine 2 Or 3 Views  Result Date: 12/10/2016 2 views cervical spine obtained. This shows two-level cervical fusion good position of graft plate and screws. Impression: Postop C5-6 C6-7 ACD F satisfactory postop x-rays. Prevertebral swelling is decreasing.    PMFS History: Patient Active Problem List   Diagnosis Date Noted  . Pulmonary embolism ([facility] [place]) 11/24/2016  . Dysfunctional uterine bleeding 11/24/2016  . Anticoagulated 11/24/2016  . Microcytic anemia 11/24/2016  . Pulmonary embolus ([facility])   . Tachycardia   . Vaginal bleeding   . Pulmonary emboli ([facility]) 11/19/2016  . Spondylosis without myelopathy or radiculopathy, cervical region 11/13/2016  . Allergic rhinoconjunctivitis 07/28/2015  . Chronic headache 07/28/2015  . PCOS (polycystic ovarian syndrome) 06/20/2015  . 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 Hyperlipidemia LDL goal <100 06/20/2015  .  GAD (generalized anxiety disorder) 05/18/2014  . GERD (gastroesophageal reflux disease) 05/18/2014  . Body fluid retention 07/07/2013  . Chronic sinusitis 07/02/2013  . Extrinsic asthma 05/20/2013  . HTN (hypertension) 05/20/2013  . Diabetes ([facility]) 05/20/2013   Past Medical History:  Diagnosis Date  . Anemia   . Anxiety   . Arthritis   . Asthma   . Bilateral calcaneal spurs   . Bilateral polycystic ovarian syndrome   . Bulging lumbar disc   . Carpal tunnel syndrome, bilateral   . COPD (chronic obstructive pulmonary disease) (HCC)     BRONCHITIS  . Diabetes mellitus without complication ([facility])    TYPE 2   DX  4-5 YRS AGO  . Fibroids    uterine  . GAD (generalized anxiety disorder)   . GERD (gastroesophageal reflux disease)    TAKES PRESCRIPTION MEDS  . Glaucoma    BOTH EYES  . Hyperlipidemia   . Hypertension   . Hypothyroidism    NODULES ON THYROID  . PONV (postoperative nausea and vomiting)   . Pulmonary embolism ([facility])   . Tachycardia     Family History  Problem Relation Age of Onset  . Hypertension Mother   . Hypertension Father     Past Surgical History:  Procedure Laterality Date  . ANTERIOR CERVICAL DECOMP/DISCECTOMY FUSION N/A 11/13/2016   Procedure: Cervical five-six, Cervical six-seven Anterior Cervical Discectomy and Fusion, Allograft, Plate;  Surgeon: Marybelle Killings, MD;  Location: [facility];  Service: Orthopedics;  Laterality: N/A;  . BREAST SURGERY    . CARPAL TUNNEL RELEASE    . DILATION AND CURETTAGE OF UTERUS    . EXPLORATORY LAPAROTOMY    . NASAL SINUS SURGERY  07/22/13   Dr. Redmond Pulling in [place] History  . Unemployed    Social History Main Topics  . Smoking status: Former Smoker    Packs/day: 0.25    Years: 5.00    Types: Cigarettes    Quit date: 11/18/1989  . Smokeless tobacco: Never Used  . Alcohol use Yes     Comment: occasional  . Drug use: No  . Sexual activity: Not on file

## 2016-12-10 NOTE — Telephone Encounter (Signed)
Order was sent for pulse oximetry on 12/03/16, resent today, spoke to [facility]

## 2016-12-11 ENCOUNTER — Telehealth (INDEPENDENT_AMBULATORY_CARE_PROVIDER_SITE_OTHER): Payer: Self-pay | Admitting: Orthopaedic Surgery

## 2016-12-11 NOTE — Telephone Encounter (Signed)
PT CALLED BACK ABOUT THIS STATING SHE IS HURTING FROM THE THERAPY AND IS NEEDING THIS MEDICATION.

## 2016-12-11 NOTE — Telephone Encounter (Signed)
She called again. OK for norco 5/325    # 15 tabs.      1 po bid prn . She can pick up in [place] on Thursday.

## 2016-12-11 NOTE — Telephone Encounter (Signed)
I called patient and advised, message is waiting for Dr. Lorin Mercy approval. She states that she thought she had enough, but with trying to walk more with her walker and doing physical therapy, she needs a refill due to the pain. I did advise we would be in [place] office tomorrow and that I would call as soon as I have advice of Dr. Lorin Mercy.

## 2016-12-11 NOTE — Telephone Encounter (Signed)
I called. 3  wks post surgery.  She should stop narcotic meds now. FYI

## 2016-12-11 NOTE — Telephone Encounter (Signed)
Pt requesting refill of oxycodone.  Pt stated she has 2 left.  [phone]

## 2016-12-11 NOTE — Telephone Encounter (Signed)
Please advise 

## 2016-12-12 MED ORDER — HYDROCODONE-ACETAMINOPHEN 5-325 MG PO TABS: 1.0000 | ORAL_TABLET | Freq: Two times a day (BID) | ORAL | 0 refills | Status: DC | PRN

## 2016-12-12 NOTE — Telephone Encounter (Signed)
Script entered into system.  Written on script pad. Patient to pick up in [place] office.

## 2016-12-14 ENCOUNTER — Other Ambulatory Visit: Payer: Self-pay | Admitting: Nurse Practitioner

## 2016-12-15 ENCOUNTER — Other Ambulatory Visit: Payer: Self-pay | Admitting: Nurse Practitioner

## 2016-12-15 DIAGNOSIS — E785 Hyperlipidemia, unspecified: Secondary | ICD-10-CM

## 2016-12-17 ENCOUNTER — Ambulatory Visit: Payer: BLUE CROSS/BLUE SHIELD | Admitting: Nurse Practitioner

## 2016-12-19 ENCOUNTER — Telehealth: Payer: Self-pay | Admitting: Nurse Practitioner

## 2016-12-19 NOTE — Telephone Encounter (Signed)
That is not that high and do not need to increase meds at this time.

## 2016-12-20 NOTE — Telephone Encounter (Signed)
Returned patient's phone call. Patient stated that she had a headache, disoriented and elevated BP 139/90.  Patient states that she took losartan 25mg  and lasix 40mg  yesterday and started feeling better.  Patient states that Dr. Increased Losartan to 50 mg in the hospital.  Patient is feeling better today! Advised patient to check BP and to call back with results. Advised patient to get a BP cuff to keep at home and to check a few time daily. Patient verbalized understands.

## 2016-12-21 ENCOUNTER — Encounter: Payer: Self-pay | Admitting: Family

## 2016-12-21 ENCOUNTER — Ambulatory Visit (INDEPENDENT_AMBULATORY_CARE_PROVIDER_SITE_OTHER): Payer: BLUE CROSS/BLUE SHIELD | Admitting: Family

## 2016-12-21 VITALS — BP 138/79 | HR 91 | Temp 98.1°F | Ht 62.0 in | Wt 142.0 lb

## 2016-12-21 DIAGNOSIS — N920 Excessive and frequent menstruation with regular cycle: Secondary | ICD-10-CM | POA: Diagnosis not present

## 2016-12-21 DIAGNOSIS — Z9189 Other specified personal risk factors, not elsewhere classified: Secondary | ICD-10-CM

## 2016-12-21 MED ORDER — BENZONATATE 200 MG PO CAPS: 200.0000 mg | ORAL_CAPSULE | Freq: Three times a day (TID) | ORAL | 1 refills | Status: DC | PRN

## 2016-12-21 MED ORDER — HYDROCOD POLST-CPM POLST ER 10-8 MG/5ML PO SUER: 5.0000 mL | Freq: Two times a day (BID) | ORAL | 0 refills | Status: DC

## 2016-12-21 NOTE — Progress Notes (Signed)
   Subjective:    Patient ID: Jennifer Mullins, female    DOB: 06/09/1971, 46 y.o.   MRN: [id number]  HPI Pt presents to the office today with menorrhagia for the last two days. Pt states she had surgery on her neck on  12/27 and was discharged on 12/28. Then developed PE on 01/02 and was started on heparin drip and was discharged on Eliquis on 1/11. Pt states she was nervous that her period bleeding was related to the Eliqus. PT states she has intermittent cramping a 7 out 10. Pt states she feels fatigued. Pt reports going through over 18 super pads over the last two days. Pt stats since taking a hot shower this morning her bleeding has improved.    Review of Systems  Constitutional: Positive for fatigue.  Respiratory: Negative for cough and shortness of breath.   Cardiovascular: Negative.   Genitourinary: Positive for menstrual problem and vaginal bleeding.  All other systems reviewed and are negative.      Objective:   Physical Exam  Constitutional: She is oriented to person, place, and time. She appears well-developed and well-nourished. No distress.  HENT:  Head: Normocephalic.  Eyes: Pupils are equal, round, and reactive to light.  Neck: Normal range of motion. Neck supple. No thyromegaly present.  Cardiovascular: Normal rate, regular rhythm, normal heart sounds and intact distal pulses.   No murmur heard. Pulmonary/Chest: Effort normal and breath sounds normal. No respiratory distress. She has no wheezes.  Abdominal: Soft. Bowel sounds are normal. She exhibits no distension. There is no tenderness.  Neurological: She is alert and oriented to person, place, and time.  Skin: Skin is warm and dry.  Psychiatric: She has a normal mood and affect. Her behavior is normal. Judgment and thought content normal.  Vitals reviewed.    BP 138/79   Pulse 91   Temp 98.1 F (36.7 C) (Oral)   Ht 5\' 2"  (1.575 m)   Wt 142 lb (64.4 kg)   LMP 11/24/2016   BMI 25.97 kg/m       Assessment  & Plan:  1. Menorrhagia with regular cycle - Fingerstick Hemoglobin  2. At risk for bleeding related to anticoagulants - Fingerstick Hemoglobin  Pt currently taking iron BID- continue  Hgb pending Report any other bleeding, light headedness PT requesting pain medication, discussed she will need to follow up with PCP   Evelina Dun, FNP

## 2016-12-21 NOTE — Patient Instructions (Signed)
Menorrhagia Menorrhagia is the medical term for when your menstrual periods are heavy or last longer than usual. With menorrhagia, every period you have may cause enough blood loss and cramping that you are unable to maintain your usual activities. CAUSES  In some cases, the cause of heavy periods is unknown, but a number of conditions may cause menorrhagia. Common causes include:  A problem with the hormone-producing thyroid gland (hypothyroid).  Noncancerous growths in the uterus (polyps or fibroids).  An imbalance of the estrogen and progesterone hormones.  One of your ovaries not releasing an egg during one or more months.  Side effects of having an intrauterine device (IUD).  Side effects of some medicines, such as anti-inflammatory medicines or blood thinners.  A bleeding disorder that stops your blood from clotting normally. SIGNS AND SYMPTOMS  During a normal period, bleeding lasts between 4 and 8 days. Signs that your periods are too heavy include:  You routinely have to change your pad or tampon every 1 or 2 hours because it is completely soaked.  You pass blood clots larger than 1 inch (2.5 cm) in size.  You have bleeding for more than 7 days.  You need to use pads and tampons at the same time because of heavy bleeding.  You need to wake up to change your pads or tampons during the night.  You have symptoms of anemia, such as tiredness, fatigue, or shortness of breath. DIAGNOSIS  Your health care provider will perform a physical exam and ask you questions about your symptoms and menstrual history. Other tests may be ordered based on what the health care provider finds during the exam. These tests can include:  Blood tests. Blood tests are used to check if you are pregnant or have hormonal changes, a bleeding or thyroid disorder, low iron levels (anemia), or other problems.  Endometrial biopsy. Your health care provider takes a sample of tissue from the inside of your  uterus to be examined under a microscope.  Pelvic ultrasound. This test uses sound waves to make a picture of your uterus, ovaries, and vagina. The pictures can show if you have fibroids or other growths.  Hysteroscopy. For this test, your health care provider will use a small telescope to look inside your uterus. Based on the results of your initial tests, your health care provider may recommend further testing. TREATMENT  Treatment may not be needed. If it is needed, your health care provider may recommend treatment with one or more medicines first. If these do not reduce bleeding enough, a surgical treatment might be an option. The best treatment for you will depend on:   Whether you need to prevent pregnancy.  Your desire to have children in the future.  The cause and severity of your bleeding.  Your opinion and personal preference.  Medicines for menorrhagia may include:  Birth control methods that use hormones. These include the pill, skin patch, vaginal ring, shots that you get every 3 months, hormonal IUD, and implant. These treatments reduce bleeding during your menstrual period.  Medicines that thicken blood and slow bleeding.  Medicines that reduce swelling, such as ibuprofen.  Medicines that contain a synthetic hormone called progestin.   Medicines that make the ovaries stop working for a short time.  You may need surgical treatment for menorrhagia if the medicines are unsuccessful. Treatment options include:  Dilation and curettage (D&C). In this procedure, your health care provider opens (dilates) your cervix and then scrapes or suctions tissue from   the lining of your uterus to reduce menstrual bleeding. °· Operative hysteroscopy. This procedure uses a tiny tube with a light (hysteroscope) to view your uterine cavity and can help in the surgical removal of a polyp that may be causing heavy periods. °· Endometrial ablation. Through various techniques, your health care  provider permanently destroys the entire lining of your uterus (endometrium). After endometrial ablation, most women have little or no menstrual flow. Endometrial ablation reduces your ability to become pregnant. °· Endometrial resection. This surgical procedure uses an electrosurgical wire loop to remove the lining of the uterus. This procedure also reduces your ability to become pregnant. °· Hysterectomy. Surgical removal of the uterus and cervix is a permanent procedure that stops menstrual periods. Pregnancy is not possible after a hysterectomy. This procedure requires anesthesia and hospitalization. °HOME CARE INSTRUCTIONS  °· Only take over-the-counter or prescription medicines as directed by your health care provider. Take prescribed medicines exactly as directed. Do not change or switch medicines without consulting your health care provider. °· Take any prescribed iron pills exactly as directed by your health care provider. Long-term heavy bleeding may result in low iron levels. Iron pills help replace the iron your body lost from heavy bleeding. Iron may cause constipation. If this becomes a problem, increase the bran, fruits, and roughage in your diet. °· Do not take aspirin or medicines that contain aspirin 1 week before or during your menstrual period. Aspirin may make the bleeding worse. °· If you need to change your sanitary pad or tampon more than once every 2 hours, stay in bed and rest as much as possible until the bleeding stops. °· Eat well-balanced meals. Eat foods high in iron. Examples are leafy green vegetables, meat, liver, eggs, and whole grain breads and cereals. Do not try to lose weight until the abnormal bleeding has stopped and your blood iron level is back to normal. °SEEK MEDICAL CARE IF:  °· You soak through a pad or tampon every 1 or 2 hours, and this happens every time you have a period. °· You need to use pads and tampons at the same time because you are bleeding so much. °· You  need to change your pad or tampon during the night. °· You have a period that lasts for more than 8 days. °· You pass clots bigger than 1 inch wide. °· You have irregular periods that happen more or less often than once a month. °· You feel dizzy or faint. °· You feel very weak or tired. °· You feel short of breath or feel your heart is beating too fast when you exercise. °· You have nausea and vomiting or diarrhea while you are taking your medicine. °· You have any problems that may be related to the medicine you are taking. °SEEK IMMEDIATE MEDICAL CARE IF:  °· You soak through 4 or more pads or tampons in 2 hours. °· You have any bleeding while you are pregnant. °MAKE SURE YOU:  °· Understand these instructions. °· Will watch your condition. °· Will get help right away if you are not doing well or get worse. °This information is not intended to replace advice given to you by your health care provider. Make sure you discuss any questions you have with your health care provider. °Document Released: 11/04/2005 Document Revised: 02/26/2016 Document Reviewed: 04/25/2013 °Elsevier Interactive Patient Education © 2017 [employer] ° °

## 2016-12-23 ENCOUNTER — Other Ambulatory Visit: Payer: Self-pay

## 2016-12-23 ENCOUNTER — Other Ambulatory Visit: Payer: Self-pay | Admitting: Nurse Practitioner

## 2016-12-23 LAB — FINGERSTICK HEMOGLOBIN: Hemoglobin: 9.9 g/dL — ABNORMAL LOW (ref 11.1–15.9)

## 2016-12-23 MED ORDER — ALPRAZOLAM 1 MG PO TABS: 1.0000 mg | ORAL_TABLET | Freq: Two times a day (BID) | ORAL | 0 refills | Status: DC

## 2016-12-23 MED ORDER — APIXABAN 5 MG PO TABS: 5.0000 mg | ORAL_TABLET | Freq: Two times a day (BID) | ORAL | 0 refills | Status: DC

## 2016-12-23 MED ORDER — LOSARTAN POTASSIUM 25 MG PO TABS: 25.0000 mg | ORAL_TABLET | Freq: Every day | ORAL | 0 refills | Status: DC

## 2016-12-23 NOTE — Addendum Note (Signed)
Addended by: Shelbie Ammons on: 12/23/2016 08:59 AM   Modules accepted: Orders

## 2016-12-23 NOTE — Telephone Encounter (Signed)
Patient was scheduled with dr. Livia Snellen for pain contract.  He had the earlier appointment she was requesting.  Her script for xanax was called in and other requested medications were sent in.

## 2016-12-23 NOTE — Telephone Encounter (Signed)
Please call in xanax with 1 refills 

## 2016-12-23 NOTE — Telephone Encounter (Signed)
cannoit write rx for pain medication without being seen- office policy

## 2016-12-24 ENCOUNTER — Other Ambulatory Visit: Payer: Self-pay | Admitting: *Deleted

## 2016-12-24 ENCOUNTER — Ambulatory Visit: Payer: BLUE CROSS/BLUE SHIELD | Admitting: Family Medicine

## 2016-12-24 ENCOUNTER — Encounter: Payer: Self-pay | Admitting: Nurse Practitioner

## 2016-12-24 ENCOUNTER — Other Ambulatory Visit: Payer: Self-pay

## 2016-12-24 ENCOUNTER — Ambulatory Visit (HOSPITAL_COMMUNITY)
Admission: RE | Admit: 2016-12-24 | Discharge: 2016-12-24 | Disposition: A | Discharge status: Home / Self Care | Payer: BLUE CROSS/BLUE SHIELD | Source: Ambulatory Visit | Attending: Nurse Practitioner | Admitting: Nurse Practitioner

## 2016-12-24 ENCOUNTER — Ambulatory Visit (INDEPENDENT_AMBULATORY_CARE_PROVIDER_SITE_OTHER): Payer: BLUE CROSS/BLUE SHIELD | Admitting: Nurse Practitioner

## 2016-12-24 VITALS — BP 128/75 | HR 107 | Temp 97.6°F | Ht 62.0 in | Wt 143.8 lb

## 2016-12-24 DIAGNOSIS — M79604 Pain in right leg: Secondary | ICD-10-CM

## 2016-12-24 DIAGNOSIS — M25561 Pain in right knee: Secondary | ICD-10-CM | POA: Diagnosis not present

## 2016-12-24 DIAGNOSIS — F411 Generalized anxiety disorder: Secondary | ICD-10-CM

## 2016-12-24 MED ORDER — HYDROCODONE-ACETAMINOPHEN 5-325 MG PO TABS: 1.0000 | ORAL_TABLET | Freq: Two times a day (BID) | ORAL | 0 refills | Status: DC | PRN

## 2016-12-24 MED ORDER — CITALOPRAM HYDROBROMIDE 40 MG PO TABS: 40.0000 mg | ORAL_TABLET | Freq: Every day | ORAL | 5 refills | Status: DC

## 2016-12-24 NOTE — Telephone Encounter (Signed)
Was done by other means

## 2016-12-24 NOTE — Addendum Note (Signed)
Addended by: Chevis Pretty on: 12/24/2016 04:05 PM   Modules accepted: Orders

## 2016-12-24 NOTE — Progress Notes (Signed)
   Subjective:    Patient ID: Jennifer Mullins, female    DOB: 24-Dec-1970, 46 y.o.   MRN: [id number]  HPI Patient comes in today with several complaints: - anxiety- when she was in hospital with blood clot in her lung the hospitalist increased her xanax from .05mg  BID to 1mg  BID. She is not on any other anxiety medication and has never tried an ssri for her anxiety. - swelling on inner post aspect of right leg with pain on palapation- afraid she has another blood clot. - Pain meds- she has [facility] at end of december and she is still having pain. Her surgeon is no longer giving her pain medication. SHe says she still needs at least BID right now. Painis still 6/10 at times in neck.    Review of Systems  Constitutional: Negative.   HENT: Negative.   Respiratory: Negative.   Cardiovascular: Negative.   Gastrointestinal: Negative.   Musculoskeletal: Positive for joint swelling (right medial post knee) and neck pain.  Neurological: Negative.   Psychiatric/Behavioral: Negative.   All other systems reviewed and are negative.      Objective:   Physical Exam  Constitutional: She is oriented to person, place, and time. She appears well-developed and well-nourished. No distress.  Cardiovascular: Normal rate, regular rhythm, normal heart sounds and intact distal pulses.   Pulmonary/Chest: Effort normal and breath sounds normal.  Musculoskeletal:  Mild medial posterior knee edema with tenderness to touch.  Neurological: She is alert and oriented to person, place, and time.  Skin: Skin is warm.  Psychiatric: She has a normal mood and affect. Her behavior is normal. Judgment and thought content normal.   BP 128/75   Pulse (!) 107   Temp 97.6 F (36.4 C) (Oral)   Ht 5\' 2"  (1.575 m)   Wt 143 lb 12.8 oz (65.2 kg)   LMP 11/24/2016   BMI 26.30 kg/m     Assessment & Plan:  1. Posterior knee pain, right CANNOT stay on pain medication with xanax- gave enough pain meds to wean off of in the next 2  weeks - VAS [place] LOWER EXTREMITY VENOUS (DVT); Future - HYDROcodone-acetaminophen (NORCO) 5-325 MG tablet; Take 1 tablet by mouth 2 (two) times daily as needed for moderate pain.  Dispense: 30 tablet; Refill: 0  2. GAD (generalized anxiety disorder) Stress management Added celexa to med today - citalopram (CELEXA) 40 MG tablet; Take 1 tablet (40 mg total) by mouth daily.  Dispense: 30 tablet; Refill: [place], FNP

## 2016-12-25 ENCOUNTER — Telehealth: Payer: Self-pay | Admitting: Nurse Practitioner

## 2016-12-25 NOTE — Telephone Encounter (Signed)
Do you know anything about this? Please send back to pools.

## 2016-12-26 ENCOUNTER — Other Ambulatory Visit: Payer: Self-pay | Admitting: Nurse Practitioner

## 2016-12-26 ENCOUNTER — Telehealth: Payer: Self-pay

## 2016-12-26 ENCOUNTER — Telehealth: Payer: Self-pay | Admitting: *Deleted

## 2016-12-26 MED ORDER — EFLORNITHINE HCL 13.9 % EX CREA: TOPICAL_CREAM | CUTANEOUS | 5 refills | Status: DC

## 2016-12-26 NOTE — Telephone Encounter (Signed)
Call to pt regarding changing providers Explained to pt, in depth,. that our office policy regarding controlled medications, pts can not switch providers once established with provider . Pt was not happy regarding this and wanted to speak with clinical manager.

## 2016-12-26 NOTE — Telephone Encounter (Signed)
Spoke with patient for 27 minutes.  She wants to change providers to a MD. She feels that her medical history is to  complicated and she needs a MD. We discussed her medical and medication history in great detail. She wants a female MD. Appt made with Dr Evette Doffing 01/01/16 @ 2:30. I explained to patient our office policy regarding controlled substances. I explained that our providers did not normally prescribe Xanax 1mg  and patients were normally referred out to mental health for this strength of Xanax and she voices understanding. I also explained that she would not be able to switch back and forth with providers within our office if she did not get what she wanted Patient voices understanding.Marland Kitchen

## 2016-12-26 NOTE — Telephone Encounter (Signed)
I dont even see this med on her list?

## 2016-12-27 NOTE — Telephone Encounter (Signed)
Please let patient know insurance will not cover meds

## 2016-12-28 ENCOUNTER — Other Ambulatory Visit: Payer: Self-pay | Admitting: Family

## 2016-12-28 ENCOUNTER — Other Ambulatory Visit: Payer: Self-pay | Admitting: Nurse Practitioner

## 2016-12-28 DIAGNOSIS — J4522 Mild intermittent asthma with status asthmaticus: Secondary | ICD-10-CM

## 2016-12-28 DIAGNOSIS — J069 Acute upper respiratory infection, unspecified: Secondary | ICD-10-CM

## 2016-12-30 ENCOUNTER — Ambulatory Visit: Payer: BLUE CROSS/BLUE SHIELD | Admitting: Physician Assistant

## 2016-12-30 ENCOUNTER — Ambulatory Visit (INDEPENDENT_AMBULATORY_CARE_PROVIDER_SITE_OTHER): Payer: BLUE CROSS/BLUE SHIELD | Admitting: "Endocrinology

## 2016-12-30 ENCOUNTER — Encounter: Payer: Self-pay | Admitting: "Endocrinology

## 2016-12-30 DIAGNOSIS — E042 Nontoxic multinodular goiter: Secondary | ICD-10-CM | POA: Diagnosis not present

## 2016-12-30 DIAGNOSIS — I10 Essential (primary) hypertension: Secondary | ICD-10-CM | POA: Diagnosis not present

## 2016-12-30 DIAGNOSIS — R7303 Prediabetes: Secondary | ICD-10-CM | POA: Diagnosis not present
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
## 2016-12-31 ENCOUNTER — Encounter: Payer: Self-pay | Admitting: Pediatrics

## 2016-12-31 ENCOUNTER — Ambulatory Visit (INDEPENDENT_AMBULATORY_CARE_PROVIDER_SITE_OTHER): Payer: BLUE CROSS/BLUE SHIELD | Admitting: Pediatrics

## 2016-12-31 VITALS — BP 134/84 | HR 104 | Temp 98.5°F | Ht 62.0 in | Wt 144.4 lb

## 2016-12-31 DIAGNOSIS — R059 Cough, unspecified: Secondary | ICD-10-CM

## 2016-12-31 DIAGNOSIS — R05 Cough: Secondary | ICD-10-CM

## 2016-12-31 DIAGNOSIS — F411 Generalized anxiety disorder: Secondary | ICD-10-CM

## 2016-12-31 DIAGNOSIS — J019 Acute sinusitis, unspecified: Secondary | ICD-10-CM | POA: Diagnosis not present

## 2016-12-31 MED ORDER — CITALOPRAM HYDROBROMIDE 20 MG PO TABS: 20.0000 mg | ORAL_TABLET | Freq: Every day | ORAL | 1 refills | Status: DC

## 2016-12-31 MED ORDER — AMOXICILLIN-POT CLAVULANATE 875-125 MG PO TABS: 1.0000 | ORAL_TABLET | Freq: Two times a day (BID) | ORAL | 0 refills | Status: DC

## 2016-12-31 MED ORDER — HYDROCOD POLST-CPM POLST ER 10-8 MG/5ML PO SUER: 5.0000 mL | Freq: Two times a day (BID) | ORAL | 0 refills | Status: DC

## 2016-12-31 NOTE — Patient Instructions (Signed)
Take half a 20 mg tab (10mg ) for 8 days. Then increase to full tab (20mg ) If still with anxiety symptoms around March 8, call me, we will increase you back to 40mg 

## 2016-12-31 NOTE — Progress Notes (Signed)
  Subjective:   Patient ID: Jennifer Mullins, female    DOB: October 22, 1971, 46 y.o.   MRN: [id number] CC: Sinusitis; Hoarse (since neck issues); and Cough (still not able to lay flat )  HPI: Jennifer Mullins is a 46 y.o. female presenting for Sinusitis; Hoarse (since neck issues); and Cough (still not able to lay flat )  Past 2 weeks increasing facial pressure Has been clear to yellow discharge Lots of pain in face Taking mucinex Surgery 3 years ago on sinuses Arms and legs aching  Anxiety: Started on celexa a week ago Says she feels ramped up, off, not herself so stopped two days ago Ongoing anxiety  Discharged from hospital 1 month ago for acute PE Was discharged with order for oxygen, says she was on oxygen throughout hospital stay, All O2 sats visible are over 95% in epic though hospital nursing notes are not visible  Relevant past medical, surgical, family and social history reviewed. Allergies and medications reviewed and updated. History  Smoking Status  . Former Smoker  . Packs/day: 0.25  . Years: 5.00  . Types: Cigarettes  . Quit date: 11/18/1989  Smokeless Tobacco  . Never Used   ROS: Per HPI   Objective:    BP 134/84   Pulse (!) 104   Temp 98.5 F (36.9 C) (Oral)   Ht 5\' 2"  (1.575 m)   Wt 144 lb 6.4 oz (65.5 kg)   BMI 26.41 kg/m   Wt Readings from Last 3 Encounters:  12/31/16 144 lb 6.4 oz (65.5 kg)  12/24/16 143 lb 12.8 oz (65.2 kg)  12/21/16 142 lb (64.4 kg)    Gen: NAD, alert, cooperative with exam, NCAT, in cervical collar EYES: EOMI, no conjunctival injection, or no icterus ENT:  TMs dull gray b/l, OP without erythema, mildly tender over max sinuses b/l CV: NRRR, normal S1/S2, no murmur, distal pulses 2+ b/l Resp: CTABL, no wheezes, normal WOB Abd: +BS, soft, NTND.  Ext: No edema, warm Neuro: Alert and oriented  Assessment & Plan:  Jennifer Mullins was seen today for sinusitis, hoarse and cough.  Diagnoses and all orders for this visit:  Acute sinusitis,  recurrence not specified, unspecified location Sinus rinses, flonase, ipratropium If ongoing symptosm will get back in to see ENT -     amoxicillin-clavulanate (AUGMENTIN) 875-125 MG tablet; Take 1 tablet by mouth 2 (two) times daily.  GAD (generalized anxiety disorder) -     citalopram (CELEXA) 20 MG tablet; Take 1 tablet (20 mg total) by mouth daily.  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

## 2017-01-01 ENCOUNTER — Other Ambulatory Visit: Payer: Self-pay | Admitting: Pediatrics

## 2017-01-01 NOTE — Telephone Encounter (Signed)
[name] started this - looks like she may have completed it  [place] coverage

## 2017-01-03 ENCOUNTER — Other Ambulatory Visit: Payer: Self-pay

## 2017-01-03 MED ORDER — IPRATROPIUM BROMIDE 0.03 % NA SOLN: 2.0000 | Freq: Two times a day (BID) | NASAL | 5 refills | Status: DC

## 2017-01-04 ENCOUNTER — Encounter: Payer: Self-pay | Admitting: Nurse Practitioner

## 2017-01-06 ENCOUNTER — Telehealth: Payer: Self-pay | Admitting: Nurse Practitioner

## 2017-01-06 NOTE — Telephone Encounter (Signed)
Pt questioning disability paperwork Please advise

## 2017-01-06 NOTE — Telephone Encounter (Signed)
mandy talked to pt

## 2017-01-07 ENCOUNTER — Telehealth: Payer: Self-pay

## 2017-01-07 ENCOUNTER — Encounter: Payer: Self-pay | Admitting: Allergy and Immunology

## 2017-01-07 ENCOUNTER — Ambulatory Visit (INDEPENDENT_AMBULATORY_CARE_PROVIDER_SITE_OTHER): Payer: BLUE CROSS/BLUE SHIELD | Admitting: Allergy and Immunology

## 2017-01-07 VITALS — BP 120/70 | HR 100 | Resp 18 | Ht 62.6 in | Wt 140.8 lb

## 2017-01-07 DIAGNOSIS — J324 Chronic pansinusitis: Secondary | ICD-10-CM | POA: Diagnosis not present

## 2017-01-07 DIAGNOSIS — H1010 Acute atopic conjunctivitis, unspecified eye: Secondary | ICD-10-CM | POA: Diagnosis not present

## 2017-01-07 DIAGNOSIS — J019 Acute sinusitis, unspecified: Secondary | ICD-10-CM

## 2017-01-07 DIAGNOSIS — H6982 Other specified disorders of Eustachian tube, left ear: Secondary | ICD-10-CM | POA: Diagnosis not present

## 2017-01-07 DIAGNOSIS — J309 Allergic rhinitis, unspecified: Secondary | ICD-10-CM

## 2017-01-07 DIAGNOSIS — J4540 Moderate persistent asthma, uncomplicated: Secondary | ICD-10-CM | POA: Diagnosis not present

## 2017-01-07 DIAGNOSIS — K219 Gastro-esophageal reflux disease without esophagitis: Secondary | ICD-10-CM | POA: Diagnosis not present

## 2017-01-07 DIAGNOSIS — R519 Headache, unspecified: Secondary | ICD-10-CM

## 2017-01-07 DIAGNOSIS — R51 Headache: Secondary | ICD-10-CM | POA: Diagnosis not present

## 2017-01-07 MED ORDER — CYPROHEPTADINE HCL 4 MG PO TABS: 4.0000 mg | ORAL_TABLET | Freq: Every day | ORAL | 5 refills | Status: DC

## 2017-01-07 MED ORDER — AMOXICILLIN-POT CLAVULANATE 875-125 MG PO TABS: 1.0000 | ORAL_TABLET | Freq: Two times a day (BID) | ORAL | 0 refills | Status: DC

## 2017-01-07 NOTE — Telephone Encounter (Signed)
Patient wants to know if she should continue using her generic Patanase nose spray along with the Flonase? Also, patient requests Diflucan to take with the antibiotic. Please advise.

## 2017-01-07 NOTE — Telephone Encounter (Signed)
Patient informed. 

## 2017-01-07 NOTE — Patient Instructions (Addendum)
  1. Treat infection:   A. use a full 3 weeks of Augmentin 875 one tablet twice a day  2. Treat and prevent inflammation:   A. prednisone 10 mg tablet 1 time per day for 10 days  B. continue Symbicort 160 - 2 inhalations twice a day  C. continue Flonase - one spray each nostril twice a day  D. continue montelukast - one tablet one time per day  3. Treat and prevent reflux:   A. continue pantoprazole 40 mg in a.m.  B. continue famotidine 20 mg in PM  4. Treat and prevent headaches:   A. continue cyproheptadine 4 mg tablet at bedtime  5. If needed:   A. nasal saline  B. OTC antihistamine  C. Proventil HFA or similar 2 inhalations every 4-6 hours  6. Obtain sinus CT scan at end of Augmentin therapy  7. Consider a course of immunotherapy  8. Return to clinic in 4 weeks or earlier if problem

## 2017-01-07 NOTE — Progress Notes (Signed)
Follow-up Note  Referring Provider: Chevis Pretty, * Primary Provider: Chevis Pretty, FNP Date of Office Visit: 01/07/2017  Subjective:   Jennifer Mullins (DOB: 08-Sep-1971) is a 46 y.o. female who returns to the [facility] on 01/07/2017 in re-evaluation of the following:  HPI: [name] presents to this clinic in evaluation of her asthma and allergic rhinitis and chronic sinusitis requiring FESS, and chronic cephalgia and reflux. I've not seen her in his clinic since 2015. She did have a visit with Dr. Ishmael Holter in the summer of 2016.  When I last saw her in this clinic she was deemed to be extremely atopic with hypersensitivity against grasses and weeds and trees and house dust mite and cat and dog and horse and molds. She still continues to have problems with her respiratory tract throughout the year. She has recurrent problems with nasal congestion and sneezing and occasionally some issues with asthma although it does not sound as though she has required a systemic steroid to treat an exacerbation in over a year. Her requirement for short acting bronchodilator is about 1 time per week as long as she remains on Symbicort on a consistent basis.  She has had about 3-4 sinus infections per year while using her Flonase. She had a sinus infection in December and over the course of the past 2 weeks she has ear fullness and popping especially on her left ear and facial ache and forehead headache and scratchy throat and sneezing and coughing and ugly nasal discharge. Fortunately, her ugly nasal discharge cleared with the administration of Augmentin by her primary care doctor.  Her reflux is under very good control as long she consistently use her pantoprazole and Pepcid. She does need both of these agents to maintain good control of her reflux. She does need both these agents or she has regurgitation up into her throat.  She believes that cyproheptadine has helped her  chronic cephalgia and it also appears to help some of her sleep dysfunction.  Most recently, she had neck surgery at the tail end of December and 6 days later developed a pulmonary embolus. Since that point in time she's had some issues with shortness of breath. Apparently she had exercise oximetry performed which identified adequate oxygenation. She is now on eliquis.  Allergies as of 01/07/2017      Reactions   Demerol [meperidine] Shortness Of Breath   FLUSHING AND SHORTNESS OF BREATH   Morphine And Related Shortness Of Breath   Flushed and hot hyper   Diltiazem Hcl Hives      Medication List      acetaminophen 325 MG tablet Commonly known as:  TYLENOL Take 2 tablets (650 mg total) by mouth every 6 (six) hours as needed for mild pain (or Fever >/= 101).   albuterol 108 (90 Base) MCG/ACT inhaler Commonly known as:  PROVENTIL HFA;VENTOLIN HFA Inhale 1-2 puffs into the lungs every 4 (four) hours as needed for wheezing or shortness of breath. Rescue inhaler   ALPRAZolam 1 MG tablet Commonly known as:  XANAX Take 1 tablet (1 mg total) by mouth 2 (two) times daily.   amoxicillin-clavulanate 875-125 MG tablet Commonly known as:  AUGMENTIN Take 1 tablet by mouth 2 (two) times daily.   apixaban 5 MG Tabs tablet Commonly known as:  ELIQUIS Take 1 tablet (5 mg total) by mouth 2 (two) times daily.   benzonatate 200 MG capsule Commonly known as:  TESSALON Take 1 capsule (200 mg  total) by mouth 3 (three) times daily as needed for cough.   carboxymethylcellulose 0.5 % Soln Commonly known as:  REFRESH PLUS Place 1 drop into both eyes 3 (three) times daily as needed (dryness).   chlorpheniramine-HYDROcodone 10-8 MG/5ML Suer Commonly known as:  TUSSIONEX Take 5 mLs by mouth every 12 (twelve) hours.   citalopram 20 MG tablet Commonly known as:  CELEXA Take 1 tablet (20 mg total) by mouth daily.   cyproheptadine 4 MG tablet Commonly known as:  PERIACTIN TAKE 1 TABLET BY MOUTH AT  BEDTIME   famotidine 20 MG tablet Commonly known as:  PEPCID Take 1 tablet (20 mg total) by mouth at bedtime.   ferrous sulfate 325 (65 FE) MG tablet Take 1 tablet (325 mg total) by mouth 2 (two) times daily with a meal.   fluconazole 150 MG tablet Commonly known as:  DIFLUCAN TAKE 1 TABLET BY MOUTH EVERY WEEK FOR 4 WEEKS   fluticasone 50 MCG/ACT nasal spray Commonly known as:  FLONASE Place 2 sprays into both nostrils daily.   furosemide 20 MG tablet Commonly known as:  LASIX Take 1 tablet (20 mg total) by mouth daily.   HYDROcodone-acetaminophen 5-325 MG tablet Commonly known as:  NORCO Take 1 tablet by mouth 2 (two) times daily as needed for moderate pain.   hydrOXYzine 50 MG tablet Commonly known as:  ATARAX/VISTARIL Take 1 tablet (50 mg total) by mouth 3 (three) times daily as needed for itching.   ipratropium 0.03 % nasal spray Commonly known as:  ATROVENT Place 2 sprays into both nostrils every 12 (twelve) hours.   latanoprost 0.005 % ophthalmic solution Commonly known as:  XALATAN   losartan 25 MG tablet Commonly known as:  COZAAR Take 1 tablet (25 mg total) by mouth daily.   metFORMIN 500 MG tablet Commonly known as:  GLUCOPHAGE Take 1 tablet (500 mg total) by mouth 2 (two) times daily with a meal.   montelukast 10 MG tablet Commonly known as:  SINGULAIR TAKE 1 TABLET BY MOUTH AT BEDTIME   Olopatadine HCl 0.6 % Soln Place 1 drop (1 puff total) into the nose every morning.   pantoprazole 40 MG tablet Commonly known as:  PROTONIX Take 1 tablet (40 mg total) by mouth daily.   polyethylene glycol packet Commonly known as:  MIRALAX / GLYCOLAX Take 17 g by mouth daily as needed for mild constipation.   pravastatin 40 MG tablet Commonly known as:  PRAVACHOL TAKE 1 TABLET BY MOUTH EVERY DAY   SYMBICORT 160-4.5 MCG/ACT inhaler Generic drug:  budesonide-formoterol INHALE 2 PUFFS BY MOUTH FIRST THING IN THE MORNING AND THEN 2 PUFFS BY MOUTH 12 HOURS  LATER       Past Medical History:  Diagnosis Date  . Anemia   . Anxiety   . Arthritis   . Asthma   . Bilateral calcaneal spurs   . Bilateral polycystic ovarian syndrome   . Bulging lumbar disc   . Carpal tunnel syndrome, bilateral   . COPD (chronic obstructive pulmonary disease) (HCC)    BRONCHITIS  . Diabetes mellitus without complication ([place])    TYPE 2   DX  4-5 YRS AGO  . Fibroids    uterine  . GAD (generalized anxiety disorder)   . GERD (gastroesophageal reflux disease)    TAKES PRESCRIPTION MEDS  . Glaucoma    BOTH EYES  . Hyperlipidemia   . Hypertension   . Hypothyroidism    NODULES ON THYROID  . PONV (postoperative nausea  and vomiting)   . Pulmonary embolism ([place])   . Tachycardia     Past Surgical History:  Procedure Laterality Date  . ANTERIOR CERVICAL DECOMP/DISCECTOMY FUSION N/A 11/13/2016   Procedure: Cervical five-six, Cervical six-seven Anterior Cervical Discectomy and Fusion, Allograft, Plate;  Surgeon: Marybelle Killings, MD;  Location: [facility];  Service: Orthopedics;  Laterality: N/A;  . BREAST SURGERY    . CARPAL TUNNEL RELEASE    . DILATION AND CURETTAGE OF UTERUS    . EXPLORATORY LAPAROTOMY    . NASAL SINUS SURGERY  07/22/13   Dr. Redmond Pulling in [place]    Review of systems negative except as noted in HPI / PMHx or noted below:  Review of Systems  Constitutional: Negative.   HENT: Negative.   Eyes: Negative.   Respiratory: Negative.   Cardiovascular: Negative.   Gastrointestinal: Negative.   Genitourinary: Negative.   Musculoskeletal: Negative.   Skin: Negative.   Neurological: Negative.   Endo/Heme/Allergies: Negative.   Psychiatric/Behavioral: Negative.      Objective:   Vitals:   01/07/17 1132  BP: 120/70  Pulse: 100  Resp: 18   Height: 5' 2.6" (159 cm)  Weight: 140 lb 12.8 oz (63.9 kg)   Physical Exam  Constitutional: She is well-developed, well-nourished, and in no distress.  Neck collar  HENT:  Head: Normocephalic.  Right Ear:  Tympanic membrane, external ear and ear canal normal.  Left Ear: External ear and ear canal normal. A middle ear effusion (absence of pneumatic movement) is present.  Nose: Nose normal. No mucosal edema or rhinorrhea.  Mouth/Throat: Uvula is midline, oropharynx is clear and moist and mucous membranes are normal. No oropharyngeal exudate.  Eyes: Conjunctivae are normal.  Neck: Trachea normal. No tracheal tenderness present. No tracheal deviation present. No thyromegaly present.  Cardiovascular: Normal rate, regular rhythm, S1 normal, S2 normal and normal heart sounds.   No murmur heard. Pulmonary/Chest: Breath sounds normal. No stridor. No respiratory distress. She has no wheezes. She has no rales.  Musculoskeletal: She exhibits no edema.  Lymphadenopathy:       Head (right side): No tonsillar adenopathy present.       Head (left side): No tonsillar adenopathy present.    She has no cervical adenopathy.  Neurological: She is alert. Gait normal.  Skin: No rash noted. She is not diaphoretic. No erythema. Nails show no clubbing.  Psychiatric: Mood and affect normal.    Diagnostics: oxygen saturation on room air at rest was 98%.   Spirometry was performed and demonstrated an FEV1 of 0.62 at 28 % of predicted.she had a very difficult time performing this maneuver because of her neck collar.  Assessment and Plan:   1. Asthma, moderate persistent, well-controlled   2. Allergic rhinoconjunctivitis   3. Chronic pansinusitis   4. Headache disorder   5. LPRD (laryngopharyngeal reflux disease)   6. ETD (Eustachian tube dysfunction), left   7. Acute sinusitis, recurrence not specified, unspecified location     1. Treat infection:   A. use a full 3 weeks of Augmentin 875 one tablet twice a day  2. Treat and prevent inflammation:   A. prednisone 10 mg tablet 1 time per day for 10 days  B. continue Symbicort 160 - 2 inhalations twice a day  C. continue Flonase - one spray each nostril twice  a day  D. continue montelukast - one tablet one time per day  3. Treat and prevent reflux:   A. continue pantoprazole 40 mg in a.m.  B. continue famotidine 20 mg in PM  4. Treat and prevent headaches:   A. continue cyproheptadine 4 mg tablet at bedtime  5. If needed:   A. nasal saline  B. OTC antihistamine  C. Proventil HFA or similar 2 inhalations every 4-6 hours  6. Obtain sinus CT scan at end of Augmentin therapy  7. Consider a course of immunotherapy  8. Return to clinic in 4 weeks or earlier if problem  Zerah appears to have a persistent sinus infection which apparently is not an uncommon issue given her status of requiring Fess to treat a previous sinus issue. I'm going to treat her with a full 21 days of antibiotics and at the end of 21 days I'll obtain a sinus CT scan to evaluate her sinus cavities. We may need to go ahead and perform a head CT scan instead of the sinus CT scan as I'm not really sure that she'll be able to manipulate her neck for the positioning that is required for sinus CT scans. She will continue to use anti-inflammatory medications and I've given her some systemic steroids to help with her respiratory tract inflammation and her ETD. She will continue to treat reflux as well. I think she should consider starting a course of immunotherapy given the fact that she has rather significant multiorgan atopic disease that is just not responding well to medical treatment. I've given her some literature on this form of therapy during today's visit.  Allena Katz, MD Allergy / Immunology [place]

## 2017-01-07 NOTE — Telephone Encounter (Signed)
Does not need Diflucan as this will not affect bacteria and will not precipitate thrush. She is welcome to use Patanase as needed but she definitely needs to use Flonase consistently.

## 2017-01-08 ENCOUNTER — Other Ambulatory Visit: Payer: Self-pay | Admitting: Allergy and Immunology

## 2017-01-08 ENCOUNTER — Ambulatory Visit (INDEPENDENT_AMBULATORY_CARE_PROVIDER_SITE_OTHER): Payer: BLUE CROSS/BLUE SHIELD | Admitting: Orthopaedic Surgery

## 2017-01-08 ENCOUNTER — Telehealth: Payer: Self-pay | Admitting: Allergy and Immunology

## 2017-01-08 DIAGNOSIS — J329 Chronic sinusitis, unspecified: Secondary | ICD-10-CM

## 2017-01-08 MED ORDER — OLOPATADINE HCL 0.6 % NA SOLN: 1.0000 | Freq: Every morning | NASAL | 3 refills | Status: DC

## 2017-01-08 NOTE — Telephone Encounter (Signed)
Patient was seen by KOZLOW yesterday Patient needs refill on olopatadine called into [facility] Please call [phone] if there are any questions 1 -2 sprays daily for congestion

## 2017-01-08 NOTE — Telephone Encounter (Signed)
Refill sent in to pharmacy 

## 2017-01-09 ENCOUNTER — Telehealth: Payer: Self-pay | Admitting: Nurse Practitioner

## 2017-01-10 ENCOUNTER — Telehealth: Payer: Self-pay | Admitting: Nurse Practitioner

## 2017-01-10 NOTE — Telephone Encounter (Signed)
Jennifer Mullins came by office today to speak with [place] about her paperwork.  I had Jake Michaelis and myself talk with her.  Spoke with attorney and let them know that we could not fill out the [place] paperwork.  This needs to be done by a Disability specialist.  Atty was aware of this, but hoped we could add something to it.  Explained we could not.   Patient also was told that she would need to stay with Dr. Evette Doffing to follow up since she had switched to her from [facility].  Elainea Terpening will not see patient anymore.

## 2017-01-11 ENCOUNTER — Emergency Department (HOSPITAL_COMMUNITY)
Admission: EM | Admit: 2017-01-11 | Discharge: 2017-01-11 | Disposition: A | Discharge status: Home / Self Care | Payer: BLUE CROSS/BLUE SHIELD | Attending: Emergency Medicine | Admitting: Emergency Medicine

## 2017-01-11 ENCOUNTER — Encounter (HOSPITAL_COMMUNITY): Payer: Self-pay | Admitting: Emergency Medicine

## 2017-01-11 ENCOUNTER — Emergency Department (HOSPITAL_COMMUNITY): Payer: BLUE CROSS/BLUE SHIELD

## 2017-01-11 DIAGNOSIS — Y9389 Activity, other specified: Secondary | ICD-10-CM | POA: Insufficient documentation

## 2017-01-11 DIAGNOSIS — E119 Type 2 diabetes mellitus without complications: Secondary | ICD-10-CM | POA: Diagnosis not present

## 2017-01-11 DIAGNOSIS — Z7984 Long term (current) use of oral hypoglycemic drugs: Secondary | ICD-10-CM | POA: Diagnosis not present

## 2017-01-11 DIAGNOSIS — X501XXA Overexertion from prolonged static or awkward postures, initial encounter: Secondary | ICD-10-CM | POA: Diagnosis not present

## 2017-01-11 DIAGNOSIS — Z79899 Other long term (current) drug therapy: Secondary | ICD-10-CM | POA: Diagnosis not present

## 2017-01-11 DIAGNOSIS — E039 Hypothyroidism, unspecified: Secondary | ICD-10-CM | POA: Insufficient documentation

## 2017-01-11 DIAGNOSIS — S60222A Contusion of left hand, initial encounter: Secondary | ICD-10-CM | POA: Diagnosis not present

## 2017-01-11 DIAGNOSIS — Z87891 Personal history of nicotine dependence: Secondary | ICD-10-CM | POA: Diagnosis not present

## 2017-01-11 DIAGNOSIS — I10 Essential (primary) hypertension: Secondary | ICD-10-CM | POA: Insufficient documentation

## 2017-01-11 DIAGNOSIS — Y929 Unspecified place or not applicable: Secondary | ICD-10-CM | POA: Diagnosis not present

## 2017-01-11 DIAGNOSIS — J449 Chronic obstructive pulmonary disease, unspecified: Secondary | ICD-10-CM | POA: Insufficient documentation

## 2017-01-11 DIAGNOSIS — J45909 Unspecified asthma, uncomplicated: Secondary | ICD-10-CM | POA: Insufficient documentation

## 2017-01-11 DIAGNOSIS — Y999 Unspecified external cause status: Secondary | ICD-10-CM | POA: Insufficient documentation

## 2017-01-11 DIAGNOSIS — S6992XA Unspecified injury of left wrist, hand and finger(s), initial encounter: Secondary | ICD-10-CM | POA: Diagnosis present

## 2017-01-11 MED ORDER — HYDROCODONE-ACETAMINOPHEN 5-325 MG PO TABS
1.0000 | ORAL_TABLET | Freq: Once | ORAL | Status: AC
  Administered 2017-01-11: 1 via ORAL
  Filled 2017-01-11: qty 1

## 2017-01-11 MED ORDER — HYDROCODONE-ACETAMINOPHEN 5-325 MG PO TABS: 1.0000 | ORAL_TABLET | ORAL | 0 refills | Status: DC | PRN

## 2017-01-11 NOTE — ED Triage Notes (Signed)
Pt reports opening screen door today and felt a pop in her hand. Pt has a small fluid fill sac on top of her hand.

## 2017-01-15 ENCOUNTER — Ambulatory Visit: Payer: BLUE CROSS/BLUE SHIELD | Admitting: Pediatrics

## 2017-01-15 NOTE — ED Provider Notes (Signed)
[facility] DEPT Provider Note   CSN: [id number] Arrival date & time: 01/11/17  1253     History   Chief Complaint Chief Complaint  Patient presents with  . Hand Pain    HPI Jennifer Mullins is a 46 y.o. female reporting pain and swelling of her left dorsal hand which occurred when she simply extended her wrist to push her screen door open today.  She had an IV in this hand last month during a hospital admission for a PE, post surgical complication from a cervical diskectomy/fusion surgery the end of December.  She reports the vein "blew" during that admission and this is now the second time the hand has swelled back up again with simple activity.  The swelling has improved greatly since arriving here despite no treatments prior to arrival.  She is concerned there is permanent damage to this vessel.  There is no numbness distal to the swelling site, she denies radiation of pain.  Other pertinent negative include no fevers, shortness of breath, cough, redness or red streaking.  The history is provided by the patient.    Past Medical History:  Diagnosis Date  . Anemia   . Anxiety   . Arthritis   . Asthma   . Bilateral calcaneal spurs   . Bilateral polycystic ovarian syndrome   . Bulging lumbar disc   . Carpal tunnel syndrome, bilateral   . COPD (chronic obstructive pulmonary disease) (HCC)    BRONCHITIS  . Diabetes mellitus without complication ([place])    TYPE 2   DX  4-5 YRS AGO  . Fibroids    uterine  . GAD (generalized anxiety disorder)   . GERD (gastroesophageal reflux disease)    TAKES PRESCRIPTION MEDS  . Glaucoma    BOTH EYES  . Hyperlipidemia   . Hypertension   . Hypothyroidism    NODULES ON THYROID  . PONV (postoperative nausea and vomiting)   . Pulmonary embolism ([place])   . Tachycardia     Patient Active Problem List   Diagnosis Date Noted  . Pulmonary embolism ([place]) 11/24/2016  . Dysfunctional uterine bleeding 11/24/2016  . Anticoagulated 11/24/2016  .  Microcytic anemia 11/24/2016  . Pulmonary embolus ([place])   . Tachycardia   . Vaginal bleeding   . Pulmonary emboli ([place]) 11/19/2016  . Spondylosis without myelopathy or radiculopathy, cervical region 11/13/2016  . Allergic rhinoconjunctivitis 07/28/2015  . Chronic headache 07/28/2015  . PCOS (polycystic ovarian syndrome) 06/20/2015  . 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 Hyperlipidemia LDL goal <100 06/20/2015  . GAD (generalized anxiety disorder) 05/18/2014  . GERD (gastroesophageal reflux disease) 05/18/2014  . Body fluid retention 07/07/2013  . Chronic sinusitis 07/02/2013  . Extrinsic asthma 05/20/2013  . HTN (hypertension) 05/20/2013  . Prediabetes 05/20/2013    Past Surgical History:  Procedure Laterality Date  . ANTERIOR CERVICAL DECOMP/DISCECTOMY FUSION N/A 11/13/2016   Procedure: Cervical five-six, Cervical six-seven Anterior Cervical Discectomy and Fusion, Allograft, Plate;  Surgeon: Marybelle Killings, MD;  Location: [facility];  Service: Orthopedics;  Laterality: N/A;  . BREAST SURGERY    . CARPAL TUNNEL RELEASE    . DILATION AND CURETTAGE OF UTERUS    . EXPLORATORY LAPAROTOMY    . NASAL SINUS SURGERY  07/22/13   Dr. Redmond Pulling in [place] History    No data available       Home Medications    Prior to Admission medications   Medication Sig Start Date End Date Taking? Authorizing Provider  acetaminophen (TYLENOL) 325 MG tablet Take 2 tablets (650 mg total) by mouth every 6 (six) hours as needed for mild pain (or Fever >/= 101). 11/28/16   Robbie Lis, MD  albuterol (PROVENTIL HFA;VENTOLIN HFA) 108 (90 Base) MCG/ACT inhaler Inhale 1-2 puffs into the lungs every 4 (four) hours as needed for wheezing or shortness of breath. Rescue inhaler 11/28/16   Robbie Lis, MD  ALPRAZolam Duanne Moron) 1 MG tablet Take 1 tablet (1 mg total) by mouth 2 (two) times daily. 12/23/16   Mary-Margaret Hassell Done, FNP  amoxicillin-clavulanate (AUGMENTIN) 875-125 MG tablet Take 1 tablet by mouth 2 (two)  times daily. 01/07/17   Jiles Prows, MD  apixaban (ELIQUIS) 5 MG TABS tablet Take 1 tablet (5 mg total) by mouth 2 (two) times daily. 12/23/16   Mary-Margaret Hassell Done, FNP  benzonatate (TESSALON) 200 MG capsule Take 1 capsule (200 mg total) by mouth 3 (three) times daily as needed for cough. 12/21/16   Sharion Balloon, FNP  carboxymethylcellulose (REFRESH PLUS) 0.5 % SOLN Place 1 drop into both eyes 3 (three) times daily as needed (dryness).     Historical Provider, MD  chlorpheniramine-HYDROcodone (TUSSIONEX) 10-8 MG/5ML SUER Take 5 mLs by mouth every 12 (twelve) hours. 12/31/16   Eustaquio Maize, MD  citalopram (CELEXA) 20 MG tablet Take 1 tablet (20 mg total) by mouth daily. 12/31/16   Eustaquio Maize, MD  cyproheptadine (PERIACTIN) 4 MG tablet Take 1 tablet (4 mg total) by mouth at bedtime. 01/07/17   Jiles Prows, MD  famotidine (PEPCID) 20 MG tablet Take 1 tablet (20 mg total) by mouth at bedtime. 10/04/16   Mary-Margaret Hassell Done, FNP  ferrous sulfate 325 (65 FE) MG tablet Take 1 tablet (325 mg total) by mouth 2 (two) times daily with a meal. 11/22/16   Silver Huguenin Elgergawy, MD  fluconazole (DIFLUCAN) 150 MG tablet TAKE 1 TABLET BY MOUTH EVERY WEEK FOR 4 WEEKS 01/01/17   Eustaquio Maize, MD  fluticasone [facility]) 50 MCG/ACT nasal spray Place 2 sprays into both nostrils daily.    Historical Provider, MD  furosemide (LASIX) 20 MG tablet Take 1 tablet (20 mg total) by mouth daily. 10/04/16   Mary-Margaret Hassell Done, FNP  HYDROcodone-acetaminophen (NORCO/VICODIN) 5-325 MG tablet Take 1 tablet by mouth every 4 (four) hours as needed. 01/11/17   Evalee Jefferson, PA-C  hydrOXYzine (ATARAX/VISTARIL) 50 MG tablet Take 1 tablet (50 mg total) by mouth 3 (three) times daily as needed for itching. 11/04/16   Mary-Margaret Hassell Done, FNP  ipratropium (ATROVENT) 0.03 % nasal spray Place 2 sprays into both nostrils every 12 (twelve) hours. 01/03/17   Claretta Fraise, MD  latanoprost (XALATAN) 0.005 % ophthalmic solution  12/28/16    Historical Provider, MD  losartan (COZAAR) 25 MG tablet Take 1 tablet (25 mg total) by mouth daily. 12/23/16   Mary-Margaret Hassell Done, FNP  metFORMIN (GLUCOPHAGE) 500 MG tablet Take 1 tablet (500 mg total) by mouth 2 (two) times daily with a meal. Patient taking differently: Take 500 mg by mouth 2 (two) times daily as needed (depending on sugar).  10/04/16   Mary-Margaret Hassell Done, FNP  montelukast (SINGULAIR) 10 MG tablet TAKE 1 TABLET BY MOUTH AT BEDTIME 10/31/16   Mary-Margaret Hassell Done, FNP  Olopatadine HCl 0.6 % SOLN Place 1 drop (1 puff total) into the nose every morning. 01/08/17   Jiles Prows, MD  pantoprazole (PROTONIX) 40 MG tablet Take 1 tablet (40 mg total) by mouth daily. 10/04/16   Mary-Margaret Hassell Done, FNP  polyethylene glycol (MIRALAX / GLYCOLAX) packet Take 17 g by mouth daily as needed for mild constipation. 11/22/16   Silver Huguenin Elgergawy, MD  pravastatin (PRAVACHOL) 40 MG tablet TAKE 1 TABLET BY MOUTH EVERY DAY 12/16/16   Mary-Margaret Hassell Done, FNP  SYMBICORT 160-4.5 MCG/ACT inhaler INHALE 2 PUFFS BY MOUTH FIRST THING IN THE MORNING AND THEN 2 PUFFS BY MOUTH 12 HOURS LATER 12/30/16   Mary-Margaret Hassell Done, FNP    Family History Family History  Problem Relation Age of Onset  . Hypertension Mother   . Hypertension Father     Social History Social History  Substance Use Topics  . Smoking status: Former Smoker    Packs/day: 0.25    Years: 5.00    Types: Cigarettes    Quit date: 11/18/1989  . Smokeless tobacco: Never Used  . Alcohol use Yes     Comment: occasional     Allergies   Demerol [meperidine]; Morphine and related; and Diltiazem hcl   Review of Systems Review of Systems  Constitutional: Negative for fever.  Respiratory: Negative for shortness of breath.   Musculoskeletal: Negative for arthralgias, joint swelling and myalgias.  Skin: Positive for color change. Negative for pallor and wound.  Neurological: Negative for weakness and numbness.     Physical  Exam Updated Vital Signs BP 132/72 (BP Location: Left Arm)   Pulse 81   Temp 98.7 F (37.1 C) (Oral)   Resp 16   Ht 5\' 2"  (1.575 m)   Wt 64.4 kg   SpO2 100%   BMI 25.97 kg/m   Physical Exam  Constitutional: She appears well-developed and well-nourished. No distress.  HENT:  Head: Normocephalic.  Neck: Neck supple.  Cardiovascular: Normal rate.   Pulmonary/Chest: Effort normal and breath sounds normal. No respiratory distress. She has no wheezes.  Musculoskeletal: Normal range of motion. She exhibits no edema.  Skin: Skin is warm and dry. Capillary refill takes less than 2 seconds. No erythema.  Mild tender edema left dorsal hand, early ecchymosis.  Distal sensation intact with less than 2 sec cap refill.  No edema or induration, no palpable cords or erythema proximal forearm.      ED Treatments / Results  Labs (all labs ordered are listed, but only abnormal results are displayed) Labs Reviewed - No data to display  EKG  EKG Interpretation None       Radiology No results found.  Procedures Procedures (including critical care time)  Medications Ordered in ED Medications  HYDROcodone-acetaminophen (NORCO/VICODIN) 5-325 MG per tablet 1 tablet (1 tablet Oral Given 01/11/17 1651)     Initial Impression / Assessment and Plan / ED Course  I have reviewed the triage vital signs and the nursing notes.  Pertinent labs & imaging results that were available during my care of the patient were reviewed by me and considered in my medical decision making (see chart for details).     Pt with suggestion of recurrent spontaneous hematoma's left dorsal hand, with exam today consistent with mild early appearing ecchymosis at site.  She was placed in a velcro wrist splint simply to help protect the site from further injuries.  Advised heat tx, elevation, reassurance given that exam today does not show any signs of vascular compromise.  She was given a referral to vascular surgery for  an office visit f/u if her symptoms continue to recur, but I suspect her sx are coincidental occurances.  Pt understands and agrees with plan.  Final Clinical Impressions(s) / ED Diagnoses  Final diagnoses:  Contusion of left hand, initial encounter    New Prescriptions Discharge Medication List as of 01/11/2017  5:21 PM       Evalee Jefferson, PA-C 01/15/17 [place], MD 01/15/17 [phone]

## 2017-01-16 ENCOUNTER — Encounter (INDEPENDENT_AMBULATORY_CARE_PROVIDER_SITE_OTHER): Payer: Self-pay | Admitting: Orthopaedic Surgery

## 2017-01-16 ENCOUNTER — Ambulatory Visit (INDEPENDENT_AMBULATORY_CARE_PROVIDER_SITE_OTHER): Payer: Self-pay

## 2017-01-16 ENCOUNTER — Ambulatory Visit (INDEPENDENT_AMBULATORY_CARE_PROVIDER_SITE_OTHER): Payer: BLUE CROSS/BLUE SHIELD | Admitting: Orthopaedic Surgery

## 2017-01-16 VITALS — BP 120/72 | HR 91 | Ht 62.0 in | Wt 140.0 lb

## 2017-01-16 DIAGNOSIS — M47812 Spondylosis without myelopathy or radiculopathy, cervical region: Secondary | ICD-10-CM | POA: Diagnosis not present

## 2017-01-16 NOTE — Progress Notes (Signed)
Post-Op Visit Note   Patient: Jennifer Mullins           Date of Birth: September 21, 1971           MRN: [id number] Visit Date: 01/16/2017 PCP: Chevis Pretty, FNP   Assessment & Plan:  Chief Complaint:  Chief Complaint  Patient presents with  . Neck - Routine Post Op   Visit Diagnoses:  1. Spondylosis without myelopathy or radiculopathy, cervical region   2.      Postop pulmonary embolism with DVT. She is on Bonney Roussel was.  Plan: Incision is well-healed. Fusion is solid on x-ray. She can discontinue the collar. She's been using a walker since she had pulmonary embolism and can probably progress to a cane and then increase her walking activities and strengthening to help with overall health. She has asthma problems, COPD problems is on multiple inhalers. She's got a wrist splint where she states she had problems with the blood vessel with a superficial vein that had the broken or had a hematoma in that region. Active bleeding no evidence of the tenosynovitis. Fingers reach full extension.  Follow-Up Instructions: No Follow-up on file.   Orders:  Orders Placed This Encounter  Procedures  . XR Cervical Spine 2 or 3 views   No orders of the defined types were placed in this encounter.  HPI Patient returns status post C5-6, C6-7 ACD&F, Allograft, Plate 11/13/2016.  She states that she has a little numbness in her left arm today. She was seen in the ED on 01/11/2017 when a vein ruptured in her left hand. She states that the ED is referring her to Dr. Adele Barthel. She is taking Hydrocodone for the multiple issues.  Imaging: Xr Cervical Spine 2 Or 3 Views  Result Date: 01/16/2017 Lateral flexion-extension C-spine x-ray show no motion and good incorporation at the lower level. C5-6 level is incorporating slightly slower but there is no motion no loosening of the screws good position of the plate. Impression: Satisfactory postop two-level cervical fusion with good healing no motion.   PMFS  History: Patient Active Problem List   Diagnosis Date Noted  . Pulmonary embolism ([place]) 11/24/2016  . Dysfunctional uterine bleeding 11/24/2016  . Anticoagulated 11/24/2016  . Microcytic anemia 11/24/2016  . Pulmonary embolus ([place])   . Tachycardia   . Vaginal bleeding   . Pulmonary emboli ([place]) 11/19/2016  . Spondylosis without myelopathy or radiculopathy, cervical region 11/13/2016  . Allergic rhinoconjunctivitis 07/28/2015  . Chronic headache 07/28/2015  . PCOS (polycystic ovarian syndrome) 06/20/2015  . 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 Hyperlipidemia LDL goal <100 06/20/2015  . GAD (generalized anxiety disorder) 05/18/2014  . GERD (gastroesophageal reflux disease) 05/18/2014  . Body fluid retention 07/07/2013  . Chronic sinusitis 07/02/2013  . Extrinsic asthma 05/20/2013  . HTN (hypertension) 05/20/2013  . Prediabetes 05/20/2013   Past Medical History:  Diagnosis Date  . Anemia   . Anxiety   . Arthritis   . Asthma   . Bilateral calcaneal spurs   . Bilateral polycystic ovarian syndrome   . Bulging lumbar disc   . Carpal tunnel syndrome, bilateral   . COPD (chronic obstructive pulmonary disease) (HCC)    BRONCHITIS  . Diabetes mellitus without complication ([place])    TYPE 2   DX  4-5 YRS AGO  . Fibroids    uterine  . GAD (generalized anxiety disorder)   . GERD (gastroesophageal reflux disease)    TAKES PRESCRIPTION MEDS  . Glaucoma  BOTH EYES  . Hyperlipidemia   . Hypertension   . Hypothyroidism    NODULES ON THYROID  . PONV (postoperative nausea and vomiting)   . Pulmonary embolism ([place])   . Tachycardia     Family History  Problem Relation Age of Onset  . Hypertension Mother   . Hypertension Father     Past Surgical History:  Procedure Laterality Date  . ANTERIOR CERVICAL DECOMP/DISCECTOMY FUSION N/A 11/13/2016   Procedure: Cervical five-six, Cervical six-seven Anterior Cervical Discectomy and Fusion, Allograft, Plate;  Surgeon: Marybelle Killings,  MD;  Location: [facility];  Service: Orthopedics;  Laterality: N/A;  . BREAST SURGERY    . CARPAL TUNNEL RELEASE    . DILATION AND CURETTAGE OF UTERUS    . EXPLORATORY LAPAROTOMY    . NASAL SINUS SURGERY  07/22/13   Dr. Redmond Pulling in [place] History  . Unemployed    Social History Main Topics  . Smoking status: Former Smoker    Packs/day: 0.25    Years: 5.00    Types: Cigarettes    Quit date: 11/18/1989  . Smokeless tobacco: Never Used  . Alcohol use Yes     Comment: occasional  . Drug use: No  . Sexual activity: Not on file

## 2017-01-17 ENCOUNTER — Telehealth (INDEPENDENT_AMBULATORY_CARE_PROVIDER_SITE_OTHER): Payer: Self-pay | Admitting: Orthopaedic Surgery

## 2017-01-17 MED ORDER — ACETAMINOPHEN-CODEINE #3 300-30 MG PO TABS: 1.0000 | ORAL_TABLET | Freq: Three times a day (TID) | ORAL | 0 refills | Status: DC | PRN

## 2017-01-17 NOTE — Addendum Note (Signed)
Addended by: Meyer Cory on: 01/17/2017 02:07 PM   Modules accepted: Orders

## 2017-01-17 NOTE — Telephone Encounter (Signed)
Patient called asked if she can get something for pain. Patient asked if she can pick the Rx up at the [facility] office. The number to contact her is [phone]

## 2017-01-17 NOTE — Telephone Encounter (Signed)
Tylenol # 3    # 20    1 po tid prn pain.ucall thanks  Send to [facility].  I called.

## 2017-01-17 NOTE — Telephone Encounter (Signed)
Please advise 

## 2017-01-17 NOTE — Telephone Encounter (Signed)
Called to pharmacy 

## 2017-01-20 ENCOUNTER — Ambulatory Visit (INDEPENDENT_AMBULATORY_CARE_PROVIDER_SITE_OTHER): Payer: BLUE CROSS/BLUE SHIELD | Admitting: Pediatrics

## 2017-01-20 ENCOUNTER — Other Ambulatory Visit: Payer: Self-pay | Admitting: Pediatrics

## 2017-01-20 ENCOUNTER — Encounter: Payer: Self-pay | Admitting: Pediatrics

## 2017-01-20 ENCOUNTER — Ambulatory Visit: Payer: BLUE CROSS/BLUE SHIELD | Admitting: Nurse Practitioner

## 2017-01-20 VITALS — BP 138/89 | HR 90 | Temp 97.6°F | Ht 62.0 in | Wt 142.2 lb

## 2017-01-20 DIAGNOSIS — M542 Cervicalgia: Secondary | ICD-10-CM

## 2017-01-20 DIAGNOSIS — I10 Essential (primary) hypertension: Secondary | ICD-10-CM

## 2017-01-20 DIAGNOSIS — D649 Anemia, unspecified: Secondary | ICD-10-CM

## 2017-01-20 DIAGNOSIS — F411 Generalized anxiety disorder: Secondary | ICD-10-CM | POA: Diagnosis not present

## 2017-01-20 DIAGNOSIS — E785 Hyperlipidemia, unspecified: Secondary | ICD-10-CM

## 2017-01-20 DIAGNOSIS — R739 Hyperglycemia, unspecified: Secondary | ICD-10-CM

## 2017-01-20 LAB — BAYER DCA HB A1C WAIVED: HB A1C (BAYER DCA - WAIVED): 5.6 % (ref <7.0)

## 2017-01-20 MED ORDER — PRAVASTATIN SODIUM 20 MG PO TABS: 20.0000 mg | ORAL_TABLET | Freq: Every day | ORAL | 2 refills | Status: DC

## 2017-01-20 MED ORDER — LOSARTAN POTASSIUM 50 MG PO TABS: 50.0000 mg | ORAL_TABLET | Freq: Every day | ORAL | 2 refills | Status: DC

## 2017-01-20 MED ORDER — ALPRAZOLAM 0.5 MG PO TABS: 1.0000 mg | ORAL_TABLET | Freq: Three times a day (TID) | ORAL | 1 refills | Status: DC | PRN

## 2017-01-20 NOTE — Progress Notes (Signed)
Subjective:   Patient ID: Jennifer Mullins, female    DOB: 1971-10-03, 46 y.o.   MRN: [id number] CC: Follow-up  HPI: Jennifer Mullins is a 46 y.o. female presenting for Follow-up  Recent neck fusion: follow up with ortho last week, healing well Cleared for not using cervical collar all the time Says she was told can use collar if needed, she does think it helps for support when neck is tired after not bein gin collar Still using walker, has been told can start to use cane  Breathing is better: not needing rescue inhaler  Has been able to lay more flat at night, still in hospital bed  Anxiety: has been ongoing problem since hospitalization Thinks is getting better Has been able to lay more flat at night as breathing has gotten easier Takes citalopram daily Tried increasing to 60m, says she got mean, had side effects Taking xanax 1 mg BID, dose increased while in the hospital  PE: still on anti-coagulant  Sinusitis: on augmentin for ongoing sinus symptoms, following with allergy  Relevant past medical, surgical, family and social history reviewed. Allergies and medications reviewed and updated. History  Smoking Status  . Former Smoker  . Packs/day: 0.25  . Years: 5.00  . Types: Cigarettes  . Quit date: 11/18/1989  Smokeless Tobacco  . Never Used   ROS: Per HPI   Objective:    BP 138/89   Pulse 90   Temp 97.6 F (36.4 C) (Oral)   Ht '5\' 2"'  (1.575 m)   Wt 142 lb 3.2 oz (64.5 kg)   BMI 26.01 kg/m   Wt Readings from Last 3 Encounters:  01/20/17 142 lb 3.2 oz (64.5 kg)  01/16/17 140 lb (63.5 kg)  01/11/17 142 lb (64.4 kg)    Gen: NAD, alert, cooperative with exam, NCAT EYES: EOMI, no conjunctival injection, or no icterus CV: NRRR, normal S1/S2, no murmur Resp: CTABL, no wheezes, normal WOB Ext: No edema, warm Neuro: Alert and oriented, strength equal b/l UE and LE MSK: normal muscle bulk Psych: full affect, no thoughts of self harm  Assessment & Plan:  [place]  seen today for follow-up multiple med problems  Diagnoses and all orders for this visit:  Generalized anxiety disorder Symptoms improved as medical problems have slowly been improving Cont citalopram, did not tolerate dose increase Will start to wean xanax to pre-hospitalization dosing, was on 152mBID, will give 0.38m638mabs, enough to take TID prn daily. #90 tabs with 1 refill, must be seen for refills. -     TSH -     ALPRAZolam (XANAX) 0.5 MG tablet; Take 1 tablets by mouth 3 (three) times daily as needed for anxiety.  Hyperlipidemia LDL goal <100 Stable, cont below -     pravastatin (PRAVACHOL) 20 MG tablet; Take 1 tablet (20 mg total) by mouth daily.  Essential hypertension Slightly elevated, cont to follow, cont below -     losartan (COZAAR) 50 MG tablet; Take 1 tablet (50 mg total) by mouth daily. -     BMP8+EGFR  Anemia, unspecified type Recheck Hg -     CBC with Differential/Platelet  Hyperglycemia H/o pre-diabetes, Check below -     Bayer DCA Hb A1c Waived -     Urinalysis -     Microalbumin / creatinine urine ratio  Neck pain Cervical fusion 2 mo ago Pain has been improving Cleared to go without cervical collar On Tylenol #3 prescribed by Dr. YatLorin Mercyoal to continue wean  off of narcotics for pain  Follow up plan: Return in about 2 months (around 03/22/2017). [name], MD [place]

## 2017-01-22 LAB — CBC WITH DIFFERENTIAL/PLATELET
Basophils Absolute: 0 10*3/uL (ref 0.0–0.2)
Basos: 0 %
EOS (ABSOLUTE): 0.1 10*3/uL (ref 0.0–0.4)
Eos: 2 %
Hematocrit: 33.4 % — ABNORMAL LOW (ref 34.0–46.6)
Hemoglobin: 10.4 g/dL — ABNORMAL LOW (ref 11.1–15.9)
Immature Grans (Abs): 0 10*3/uL (ref 0.0–0.1)
Immature Granulocytes: 0 %
Lymphocytes Absolute: 1.6 10*3/uL (ref 0.7–3.1)
Lymphs: 31 %
MCH: 26.8 pg (ref 26.6–33.0)
MCHC: 31.1 g/dL — ABNORMAL LOW (ref 31.5–35.7)
MCV: 86 fL (ref 79–97)
Monocytes Absolute: 0.3 10*3/uL (ref 0.1–0.9)
Monocytes: 7 %
Neutrophils Absolute: 3.1 10*3/uL (ref 1.4–7.0)
Neutrophils: 60 %
Platelets: 259 10*3/uL (ref 150–379)
RBC: 3.88 x10E6/uL (ref 3.77–5.28)
RDW: 18.4 % — ABNORMAL HIGH (ref 12.3–15.4)
WBC: 5.1 10*3/uL (ref 3.4–10.8)

## 2017-01-22 LAB — BMP8+EGFR
BUN/Creatinine Ratio: 14 (ref 9–23)
BUN: 8 mg/dL (ref 6–24)
CO2: 22 mmol/L (ref 18–29)
Calcium: 8.8 mg/dL (ref 8.7–10.2)
Chloride: 104 mmol/L (ref 96–106)
Creatinine, Ser: 0.59 mg/dL (ref 0.57–1.00)
GFR calc Af Amer: 125 mL/min/{1.73_m2} (ref >59)
GFR calc non Af Amer: 109 mL/min/{1.73_m2} (ref >59)
Glucose: 74 mg/dL (ref 65–99)
Potassium: 4.4 mmol/L (ref 3.5–5.2)
Sodium: 143 mmol/L (ref 134–144)

## 2017-01-22 LAB — TSH: TSH: 0.472 u[IU]/mL (ref 0.450–4.500)

## 2017-01-24 ENCOUNTER — Other Ambulatory Visit: Payer: Self-pay | Admitting: *Deleted

## 2017-01-24 DIAGNOSIS — J320 Chronic maxillary sinusitis: Secondary | ICD-10-CM

## 2017-01-24 DIAGNOSIS — J328 Other chronic sinusitis: Secondary | ICD-10-CM

## 2017-01-24 NOTE — Progress Notes (Signed)
Per [facility] needed new order put in due to they no longer do limited sinus only maxillary. Order done

## 2017-01-28 ENCOUNTER — Telehealth: Payer: Self-pay | Admitting: Allergy and Immunology

## 2017-01-28 MED ORDER — FLUCONAZOLE 150 MG PO TABS: ORAL_TABLET | ORAL | 0 refills | Status: DC

## 2017-01-28 NOTE — Telephone Encounter (Signed)
Per Dr Neldon Mc called out Diflucan 150 #2 tablets. Patient advised also advised can walk in for CT at [facility] imaging [phone] Mon- Fri. Gave her address and she advised will get this Fri. Or Mon.

## 2017-01-28 NOTE — Telephone Encounter (Signed)
Pt called and said that we were suppose to call her to set up a time for her to go and get ct scan done. She has a appointment on 02/04/2017 to see Dr. Neldon Mc.

## 2017-01-28 NOTE — Telephone Encounter (Signed)
Tammy, please help.  I saw that you put in a new CT SCAN order last week. Do you know the status of CT SCAN?

## 2017-01-28 NOTE — Telephone Encounter (Signed)
Patient already [facility] approval from note I saw for order.  She can walk in for sinus CT last time I checked

## 2017-01-28 NOTE — Telephone Encounter (Signed)
Pt called and would like for you to call in Diflucan into [facility].

## 2017-01-31 ENCOUNTER — Ambulatory Visit
Admission: RE | Admit: 2017-01-31 | Discharge: 2017-01-31 | Disposition: A | Discharge status: Home / Self Care | Payer: Disability Insurance | Source: Ambulatory Visit | Attending: Allergy and Immunology | Admitting: Allergy and Immunology

## 2017-01-31 DIAGNOSIS — J328 Other chronic sinusitis: Secondary | ICD-10-CM

## 2017-01-31 DIAGNOSIS — J329 Chronic sinusitis, unspecified: Secondary | ICD-10-CM | POA: Diagnosis not present

## 2017-02-04 ENCOUNTER — Encounter: Payer: Self-pay | Admitting: Allergy and Immunology

## 2017-02-04 ENCOUNTER — Ambulatory Visit (INDEPENDENT_AMBULATORY_CARE_PROVIDER_SITE_OTHER): Payer: BLUE CROSS/BLUE SHIELD | Admitting: Allergy and Immunology

## 2017-02-04 VITALS — BP 112/78 | HR 92 | Resp 22

## 2017-02-04 DIAGNOSIS — K219 Gastro-esophageal reflux disease without esophagitis: Secondary | ICD-10-CM | POA: Diagnosis not present

## 2017-02-04 DIAGNOSIS — J309 Allergic rhinitis, unspecified: Secondary | ICD-10-CM | POA: Diagnosis not present

## 2017-02-04 DIAGNOSIS — H6982 Other specified disorders of Eustachian tube, left ear: Secondary | ICD-10-CM

## 2017-02-04 DIAGNOSIS — H1010 Acute atopic conjunctivitis, unspecified eye: Secondary | ICD-10-CM | POA: Diagnosis not present

## 2017-02-04 DIAGNOSIS — R51 Headache: Secondary | ICD-10-CM | POA: Diagnosis not present

## 2017-02-04 DIAGNOSIS — J4540 Moderate persistent asthma, uncomplicated: Secondary | ICD-10-CM

## 2017-02-04 DIAGNOSIS — R519 Headache, unspecified: Secondary | ICD-10-CM

## 2017-02-04 NOTE — Patient Instructions (Signed)
  1. Visit with [facility] regarding hearing and ETD    2. Continue to Treat and prevent inflammation:   A. Symbicort 160 - 2 inhalations twice a day  C. Flonase - one spray each nostril twice a day  D. montelukast - one tablet one time per day  3. Continue to Treat and prevent reflux:   A. pantoprazole 40 mg in a.m.  B. famotidine 20 mg in PM  4. Treat and prevent headaches:   A. continue cyproheptadine 4 mg tablet at bedtime  5. If needed:   A. Patanase 2 sprays each nostril two times per day  B. OTC antihistamine  C. Proventil HFA or similar 2 inhalations every 4-6 hours  6. Consider a course of immunotherapy  8. Return to clinic in Summer 2018 or earlier if problem

## 2017-02-04 NOTE — Progress Notes (Signed)
Follow-up Note  Referring Provider: Chevis Pretty, * Primary Provider: Eustaquio Maize, MD Date of Office Visit: 02/04/2017  Subjective:   Jennifer Mullins (DOB: 28-May-1971) is a 46 y.o. female who returns to the [facility] on 02/04/2017 in re-evaluation of the following:  HPI: Jennifer Mullins returns to this clinic in reevaluation of her asthma and allergic rhinitis and history of chronic sinusitis and history of chronic cephalgia and ETD and reflux. I last saw her in his clinic 01/07/2017 after not seeing her for approximately 3 years. At that point in time we assigned a plan to address each issue.  She is doing well regarding her respiratory tract. She has no problems with asthma. Her requirement for a short acting bronchodilator is one time per week. She has no nocturnal bronchospastic symptoms. Her nose is doing very well. She has very little issues with nasal congestion or sneezing and she can smell without any difficulty. A recent sinus CT scan obtained after using a full 21 days of Augmentin identified no significant amount of residual sinus disease.  She has been having problems with intermittent muffled hearing especially on the left. This is been a long-standing issue and she may be somewhat worse this year. She does not have any associated vertigo or tinnitus.  Her reflux is doing well as is her throat but she still has the sensation of an occasional thickness in her throat ever since she had her recent cervical surgery.  Headaches still appear to be under very good control with the use of cyproheptadine.  Allergies as of 02/04/2017      Reactions   Demerol [meperidine] Shortness Of Breath   FLUSHING AND SHORTNESS OF BREATH   Morphine And Related Shortness Of Breath   Flushed and hot hyper   Diltiazem Hcl Hives   Lisinopril    Caused cough      Medication List      ALPRAZolam 0.5 MG tablet Commonly known as:  XANAX Take 2 tablets (1 mg total) by mouth  3 (three) times daily as needed for anxiety.   carboxymethylcellulose 0.5 % Soln Commonly known as:  REFRESH PLUS Place 1 drop into both eyes 3 (three) times daily as needed (dryness).   citalopram 20 MG tablet Commonly known as:  CELEXA Take 1 tablet (20 mg total) by mouth daily.   cyproheptadine 4 MG tablet Commonly known as:  PERIACTIN Take 1 tablet (4 mg total) by mouth at bedtime.   ELIQUIS 5 MG Tabs tablet Generic drug:  apixaban TAKE 1 TABLET BY MOUTH TWICE DAILY   famotidine 20 MG tablet Commonly known as:  PEPCID Take 1 tablet (20 mg total) by mouth at bedtime.   ferrous sulfate 325 (65 FE) MG tablet Take 1 tablet (325 mg total) by mouth 2 (two) times daily with a meal.   fluticasone 50 MCG/ACT nasal spray Commonly known as:  FLONASE Place 2 sprays into both nostrils daily.   furosemide 20 MG tablet Commonly known as:  LASIX Take 1 tablet (20 mg total) by mouth daily.   hydrOXYzine 50 MG tablet Commonly known as:  ATARAX/VISTARIL Take 1 tablet (50 mg total) by mouth 3 (three) times daily as needed for itching.   ipratropium 0.03 % nasal spray Commonly known as:  ATROVENT Place 2 sprays into both nostrils every 12 (twelve) hours.   latanoprost 0.005 % ophthalmic solution Commonly known as:  XALATAN   losartan 50 MG tablet Commonly known as:  COZAAR  Take 1 tablet (50 mg total) by mouth daily.   metFORMIN 500 MG tablet Commonly known as:  GLUCOPHAGE Take 1 tablet (500 mg total) by mouth 2 (two) times daily with a meal.   montelukast 10 MG tablet Commonly known as:  SINGULAIR TAKE 1 TABLET BY MOUTH AT BEDTIME   pantoprazole 40 MG tablet Commonly known as:  PROTONIX Take 1 tablet (40 mg total) by mouth daily.   polyethylene glycol packet Commonly known as:  MIRALAX / GLYCOLAX Take 17 g by mouth daily as needed for mild constipation.   pravastatin 20 MG tablet Commonly known as:  PRAVACHOL Take 1 tablet (20 mg total) by mouth daily.   albuterol  (2.5 MG/3ML) 0.083% nebulizer solution Commonly known as:  PROVENTIL Take 2.5 mg by nebulization every 6 (six) hours as needed for wheezing or shortness of breath.   PROAIR HFA 108 (90 Base) MCG/ACT inhaler Generic drug:  albuterol INHALE 1-2 PUFFS EVERY FOUR HOURS AS NEEDED WHEEZING SHORTNESS OF BREATH   SYMBICORT 160-4.5 MCG/ACT inhaler Generic drug:  budesonide-formoterol INHALE 2 PUFFS BY MOUTH FIRST THING IN THE MORNING AND THEN 2 PUFFS BY MOUTH 12 HOURS LATER       Past Medical History:  Diagnosis Date  . Anemia   . Anxiety   . Arthritis   . Asthma   . Bilateral calcaneal spurs   . Bilateral polycystic ovarian syndrome   . Bulging lumbar disc   . Carpal tunnel syndrome, bilateral   . COPD (chronic obstructive pulmonary disease) (HCC)    BRONCHITIS  . Diabetes mellitus without complication ([place])    TYPE 2   DX  4-5 YRS AGO  . Fibroids    uterine  . GAD (generalized anxiety disorder)   . GERD (gastroesophageal reflux disease)    TAKES PRESCRIPTION MEDS  . Glaucoma    BOTH EYES  . Hematoma    Hematoma of Left Hand- had to wear a split  . Hyperlipidemia   . Hypertension   . Hypothyroidism    NODULES ON THYROID  . PONV (postoperative nausea and vomiting)   . Pulmonary embolism ([place])   . Tachycardia     Past Surgical History:  Procedure Laterality Date  . ANTERIOR CERVICAL DECOMP/DISCECTOMY FUSION N/A 11/13/2016   Procedure: Cervical five-six, Cervical six-seven Anterior Cervical Discectomy and Fusion, Allograft, Plate;  Surgeon: Marybelle Killings, MD;  Location: [facility];  Service: Orthopedics;  Laterality: N/A;  . BREAST SURGERY    . CARPAL TUNNEL RELEASE    . DILATION AND CURETTAGE OF UTERUS    . EXPLORATORY LAPAROTOMY    . NASAL SINUS SURGERY  07/22/13   Dr. Redmond Pulling in [place]    Review of systems negative except as noted in HPI / PMHx or noted below:  Review of Systems  Constitutional: Negative.   HENT: Negative.   Eyes: Negative.   Respiratory: Negative.     Cardiovascular: Negative.   Gastrointestinal: Negative.   Genitourinary: Negative.   Musculoskeletal: Negative.   Skin: Negative.   Neurological: Negative.   Endo/Heme/Allergies: Negative.   Psychiatric/Behavioral: Negative.      Objective:   Vitals:   02/04/17 1050  BP: 112/78  Pulse: 92  Resp: (!) 22          Physical Exam  Constitutional: She is well-developed, well-nourished, and in no distress.  HENT:  Head: Normocephalic.  Right Ear: Tympanic membrane, external ear and ear canal normal.  Left Ear: Tympanic membrane, external ear and ear canal normal.  Nose: Nose normal. No mucosal edema or rhinorrhea.  Mouth/Throat: Uvula is midline, oropharynx is clear and moist and mucous membranes are normal. No oropharyngeal exudate.  Eyes: Conjunctivae are normal.  Neck: Trachea normal. No tracheal tenderness present. No tracheal deviation present. No thyromegaly present.  Cardiovascular: Normal rate, regular rhythm, S1 normal, S2 normal and normal heart sounds.   No murmur heard. Pulmonary/Chest: Breath sounds normal. No stridor. No respiratory distress. She has no wheezes. She has no rales.  Musculoskeletal: She exhibits no edema.  Lymphadenopathy:       Head (right side): No tonsillar adenopathy present.       Head (left side): No tonsillar adenopathy present.    She has no cervical adenopathy.  Neurological: She is alert. Gait normal.  Skin: No rash noted. She is not diaphoretic. No erythema. Nails show no clubbing.  Psychiatric: Mood and affect normal.    Diagnostics: Results of a sinus CT scan obtained 01/31/2017 after 21 days of Augmentin identified the following:   Frontal sinuses:  Clear and normal.  Ethmoid sinuses:  Clear and normal.  Sphenoid sinus:  Clear and normal.  Maxillary sinuses: Clear and normal with exception of a small retention cyst along the anterior wall of the left maxillary sinus, not significant.  Ostiomeatal complexes are widely  patent bilaterally. No unfavorable anatomic variants. The patient does have concha bullosa of the middle turbinate on the left which contains some fluid. Nasal passages appear widely patent.  Olfactory grooves are symmetric and normal. Lamina papyracea are intact. Anterior ethmoidal notches are symmetric and normal.  Orbits appear normal. Visualized intracranial contents appear normal. No facial soft tissue lesions seen.  Assessment and Plan:   1. Asthma, moderate persistent, well-controlled   2. Allergic rhinoconjunctivitis   3. Headache disorder   4. LPRD (laryngopharyngeal reflux disease)   5. ETD (Eustachian tube dysfunction), left     1. Visit with [facility] regarding hearing and ETD    2. Continue to Treat and prevent inflammation:   A. Symbicort 160 - 2 inhalations twice a day  C. Flonase - one spray each nostril twice a day  D. montelukast - one tablet one time per day  3. Continue to Treat and prevent reflux:   A. pantoprazole 40 mg in a.m.  B. famotidine 20 mg in PM  4. Treat and prevent headaches:   A. continue cyproheptadine 4 mg tablet at bedtime  5. If needed:   A. Patanase 2 sprays each nostril two times per day  B. OTC antihistamine  C. Proventil HFA or similar 2 inhalations every 4-6 hours  6. Consider a course of immunotherapy  8. Return to clinic in Summer 2018 or earlier if problem  Jennifer Mullins will continue to use anti-inflammatory agents for her respiratory tract as they appear to be helping her regarding her atopic respiratory disease and she will also continue to treat reflux with a combination of a proton pump inhibitor and H2 receptor blocker as this combination has definitely helped with her LPR. As well, we will continue her on cyproheptadine to prevent her headaches which has really worked quite well. She would definitely be a candidate for immunotherapy given her multiorgan atopic disease and the fact that she requires so much medication to  keep this under control. Whether or not this control will be maintained as we go through this upcoming springtime season is an open question. She still continues to have muffled hearing most likely secondary to ETD and we will send  her to have a thorough evaluation of this issue with an ENT doctor. If she does well I will see her back in this clinic in the summer of 2018 or earlier if there is a problem.  Allena Katz, MD Allergy / Immunology [place]

## 2017-02-05 ENCOUNTER — Encounter: Payer: Self-pay | Admitting: Pediatrics

## 2017-02-05 ENCOUNTER — Ambulatory Visit (INDEPENDENT_AMBULATORY_CARE_PROVIDER_SITE_OTHER): Payer: BLUE CROSS/BLUE SHIELD | Admitting: Pediatrics

## 2017-02-05 ENCOUNTER — Telehealth (INDEPENDENT_AMBULATORY_CARE_PROVIDER_SITE_OTHER): Payer: Self-pay | Admitting: Orthopaedic Surgery

## 2017-02-05 VITALS — BP 122/73 | HR 108 | Temp 97.6°F | Ht 62.0 in | Wt 141.6 lb

## 2017-02-05 DIAGNOSIS — I2699 Other pulmonary embolism without acute cor pulmonale: Secondary | ICD-10-CM | POA: Diagnosis not present

## 2017-02-05 DIAGNOSIS — I10 Essential (primary) hypertension: Secondary | ICD-10-CM | POA: Diagnosis not present

## 2017-02-05 DIAGNOSIS — M542 Cervicalgia: Secondary | ICD-10-CM

## 2017-02-05 MED ORDER — ACETAMINOPHEN-CODEINE #3 300-30 MG PO TABS: 1.0000 | ORAL_TABLET | Freq: Two times a day (BID) | ORAL | 0 refills | Status: DC | PRN

## 2017-02-05 NOTE — Telephone Encounter (Signed)
Ok thx.

## 2017-02-05 NOTE — Telephone Encounter (Signed)
Pt requesting an RX for a cane to Jennifer Mullins.  [phone] phone  [phone] fax

## 2017-02-05 NOTE — Telephone Encounter (Signed)
Ok for rx?

## 2017-02-05 NOTE — Progress Notes (Signed)
  Subjective:   Patient ID: Jennifer Mullins, female    DOB: 11/26/1970, 46 y.o.   MRN: [id number] CC: Generalized Body Aches (arms, legs and neck)  HPI: Jennifer Mullins is a 46 y.o. female presenting for Generalized Body Aches (arms, legs and neck)  Saw allergy yesterday Has been referred to ENT for eustachian tube dysfunction  Continues to have some aches and pains in arms and legs since the surgery Says now that she has gotten up moving around more she is noticing more She is using cane today Still takes neck collar on and off throughout the day as needed Aching she says is 8/10 pain today Filled a Rx for percocet that she says is too strong so she is not taking it Planning to throw it away at home Was recently prescribed tylenol #3, has been helping some with arm and leg pain Has been taking 1/2 to whole pill when needed Says she ran out several days ago, pain has been limiting her movements since then  PE: provoked, on blood thinner, no bleeding  HTN: on losartan 50mg , BP has been well controlled per pt at home No CP, no SOB  Anxiety: taking celexa regularly TID xanax, decreased from last visit, has been doing fine  Relevant past medical, surgical, family and social history reviewed. Allergies and medications reviewed and updated. History  Smoking Status  . Former Smoker  . Packs/day: 0.25  . Years: 5.00  . Types: Cigarettes  . Quit date: 11/18/1989  Smokeless Tobacco  . Never Used   ROS: Per HPI   Objective:    BP 122/73   Pulse (!) 108   Temp 97.6 F (36.4 C) (Oral)   Ht 5\' 2"  (1.575 m)   Wt 141 lb 9.6 oz (64.2 kg)   BMI 25.90 kg/m   Wt Readings from Last 3 Encounters:  02/05/17 141 lb 9.6 oz (64.2 kg)  01/20/17 142 lb 3.2 oz (64.5 kg)  01/16/17 140 lb (63.5 kg)    Gen: NAD, alert, cooperative with exam, NCAT EYES: EOMI, no conjunctival injection, or no icterus ENT:   OP without erythema LYMPH: no cervical LAD CV: NRRR, normal S1/S2, no murmur, distal  pulses 2+ b/l Resp: CTABL, no wheezes, normal WOB Ext: No edema, warm Neuro: Alert and oriented, strength equal b/l 5/5 hamstring/quad, 5/5 hand grip.  MSK: no point tenderness over spine, can turn head to L and R, improving ROM  Assessment & Plan:  [name] was seen today for generalized body aches.  Diagnoses and all orders for this visit:  Neck pain Fusion 2 months ago Increasing activity, decreasing collar use during the day, continues to wear it some daily Using cane today, not walker Continues to have some pain in neck, arms and legs, not one place that bothers her the most Will give #60 tabs of below, take twice a day for the next two weeks, continue to decrease and self wean. Plan for no further narcotics after this Rx. -     acetaminophen-codeine (TYLENOL #3) 300-30 MG tablet; Take 1 tablet by mouth every 12 (twelve) hours as needed for moderate pain.  Essential hypertension Well controlled today, cont current meds  Other pulmonary embolism without acute cor pulmonale, unspecified chronicity ([place]) On eliquis for provoked PE following surgery No prior blood clots per pt Will anticoagulate for 6 months then disconinue  Follow up plan: 3 mo [name], MD [place]

## 2017-02-05 NOTE — Telephone Encounter (Signed)
Script sent to [facility]

## 2017-02-06 ENCOUNTER — Telehealth (INDEPENDENT_AMBULATORY_CARE_PROVIDER_SITE_OTHER): Payer: Self-pay | Admitting: Orthopaedic Surgery

## 2017-02-06 NOTE — Telephone Encounter (Signed)
Patient called needing a new RX faxed over with the prescription code please for her walker. CB # [phone]

## 2017-02-06 NOTE — Telephone Encounter (Signed)
re-faxed

## 2017-02-07 ENCOUNTER — Telehealth (INDEPENDENT_AMBULATORY_CARE_PROVIDER_SITE_OTHER): Payer: Self-pay | Admitting: *Deleted

## 2017-02-07 NOTE — Telephone Encounter (Signed)
Patient called and said that the script sent to [facility] does not have a code on it.  Please resend to [facility].  CB#[phone].  Thank you.

## 2017-02-07 NOTE — Telephone Encounter (Signed)
Faxed again

## 2017-02-07 NOTE — Telephone Encounter (Signed)
Pt called back stating the fax was not received by [facility]. Asking if we can refax it please

## 2017-02-10 ENCOUNTER — Telehealth (INDEPENDENT_AMBULATORY_CARE_PROVIDER_SITE_OTHER): Payer: Self-pay | Admitting: Orthopaedic Surgery

## 2017-02-10 NOTE — Telephone Encounter (Signed)
I called and spoke with Diane at [facility]. She states that they had all of the information that they needed on the last Rx that I faxed them. They have called the patient and she is going to come in sometime this week to pick up the cane.

## 2017-02-10 NOTE — Telephone Encounter (Signed)
Patient called needing her prescription faxed over to her pharmacy in [place] again with a diagnosis code. Thank you! Pharmacy phone # [phone]  Pharmacy fax # [phone]

## 2017-02-11 DIAGNOSIS — R070 Pain in throat: Secondary | ICD-10-CM | POA: Diagnosis not present

## 2017-02-11 DIAGNOSIS — M47812 Spondylosis without myelopathy or radiculopathy, cervical region: Secondary | ICD-10-CM | POA: Diagnosis not present

## 2017-02-11 DIAGNOSIS — R05 Cough: Secondary | ICD-10-CM | POA: Diagnosis not present

## 2017-02-11 DIAGNOSIS — H6983 Other specified disorders of Eustachian tube, bilateral: Secondary | ICD-10-CM | POA: Diagnosis not present

## 2017-02-12 ENCOUNTER — Telehealth: Payer: Self-pay | Admitting: Pediatrics

## 2017-02-12 NOTE — Telephone Encounter (Signed)
If she thinks her heart rate is that much faster than usual, she needs to be seen to make sure it is not in a funny rhythm. Sometimes people will not notice the rhythm, just the increased rate.

## 2017-02-12 NOTE — Telephone Encounter (Signed)
Pt notified of recommendation Verbalizes understanding Pt declined appt at this time Will call back if sxs worsen or persist

## 2017-02-24 ENCOUNTER — Encounter: Payer: Self-pay | Admitting: Vascular Surgery

## 2017-02-25 DIAGNOSIS — H40053 Ocular hypertension, bilateral: Secondary | ICD-10-CM | POA: Diagnosis not present

## 2017-02-26 ENCOUNTER — Other Ambulatory Visit: Payer: Self-pay

## 2017-02-26 DIAGNOSIS — M79642 Pain in left hand: Secondary | ICD-10-CM

## 2017-02-26 DIAGNOSIS — M7989 Other specified soft tissue disorders: Secondary | ICD-10-CM

## 2017-02-27 ENCOUNTER — Encounter (INDEPENDENT_AMBULATORY_CARE_PROVIDER_SITE_OTHER): Payer: Self-pay | Admitting: Orthopaedic Surgery

## 2017-02-27 ENCOUNTER — Ambulatory Visit (INDEPENDENT_AMBULATORY_CARE_PROVIDER_SITE_OTHER): Payer: BLUE CROSS/BLUE SHIELD | Admitting: Orthopaedic Surgery

## 2017-02-27 ENCOUNTER — Ambulatory Visit (INDEPENDENT_AMBULATORY_CARE_PROVIDER_SITE_OTHER): Payer: Self-pay

## 2017-02-27 VITALS — BP 118/82 | HR 103 | Ht 62.0 in | Wt 140.0 lb

## 2017-02-27 DIAGNOSIS — Z981 Arthrodesis status: Secondary | ICD-10-CM | POA: Diagnosis not present

## 2017-02-27 NOTE — Progress Notes (Signed)
Office Visit Note   Patient: Jennifer Mullins           Date of Birth: [birth year]           MRN: [id number] Visit Date: 02/27/2017              Requested by: Eustaquio Maize, MD [address] [place], [place] [address] PCP: Eustaquio Maize, MD   Assessment & Plan: Visit Diagnoses:  1. S/P cervical spinal fusion     Plan:Use some Aspercreme or heating pad on her neck to loosen up. Family members can help her with some gentle massage of her neck. These are collar off and on if needed. She is working away from the walker to the cane at work with a off the cane. I reassured her x-rays look good and the graft appears to be healing well. She had some mild spondylitic changes above at the 5 level but this did not require surgical treatment.  Follow-Up Instructions: No Follow-up on file.   Orders:  No orders of the defined types were placed in this encounter.  No orders of the defined types were placed in this encounter.     Procedures: No procedures performed   Clinical Data: No additional findings.   Subjective: Chief Complaint  Patient presents with  . Neck - Pain    HPI patient is here concerning acute neck pain. She had her phone talked to her on the left stocking had something in her left hand she is holding certainly felt sharp pain in her neck since that time she's had pain where she holds her neck slightly tilted and rotated 10 to the left. She's been trying to keep her head exactly straight but says that this is uncomfortable and gives her pain. She states she is getting tingling in her hands again and thinks she may have carpal tunnel coming back from his carpal tunnel releases years ago. She was recently put on prednisone by an ENT doctor for some allergy problems. She saw was recent vein specialist for busted vein in her hand Dr. Adele Barthel. She denies fever or chills. States sometimes her legs feel weak and feels like she doesn't have much strength. She brought with her  numerous previous fluoroscopic pictures as well as copies of previous x-rays had been done. She is taking in keeping a file well her medical history. Patient had problems with the pulmonary embolism in the postoperative time period after cervical fusion. She has been on Eliquis.  Review of Systems review of systems updated positive for hypertension asthma sinusitis, recent prednisone treatment., GERD, polycystic ovary syndrome, hyperlipidemia, chronic headaches. 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 Two-level cervical fusion 11/13/2016. Recent acute neck injury while holding the phone to her ear with her shoulder. History of microcytic anemia, tachycardia dysfunctional uterine bleeding and anxiety.   Objective: Vital Signs: BP 118/82   Pulse (!) 103   Ht 5\' 2"  (1.575 m)   Wt 140 lb (63.5 kg)   BMI 25.61 kg/m   Physical Exam  Constitutional: She is oriented to person, place, and time. She appears well-developed.  HENT:  Head: Normocephalic.  Right Ear: External ear normal.  Left Ear: External ear normal.  Eyes: Pupils are equal, round, and reactive to light.  Neck: No tracheal deviation present. No thyromegaly present.  Cardiovascular: Normal rate.   Pulmonary/Chest: Effort normal.  Abdominal: Soft.  Musculoskeletal:  Patient has limitation of rotation of her neck to  the right complains of pain when she reaches sideways to grab and show me some fluoroscopic picture she brought from her follow were previously taken she turned her neck 60 to 70 and rotation and then returns at the previous position. Reflexes are intact old healed tunnel incision no thenar atrophy. Normal heel-to-toe gait. No pitting edema lower extremities. No rash or exposed skin.  Neurological: She is alert and oriented to person, place, and time.  Skin: Skin is warm and dry.  Psychiatric: She has a normal mood and affect. Her behavior is normal.    Ortho Exam reflexes are 3+  and symmetrical. Good biceps triceps wrist forearm supination pronation without atrophy.  Specialty Comments:  No specialty comments available.  Imaging: No results found.   PMFS History: Patient Active Problem List   Diagnosis Date Noted  . S/P cervical spinal fusion 02/27/2017  . Pulmonary embolism ([place]) 11/24/2016  . Dysfunctional uterine bleeding 11/24/2016  . Anticoagulated 11/24/2016  . Microcytic anemia 11/24/2016  . Tachycardia   . Vaginal bleeding   . Allergic rhinoconjunctivitis 07/28/2015  . Chronic headache 07/28/2015  . PCOS (polycystic ovarian syndrome) 06/20/2015  . 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 Hyperlipidemia LDL goal <100 06/20/2015  . GAD (generalized anxiety disorder) 05/18/2014  . GERD (gastroesophageal reflux disease) 05/18/2014  . Body fluid retention 07/07/2013  . Chronic sinusitis 07/02/2013  . Extrinsic asthma 05/20/2013  . HTN (hypertension) 05/20/2013  . Prediabetes 05/20/2013   Past Medical History:  Diagnosis Date  . Anemia   . Anxiety   . Arthritis   . Asthma   . Bilateral calcaneal spurs   . Bilateral polycystic ovarian syndrome   . Bulging lumbar disc   . Carpal tunnel syndrome, bilateral   . COPD (chronic obstructive pulmonary disease) (HCC)    BRONCHITIS  . Diabetes mellitus without complication ([place])    TYPE 2   DX  4-5 YRS AGO  . Fibroids    uterine  . GAD (generalized anxiety disorder)   . GERD (gastroesophageal reflux disease)    TAKES PRESCRIPTION MEDS  . Glaucoma    BOTH EYES  . Hematoma    Hematoma of Left Hand- had to wear a split  . Hyperlipidemia   . Hypertension   . Hypothyroidism    NODULES ON THYROID  . PONV (postoperative nausea and vomiting)   . Pulmonary embolism ([place])   . Tachycardia     Family History  Problem Relation Age of Onset  . Hypertension Mother   . Hypertension Father     Past Surgical History:  Procedure Laterality Date  . ANTERIOR CERVICAL DECOMP/DISCECTOMY FUSION N/A  11/13/2016   Procedure: Cervical five-six, Cervical six-seven Anterior Cervical Discectomy and Fusion, Allograft, Plate;  Surgeon: Marybelle Killings, MD;  Location: [place];  Service: Orthopedics;  Laterality: N/A;  . BREAST SURGERY    . CARPAL TUNNEL RELEASE    . DILATION AND CURETTAGE OF UTERUS    . EXPLORATORY LAPAROTOMY    . NASAL SINUS SURGERY  07/22/13   Dr. Redmond Pulling in [place] History  . Unemployed    Social History Main Topics  . Smoking status: Former Smoker    Packs/day: 0.25    Years: 5.00    Types: Cigarettes    Quit date: 11/18/1989  . Smokeless tobacco: Never Used  . Alcohol use Yes     Comment: occasional  . Drug use: No  . Sexual activity: Not on file

## 2017-03-03 ENCOUNTER — Other Ambulatory Visit: Payer: Self-pay | Admitting: Pediatrics

## 2017-03-03 ENCOUNTER — Telehealth: Payer: Self-pay | Admitting: Pediatrics

## 2017-03-03 NOTE — Telephone Encounter (Signed)
Patient called requesting refill on Hydroxyzine.

## 2017-03-03 NOTE — Telephone Encounter (Signed)
This has already been done.

## 2017-03-04 NOTE — Telephone Encounter (Signed)
Patient aware that Rx has been sent to pharmacy 

## 2017-03-06 ENCOUNTER — Ambulatory Visit (INDEPENDENT_AMBULATORY_CARE_PROVIDER_SITE_OTHER): Payer: BLUE CROSS/BLUE SHIELD | Admitting: Orthopaedic Surgery

## 2017-03-07 ENCOUNTER — Ambulatory Visit (HOSPITAL_COMMUNITY)
Admission: RE | Admit: 2017-03-07 | Discharge: 2017-03-07 | Disposition: A | Discharge status: Home / Self Care | Payer: BLUE CROSS/BLUE SHIELD | Source: Ambulatory Visit | Attending: Vascular Surgery | Admitting: Vascular Surgery

## 2017-03-07 ENCOUNTER — Ambulatory Visit (INDEPENDENT_AMBULATORY_CARE_PROVIDER_SITE_OTHER): Payer: BLUE CROSS/BLUE SHIELD | Admitting: Vascular Surgery

## 2017-03-07 ENCOUNTER — Encounter: Payer: Self-pay | Admitting: Vascular Surgery

## 2017-03-07 VITALS — BP 130/86 | HR 84 | Temp 97.7°F | Resp 18 | Ht 62.0 in | Wt 142.2 lb

## 2017-03-07 DIAGNOSIS — M79642 Pain in left hand: Secondary | ICD-10-CM | POA: Diagnosis not present

## 2017-03-07 DIAGNOSIS — R2242 Localized swelling, mass and lump, left lower limb: Secondary | ICD-10-CM

## 2017-03-07 DIAGNOSIS — I82612 Acute embolism and thrombosis of superficial veins of left upper extremity: Secondary | ICD-10-CM | POA: Diagnosis not present

## 2017-03-07 DIAGNOSIS — M7989 Other specified soft tissue disorders: Secondary | ICD-10-CM

## 2017-03-07 NOTE — Progress Notes (Signed)
Referred by:  Chevis Pretty, [place], [place] [address]   Reason for referral: Left hand swelling    History of Present Illness   Jennifer Mullins is a 46 y.o. (08/11/1971) female who presents with cc: h/o left hand swelling at IV while recently admitted for PE.  The patient was told she likely had infilitration at an IV side which result in dorsal hand swelling and pain at the insertion site.  This had resolved when recently she had recurrent of hand swelling and mild pain.  Additionally, the patient mentions a left anterior shin mass that has been slowly growing over the last few years.  The patient also describe vague muscular aches in her bilateral thighs and calves, initially felt related to statin use.  Past Medical History:  Diagnosis Date  . Anemia   . Anxiety   . Arthritis   . Asthma   . Bilateral calcaneal spurs   . Bilateral polycystic ovarian syndrome   . Bulging lumbar disc   . Carpal tunnel syndrome, bilateral   . COPD (chronic obstructive pulmonary disease) (HCC)    BRONCHITIS  . 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 Fibroids    uterine  . GAD (generalized anxiety disorder)   . GERD (gastroesophageal reflux disease)    TAKES PRESCRIPTION MEDS  . Glaucoma    BOTH EYES  . Hematoma    Hematoma of Left Hand- had to wear a split  . Hyperlipidemia   . Hypertension   . Hypothyroidism    NODULES ON THYROID  . PONV (postoperative nausea and vomiting)   . Pulmonary embolism ([place])   . Tachycardia     Past Surgical History:  Procedure Laterality Date  . ANTERIOR CERVICAL DECOMP/DISCECTOMY FUSION N/A 11/13/2016   Procedure: Cervical five-six, Cervical six-seven Anterior Cervical Discectomy and Fusion, Allograft, Plate;  Surgeon: Marybelle Killings, MD;  Location: [facility];  Service: Orthopedics;  Laterality: N/A;  . BREAST SURGERY    . CARPAL TUNNEL RELEASE    . DILATION AND CURETTAGE OF UTERUS    .  EXPLORATORY LAPAROTOMY    . NASAL SINUS SURGERY  07/22/13   Dr. Redmond Pulling in [place] History  . Marital status: Single    Spouse name: N/A  . Number of children: N/A  . Years of education: N/A   Occupational History  . Unemployed    Social History Main Topics  . Smoking status: Former Smoker    Packs/day: 0.25    Years: 5.00    Types: Cigarettes    Quit date: 11/18/1989  . Smokeless tobacco: Never Used  . Alcohol use Yes     Comment: occasional  . Drug use: No  . Sexual activity: Not on file   Other Topics Concern  . Not on file   Social History Narrative  . No narrative on file    Family History  Problem Relation Age of Onset  . Hypertension Mother   . Hypertension Father     Current Outpatient Prescriptions  Medication Sig Dispense Refill  . acetaminophen-codeine (TYLENOL #3) 300-30 MG tablet Take 1 tablet by mouth every 12 (twelve) hours as needed for moderate pain. 60 tablet 0  . albuterol (PROVENTIL) (2.5 MG/3ML) 0.083% nebulizer solution Take 2.5 mg by nebulization every 6 (six) hours as needed for wheezing or shortness of breath.    Marland Kitchen  ALPRAZolam (XANAX) 0.5 MG tablet Take 2 tablets (1 mg total) by mouth 3 (three) times daily as needed for anxiety. 90 tablet 1  . azelastine (OPTIVAR) 0.05 % ophthalmic solution Place 1 drop into both eyes 2 (two) times daily.   2  . benzonatate (TESSALON) 200 MG capsule   0  . carboxymethylcellulose (REFRESH PLUS) 0.5 % SOLN Place 1 drop into both eyes 3 (three) times daily as needed (dryness).     . citalopram (CELEXA) 20 MG tablet Take 1 tablet (20 mg total) by mouth daily. 30 tablet 1  . cyproheptadine (PERIACTIN) 4 MG tablet Take 1 tablet (4 mg total) by mouth at bedtime. 30 tablet 5  . ELIQUIS 5 MG TABS tablet TAKE 1 TABLET BY MOUTH TWICE DAILY 60 tablet 5  . famotidine (PEPCID) 20 MG tablet Take 1 tablet (20 mg total) by mouth at bedtime. 30 tablet 6  . ferrous sulfate 325 (65 FE) MG tablet Take 1  tablet (325 mg total) by mouth 2 (two) times daily with a meal. 60 tablet 3  . fluticasone (FLONASE) 50 MCG/ACT nasal spray Place 2 sprays into both nostrils daily.    . furosemide (LASIX) 20 MG tablet Take 1 tablet (20 mg total) by mouth daily. 30 tablet 5  . hydrOXYzine (ATARAX/VISTARIL) 50 MG tablet TAKE 1 TABLET BY MOUTH THREE TIMES DAILY AS NEEDED FOR ITCHING. 30 tablet 4  . ipratropium (ATROVENT) 0.03 % nasal spray Place 2 sprays into both nostrils every 12 (twelve) hours. 30 mL 5  . latanoprost (XALATAN) 0.005 % ophthalmic solution Place 1 drop into both eyes at bedtime.   98  . losartan (COZAAR) 50 MG tablet Take 1 tablet (50 mg total) by mouth daily. 30 tablet 2  . metFORMIN (GLUCOPHAGE) 500 MG tablet Take 1 tablet (500 mg total) by mouth 2 (two) times daily with a meal. (Patient taking differently: Take 500 mg by mouth 2 (two) times daily as needed (depending on sugar). ) 60 tablet 5  . montelukast (SINGULAIR) 10 MG tablet TAKE 1 TABLET BY MOUTH AT BEDTIME 30 tablet 5  . Olopatadine HCl 0.6 % SOLN Place 1 drop (1 puff total) into the nose every morning. (Patient taking differently: Place 2 puffs into the nose 2 (two) times daily. ) 30.5 g 3  . pantoprazole (PROTONIX) 40 MG tablet Take 1 tablet (40 mg total) by mouth daily. 30 tablet 5  . polyethylene glycol (MIRALAX / GLYCOLAX) packet Take 17 g by mouth daily as needed for mild constipation. 14 each 0  . pravastatin (PRAVACHOL) 20 MG tablet Take 1 tablet (20 mg total) by mouth daily. 30 tablet 2  . PROAIR HFA 108 (90 Base) MCG/ACT inhaler INHALE 1-2 PUFFS EVERY FOUR HOURS AS NEEDED WHEEZING SHORTNESS OF BREATH 8.5 g 5  . SYMBICORT 160-4.5 MCG/ACT inhaler INHALE 2 PUFFS BY MOUTH FIRST THING IN THE MORNING AND THEN 2 PUFFS BY MOUTH 12 HOURS LATER 10.2 g 5   No current facility-administered medications for this visit.     Allergies  Allergen Reactions  . Demerol [Meperidine] Shortness Of Breath    FLUSHING AND SHORTNESS OF BREATH  .  Morphine And Related Shortness Of Breath    Flushed and hot hyper  . Diltiazem Hcl Hives  . Lisinopril     Caused cough     REVIEW OF SYSTEMS:   Cardiac:  positive for: DOE, intermittent cardiac arrhythmia, negative for: Chest pain or chest pressure and Shortness of breath when lying flat,  Vascular:  positive for: no symptoms,  negative for: Pain in calf, thigh, or hip brought on by ambulation, Pain in feet at night that wakes you up from your sleep, Blood clot in your veins and Leg swelling  Pulmonary:  positive for: PE,  negative for: Oxygen at home, Productive cough and Wheezing  Neurologic:  positive for: Sudden weakness in arms or legs, negative for: Sudden numbness in arms or legs, Sudden onset of difficulty speaking or slurred speech, Temporary loss of vision in one eye and Problems with dizziness  Gastrointestinal:  positive for: no symptoms, negative for: Blood in stool and Vomited blood  Genitourinary:  positive for: no symptoms, negative for: Burning when urinating and Blood in urine  Psychiatric:  positive for: anxiety,  negative for: Major depression  Hematologic:  positive for: no symptoms,  negative for: negative for: Bleeding problems and Problems with blood clotting too easily  Dermatologic:  positive for: abnormal skin lesions, negative for: Rashes or ulcers and abnormal skin lesions  Constitutional:  positive for: no symptoms, negative for: Fever or chills  Ear/Nose/Throat:  positive for: no symptoms, negative for: Change in hearing, Nose bleeds and Sore throat  Musculoskeletal:  positive for: no symptoms, negative for: Back pain, Joint pain and Muscle pain   For VQI Use Only   PRE-ADM LIVING Home  AMB STATUS Ambulatory  CAD Sx None  PRIOR CHF None  STRESS TEST No    Physical Examination   Vitals:   03/07/17 0937  BP: 130/86  Pulse: 84  Resp: 18  Temp: 97.7 F (36.5 C)  TempSrc: Oral  SpO2: 100%  Weight: 142 lb 3.2 oz  (64.5 kg)  Height: 5\' 2"  (1.575 m)    Body mass index is 26.01 kg/m.  General Alert, O x 3, WD, NAD  Head [place]/AT,    Ear/Nose/Throat Hearing grossly intact, nares without erythema or drainage, oropharynx without Erythema or Exudate, Mallampati score: 3, Dentition intact  Eyes PERRLA, EOMI,    Neck Supple, mid-line trachea,    Pulmonary Sym exp, good B air movt, CTA B  Cardiac RRR, Nl S1, S2, no Murmurs, No rubs, No S3,S4  Vascular Vessel Right Left  Radial Palpable Palpable  Brachial Palpable Palpable  Carotid Palpable, No Bruit Palpable, No Bruit  Aorta Not palpable N/A  Femoral Palpable Palpable  Popliteal Not palpable Not palpable  PT Palpable Palpable  DP Palpable Palpable    Gastrointestinal soft, non-distended, non-tender to palpation, No guarding or rebound, no HSM, no masses, no CVAT B, No palpable prominent aortic pulse,    Musculoskeletal M/S 5/5 throughout  , Extremities without ischemic changes  , No edema present, No obvious varicosities , No Lipodermatosclerosis present, no residual cord in L hand, mild skin scarring c/w prior IV in antecubitum, somewhat rubbery mass in mid left shin with solid core  Neurologic Cranial nerves 2-12 intact , Pain and light touch intact in extremities , Motor exam as listed above  Psychiatric Judgement intact, Mood & affect appropriate for pt's clinical situation  Dermatologic See M/S exam for extremity exam, No rashes otherwise noted  Lymphatic  Palpable lymph nodes: None    Non-Invasive Vascular Imaging   LUE Venous duplex (03/07/2017 ): no SVT or DVT   Outside Studies/Documentation   10 pages of outside documents were reviewed including: recent admission records.   Medical Decision Making   EMARA LICHTER is a 46 y.o. female who presents with: likely resolved L SVT, L leg mass  LUE venous exam demonstrates resolution of L dorsal hand SVT, likely with some element of infilitration.  No doubt the therapeutic anticoagulation  helped facility this process.  There is no evidence of residual SVT vs DVT.  Additionally, would refer the patient to Ortho for re-evaluation of her L shin mass.  She has been told previously this was merely a cyst, but I can definitely feel a solid core to this mass, so further get evaluation including LLE MRI and possible excisional biopsy may be needed to rule out a sarcoma.  Thank you for allowing [place] to participate in this patient's care.   Adele Barthel, MD, FACS [facility] Office: [phone] Pager: [phone]  03/07/2017, 9:47 AM

## 2017-03-10 DIAGNOSIS — H04123 Dry eye syndrome of bilateral lacrimal glands: Secondary | ICD-10-CM | POA: Diagnosis not present

## 2017-03-11 ENCOUNTER — Telehealth: Payer: Self-pay

## 2017-03-11 DIAGNOSIS — M722 Plantar fascial fibromatosis: Secondary | ICD-10-CM | POA: Diagnosis not present

## 2017-03-11 DIAGNOSIS — M79671 Pain in right foot: Secondary | ICD-10-CM | POA: Diagnosis not present

## 2017-03-11 NOTE — Telephone Encounter (Signed)
Phone call from pt.  Reported that she was seen by Dr. Bridgett Larsson 4/20 for problem with left hand.  Related hx. of having had lab draw and IV in left hand, and of 2 episodes that the vein in left hand "blew out."  Reported she didn't feel that the problem with her left hand was addressed while in office on Fri., 4/20.  Reported she continues to have aching in left hand at times, and especially when she bends the hand back.  Stated "the PA that saw me in the [facility] on 2/24, said I have a weakness in the blood vessel of left hand and that I needed to see a Vasc. Surgeon, to have it evaluated.  Advised the pt. that since she experienced the IV and venipuncture of the left hand, the blood vessel will need to heal; also advised that with Eliquis on board, she will have a tendency to bruise easily.  Encouraged her to be mindful to protect the left hand from bumping against objects.  Also encouraged her to inform any medical personnel of need to avoid IV's and lab draws from left hand, due to recent hx. of infiltration.  Requested to inform Dr. Bridgett Larsson of her continued left hand aching and of  concern for need of any further evaluation.  Advised will send note to Dr. Bridgett Larsson, and call back with any recommendations.  Agreed.

## 2017-03-12 ENCOUNTER — Ambulatory Visit (INDEPENDENT_AMBULATORY_CARE_PROVIDER_SITE_OTHER): Payer: BLUE CROSS/BLUE SHIELD | Admitting: Allergy and Immunology

## 2017-03-12 ENCOUNTER — Other Ambulatory Visit: Payer: Self-pay | Admitting: *Deleted

## 2017-03-12 ENCOUNTER — Encounter: Payer: Self-pay | Admitting: Allergy and Immunology

## 2017-03-12 ENCOUNTER — Other Ambulatory Visit: Payer: Self-pay | Admitting: Pediatrics

## 2017-03-12 ENCOUNTER — Other Ambulatory Visit: Payer: Self-pay

## 2017-03-12 VITALS — BP 110/70 | HR 98 | Temp 98.3°F | Resp 20 | Ht 62.0 in | Wt 143.0 lb

## 2017-03-12 DIAGNOSIS — R609 Edema, unspecified: Secondary | ICD-10-CM

## 2017-03-12 DIAGNOSIS — H6982 Other specified disorders of Eustachian tube, left ear: Secondary | ICD-10-CM

## 2017-03-12 DIAGNOSIS — J3089 Other allergic rhinitis: Secondary | ICD-10-CM | POA: Diagnosis not present

## 2017-03-12 DIAGNOSIS — H1045 Other chronic allergic conjunctivitis: Secondary | ICD-10-CM | POA: Diagnosis not present

## 2017-03-12 DIAGNOSIS — J4540 Moderate persistent asthma, uncomplicated: Secondary | ICD-10-CM

## 2017-03-12 DIAGNOSIS — R519 Headache, unspecified: Secondary | ICD-10-CM

## 2017-03-12 DIAGNOSIS — K219 Gastro-esophageal reflux disease without esophagitis: Secondary | ICD-10-CM

## 2017-03-12 DIAGNOSIS — I10 Essential (primary) hypertension: Secondary | ICD-10-CM

## 2017-03-12 DIAGNOSIS — E282 Polycystic ovarian syndrome: Secondary | ICD-10-CM

## 2017-03-12 DIAGNOSIS — R51 Headache: Secondary | ICD-10-CM

## 2017-03-12 DIAGNOSIS — J4522 Mild intermittent asthma with status asthmaticus: Secondary | ICD-10-CM

## 2017-03-12 DIAGNOSIS — H1010 Acute atopic conjunctivitis, unspecified eye: Secondary | ICD-10-CM

## 2017-03-12 MED ORDER — FUROSEMIDE 20 MG PO TABS: 20.0000 mg | ORAL_TABLET | Freq: Every day | ORAL | 5 refills | Status: DC

## 2017-03-12 MED ORDER — MONTELUKAST SODIUM 10 MG PO TABS: 10.0000 mg | ORAL_TABLET | Freq: Every day | ORAL | 5 refills | Status: DC

## 2017-03-12 MED ORDER — FAMOTIDINE 20 MG PO TABS: 20.0000 mg | ORAL_TABLET | Freq: Every day | ORAL | 6 refills | Status: DC

## 2017-03-12 MED ORDER — METFORMIN HCL 500 MG PO TABS: 500.0000 mg | ORAL_TABLET | Freq: Two times a day (BID) | ORAL | 2 refills | Status: DC

## 2017-03-12 MED ORDER — APIXABAN 5 MG PO TABS
5.0000 mg | ORAL_TABLET | Freq: Two times a day (BID) | ORAL | 6 refills | Status: DC
Start: 2017-03-12 — End: 2017-05-28

## 2017-03-12 MED ORDER — OLOPATADINE HCL 0.1 % OP SOLN: 1.0000 [drp] | Freq: Two times a day (BID) | OPHTHALMIC | 5 refills | Status: DC

## 2017-03-12 NOTE — Progress Notes (Signed)
Follow-up Note  Referring Provider: Eustaquio Maize, MD Primary Provider: Eustaquio Maize, MD Date of Office Visit: 03/12/2017  Subjective:   Jennifer Mullins (DOB: 04-06-1971) is a 46 y.o. female who returns to the [facility] on 03/12/2017 in re-evaluation of the following:  HPI: Jakari returns to this clinic in reevaluation of her multiorgan atopic disease manifested his asthma and allergic rhinoconjunctivitis and a history of chronic sinusitis and chronic cephalgia and ETD and reflux. I last saw her in this clinic 02/04/2017.  Since the pollen has arrived over the course of the past 3 weeks she has developed very significant problems with itchy red mucus producing eyes requiring her to go visit with her ophthalmologist on 2 occasions and be treated with multiple eyedrops which have not resulted in adequate control of her issue. She has apparently had Bepreve, Lotomax, Azelastine, and Pazeo prescribed.  Her nose has also been a little bit stuffy but not particularly bad. Her ears definitely have filled out once again. She did visit with Dr. Thornell Mule regarding her ear issue and was diagnosed with ETD and given some prednisone which resulted in resolution of this issue for a period in time.  Her asthma has been under pretty good control and she rarely uses a short acting bronchodilator at this point.  Allergies as of 03/12/2017      Reactions   Demerol [meperidine] Shortness Of Breath   FLUSHING AND SHORTNESS OF BREATH   Morphine And Related Shortness Of Breath   Flushed and hot hyper   Diltiazem Hcl Hives   Lisinopril    Caused cough      Medication List      acetaminophen-codeine 300-30 MG tablet Commonly known as:  TYLENOL #3 Take 1 tablet by mouth every 12 (twelve) hours as needed for moderate pain.   ALPRAZolam 0.5 MG tablet Commonly known as:  XANAX   azelastine 0.05 % ophthalmic solution Commonly known as:  OPTIVAR Place 1 drop into both eyes 2 (two)  times daily.   carboxymethylcellulose 0.5 % Soln Commonly known as:  REFRESH PLUS Place 1 drop into both eyes 3 (three) times daily as needed (dryness).   citalopram 20 MG tablet Commonly known as:  CELEXA Take 1 tablet (20 mg total) by mouth daily.   cyproheptadine 4 MG tablet Commonly known as:  PERIACTIN Take 1 tablet (4 mg total) by mouth at bedtime.   ELIQUIS 5 MG Tabs tablet Generic drug:  apixaban TAKE 1 TABLET BY MOUTH TWICE DAILY   famotidine 20 MG tablet Commonly known as:  PEPCID Take 1 tablet (20 mg total) by mouth at bedtime.   ferrous sulfate 325 (65 FE) MG tablet Take 1 tablet (325 mg total) by mouth 2 (two) times daily with a meal.   fluticasone 50 MCG/ACT nasal spray Commonly known as:  FLONASE Place 2 sprays into both nostrils daily.   furosemide 20 MG tablet Commonly known as:  LASIX Take 1 tablet (20 mg total) by mouth daily.   hydrOXYzine 50 MG tablet Commonly known as:  ATARAX/VISTARIL TAKE 1 TABLET BY MOUTH THREE TIMES DAILY AS NEEDED FOR ITCHING.   ipratropium 0.03 % nasal spray Commonly known as:  ATROVENT Place 2 sprays into both nostrils every 12 (twelve) hours.   latanoprost 0.005 % ophthalmic solution Commonly known as:  XALATAN Place 1 drop into both eyes at bedtime.   losartan 50 MG tablet Commonly known as:  COZAAR Take 1 tablet (50 mg  total) by mouth daily.   metFORMIN 500 MG tablet Commonly known as:  GLUCOPHAGE Take 1 tablet (500 mg total) by mouth 2 (two) times daily with a meal.   metroNIDAZOLE 250 MG tablet Commonly known as:  FLAGYL   montelukast 10 MG tablet Commonly known as:  SINGULAIR TAKE 1 TABLET BY MOUTH AT BEDTIME   Olopatadine HCl 0.6 % Soln Place 1 drop (1 puff total) into the nose every morning.   pantoprazole 40 MG tablet Commonly known as:  PROTONIX Take 1 tablet (40 mg total) by mouth daily.   polyethylene glycol packet Commonly known as:  MIRALAX / GLYCOLAX Take 17 g by mouth daily as needed for  mild constipation.   pravastatin 20 MG tablet Commonly known as:  PRAVACHOL Take 1 tablet (20 mg total) by mouth daily.   albuterol (2.5 MG/3ML) 0.083% nebulizer solution Commonly known as:  PROVENTIL Take 2.5 mg by nebulization every 6 (six) hours as needed for wheezing or shortness of breath.   PROAIR HFA 108 (90 Base) MCG/ACT inhaler Generic drug:  albuterol INHALE 1-2 PUFFS EVERY FOUR HOURS AS NEEDED WHEEZING SHORTNESS OF BREATH   SYMBICORT 160-4.5 MCG/ACT inhaler Generic drug:  budesonide-formoterol INHALE 2 PUFFS BY MOUTH FIRST THING IN THE MORNING AND THEN 2 PUFFS BY MOUTH 12 HOURS LATER       Past Medical History:  Diagnosis Date  . Anemia   . Anxiety   . Arthritis   . Asthma   . Bilateral calcaneal spurs   . Bilateral polycystic ovarian syndrome   . Bulging lumbar disc   . Carpal tunnel syndrome, bilateral   . COPD (chronic obstructive pulmonary disease) (HCC)    BRONCHITIS  . Diabetes mellitus without complication ([place])    TYPE 2   DX  4-5 YRS AGO  . Fibroids    uterine  . GAD (generalized anxiety disorder)   . GERD (gastroesophageal reflux disease)    TAKES PRESCRIPTION MEDS  . Glaucoma    BOTH EYES  . Hematoma    Hematoma of Left Hand- had to wear a split  . Hyperlipidemia   . Hypertension   . Hypothyroidism    NODULES ON THYROID  . PONV (postoperative nausea and vomiting)   . Pulmonary embolism ([place])   . Tachycardia     Past Surgical History:  Procedure Laterality Date  . ANTERIOR CERVICAL DECOMP/DISCECTOMY FUSION N/A 11/13/2016   Procedure: Cervical five-six, Cervical six-seven Anterior Cervical Discectomy and Fusion, Allograft, Plate;  Surgeon: Marybelle Killings, MD;  Location: [facility];  Service: Orthopedics;  Laterality: N/A;  . BREAST SURGERY    . CARPAL TUNNEL RELEASE    . DILATION AND CURETTAGE OF UTERUS    . EXPLORATORY LAPAROTOMY    . NASAL SINUS SURGERY  07/22/13   Dr. Redmond Pulling in [place]    Review of systems negative except as noted in HPI /  PMHx or noted below:  Review of Systems  Constitutional: Negative.   HENT: Negative.   Eyes: Negative.   Respiratory: Negative.   Cardiovascular: Negative.   Gastrointestinal: Negative.   Genitourinary: Negative.   Musculoskeletal: Negative.   Skin: Negative.   Neurological: Negative.   Endo/Heme/Allergies: Negative.   Psychiatric/Behavioral: Negative.      Objective:   Vitals:   03/12/17 1016  BP: 110/70  Pulse: 98  Resp: 20  Temp: 98.3 F (36.8 C)   Height: 5\' 2"  (157.5 cm)  Weight: 143 lb (64.9 kg)   Physical Exam  Constitutional: She  is well-developed, well-nourished, and in no distress.  Allergic shiners  HENT:  Head: Normocephalic.  Right Ear: External ear and ear canal normal. No middle ear effusion (absence of pneumatic movement).  Left Ear: External ear and ear canal normal.  No middle ear effusion (absence of pneumatic movement).  Nose: Mucosal edema present. No rhinorrhea.  Mouth/Throat: Uvula is midline, oropharynx is clear and moist and mucous membranes are normal. No oropharyngeal exudate.  Eyes: Right conjunctiva is injected. Left conjunctiva is injected.  Neck: Trachea normal. No tracheal tenderness present. No tracheal deviation present. No thyromegaly present.  Cardiovascular: Normal rate, regular rhythm, S1 normal, S2 normal and normal heart sounds.   No murmur heard. Pulmonary/Chest: Breath sounds normal. No stridor. No respiratory distress. She has no wheezes. She has no rales.  Musculoskeletal: She exhibits no edema.  Lymphadenopathy:       Head (right side): No tonsillar adenopathy present.       Head (left side): No tonsillar adenopathy present.    She has no cervical adenopathy.  Neurological: She is alert. Gait normal.  Skin: No rash noted. She is not diaphoretic. No erythema. Nails show no clubbing.  Psychiatric: Mood and affect normal.    Diagnostics:    Spirometry was performed and demonstrated an FEV1 of 1.65 at 78 % of predicted.    Assessment and Plan:   1. Asthma, moderate persistent, well-controlled   2. Other allergic rhinitis   3. Seasonal allergic conjunctivitis   4. Headache disorder   5. LPRD (laryngopharyngeal reflux disease)   6. ETD (Eustachian tube dysfunction), left     1. Prednisone 10mg  one tablet one time per day for 10 days only. Check bloodsugars. NO MORE STEROIDS AFTER THIS COURSE OF PREDNISONE.  2. Continue to Treat and prevent inflammation:   A. Symbicort 160 - 2 inhalations twice a day  C. Flonase - one spray each nostril twice a day  D. montelukast - one tablet one time per day  3. Continue to Treat and prevent reflux:   A. pantoprazole 40 mg in a.m.  B. famotidine 20 mg in PM  4. Treat and prevent headaches:   A. continue cyproheptadine 4 mg tablet at bedtime  5. If needed:   A. Patanase 2 sprays each nostril two times per day  B. OTC antihistamine  C. Proventil HFA or similar 2 inhalations every 4-6 hours  D. Patanol - one drop each eye two times per day  6. Consider a course of immunotherapy  7. Order a pair of transition lens sports glasses to wear outdoors  8. Return to clinic in Summer 2018 or earlier if problem  [name] really needs to consider starting a course of immunotherapy to treat her multiorgan atopic disease and I had a discussion with her today about this issue today. For now she will once again use a systemic steroid as well as other medications directed against inflammation of her upper airway and lower airway and eyes. She will also continue on therapy for her LPR and for her chronic cephalgia as noted above. I will see her back in this clinic in the summer of 2018 or earlier if there is a problem.  Allena Katz, MD Allergy / Immunology [place]

## 2017-03-12 NOTE — Patient Instructions (Addendum)
  1. Prednisone 10mg  one tablet one time per day for 10 days only. Check bloodsugars. NO MORE STEROIDS AFTER THIS COURSE OF PREDNISONE.  2. Continue to Treat and prevent inflammation:   A. Symbicort 160 - 2 inhalations twice a day  C. Flonase - one spray each nostril twice a day  D. montelukast - one tablet one time per day  3. Continue to Treat and prevent reflux:   A. pantoprazole 40 mg in a.m.  B. famotidine 20 mg in PM  4. Treat and prevent headaches:   A. continue cyproheptadine 4 mg tablet at bedtime  5. If needed:   A. Patanase 2 sprays each nostril two times per day  B. OTC antihistamine  C. Proventil HFA or similar 2 inhalations every 4-6 hours  D. Patanol - one drop each eye two times per day  6. Consider a course of immunotherapy  7. Order a pair of transition lens sports glasses to wear outdoors  8. Return to clinic in Summer 2018 or earlier if problem

## 2017-03-13 NOTE — Telephone Encounter (Signed)
RE: to report pt. concerns  Received: 2 days ago  [place], MD  Joline Salt Pullins, RN        She has no problems that we would surgically address. I would defer further management to her PCP.    Pt. Was notified of Dr. Lianne Moris recommendation.  Verb. Understanding.

## 2017-03-19 ENCOUNTER — Other Ambulatory Visit: Payer: Self-pay | Admitting: Pediatrics

## 2017-03-20 DIAGNOSIS — H10413 Chronic giant papillary conjunctivitis, bilateral: Secondary | ICD-10-CM | POA: Diagnosis not present

## 2017-03-20 DIAGNOSIS — H6983 Other specified disorders of Eustachian tube, bilateral: Secondary | ICD-10-CM | POA: Diagnosis not present

## 2017-03-20 DIAGNOSIS — R05 Cough: Secondary | ICD-10-CM | POA: Diagnosis not present

## 2017-03-20 DIAGNOSIS — H1013 Acute atopic conjunctivitis, bilateral: Secondary | ICD-10-CM | POA: Diagnosis not present

## 2017-03-20 NOTE — Telephone Encounter (Signed)
Rx called in 

## 2017-03-21 ENCOUNTER — Ambulatory Visit (INDEPENDENT_AMBULATORY_CARE_PROVIDER_SITE_OTHER): Payer: BLUE CROSS/BLUE SHIELD | Admitting: Pediatrics

## 2017-03-21 ENCOUNTER — Telehealth: Payer: Self-pay | Admitting: Pediatrics

## 2017-03-21 ENCOUNTER — Encounter: Payer: Self-pay | Admitting: Pediatrics

## 2017-03-21 VITALS — BP 125/83 | HR 85 | Temp 98.2°F | Ht 62.0 in | Wt 140.8 lb

## 2017-03-21 DIAGNOSIS — I10 Essential (primary) hypertension: Secondary | ICD-10-CM

## 2017-03-21 DIAGNOSIS — M542 Cervicalgia: Secondary | ICD-10-CM

## 2017-03-21 DIAGNOSIS — F411 Generalized anxiety disorder: Secondary | ICD-10-CM | POA: Diagnosis not present

## 2017-03-21 DIAGNOSIS — R2242 Localized swelling, mass and lump, left lower limb: Secondary | ICD-10-CM

## 2017-03-21 DIAGNOSIS — R399 Unspecified symptoms and signs involving the genitourinary system: Secondary | ICD-10-CM

## 2017-03-21 LAB — URINALYSIS, COMPLETE
Bilirubin, UA: NEGATIVE
Glucose, UA: NEGATIVE
Ketones, UA: NEGATIVE
Leukocytes, UA: NEGATIVE
Nitrite, UA: NEGATIVE
Protein, UA: NEGATIVE
RBC, UA: NEGATIVE
Specific Gravity, UA: 1.01 (ref 1.005–1.030)
Urobilinogen, Ur: 0.2 mg/dL (ref 0.2–1.0)
pH, UA: 8 — ABNORMAL HIGH (ref 5.0–7.5)

## 2017-03-21 LAB — MICROSCOPIC EXAMINATION
Epithelial Cells (non renal): >10 /hpf — AB (ref 0–10)
Renal Epithel, UA: NONE SEEN /hpf

## 2017-03-21 MED ORDER — HYDROXYZINE HCL 25 MG PO TABS: 25.0000 mg | ORAL_TABLET | Freq: Three times a day (TID) | ORAL | 1 refills | Status: DC | PRN

## 2017-03-21 MED ORDER — LOSARTAN POTASSIUM 25 MG PO TABS: 25.0000 mg | ORAL_TABLET | Freq: Every day | ORAL | 3 refills | Status: DC

## 2017-03-21 MED ORDER — ALPRAZOLAM 0.5 MG PO TABS: 0.5000 mg | ORAL_TABLET | Freq: Three times a day (TID) | ORAL | 0 refills | Status: DC | PRN

## 2017-03-21 MED ORDER — CITALOPRAM HYDROBROMIDE 10 MG PO TABS: 10.0000 mg | ORAL_TABLET | Freq: Every day | ORAL | 3 refills | Status: DC

## 2017-03-21 MED ORDER — ACETAMINOPHEN-CODEINE #3 300-30 MG PO TABS: 1.0000 | ORAL_TABLET | Freq: Two times a day (BID) | ORAL | 0 refills | Status: DC | PRN

## 2017-03-21 MED ORDER — FLUCONAZOLE 150 MG PO TABS: 150.0000 mg | ORAL_TABLET | Freq: Once | ORAL | 0 refills | Status: DC

## 2017-03-21 NOTE — Progress Notes (Signed)
Subjective:   Patient ID: Jennifer Mullins, female    DOB: 05/15/1971, 46 y.o.   MRN: [id number] CC: Follow-up (2 month) multiple med problems HPI: Jennifer Mullins is a 46 y.o. 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, started on eye drops for treatment  Anxiety: off of celexa for 3 weeks Taking xanax  Taking hydroxyzine, cyproheptadine now for allergies and headaches and worried about being on too many medicines so stopped celexa  HTN: thinks BP is dropping at times on 50mg  of losartan so has been taking half a tab No CP, no SOB  DM2: taking metformin daily  Having some burning with urination, not every time Some vaginal itching Was given dose of metronidazole Is not sure if it has helped  Coughing up clear fluid with black specks in it in the morning Seen by ENT yesterday, wants it tested  Seen by vein specialist, has had mass L shin, now growing rec further ortho evaluation  Relevant past medical, surgical, family and social history reviewed. Allergies and medications reviewed and updated. History  Smoking Status  . Former Smoker  . Packs/day: 0.25  . Years: 5.00  . Types: Cigarettes  . Quit date: 11/18/1989  Smokeless Tobacco  . Never Used   ROS: Per HPI   Objective:    BP 125/83   Pulse 85   Temp 98.2 F (36.8 C) (Oral)   Ht 5\' 2"  (1.575 m)   Wt 140 lb 12.8 oz (63.9 kg)   BMI 25.75 kg/m   Wt Readings from Last 3 Encounters:  03/21/17 140 lb 12.8 oz (63.9 kg)  03/12/17 143 lb (64.9 kg)  03/07/17 142 lb 3.2 oz (64.5 kg)    Gen: NAD, alert, cooperative with exam, NCAT, walking with cane EYES: EOMI, no conjunctival injection, or no icterus CV: NRRR, normal S1/S2, no murmur, distal pulses 2+ b/l Resp: CTABL, no wheezes, normal WOB Abd: +BS, soft, NTND. no guarding or organomegaly Ext: No edema, warm Neuro: Alert and oriented Pscyh: normal affect, well groomed/dressed  Assessment & Plan:    Poet was seen today for follow-up multiple med problems.  Diagnoses and all orders for this visit:  Leg mass, left Growing, had MRI in 2013 Vascular recommended ortho evaluation -     Ambulatory referral to Orthopedic Surgery  Essential hypertension Well ocntrolled, feeling lightheaded at home at times Will decrease to 25mg  losartan daily Check BPs at home, bring to next clinic visit -     losartan (COZAAR) 25 MG tablet; Take 1 tablet (25 mg total) by mouth daily.  UTI symptoms UA unremarkable, f/u culture -     Urinalysis, Complete -     Urine culture  GAD (generalized anxiety disorder) Symptoms improving Significant atopy, on hydroxyzine, taking 50mg  multiple times a day, says it has been causing drowsiness Can try taking 25mg  and only as needed per allergy recs Will ocntinue to wean xanax, take 0.5mg  twice a day, a third time if needed Restart citalopram -     citalopram (CELEXA) 10 MG tablet; Take 1 tablet (10 mg total) by mouth daily. -     ALPRAZolam (XANAX) 0.5 MG tablet; Take 1 tablet (0.5 mg total) by mouth 3 (three) times daily as needed for anxiety. -     hydrOXYzine (ATARAX/VISTARIL) 25 MG tablet; Take 1-2 tablets (25-50 mg total) by mouth 3 (three) times daily as needed.  Neck pain One Rx for below written #30 tabs Goal to  not need further refills -     acetaminophen-codeine (TYLENOL #3) 300-30 MG tablet; Take 1 tablet by mouth every 12 (twelve) hours as needed for moderate pain.  Follow up plan: Return in about 2 months (around 05/21/2017). [name], MD [place]

## 2017-03-21 NOTE — Telephone Encounter (Signed)
Please note

## 2017-03-22 ENCOUNTER — Other Ambulatory Visit: Payer: Self-pay

## 2017-03-22 ENCOUNTER — Other Ambulatory Visit: Payer: BLUE CROSS/BLUE SHIELD

## 2017-03-22 DIAGNOSIS — R059 Cough, unspecified: Secondary | ICD-10-CM

## 2017-03-22 DIAGNOSIS — R05 Cough: Secondary | ICD-10-CM

## 2017-03-22 LAB — URINE CULTURE

## 2017-03-25 ENCOUNTER — Ambulatory Visit (INDEPENDENT_AMBULATORY_CARE_PROVIDER_SITE_OTHER): Payer: BLUE CROSS/BLUE SHIELD | Admitting: Podiatry

## 2017-03-25 ENCOUNTER — Other Ambulatory Visit: Payer: Self-pay | Admitting: Nurse Practitioner

## 2017-03-25 ENCOUNTER — Other Ambulatory Visit: Payer: Self-pay | Admitting: Pediatrics

## 2017-03-25 ENCOUNTER — Encounter: Payer: Self-pay | Admitting: Podiatry

## 2017-03-25 ENCOUNTER — Telehealth: Payer: Self-pay | Admitting: Pediatrics

## 2017-03-25 ENCOUNTER — Ambulatory Visit: Payer: BLUE CROSS/BLUE SHIELD | Admitting: Podiatry

## 2017-03-25 ENCOUNTER — Ambulatory Visit (INDEPENDENT_AMBULATORY_CARE_PROVIDER_SITE_OTHER): Payer: BLUE CROSS/BLUE SHIELD

## 2017-03-25 DIAGNOSIS — M722 Plantar fascial fibromatosis: Secondary | ICD-10-CM

## 2017-03-25 DIAGNOSIS — R05 Cough: Secondary | ICD-10-CM

## 2017-03-25 DIAGNOSIS — M76829 Posterior tibial tendinitis, unspecified leg: Secondary | ICD-10-CM

## 2017-03-25 DIAGNOSIS — R059 Cough, unspecified: Secondary | ICD-10-CM

## 2017-03-25 DIAGNOSIS — K219 Gastro-esophageal reflux disease without esophagitis: Secondary | ICD-10-CM

## 2017-03-25 DIAGNOSIS — J4522 Mild intermittent asthma with status asthmaticus: Secondary | ICD-10-CM

## 2017-03-25 DIAGNOSIS — M2140 Flat foot [pes planus] (acquired), unspecified foot: Secondary | ICD-10-CM

## 2017-03-25 DIAGNOSIS — E785 Hyperlipidemia, unspecified: Secondary | ICD-10-CM

## 2017-03-25 LAB — SPUTUM CYTOLOGY

## 2017-03-25 NOTE — Telephone Encounter (Signed)
Pt aware by detailed VM - of pulm referral and that culture is not back

## 2017-03-25 NOTE — Progress Notes (Addendum)
   Subjective:    Patient ID: Jennifer Mullins, female    DOB: Aug 28, 1971, 46 y.o.   MRN: [id number]  HPI: She presents today requesting orthotics. She states that she has seen Dr. Irving Shows in the past prescription for custom inserts for plantar fasciitis. She states that she has a pair orthotics that seem to help in the past but she no longer has shoes that will accommodate them. She was apparently orthotics to accommodate the shoes that she is wearing. History of diabetes mellitus and cervical neck surgery.She states that Dr. Irving Shows and diagnosed her with heel spurs bilaterally she had physical therapy in both feet 08/26/2009. She's had custom inserts for plantar fasciitis made 09/26/2009. History of plantar fasciitis dispensed night splints as well.    Review of Systems  All other systems reviewed and are negative.      Objective:   Physical Exam: Vital signs are stable alert and oriented 3. Pulses are palpable. Neurologic sensorium is intact. Degenerative flexors are intact. Muscle strength is normal. She has pes planus with pronation of the medial longitudinal arch and tenderness on palpation at the point of maximal tenderness at the plantar fascia calcaneal insertion site bilaterally.. She presented today with her old x-rays which were reviewed demonstratin old plantar distally oriented calcaneal heel spurs No open lesions or wounds. Soft tissue thickening is visualized at the plantar fascia calcaneal insertion site. She has tenderness on palpation of the posterior tibial tendon right with minimal edema.        Assessment & Plan:  Pes planus history of plantar fasciitis posterior tibial tendon dysfunction right.  Plan: Dispensed plantar fascia brace and she was scanned for orthotics by Liliane Channel.

## 2017-03-25 NOTE — Telephone Encounter (Signed)
Received records form recent ENT visit last week. Pt brought sputum sample laced with black particles that looked fungal. Pt brought back sputum sample here for culture. ENT also recommended referral to pulm for bronchoscopy given unusual appearance of sputum that has been ongoing.  Per pt this is happening every morning.

## 2017-03-27 DIAGNOSIS — H1013 Acute atopic conjunctivitis, bilateral: Secondary | ICD-10-CM | POA: Diagnosis not present

## 2017-03-29 NOTE — Telephone Encounter (Signed)
Patient aware that labs have not been reviewed and has been sent to Dr. Evette Doffing review. Patient verbalized understanding.

## 2017-03-31 ENCOUNTER — Telehealth: Payer: Self-pay | Admitting: Pediatrics

## 2017-03-31 NOTE — Telephone Encounter (Signed)
Left message for patient to call back with lab results. 

## 2017-03-31 NOTE — Telephone Encounter (Signed)
Please let pt know--no malignant cells seen in sputum sample. Has she been able to get in touch with pulmonology/lung doctor to set up appt? They tried to reach her last week (5/9) left a message for her to call to set up appointment.  Urine culture showed no infection.

## 2017-04-01 NOTE — Telephone Encounter (Signed)
Pt notified of results Verbalizes understanding 

## 2017-04-02 NOTE — Telephone Encounter (Signed)
Left message , results were normal.  Provider hopes you have made an appointment with pulmonology.

## 2017-04-02 NOTE — Telephone Encounter (Signed)
The urine culture results that were negative and the cytology results from her sputum which were negative. Has she been able to make appt with pulm? They left a voicemail last week.

## 2017-04-03 ENCOUNTER — Ambulatory Visit (INDEPENDENT_AMBULATORY_CARE_PROVIDER_SITE_OTHER): Payer: Self-pay

## 2017-04-03 ENCOUNTER — Encounter (INDEPENDENT_AMBULATORY_CARE_PROVIDER_SITE_OTHER): Payer: Self-pay | Admitting: Orthopaedic Surgery

## 2017-04-03 ENCOUNTER — Ambulatory Visit (INDEPENDENT_AMBULATORY_CARE_PROVIDER_SITE_OTHER): Payer: BLUE CROSS/BLUE SHIELD | Admitting: Orthopaedic Surgery

## 2017-04-03 VITALS — BP 122/77 | HR 97 | Ht 62.0 in | Wt 140.0 lb

## 2017-04-03 DIAGNOSIS — D1724 Benign lipomatous neoplasm of skin and subcutaneous tissue of left leg: Secondary | ICD-10-CM

## 2017-04-03 DIAGNOSIS — M542 Cervicalgia: Secondary | ICD-10-CM | POA: Diagnosis not present

## 2017-04-03 DIAGNOSIS — M65312 Trigger thumb, left thumb: Secondary | ICD-10-CM

## 2017-04-03 MED ORDER — METHYLPREDNISOLONE ACETATE 40 MG/ML IJ SUSP
13.3300 mg | INTRAMUSCULAR | Status: AC | PRN
  Administered 2017-04-03: 13.33 mg

## 2017-04-03 MED ORDER — LIDOCAINE HCL 1 % IJ SOLN
0.3000 mL | INTRAMUSCULAR | Status: AC | PRN
  Administered 2017-04-03: .3 mL

## 2017-04-03 MED ORDER — BUPIVACAINE HCL 0.25 % IJ SOLN
0.3300 mL | INTRAMUSCULAR | Status: AC | PRN
  Administered 2017-04-03: .33 mL

## 2017-04-03 NOTE — Progress Notes (Signed)
Office Visit Note   Patient: Jennifer Mullins           Date of Birth: [birth year]           MRN: [id number] Visit Date: 04/03/2017              Requested by: Eustaquio Maize, MD [place], [place] [id number] PCP: Eustaquio Maize, MD   Assessment & Plan: Visit Diagnoses:  1. Trigger thumb, left thumb   2. 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 We'll see she does with this if she has persistent triggering she could consider trigger thumb release and states she liked the also had a lipoma removed from her left leg which is gradually enlarged over several years. He is currently on [place] requests and has a few months left before she will hopefully will be stopping it. She will let us know after completion of her treatment.  Follow-Up Instructions: No Follow-up on file.   Orders:  No orders of the defined types were placed in this encounter.  No orders of the defined types were placed in this encounter.     Procedures: Hand/UE Inj Date/Time: 04/03/2017 2:10 PM Performed by: Marybelle Killings Authorized by: Rodell Perna C   Condition: trigger finger   Location:  Thumb Site:  L thumb A1 Medications:  13.33 mg methylPREDNISolone acetate 40 MG/ML; 0.33 mL bupivacaine 0.25 %; 0.3 mL lidocaine 1 %     Clinical Data: No additional findings.   Subjective: Chief Complaint  Patient presents with  . Neck - Pain  . Left Leg - Pain    HPI vision turn she states she had a bumblebee that was on her neck she is trying to get out of her hair posteriorly was flipping her neck back and forth and had increased pain in her neck is concerned somethings happened to it and requested repeat x-rays. She's also had enlarged the lipoma on the left leg in the proximal medial third which had a previous MRI a few years ago which was benign. It's gradually enlarged and become the more symptomatic particularly if she bumps it.  People continue to ask her about what it is. She's also progressive problems with her left thumb with catching particularly with gripping squeezing. She points over the A1 pulley where she's having problems. Sometimes she's had locking and she has to actively extend her thumb. She continues complain of numbness problems in her arms and also numbness in her hand she's had previous carpal tunnel releases years ago. Cerebellar bladder symptoms no fever or chills.  Review of Systems  Constitutional: Negative for chills and diaphoresis.  HENT: Negative for ear discharge, ear pain and nosebleeds.   Eyes: Negative for discharge and visual disturbance.  Respiratory: Negative for cough, choking and shortness of breath.   Cardiovascular: Negative for chest pain and palpitations.  Gastrointestinal: Negative for abdominal distention and abdominal pain.  Endocrine: Negative for cold intolerance and heat intolerance.  Genitourinary: Negative for flank pain and hematuria.  Musculoskeletal: Positive for joint swelling and neck pain.  Skin: Negative for rash and wound.  Neurological: Negative for seizures and speech difficulty.  Hematological: Negative for adenopathy. Does not bruise/bleed easily.  Psychiatric/Behavioral: Negative for agitation and suicidal ideas.     Objective: Vital Signs: BP 122/77   Pulse 97   Ht 5\' 2"  (1.575 m)   Wt 140 lb (63.5  kg)   BMI 25.61 kg/m   Physical Exam  Constitutional: She is oriented to person, place, and time. She appears well-developed.  HENT:  Head: Normocephalic.  Right Ear: External ear normal.  Left Ear: External ear normal.  Eyes: Pupils are equal, round, and reactive to light.  Neck: No tracheal deviation present. No thyromegaly present.  Cardiovascular: Normal rate.   Pulmonary/Chest: Effort normal.  Abdominal: Soft.  Musculoskeletal:  Has well-healed anterior cervical incision. She still has a prolonged expiratory faze.Marland Kitchen She has a somatization  cervical range of motion. Liver with rotation and tilting of her neck. Reflexes are 2+. Tenderness over the A1 pulley reproduces her pain. She is not actively triggering today but has exquisite tenderness over the A1 pulley of palpable nodules noted. Carpal tunnel incisions from her old surgery. There is a 3 x 3 cm palpable subcutaneous mass over the left leg consistent with previous MRI that showed lipoma which we reviewed today in the office on images. She has intact sensation of venous stasis changes. She does not have any ecchymosis and is on L Quist for her previous PE.  Neurological: She is alert and oriented to person, place, and time.  Skin: Skin is warm and dry.  Psychiatric: She has a normal mood and affect. Her behavior is normal.    Ortho Exam  Specialty Comments:  No specialty comments available.  Imaging: No results found.   PMFS History: Patient Active Problem List   Diagnosis Date Noted  . S/P cervical spinal fusion 02/27/2017  . Pulmonary embolism ([facility]) 11/24/2016  . Dysfunctional uterine bleeding 11/24/2016  . Anticoagulated 11/24/2016  . Microcytic anemia 11/24/2016  . Tachycardia   . Vaginal bleeding   . Allergic rhinoconjunctivitis 07/28/2015  . Chronic headache 07/28/2015  . PCOS (polycystic ovarian syndrome) 06/20/2015  . 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 Hyperlipidemia LDL goal <100 06/20/2015  . GAD (generalized anxiety disorder) 05/18/2014  . GERD (gastroesophageal reflux disease) 05/18/2014  . Body fluid retention 07/07/2013  . Chronic sinusitis 07/02/2013  . Extrinsic asthma 05/20/2013  . HTN (hypertension) 05/20/2013  . Prediabetes 05/20/2013   Past Medical History:  Diagnosis Date  . Anemia   . Anxiety   . Arthritis   . Asthma   . Bilateral calcaneal spurs   . Bilateral polycystic ovarian syndrome   . Bulging lumbar disc   . Carpal tunnel syndrome, bilateral   . COPD (chronic obstructive pulmonary disease) ([facility])    BRONCHITIS  .  Diabetes mellitus without complication ([facility])    TYPE 2   DX  4-5 YRS AGO  . Fibroids    uterine  . GAD (generalized anxiety disorder)   . GERD (gastroesophageal reflux disease)    TAKES PRESCRIPTION MEDS  . Glaucoma    BOTH EYES  . Hematoma    Hematoma of Left Hand- had to wear a split  . Hyperlipidemia   . Hypertension   . Hypothyroidism    NODULES ON THYROID  . PONV (postoperative nausea and vomiting)   . Pulmonary embolism ([facility])   . Tachycardia     Family History  Problem Relation Age of Onset  . Hypertension Mother   . Hypertension Father     Past Surgical History:  Procedure Laterality Date  . ANTERIOR CERVICAL DECOMP/DISCECTOMY FUSION N/A 11/13/2016   Procedure: Cervical five-six, Cervical six-seven Anterior Cervical Discectomy and Fusion, Allograft, Plate;  Surgeon: Marybelle Killings, MD;  Location: [facility];  Service: Orthopedics;  Laterality: N/A;  . BREAST SURGERY    .  CARPAL TUNNEL RELEASE    . DILATION AND CURETTAGE OF UTERUS    . EXPLORATORY LAPAROTOMY    . NASAL SINUS SURGERY  07/22/13   Dr. Redmond Pulling in [place] History  . Unemployed    Social History Main Topics  . Smoking status: Former Smoker    Packs/day: 0.25    Years: 5.00    Types: Cigarettes    Quit date: 11/18/1989  . Smokeless tobacco: Never Used  . Alcohol use Yes     Comment: occasional  . Drug use: No  . Sexual activity: Not on file

## 2017-04-04 ENCOUNTER — Ambulatory Visit (INDEPENDENT_AMBULATORY_CARE_PROVIDER_SITE_OTHER): Payer: BLUE CROSS/BLUE SHIELD | Admitting: Family Medicine

## 2017-04-04 ENCOUNTER — Encounter: Payer: Self-pay | Admitting: Family Medicine

## 2017-04-04 VITALS — BP 115/74 | HR 101 | Temp 98.4°F | Ht 62.0 in | Wt 147.0 lb

## 2017-04-04 DIAGNOSIS — M65312 Trigger thumb, left thumb: Secondary | ICD-10-CM

## 2017-04-04 DIAGNOSIS — M79642 Pain in left hand: Secondary | ICD-10-CM | POA: Diagnosis not present

## 2017-04-04 DIAGNOSIS — M79645 Pain in left finger(s): Secondary | ICD-10-CM

## 2017-04-04 MED ORDER — TRAMADOL HCL 50 MG PO TABS: ORAL_TABLET | ORAL | 0 refills | Status: DC

## 2017-04-04 NOTE — Progress Notes (Signed)
Subjective:  Patient ID: Jennifer Mullins, female    DOB: Jun 21, 1971  Age: 46 y.o. MRN: [id number]  CC: Hand Pain (left hand pain & swollen)   HPI Jennifer Mullins presents for Patient is having intense pain since 4:30 this a.m. at the site of a trigger point injection of the left thumb. Of note is that this was performed yesterday in orthopedics by Dr. Allean Found. It seemed to go well at the time. However 4:30 AM she awoke with intense pain at the site. Patient has not been able to get relief due to allergies to multiple pain meds. She called the office where she saw him and he was not there but in [place] today. They told her to put it on ice. She did not try to contact him in the [place] location. Instead she decided to come here later in the day. The pain however has been intense. She has a carpal tunnel brace on. She says that he told her to wear that. However the symptoms are in the thumb and this offers no thumb support. No known injury  History Jennifer Mullins has a past medical history of Anemia; Anxiety; Arthritis; Asthma; Bilateral calcaneal spurs; Bilateral polycystic ovarian syndrome; Bulging lumbar disc; Carpal tunnel syndrome, bilateral; Conjunctivitis; COPD (chronic obstructive pulmonary disease) ([facility]); Diabetes mellitus without complication ([facility]); ETD (Eustachian tube dysfunction), bilateral; Fibroids; GAD (generalized anxiety disorder); GERD (gastroesophageal reflux disease); Glaucoma; Hematoma; Hyperlipidemia; Hypertension; Hypothyroidism; PONV (postoperative nausea and vomiting); Pulmonary embolism ([place]); Tachycardia; and Trigger finger of left thumb.   She has a past surgical history that includes Breast surgery; Carpal tunnel release; Exploratory laparotomy; Nasal sinus surgery (07/22/13); Dilation and curettage of uterus; and Anterior cervical decomp/discectomy fusion (N/A, 11/13/2016).   Her family history includes Hypertension in her father and mother.She reports that she quit smoking about  27 years ago. Her smoking use included Cigarettes. She has a 1.25 pack-year smoking history. She has never used smokeless tobacco. She reports that she does not drink alcohol or use drugs.  Current Outpatient Prescriptions on File Prior to Visit  Medication Sig Dispense Refill  . acetaminophen-codeine (TYLENOL #3) 300-30 MG tablet Take 1 tablet by mouth every 12 (twelve) hours as needed for moderate pain. 30 tablet 0  . albuterol (PROVENTIL) (2.5 MG/3ML) 0.083% nebulizer solution Take 2.5 mg by nebulization every 6 (six) hours as needed for wheezing or shortness of breath.    . ALPRAZolam (XANAX) 0.5 MG tablet Take 1 tablet (0.5 mg total) by mouth 3 (three) times daily as needed for anxiety. 90 tablet 0  . apixaban (ELIQUIS) 5 MG TABS tablet Take 1 tablet (5 mg total) by mouth 2 (two) times daily. 60 tablet 6  . citalopram (CELEXA) 10 MG tablet Take 1 tablet (10 mg total) by mouth daily. 30 tablet 3  . cromolyn (OPTICROM) 4 % ophthalmic solution   1  . cyproheptadine (PERIACTIN) 4 MG tablet Take 1 tablet (4 mg total) by mouth at bedtime. 30 tablet 5  . famotidine (PEPCID) 20 MG tablet TAKE 1 TABLET BY MOUTH AT BEDTIME 30 tablet 5  . ferrous sulfate 325 (65 FE) MG tablet Take 1 tablet (325 mg total) by mouth 2 (two) times daily with a meal. 60 tablet 3  . fluticasone (FLONASE) 50 MCG/ACT nasal spray Place 2 sprays into both nostrils daily.    . furosemide (LASIX) 20 MG tablet Take 1 tablet (20 mg total) by mouth daily. 30 tablet 5  . hydrOXYzine (ATARAX/VISTARIL) 25 MG tablet Take  1-2 tablets (25-50 mg total) by mouth 3 (three) times daily as needed. 90 tablet 1  . ipratropium (ATROVENT) 0.03 % nasal spray Place 2 sprays into both nostrils every 12 (twelve) hours. 30 mL 5  . losartan (COZAAR) 25 MG tablet Take 1 tablet (25 mg total) by mouth daily. 30 tablet 3  . metFORMIN (GLUCOPHAGE) 500 MG tablet Take 1 tablet (500 mg total) by mouth 2 (two) times daily with a meal. 60 tablet 2  . metroNIDAZOLE  (FLAGYL) 250 MG tablet   0  . montelukast (SINGULAIR) 10 MG tablet TAKE 1 TABLET BY MOUTH AT BEDTIME 30 tablet 11  . Olopatadine HCl 0.6 % SOLN Place 1 drop (1 puff total) into the nose every morning. (Patient taking differently: Place 2 puffs into the nose 2 (two) times daily. ) 30.5 g 3  . pantoprazole (PROTONIX) 40 MG tablet TAKE 1 TABLET BY MOUTH EVERY DAY 30 tablet 5  . polyethylene glycol (MIRALAX / GLYCOLAX) packet Take 17 g by mouth daily as needed for mild constipation. 14 each 0  . pravastatin (PRAVACHOL) 20 MG tablet TAKE 1 TABLET BY MOUTH EVERY DAY 30 tablet 0  . prednisoLONE acetate (PRED FORTE) 1 % ophthalmic suspension   0  . PROAIR HFA 108 (90 Base) MCG/ACT inhaler INHALE 1-2 PUFFS EVERY FOUR HOURS AS NEEDED WHEEZING SHORTNESS OF BREATH 8.5 g 5  . SYMBICORT 160-4.5 MCG/ACT inhaler INHALE 2 PUFFS BY MOUTH FIRST THING IN THE MORNING AND THEN 2 PUFFS BY MOUTH 12 HOURS LATER 10.2 g 5   No current facility-administered medications on file prior to visit.     ROS Review of Systems  Constitutional: Negative.   HENT: Negative.   Respiratory: Negative.   Cardiovascular: Negative.   Musculoskeletal: Positive for arthralgias, joint swelling and myalgias.  Neurological: Negative for dizziness.  Psychiatric/Behavioral: Positive for agitation and sleep disturbance. The patient is nervous/anxious.     Objective:  BP 115/74   Pulse (!) 101   Temp 98.4 F (36.9 C) (Oral)   Ht 5\' 2"  (1.575 m)   Wt 147 lb (66.7 kg)   BMI 26.89 kg/m   Physical Exam  Constitutional: She appears well-developed and well-nourished. She appears distressed.  HENT:  Head: Normocephalic and atraumatic.  Eyes: EOM are normal.  Neck: Normal range of motion.  Cardiovascular: Normal rate and regular rhythm.   Pulmonary/Chest: Effort normal and breath sounds normal.  Musculoskeletal: Normal range of motion. She exhibits tenderness (exquisitely tender at the injection site of the left thumb palmar at the  palmar MCP. There is slight hematoma formation. Range of motion is normal but resisted due to intense discomfort for passive flexion at the first MCP).  Skin: Skin is warm and dry.  Psychiatric: Her mood appears anxious. She is agitated.  distraught    Assessment & Plan:   Jennifer Mullins was seen today for hand pain.  Diagnoses and all orders for this visit:  Trigger finger of left thumb  Pain of left thumb  Other orders -     traMADol (ULTRAM) 50 MG tablet; One or two four times a day as needed for pain   I am having Ms. Womack start on traMADol. I am also having her maintain her fluticasone, ferrous sulfate, polyethylene glycol, SYMBICORT, ipratropium, cyproheptadine, Olopatadine HCl, PROAIR HFA, albuterol, metFORMIN, metroNIDAZOLE, apixaban, furosemide, cromolyn, prednisoLONE acetate, losartan, citalopram, ALPRAZolam, hydrOXYzine, acetaminophen-codeine, pantoprazole, pravastatin, montelukast, and famotidine.  Meds ordered this encounter  Medications  . traMADol (ULTRAM) 50 MG tablet  Sig: One or two four times a day as needed for pain    Dispense:  24 tablet    Refill:  0   I believe the patient needs to be evaluated by orthopedics. I am concerned that the injection may have ruptured some tissue otherwise I do not know why she would have such a strong painful reaction. There is no sign of tissue necrosis  Follow-up: Patient advised to proceed to the [facility] emergency department. Ask if the orthopedist on-call for [facility] orthopedics is available.  Claretta Fraise, M.D.

## 2017-04-04 NOTE — Patient Instructions (Addendum)
Ice frequently, Elevate also. Use pain med as directed on label. If pain persists go to the Emergency departmen t

## 2017-04-07 ENCOUNTER — Telehealth: Payer: Self-pay | Admitting: Pediatrics

## 2017-04-07 ENCOUNTER — Encounter: Payer: Self-pay | Admitting: Podiatry

## 2017-04-07 NOTE — Progress Notes (Signed)
Patient picked up copy of requested office visit note.

## 2017-04-07 NOTE — Telephone Encounter (Signed)
I don't recall whether it was printed or not. However it's for a very small amount of medicine so I do not have any objection to giving her the benefit of the doubt and either reprinting it or calling it in.

## 2017-04-07 NOTE — Telephone Encounter (Signed)
Jennifer Mullins saw Dr. Livia Snellen Friday 5/18.  Would like to have the traMADol (ULTRAM) 50 MG tablet filled after all.  Her thumb is really hurting.  She originally refused it, but has changed her mind.  Would you please send to [facility] for her.

## 2017-04-07 NOTE — Telephone Encounter (Signed)
Patient aware that Tramadol Rx has been called in to [facility] on 04/07/17

## 2017-04-07 NOTE — Telephone Encounter (Signed)
Office note 5-18 is ready to pick-up. Patient was notified.

## 2017-04-08 ENCOUNTER — Other Ambulatory Visit: Payer: Self-pay | Admitting: Nurse Practitioner

## 2017-04-08 DIAGNOSIS — J4522 Mild intermittent asthma with status asthmaticus: Secondary | ICD-10-CM

## 2017-04-10 ENCOUNTER — Ambulatory Visit (INDEPENDENT_AMBULATORY_CARE_PROVIDER_SITE_OTHER): Payer: BLUE CROSS/BLUE SHIELD | Admitting: Orthopaedic Surgery

## 2017-04-10 DIAGNOSIS — H1013 Acute atopic conjunctivitis, bilateral: Secondary | ICD-10-CM | POA: Diagnosis not present

## 2017-04-15 ENCOUNTER — Ambulatory Visit (INDEPENDENT_AMBULATORY_CARE_PROVIDER_SITE_OTHER): Payer: BLUE CROSS/BLUE SHIELD

## 2017-04-15 ENCOUNTER — Ambulatory Visit (INDEPENDENT_AMBULATORY_CARE_PROVIDER_SITE_OTHER): Payer: BLUE CROSS/BLUE SHIELD | Admitting: Podiatry

## 2017-04-15 ENCOUNTER — Encounter: Payer: Self-pay | Admitting: Podiatry

## 2017-04-15 ENCOUNTER — Other Ambulatory Visit: Payer: BLUE CROSS/BLUE SHIELD

## 2017-04-15 DIAGNOSIS — M722 Plantar fascial fibromatosis: Secondary | ICD-10-CM | POA: Diagnosis not present

## 2017-04-15 DIAGNOSIS — M2140 Flat foot [pes planus] (acquired), unspecified foot: Secondary | ICD-10-CM

## 2017-04-15 DIAGNOSIS — S9032XA Contusion of left foot, initial encounter: Secondary | ICD-10-CM

## 2017-04-15 DIAGNOSIS — M779 Enthesopathy, unspecified: Secondary | ICD-10-CM

## 2017-04-15 DIAGNOSIS — M76829 Posterior tibial tendinitis, unspecified leg: Secondary | ICD-10-CM

## 2017-04-15 NOTE — Progress Notes (Signed)
She presents today initially to pick up her orthotics which have not arrived yet. She states the screen door came across the top of her left foot across her hallux and she was letting her Out. She states is been really sore at this same toe that she injured many years ago as a child. She states it has improved over the past few days. She states that her plantar fasciitis seems to be doing better particularly with the plantar fascia braces. She would like to obtain another brace for the left foot.  Objective: Vital signs are stable she is alert and oriented 3. Pulses are palpable. She has minimal tenderness on palpation medial calcaneal tubercles bilateral she has minimal edema overlying the hallux of the left foot. Mild edema with mild tenderness on palpation particularly the medial lateral condyles of the hallux interphalangeal joint. She does not have full extension of the joint more than likely secondary to previous injury. Radiographs taken today evaluated with the patient do not demonstrate any type of osseus abnormalities in this area. She still has full extension with her extensor hallucis longus tendon and there is no pain on range of motion of this tendon.  Assessment: Bony contusion hallux left. Resolving plantar fasciitis.  Plan: Recommended ice therapy for the contusion if necessary. Did not recommend any other anti-inflammatories. We did dispense another plantar fascia brace for the left foot. She will wear the braces during the day and her plantar fascia boots at nighttime. I recommended that she continue to wear tennis shoes the majority of the time.

## 2017-04-16 ENCOUNTER — Ambulatory Visit
Admission: RE | Admit: 2017-04-16 | Discharge: 2017-04-16 | Disposition: A | Discharge status: Home / Self Care | Payer: BLUE CROSS/BLUE SHIELD | Source: Ambulatory Visit | Attending: Allergy and Immunology | Admitting: Allergy and Immunology

## 2017-04-16 ENCOUNTER — Telehealth: Payer: Self-pay | Admitting: Allergy and Immunology

## 2017-04-16 ENCOUNTER — Ambulatory Visit (INDEPENDENT_AMBULATORY_CARE_PROVIDER_SITE_OTHER): Payer: BLUE CROSS/BLUE SHIELD | Admitting: Allergy and Immunology

## 2017-04-16 ENCOUNTER — Encounter: Payer: Self-pay | Admitting: Allergy and Immunology

## 2017-04-16 VITALS — BP 112/68 | HR 84 | Resp 16

## 2017-04-16 DIAGNOSIS — H1045 Other chronic allergic conjunctivitis: Secondary | ICD-10-CM | POA: Diagnosis not present

## 2017-04-16 DIAGNOSIS — J4540 Moderate persistent asthma, uncomplicated: Secondary | ICD-10-CM | POA: Diagnosis not present

## 2017-04-16 DIAGNOSIS — R51 Headache: Secondary | ICD-10-CM | POA: Diagnosis not present

## 2017-04-16 DIAGNOSIS — K219 Gastro-esophageal reflux disease without esophagitis: Secondary | ICD-10-CM

## 2017-04-16 DIAGNOSIS — H1010 Acute atopic conjunctivitis, unspecified eye: Secondary | ICD-10-CM

## 2017-04-16 DIAGNOSIS — H6982 Other specified disorders of Eustachian tube, left ear: Secondary | ICD-10-CM | POA: Diagnosis not present

## 2017-04-16 DIAGNOSIS — R519 Headache, unspecified: Secondary | ICD-10-CM

## 2017-04-16 DIAGNOSIS — J4522 Mild intermittent asthma with status asthmaticus: Secondary | ICD-10-CM

## 2017-04-16 DIAGNOSIS — R05 Cough: Secondary | ICD-10-CM | POA: Diagnosis not present

## 2017-04-16 DIAGNOSIS — J3089 Other allergic rhinitis: Secondary | ICD-10-CM

## 2017-04-16 LAB — CBC WITH DIFFERENTIAL/PLATELET
Basophils Absolute: 56 cells/uL (ref 0–200)
Basophils Relative: 1 %
Eosinophils Absolute: 504 cells/uL — ABNORMAL HIGH (ref 15–500)
Eosinophils Relative: 9 %
HCT: 31.8 % — ABNORMAL LOW (ref 35.0–45.0)
Hemoglobin: 9.8 g/dL — ABNORMAL LOW (ref 11.7–15.5)
Lymphocytes Relative: 32 %
Lymphs Abs: 1792 cells/uL (ref 850–3900)
MCH: 23.6 pg — ABNORMAL LOW (ref 27.0–33.0)
MCHC: 30.8 g/dL — ABNORMAL LOW (ref 32.0–36.0)
MCV: 76.4 fL — ABNORMAL LOW (ref 80.0–100.0)
MPV: 9.6 fL (ref 7.5–12.5)
Monocytes Absolute: 280 cells/uL (ref 200–950)
Monocytes Relative: 5 %
Neutro Abs: 2968 cells/uL (ref 1500–7800)
Neutrophils Relative %: 53 %
Platelets: 245 10*3/uL (ref 140–400)
RBC: 4.16 MIL/uL (ref 3.80–5.10)
RDW: 17.7 % — ABNORMAL HIGH (ref 11.0–15.0)
WBC: 5.6 10*3/uL (ref 3.8–10.8)

## 2017-04-16 MED ORDER — BUDESONIDE-FORMOTEROL FUMARATE 160-4.5 MCG/ACT IN AERO: 2.0000 | INHALATION_SPRAY | Freq: Two times a day (BID) | RESPIRATORY_TRACT | 5 refills | Status: DC

## 2017-04-16 MED ORDER — FLUTICASONE PROPIONATE 50 MCG/ACT NA SUSP: 1.0000 | Freq: Two times a day (BID) | NASAL | 5 refills | Status: DC

## 2017-04-16 MED ORDER — OLOPATADINE HCL 0.1 % OP SOLN: 1.0000 [drp] | Freq: Two times a day (BID) | OPHTHALMIC | 5 refills | Status: DC

## 2017-04-16 MED ORDER — MONTELUKAST SODIUM 10 MG PO TABS: 10.0000 mg | ORAL_TABLET | Freq: Every day | ORAL | 5 refills | Status: DC

## 2017-04-16 MED ORDER — OLOPATADINE HCL 0.6 % NA SOLN: NASAL | 5 refills | Status: DC

## 2017-04-16 NOTE — Patient Instructions (Addendum)
  1. Start Immunotherapy in June  2. Continue to Treat and prevent inflammation:   A. Symbicort 160 - 2 inhalations twice a day  C. Flonase - one spray each nostril twice a day  D. montelukast - one tablet one time per day  3. Continue to Treat and prevent reflux:   A. pantoprazole 40 mg in a.m.  B. famotidine 20 mg in PM  4. Treat and prevent headaches:   A. continue cyproheptadine 4 mg tablet at bedtime  5. If needed:   A. Patanase 2 sprays each nostril two times per day  B. OTC antihistamine  C. Proventil HFA or similar 2 inhalations every 4-6 hours  6. Blood - CBC w/diff, IgE, ANA w/reflex. Xolair? Nucala? Berna Bue?  7. Obtain a Chest X-ray  8. Continue eye therapy with Dr. Delman Cheadle.  9. Return to clinic in Summer 2018 or earlier if problem

## 2017-04-16 NOTE — Telephone Encounter (Signed)
patient has instructions from her opthamologist. Patient does not need to take the medication Prescribeb by KOZLOW today  Patient is to use the PATANOL  Every 4-6 hours 1 drop each eye Patient is to use the Oto up to 4 times daily Patient is to use the  DICLOFENAC every 8 hours 1 drop each eye

## 2017-04-16 NOTE — Telephone Encounter (Signed)
Pt was told by her ophthalmologist that her eye medications "did not need to change."

## 2017-04-16 NOTE — Progress Notes (Signed)
Follow-up Note  Referring Provider: Eustaquio Maize, MD Primary Provider: Eustaquio Maize, MD Date of Office Visit: 04/16/2017  Subjective:   Jennifer Mullins (DOB: 1971-05-21) is a 46 y.o. female who returns to the [facility] [place] on 04/16/2017 in re-evaluation of the following:  HPI: Arlynn returns to this clinic in reevaluation of her multiorgan atopic disease including asthma and allergic rhinoconjunctivitis and a history of chronic sinusitis with a normal sinus CT scan Spring 2017 and chronic cephalgia and ETD and reflux. I last saw her in this clinic on 03/12/2017.  She believes that her lung condition is under pretty good control at this point in time and she does not need to use a short acting bronchodilator and has not required a systemic steroid for treatment of an exacerbation.   She still does have cough and sputum production on occasion. This usually occurs upon awakening in the morning. She describes these "black specks" that appear to be in her sputum. Apparently this is been a problem ever since her pulmonary embolus in January. She has had cytology performed on her sputum this month and there are no malignant cells present.  Her nose has been doing quite well. She has not required an antibiotic to treat any type of sinus infection. Her ETD has been evaluated by Dr. Elwyn Reach.  Her eyes have been a particular bad problem. She originally had treatment from her Optometrist and she is now seeing Dr. 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 She has a repeat appointment to see Dr. Delman Cheadle in the next 10 days.  Reflux has not been a problem.  Headaches have not been a problem.  Allergies as of 04/16/2017      Reactions   Demerol [meperidine] Shortness Of Breath   FLUSHING AND SHORTNESS OF BREATH   Morphine And Related Shortness Of Breath    Flushed and hot hyper   Diltiazem Hcl Hives   Lisinopril    Caused cough   Tramadol Other (See Comments)   Tears up stomach      Medication List      acetaminophen-codeine 300-30 MG tablet Commonly known as:  TYLENOL #3 Take 1 tablet by mouth every 12 (twelve) hours as needed for moderate pain.   ALPRAZolam 0.5 MG tablet Commonly known as:  XANAX Take 1 tablet (0.5 mg total) by mouth 3 (three) times daily as needed for anxiety.   apixaban 5 MG Tabs tablet Commonly known as:  ELIQUIS Take 1 tablet (5 mg total) by mouth 2 (two) times daily.   budesonide-formoterol 160-4.5 MCG/ACT inhaler Commonly known as:  SYMBICORT Inhale 2 puffs into the lungs 2 (two) times daily.   citalopram 10 MG tablet Commonly known as:  CELEXA Take 1 tablet (10 mg total) by mouth daily.   cromolyn 4 % ophthalmic solution Commonly known as:  OPTICROM   cyproheptadine 4 MG tablet Commonly known as:  PERIACTIN Take 1 tablet (4 mg total) by mouth at bedtime.   diclofenac 0.1 % ophthalmic solution Commonly known as:  VOLTAREN   famotidine 20 MG tablet Commonly known as:  PEPCID TAKE 1 TABLET BY MOUTH AT BEDTIME   ferrous sulfate 325 (65 FE) MG tablet Take 1 tablet (325 mg total) by mouth 2 (two) times daily with a meal.   fluticasone 50 MCG/ACT nasal spray Commonly known as:  FLONASE Place 1 spray into both nostrils 2 (two) times daily.   furosemide 20 MG tablet Commonly known as:  LASIX Take 1 tablet (20 mg total) by mouth daily.   hydrOXYzine 25 MG tablet Commonly known as:  ATARAX/VISTARIL Take 1-2 tablets (25-50 mg total) by mouth 3 (three) times daily as needed.   ipratropium 0.03 % nasal spray Commonly known as:  ATROVENT Place 2 sprays into both nostrils every 12 (twelve) hours.   losartan 25 MG tablet Commonly known as:  COZAAR Take 1 tablet (25 mg total) by mouth daily.   metFORMIN 500 MG tablet Commonly known as:  GLUCOPHAGE Take 1 tablet (500 mg total) by mouth 2  (two) times daily with a meal.   montelukast 10 MG tablet Commonly known as:  SINGULAIR Take 1 tablet (10 mg total) by mouth at bedtime.   Olopatadine HCl 0.6 % Soln Place 1 drop (1 puff total) into the nose every morning.   pantoprazole 40 MG tablet Commonly known as:  PROTONIX TAKE 1 TABLET BY MOUTH EVERY DAY   polyethylene glycol packet Commonly known as:  MIRALAX / GLYCOLAX Take 17 g by mouth daily as needed for mild constipation.   pravastatin 20 MG tablet Commonly known as:  PRAVACHOL TAKE 1 TABLET BY MOUTH EVERY DAY   albuterol (2.5 MG/3ML) 0.083% nebulizer solution Commonly known as:  PROVENTIL Take 2.5 mg by nebulization every 6 (six) hours as needed for wheezing or shortness of breath.   PROAIR HFA 108 (90 Base) MCG/ACT inhaler Generic drug:  albuterol INHALE 1-2 PUFFS EVERY FOUR HOURS AS NEEDED WHEEZING SHORTNESS OF BREATH   traMADol 50 MG tablet Commonly known as:  ULTRAM One or two four times a day as needed for pain       Past Medical History:  Diagnosis Date  . Anemia   . Anxiety   . Arthritis   . Asthma   . Bilateral calcaneal spurs   . Bilateral polycystic ovarian syndrome   . Bulging lumbar disc   . Carpal tunnel syndrome, bilateral   . Conjunctivitis   . COPD (chronic obstructive pulmonary disease) (HCC)    BRONCHITIS  . Diabetes mellitus without complication ([place])    TYPE 2   DX  4-5 YRS AGO  . ETD (Eustachian tube dysfunction), bilateral    left worse than right  . Fibroids    uterine  . GAD (generalized anxiety disorder)   . GERD (gastroesophageal reflux disease)    TAKES PRESCRIPTION MEDS  . Glaucoma    BOTH EYES  . Hematoma    Hematoma of Left Hand- had to wear a split  . Hyperlipidemia   . Hypertension   . Hypothyroidism    NODULES ON THYROID  . PONV (postoperative nausea and vomiting)   . Pulmonary embolism ([place])   . Tachycardia   . Trigger finger   . Trigger finger of left thumb     Past Surgical History:  Procedure  Laterality Date  . ANTERIOR CERVICAL DECOMP/DISCECTOMY FUSION N/A 11/13/2016   Procedure: Cervical five-six, Cervical six-seven Anterior Cervical Discectomy and Fusion, Allograft, Plate;  Surgeon: Marybelle Killings, MD;  Location: [facility];  Service: Orthopedics;  Laterality: N/A;  . BREAST SURGERY    . CARPAL TUNNEL RELEASE    . DILATION AND CURETTAGE OF UTERUS    . EXPLORATORY LAPAROTOMY    . NASAL SINUS SURGERY  07/22/13   Dr. Redmond Pulling in [place]    Review of systems negative except as noted  in HPI / PMHx or noted below:  Review of Systems  Constitutional: Negative.   HENT: Negative.   Eyes: Negative.   Respiratory: Negative.   Cardiovascular: Negative.   Gastrointestinal: Negative.   Genitourinary: Negative.   Musculoskeletal: Negative.   Skin: Negative.   Neurological: Negative.   Endo/Heme/Allergies: Negative.   Psychiatric/Behavioral: Negative.      Objective:   Vitals:   04/16/17 1111  BP: 112/68  Pulse: 84  Resp: 16          Physical Exam  Constitutional: She is well-developed, well-nourished, and in no distress.  HENT:  Head: Normocephalic.  Right Ear: Tympanic membrane, external ear and ear canal normal.  Left Ear: Tympanic membrane, external ear and ear canal normal.  Nose: Nose normal. No mucosal edema or rhinorrhea.  Mouth/Throat: Uvula is midline, oropharynx is clear and moist and mucous membranes are normal. No oropharyngeal exudate.  Eyes: Right conjunctiva is injected. Left conjunctiva is injected.  Neck: Trachea normal. No tracheal tenderness present. No tracheal deviation present. No thyromegaly present.  Cardiovascular: Normal rate, regular rhythm, S1 normal, S2 normal and normal heart sounds.   No murmur heard. Pulmonary/Chest: Breath sounds normal. No stridor. No respiratory distress. She has no wheezes. She has no rales.  Musculoskeletal: She exhibits no edema.  Lymphadenopathy:       Head (right side): No tonsillar adenopathy present.       Head (left  side): No tonsillar adenopathy present.    She has no cervical adenopathy.  Neurological: She is alert. Gait normal.  Skin: No rash noted. She is not diaphoretic. No erythema. Nails show no clubbing.  Psychiatric: Mood and affect normal.    Diagnostics:    Spirometry was performed and demonstrated an FEV1 of 1.69 at 80 % of predicted.  The patient had an Asthma Control Test with the following results: ACT Total Score: 14.    Assessment and Plan:   1. Asthma, moderate persistent, well-controlled   2. Other allergic rhinitis   3. Seasonal allergic conjunctivitis   4. Headache disorder   5. LPRD (laryngopharyngeal reflux disease)   6. ETD (Eustachian tube dysfunction), left     1. Start Immunotherapy in June  2. Continue to Treat and prevent inflammation:   A. Symbicort 160 - 2 inhalations twice a day  C. Flonase - one spray each nostril twice a day  D. montelukast - one tablet one time per day  3. Continue to Treat and prevent reflux:   A. pantoprazole 40 mg in a.m.  B. famotidine 20 mg in PM  4. Treat and prevent headaches:   A. continue cyproheptadine 4 mg tablet at bedtime  5. If needed:   A. Patanase 2 sprays each nostril two times per day  B. OTC antihistamine  C. Proventil HFA or similar 2 inhalations every 4-6 hours  6. Blood - CBC w/diff, IgE, ANA w/reflex. Xolair? Nucala? Berna Bue?  7. Obtain a Chest X-ray  8. Continue eye therapy with Dr. Delman Cheadle.  9. Return to clinic in Summer 2018 or earlier if problem  Keydi will start a course of immunotherapy in an attempt to help her multiorgan atopic disease and I will see if she is a candidate for a biological agent as well. 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 It is worrisome that in the face of utilizing multiple anti-inflammatory agents for her conjunctiva including topical steroids that she is not resolving  this  inflammation. Even in the face of systemic steroids she did not really resolve her inflammation very well. I am going to check some blood tests to see for dealing with a possible autoimmune condition and contributing to her eye issue although this truly appears to be more of a conjunctivitis that it does a scleritis. She still has some occasional cough in the morning and makes what sounds like a very unusual pattern of sputum production. There does not really appear to be a history consistent with an infectious disease whether that be bacterial or fungal or mycobacterial contributing to the sputum. She did have what appeared to be a lingular infiltrate associated with her pulmonary embolus back in January and I wonder if she had some degree of infarct in that lung parenchyma during that event and is now clearing out the damage produced by that a vascular phenomena. If her chest x-ray still shows an abnormality she will need a follow-up CT scan to further define whether her pulmonary embolus has resolved with apixaban or there is some other process contributing to that infiltrate and sputum production.  Allena Katz, MD Allergy / Immunology [place]

## 2017-04-17 LAB — ANA: Anti Nuclear Antibody(ANA): NEGATIVE

## 2017-04-17 LAB — IGE: IgE (Immunoglobulin E), Serum: 1256 kU/L — ABNORMAL HIGH (ref <115)

## 2017-04-17 NOTE — Addendum Note (Signed)
Addended by: Gara Kroner L on: 04/17/2017 02:55 PM   Modules accepted: Orders

## 2017-04-18 ENCOUNTER — Other Ambulatory Visit: Payer: Self-pay | Admitting: Nurse Practitioner

## 2017-04-18 ENCOUNTER — Other Ambulatory Visit: Payer: Self-pay | Admitting: Pediatrics

## 2017-04-18 DIAGNOSIS — E785 Hyperlipidemia, unspecified: Secondary | ICD-10-CM

## 2017-04-21 ENCOUNTER — Ambulatory Visit: Payer: BLUE CROSS/BLUE SHIELD | Admitting: Orthotics

## 2017-04-21 DIAGNOSIS — M79671 Pain in right foot: Secondary | ICD-10-CM

## 2017-04-21 DIAGNOSIS — M79672 Pain in left foot: Secondary | ICD-10-CM

## 2017-04-21 NOTE — Progress Notes (Signed)
Patient came in to p/up CMFO to address plantar fas pain, bilateral.   The F/O gave her arch full contact and she reported no issues as to comfort.   She was advised to wear for one week and make up f/up appointment to assess if a scaphoid pad needs to be added bilat.

## 2017-04-22 ENCOUNTER — Telehealth: Payer: Self-pay | Admitting: Pediatrics

## 2017-04-22 MED ORDER — RANITIDINE HCL 150 MG PO CAPS: 150.0000 mg | ORAL_CAPSULE | Freq: Two times a day (BID) | ORAL | 3 refills | Status: DC

## 2017-04-22 NOTE — Addendum Note (Signed)
Addended by: Eustaquio Maize on: 04/22/2017 05:07 PM   Modules accepted: Orders

## 2017-04-22 NOTE — Telephone Encounter (Signed)
Inusrance will not cover pepcid. Patient would like know if you wanted to switch medication

## 2017-04-23 ENCOUNTER — Other Ambulatory Visit: Payer: Self-pay | Admitting: Family Medicine

## 2017-04-23 ENCOUNTER — Telehealth: Payer: Self-pay | Admitting: Pediatrics

## 2017-04-23 ENCOUNTER — Other Ambulatory Visit: Payer: Self-pay | Admitting: Nurse Practitioner

## 2017-04-23 DIAGNOSIS — J301 Allergic rhinitis due to pollen: Secondary | ICD-10-CM | POA: Diagnosis not present

## 2017-04-23 NOTE — Telephone Encounter (Signed)
Tramadol is controlled substance, has to be seen for Rx for that medication

## 2017-04-24 ENCOUNTER — Ambulatory Visit (INDEPENDENT_AMBULATORY_CARE_PROVIDER_SITE_OTHER): Payer: BLUE CROSS/BLUE SHIELD | Admitting: Family

## 2017-04-24 ENCOUNTER — Telehealth: Payer: Self-pay | Admitting: Pediatrics

## 2017-04-24 ENCOUNTER — Encounter: Payer: Self-pay | Admitting: Family

## 2017-04-24 ENCOUNTER — Other Ambulatory Visit: Payer: Self-pay | Admitting: Allergy and Immunology

## 2017-04-24 VITALS — BP 118/74 | HR 94 | Temp 99.6°F | Ht 62.0 in | Wt 147.0 lb

## 2017-04-24 DIAGNOSIS — M2550 Pain in unspecified joint: Secondary | ICD-10-CM

## 2017-04-24 DIAGNOSIS — J3089 Other allergic rhinitis: Secondary | ICD-10-CM

## 2017-04-24 MED ORDER — DULOXETINE HCL 30 MG PO CPEP: 30.0000 mg | ORAL_CAPSULE | Freq: Every day | ORAL | 3 refills | Status: DC

## 2017-04-24 NOTE — Progress Notes (Signed)
   Subjective:    Patient ID: Jennifer Mullins, female    DOB: 20-Jul-1971, 46 y.o.   MRN: [id number]  HPI Pt presents to the office today with generalized joint pain in bilateral knee, hands, arms,  Legs, feet, shoulders,  and neck. Pt states her leg pain has been going on for years, but her hands and arms started today with swelling in her fingers.   Pt denies any recent trauma  tick bite, or rash.   Review of Systems  HENT: Positive for congestion, postnasal drip and sinus pain.   Musculoskeletal: Positive for arthralgias and gait problem.  All other systems reviewed and are negative.      Objective:   Physical Exam  Constitutional: She is oriented to person, place, and time. She appears well-developed and well-nourished. No distress.  HENT:  Head: Normocephalic.  Eyes: Pupils are equal, round, and reactive to light.  Neck: Normal range of motion. Neck supple. No thyromegaly present.  Cardiovascular: Normal rate, regular rhythm, normal heart sounds and intact distal pulses.   No murmur heard. Pulmonary/Chest: Effort normal and breath sounds normal. No respiratory distress. She has no wheezes.  Abdominal: Soft. Bowel sounds are normal. She exhibits no distension. There is no tenderness.  Musculoskeletal: Normal range of motion. She exhibits no edema or tenderness.  Generalized joint pain in hands and feet, full ROM  Neurological: She is alert and oriented to person, place, and time.  Skin: Skin is warm and dry.  Psychiatric: She has a normal mood and affect. Her behavior is normal. Judgment and thought content normal.  Vitals reviewed.    BP 118/74   Pulse 94   Temp 99.6 F (37.6 C) (Oral)   Ht 5\' 2"  (1.575 m)   Wt 147 lb (66.7 kg)   BMI 26.89 kg/m      Assessment & Plan:  1. Generalized joint pain - DULoxetine (CYMBALTA) 30 MG capsule; Take 1 capsule (30 mg total) by mouth daily.  Dispense: 30 capsule; Refill: 3 - CBC with Differential/Platelet - C-reactive protein -  Sedimentation rate  Discussed with PCP We will d/c celexa and start cymbalta today Labs pending  Encourage ROM exercises  Follow up with PCP in 1-2 weeks   Evelina Dun, FNP

## 2017-04-24 NOTE — Telephone Encounter (Signed)
appt scheduled Pt notified 

## 2017-04-24 NOTE — Patient Instructions (Signed)

## 2017-04-24 NOTE — Telephone Encounter (Signed)
Patient has been seen since phone call this encounter will now be closed

## 2017-04-24 NOTE — Progress Notes (Signed)
Vials to be made 04-24-17 jm

## 2017-04-25 ENCOUNTER — Encounter: Payer: Self-pay | Admitting: Pediatrics

## 2017-04-25 ENCOUNTER — Ambulatory Visit (INDEPENDENT_AMBULATORY_CARE_PROVIDER_SITE_OTHER): Payer: BLUE CROSS/BLUE SHIELD | Admitting: Pediatrics

## 2017-04-25 VITALS — BP 116/75 | HR 96 | Temp 98.0°F | Ht 62.0 in | Wt 147.4 lb

## 2017-04-25 DIAGNOSIS — K5900 Constipation, unspecified: Secondary | ICD-10-CM

## 2017-04-25 DIAGNOSIS — D509 Iron deficiency anemia, unspecified: Secondary | ICD-10-CM

## 2017-04-25 DIAGNOSIS — E785 Hyperlipidemia, unspecified: Secondary | ICD-10-CM | POA: Diagnosis not present

## 2017-04-25 DIAGNOSIS — M542 Cervicalgia: Secondary | ICD-10-CM

## 2017-04-25 DIAGNOSIS — F411 Generalized anxiety disorder: Secondary | ICD-10-CM

## 2017-04-25 LAB — CBC WITH DIFFERENTIAL/PLATELET
Basophils Absolute: 0 10*3/uL (ref 0.0–0.2)
Basos: 0 %
EOS (ABSOLUTE): 0.3 10*3/uL (ref 0.0–0.4)
Eos: 5 %
Hematocrit: 29.6 % — ABNORMAL LOW (ref 34.0–46.6)
Hemoglobin: 9.3 g/dL — ABNORMAL LOW (ref 11.1–15.9)
Immature Grans (Abs): 0 10*3/uL (ref 0.0–0.1)
Immature Granulocytes: 0 %
Lymphocytes Absolute: 1.9 10*3/uL (ref 0.7–3.1)
Lymphs: 35 %
MCH: 23.8 pg — ABNORMAL LOW (ref 26.6–33.0)
MCHC: 31.4 g/dL — ABNORMAL LOW (ref 31.5–35.7)
MCV: 76 fL — ABNORMAL LOW (ref 79–97)
Monocytes Absolute: 0.3 10*3/uL (ref 0.1–0.9)
Monocytes: 6 %
Neutrophils Absolute: 2.9 10*3/uL (ref 1.4–7.0)
Neutrophils: 54 %
Platelets: 251 10*3/uL (ref 150–379)
RBC: 3.91 x10E6/uL (ref 3.77–5.28)
RDW: 18.4 % — ABNORMAL HIGH (ref 12.3–15.4)
WBC: 5.5 10*3/uL (ref 3.4–10.8)

## 2017-04-25 LAB — SEDIMENTATION RATE: Sed Rate: 25 mm/hr (ref 0–32)

## 2017-04-25 LAB — C-REACTIVE PROTEIN: CRP: 3.5 mg/L (ref 0.0–4.9)

## 2017-04-25 MED ORDER — ALPRAZOLAM 0.5 MG PO TABS: 0.5000 mg | ORAL_TABLET | Freq: Three times a day (TID) | ORAL | 0 refills | Status: DC | PRN

## 2017-04-25 MED ORDER — ACETAMINOPHEN-CODEINE #3 300-30 MG PO TABS: 1.0000 | ORAL_TABLET | Freq: Two times a day (BID) | ORAL | 1 refills | Status: DC | PRN

## 2017-04-25 MED ORDER — FERROUS SULFATE 325 (65 FE) MG PO TABS: 325.0000 mg | ORAL_TABLET | ORAL | 3 refills | Status: DC

## 2017-04-25 MED ORDER — PRAVASTATIN SODIUM 20 MG PO TABS: 20.0000 mg | ORAL_TABLET | ORAL | 0 refills | Status: DC

## 2017-04-25 MED ORDER — LINACLOTIDE 145 MCG PO CAPS: 145.0000 ug | ORAL_CAPSULE | Freq: Every day | ORAL | 0 refills | Status: DC

## 2017-04-25 MED ORDER — RANITIDINE HCL 150 MG PO CAPS: 150.0000 mg | ORAL_CAPSULE | Freq: Every evening | ORAL | 3 refills | Status: DC

## 2017-04-25 NOTE — Progress Notes (Addendum)
Subjective:   Patient ID: Jennifer Mullins, female    DOB: Jun 03, 1971, 46 y.o.   MRN: [id number] CC: Joint Pain (Fingers and Knees)  HPI: Jennifer Mullins is a 46 y.o. female presenting for Joint Pain (Fingers and Knees)  Has had pain in legs and arms for years Decreased pravastatin in past few months, thinks helped some with joint pain Hands and feet have been hurting, new for past few days Thinks hands have been slightly swollen No redness, no fevers Finished tylenol #3 a couple days ago Did help some with pain, improved her mobility  PE: on eliquis now for provoked PE following surgery, plan 6 mo treatment, will end 05/18/2017  Anemia: has been taking PO iron off and on Causes constipation Eating a lot of ice Continues to have regular periods, followed by gyn Also with fibroids Says periods have been heavier since eliquis No blood in stool, stools have been dark but taking iron  Anxiety: stable, ongoing symptoms Taking xanax 3 times a day Started on cymbalta yesterday, picked up hasnt taken yet  Constipation: taking OTC meds, not helping Sometimes goes several days between stools  Relevant past medical, surgical, family and social history reviewed. Allergies and medications reviewed and updated. History  Smoking Status  . Former Smoker  . Packs/day: 0.25  . Years: 5.00  . Types: Cigarettes  . Quit date: 11/18/1989  Smokeless Tobacco  . Never Used   ROS: Per HPI   Objective:    BP 116/75   Pulse 96   Temp 98 F (36.7 C) (Oral)   Ht 5\' 2"  (1.575 m)   Wt 147 lb 6.4 oz (66.9 kg)   BMI 26.96 kg/m   Wt Readings from Last 3 Encounters:  04/25/17 147 lb 6.4 oz (66.9 kg)  04/24/17 147 lb (66.7 kg)  04/04/17 147 lb (66.7 kg)    Gen: NAD, alert, cooperative with exam, NCAT EYES: EOMI, no conjunctival injection, or no icterus ENT:  OP without erythema LYMPH: no cervical LAD CV: NRRR, normal S1/S2, no murmur, distal pulses 2+ b/l Resp: CTABL, no wheezes, normal  WOB Abd: +BS, soft, NTND Ext: No edema, warm  Neuro: Alert and oriented MSK: stiffness in hands with hand grip, equal strenght b/l hands, no redness, no synovitis in hands Psych: normal affect  Assessment & Plan:  Akaya was seen today for med problem f/u.  Diagnoses and all orders for this visit:  Constipation, unspecified constipation type Trial of below, will send in Rx if tolerates and helps -     linaclotide (LINZESS) 145 MCG CAPS capsule; Take 1 capsule (145 mcg total) by mouth daily before breakfast.  Neck pain Reviewed NCCSRS Has received Rx from ortho as well Gave Rx for #60 tabs with 1 refill of below Take 1-2 times a day Does help her with ADLs, sometimes bothers her most at night and helps with pain to allow sleep Must be seen for further refills -     acetaminophen-codeine (TYLENOL #3) 300-30 MG tablet; Take 1 tablet by mouth every 12 (twelve) hours as needed for moderate pain.  Hyperlipidemia LDL goal <100 Decrease to qod, may be contributing to leg pains -     pravastatin (PRAVACHOL) 20 MG tablet; Take 1 tablet (20 mg total) by mouth every other day.  GAD (generalized anxiety disorder) Stable  Cont below for now, will wean as able Starting cymbalta -     ALPRAZolam (XANAX) 0.5 MG tablet; Take 1 tablet (0.5 mg total) by  mouth 3 (three) times daily as needed for anxiety.  Microcytic Anemia Likely from heavy periods since being on eliquis. Has 3 weeks left Cont iron Recheck CBC next visit Has not noticed blood in stool -     ferrous sulfate 325 (65 FE) MG tablet; Take 1 tablet (325 mg total) by mouth every other day.  Follow up plan: As scheduled 4 weeks [name], MD [place]

## 2017-04-28 ENCOUNTER — Encounter: Payer: Self-pay | Admitting: Internal Medicine

## 2017-04-28 ENCOUNTER — Ambulatory Visit: Payer: BLUE CROSS/BLUE SHIELD

## 2017-04-28 ENCOUNTER — Ambulatory Visit: Payer: BLUE CROSS/BLUE SHIELD | Admitting: Pediatrics

## 2017-04-28 ENCOUNTER — Other Ambulatory Visit: Payer: BLUE CROSS/BLUE SHIELD | Admitting: Orthotics

## 2017-04-28 ENCOUNTER — Telehealth: Payer: Self-pay | Admitting: Allergy and Immunology

## 2017-04-28 DIAGNOSIS — M2550 Pain in unspecified joint: Secondary | ICD-10-CM

## 2017-04-28 DIAGNOSIS — R918 Other nonspecific abnormal finding of lung field: Secondary | ICD-10-CM

## 2017-04-28 DIAGNOSIS — J3089 Other allergic rhinitis: Secondary | ICD-10-CM | POA: Diagnosis not present

## 2017-04-28 NOTE — Telephone Encounter (Signed)
Patient is wanting lab results Patient has had labs from Dr Evette Doffing at [facility] - that she wants Dr Neldon Mc to see and compare to because she has been having severe joint pain

## 2017-04-28 NOTE — Telephone Encounter (Signed)
PA done for Chest CT with contrast and orders for labs and CT put in. Will send info with Lonn Georgia to [place] office for patient to pick up

## 2017-04-28 NOTE — Telephone Encounter (Signed)
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 Also lets add a ANCA w/reflex to her blood tests drawn on the 7th.

## 2017-04-28 NOTE — Telephone Encounter (Signed)
Patient advised of instructions will sign consent and copay for Nucala on Thurs. When she gets allergy injs and appt made for next Monday to start Nucala. Per Dr Neldon Mc go ahead with labs will put orders in for ANCA CCP and RA

## 2017-04-28 NOTE — Telephone Encounter (Signed)
I have spoken with patient and advised of results and Dr. Bruna Potter recommendations. She also had labs drawn with her PCP that she would like for you to review. She also wanted me to let you know that PCP stopped the Celexa and started the Cymbalta. Something about her joint pain is worsening.

## 2017-04-29 ENCOUNTER — Other Ambulatory Visit: Payer: Self-pay

## 2017-04-29 DIAGNOSIS — H1013 Acute atopic conjunctivitis, bilateral: Secondary | ICD-10-CM | POA: Diagnosis not present

## 2017-04-30 ENCOUNTER — Telehealth: Payer: Self-pay | Admitting: Pediatrics

## 2017-04-30 NOTE — Telephone Encounter (Signed)
Please advise 

## 2017-04-30 NOTE — Telephone Encounter (Signed)
No, it won't anticoagulate her blood if she only takes it once. Is she bleeding? Why does she ask?

## 2017-04-30 NOTE — Telephone Encounter (Signed)
Spoke to pt and she was wondering about dropping down to 1  Eliquis instead of 2 since she started her menses. Her flow is normal and she states it isn't any heavier than what it normally is. Advised pt to continue taking Eliquis as prescribed and to call back if her menses becomes abnormally heavy or she is completely saturating a pad in one hour. Pt voiced understanding. Are you ok with these recommendations or would you like me to call her back with further instructions?

## 2017-05-01 ENCOUNTER — Other Ambulatory Visit: Payer: Self-pay | Admitting: Pediatrics

## 2017-05-01 ENCOUNTER — Ambulatory Visit (INDEPENDENT_AMBULATORY_CARE_PROVIDER_SITE_OTHER): Payer: BLUE CROSS/BLUE SHIELD | Admitting: *Deleted

## 2017-05-01 DIAGNOSIS — K5900 Constipation, unspecified: Secondary | ICD-10-CM

## 2017-05-01 DIAGNOSIS — M2550 Pain in unspecified joint: Secondary | ICD-10-CM | POA: Diagnosis not present

## 2017-05-01 DIAGNOSIS — J4550 Severe persistent asthma, uncomplicated: Secondary | ICD-10-CM

## 2017-05-01 MED ORDER — LINACLOTIDE 145 MCG PO CAPS: 145.0000 ug | ORAL_CAPSULE | Freq: Every day | ORAL | 2 refills | Status: DC

## 2017-05-01 NOTE — Telephone Encounter (Signed)
I sent in linzess

## 2017-05-01 NOTE — Telephone Encounter (Signed)
Please advise 

## 2017-05-02 ENCOUNTER — Telehealth: Payer: Self-pay | Admitting: Pediatrics

## 2017-05-02 ENCOUNTER — Ambulatory Visit
Admission: RE | Admit: 2017-05-02 | Discharge: 2017-05-02 | Disposition: A | Discharge status: Home / Self Care | Payer: BLUE CROSS/BLUE SHIELD | Source: Ambulatory Visit | Attending: Allergy and Immunology | Admitting: Allergy and Immunology

## 2017-05-02 DIAGNOSIS — R918 Other nonspecific abnormal finding of lung field: Secondary | ICD-10-CM | POA: Diagnosis not present

## 2017-05-02 DIAGNOSIS — K5900 Constipation, unspecified: Secondary | ICD-10-CM

## 2017-05-02 LAB — CYCLIC CITRUL PEPTIDE ANTIBODY, IGG: Cyclic Citrullin Peptide Ab: <16 Units

## 2017-05-02 LAB — ANCA SCREEN W REFLEX TITER: ANCA Screen: NEGATIVE

## 2017-05-02 LAB — RHEUMATOID FACTOR: Rhuematoid fact SerPl-aCnc: <14 IU/mL (ref <14)

## 2017-05-02 MED ORDER — IOPAMIDOL (ISOVUE-300) INJECTION 61%
75.0000 mL | Freq: Once | INTRAVENOUS | Status: AC | PRN
  Administered 2017-05-02: 75 mL via INTRAVENOUS

## 2017-05-02 MED ORDER — LINACLOTIDE 145 MCG PO CAPS: 145.0000 ug | ORAL_CAPSULE | Freq: Every day | ORAL | 2 refills | Status: DC

## 2017-05-02 NOTE — Telephone Encounter (Signed)
Left detailed message for pt 

## 2017-05-05 ENCOUNTER — Ambulatory Visit: Payer: BLUE CROSS/BLUE SHIELD

## 2017-05-05 ENCOUNTER — Telehealth: Payer: Self-pay | Admitting: *Deleted

## 2017-05-05 ENCOUNTER — Ambulatory Visit (INDEPENDENT_AMBULATORY_CARE_PROVIDER_SITE_OTHER): Payer: BLUE CROSS/BLUE SHIELD | Admitting: *Deleted

## 2017-05-05 ENCOUNTER — Other Ambulatory Visit: Payer: BLUE CROSS/BLUE SHIELD | Admitting: Orthotics

## 2017-05-05 DIAGNOSIS — R093 Abnormal sputum: Secondary | ICD-10-CM

## 2017-05-05 DIAGNOSIS — J309 Allergic rhinitis, unspecified: Secondary | ICD-10-CM

## 2017-05-05 NOTE — Telephone Encounter (Signed)
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 Horses are not in her vial an patient would like to know if this can be added. Please advise.

## 2017-05-05 NOTE — Telephone Encounter (Signed)
Patient would like to know if they said anything about black dust particles, and also would like to know if the blood clot is gone in the her lung? Also please check the note about her sputum culture from [place].  Please advise. Informed her of CT results.

## 2017-05-06 MED ORDER — MEPOLIZUMAB 100 MG ~~LOC~~ SOLR
100.0000 mg | SUBCUTANEOUS | Status: DC
  Administered 2017-05-05 – 2017-11-25 (×8): 100 mg via SUBCUTANEOUS

## 2017-05-06 NOTE — Progress Notes (Signed)
Immunotherapy   Patient Details  Name: Jennifer Mullins MRN: [id number] Date of Birth: November 23, 1970  05/06/2017  Fuller [place] Womack started injections for  Nucala. Frequency:once every 28 days. Epi-Pen:Epi-Pen Available  Consent signed and patient instructions given. No problems after 60 minutes in the office.    Constance Holster 05/06/2017, 5:25 PM

## 2017-05-06 NOTE — Progress Notes (Signed)
Immunotherapy   Patient Details  Name: Jennifer Mullins MRN: [id number] Date of Birth: Apr 16, 1971  05/01/2017  Fuller [place] Womack started injections for  [place]. Following schedule: B  Frequency:2 times per week Epi-Pen:Epi-Pen Available  Consent signed and patient instructions given. No problems after 30 minutes in the office.    Constance Holster 05/06/2017, 5:24 PM

## 2017-05-07 ENCOUNTER — Ambulatory Visit: Payer: BLUE CROSS/BLUE SHIELD | Admitting: Pediatrics

## 2017-05-07 DIAGNOSIS — J4550 Severe persistent asthma, uncomplicated: Secondary | ICD-10-CM | POA: Diagnosis not present

## 2017-05-07 DIAGNOSIS — H1013 Acute atopic conjunctivitis, bilateral: Secondary | ICD-10-CM | POA: Diagnosis not present

## 2017-05-07 NOTE — Telephone Encounter (Signed)
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 Is this the case for her?

## 2017-05-07 NOTE — Telephone Encounter (Signed)
Results received given to Jarrett Soho will take to Dr Neldon Mc to [facility] for Dr Neldon Mc to review.

## 2017-05-07 NOTE — Telephone Encounter (Signed)
Please set her up for a sputum culture for bacteria, fungus, mycobacterium and get a AFB sputum smear.

## 2017-05-07 NOTE — Telephone Encounter (Signed)
Please inform patient that there is no sputum culture in EPIC. Where are these results?

## 2017-05-07 NOTE — Telephone Encounter (Signed)
Dr Neldon Mc please advise patient states she got a sputum culture. Also she is coughing up black dust particles

## 2017-05-07 NOTE — Telephone Encounter (Signed)
Spoke to patient advised as written below

## 2017-05-07 NOTE — Telephone Encounter (Signed)
Spoke to patient for 10 min states that the pulmonary embolus is not a concern for her at this time that she is having that checked in July 2018. States she is more concerned about the dust cells in her lungs. States that this is what should be address please advise. Reviewed with her that a cytology study was done not a culture reviewed what this is. Patient is upset because that is not what should have been done. Dr Neldon Mc please advise what is the next step for her.

## 2017-05-07 NOTE — Telephone Encounter (Signed)
Please note that there is NO CULTURE IN EPIC. What you have provided is cytology but no culture. Please let patient know about this issue

## 2017-05-08 ENCOUNTER — Other Ambulatory Visit: Payer: Self-pay | Admitting: Nurse Practitioner

## 2017-05-08 ENCOUNTER — Ambulatory Visit (INDEPENDENT_AMBULATORY_CARE_PROVIDER_SITE_OTHER): Payer: BLUE CROSS/BLUE SHIELD | Admitting: *Deleted

## 2017-05-08 DIAGNOSIS — J309 Allergic rhinitis, unspecified: Secondary | ICD-10-CM | POA: Diagnosis not present

## 2017-05-08 NOTE — Addendum Note (Signed)
Addended by: Angelica Ran on: 05/08/2017 11:10 AM   Modules accepted: Orders

## 2017-05-08 NOTE — Telephone Encounter (Signed)
Patient informed to go and get sputum culture. Lab forms given while in office for injection.

## 2017-05-12 ENCOUNTER — Ambulatory Visit (INDEPENDENT_AMBULATORY_CARE_PROVIDER_SITE_OTHER): Payer: BLUE CROSS/BLUE SHIELD | Admitting: *Deleted

## 2017-05-12 ENCOUNTER — Other Ambulatory Visit: Payer: Self-pay | Admitting: Allergy and Immunology

## 2017-05-12 DIAGNOSIS — J309 Allergic rhinitis, unspecified: Secondary | ICD-10-CM | POA: Diagnosis not present

## 2017-05-12 DIAGNOSIS — R093 Abnormal sputum: Secondary | ICD-10-CM | POA: Diagnosis not present

## 2017-05-14 LAB — RESPIRATORY CULTURE OR RESPIRATORY AND SPUTUM CULTURE
Gram Stain: NONE SEEN
Organism ID, Bacteria: NORMAL

## 2017-05-15 ENCOUNTER — Telehealth: Payer: Self-pay | Admitting: *Deleted

## 2017-05-15 ENCOUNTER — Other Ambulatory Visit: Payer: Self-pay | Admitting: Pediatrics

## 2017-05-15 DIAGNOSIS — E785 Hyperlipidemia, unspecified: Secondary | ICD-10-CM

## 2017-05-15 NOTE — Telephone Encounter (Signed)
-----   Message from Jiles Prows, MD sent at 05/15/2017 10:16 AM EDT ----- Please inform patient that sputum culture DID NOT grow anything bad.

## 2017-05-15 NOTE — Telephone Encounter (Signed)
Patient would like to know what it could be that she is coughing up? She is very concerned about "black dust particles" that she was told she had in her lungs. Does she need to go back to the ENT? Please advise.

## 2017-05-19 ENCOUNTER — Ambulatory Visit: Payer: BLUE CROSS/BLUE SHIELD | Admitting: Orthotics

## 2017-05-20 ENCOUNTER — Other Ambulatory Visit: Payer: Self-pay | Admitting: Pediatrics

## 2017-05-20 ENCOUNTER — Ambulatory Visit (INDEPENDENT_AMBULATORY_CARE_PROVIDER_SITE_OTHER): Payer: BLUE CROSS/BLUE SHIELD | Admitting: *Deleted

## 2017-05-20 DIAGNOSIS — J309 Allergic rhinitis, unspecified: Secondary | ICD-10-CM

## 2017-05-20 DIAGNOSIS — F411 Generalized anxiety disorder: Secondary | ICD-10-CM

## 2017-05-27 ENCOUNTER — Ambulatory Visit (INDEPENDENT_AMBULATORY_CARE_PROVIDER_SITE_OTHER): Payer: BLUE CROSS/BLUE SHIELD | Admitting: *Deleted

## 2017-05-27 ENCOUNTER — Institutional Professional Consult (permissible substitution): Payer: Disability Insurance | Admitting: Internal Medicine

## 2017-05-27 DIAGNOSIS — J309 Allergic rhinitis, unspecified: Secondary | ICD-10-CM

## 2017-05-27 DIAGNOSIS — H1013 Acute atopic conjunctivitis, bilateral: Secondary | ICD-10-CM | POA: Diagnosis not present

## 2017-05-28 ENCOUNTER — Encounter: Payer: Self-pay | Admitting: Pediatrics

## 2017-05-28 ENCOUNTER — Ambulatory Visit (INDEPENDENT_AMBULATORY_CARE_PROVIDER_SITE_OTHER): Payer: BLUE CROSS/BLUE SHIELD | Admitting: Pediatrics

## 2017-05-28 ENCOUNTER — Telehealth: Payer: Self-pay | Admitting: Pediatrics

## 2017-05-28 VITALS — BP 125/87 | HR 80 | Temp 97.9°F | Ht 62.0 in | Wt 148.8 lb

## 2017-05-28 DIAGNOSIS — E785 Hyperlipidemia, unspecified: Secondary | ICD-10-CM

## 2017-05-28 DIAGNOSIS — K5900 Constipation, unspecified: Secondary | ICD-10-CM

## 2017-05-28 DIAGNOSIS — E282 Polycystic ovarian syndrome: Secondary | ICD-10-CM | POA: Diagnosis not present

## 2017-05-28 DIAGNOSIS — F411 Generalized anxiety disorder: Secondary | ICD-10-CM | POA: Diagnosis not present

## 2017-05-28 DIAGNOSIS — M542 Cervicalgia: Secondary | ICD-10-CM | POA: Diagnosis not present

## 2017-05-28 DIAGNOSIS — D649 Anemia, unspecified: Secondary | ICD-10-CM | POA: Diagnosis not present

## 2017-05-28 DIAGNOSIS — L680 Hirsutism: Secondary | ICD-10-CM | POA: Diagnosis not present

## 2017-05-28 DIAGNOSIS — M2550 Pain in unspecified joint: Secondary | ICD-10-CM

## 2017-05-28 DIAGNOSIS — R7303 Prediabetes: Secondary | ICD-10-CM | POA: Diagnosis not present

## 2017-05-28 LAB — BAYER DCA HB A1C WAIVED: HB A1C (BAYER DCA - WAIVED): 5.2 % (ref <7.0)

## 2017-05-28 MED ORDER — EFLORNITHINE HCL 13.9 % EX CREA: 1.0000 "application " | TOPICAL_CREAM | Freq: Two times a day (BID) | CUTANEOUS | 1 refills | Status: DC

## 2017-05-28 MED ORDER — LINACLOTIDE 290 MCG PO CAPS: 290.0000 ug | ORAL_CAPSULE | Freq: Every day | ORAL | 2 refills | Status: DC

## 2017-05-28 MED ORDER — DULOXETINE HCL 40 MG PO CPEP: 40.0000 mg | ORAL_CAPSULE | Freq: Every day | ORAL | 3 refills | Status: DC

## 2017-05-28 MED ORDER — METFORMIN HCL 500 MG PO TABS: 500.0000 mg | ORAL_TABLET | Freq: Every day | ORAL | 2 refills | Status: DC

## 2017-05-28 MED ORDER — ACETAMINOPHEN-CODEINE #3 300-30 MG PO TABS: 1.0000 | ORAL_TABLET | Freq: Two times a day (BID) | ORAL | 2 refills | Status: DC | PRN

## 2017-05-28 MED ORDER — ALPRAZOLAM 0.5 MG PO TABS: 0.5000 mg | ORAL_TABLET | Freq: Three times a day (TID) | ORAL | 2 refills | Status: DC | PRN

## 2017-05-28 NOTE — Patient Instructions (Signed)
Thyroid nodules: follow up with endocrine  Anxiety: increase to 40mg  cymbalta, continue alprazolam as needed  Neck Pain: gentle range of motion exercises as we discussed Tylenol with codeine no more than twice a day, goal to be off of it by next appointment with me in 3 months Codeine is also contributing to constipation  Constipation: increase linzess to 290 Iron no more than every other day  Stop eliquis

## 2017-05-28 NOTE — Progress Notes (Signed)
Subjective:   Patient ID: Jennifer Mullins, female    DOB: July 06, 1971, 46 y.o.   MRN: [id number] CC: Follow-up (2 month) multiple med problems HPI: Jennifer Mullins is a 46 y.o. female presenting for Follow-up (2 month)  Asthma: taking symbicort daily Getting allergy shots  Anxiety: taking xanax 0.5mg  TID, helping with symptoms  Fibromyalgia: started cymbalta 30mg , thinks is helping some Agreeable to increasing  Taking tylenol with codeine daily Helps with neck pain, also with pain in legs and back Able to be more active with the codeine  PE: provoked, now finished with 6 mo eliquis, will stop now  Iron def anemia: heavy periods since being on eliquis, taking iron replacement  Constipation: still some symptoms, taking linzess 172mcg daily  Productive cough: has had some dark colored specks in sputum past few months since dx with PE. Thinks is improving, used to be throughout the day, now just in the morning  Relevant past medical, surgical, family and social history reviewed. Allergies and medications reviewed and updated. History  Smoking Status  . Former Smoker  . Packs/day: 0.25  . Years: 5.00  . Types: Cigarettes  . Quit date: 11/18/1989  Smokeless Tobacco  . Never Used   ROS: Per HPI   Objective:    BP 125/87   Pulse 80   Temp 97.9 F (36.6 C) (Oral)   Ht 5\' 2"  (1.575 m)   Wt 148 lb 12.8 oz (67.5 kg)   LMP 04/29/2017   BMI 27.22 kg/m   Wt Readings from Last 3 Encounters:  05/28/17 148 lb 12.8 oz (67.5 kg)  04/25/17 147 lb 6.4 oz (66.9 kg)  04/24/17 147 lb (66.7 kg)    Gen: NAD, alert, cooperative with exam, NCAT EYES: EOMI, no conjunctival injection, or no icterus ENT: OP without erythema LYMPH: no cervical LAD CV: NRRR, normal S1/S2, no murmur, distal pulses 2+ b/l Resp: CTABL, no wheezes, normal WOB Abd: +BS, soft, NTND.  Ext: No edema, warm Neuro: Alert and oriented MSK: decreased ROM in neck with turning head side to side, tilting side to  side  Assessment & Plan:  Jennifer Mullins was seen today for follow-up.  Diagnoses and all orders for this visit:  Prediabetes A1c 5.1 On metformin BID Discussed stopping med, pt wants to cont 500mg  daily -     Bayer DCA Hb A1c Waived -     Lipid panel  Hyperlipidemia LDL goal <100 Stable, on pravastatin qod -     Lipid panel  Neck pain Cont below, must be seen for refills Codeine improves ability to do ADLs for now, goal to stop codeine over next two months -     acetaminophen-codeine (TYLENOL #3) 300-30 MG tablet; Take 1 tablet by mouth every 12 (twelve) hours as needed for moderate pain.  GAD (generalized anxiety disorder) Stable Increasing cymbalta Cont xanax 0.5mg  TID as needed -     ALPRAZolam (XANAX) 0.5 MG tablet; Take 1 tablet (0.5 mg total) by mouth 3 (three) times daily as needed for anxiety.  Constipation, unspecified constipation type Discussed symptom care, increase to 290 -     linaclotide (LINZESS) 290 MCG CAPS capsule; Take 1 capsule (290 mcg total) by mouth daily before breakfast.  Fibromyalgia Increase to 40mg  -     DULoxetine HCl 40 MG CPEP; Take 40 mg by mouth daily.  PCOS (polycystic ovarian syndrome) Decrease to once daily, consider DC next visit if BGLs at home remain low -     metFORMIN (GLUCOPHAGE) 500  MG tablet; Take 1 tablet (500 mg total) by mouth daily with breakfast.  Anemia, unspecified type Was on eliquis, stopping today. having heavy periods Cont iron Recheck CBC -     CBC with Differential/Platelet  Hirsutism Below as needed -     Eflornithine HCl 13.9 % cream; Apply 1 application topically 2 (two) times daily with a meal.   Follow up plan: Return in about 3 months (around 08/28/2017) for med follow up. [name], MD [place]

## 2017-05-29 LAB — CBC WITH DIFFERENTIAL/PLATELET
Basophils Absolute: 0 10*3/uL (ref 0.0–0.2)
Basos: 0 %
EOS (ABSOLUTE): 0 10*3/uL (ref 0.0–0.4)
Eos: 0 %
Hematocrit: 27.4 % — ABNORMAL LOW (ref 34.0–46.6)
Hemoglobin: 8.2 g/dL — CL (ref 11.1–15.9)
Immature Grans (Abs): 0 10*3/uL (ref 0.0–0.1)
Immature Granulocytes: 0 %
Lymphocytes Absolute: 1.3 10*3/uL (ref 0.7–3.1)
Lymphs: 35 %
MCH: 22.4 pg — ABNORMAL LOW (ref 26.6–33.0)
MCHC: 29.9 g/dL — ABNORMAL LOW (ref 31.5–35.7)
MCV: 75 fL — ABNORMAL LOW (ref 79–97)
Monocytes Absolute: 0.2 10*3/uL (ref 0.1–0.9)
Monocytes: 6 %
Neutrophils Absolute: 2.2 10*3/uL (ref 1.4–7.0)
Neutrophils: 59 %
Platelets: 267 10*3/uL (ref 150–379)
RBC: 3.66 x10E6/uL — ABNORMAL LOW (ref 3.77–5.28)
RDW: 18.4 % — ABNORMAL HIGH (ref 12.3–15.4)
WBC: 3.7 10*3/uL (ref 3.4–10.8)

## 2017-05-29 LAB — LIPID PANEL
Chol/HDL Ratio: 3.1 ratio (ref 0.0–4.4)
Cholesterol, Total: 172 mg/dL (ref 100–199)
HDL: 55 mg/dL (ref >39)
LDL Calculated: 93 mg/dL (ref 0–99)
Triglycerides: 118 mg/dL (ref 0–149)
VLDL Cholesterol Cal: 24 mg/dL (ref 5–40)

## 2017-05-30 ENCOUNTER — Other Ambulatory Visit: Payer: Self-pay | Admitting: Pediatrics

## 2017-05-30 DIAGNOSIS — D649 Anemia, unspecified: Secondary | ICD-10-CM

## 2017-06-02 ENCOUNTER — Telehealth: Payer: Self-pay | Admitting: Podiatry

## 2017-06-04 ENCOUNTER — Other Ambulatory Visit: Payer: Self-pay | Admitting: Pediatrics

## 2017-06-04 ENCOUNTER — Ambulatory Visit: Payer: BLUE CROSS/BLUE SHIELD | Admitting: Orthotics

## 2017-06-04 DIAGNOSIS — M542 Cervicalgia: Secondary | ICD-10-CM

## 2017-06-04 DIAGNOSIS — E282 Polycystic ovarian syndrome: Secondary | ICD-10-CM

## 2017-06-10 ENCOUNTER — Other Ambulatory Visit: Payer: Self-pay | Admitting: Allergy and Immunology

## 2017-06-10 ENCOUNTER — Ambulatory Visit: Payer: BLUE CROSS/BLUE SHIELD

## 2017-06-10 ENCOUNTER — Ambulatory Visit (INDEPENDENT_AMBULATORY_CARE_PROVIDER_SITE_OTHER): Payer: BLUE CROSS/BLUE SHIELD | Admitting: Allergy and Immunology

## 2017-06-10 ENCOUNTER — Encounter: Payer: Self-pay | Admitting: Allergy and Immunology

## 2017-06-10 ENCOUNTER — Other Ambulatory Visit: Payer: Self-pay | Admitting: Pediatrics

## 2017-06-10 VITALS — BP 126/78 | HR 84 | Resp 20

## 2017-06-10 DIAGNOSIS — J3089 Other allergic rhinitis: Secondary | ICD-10-CM

## 2017-06-10 DIAGNOSIS — K219 Gastro-esophageal reflux disease without esophagitis: Secondary | ICD-10-CM

## 2017-06-10 DIAGNOSIS — H1045 Other chronic allergic conjunctivitis: Secondary | ICD-10-CM | POA: Diagnosis not present

## 2017-06-10 DIAGNOSIS — R51 Headache: Secondary | ICD-10-CM

## 2017-06-10 DIAGNOSIS — J4540 Moderate persistent asthma, uncomplicated: Secondary | ICD-10-CM | POA: Diagnosis not present

## 2017-06-10 DIAGNOSIS — D649 Anemia, unspecified: Secondary | ICD-10-CM

## 2017-06-10 DIAGNOSIS — J4550 Severe persistent asthma, uncomplicated: Secondary | ICD-10-CM | POA: Diagnosis not present

## 2017-06-10 DIAGNOSIS — H1010 Acute atopic conjunctivitis, unspecified eye: Secondary | ICD-10-CM

## 2017-06-10 DIAGNOSIS — H1013 Acute atopic conjunctivitis, bilateral: Secondary | ICD-10-CM | POA: Diagnosis not present

## 2017-06-10 DIAGNOSIS — R519 Headache, unspecified: Secondary | ICD-10-CM

## 2017-06-10 MED ORDER — AUVI-Q 0.3 MG/0.3ML IJ SOAJ: INTRAMUSCULAR | 3 refills | Status: DC

## 2017-06-10 NOTE — Patient Instructions (Addendum)
  1. Continue Immunotherapy and Epi-Pen / Auvi-Q  2. Continue to Treat and prevent inflammation:   A. Symbicort 160 - 2 inhalations twice a day  B. Flonase - one spray each nostril twice a day  C. montelukast - one tablet one time per day  D. Nucala injections every 4 weeks  3. Continue to Treat and prevent reflux:   A. pantoprazole 40 mg in a.m.  B. famotidine 20 mg or ranitidine 300 mg in PM  4. Treat and prevent headaches:   A. RESTART cyproheptadine 4 mg tablet at bedtime  5. If needed:   A. Patanase 2 sprays each nostril two times per day  B. OTC antihistamine  C. Proventil HFA or similar 2 inhalations every 4-6 hours  D. hydroxyzine  6. Blood - anemia profile B, hemoglobinopathy profile  7. Continue eye therapy with Dr. Delman Cheadle.  8. Return to clinic in 12 weeks or earlier if problem  9. Obtain fall flu vaccine

## 2017-06-10 NOTE — Progress Notes (Signed)
Follow-up Note  Referring Provider: Eustaquio Maize, MD Primary Provider: Eustaquio Maize, MD Date of Office Visit: 06/10/2017  Subjective:   Jennifer Mullins (DOB: 1971/07/25) is a 46 y.o. female who returns to the [facility] [place] on 06/10/2017 in re-evaluation of the following:  HPI: Jennifer Mullins returns to this clinic in reevaluation of her asthma and allergic rhinoconjunctivitis and chronic cephalgia and reflux. I last saw her in this clinic May 2018.  She believes that her asthma is under excellent control at this point in time. She rarely uses a short acting bronchodilator. She has consolidated her Symbicort to 1 time per day. She still makes these "black specks" if she does make any sputum. As noted below she has had evaluation for this issue without a specific diagnosis. She is now using [place] for this issue.  Her nose has been doing well. She has not been having any problems with her ears. Her eye issue is still being addressed by Dr. 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 She stopped her prednisone eyedrops about a week ago or so and did well until the past several days at which point in time her eyes are starting to sting. She has an appointment to see Dr. Girtha Rm today. She is now using immunotherapy for this issue.  She informs me that her hemoglobin is around 8. She had discontinuation of her anticoagulant on 04/28/2017. She is not sure that she has ever had evaluation for a significant clotting disorder.  Jennifer Mullins has had an issue with itchiness over the course of the past several days for which she has taken an increased dose of her chronic hydroxyzine and increased her alprazolam. There does not appear to be any obvious provoking factor giving rise to this issue. She did have her linaclotide doubled in dose on July 11 and she did start Cymbalta proximally one month ago. There are no associated systemic or constitutional symptoms.  Her headaches are under  very good control and she has tapered off her cyproheptadine over the past several weeks.  Allergies as of 06/10/2017      Reactions   Demerol [meperidine] Shortness Of Breath   FLUSHING AND SHORTNESS OF BREATH   Morphine And Related Shortness Of Breath   Flushed and hot hyper   Diltiazem Hcl Hives   Lisinopril    Caused cough   Tramadol Other (See Comments)   Tears up stomach      Medication List      acetaminophen-codeine 300-30 MG tablet Commonly known as:  TYLENOL #3 Take 1 tablet by mouth every 12 (twelve) hours as needed for moderate pain.   ALPRAZolam 0.5 MG tablet Commonly known as:  XANAX Take 1 tablet (0.5 mg total) by mouth 3 (three) times daily as needed for anxiety.   budesonide-formoterol 160-4.5 MCG/ACT inhaler Commonly known as:  SYMBICORT Inhale 2 puffs into the lungs 2 (two) times daily.   cromolyn 4 % ophthalmic solution Commonly known as:  OPTICROM   cyproheptadine 4 MG tablet Commonly known as:  PERIACTIN TAKE 1 TABLET BY MOUTH AT BEDTIME   diclofenac 0.1 % ophthalmic solution Commonly known as:  VOLTAREN   DULoxetine HCl 40 MG Cpep Commonly known as:  CYMBALTA Take 40 mg by mouth daily.   Eflornithine HCl 13.9 % cream Apply 1 application topically 2 (two) times daily with a meal.   ferrous sulfate 325 (65 FE) MG tablet Take 1 tablet (325 mg total) by mouth every  other day.   fluticasone 50 MCG/ACT nasal spray Commonly known as:  FLONASE Place 1 spray into both nostrils 2 (two) times daily.   furosemide 20 MG tablet Commonly known as:  LASIX Take 1 tablet (20 mg total) by mouth daily.   hydrOXYzine 25 MG tablet Commonly known as:  ATARAX/VISTARIL TAKE 1 OR 2 TABLETS BY MOUTH THREE TIMES DAILY   ipratropium 0.03 % nasal spray Commonly known as:  ATROVENT Place 2 sprays into both nostrils every 12 (twelve) hours.   linaclotide 290 MCG Caps capsule Commonly known as:  LINZESS Take 1 capsule (290 mcg total) by mouth daily before  breakfast.   losartan 25 MG tablet Commonly known as:  COZAAR Take 1 tablet (25 mg total) by mouth daily.   metFORMIN 500 MG tablet Commonly known as:  GLUCOPHAGE Take 1 tablet (500 mg total) by mouth daily with breakfast.   montelukast 10 MG tablet Commonly known as:  SINGULAIR Take 1 tablet (10 mg total) by mouth at bedtime.   Olopatadine HCl 0.6 % Soln 2 sprays each nostril two times per day.   pantoprazole 40 MG tablet Commonly known as:  PROTONIX TAKE 1 TABLET BY MOUTH EVERY DAY   polyethylene glycol packet Commonly known as:  MIRALAX / GLYCOLAX Take 17 g by mouth daily as needed for mild constipation.   pravastatin 20 MG tablet Commonly known as:  PRAVACHOL Take 1 tablet (20 mg total) by mouth every other day.   albuterol (2.5 MG/3ML) 0.083% nebulizer solution Commonly known as:  PROVENTIL Take 2.5 mg by nebulization every 6 (six) hours as needed for wheezing or shortness of breath.   PROAIR HFA 108 (90 Base) MCG/ACT inhaler Generic drug:  albuterol INHALE 1-2 PUFFS EVERY FOUR HOURS AS NEEDED WHEEZING SHORTNESS OF BREATH       Past Medical History:  Diagnosis Date  . Anemia   . Anxiety   . Arthritis   . Asthma   . Bilateral calcaneal spurs   . Bilateral polycystic ovarian syndrome   . Bulging lumbar disc   . Carpal tunnel syndrome, bilateral   . Conjunctivitis   . COPD (chronic obstructive pulmonary disease) (HCC)    BRONCHITIS  . Diabetes mellitus without complication ([place])    TYPE 2   DX  4-5 YRS AGO  . ETD (Eustachian tube dysfunction), bilateral    left worse than right  . Fibroids    uterine  . GAD (generalized anxiety disorder)   . GERD (gastroesophageal reflux disease)    TAKES PRESCRIPTION MEDS  . Glaucoma    BOTH EYES  . Hematoma    Hematoma of Left Hand- had to wear a split  . Hyperlipidemia   . Hypertension   . Hypothyroidism    NODULES ON THYROID  . PONV (postoperative nausea and vomiting)   . Pulmonary embolism ([place])   .  Tachycardia   . Trigger finger   . Trigger finger of left thumb     Past Surgical History:  Procedure Laterality Date  . ANTERIOR CERVICAL DECOMP/DISCECTOMY FUSION N/A 11/13/2016   Procedure: Cervical five-six, Cervical six-seven Anterior Cervical Discectomy and Fusion, Allograft, Plate;  Surgeon: Marybelle Killings, MD;  Location: [facility];  Service: Orthopedics;  Laterality: N/A;  . BREAST SURGERY    . CARPAL TUNNEL RELEASE    . DILATION AND CURETTAGE OF UTERUS    . EXPLORATORY LAPAROTOMY    . NASAL SINUS SURGERY  07/22/13   Dr. Redmond Pulling in [place]  Review of systems negative except as noted in HPI / PMHx or noted below:  Review of Systems  Constitutional: Negative.   HENT: Negative.   Eyes: Negative.   Respiratory: Negative.   Cardiovascular: Negative.   Gastrointestinal: Negative.   Genitourinary: Negative.   Musculoskeletal: Negative.   Skin: Negative.   Neurological: Negative.   Endo/Heme/Allergies: Negative.   Psychiatric/Behavioral: Negative.      Objective:   Vitals:   06/10/17 1012  BP: 126/78  Pulse: 84  Resp: 20          Physical Exam  Constitutional: She is well-developed, well-nourished, and in no distress.  HENT:  Head: Normocephalic.  Right Ear: Tympanic membrane, external ear and ear canal normal.  Left Ear: Tympanic membrane, external ear and ear canal normal.  Nose: Nose normal. No mucosal edema or rhinorrhea.  Mouth/Throat: Uvula is midline, oropharynx is clear and moist and mucous membranes are normal. No oropharyngeal exudate.  Eyes: Right conjunctiva is injected. Left conjunctiva is injected.  Neck: Trachea normal. No tracheal tenderness present. No tracheal deviation present. No thyromegaly present.  Cardiovascular: Normal rate, regular rhythm, S1 normal, S2 normal and normal heart sounds.   No murmur heard. Pulmonary/Chest: Breath sounds normal. No stridor. No respiratory distress. She has no wheezes. She has no rales.  Musculoskeletal: She  exhibits no edema.  Lymphadenopathy:       Head (right side): No tonsillar adenopathy present.       Head (left side): No tonsillar adenopathy present.    She has no cervical adenopathy.  Neurological: She is alert. Gait normal.  Skin: No rash noted. She is not diaphoretic. No erythema. Nails show no clubbing.  Psychiatric: Mood and affect normal.    Diagnostics: Results of blood tests obtained 05/28/2017 identified a white blood cell count of 3.7 with an absolute eosinophil count of 0, hemoglobin 8.2 with an MCV of 75, platelet 267.  Results of blood tests obtained on 04/24/2017 identified a white blood cell count of 5.5 with an absolute eosinophil count of 300, hemoglobin 9.3, platelet 251.  Results of a sputum culture obtained 05/12/2017 identified no white blood cells, rare squamous epithelial cell, few gram-positive cocci in pairs, rare gram-negative rod, normal oropharyngeal flora  Results of a noncontrast chest CT scan obtained on 05/02/2017 identified the following partial result:  Lungs/Pleura: In the interval since prior CT, there has been marked clearing of consolidation from the inferior lingula. There is persistent mild scarring in this area. In the inferior aspect of the lingula along the periphery, there is a nodular opacity measuring 1.3 x 0.9 x 0.8 cm. This finding is best appreciated on axial slice 80 series 4 and coronal slice 52 series 580. This area is also seen on sagittal sequences 112 through 116 series 6 where it is less well-defined It has an appearance most consistent with rounded atelectasis. No similar nodular opacities are evident elsewhere. There is currently no edema or consolidation. No pleural effusion or pleural thickening.   Spirometry was performed and demonstrated an FEV1 of 1.90 at 90 % of predicted.  The patient had an Asthma Control Test with the following results: ACT Total Score: 17.    Assessment and Plan:   1. Anemia, unspecified type     2. Asthma, moderate persistent, well-controlled   3. Other allergic rhinitis   4. Seasonal allergic conjunctivitis   5. Headache disorder   6. LPRD (laryngopharyngeal reflux disease)     1. Continue Immunotherapy and Epi-Pen / Auvi-Q  2. Continue to Treat and prevent inflammation:   A. Symbicort 160 - 2 inhalations twice a day  B. Flonase - one spray each nostril twice a day  C. montelukast - one tablet one time per day  D. Nucala injections every 4 weeks  3. Continue to Treat and prevent reflux:   A. pantoprazole 40 mg in a.m.  B. famotidine 20 mg or ranitidine 300 mg in PM  4. Treat and prevent headaches:   A. RESTART cyproheptadine 4 mg tablet at bedtime  5. If needed:   A. Patanase 2 sprays each nostril two times per day  B. OTC antihistamine  C. Proventil HFA or similar 2 inhalations every 4-6 hours  D. hydroxyzine  6. Blood - anemia profile B, hemoglobinopathy profile  7. Continue eye therapy with Dr. Delman Cheadle.  8. Return to clinic in 12 weeks or earlier if problem  9. Obtain fall flu vaccine  Jennifer Mullins appears to be doing relatively well on her current medical therapy and nucala and immunotherapy. She still appears to have some active eye disease that will be addressed by Dr. Delman Cheadle. I did encourage her to restart her cyproheptadine every day as this will prevent her from developing headaches and also add some anti-itch effect as it is a rather strong antihistamine. I am going to obtain some blood tests to look at her microcytic anemia in more detail. Of all the blood tests that I have available for review she appears to always have had a hemoglobin somewhere below 10 which suggested possible hemoglobinopathy. I will explore what type of evaluation has been done for a possible clotting disorder given the fact that she did have a PE. If nothing has been done it may be worthwhile to obtain some screening tests for a clotting disorder. I will see her back in this clinic in 12  weeks or earlier if there is a problem.  Allena Katz, MD Allergy / Immunology [place]

## 2017-06-11 LAB — ANEMIA PROFILE B
%SAT: 4 % — ABNORMAL LOW (ref 11–50)
ABS Retic: 74880 cells/uL (ref 20000–80000)
Basophils Absolute: 0 cells/uL (ref 0–200)
Basophils Relative: 0 %
Eosinophils Absolute: 0 cells/uL — ABNORMAL LOW (ref 15–500)
Eosinophils Relative: 0 %
Ferritin: 7 ng/mL — ABNORMAL LOW (ref 10–232)
Folate: 17.1 ng/mL (ref >5.4)
HCT: 30.6 % — ABNORMAL LOW (ref 35.0–45.0)
Hemoglobin: 9.5 g/dL — ABNORMAL LOW (ref 11.7–15.5)
Iron: 18 ug/dL — ABNORMAL LOW (ref 40–190)
Lymphocytes Relative: 35 %
Lymphs Abs: 1575 cells/uL (ref 850–3900)
MCH: 22.8 pg — ABNORMAL LOW (ref 27.0–33.0)
MCHC: 31 g/dL — ABNORMAL LOW (ref 32.0–36.0)
MCV: 73.6 fL — ABNORMAL LOW (ref 80.0–100.0)
MPV: 9.9 fL (ref 7.5–12.5)
Monocytes Absolute: 405 cells/uL (ref 200–950)
Monocytes Relative: 9 %
Neutro Abs: 2520 cells/uL (ref 1500–7800)
Neutrophils Relative %: 56 %
Platelets: 306 10*3/uL (ref 140–400)
RBC.: 4.16 MIL/uL (ref 3.80–5.10)
RBC: 4.16 MIL/uL (ref 3.80–5.10)
RDW: 18.3 % — ABNORMAL HIGH (ref 11.0–15.0)
Retic Ct Pct: 1.8 %
TIBC: 496 ug/dL — ABNORMAL HIGH (ref 250–450)
Vitamin B-12: 652 pg/mL (ref 200–1100)
WBC: 4.5 10*3/uL (ref 3.8–10.8)

## 2017-06-13 ENCOUNTER — Ambulatory Visit (INDEPENDENT_AMBULATORY_CARE_PROVIDER_SITE_OTHER): Payer: BLUE CROSS/BLUE SHIELD

## 2017-06-13 ENCOUNTER — Telehealth: Payer: Self-pay | Admitting: Allergy and Immunology

## 2017-06-13 DIAGNOSIS — J309 Allergic rhinitis, unspecified: Secondary | ICD-10-CM | POA: Diagnosis not present

## 2017-06-13 LAB — HEMOGLOBINOPATHY EVALUATION
Hematocrit: 30.7 % — ABNORMAL LOW (ref 35.0–45.0)
Hemoglobin: 9.5 g/dL — ABNORMAL LOW (ref 11.7–15.5)
Hgb A2 Quant: 2 % (ref 1.8–3.5)
Hgb A: 97 % (ref >96.0)
Hgb F Quant: <1 % (ref <2.0)
MCH: 23.2 pg — ABNORMAL LOW (ref 27.0–33.0)
MCV: 74.9 fL — ABNORMAL LOW (ref 80.0–100.0)
RDW: 17 % — ABNORMAL HIGH (ref 11.0–15.0)
Red Blood Cell Count: 4.1 MIL/uL (ref 3.80–5.10)

## 2017-06-13 NOTE — Telephone Encounter (Signed)
Pt came in and ask about her labs to see if they have come in and would like to get a call.

## 2017-06-17 ENCOUNTER — Other Ambulatory Visit: Payer: BLUE CROSS/BLUE SHIELD | Admitting: Orthotics

## 2017-06-17 ENCOUNTER — Telehealth: Payer: Self-pay

## 2017-06-17 ENCOUNTER — Telehealth: Payer: Self-pay | Admitting: Pediatrics

## 2017-06-17 NOTE — Telephone Encounter (Signed)
Hg slightly up from when we checked which is good. Now that she is off of eliquis I expect this number to continue to improve, if not we will need to figure out why. Continue iron. Agree with recheck in 1 mo.

## 2017-06-17 NOTE — Telephone Encounter (Signed)
Pt given Dr Autumn Patty recommendation Verbalizes understanding

## 2017-06-17 NOTE — Telephone Encounter (Signed)
Spoke with Jennifer Mullins and inform her of what dr Raliegh Ip said

## 2017-06-17 NOTE — Telephone Encounter (Signed)
Should be fine as long as she does not develop stomach upset.

## 2017-06-17 NOTE — Telephone Encounter (Signed)
Spoke with pt and informed her of what dr Neldon Mc stated about the mulitvitamin with iron and she stated dr Britta Mccreedy has her taking a Iron supplement every other day. Is it ok for her to take the multivitamin with iron with the supplement?  Please advise

## 2017-06-18 ENCOUNTER — Telehealth: Payer: Self-pay | Admitting: Pediatrics

## 2017-06-18 ENCOUNTER — Encounter: Payer: Self-pay | Admitting: Pediatrics

## 2017-06-18 ENCOUNTER — Institutional Professional Consult (permissible substitution): Payer: BLUE CROSS/BLUE SHIELD | Admitting: Pulmonary Disease

## 2017-06-18 NOTE — Telephone Encounter (Signed)
Patient aware and asked me to read her the letter. States that the letter needs to say- She is followed at this office for primary care and her medical conditions from 2005 has progressed.   If approved we can re do letter.  Please advise.

## 2017-06-18 NOTE — Telephone Encounter (Signed)
Patient aware.

## 2017-06-18 NOTE — Telephone Encounter (Signed)
Please review and advise.

## 2017-06-18 NOTE — Telephone Encounter (Signed)
That's fine, I printed letter out. She should have one I wrote 02/06/2017 and one from [facility] 10/28/2016 as well.

## 2017-06-18 NOTE — Telephone Encounter (Signed)
Patient needs letter written for her disability to continue.  Has to have this done yearly. She states letter must say that she is currently under the care of several physicians and that her condition and medications prevent her from working.  Also needs to say her conditions have progressed through the years.  Please call when ready.

## 2017-06-19 ENCOUNTER — Ambulatory Visit (INDEPENDENT_AMBULATORY_CARE_PROVIDER_SITE_OTHER): Payer: BLUE CROSS/BLUE SHIELD | Admitting: *Deleted

## 2017-06-19 DIAGNOSIS — J309 Allergic rhinitis, unspecified: Secondary | ICD-10-CM | POA: Diagnosis not present

## 2017-06-20 ENCOUNTER — Other Ambulatory Visit: Payer: Self-pay | Admitting: Nurse Practitioner

## 2017-06-24 ENCOUNTER — Other Ambulatory Visit: Payer: Self-pay | Admitting: "Endocrinology

## 2017-06-24 ENCOUNTER — Encounter: Payer: Self-pay | Admitting: Allergy and Immunology

## 2017-06-24 ENCOUNTER — Ambulatory Visit (INDEPENDENT_AMBULATORY_CARE_PROVIDER_SITE_OTHER): Payer: BLUE CROSS/BLUE SHIELD | Admitting: Allergy and Immunology

## 2017-06-24 VITALS — BP 118/70 | HR 86 | Resp 18

## 2017-06-24 DIAGNOSIS — R7303 Prediabetes: Secondary | ICD-10-CM | POA: Diagnosis not present

## 2017-06-24 DIAGNOSIS — Q892 Congenital malformations of other endocrine glands: Secondary | ICD-10-CM | POA: Diagnosis not present

## 2017-06-24 DIAGNOSIS — H6982 Other specified disorders of Eustachian tube, left ear: Secondary | ICD-10-CM | POA: Diagnosis not present

## 2017-06-24 DIAGNOSIS — J4550 Severe persistent asthma, uncomplicated: Secondary | ICD-10-CM

## 2017-06-24 DIAGNOSIS — K219 Gastro-esophageal reflux disease without esophagitis: Secondary | ICD-10-CM | POA: Diagnosis not present

## 2017-06-24 DIAGNOSIS — D508 Other iron deficiency anemias: Secondary | ICD-10-CM | POA: Diagnosis not present

## 2017-06-24 DIAGNOSIS — Z86711 Personal history of pulmonary embolism: Secondary | ICD-10-CM

## 2017-06-24 DIAGNOSIS — R51 Headache: Secondary | ICD-10-CM

## 2017-06-24 DIAGNOSIS — H1010 Acute atopic conjunctivitis, unspecified eye: Secondary | ICD-10-CM

## 2017-06-24 DIAGNOSIS — J3089 Other allergic rhinitis: Secondary | ICD-10-CM | POA: Diagnosis not present

## 2017-06-24 DIAGNOSIS — H1045 Other chronic allergic conjunctivitis: Secondary | ICD-10-CM

## 2017-06-24 DIAGNOSIS — R519 Headache, unspecified: Secondary | ICD-10-CM

## 2017-06-24 DIAGNOSIS — E042 Nontoxic multinodular goiter: Secondary | ICD-10-CM | POA: Diagnosis not present

## 2017-06-24 LAB — COMPREHENSIVE METABOLIC PANEL
ALT: 9 U/L (ref 6–29)
AST: 14 U/L (ref 10–35)
Albumin: 4.2 g/dL (ref 3.6–5.1)
Alkaline Phosphatase: 89 U/L (ref 33–115)
BUN: 11 mg/dL (ref 7–25)
CO2: 24 mmol/L (ref 20–32)
Calcium: 9 mg/dL (ref 8.6–10.2)
Chloride: 103 mmol/L (ref 98–110)
Creat: 0.82 mg/dL (ref 0.50–1.10)
Glucose, Bld: 73 mg/dL (ref 65–99)
Potassium: 4.2 mmol/L (ref 3.5–5.3)
Sodium: 138 mmol/L (ref 135–146)
Total Bilirubin: 0.2 mg/dL (ref 0.2–1.2)
Total Protein: 6.8 g/dL (ref 6.1–8.1)

## 2017-06-24 LAB — TSH: TSH: 1.4 mIU/L

## 2017-06-24 LAB — T4, FREE: Free T4: 1 ng/dL (ref 0.8–1.8)

## 2017-06-24 MED ORDER — AMOXICILLIN-POT CLAVULANATE 875-125 MG PO TABS: 1.0000 | ORAL_TABLET | Freq: Two times a day (BID) | ORAL | 0 refills | Status: DC

## 2017-06-24 MED ORDER — FLUCONAZOLE 150 MG PO TABS: ORAL_TABLET | ORAL | 1 refills | Status: DC

## 2017-06-24 NOTE — Progress Notes (Signed)
Follow-up Note  Referring Provider: Eustaquio Maize, MD Primary Provider: Eustaquio Maize, MD Date of Office Visit: 06/24/2017  Subjective:   Jennifer Mullins (DOB: 03/28/1971) is a 46 y.o. female who returns to the [facility] on 06/24/2017 in re-evaluation of the following:  HPI: Jennifer Mullins presents to this clinic in evaluation of a syndrome that is developed over the course of the past several days. She is developed some issues with nasal congestion about 5 days ago without any fever or ugly nasal discharge but with some low-grade fever and some slight ear fullness. Significantly she has developed a knot under her chin that is popped up over the course the past 2 days.  Her other issues are going quite well. 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 She restarted her Periactin during her last visit which has resulted in dramatic improvement regarding her chronic headaches. She is undergoing therapy with Dr. Delman Mullins regarding her apparent maternal conjunctivitis. She is now using a multivitamin with iron for her iron deficiency anemia.  Allergies as of 06/24/2017      Reactions   Demerol [meperidine] Shortness Of Breath   FLUSHING AND SHORTNESS OF BREATH   Morphine And Related Shortness Of Breath   Flushed and hot hyper   Diltiazem Hcl Hives   Lisinopril    Caused cough   Tramadol Other (See Comments)   Tears up stomach      Medication List      acetaminophen-codeine 300-30 MG tablet Commonly known as:  TYLENOL #3 Take 1 tablet by mouth every 12 (twelve) hours as needed for moderate pain.   albuterol (2.5 MG/3ML) 0.083% nebulizer solution Commonly known as:  PROVENTIL Take 2.5 mg by nebulization every 6 (six) hours as needed for wheezing or shortness of breath.   PROAIR HFA 108 (90 Base) MCG/ACT inhaler Generic drug:  albuterol INHALE 1 TO 2 PUFFS EVERY FOUR HOURS AS NEEDED  WHEEZING SHORTNESS OF BREATH   ALPRAZolam 0.5 MG tablet Commonly known as:  XANAX Take 1 tablet (0.5 mg total) by mouth 3 (three) times daily as needed for anxiety.   AUVI-Q 0.3 mg/0.3 mL Soaj injection Generic drug:  EPINEPHrine Use as directed for life-threatening allergic reaction.   budesonide-formoterol 160-4.5 MCG/ACT inhaler Commonly known as:  SYMBICORT Inhale 2 puffs into the lungs 2 (two) times daily.   cromolyn 4 % ophthalmic solution Commonly known as:  OPTICROM   cyproheptadine 4 MG tablet Commonly known as:  PERIACTIN TAKE 1 TABLET BY MOUTH AT BEDTIME   diclofenac 0.1 % ophthalmic solution Commonly known as:  VOLTAREN   DULoxetine 30 MG capsule Commonly known as:  CYMBALTA   Eflornithine HCl 13.9 % cream Apply 1 application topically 2 (two) times daily with a meal.   famotidine 20 MG tablet Commonly known as:  PEPCID   ferrous sulfate 325 (65 FE) MG tablet Take 1 tablet (325 mg total) by mouth every other day.   fluconazole 150 MG tablet Commonly known as:  DIFLUCAN TAKE ONE TABLET BY MOUTH   fluticasone 50 MCG/ACT nasal spray Commonly known as:  FLONASE Place 1 spray into both nostrils 2 (two) times daily.   furosemide 20 MG tablet Commonly known as:  LASIX Take 1 tablet (20 mg total) by mouth daily.   hydrOXYzine 25 MG tablet Commonly known as:  ATARAX/VISTARIL TAKE 1 OR 2 TABLETS BY MOUTH THREE TIMES DAILY   ipratropium  0.03 % nasal spray Commonly known as:  ATROVENT Place 2 sprays into both nostrils every 12 (twelve) hours.   linaclotide 290 MCG Caps capsule Commonly known as:  LINZESS Take 1 capsule (290 mcg total) by mouth daily before breakfast.   losartan 25 MG tablet Commonly known as:  COZAAR Take 1 tablet (25 mg total) by mouth daily.   metFORMIN 500 MG tablet Commonly known as:  GLUCOPHAGE Take 1 tablet (500 mg total) by mouth daily with breakfast.   montelukast 10 MG tablet Commonly known as:  SINGULAIR Take 1 tablet  (10 mg total) by mouth at bedtime.   Olopatadine HCl 0.6 % Soln 2 sprays each nostril two times per day.   pantoprazole 40 MG tablet Commonly known as:  PROTONIX TAKE 1 TABLET BY MOUTH EVERY DAY   polyethylene glycol packet Commonly known as:  MIRALAX / GLYCOLAX Take 17 g by mouth daily as needed for mild constipation.   pravastatin 20 MG tablet Commonly known as:  PRAVACHOL Take 1 tablet (20 mg total) by mouth every other day.   prednisoLONE acetate 1 % ophthalmic suspension Commonly known as:  PRED FORTE       Past Medical History:  Diagnosis Date  . Anemia   . Anxiety   . Arthritis   . Asthma   . Bilateral calcaneal spurs   . Bilateral polycystic ovarian syndrome   . Bulging lumbar disc   . Carpal tunnel syndrome, bilateral   . Conjunctivitis   . COPD (chronic obstructive pulmonary disease) (HCC)    BRONCHITIS  . Diabetes mellitus without complication ([place])    TYPE 2   DX  4-5 YRS AGO  . ETD (Eustachian tube dysfunction), bilateral    left worse than right  . Fibroids    uterine  . GAD (generalized anxiety disorder)   . GERD (gastroesophageal reflux disease)    TAKES PRESCRIPTION MEDS  . Glaucoma    BOTH EYES  . Hematoma    Hematoma of Left Hand- had to wear a split  . Hyperlipidemia   . Hypertension   . Hypothyroidism    NODULES ON THYROID  . PONV (postoperative nausea and vomiting)   . Pulmonary embolism ([place])   . Tachycardia   . Trigger finger   . Trigger finger of left thumb     Past Surgical History:  Procedure Laterality Date  . ANTERIOR CERVICAL DECOMP/DISCECTOMY FUSION N/A 11/13/2016   Procedure: Cervical five-six, Cervical six-seven Anterior Cervical Discectomy and Fusion, Allograft, Plate;  Surgeon: Jennifer Killings, MD;  Location: [facility];  Service: Orthopedics;  Laterality: N/A;  . BREAST SURGERY    . CARPAL TUNNEL RELEASE    . DILATION AND CURETTAGE OF UTERUS    . EXPLORATORY LAPAROTOMY    . NASAL SINUS SURGERY  07/22/13   Dr. Redmond Mullins in  [place]    Review of systems negative except as noted in HPI / PMHx or noted below:  Review of Systems  Constitutional: Negative.   HENT: Negative.   Eyes: Negative.   Respiratory: Negative.   Cardiovascular: Negative.   Gastrointestinal: Negative.   Genitourinary: Negative.   Musculoskeletal: Negative.   Skin: Negative.   Neurological: Negative.   Endo/Heme/Allergies: Negative.   Psychiatric/Behavioral: Negative.      Objective:   Vitals:   06/24/17 1452  BP: 118/70  Pulse: 86  Resp: 18          Physical Exam  Constitutional: She is well-developed, well-nourished, and in no distress.  HENT:  Head: Normocephalic.  Right Ear: Tympanic membrane, external ear and ear canal normal.  Left Ear: Tympanic membrane, external ear and ear canal normal.  Nose: Nose normal. No mucosal edema or rhinorrhea.  Mouth/Throat: Uvula is midline, oropharynx is clear and moist and mucous membranes are normal. No oropharyngeal exudate.  Eyes: Conjunctivae are normal.  Neck: Trachea normal. No tracheal tenderness present. No tracheal deviation present. Thyromegaly present.  1 cm mobile nontender submental nodule  Cardiovascular: Normal rate, regular rhythm, S1 normal, S2 normal and normal heart sounds.   No murmur heard. Pulmonary/Chest: Breath sounds normal. No stridor. No respiratory distress. She has no wheezes. She has no rales.  Musculoskeletal: She exhibits no edema.  Lymphadenopathy:       Head (right side): No tonsillar adenopathy present.       Head (left side): No tonsillar adenopathy present.    She has no cervical adenopathy.  Neurological: She is alert. Gait normal.  Skin: No rash noted. She is not diaphoretic. No erythema. Nails show no clubbing.  Psychiatric: Mood and affect normal.    Diagnostics: Results of blood tests obtained 06/10/2017 identified a hemoglobin of 9.5 with an MCV of 73, white blood cell count 4.5 with 0 eosinophils, and platelet 306. Her ferritin was 7  nG/ML, iron 18 UG/DL, 97% hemoglobin A, 2% hemoglobin A2.   Spirometry was performed and demonstrated an FEV1 of 1.87 at 88 % of predicted.  The patient had an Asthma Control Test with the following results: ACT Total Score: 18.    Assessment and Plan:   1. Asthma, severe persistent, well-controlled   2. Other allergic rhinitis   3. Seasonal allergic conjunctivitis   4. Headache disorder   5. LPRD (laryngopharyngeal reflux disease)   6. ETD (Eustachian tube dysfunction), left   7. Other iron deficiency anemia   8. Thyroglossal duct cyst   9. History of pulmonary embolus (PE)     1. Continue Immunotherapy and Epi-Pen / Auvi-Q  2. Continue to Treat and prevent inflammation:   A. Symbicort 160 - 1-2 inhalations twice a day  B. Flonase - one spray each nostril twice a day  C. montelukast - one tablet one time per day  D. Mepolizumab injections every 4 weeks  3. Continue to Treat and prevent reflux:   A. pantoprazole 40 mg in a.m.  B. famotidine 20 mg or ranitidine 300 mg in PM  4. Continue to Treat and prevent headaches:   A. cyproheptadine 2-4 mg at bedtime  5. If needed:   A. Patanase 2 sprays each nostril two times per day  B. OTC antihistamine  C. Proventil HFA or similar 2 inhalations every 4-6 hours  D. hydroxyzine  6. Continue Iron supplementation and MVI. Check CBC end of month  7. Continue eye therapy with Dr. Delman Mullins.  8. For this recent issue:   A. Nasal saline several times per day  B. Augmentin 875 one tablet twice a day  9. Return to clinic in 12 weeks or earlier if problem  10. Obtain fall flu vaccine  11. Evaluation for clotting problem with hematologist  I think that Marsi has developed a respiratory tract infection and given the fact that her thyroglossal duct cyst has swollen up and is somewhat painful I suspect that this cyst is also infected and we will treat her with Augmentin. She has a collection of other issues going on which I attempted  to address as noted above including her severe eosinophilic driven respiratory  disease and her chronic cephalgia and her reflux and her iron deficiency anemia. I will have her evaluated by a hematologist in investigation of her clotting abnormality presenting as DVT and pulmonary embolus. I could not find any previous evaluation that has been completed in the past for this issue.  Allena Katz, MD [facility] / [facility]

## 2017-06-24 NOTE — Patient Instructions (Addendum)
  1. Continue Immunotherapy and Epi-Pen / Auvi-Q  2. Continue to Treat and prevent inflammation:   A. Symbicort 160 - 1-2 inhalations twice a day  B. Flonase - one spray each nostril twice a day  C. montelukast - one tablet one time per day  D. Nucala injections every 4 weeks  3. Continue to Treat and prevent reflux:   A. pantoprazole 40 mg in a.m.  B. famotidine 20 mg or ranitidine 300 mg in PM  4. Continue to Treat and prevent headaches:   A. cyproheptadine 2-4 mg at bedtime  5. If needed:   A. Patanase 2 sprays each nostril two times per day  B. OTC antihistamine  C. Proventil HFA or similar 2 inhalations every 4-6 hours  D. hydroxyzine  6. Continue Iron supplementation and MVI. Check CBC end of month  7. Continue eye therapy with Dr. Delman Cheadle.  8. For this recent issue:   A. Nasal saline several times per day  B. Augmentin 875 one tablet twice a day  9. Return to clinic in 12 weeks or earlier if problem  10. Obtain fall flu vaccine  11. Evaluation for clotting problem with hematologist

## 2017-06-25 ENCOUNTER — Telehealth: Payer: Self-pay | Admitting: Allergy and Immunology

## 2017-06-25 LAB — HEMOGLOBIN A1C
Hgb A1c MFr Bld: 5.3 % (ref <5.7)
Mean Plasma Glucose: 105 mg/dL

## 2017-06-25 NOTE — Telephone Encounter (Signed)
Pt called and said that she has some stuff in her throat that is black like before and she has not heard from any one about going to have blood work done.[phone]

## 2017-06-26 NOTE — Telephone Encounter (Signed)
Please make her a appointment with Dr. Alvy Bimler to evaluate for clotting disorder.

## 2017-06-27 LAB — AFB CULTURE WITH SMEAR (NOT AT ARMC)

## 2017-06-27 NOTE — Telephone Encounter (Signed)
Patient needs referral can you please fax it over. Thanks

## 2017-06-27 NOTE — Telephone Encounter (Signed)
Referral placed in the Work que for [facility] with Dr. Alvy Bimler to give the patient a call to schedule.   Thanks

## 2017-06-27 NOTE — Telephone Encounter (Signed)
Dr. Heath Lark Internal medicine  [address] [place], [place] [address]  [phone]

## 2017-06-30 ENCOUNTER — Telehealth: Payer: Self-pay | Admitting: Pediatrics

## 2017-06-30 ENCOUNTER — Ambulatory Visit (INDEPENDENT_AMBULATORY_CARE_PROVIDER_SITE_OTHER): Payer: BLUE CROSS/BLUE SHIELD | Admitting: "Endocrinology

## 2017-06-30 ENCOUNTER — Encounter: Payer: Self-pay | Admitting: "Endocrinology

## 2017-06-30 ENCOUNTER — Telehealth: Payer: Self-pay

## 2017-06-30 VITALS — BP 132/85 | HR 92 | Ht 62.0 in | Wt 148.0 lb

## 2017-06-30 DIAGNOSIS — R7303 Prediabetes: Secondary | ICD-10-CM | POA: Diagnosis not present

## 2017-06-30 DIAGNOSIS — E042 Nontoxic multinodular goiter: Secondary | ICD-10-CM

## 2017-06-30 NOTE — Telephone Encounter (Signed)
I called patient. Patient wants to know if she should go back to her ENT? She feels it her throat not her lungs. Patient stated if she needs to go to the ENT to send a referral to Dr. Thornell Mule. Patient would like to have another referral to a different Endocrinology here in [place].

## 2017-06-30 NOTE — Telephone Encounter (Signed)
Pt called back about wanting to know what to do about the black particles in her throat or does she need to go back to the other doctor.[phone].

## 2017-06-30 NOTE — Telephone Encounter (Signed)
Needs to be seen for knot under her chin or can discuss at next visit.

## 2017-06-30 NOTE — Telephone Encounter (Signed)
She had labs drawn 8/7 with normal thyroid values. She does not need to come in now, does need other labs until 12/2017.  Will need to be seen for spot on her chin, can schedule appt or discuss at regular follow up appt.

## 2017-06-30 NOTE — Telephone Encounter (Signed)
Went over Dr Emi Holes instructions with Mrs. Womack. She will need Thyroid u/s scheduled anytime in the next few weeks and lab work done 1 week prior to her next appt in Feb 2019. Pt went to the lab today and the lab told her it doesn't need to be done yet. Pt extremely upset because she states that [facility] and Dr Carmelina Peal want labs done. Pt states she will not be coming back.

## 2017-06-30 NOTE — Telephone Encounter (Signed)
Please inform patient that it may be worthwhile for her to visit with a pulmonologist in regard to this issue. I believe that she has seen a pulmonologist in the past and we can refer her back to that doctor for further evaluation of this issue.

## 2017-06-30 NOTE — Telephone Encounter (Signed)
Will need appt before we draw any labs. Can discuss at her next appt or schedule one sooner if she wants.

## 2017-06-30 NOTE — Progress Notes (Signed)
Subjective:    Patient ID: Jennifer Mullins, female    DOB: 01/22/1971, PCP Eustaquio Maize, MD   Past Medical History:  Diagnosis Date  . Anemia   . Anxiety   . Arthritis   . Asthma   . Bilateral calcaneal spurs   . Bilateral polycystic ovarian syndrome   . Bulging lumbar disc   . Carpal tunnel syndrome, bilateral   . Conjunctivitis   . COPD (chronic obstructive pulmonary disease) (HCC)    BRONCHITIS  . Diabetes mellitus without complication ([place])    TYPE 2   DX  4-5 YRS AGO  . ETD (Eustachian tube dysfunction), bilateral    left worse than right  . Fibroids    uterine  . GAD (generalized anxiety disorder)   . GERD (gastroesophageal reflux disease)    TAKES PRESCRIPTION MEDS  . Glaucoma    BOTH EYES  . Hematoma    Hematoma of Left Hand- had to wear a split  . Hyperlipidemia   . Hypertension   . Hypothyroidism    NODULES ON THYROID  . PONV (postoperative nausea and vomiting)   . Pulmonary embolism ([place])   . Tachycardia   . Trigger finger   . Trigger finger of left thumb    Past Surgical History:  Procedure Laterality Date  . ANTERIOR CERVICAL DECOMP/DISCECTOMY FUSION N/A 11/13/2016   Procedure: Cervical five-six, Cervical six-seven Anterior Cervical Discectomy and Fusion, Allograft, Plate;  Surgeon: Marybelle Killings, MD;  Location: [facility];  Service: Orthopedics;  Laterality: N/A;  . BREAST SURGERY    . CARPAL TUNNEL RELEASE    . DILATION AND CURETTAGE OF UTERUS    . EXPLORATORY LAPAROTOMY    . NASAL SINUS SURGERY  07/22/13   Dr. Redmond Pulling in [place] History  . Marital status: Single    Spouse name: N/A  . Number of children: N/A  . Years of education: N/A   Occupational History  . Unemployed    Social History Main Topics  . Smoking status: Former Smoker    Packs/day: 0.25    Years: 5.00    Types: Cigarettes    Quit date: 11/18/1989  . Smokeless tobacco: Never Used  . Alcohol use No  . Drug use: No  . Sexual activity: Not Asked    Other Topics Concern  . None   Social History Narrative  . None   Outpatient Encounter Prescriptions as of 06/30/2017  Medication Sig  . acetaminophen-codeine (TYLENOL #3) 300-30 MG tablet Take 1 tablet by mouth every 12 (twelve) hours as needed for moderate pain.  Marland Kitchen albuterol (PROVENTIL) (2.5 MG/3ML) 0.083% nebulizer solution Take 2.5 mg by nebulization every 6 (six) hours as needed for wheezing or shortness of breath.  . ALPRAZolam (XANAX) 0.5 MG tablet Take 1 tablet (0.5 mg total) by mouth 3 (three) times daily as needed for anxiety.  Marland Kitchen amoxicillin-clavulanate (AUGMENTIN) 875-125 MG tablet Take 1 tablet by mouth 2 (two) times daily.  Marland Kitchen AUVI-Q 0.3 MG/0.3ML SOAJ injection Use as directed for life-threatening allergic reaction.  . budesonide-formoterol (SYMBICORT) 160-4.5 MCG/ACT inhaler Inhale 2 puffs into the lungs 2 (two) times daily.  . cromolyn (OPTICROM) 4 % ophthalmic solution   . cyproheptadine (PERIACTIN) 4 MG tablet TAKE 1 TABLET BY MOUTH AT BEDTIME  . diclofenac (VOLTAREN) 0.1 % ophthalmic solution   . DULoxetine (CYMBALTA) 30 MG capsule   . Eflornithine HCl 13.9 % cream Apply 1 application topically 2 (two) times  daily with a meal.  . famotidine (PEPCID) 20 MG tablet   . ferrous sulfate 325 (65 FE) MG tablet Take 1 tablet (325 mg total) by mouth every other day.  . fluconazole (DIFLUCAN) 150 MG tablet Take 1 tablet today then repeat in three days.  . fluticasone (FLONASE) 50 MCG/ACT nasal spray Place 1 spray into both nostrils 2 (two) times daily.  . furosemide (LASIX) 20 MG tablet Take 1 tablet (20 mg total) by mouth daily.  . hydrOXYzine (ATARAX/VISTARIL) 25 MG tablet TAKE 1 OR 2 TABLETS BY MOUTH THREE TIMES DAILY  . ipratropium (ATROVENT) 0.03 % nasal spray Place 2 sprays into both nostrils every 12 (twelve) hours.  Marland Kitchen linaclotide (LINZESS) 290 MCG CAPS capsule Take 1 capsule (290 mcg total) by mouth daily before breakfast.  . losartan (COZAAR) 25 MG tablet Take 1 tablet  (25 mg total) by mouth daily.  . metFORMIN (GLUCOPHAGE) 500 MG tablet Take 1 tablet (500 mg total) by mouth daily with breakfast.  . montelukast (SINGULAIR) 10 MG tablet Take 1 tablet (10 mg total) by mouth at bedtime.  . Olopatadine HCl 0.6 % SOLN 2 sprays each nostril two times per day.  . pantoprazole (PROTONIX) 40 MG tablet TAKE 1 TABLET BY MOUTH EVERY DAY  . polyethylene glycol (MIRALAX / GLYCOLAX) packet Take 17 g by mouth daily as needed for mild constipation.  . pravastatin (PRAVACHOL) 20 MG tablet Take 1 tablet (20 mg total) by mouth every other day.  . prednisoLONE acetate (PRED FORTE) 1 % ophthalmic suspension   . PROAIR HFA 108 (90 Base) MCG/ACT inhaler INHALE 1 TO 2 PUFFS EVERY FOUR HOURS AS NEEDED WHEEZING SHORTNESS OF BREATH   Facility-Administered Encounter Medications as of 06/30/2017  Medication  . Mepolizumab SOLR 100 mg   ALLERGIES: Allergies  Allergen Reactions  . Demerol [Meperidine] Shortness Of Breath    FLUSHING AND SHORTNESS OF BREATH  . Morphine And Related Shortness Of Breath    Flushed and hot hyper  . Diltiazem Hcl Hives  . Lisinopril     Caused cough  . Tramadol Other (See Comments)    Tears up stomach   VACCINATION STATUS:  There is no immunization history on file for this patient.  HPI   46 year old female patient with medical history as above. She is Returning to follow-up for her multinodular goiter and prediabetes. -She had thyroid ultrasound on 11/03/2015 which was unremarkable/unchanged since 2015, 7 mm nodule on the right lobe and 3 mm nodule on the left lobe.  -She is not on any thyroid hormone nor any antithyroid medications. Regarding her pre-diabetes, her recent A1c has improved to 5.3%, remains on metformin 500 mg by mouth once a day. She has history of PCOS, and metformin was started due to PCOS. - She has gained 5 pounds since last visit.  - She brought in a Styrofoam cup with some spits of saliva in it and she  insists that there are  dark spots in it. - She also complains of tight feeling in her neck.  Review of Systems Constitutional: + weight gain, no fatigue, no subjective hyperthermia/hypothermia, she uses a walker Eyes: no blurry vision, no xerophthalmia ENT: no sore throat, no nodules palpated in throat, no dysphagia/odynophagia, no hoarseness Cardiovascular: no CP/SOB/palpitations/leg swelling Respiratory: no cough/SOB Gastrointestinal: no N/V/D/C Musculoskeletal: no muscle/joint aches Skin: no rashes Neurological: no tremors/numbness/tingling/dizziness Psychiatric: no depression/anxiety  Objective:    BP 132/85   Pulse 92   Ht 5\' 2"  (1.575 m)  Wt 148 lb (67.1 kg)   BMI 27.07 kg/m   Wt Readings from Last 3 Encounters:  06/30/17 148 lb (67.1 kg)  05/28/17 148 lb 12.8 oz (67.5 kg)  04/25/17 147 lb 6.4 oz (66.9 kg)    Physical Exam Constitutional: Slightly, overweight, in NAD, uses a cane to get around.  Eyes: PERRLA, EOMI, no exophthalmos ENT: moist mucous membranes, no gross thyromegaly, + possible shotty submandibular lymph nodes.  Cardiovascular: RRR, No MRG Respiratory: CTA B Gastrointestinal: abdomen soft, NT, ND, BS+ Musculoskeletal: + Walks with a cane, no deformities, strength intact in all 4 Skin: moist, warm, no rashes Neurological: no tremor with outstretched hands, DTR normal in all 4  CMP     Component Value Date/Time   NA 138 06/24/2017 1546   NA 143 01/20/2017 1056   K 4.2 06/24/2017 1546   CL 103 06/24/2017 1546   CO2 24 06/24/2017 1546   GLUCOSE 73 06/24/2017 1546   BUN 11 06/24/2017 1546   BUN 8 01/20/2017 1056   CREATININE 0.82 06/24/2017 1546   CALCIUM 9.0 06/24/2017 1546   PROT 6.8 06/24/2017 1546   PROT 6.6 10/04/2016 1105   ALBUMIN 4.2 06/24/2017 1546   ALBUMIN 4.1 10/04/2016 1105   AST 14 06/24/2017 1546   ALT 9 06/24/2017 1546   ALKPHOS 89 06/24/2017 1546   BILITOT 0.2 06/24/2017 1546   BILITOT <0.2 10/04/2016 1105   GFRNONAA 109 01/20/2017 1056   GFRAA  125 01/20/2017 1056     Diabetic Labs (most recent): Lab Results  Component Value Date   HGBA1C 5.3 06/24/2017   HGBA1C 5.4 11/20/2016   HGBA1C 5.6 10/26/2015     Lipid Panel ( most recent) Lipid Panel     Component Value Date/Time   CHOL 172 05/28/2017 0945   TRIG 118 05/28/2017 0945   TRIG 142 05/18/2014 1556   HDL 55 05/28/2017 0945   HDL 46 05/18/2014 1556   CHOLHDL 3.1 05/28/2017 0945   LDLCALC 93 05/28/2017 0945   LDLCALC 149 (H) 05/18/2014 1556      Assessment & Plan:   1. Nontoxic multinodular goiter -  She will have repeat surveillance thyroid/neck ultrasound. -  Thyroid function tests are within normal limits which means that thyroid function is normal.  - Next steps will depend on ultrasound findings.  2. Prediabetes: Her A1c has improved from 5.9% to 5.3%  -She will continue to benefit from low-dose metformin therapy 500 mg by mouth once  a day. She does not have to monitor blood glucose for now. She has normal renal function, normal hepatic advance, normal CMP. Her recent fasting lipid panel is also unremarkable HDL 55, LDL 93, total cholesterol 172, triglyceride 118. She would not require intervention with antilipid therapy for now.  - She  has history of PCO S with mild hyperandrogenism in the past, however her testosterone level from December 2015 was normal at 20.  She insists that she has to have repeat testosterone measurement. She will have repeat testosterone measurement prior to her next visit.  -  Patient with multiple nonspecific complaints including weight gain, abnormal discoloration of her spits, diffuse body pain, hirsutism. The spits of saliva she brought in Styrofoam cup are not abnormal looking- no gross discoloration, no gross blood. However I advised her to continue follow-up with ENT.   - I advised patient to maintain close follow up with Eustaquio Maize, MD for primary care needs. Follow up plan: Return in about 6 months (around 12/31/2017)  for follow up with pre-visit labs, Thyroid / Neck Ultrasound.  Glade Lloyd, MD Phone: [phone]  Fax: [phone]   06/30/2017, 11:31 AM

## 2017-06-30 NOTE — Telephone Encounter (Signed)
Returned patient's phone call.  Patient would like to have testosterone level checked through Dr. Evette Doffing.

## 2017-06-30 NOTE — Telephone Encounter (Signed)
Call back on [phone]

## 2017-07-01 ENCOUNTER — Ambulatory Visit (INDEPENDENT_AMBULATORY_CARE_PROVIDER_SITE_OTHER): Payer: BLUE CROSS/BLUE SHIELD | Admitting: *Deleted

## 2017-07-01 DIAGNOSIS — J309 Allergic rhinitis, unspecified: Secondary | ICD-10-CM

## 2017-07-01 NOTE — Telephone Encounter (Signed)
Okay patient seen Dr. Dorris Fetch the Endo doctor on yesterday, not sure why she is wanting another one. Also am I needing to refer the patient to a Pulmonologist or ENT? I also sent her referral over to Dr. Alvy Bimler on 06/27/2017 the Oncologist. I am waiting for them to respond back.

## 2017-07-01 NOTE — Telephone Encounter (Signed)
Patient is aware you are out of the office until Wednesday but wanted me to go ahead and send you this message instead of your coverage for today.  She said Dr. Dorris Fetch did want her to have her testosterone checked until February right before she is scheduled to see him again.  She wants to come here and have that done before then.  I explained to her that she would need to be seen before these labs could be done.  She is upset and said she had been seen in July and discussed this and feels she should not have to be seen again.  I tried to convince her to let me schedule her an appointment but she refused and asked that I send you another message to ask if she could just come in and do the labs without being seen again.

## 2017-07-02 ENCOUNTER — Encounter: Payer: Self-pay | Admitting: Hematology

## 2017-07-02 ENCOUNTER — Other Ambulatory Visit: Payer: BLUE CROSS/BLUE SHIELD | Admitting: Orthotics

## 2017-07-02 ENCOUNTER — Telehealth: Payer: Self-pay | Admitting: Hematology

## 2017-07-02 NOTE — Telephone Encounter (Signed)
Patient aware that she will need to be seen at this office to have lab draw for testosterone.  Per Dr. Evette Doffing.  Appt made

## 2017-07-02 NOTE — Telephone Encounter (Signed)
Pt cld to schedule a hem appt. Appt has been scheduled for the pt to see Dr. Irene Limbo on 8/20 at 1pm. Pt agreed to the appt date and time. Letter mailed.

## 2017-07-03 ENCOUNTER — Other Ambulatory Visit: Payer: Self-pay | Admitting: Pediatrics

## 2017-07-03 DIAGNOSIS — K5900 Constipation, unspecified: Secondary | ICD-10-CM

## 2017-07-03 DIAGNOSIS — H1013 Acute atopic conjunctivitis, bilateral: Secondary | ICD-10-CM | POA: Diagnosis not present

## 2017-07-03 DIAGNOSIS — J4550 Severe persistent asthma, uncomplicated: Secondary | ICD-10-CM | POA: Diagnosis not present

## 2017-07-03 DIAGNOSIS — E785 Hyperlipidemia, unspecified: Secondary | ICD-10-CM

## 2017-07-04 NOTE — Telephone Encounter (Signed)
Patient called and wanted to know about her referral? Patient stated she could not get with a live person on the phone.

## 2017-07-07 ENCOUNTER — Ambulatory Visit (HOSPITAL_BASED_OUTPATIENT_CLINIC_OR_DEPARTMENT_OTHER): Payer: BLUE CROSS/BLUE SHIELD

## 2017-07-07 ENCOUNTER — Ambulatory Visit (HOSPITAL_BASED_OUTPATIENT_CLINIC_OR_DEPARTMENT_OTHER): Payer: BLUE CROSS/BLUE SHIELD | Admitting: Hematology

## 2017-07-07 ENCOUNTER — Encounter: Payer: Self-pay | Admitting: Hematology

## 2017-07-07 ENCOUNTER — Ambulatory Visit (HOSPITAL_COMMUNITY)
Admission: RE | Admit: 2017-07-07 | Discharge: 2017-07-07 | Disposition: A | Discharge status: Home / Self Care | Payer: BLUE CROSS/BLUE SHIELD | Source: Ambulatory Visit | Attending: "Endocrinology | Admitting: "Endocrinology

## 2017-07-07 VITALS — BP 143/78 | HR 90 | Temp 98.7°F | Resp 18 | Ht 62.0 in | Wt 150.1 lb

## 2017-07-07 DIAGNOSIS — D500 Iron deficiency anemia secondary to blood loss (chronic): Secondary | ICD-10-CM | POA: Diagnosis not present

## 2017-07-07 DIAGNOSIS — D6859 Other primary thrombophilia: Secondary | ICD-10-CM | POA: Diagnosis not present

## 2017-07-07 DIAGNOSIS — E041 Nontoxic single thyroid nodule: Secondary | ICD-10-CM | POA: Diagnosis not present

## 2017-07-07 DIAGNOSIS — E042 Nontoxic multinodular goiter: Secondary | ICD-10-CM | POA: Insufficient documentation

## 2017-07-07 LAB — COMPREHENSIVE METABOLIC PANEL
ALT: 15 U/L (ref 0–55)
AST: 17 U/L (ref 5–34)
Albumin: 3.7 g/dL (ref 3.5–5.0)
Alkaline Phosphatase: 86 U/L (ref 40–150)
Anion Gap: 6 mEq/L (ref 3–11)
BUN: 7.4 mg/dL (ref 7.0–26.0)
CO2: 25 mEq/L (ref 22–29)
Calcium: 8.6 mg/dL (ref 8.4–10.4)
Chloride: 108 mEq/L (ref 98–109)
Creatinine: 0.8 mg/dL (ref 0.6–1.1)
EGFR: >90 mL/min/{1.73_m2} (ref >90)
Glucose: 86 mg/dl (ref 70–140)
Potassium: 3.6 mEq/L (ref 3.5–5.1)
Sodium: 138 mEq/L (ref 136–145)
Total Bilirubin: <0.22 mg/dL (ref 0.20–1.20)
Total Protein: 7 g/dL (ref 6.4–8.3)

## 2017-07-07 LAB — CBC & DIFF AND RETIC
BASO%: 0.2 % (ref 0.0–2.0)
Basophils Absolute: 0 10*3/uL (ref 0.0–0.1)
EOS%: 0.4 % (ref 0.0–7.0)
Eosinophils Absolute: 0 10*3/uL (ref 0.0–0.5)
HCT: 30.2 % — ABNORMAL LOW (ref 34.8–46.6)
HGB: 9.1 g/dL — ABNORMAL LOW (ref 11.6–15.9)
Immature Retic Fract: 24.8 % — ABNORMAL HIGH (ref 1.60–10.00)
LYMPH%: 38.3 % (ref 14.0–49.7)
MCH: 22.8 pg — ABNORMAL LOW (ref 25.1–34.0)
MCHC: 30.1 g/dL — ABNORMAL LOW (ref 31.5–36.0)
MCV: 75.5 fL — ABNORMAL LOW (ref 79.5–101.0)
MONO#: 0.4 10*3/uL (ref 0.1–0.9)
MONO%: 8.2 % (ref 0.0–14.0)
NEUT#: 2.5 10*3/uL (ref 1.5–6.5)
NEUT%: 52.9 % (ref 38.4–76.8)
Platelets: 242 10*3/uL (ref 145–400)
RBC: 4 10*6/uL (ref 3.70–5.45)
RDW: 18.8 % — ABNORMAL HIGH (ref 11.2–14.5)
Retic %: 1.47 % (ref 0.70–2.10)
Retic Ct Abs: 58.8 10*3/uL (ref 33.70–90.70)
WBC: 4.8 10*3/uL (ref 3.9–10.3)
lymph#: 1.8 10*3/uL (ref 0.9–3.3)

## 2017-07-07 NOTE — Telephone Encounter (Signed)
Spoke with patient, she stated she CX the Pulmonologist appt due to Dr. Neldon Mc  Saying she didn't need it any more. She went to Dr. Dorris Fetch and didn't like that he didn't do Testoterone level testing or really listen to her. Patient is having to go back to her PCP in order to get the Testerone level testing done. She has a scan set up for today for Soft Tissue Head/ Neck which was ordered by Dr. Dorris Fetch. She also has her first hematology appt today for the blood clot disorder. She would now like to go to ENT named Dr. Vicie Mutters, she has been seen by him before. She would also like a different Endocrinologist. Patient stated all this is related to her Black Dust Particals in her Throat.   Please Advise Dr. Neldon Mc.

## 2017-07-07 NOTE — Patient Instructions (Signed)
Thank you for choosing [facility] to provide your oncology and hematology care.  To afford each patient quality time with our providers, please arrive 30 minutes before your scheduled appointment time.  If you arrive late for your appointment, you may be asked to reschedule.  We strive to give you quality time with our providers, and arriving late affects you and other patients whose appointments are after yours.   If you are a no show for multiple scheduled visits, you may be dismissed from the clinic at the providers discretion.    Again, thank you for choosing [facility], our hope is that these requests will decrease the amount of time that you wait before being seen by our physicians.  ______________________________________________________________________  Should you have questions after your visit to the [facility], please contact our office at [phone] between the hours of 8:30 and 4:30 p.m.    Voicemails left after 4:30p.m will not be returned until the following business day.    For prescription refill requests, please have your pharmacy contact us directly.  Please also try to allow 48 hours for prescription requests.    Please contact the scheduling department for questions regarding scheduling.  For scheduling of procedures such as PET scans, CT scans, MRI, Ultrasound, etc please contact central scheduling at [phone].    Resources For Cancer Patients and Caregivers:   Oncolink.org:  A wonderful resource for patients and healthcare providers for information regarding your disease, ways to tract your treatment, what to expect, etc.     American Cancer Society:  800-227-2345  Can help patients locate various types of support and financial assistance  Cancer Care: 1-800-813-HOPE (4673) Provides financial assistance, online support groups, medication/co-pay assistance.    [place] DSS:  336-641-3447 Where to apply for food  stamps, Medicaid, and utility assistance  Medicare Rights Center: 800-333-4114 Helps people with Medicare understand their rights and benefits, navigate the Medicare system, and secure the quality healthcare they deserve  SCAT: 336-333-6589 [place] [employer]'s shared-ride transportation service for eligible riders who have a disability that prevents them from riding the fixed route bus.    For additional information on assistance programs please contact our social worker:   Grier Hock/Abigail Elmore:  [phone]            

## 2017-07-07 NOTE — Progress Notes (Signed)
[facility]    HEMATOLOGY/ONCOLOGY CONSULTATION NOTE  Date of Service: 07/07/2017  Patient Care Team: Eustaquio Maize, MD as PCP - General (Pediatrics)  CHIEF COMPLAINTS/PURPOSE OF CONSULTATION:   H/o PE  Anemia   HISTORY OF PRESENTING ILLNESS:  Jennifer Mullins is a wonderful 46 y.o. female who has been referred to [place] by Dr .Evette Doffing, [place] Hun, MD  for evaluation and management of Pulmonary embolism and Anemia.  Patient has a h/o HTN, HLD, DM2 , GERD who had C5/6 C6/7 anterior disectomy and allograft fusion with plate on 16/08/9603 and was discharged home with cervical collar then presented to the ED with left sided pleuritic chest pain and SOB. CTA was done which showed left lingular PE without hemodynamic compromise. Lower extremity venous Doppler with no evidence of DVT, left upper extremity with no evidence of deep DVT, only indeterminate age thrombosis in basilic vein.  PE was thought to be provoked by surgery and patient was anticoagulated with ELiquis for 6 months and completed anticoagulation in 05/2017.  She notes no previously h/o VTE. No Fhx of VTE. She has had 1 pregnancy previously and was on estrogen containing birth control pills from age 51 yr to 34 yrs without any issues with VTE.  She has had heavy menstrual losses and resulting iron deficiency for which she has been taking multivitamin with iron. She notes that despite this she is feeling quite fatigied and craving ice. Notes to have significant anemia with hgb of 9.1. She is keen to have her anemia corrected aggressively.  No other overt bleeding.  We discussed that her PE is clearly provoked by surgery and that hypercoag w/u would not typically recommended but patient is insistent that she would want to r/o other potential etiologies.  No focal signs suggestive of cancer.   MEDICAL HISTORY:  Past Medical History:  Diagnosis Date  . Anemia   . Anxiety   . Arthritis   . Asthma   . Bilateral calcaneal spurs   .  Bilateral polycystic ovarian syndrome   . Bulging lumbar disc   . Carpal tunnel syndrome, bilateral   . Conjunctivitis   . COPD (chronic obstructive pulmonary disease) (HCC)    BRONCHITIS  . Diabetes mellitus without complication ([place])    TYPE 2   DX  4-5 YRS AGO  . ETD (Eustachian tube dysfunction), bilateral    left worse than right  . Fibroids    uterine  . GAD (generalized anxiety disorder)   . GERD (gastroesophageal reflux disease)    TAKES PRESCRIPTION MEDS  . Glaucoma    BOTH EYES  . Hematoma    Hematoma of Left Hand- had to wear a split  . Hyperlipidemia   . Hypertension   . Hypothyroidism    NODULES ON THYROID  . PONV (postoperative nausea and vomiting)   . Pulmonary embolism ([place])   . Tachycardia   . Trigger finger   . Trigger finger of left thumb     SURGICAL HISTORY: Past Surgical History:  Procedure Laterality Date  . ANTERIOR CERVICAL DECOMP/DISCECTOMY FUSION N/A 11/13/2016   Procedure: Cervical five-six, Cervical six-seven Anterior Cervical Discectomy and Fusion, Allograft, Plate;  Surgeon: Marybelle Killings, MD;  Location: [facility];  Service: Orthopedics;  Laterality: N/A;  . BREAST SURGERY    . CARPAL TUNNEL RELEASE    . DILATION AND CURETTAGE OF UTERUS    . EXPLORATORY LAPAROTOMY    . NASAL SINUS SURGERY  07/22/13   Dr. Redmond Pulling in [place]  SOCIAL HISTORY: Social History   Social History  . Marital status: Single    Spouse name: N/A  . Number of children: N/A  . Years of education: N/A   Occupational History  . Unemployed    Social History Main Topics  . Smoking status: Former Smoker    Packs/day: 0.25    Years: 5.00    Types: Cigarettes    Quit date: 11/18/1989  . Smokeless tobacco: Never Used  . Alcohol use No  . Drug use: No  . Sexual activity: Not on file   Other Topics Concern  . Not on file   Social History Narrative  . No narrative on file    FAMILY HISTORY: Family History  Problem Relation Age of Onset  . Hypertension Mother   .  Hypertension Father     ALLERGIES:  is allergic to demerol [meperidine]; morphine and related; diltiazem hcl; lisinopril; and tramadol.  MEDICATIONS:  Current Outpatient Prescriptions  Medication Sig Dispense Refill  . acetaminophen-codeine (TYLENOL #3) 300-30 MG tablet Take 1 tablet by mouth every 12 (twelve) hours as needed for moderate pain. 60 tablet 2  . albuterol (PROVENTIL) (2.5 MG/3ML) 0.083% nebulizer solution Take 2.5 mg by nebulization every 6 (six) hours as needed for wheezing or shortness of breath.    . ALPRAZolam (XANAX) 0.5 MG tablet Take 1 tablet (0.5 mg total) by mouth 3 (three) times daily as needed for anxiety. 90 tablet 2  . amoxicillin-clavulanate (AUGMENTIN) 875-125 MG tablet Take 1 tablet by mouth 2 (two) times daily. 20 tablet 0  . AUVI-Q 0.3 MG/0.3ML SOAJ injection Use as directed for life-threatening allergic reaction. 2 Device 3  . budesonide-formoterol (SYMBICORT) 160-4.5 MCG/ACT inhaler Inhale 2 puffs into the lungs 2 (two) times daily. 1 Inhaler 5  . cromolyn (OPTICROM) 4 % ophthalmic solution   1  . cyproheptadine (PERIACTIN) 4 MG tablet TAKE 1 TABLET BY MOUTH AT BEDTIME 30 tablet 1  . diclofenac (VOLTAREN) 0.1 % ophthalmic solution   0  . DULoxetine (CYMBALTA) 30 MG capsule   2  . Eflornithine HCl 13.9 % cream Apply 1 application topically 2 (two) times daily with a meal. 45 g 1  . famotidine (PEPCID) 20 MG tablet   5  . ferrous sulfate 325 (65 FE) MG tablet Take 1 tablet (325 mg total) by mouth every other day. 30 tablet 3  . fluticasone (FLONASE) 50 MCG/ACT nasal spray Place 1 spray into both nostrils 2 (two) times daily. 16 g 5  . furosemide (LASIX) 20 MG tablet Take 1 tablet (20 mg total) by mouth daily. 30 tablet 5  . hydrOXYzine (ATARAX/VISTARIL) 25 MG tablet TAKE 1 OR 2 TABLETS BY MOUTH THREE TIMES DAILY 90 tablet 0  . ipratropium (ATROVENT) 0.03 % nasal spray Place 2 sprays into both nostrils every 12 (twelve) hours. 30 mL 5  . LINZESS 290 MCG CAPS  capsule TAKE ONE CAPSULE BY MOUTH BEFORE BREAKFAST 90 capsule 0  . losartan (COZAAR) 25 MG tablet Take 1 tablet (25 mg total) by mouth daily. 30 tablet 3  . metFORMIN (GLUCOPHAGE) 500 MG tablet Take 1 tablet (500 mg total) by mouth daily with breakfast. 30 tablet 2  . montelukast (SINGULAIR) 10 MG tablet Take 1 tablet (10 mg total) by mouth at bedtime. 30 tablet 5  . Olopatadine HCl 0.6 % SOLN 2 sprays each nostril two times per day. 1 Bottle 5  . pantoprazole (PROTONIX) 40 MG tablet TAKE 1 TABLET BY MOUTH EVERY DAY 30 tablet  5  . polyethylene glycol (MIRALAX / GLYCOLAX) packet Take 17 g by mouth daily as needed for mild constipation. 14 each 0  . pravastatin (PRAVACHOL) 20 MG tablet Take 1 tablet (20 mg total) by mouth every other day. 30 tablet 0  . PROAIR HFA 108 (90 Base) MCG/ACT inhaler INHALE 1 TO 2 PUFFS EVERY FOUR HOURS AS NEEDED WHEEZING SHORTNESS OF BREATH 9 g 2   Current Facility-Administered Medications  Medication Dose Route Frequency Provider Last Rate Last Dose  . Mepolizumab SOLR 100 mg  100 mg Subcutaneous Q28 days Jiles Prows, MD   100 mg at 06/10/17 9233    REVIEW OF SYSTEMS:    10 Point review of Systems was done is negative except as noted above.  PHYSICAL EXAMINATION: ECOG PERFORMANCE STATUS: 0 - Asymptomatic  . Vitals:   07/07/17 1300  BP: (!) 143/78  Pulse: 90  Resp: 18  Temp: 98.7 F (37.1 C)  SpO2: 100%   Filed Weights   07/07/17 1300  Weight: 150 lb 1.6 oz (68.1 kg)   .Body mass index is 27.45 kg/m.  GENERAL:alert, in no acute distress and comfortable SKIN: no acute rashes, no significant lesions EYES: conjunctiva are pink and non-injected, sclera anicteric OROPHARYNX: [name], no exudates, no oropharyngeal erythema or ulceration NECK: supple, no JVD LYMPH:  no palpable lymphadenopathy in the cervical, axillary or inguinal regions LUNGS: clear to auscultation b/l with normal respiratory effort HEART: regular rate & rhythm ABDOMEN:  normoactive  bowel sounds , non tender, not distended. No hepatosplenomegaly. Extremity: no pedal edema PSYCH: alert & oriented x 3 with fluent speech NEURO: no focal motor/sensory deficits  LABORATORY DATA:  I have reviewed the data as listed  . CBC Latest Ref Rng & Units 07/08/2017 07/07/2017 06/10/2017  WBC 4.0 - 10.5 K/uL 6.0 4.8 4.5  Hemoglobin 12.0 - 15.0 g/dL 9.1(L) 9.1(L) 9.5(L)  Hematocrit 36.0 - 46.0 % 29.5(L) 30.2(L) 30.6(L)  Platelets 150 - 400 K/uL 255 242 306    . CMP Latest Ref Rng & Units 07/08/2017 07/07/2017 06/24/2017  Glucose 65 - 99 mg/dL 99 86 73  BUN 6 - 20 mg/dL 8 7.4 11  Creatinine 0.44 - 1.00 mg/dL 0.60 0.8 0.82  Sodium 135 - 145 mmol/L 137 138 138  Potassium 3.5 - 5.1 mmol/L 3.9 3.6 4.2  Chloride 101 - 111 mmol/L 108 - 103  CO2 22 - 32 mmol/L 21(L) 25 24  Calcium 8.9 - 10.3 mg/dL 8.5(L) 8.6 9.0  Total Protein 6.5 - 8.1 g/dL 7.0 7.0 6.8  Total Bilirubin 0.3 - 1.2 mg/dL 0.2(L) <0.22 0.2  Alkaline Phos 38 - 126 U/L 74 86 89  AST 15 - 41 U/L 18 17 14   ALT 14 - 54 U/L 14 15 9    Component     Latest Ref Rng & Units 07/07/2017  Hemoglobin F Quantitation     0.0 - 2.0 % 0.0  Hgb A     96.4 - 98.8 % 98.1  HGB S     0.0 % 0.0  HGB C     0.0 % 0.0  Hemoglobin A2 Quantitation     1.8 - 3.2 % 1.9  HGB VARIANT     0.0 % 0.0  HGB INTERPRETATION      Comment  Iron     41 - 142 ug/dL 16 (L)  TIBC     236 - 444 ug/dL 418  UIBC     120 - 384 ug/dL 402 (H)  %SAT  21 - 57 % 4 (L)  Anticardiolipin Ab,IgG,Qn     0 - 14 GPL U/mL <9  Anticardiolipin Ab,IgM,Qn     0 - 12 MPL U/mL <9  Anticardiolipin Ab,IgA,Qn     0 - 11 APL U/mL <9  Beta-2 Glycoprotein I Ab, IgG     0 - 20 GPI IgG units <9  Beta-2 Glyco 1 IgA     0 - 25 GPI IgA units <9  Beta-2 Glyco 1 IgM     0 - 32 GPI IgM units <9  PTT-LA     0.0 - 51.9 sec 32.3  DRVVT     0.0 - 47.0 sec 37.7  Lupus Anticoag Interp      Comment:  Ferritin     9 - 269 ng/ml 9  Vitamin B12     232 - 1,245 pg/mL 701     RADIOGRAPHIC STUDIES: I have personally reviewed the radiological images as listed and agreed with the findings in the report. US Soft Tissue Head And Neck  Result Date: 07/07/2017 CLINICAL DATA:  Thyroid nodule  palpable neck nodule EXAM: THYROID ULTRASOUND TECHNIQUE: Ultrasound examination of the thyroid gland and adjacent soft tissues was performed. COMPARISON:  11/03/2015 FINDINGS: Parenchymal Echotexture: Mildly heterogenous Isthmus: 2 mm, unchanged Right lobe: 4.5 x 1.9 x 1.1 cm, previously 4.5 x 1.7 x 1.4 cm Left lobe: 3.9 x 1.4 x 1.1 cm, previously 3.7 x 1.5 x 1.5 70 _________________________________________________________ Estimated total number of nodules >/= 1 cm: 0 Number of spongiform nodules >/=  2 cm not described below (TR1): 0 Number of mixed cystic and solid nodules >/= 1.5 cm not described below (TR2): 0 _________________________________________________________ Incidental tiny subcentimeter scattered thyroid cystic nodules all measure 5 mm or less in size. No other significant thyroid abnormality. _________________________________________________________ Palpable left cervical abnormality correlates with a mildly prominent elongated left cervical lymph node with preserved architecture measuring 7 mm in short axis. No other significant abnormality. IMPRESSION: Tiny subcentimeter scattered thyroid cysts. No significant thyroid abnormality that warrants biopsy or follow-up Nonspecific prominent left cervical lymph nodes correlate with the palpable abnormality on exam. The above is in keeping with the ACR TI-RADS recommendations - J Am Coll Radiol 2017;14:587-595. Electronically Signed   By: Jerilynn Mages.  Shick M.D.   On: 07/07/2017 10:13    ASSESSMENT & PLAN:   46 yo with   1) Acute Pulmonary in 11/2016 - clearly provoked by cervical spinal surgery on 11/13/2017. No FHx of VTE and no previous personal hx of VTE. Patient has completed 6 months of anticoagulation. Plan -no indication for  additional anticoagulation at this time. -reasonable to continue taking ASA 81mg  po daily for additional 6 months. -hypercoag workup not clearly recommended but done at request of patient - FVL neg , Prothrombin gene profile -ordered -not sent. Antiphospholipid ab - unrevealing.  2) Microcytic Anemia due to Iron def 3) Iron deficiency due to heavy menstrual losses 4) Constipation - does not tolerate PO iron due to GI issues. Plan -discussed pros and cons. -patient provided informed consent for IV iron -IV Injectafer 750mg  IV weekly x 2 doses -continue multivitamin  Labs today IV INjectafer weekly x 2 doses starting ASAP RTC with Dr Irene Limbo with labs in 2 months  All of the patients questions were answered with apparent satisfaction. The patient knows to call the clinic with any problems, questions or concerns.  I spent 40 minutes counseling the patient face to face. The total time spent in the appointment was 55  minutes and more than 50% was on counseling and direct patient cares.    Sullivan Lone MD [place] AAHIVMS [facility] Hematology/Oncology Physician [facility]  (Office):       [phone] (Work cell):  [phone] (Fax):           [phone]  07/07/2017 1:09 PM

## 2017-07-07 NOTE — Telephone Encounter (Signed)
Printed avs and calender for upcoming appoiintments also added labs for today.  per los

## 2017-07-08 ENCOUNTER — Emergency Department (HOSPITAL_COMMUNITY): Payer: BLUE CROSS/BLUE SHIELD

## 2017-07-08 ENCOUNTER — Emergency Department (HOSPITAL_COMMUNITY)
Admission: EM | Admit: 2017-07-08 | Discharge: 2017-07-08 | Disposition: A | Discharge status: Home / Self Care | Payer: BLUE CROSS/BLUE SHIELD | Attending: Emergency Medicine | Admitting: Emergency Medicine

## 2017-07-08 ENCOUNTER — Ambulatory Visit (INDEPENDENT_AMBULATORY_CARE_PROVIDER_SITE_OTHER): Payer: BLUE CROSS/BLUE SHIELD | Admitting: *Deleted

## 2017-07-08 ENCOUNTER — Encounter (HOSPITAL_COMMUNITY): Payer: Self-pay | Admitting: *Deleted

## 2017-07-08 DIAGNOSIS — M549 Dorsalgia, unspecified: Secondary | ICD-10-CM | POA: Diagnosis not present

## 2017-07-08 DIAGNOSIS — M546 Pain in thoracic spine: Secondary | ICD-10-CM | POA: Diagnosis not present

## 2017-07-08 DIAGNOSIS — E119 Type 2 diabetes mellitus without complications: Secondary | ICD-10-CM | POA: Insufficient documentation

## 2017-07-08 DIAGNOSIS — Z79899 Other long term (current) drug therapy: Secondary | ICD-10-CM | POA: Diagnosis not present

## 2017-07-08 DIAGNOSIS — J4550 Severe persistent asthma, uncomplicated: Secondary | ICD-10-CM

## 2017-07-08 DIAGNOSIS — E039 Hypothyroidism, unspecified: Secondary | ICD-10-CM | POA: Diagnosis not present

## 2017-07-08 DIAGNOSIS — I10 Essential (primary) hypertension: Secondary | ICD-10-CM | POA: Diagnosis not present

## 2017-07-08 DIAGNOSIS — J449 Chronic obstructive pulmonary disease, unspecified: Secondary | ICD-10-CM | POA: Insufficient documentation

## 2017-07-08 DIAGNOSIS — Z7984 Long term (current) use of oral hypoglycemic drugs: Secondary | ICD-10-CM | POA: Insufficient documentation

## 2017-07-08 DIAGNOSIS — H1013 Acute atopic conjunctivitis, bilateral: Secondary | ICD-10-CM | POA: Diagnosis not present

## 2017-07-08 DIAGNOSIS — J45909 Unspecified asthma, uncomplicated: Secondary | ICD-10-CM | POA: Insufficient documentation

## 2017-07-08 DIAGNOSIS — R9431 Abnormal electrocardiogram [ECG] [EKG]: Secondary | ICD-10-CM | POA: Diagnosis not present

## 2017-07-08 DIAGNOSIS — R0789 Other chest pain: Secondary | ICD-10-CM | POA: Diagnosis not present

## 2017-07-08 DIAGNOSIS — Z87891 Personal history of nicotine dependence: Secondary | ICD-10-CM | POA: Insufficient documentation

## 2017-07-08 DIAGNOSIS — R0682 Tachypnea, not elsewhere classified: Secondary | ICD-10-CM | POA: Diagnosis not present

## 2017-07-08 DIAGNOSIS — R079 Chest pain, unspecified: Secondary | ICD-10-CM | POA: Diagnosis not present

## 2017-07-08 DIAGNOSIS — R0600 Dyspnea, unspecified: Secondary | ICD-10-CM | POA: Diagnosis not present

## 2017-07-08 DIAGNOSIS — R0602 Shortness of breath: Secondary | ICD-10-CM | POA: Diagnosis not present

## 2017-07-08 LAB — CBC WITH DIFFERENTIAL/PLATELET
Basophils Absolute: 0 10*3/uL (ref 0.0–0.1)
Basophils Relative: 0 %
Eosinophils Absolute: 0 10*3/uL (ref 0.0–0.7)
Eosinophils Relative: 1 %
HCT: 29.5 % — ABNORMAL LOW (ref 36.0–46.0)
Hemoglobin: 9.1 g/dL — ABNORMAL LOW (ref 12.0–15.0)
Lymphocytes Relative: 39 %
Lymphs Abs: 2.3 10*3/uL (ref 0.7–4.0)
MCH: 23.2 pg — ABNORMAL LOW (ref 26.0–34.0)
MCHC: 30.8 g/dL (ref 30.0–36.0)
MCV: 75.3 fL — ABNORMAL LOW (ref 78.0–100.0)
Monocytes Absolute: 0.4 10*3/uL (ref 0.1–1.0)
Monocytes Relative: 7 %
Neutro Abs: 3.2 10*3/uL (ref 1.7–7.7)
Neutrophils Relative %: 53 %
Platelets: 255 10*3/uL (ref 150–400)
RBC: 3.92 MIL/uL (ref 3.87–5.11)
RDW: 18.8 % — ABNORMAL HIGH (ref 11.5–15.5)
WBC: 6 10*3/uL (ref 4.0–10.5)

## 2017-07-08 LAB — COMPREHENSIVE METABOLIC PANEL
ALT: 14 U/L (ref 14–54)
AST: 18 U/L (ref 15–41)
Albumin: 3.8 g/dL (ref 3.5–5.0)
Alkaline Phosphatase: 74 U/L (ref 38–126)
Anion gap: 8 (ref 5–15)
BUN: 8 mg/dL (ref 6–20)
CO2: 21 mmol/L — ABNORMAL LOW (ref 22–32)
Calcium: 8.5 mg/dL — ABNORMAL LOW (ref 8.9–10.3)
Chloride: 108 mmol/L (ref 101–111)
Creatinine, Ser: 0.6 mg/dL (ref 0.44–1.00)
GFR calc Af Amer: >60 mL/min (ref >60)
GFR calc non Af Amer: >60 mL/min (ref >60)
Glucose, Bld: 99 mg/dL (ref 65–99)
Potassium: 3.9 mmol/L (ref 3.5–5.1)
Sodium: 137 mmol/L (ref 135–145)
Total Bilirubin: 0.2 mg/dL — ABNORMAL LOW (ref 0.3–1.2)
Total Protein: 7 g/dL (ref 6.5–8.1)

## 2017-07-08 LAB — I-STAT TROPONIN, ED: Troponin i, poc: 0 ng/mL (ref 0.00–0.08)

## 2017-07-08 LAB — IRON AND TIBC
%SAT: 4 % — ABNORMAL LOW (ref 21–57)
Iron: 16 ug/dL — ABNORMAL LOW (ref 41–142)
TIBC: 418 ug/dL (ref 236–444)
UIBC: 402 ug/dL — ABNORMAL HIGH (ref 120–384)

## 2017-07-08 LAB — I-STAT BETA HCG BLOOD, ED (MC, WL, AP ONLY): I-stat hCG, quantitative: <5 m[IU]/mL (ref <5)

## 2017-07-08 LAB — LUPUS ANTICOAGULANT PANEL
PTT-LA: 32.3 s (ref 0.0–51.9)
dRVVT: 37.7 s (ref 0.0–47.0)

## 2017-07-08 LAB — FERRITIN: Ferritin: 9 ng/ml (ref 9–269)

## 2017-07-08 LAB — VITAMIN B12: Vitamin B12: 701 pg/mL (ref 232–1245)

## 2017-07-08 MED ORDER — HYDROMORPHONE HCL 1 MG/ML IJ SOLN
0.5000 mg | Freq: Once | INTRAMUSCULAR | Status: AC
Start: 2017-07-08 — End: 2017-07-08
  Administered 2017-07-08: 0.5 mg via INTRAVENOUS
  Filled 2017-07-08: qty 1

## 2017-07-08 MED ORDER — IOPAMIDOL (ISOVUE-370) INJECTION 76%
100.0000 mL | Freq: Once | INTRAVENOUS | Status: AC | PRN
Start: 2017-07-08 — End: 2017-07-08
  Administered 2017-07-08: 100 mL via INTRAVENOUS

## 2017-07-08 NOTE — Discharge Instructions (Signed)
Your symptoms are most consistent with muscle strain in your upper back.  Your CT chest does not show blood clot. The remainder of your work-up is reassuring.  Please take OTC pain medications and heat packs for pain. Return for worsening symptoms, including passing out, difficulty breathing, fever, escalating pain or any other symptoms concerning to you.

## 2017-07-08 NOTE — ED Notes (Signed)
Patient in CT at this time.

## 2017-07-08 NOTE — ED Triage Notes (Signed)
Pt arrived by EMS from home. Pt reports sharp left sided pain w/ SOB. Pain increases w/ movement. Pt has hx of PE.

## 2017-07-08 NOTE — ED Notes (Signed)
Patient to radiology at this time.

## 2017-07-08 NOTE — ED Provider Notes (Signed)
[facility] DEPT Provider Note   CSN: [id number] Arrival date & time: 07/08/17  2105     History   Chief Complaint Chief Complaint  Patient presents with  . Shortness of Breath    HPI Jennifer Mullins is a 46 y.o. female.  The history is provided by the patient.  Shortness of Breath  This is a new problem. The problem occurs rarely.The current episode started 1 to 2 hours ago. The problem has not changed since onset.Pertinent negatives include no fever, no cough, no sputum production, no syncope, no vomiting, no abdominal pain, no leg pain and no leg swelling. It is unknown what precipitated the problem. She has tried nothing for the symptoms. Associated medical issues include PE.   46 year old female who presents with upper back pain and shortness of breath. History of PE, not on anticoagulation. History of HTN, HLD, DM, and asthma. Bent down a few days ago, and upon standing up felt a sharp pain the upper left back. Pain went away. Today had sudden onset of sharp left sided upper back pain with shortness of breath while at rest 1 hour prior to arrival. No syncope or near syncope. No calf tenderness or leg swelling. No recent immobilization. Feels similar to prior PE. Also worse with movement of torso. No n/v/d, fever or chills, cough, fall or trauma.   Past Medical History:  Diagnosis Date  . Anemia   . Anxiety   . Arthritis   . Asthma   . Bilateral calcaneal spurs   . Bilateral polycystic ovarian syndrome   . Bulging lumbar disc   . Carpal tunnel syndrome, bilateral   . Conjunctivitis   . COPD (chronic obstructive pulmonary disease) (HCC)    BRONCHITIS  . Diabetes mellitus without complication ([place])    TYPE 2   DX  4-5 YRS AGO  . ETD (Eustachian tube dysfunction), bilateral    left worse than right  . Fibroids    uterine  . GAD (generalized anxiety disorder)   . GERD (gastroesophageal reflux disease)    TAKES PRESCRIPTION MEDS  . Glaucoma    BOTH EYES  . Hematoma      Hematoma of Left Hand- had to wear a split  . Hyperlipidemia   . Hypertension   . Hypothyroidism    NODULES ON THYROID  . PONV (postoperative nausea and vomiting)   . Pulmonary embolism ([place])   . Tachycardia   . Trigger finger   . Trigger finger of left thumb     Patient Active Problem List   Diagnosis Date Noted  . Iron deficiency anemia due to chronic blood loss 07/07/2017  . S/P cervical spinal fusion 02/27/2017  . Pulmonary embolism ([place]) 11/24/2016  . Dysfunctional uterine bleeding 11/24/2016  . Anticoagulated 11/24/2016  . Microcytic anemia 11/24/2016  . Tachycardia   . Vaginal bleeding   . Allergic rhinoconjunctivitis 07/28/2015  . Chronic headache 07/28/2015  . PCOS (polycystic ovarian syndrome) 06/20/2015  . 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 Hyperlipidemia LDL goal <100 06/20/2015  . GAD (generalized anxiety disorder) 05/18/2014  . GERD (gastroesophageal reflux disease) 05/18/2014  . Body fluid retention 07/07/2013  . Chronic sinusitis 07/02/2013  . Extrinsic asthma 05/20/2013  . HTN (hypertension) 05/20/2013  . Prediabetes 05/20/2013    Past Surgical History:  Procedure Laterality Date  . ANTERIOR CERVICAL DECOMP/DISCECTOMY FUSION N/A 11/13/2016   Procedure: Cervical five-six, Cervical six-seven Anterior Cervical Discectomy and Fusion, Allograft, Plate;  Surgeon: Marybelle Killings, MD;  Location:  [facility];  Service: Orthopedics;  Laterality: N/A;  . BREAST SURGERY    . CARPAL TUNNEL RELEASE    . DILATION AND CURETTAGE OF UTERUS    . EXPLORATORY LAPAROTOMY    . NASAL SINUS SURGERY  07/22/13   Dr. Redmond Pulling in [place] History    No data available       Home Medications    Prior to Admission medications   Medication Sig Start Date End Date Taking? Authorizing Provider  acetaminophen-codeine (TYLENOL #3) 300-30 MG tablet Take 1 tablet by mouth every 12 (twelve) hours as needed for moderate pain. 05/28/17  Yes Eustaquio Maize, MD  albuterol  (PROVENTIL) (2.5 MG/3ML) 0.083% nebulizer solution Take 2.5 mg by nebulization every 6 (six) hours as needed for wheezing or shortness of breath.   Yes [provider]  ALPRAZolam (XANAX) 0.5 MG tablet Take 1 tablet (0.5 mg total) by mouth 3 (three) times daily as needed for anxiety. 05/28/17  Yes Eustaquio Maize, MD  amoxicillin-clavulanate (AUGMENTIN) 875-125 MG tablet Take 1 tablet by mouth 2 (two) times daily. 06/24/17  Yes Kozlow, Donnamarie Poag, MD  AUVI-Q 0.3 MG/0.3ML SOAJ injection Use as directed for life-threatening allergic reaction. 06/10/17  Yes Kozlow, Donnamarie Poag, MD  budesonide-formoterol [facility]) 160-4.5 MCG/ACT inhaler Inhale 2 puffs into the lungs 2 (two) times daily. Patient taking differently: Inhale 1-2 puffs into the lungs 2 (two) times daily.  04/16/17  Yes Kozlow, Donnamarie Poag, MD  cromolyn (OPTICROM) 4 % ophthalmic solution Place 1 drop into both eyes 4 (four) times daily.  03/20/17  Yes [provider]  cyproheptadine (PERIACTIN) 4 MG tablet TAKE 1 TABLET BY MOUTH AT BEDTIME Patient taking differently: TAKE 1/2 TABLET BY MOUTH AT BEDTIME 05/08/17  Yes Eustaquio Maize, MD  diclofenac (VOLTAREN) 0.1 % ophthalmic solution Place 1 drop into both eyes 3 (three) times daily.  04/11/17  Yes [provider]  DULoxetine (CYMBALTA) 30 MG capsule Take 20-30 mg by mouth every morning.  06/16/17  Yes [provider]  famotidine (PEPCID) 20 MG tablet Take 20 mg by mouth daily.  06/13/17  Yes [provider]  ferrous sulfate 325 (65 FE) MG tablet Take 1 tablet (325 mg total) by mouth every other day. 04/25/17  Yes Eustaquio Maize, MD  fluticasone (FLONASE) 50 MCG/ACT nasal spray Place 1 spray into both nostrils 2 (two) times daily. 04/16/17  Yes Kozlow, Donnamarie Poag, MD  furosemide (LASIX) 20 MG tablet Take 1 tablet (20 mg total) by mouth daily. Patient taking differently: Take 20 mg by mouth daily as needed for fluid.  03/12/17  Yes Eustaquio Maize, MD  hydrOXYzine  (ATARAX/VISTARIL) 25 MG tablet TAKE 1 OR 2 TABLETS BY MOUTH THREE TIMES DAILY 05/20/17  Yes Eustaquio Maize, MD  ipratropium (ATROVENT) 0.03 % nasal spray Place 2 sprays into both nostrils every 12 (twelve) hours. Patient taking differently: Place 2 sprays into both nostrils every 12 (twelve) hours as needed for rhinitis.  01/03/17  Yes Claretta Fraise, MD  LINZESS 290 MCG CAPS capsule TAKE ONE CAPSULE BY MOUTH BEFORE BREAKFAST 07/03/17  Yes Eustaquio Maize, MD  losartan (COZAAR) 25 MG tablet Take 1 tablet (25 mg total) by mouth daily. Patient taking differently: Take 12.5-25 mg by mouth daily.  03/21/17  Yes Eustaquio Maize, MD  Mepolizumab (NUCALA [place]) Inject into the skin every 28 (twenty-eight) days.   Yes [provider]  metFORMIN (GLUCOPHAGE) 500 MG tablet Take 1  tablet (500 mg total) by mouth daily with breakfast. 05/28/17  Yes Eustaquio Maize, MD  montelukast (SINGULAIR) 10 MG tablet Take 1 tablet (10 mg total) by mouth at bedtime. 04/16/17  Yes Kozlow, Donnamarie Poag, MD  Olopatadine HCl 0.6 % SOLN 2 sprays each nostril two times per day. 04/16/17  Yes Kozlow, Donnamarie Poag, MD  pantoprazole (PROTONIX) 40 MG tablet TAKE 1 TABLET BY MOUTH EVERY DAY 03/26/17  Yes Eustaquio Maize, MD  pravastatin (PRAVACHOL) 20 MG tablet Take 1 tablet (20 mg total) by mouth every other day. 04/25/17  Yes Eustaquio Maize, MD  PROAIR HFA 108 [phone] Base) MCG/ACT inhaler INHALE 1 TO 2 PUFFS EVERY FOUR HOURS AS NEEDED WHEEZING SHORTNESS OF BREATH 06/20/17  Yes Eustaquio Maize, MD  Eflornithine HCl 13.9 % cream Apply 1 application topically 2 (two) times daily with a meal. Patient not taking: Reported on 07/08/2017 05/28/17   Eustaquio Maize, MD    Family History Family History  Problem Relation Age of Onset  . Hypertension Mother   . Hypertension Father     Social History Social History  Substance Use Topics  . Smoking status: Former Smoker    Packs/day: 0.25    Years: 5.00    Types: Cigarettes    Quit date: 11/18/1989   . Smokeless tobacco: Never Used  . Alcohol use No     Allergies   Demerol [meperidine]; Morphine and related; Diltiazem hcl; Lisinopril; and Tramadol   Review of Systems Review of Systems  Constitutional: Negative for fever.  Respiratory: Positive for shortness of breath. Negative for cough and sputum production.   Cardiovascular: Negative for leg swelling and syncope.  Gastrointestinal: Negative for abdominal pain and vomiting.  All other systems reviewed and are negative.    Physical Exam Updated Vital Signs BP 135/85   Pulse 90   Temp 98.5 F (36.9 C) (Oral)   Resp 16   Ht 5\' 2"  (1.575 m)   Wt 68 kg (150 lb)   LMP 06/19/2017   SpO2 98%   BMI 27.44 kg/m   Physical Exam Physical Exam  Nursing note and vitals reviewed. Constitutional: Well developed, well nourished, non-toxic, and appears uncomfortable secondary to pain Head: Normocephalic and atraumatic.  Mouth/Throat: Oropharynx is clear and moist.  Neck: Normal range of motion. Neck supple.  Cardiovascular: Tachycardic rate and regular rhythm.   Pulmonary/Chest: Effort normal and breath sounds normal. mild tenderness to palpation over left upper back around the scapula.  Abdominal: Soft. There is no tenderness. There is no rebound and no guarding.  Musculoskeletal: Normal range of motion. no calf tenderness or edema Neurological: Alert, no facial droop, fluent speech, moves all extremities symmetrically Skin: Skin is warm and dry.  Psychiatric: Cooperative   ED Treatments / Results  Labs (all labs ordered are listed, but only abnormal results are displayed) Labs Reviewed  CBC WITH DIFFERENTIAL/PLATELET - Abnormal; Notable for the following:       Result Value   Hemoglobin 9.1 (*)    HCT 29.5 (*)    MCV 75.3 (*)    MCH 23.2 (*)    RDW 18.8 (*)    All other components within normal limits  COMPREHENSIVE METABOLIC PANEL - Abnormal; Notable for the following:    CO2 21 (*)    Calcium 8.5 (*)    Total  Bilirubin 0.2 (*)    All other components within normal limits  I-STAT BETA HCG BLOOD, ED ([facility], [facility], [facility] ONLY)  I-STAT  TROPONIN, ED    EKG  EKG Interpretation  Date/Time:  Tuesday July 08 2017 21:49:54 EDT Ventricular Rate:  97 PR Interval:    QRS Duration: 87 QT Interval:  354 QTC Calculation: 450 R Axis:   62 Text Interpretation:  Sinus rhythm unremarkable ECG Confirmed by Carmin Muskrat [phone]) on 07/08/2017 9:51:53 PM       Radiology Dg Chest 2 View  Result Date: 07/08/2017 CLINICAL DATA:  Shortness of breath and chest pain EXAM: CHEST  2 VIEW COMPARISON:  Chest radiograph Apr 16, 2017 and chest CT May 02, 2017 FINDINGS: There is no appreciable edema or consolidation. The heart size and pulmonary vascularity are normal. No adenopathy. There is postoperative change in the lower cervical region. No evident pneumothorax. IMPRESSION: No edema or consolidation. The suspected rounded atelectasis in the lateral left base seen on recent CT is not appreciated on this current radiographic examination. Electronically Signed   By: Lowella Grip III M.D.   On: 07/08/2017 21:57   Ct Angio Chest Pe W And/or Wo Contrast  Result Date: 07/08/2017 CLINICAL DATA:  Left-sided chest pain and dyspnea. Pain with movement. EXAM: CT ANGIOGRAPHY CHEST WITH CONTRAST TECHNIQUE: Multidetector CT imaging of the chest was performed using the standard protocol during bolus administration of intravenous contrast. Multiplanar CT image reconstructions and MIPs were obtained to evaluate the vascular anatomy. CONTRAST:  100 cc Isovue 370 IV COMPARISON:  Same day CXR and CT from 05/02/2017. FINDINGS: Cardiovascular: The study is of quality for the evaluation of pulmonary embolism. There are no filling defects in the central, lobar, segmental or subsegmental pulmonary artery branches to suggest acute pulmonary embolism. Great vessels are normal in course and caliber. Normal heart size. No significant pericardial  fluid/thickening. No aortic aneurysm or dissection. Mediastinum/Nodes: No discrete thyroid nodules. Unremarkable esophagus. No pathologically enlarged axillary, mediastinal or hilar lymph nodes. Lungs/Pleura: No pneumothorax. No pleural effusion. A 9 x 8 by 10 mm rounded subpleural density in the lingula is again noted consistent with rounded atelectasis. No significant progression in size is noted. Adjacent subsegmental atelectasis and/or scarring is also present. Upper abdomen: Unremarkable. Left adrenal adenoma is excluded on current exam. Musculoskeletal: No aggressive appearing focal osseous lesions. Patient is status post ACDF of the lower cervical spine. IMPRESSION: 1. No acute pulmonary embolus. 2. Lingular atelectasis and/or scarring, stable in appearance. Electronically Signed   By: Ashley Royalty M.D.   On: 07/08/2017 23:12   US Soft Tissue Head And Neck  Result Date: 07/07/2017 CLINICAL DATA:  Thyroid nodule  palpable neck nodule EXAM: THYROID ULTRASOUND TECHNIQUE: Ultrasound examination of the thyroid gland and adjacent soft tissues was performed. COMPARISON:  11/03/2015 FINDINGS: Parenchymal Echotexture: Mildly heterogenous Isthmus: 2 mm, unchanged Right lobe: 4.5 x 1.9 x 1.1 cm, previously 4.5 x 1.7 x 1.4 cm Left lobe: 3.9 x 1.4 x 1.1 cm, previously 3.7 x 1.5 x 1.5 70 _________________________________________________________ Estimated total number of nodules >/= 1 cm: 0 Number of spongiform nodules >/=  2 cm not described below (TR1): 0 Number of mixed cystic and solid nodules >/= 1.5 cm not described below (TR2): 0 _________________________________________________________ Incidental tiny subcentimeter scattered thyroid cystic nodules all measure 5 mm or less in size. No other significant thyroid abnormality. _________________________________________________________ Palpable left cervical abnormality correlates with a mildly prominent elongated left cervical lymph node with preserved architecture  measuring 7 mm in short axis. No other significant abnormality. IMPRESSION: Tiny subcentimeter scattered thyroid cysts. No significant thyroid abnormality that warrants biopsy or follow-up Nonspecific  prominent left cervical lymph nodes correlate with the palpable abnormality on exam. The above is in keeping with the ACR TI-RADS recommendations - J Am Coll Radiol 2017;14:587-595. Electronically Signed   By: Jerilynn Mages.  Shick M.D.   On: 07/07/2017 10:13    Procedures Procedures (including critical care time)  Medications Ordered in ED Medications  HYDROmorphone (DILAUDID) injection 0.5 mg (0.5 mg Intravenous Given 07/08/17 2153)  iopamidol (ISOVUE-370) 76 % injection 100 mL (100 mLs Intravenous Contrast Given 07/08/17 2252)     Initial Impression / Assessment and Plan / ED Course  I have reviewed the triage vital signs and the nursing notes.  Pertinent labs & imaging results that were available during my care of the patient were reviewed by me and considered in my medical decision making (see chart for details).     46 year old female who presents with left upper back pain and shortness breath that is reminiscent of prior PE. She is borderline tachycardic here in the ED. Breathing comfortably on room air. Speaking in full sentences with normal oxygenation.  Mildly reproducible muscle tenderness of the left upper back. EKG is not acutely ischemic. Troponin x 1 negative but symptoms not concerning for ACS. She did undergo CT imaging of the chest to rule out PE. There are no acute intrathoracic processes. Presentation likely musculoskeletal pain. Discussed supportive care instructions. She'll follow-up with her PCP. Strict return and follow-up instructions reviewed. She expressed understanding of all discharge instructions and felt comfortable with the plan of care.   Final Clinical Impressions(s) / ED Diagnoses   Final diagnoses:  Musculoskeletal back pain    New Prescriptions New Prescriptions    No medications on file     Forde Dandy, MD 07/08/17 2327

## 2017-07-09 ENCOUNTER — Ambulatory Visit (INDEPENDENT_AMBULATORY_CARE_PROVIDER_SITE_OTHER): Payer: BLUE CROSS/BLUE SHIELD | Admitting: Pediatrics

## 2017-07-09 ENCOUNTER — Encounter: Payer: Self-pay | Admitting: Pediatrics

## 2017-07-09 ENCOUNTER — Telehealth: Payer: Self-pay | Admitting: Pediatrics

## 2017-07-09 VITALS — BP 121/82 | HR 94 | Temp 96.7°F | Ht 62.0 in | Wt 152.0 lb

## 2017-07-09 DIAGNOSIS — M797 Fibromyalgia: Secondary | ICD-10-CM | POA: Diagnosis not present

## 2017-07-09 DIAGNOSIS — Z79899 Other long term (current) drug therapy: Secondary | ICD-10-CM

## 2017-07-09 DIAGNOSIS — M542 Cervicalgia: Secondary | ICD-10-CM | POA: Diagnosis not present

## 2017-07-09 DIAGNOSIS — J4540 Moderate persistent asthma, uncomplicated: Secondary | ICD-10-CM

## 2017-07-09 DIAGNOSIS — E282 Polycystic ovarian syndrome: Secondary | ICD-10-CM

## 2017-07-09 LAB — HEMOGLOBINOPATHY EVALUATION
HGB C: 0 %
HGB S: 0 %
HGB VARIANT: 0 %
Hemoglobin A2 Quantitation: 1.9 % (ref 1.8–3.2)
Hemoglobin F Quantitation: 0 % (ref 0.0–2.0)
Hgb A: 98.1 % (ref 96.4–98.8)

## 2017-07-09 LAB — CARDIOLIPIN ANTIBODIES, IGG, IGM, IGA
Anticardiolipin Ab,IgA,Qn: <9 APL U/mL (ref 0–11)
Anticardiolipin Ab,IgG,Qn: <9 GPL U/mL (ref 0–14)
Anticardiolipin Ab,IgM,Qn: <9 MPL U/mL (ref 0–12)

## 2017-07-09 LAB — BETA-2-GLYCOPROTEIN I ABS, IGG/M/A
Beta-2 Glyco 1 IgA: <9 GPI IgA units (ref 0–25)
Beta-2 Glyco 1 IgM: <9 GPI IgM units (ref 0–32)
Beta-2 Glycoprotein I Ab, IgG: <9 GPI IgG units (ref 0–20)

## 2017-07-09 MED ORDER — BUDESONIDE-FORMOTEROL FUMARATE 160-4.5 MCG/ACT IN AERO: 1.0000 | INHALATION_SPRAY | Freq: Two times a day (BID) | RESPIRATORY_TRACT | Status: DC

## 2017-07-09 NOTE — Progress Notes (Signed)
Subjective:   Patient ID: Jennifer Mullins, female    DOB: 01-14-1971, 46 y.o.   MRN: [id number] CC: Hospitalization Follow-up and Testosterone  HPI: Jennifer Mullins is a 46 y.o. female presenting for Hospitalization Follow-up and Testosterone  Seen in ED yesterday for acute onset sharp pain in back Came on with bending over Had happened once before several days before but resolved quickly Yesterday pain continued so went to ED Labs stable, CTA neg for PE No pain now Back pain diagnosed as musculoskeletal related yesterday in ED with normal scan  Pt recently off of eliquis following 6 mo of treatment for provoked PE after neck surgery  Follows with allergist for multiple allergies  Continues to have aches and pains in legs, toes, back, arms that affect daily life Both joints and muscles affected Has had neg ANA, ESR, RF, antiCCP ab, CRP several months ago Symptoms ongoing for years No redness or swelling in joints with the pain per pt or on exam in the past Has a diagnosis of fibromyalgia, on cymbalta On tylenol #3, attempts to wean following neck surgery have not been successful due to ongoing pain 76m cymbalta made her feel not herself, on 366mnow Has tried gabapentin and flexeril in the past, was intolerant of side effects  Wants testosterone tested today  No fevers  Relevant past medical, surgical, family and social history reviewed. Allergies and medications reviewed and updated. History  Smoking Status  . Former Smoker  . Packs/day: 0.25  . Years: 5.00  . Types: Cigarettes  . Quit date: 11/18/1989  Smokeless Tobacco  . Never Used   ROS: Per HPI   Objective:    BP 121/82   Pulse 94   Temp (!) 96.7 F (35.9 C) (Oral)   Ht 5' 2" (1.575 m)   Wt 152 lb (68.9 kg)   LMP 06/19/2017   BMI 27.80 kg/m   Wt Readings from Last 3 Encounters:  07/09/17 152 lb (68.9 kg)  07/08/17 150 lb (68 kg)  07/07/17 150 lb 1.6 oz (68.1 kg)    Gen: NAD, alert, cooperative with  exam, NCAT EYES: EOMI, no conjunctival injection, or no icterus CV: NRRR, normal S1/S2, no murmur, distal pulses 2+ b/l Resp: CTABL, no wheezes, normal WOB Abd: +BS, soft, NT, mildly distended. no guarding or organomegaly Ext: No edema, warm Neuro: Alert and oriented MSK: ttp L mid back paraspinal muscles  Assessment & Plan:  Jennifer Mullins seen today for hospitalization follow-up and testosterone.  Diagnoses and all orders for this visit:  Fibromyalgia On cymbalta 3020mhasnt tolerated increase Discussed continuing exercise plan/regimen Now off of eliquis can try NSAIDs for aching Continue heat, ice Declines trial of gabapentin, flexeril, or lyrica today -     Ambulatory referral to Pain Clinic  Asthma, moderate persistent, well-controlled Stable, cont below -     budesonide-formoterol (SYMBICORT) 160-4.5 MCG/ACT inhaler; Inhale 1-2 puffs into the lungs 2 (two) times daily.  Neck pain Ongoing, tylenol #3 helps, improves QOL and baility to do ADLs  Controlled substance agreement signed Cont tylenol #3, last Rx written for 8/2 with 2 refills, #60 tabs -     ToxASSURE Select 13 (MW), Urine  PCOS (polycystic ovarian syndrome) Requests testosterone levels checked today, discussed would not change current treatment plan based on results, pt still wants tested -     Testosterone, free  I spent 40 minutes with the patient with over 50% of the encounter time dedicated to counseling on the above  problems.   Follow up plan: As scheduled, 2 mo [name], MD [place]

## 2017-07-09 NOTE — Telephone Encounter (Signed)
Spoke with Dr. Neldon Mc, will refer patient back to Dr. Thornell Mule for a throat check. Also patient can have her PCP or the patient find her another endo doctor. Patient doesn't need to be seen at the pulmonologist.

## 2017-07-09 NOTE — Telephone Encounter (Signed)
Dr. Thornell Mule now only deals with Audiology and Ear Disorders. I decided to send the patient to Dr. Benjamine Mola for throat check. Her appt is Sep 18. Patient notified. Patient also stated she will stay with Dr. Dorris Fetch, since we are all in the same network.

## 2017-07-10 NOTE — Telephone Encounter (Signed)
Spoke to pt and she wanted to let me know one of her meds will need a PA and I advised her to take it to the pharmacy and when she tries to fill it that will start the PA process and then she wanted to verify directions on the Losartan and Periactin. Directions verified and pt voiced understanding.

## 2017-07-11 ENCOUNTER — Ambulatory Visit (INDEPENDENT_AMBULATORY_CARE_PROVIDER_SITE_OTHER): Payer: BLUE CROSS/BLUE SHIELD

## 2017-07-11 ENCOUNTER — Telehealth: Payer: Self-pay | Admitting: Pediatrics

## 2017-07-11 DIAGNOSIS — J309 Allergic rhinitis, unspecified: Secondary | ICD-10-CM

## 2017-07-11 DIAGNOSIS — M542 Cervicalgia: Secondary | ICD-10-CM

## 2017-07-11 LAB — FACTOR 5 LEIDEN

## 2017-07-11 LAB — TESTOSTERONE, FREE: Testosterone, Free: 0.7 pg/mL (ref 0.0–4.2)

## 2017-07-11 MED ORDER — ACETAMINOPHEN-CODEINE #3 300-30 MG PO TABS: 1.0000 | ORAL_TABLET | Freq: Two times a day (BID) | ORAL | 1 refills | Status: DC | PRN

## 2017-07-11 NOTE — Telephone Encounter (Signed)
PT can take 1-2 tabs of tyelnol #3 every 12h as needed. No more than 4 tabs in one day. Must be seen for further refills.

## 2017-07-11 NOTE — Telephone Encounter (Signed)
Please advise 

## 2017-07-13 LAB — TOXASSURE SELECT 13 (MW), URINE

## 2017-07-14 ENCOUNTER — Other Ambulatory Visit: Payer: Self-pay | Admitting: Allergy and Immunology

## 2017-07-14 ENCOUNTER — Telehealth: Payer: Self-pay

## 2017-07-14 ENCOUNTER — Ambulatory Visit (INDEPENDENT_AMBULATORY_CARE_PROVIDER_SITE_OTHER): Payer: BLUE CROSS/BLUE SHIELD

## 2017-07-14 DIAGNOSIS — J309 Allergic rhinitis, unspecified: Secondary | ICD-10-CM | POA: Diagnosis not present

## 2017-07-14 LAB — PROTHROMBIN GENE MUTATION

## 2017-07-14 NOTE — Telephone Encounter (Signed)
Pt wanting lab results. To be printed at appt on Wednesday for IV iron replacement. Only notable lab was low iron. Pt verbalized understanding. RN in infusion to print labs from last week for pt review. To call office with any further questions regarding lab work.

## 2017-07-16 ENCOUNTER — Ambulatory Visit (HOSPITAL_BASED_OUTPATIENT_CLINIC_OR_DEPARTMENT_OTHER): Payer: BLUE CROSS/BLUE SHIELD

## 2017-07-16 VITALS — BP 142/70 | HR 83 | Temp 99.1°F | Resp 18

## 2017-07-16 DIAGNOSIS — D500 Iron deficiency anemia secondary to blood loss (chronic): Secondary | ICD-10-CM | POA: Diagnosis not present

## 2017-07-16 MED ORDER — FAMOTIDINE 20 MG PO TABS
ORAL_TABLET | ORAL | Status: AC
  Filled 2017-07-16: qty 1

## 2017-07-16 MED ORDER — SODIUM CHLORIDE 0.9 % IV SOLN
750.0000 mg | Freq: Once | INTRAVENOUS | Status: AC
  Administered 2017-07-16: 750 mg via INTRAVENOUS
  Filled 2017-07-16: qty 15

## 2017-07-16 MED ORDER — FAMOTIDINE 20 MG PO TABS
20.0000 mg | ORAL_TABLET | Freq: Once | ORAL | Status: AC
  Administered 2017-07-16: 20 mg via ORAL

## 2017-07-16 MED ORDER — DIPHENHYDRAMINE HCL 25 MG PO CAPS
ORAL_CAPSULE | ORAL | Status: AC
  Filled 2017-07-16: qty 2

## 2017-07-16 MED ORDER — DIPHENHYDRAMINE HCL 25 MG PO TABS
50.0000 mg | ORAL_TABLET | Freq: Once | ORAL | Status: AC
  Administered 2017-07-16: 50 mg via ORAL
  Filled 2017-07-16: qty 2

## 2017-07-16 NOTE — Patient Instructions (Signed)
Ferric carboxymaltose injection What is this medicine? FERRIC CARBOXYMALTOSE (ferr-ik car-box-ee-mol-toes) is an iron complex. Iron is used to make healthy red blood cells, which carry oxygen and nutrients throughout the body. This medicine is used to treat anemia in people with chronic kidney disease or people who cannot take iron by mouth. This medicine may be used for other purposes; ask your health care provider or pharmacist if you have questions. COMMON BRAND NAME(S): Injectafer What should I tell my health care provider before I take this medicine? They need to know if you have any of these conditions: -anemia not caused by low iron levels -high levels of iron in the blood -liver disease -an unusual or allergic reaction to iron, other medicines, foods, dyes, or preservatives -pregnant or trying to get pregnant -breast-feeding How should I use this medicine? This medicine is for infusion into a vein. It is given by a health care professional in a hospital or clinic setting. Talk to your pediatrician regarding the use of this medicine in children. Special care may be needed. Overdosage: If you think you have taken too much of this medicine contact a poison control center or emergency room at once. NOTE: This medicine is only for you. Do not share this medicine with others. What if I miss a dose? It is important not to miss your dose. Call your doctor or health care professional if you are unable to keep an appointment. What may interact with this medicine? Do not take this medicine with any of the following medications: -deferoxamine -dimercaprol -other iron products This medicine may also interact with the following medications: -chloramphenicol -deferasirox This list may not describe all possible interactions. Give your health care provider a list of all the medicines, herbs, non-prescription drugs, or dietary supplements you use. Also tell them if you smoke, drink alcohol, or use  illegal drugs. Some items may interact with your medicine. What should I watch for while using this medicine? Visit your doctor or health care professional regularly. Tell your doctor if your symptoms do not start to get better or if they get worse. You may need blood work done while you are taking this medicine. You may need to follow a special diet. Talk to your doctor. Foods that contain iron include: whole grains/cereals, dried fruits, beans, or peas, leafy green vegetables, and organ meats (liver, kidney). What side effects may I notice from receiving this medicine? Side effects that you should report to your doctor or health care professional as soon as possible: -allergic reactions like skin rash, itching or hives, swelling of the face, lips, or tongue -breathing problems -changes in blood pressure -feeling faint or lightheaded, falls -flushing, sweating, or hot feelings Side effects that usually do not require medical attention (report to your doctor or health care professional if they continue or are bothersome): -changes in taste -constipation -dizziness -headache -nausea -pain, redness, or irritation at site where injected -vomiting This list may not describe all possible side effects. Call your doctor for medical advice about side effects. You may report side effects to FDA at 1-800-FDA-1088. Where should I keep my medicine? This drug is given in a hospital or clinic and will not be stored at home. NOTE: This sheet is a summary. It may not cover all possible information. If you have questions about this medicine, talk to your doctor, pharmacist, or health care provider.  2018 Elsevier/Gold Standard (2015-12-07 11:20:47)  

## 2017-07-22 ENCOUNTER — Other Ambulatory Visit: Payer: BLUE CROSS/BLUE SHIELD | Admitting: Orthotics

## 2017-07-22 ENCOUNTER — Ambulatory Visit (INDEPENDENT_AMBULATORY_CARE_PROVIDER_SITE_OTHER): Payer: BLUE CROSS/BLUE SHIELD

## 2017-07-22 ENCOUNTER — Telehealth: Payer: Self-pay | Admitting: Pediatrics

## 2017-07-22 ENCOUNTER — Telehealth: Payer: Self-pay | Admitting: Allergy and Immunology

## 2017-07-22 ENCOUNTER — Telehealth: Payer: Self-pay

## 2017-07-22 DIAGNOSIS — J309 Allergic rhinitis, unspecified: Secondary | ICD-10-CM

## 2017-07-22 NOTE — Telephone Encounter (Signed)
Insurance denied prior auth for Vaniqua Cream   No covered under plan

## 2017-07-22 NOTE — Telephone Encounter (Signed)
Please advise 

## 2017-07-22 NOTE — Telephone Encounter (Signed)
Would like test results that she had done on nodules in her throat/neck.

## 2017-07-22 NOTE — Telephone Encounter (Signed)
She would also like these results compared to others ultrasounds she has received in the past.  She would also like Dr. Neldon Mc to know that she has body aches all over and thinks she has fibromyalgia.

## 2017-07-22 NOTE — Telephone Encounter (Signed)
Pt notified of results Verbalizes understanding Pt wants Dr Evette Doffing to review her [place] report

## 2017-07-23 NOTE — Telephone Encounter (Signed)
Pt notified of results Verbalizes understanding 

## 2017-07-23 NOTE — Telephone Encounter (Signed)
Please let pt know

## 2017-07-23 NOTE — Telephone Encounter (Signed)
Pt.notified

## 2017-07-23 NOTE — Telephone Encounter (Signed)
Please inform patient that I will look over the studies sometime this week and get back with her. Trying to get everything completed after returning back from vacation.

## 2017-07-23 NOTE — Telephone Encounter (Signed)
She will need to talk with Dr. Dorris Fetch about the [place] results. I don't think there was anything that needed a biopsy which is good.

## 2017-07-23 NOTE — Telephone Encounter (Signed)
Patient informed. She has an appointment 08-05-17 for Nucala injection and office visit with Dr. Neldon Mc.

## 2017-07-24 ENCOUNTER — Ambulatory Visit (HOSPITAL_BASED_OUTPATIENT_CLINIC_OR_DEPARTMENT_OTHER): Payer: BLUE CROSS/BLUE SHIELD

## 2017-07-24 VITALS — BP 113/70 | HR 98 | Temp 98.9°F | Resp 18 | Ht 62.0 in

## 2017-07-24 DIAGNOSIS — D500 Iron deficiency anemia secondary to blood loss (chronic): Secondary | ICD-10-CM | POA: Diagnosis not present

## 2017-07-24 MED ORDER — DIPHENHYDRAMINE HCL 25 MG PO CAPS
ORAL_CAPSULE | ORAL | Status: AC
  Filled 2017-07-24: qty 2

## 2017-07-24 MED ORDER — DIPHENHYDRAMINE HCL 25 MG PO TABS
50.0000 mg | ORAL_TABLET | Freq: Once | ORAL | Status: AC
  Administered 2017-07-24: 50 mg via ORAL
  Filled 2017-07-24: qty 2

## 2017-07-24 MED ORDER — FAMOTIDINE 20 MG PO TABS
ORAL_TABLET | ORAL | Status: AC
  Filled 2017-07-24: qty 1

## 2017-07-24 MED ORDER — SODIUM CHLORIDE 0.9 % IV SOLN
750.0000 mg | Freq: Once | INTRAVENOUS | Status: AC
  Administered 2017-07-24: 750 mg via INTRAVENOUS
  Filled 2017-07-24: qty 15

## 2017-07-24 MED ORDER — FAMOTIDINE 20 MG PO TABS
20.0000 mg | ORAL_TABLET | Freq: Once | ORAL | Status: AC
  Administered 2017-07-24: 20 mg via ORAL

## 2017-07-24 MED ORDER — SODIUM CHLORIDE 0.9 % IV SOLN
Freq: Once | INTRAVENOUS | Status: AC
  Administered 2017-07-24: 09:00:00 via INTRAVENOUS

## 2017-07-24 NOTE — Patient Instructions (Signed)
Ferric carboxymaltose injection What is this medicine? FERRIC CARBOXYMALTOSE (ferr-ik car-box-ee-mol-toes) is an iron complex. Iron is used to make healthy red blood cells, which carry oxygen and nutrients throughout the body. This medicine is used to treat anemia in people with chronic kidney disease or people who cannot take iron by mouth. This medicine may be used for other purposes; ask your health care provider or pharmacist if you have questions. COMMON BRAND NAME(S): Injectafer What should I tell my health care provider before I take this medicine? They need to know if you have any of these conditions: -anemia not caused by low iron levels -high levels of iron in the blood -liver disease -an unusual or allergic reaction to iron, other medicines, foods, dyes, or preservatives -pregnant or trying to get pregnant -breast-feeding How should I use this medicine? This medicine is for infusion into a vein. It is given by a health care professional in a hospital or clinic setting. Talk to your pediatrician regarding the use of this medicine in children. Special care may be needed. Overdosage: If you think you have taken too much of this medicine contact a poison control center or emergency room at once. NOTE: This medicine is only for you. Do not share this medicine with others. What if I miss a dose? It is important not to miss your dose. Call your doctor or health care professional if you are unable to keep an appointment. What may interact with this medicine? Do not take this medicine with any of the following medications: -deferoxamine -dimercaprol -other iron products This medicine may also interact with the following medications: -chloramphenicol -deferasirox This list may not describe all possible interactions. Give your health care provider a list of all the medicines, herbs, non-prescription drugs, or dietary supplements you use. Also tell them if you smoke, drink alcohol, or use  illegal drugs. Some items may interact with your medicine. What should I watch for while using this medicine? Visit your doctor or health care professional regularly. Tell your doctor if your symptoms do not start to get better or if they get worse. You may need blood work done while you are taking this medicine. You may need to follow a special diet. Talk to your doctor. Foods that contain iron include: whole grains/cereals, dried fruits, beans, or peas, leafy green vegetables, and organ meats (liver, kidney). What side effects may I notice from receiving this medicine? Side effects that you should report to your doctor or health care professional as soon as possible: -allergic reactions like skin rash, itching or hives, swelling of the face, lips, or tongue -breathing problems -changes in blood pressure -feeling faint or lightheaded, falls -flushing, sweating, or hot feelings Side effects that usually do not require medical attention (report to your doctor or health care professional if they continue or are bothersome): -changes in taste -constipation -dizziness -headache -nausea -pain, redness, or irritation at site where injected -vomiting This list may not describe all possible side effects. Call your doctor for medical advice about side effects. You may report side effects to FDA at 1-800-FDA-1088. Where should I keep my medicine? This drug is given in a hospital or clinic and will not be stored at home. NOTE: This sheet is a summary. It may not cover all possible information. If you have questions about this medicine, talk to your doctor, pharmacist, or health care provider.  2018 Elsevier/Gold Standard (2015-12-07 11:20:47)  

## 2017-07-28 NOTE — Telephone Encounter (Signed)
Patient called back and wanted to go over results one more time. I went over them again. Patient acknowledged understanding of this. She also wanted to let me know that Dr. Neldon Mc is an amazing doctor because he does what the other docs have not.

## 2017-07-28 NOTE — Telephone Encounter (Signed)
Patient informed and advised of this info. Patient will discuss with RV

## 2017-07-28 NOTE — Telephone Encounter (Signed)
Please inform patient that the report stated: Tiny subcentimeter scattered thyroid cysts. No significant thyroid abnormality that warrants biopsy or follow-up. Nonspecific prominent left cervical lymph nodes correlate with the  palpable abnormality on exam. So she has a lymph node in her left neck. Requires serial evaluations. Will discuss with RV.

## 2017-07-29 ENCOUNTER — Ambulatory Visit (INDEPENDENT_AMBULATORY_CARE_PROVIDER_SITE_OTHER): Payer: Self-pay | Admitting: Orthotics

## 2017-07-29 ENCOUNTER — Ambulatory Visit (INDEPENDENT_AMBULATORY_CARE_PROVIDER_SITE_OTHER): Payer: BLUE CROSS/BLUE SHIELD | Admitting: *Deleted

## 2017-07-29 DIAGNOSIS — J309 Allergic rhinitis, unspecified: Secondary | ICD-10-CM

## 2017-07-29 DIAGNOSIS — M722 Plantar fascial fibromatosis: Secondary | ICD-10-CM

## 2017-07-29 DIAGNOSIS — M79671 Pain in right foot: Secondary | ICD-10-CM

## 2017-07-29 DIAGNOSIS — M2140 Flat foot [pes planus] (acquired), unspecified foot: Secondary | ICD-10-CM

## 2017-07-29 DIAGNOSIS — M79672 Pain in left foot: Secondary | ICD-10-CM

## 2017-07-29 DIAGNOSIS — M76829 Posterior tibial tendinitis, unspecified leg: Secondary | ICD-10-CM

## 2017-07-29 DIAGNOSIS — S9032XA Contusion of left foot, initial encounter: Secondary | ICD-10-CM

## 2017-07-29 NOTE — Progress Notes (Signed)
Patient came in today for modifications on F/O.  Temp scaphoid pad was added R and RF cushion Left.   Patient will f/up in two weeks before permanent is done

## 2017-07-30 ENCOUNTER — Telehealth: Payer: Self-pay | Admitting: Pediatrics

## 2017-07-30 ENCOUNTER — Emergency Department (HOSPITAL_COMMUNITY)
Admission: EM | Admit: 2017-07-30 | Discharge: 2017-07-30 | Disposition: A | Discharge status: Home / Self Care | Payer: BLUE CROSS/BLUE SHIELD | Attending: Emergency Medicine | Admitting: Emergency Medicine

## 2017-07-30 ENCOUNTER — Encounter (HOSPITAL_COMMUNITY): Payer: Self-pay | Admitting: Emergency Medicine

## 2017-07-30 DIAGNOSIS — Z79899 Other long term (current) drug therapy: Secondary | ICD-10-CM | POA: Insufficient documentation

## 2017-07-30 DIAGNOSIS — J0101 Acute recurrent maxillary sinusitis: Secondary | ICD-10-CM | POA: Diagnosis not present

## 2017-07-30 DIAGNOSIS — M25512 Pain in left shoulder: Secondary | ICD-10-CM | POA: Diagnosis not present

## 2017-07-30 DIAGNOSIS — I10 Essential (primary) hypertension: Secondary | ICD-10-CM | POA: Diagnosis not present

## 2017-07-30 DIAGNOSIS — G8929 Other chronic pain: Secondary | ICD-10-CM | POA: Diagnosis not present

## 2017-07-30 DIAGNOSIS — E119 Type 2 diabetes mellitus without complications: Secondary | ICD-10-CM | POA: Insufficient documentation

## 2017-07-30 DIAGNOSIS — Z7984 Long term (current) use of oral hypoglycemic drugs: Secondary | ICD-10-CM | POA: Insufficient documentation

## 2017-07-30 DIAGNOSIS — Z7982 Long term (current) use of aspirin: Secondary | ICD-10-CM | POA: Diagnosis not present

## 2017-07-30 DIAGNOSIS — Z87891 Personal history of nicotine dependence: Secondary | ICD-10-CM | POA: Diagnosis not present

## 2017-07-30 DIAGNOSIS — M2550 Pain in unspecified joint: Secondary | ICD-10-CM | POA: Insufficient documentation

## 2017-07-30 DIAGNOSIS — M791 Myalgia: Secondary | ICD-10-CM | POA: Diagnosis not present

## 2017-07-30 DIAGNOSIS — J449 Chronic obstructive pulmonary disease, unspecified: Secondary | ICD-10-CM | POA: Insufficient documentation

## 2017-07-30 DIAGNOSIS — E039 Hypothyroidism, unspecified: Secondary | ICD-10-CM | POA: Diagnosis not present

## 2017-07-30 DIAGNOSIS — J45909 Unspecified asthma, uncomplicated: Secondary | ICD-10-CM | POA: Insufficient documentation

## 2017-07-30 DIAGNOSIS — M25511 Pain in right shoulder: Secondary | ICD-10-CM | POA: Diagnosis not present

## 2017-07-30 DIAGNOSIS — M25561 Pain in right knee: Secondary | ICD-10-CM | POA: Diagnosis not present

## 2017-07-30 MED ORDER — DICLOFENAC SODIUM 1 % TD GEL: 2.0000 g | Freq: Four times a day (QID) | TRANSDERMAL | 0 refills | Status: DC

## 2017-07-30 MED ORDER — OFLOXACIN 0.3 % OT SOLN: 5.0000 [drp] | Freq: Every day | OTIC | 0 refills | Status: DC

## 2017-07-30 MED ORDER — AMOXICILLIN-POT CLAVULANATE 875-125 MG PO TABS: 1.0000 | ORAL_TABLET | Freq: Two times a day (BID) | ORAL | 0 refills | Status: DC

## 2017-07-30 NOTE — ED Provider Notes (Signed)
[facility] DEPT Provider Note   CSN: [id number] Arrival date & time: 07/30/17  1050     History   Chief Complaint Chief Complaint  Patient presents with  . Generalized Body Aches    HPI CHANEL MCADAMS is a 46 y.o. female who presents emergency department today for multiple complaints.  The patient states that over the last 12 days she has had increasing sinus pressure and pain with associated congestion, rhinorrhea, sneezing. She states that over the last 3 days she has had right ear pain. No recent swimming and does not use Q-tips. The patient denies cough, chest pain, sob, travel, sick contacts, sore throat. She states she gets sinus infections frequently.   Patient also complains of chronic arthralgias of the toes, feet, ankles, knees, hips, hands, elbows, shoulders, neck. She states that she has been seen by multiple specialists for this with many tests done. She takes ibuprofen and Tylenol 3 for this without much relief. She states that this is worse during ambulation and movement. She denies trauma, fever, chills, weight loss, night sweats, red, hot, swollen joint, numbness or tingling, weakness or new symptoms. No other complaints at this time.   HPI  Past Medical History:  Diagnosis Date  . Anemia   . Anemia   . Anxiety   . Arthritis   . Asthma   . Bilateral calcaneal spurs   . Bilateral polycystic ovarian syndrome   . Bulging lumbar disc   . Carpal tunnel syndrome, bilateral   . Conjunctivitis   . COPD (chronic obstructive pulmonary disease) (HCC)    BRONCHITIS  . Diabetes mellitus without complication ([place])    TYPE 2   DX  4-5 YRS AGO  . ETD (Eustachian tube dysfunction), bilateral    left worse than right  . Fibroids    uterine  . GAD (generalized anxiety disorder)   . GERD (gastroesophageal reflux disease)    TAKES PRESCRIPTION MEDS  . Glaucoma    BOTH EYES  . Hematoma    Hematoma of Left Hand- had to wear a split  . Hyperlipidemia   . Hypertension    . Hypothyroidism    NODULES ON THYROID  . PONV (postoperative nausea and vomiting)   . Pulmonary embolism ([place])   . Tachycardia   . Trigger finger   . Trigger finger of left thumb     Patient Active Problem List   Diagnosis Date Noted  . Iron deficiency anemia due to chronic blood loss 07/07/2017  . S/P cervical spinal fusion 02/27/2017  . Pulmonary embolism ([place]) 11/24/2016  . Dysfunctional uterine bleeding 11/24/2016  . Anticoagulated 11/24/2016  . Microcytic anemia 11/24/2016  . Tachycardia   . Vaginal bleeding   . Allergic rhinoconjunctivitis 07/28/2015  . Chronic headache 07/28/2015  . PCOS (polycystic ovarian syndrome) 06/20/2015  . 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 Hyperlipidemia LDL goal <100 06/20/2015  . GAD (generalized anxiety disorder) 05/18/2014  . GERD (gastroesophageal reflux disease) 05/18/2014  . Body fluid retention 07/07/2013  . Chronic sinusitis 07/02/2013  . Extrinsic asthma 05/20/2013  . HTN (hypertension) 05/20/2013  . Prediabetes 05/20/2013    Past Surgical History:  Procedure Laterality Date  . ANTERIOR CERVICAL DECOMP/DISCECTOMY FUSION N/A 11/13/2016   Procedure: Cervical five-six, Cervical six-seven Anterior Cervical Discectomy and Fusion, Allograft, Plate;  Surgeon: Marybelle Killings, MD;  Location: [facility];  Service: Orthopedics;  Laterality: N/A;  . BREAST SURGERY    . CARPAL TUNNEL RELEASE    . DILATION AND CURETTAGE OF  UTERUS    . EXPLORATORY LAPAROTOMY    . NASAL SINUS SURGERY  07/22/13   Dr. Redmond Pulling in [place] History    No data available       Home Medications    Prior to Admission medications   Medication Sig Start Date End Date Taking? Authorizing Provider  acetaminophen-codeine (TYLENOL #3) 300-30 MG tablet Take 1-2 tablets by mouth every 12 (twelve) hours as needed for moderate pain. 07/11/17   Eustaquio Maize, MD  albuterol (PROVENTIL) (2.5 MG/3ML) 0.083% nebulizer solution Take 2.5 mg by nebulization every 6 (six)  hours as needed for wheezing or shortness of breath.    [provider]  ALPRAZolam Duanne Moron) 0.5 MG tablet Take 1 tablet (0.5 mg total) by mouth 3 (three) times daily as needed for anxiety. 05/28/17   Eustaquio Maize, MD  amoxicillin-clavulanate (AUGMENTIN) 875-125 MG tablet Take 1 tablet by mouth 2 (two) times daily. 07/30/17   Maczis, Barth Kirks, PA-C  aspirin 81 MG chewable tablet Chew 81 mg by mouth daily.    [provider]  AUVI-Q 0.3 MG/0.3ML SOAJ injection Use as directed for life-threatening allergic reaction. 06/10/17   Kozlow, Donnamarie Poag, MD  budesonide-formoterol (SYMBICORT) 160-4.5 MCG/ACT inhaler Inhale 1-2 puffs into the lungs 2 (two) times daily. 07/09/17   Eustaquio Maize, MD  cromolyn (OPTICROM) 4 % ophthalmic solution Place 1 drop into both eyes 4 (four) times daily.  03/20/17   [provider]  cyproheptadine (PERIACTIN) 4 MG tablet TAKE 1 TABLET BY MOUTH AT BEDTIME Patient taking differently: TAKE 1/2 TABLET BY MOUTH AT BEDTIME 05/08/17   Eustaquio Maize, MD  diclofenac (VOLTAREN) 0.1 % ophthalmic solution Place 1 drop into both eyes 3 (three) times daily.  04/11/17   [provider]  diclofenac sodium (VOLTAREN) 1 % GEL Apply 2 g topically 4 (four) times daily. 07/30/17   Maczis, Barth Kirks, PA-C  DULoxetine (CYMBALTA) 30 MG capsule Take 20-30 mg by mouth every morning.  06/16/17   [provider]  fluticasone (FLONASE) 50 MCG/ACT nasal spray Place 1 spray into both nostrils 2 (two) times daily. 04/16/17   Kozlow, Donnamarie Poag, MD  furosemide (LASIX) 20 MG tablet Take 1 tablet (20 mg total) by mouth daily. Patient taking differently: Take 20 mg by mouth daily as needed for fluid.  03/12/17   Eustaquio Maize, MD  Lifitegrast Shirley Friar) 5 % SOLN Apply to eye.    [provider]  LINZESS 290 MCG CAPS capsule TAKE ONE CAPSULE BY MOUTH BEFORE BREAKFAST 07/03/17   Eustaquio Maize, MD  losartan (COZAAR) 25 MG tablet Take 1 tablet (25 mg total) by mouth  daily. Patient taking differently: Take 12.5-25 mg by mouth daily.  03/21/17   Eustaquio Maize, MD  Mepolizumab (NUCALA [place]) Inject into the skin every 28 (twenty-eight) days.    [provider]  metFORMIN (GLUCOPHAGE) 500 MG tablet Take 1 tablet (500 mg total) by mouth daily with breakfast. 05/28/17   Eustaquio Maize, MD  montelukast (SINGULAIR) 10 MG tablet Take 1 tablet (10 mg total) by mouth at bedtime. 04/16/17   Kozlow, Donnamarie Poag, MD  ofloxacin (FLOXIN) 0.3 % OTIC solution Place 5 drops into the right ear daily. 07/30/17   Maczis, Barth Kirks, PA-C  Olopatadine HCl 0.6 % SOLN 2 sprays each nostril two times per day. 04/16/17   Kozlow, Donnamarie Poag, MD  pantoprazole (PROTONIX) 40 MG tablet TAKE 1 TABLET BY MOUTH EVERY DAY  03/26/17   Eustaquio Maize, MD  PROAIR HFA 108 [id number] Base) MCG/ACT inhaler INHALE 1 TO 2 PUFFS EVERY FOUR HOURS AS NEEDED WHEEZING SHORTNESS OF BREATH 06/20/17   Eustaquio Maize, MD    Family History Family History  Problem Relation Age of Onset  . Hypertension Mother   . Hypertension Father     Social History Social History  Substance Use Topics  . Smoking status: Former Smoker    Packs/day: 0.25    Years: 5.00    Types: Cigarettes    Quit date: 11/18/1989  . Smokeless tobacco: Never Used  . Alcohol use No     Allergies   Demerol [meperidine]; Morphine and related; Diltiazem hcl; Lisinopril; and Tramadol   Review of Systems Review of Systems  All other systems reviewed and are negative.    Physical Exam Updated Vital Signs BP 131/70 (BP Location: Right Arm)   Pulse 92   Temp 99.1 F (37.3 C) (Oral)   Resp 18   Ht 5\' 2"  (1.575 m)   Wt 68.9 kg (152 lb)   LMP 07/20/2017   SpO2 99%   BMI 27.80 kg/m   Physical Exam  Constitutional: She appears well-developed and well-nourished.  HENT:  Head: Normocephalic and atraumatic.  Right Ear: Hearing, tympanic membrane and external ear normal. There is drainage and tenderness. Tympanic membrane is not  perforated.  Left Ear: Hearing, tympanic membrane, external ear and ear canal normal.  Nose: Mucosal edema present. Right sinus exhibits maxillary sinus tenderness. Right sinus exhibits no frontal sinus tenderness. Left sinus exhibits maxillary sinus tenderness. Left sinus exhibits no frontal sinus tenderness.  Mouth/Throat: Uvula is midline, oropharynx is clear and moist and mucous membranes are normal. No tonsillar exudate.  The patient has normal phonation and is in control of secretions. No stridor.  Midline uvula without edema. Soft palate rises symmetrically. No tonsillar erythema or exudates. No PTA. Tongue protrusion is normal. No trismus. No creptius on neck palpation and patient has good dentition. No gingival erythema or fluctuance noted. Mucus membranes moist.   Eyes: Pupils are equal, round, and reactive to light. Right eye exhibits no discharge. Left eye exhibits no discharge. No scleral icterus.  Neck: Trachea normal, normal range of motion and phonation normal. Neck supple. No spinous process tenderness present. No neck rigidity. Normal range of motion present.  Cardiovascular: Normal rate, regular rhythm and intact distal pulses.   No murmur heard. Pulses:      Radial pulses are 2+ on the right side, and 2+ on the left side.       Dorsalis pedis pulses are 2+ on the right side, and 2+ on the left side.       Posterior tibial pulses are 2+ on the right side, and 2+ on the left side.  No lower extremity swelling or edema. Calves symmetric in size bilaterally.  Pulmonary/Chest: Effort normal and breath sounds normal. She exhibits no tenderness.  Abdominal: Soft. Bowel sounds are normal. There is no tenderness. There is no rebound and no guarding.  Musculoskeletal: She exhibits no edema.       Right shoulder: She exhibits tenderness. She exhibits normal range of motion.       Left shoulder: She exhibits tenderness. She exhibits normal range of motion.       Right elbow: She exhibits  normal range of motion, no swelling and no effusion. Tenderness found.       Left elbow: She exhibits normal range of motion, no swelling  and no effusion. Tenderness found.       Right wrist: She exhibits tenderness. She exhibits normal range of motion, no swelling, no effusion and no deformity.       Left wrist: She exhibits tenderness. She exhibits normal range of motion, no swelling and no effusion.       Right hip: She exhibits normal range of motion and normal strength.       Left hip: She exhibits normal range of motion and normal strength.       Right knee: She exhibits normal range of motion, no swelling, no effusion, no ecchymosis, no deformity and no erythema. Tenderness found.       Left knee: She exhibits normal range of motion, no swelling, no effusion, no ecchymosis, no deformity and no erythema. Tenderness found.       Right ankle: She exhibits normal range of motion, no swelling, no ecchymosis, no deformity, no laceration and normal pulse. Tenderness. Achilles tendon normal.       Left ankle: She exhibits normal range of motion, no swelling, no ecchymosis, no deformity, no laceration and normal pulse. Tenderness. Achilles tendon normal. Achilles tendon exhibits normal Thompson's test results.       Cervical back: Normal.       Right hand: She exhibits normal range of motion and no tenderness. Normal sensation noted. Normal strength noted.       Left hand: Normal. Normal sensation noted. Normal strength noted.  Lymphadenopathy:    She has no cervical adenopathy.  Neurological: She is alert. She has normal strength. No sensory deficit. Gait normal.  Skin: Skin is warm and dry. No rash noted. She is not diaphoretic.  No erythema, swelling, she states over any joint  Psychiatric: She has a normal mood and affect.  Nursing note and vitals reviewed.    ED Treatments / Results  Labs (all labs ordered are listed, but only abnormal results are displayed) Labs Reviewed - No data to  display  EKG  EKG Interpretation None       Radiology No results found.  Procedures Procedures (including critical care time)  Medications Ordered in ED Medications - No data to display   Initial Impression / Assessment and Plan / ED Course  I have reviewed the triage vital signs and the nursing notes.  Pertinent labs & imaging results that were available during my care of the patient were reviewed by me and considered in my medical decision making (see chart for details).     46 year old female presenting with sinusitis that has been ongoing for the last 12 days. Exam consistent with this. The patient states this is recurrent. No recent abx use in last 3 months. I will prescribed Augmentin for this and have the patient follow up with PCP this week with question of ENT follow up for frequent sinusitis.   Patient also complains of diffuse joint pain for several years. On exam the patient has tenderness over ever joint with full rom and strength. There is no heat, swelling or erythema over any of the joint to make me suspect a septic joint. The patient has able gait. Upon chart review the patient has had rheumatoid panel done in the past with negative workup. The patient states she take ibuprofen and tylenol 3 for her pain. She is to be seen at a pain management. I will give her Voltaren gel for this at home and advised conservative therapy.   The evaluation does not show pathology  that would require ongoing emergent intervention or inpatient treatment. I advised the patient to follow-up with PCP this week. I advised the patient to return to the emergency department with new or worsening symptoms or new concerns. Specific return precautions discussed. The patient verbalized understanding and agreement with plan. All questions answered. No further questions at this time. The patient is hemodynamically stable, mentating appropriately and appears safe for discharge.   Final Clinical  Impressions(s) / ED Diagnoses   Final diagnoses:  Acute recurrent maxillary sinusitis  Chronic joint pain    New Prescriptions Discharge Medication List as of 07/30/2017 12:29 PM    START taking these medications   Details  amoxicillin-clavulanate (AUGMENTIN) 875-125 MG tablet Take 1 tablet by mouth 2 (two) times daily., Starting Wed 07/30/2017, Print    diclofenac sodium (VOLTAREN) 1 % GEL Apply 2 g topically 4 (four) times daily., Starting Wed 07/30/2017, Print    ofloxacin (FLOXIN) 0.3 % OTIC solution Place 5 drops into the right ear daily., Starting Wed 07/30/2017, Print         Maczis, Barth Kirks, PA-C 07/30/17 [place], [place], DO 08/04/17 1507

## 2017-07-30 NOTE — Telephone Encounter (Signed)
Left message for patient to call back. Dr. Evette Doffing has a meeting on Oct. 11th and will need to reschedule patient to 3 pm if ok

## 2017-07-30 NOTE — Discharge Instructions (Signed)
Please read and follow all provided instructions.  Your diagnoses today include:  1. Acute recurrent maxillary sinusitis   2. Chronic joint pain     Tests performed today include: Vital signs. See below for your results today.   Medications prescribed:  Please take all of your antibiotics until finished!   You may develop abdominal discomfort or diarrhea from the antibiotic.  You may help offset this with probiotics which you can buy or get in yogurt. Do not eat  or take the probiotics until 2 hours after your antibiotic.  Musinex [Guaifenesin] as a decongestant [thin mucus - you have to be well hydrated when taking this for it to work] Ear drops - use as directed for irritation.  Voltaren gel for pain. Take other medications as directed by your doctor.   Home care instructions:  Follow any educational materials contained in this packet.  Follow-up instructions: Please follow-up with your primary care provider for further evaluation of symptoms and treatment. I think you need to have evaluation by a specialist for your chronic pain.   Return instructions:  Please return to the Emergency Department if you do not get better, if you get worse, or new symptoms OR  - Fever (temperature greater than 101.28F)  - Bleeding that does not stop with holding pressure to the area    -Severe pain (please note that you may be more sore the day after your accident)  - Chest Pain  - Difficulty breathing  - Severe nausea or vomiting  - Inability to tolerate food and liquids  - Passing out  - Skin becoming red around your wounds  - Change in mental status (confusion or lethargy)  - New numbness or weakness    Please return if you have any other emergent concerns.  Additional Information:  Your vital signs today were: BP 131/70 (BP Location: Right Arm)    Pulse 92    Temp 99.1 F (37.3 C) (Oral)    Resp 18    Ht 5\' 2"  (1.575 m)    Wt 68.9 kg (152 lb)    LMP 07/20/2017    SpO2 99%    BMI 27.80  kg/m  If your blood pressure (BP) was elevated above 135/85 this visit, please have this repeated by your doctor within one month.

## 2017-07-30 NOTE — ED Triage Notes (Signed)
Pt reports right ear pain x3 days, bilateral shoulder pain, hand pain, bilateral knee pain for last several days. Pt reports history of neck surgery. Pt denies any known injury. Pt reports is currently receiving iron infusions for chronic anemia.

## 2017-07-31 ENCOUNTER — Encounter: Payer: Self-pay | Admitting: Pediatrics

## 2017-07-31 ENCOUNTER — Other Ambulatory Visit: Payer: Self-pay | Admitting: Pediatrics

## 2017-07-31 ENCOUNTER — Ambulatory Visit (INDEPENDENT_AMBULATORY_CARE_PROVIDER_SITE_OTHER): Payer: BLUE CROSS/BLUE SHIELD | Admitting: Pediatrics

## 2017-07-31 ENCOUNTER — Other Ambulatory Visit: Payer: Self-pay | Admitting: *Deleted

## 2017-07-31 VITALS — BP 125/81 | HR 101 | Temp 98.9°F | Ht 62.0 in | Wt 152.2 lb

## 2017-07-31 DIAGNOSIS — M542 Cervicalgia: Secondary | ICD-10-CM

## 2017-07-31 DIAGNOSIS — B379 Candidiasis, unspecified: Secondary | ICD-10-CM | POA: Diagnosis not present

## 2017-07-31 DIAGNOSIS — H60503 Unspecified acute noninfective otitis externa, bilateral: Secondary | ICD-10-CM | POA: Diagnosis not present

## 2017-07-31 DIAGNOSIS — J329 Chronic sinusitis, unspecified: Secondary | ICD-10-CM

## 2017-07-31 DIAGNOSIS — M797 Fibromyalgia: Secondary | ICD-10-CM | POA: Diagnosis not present

## 2017-07-31 DIAGNOSIS — K5900 Constipation, unspecified: Secondary | ICD-10-CM

## 2017-07-31 MED ORDER — AMOXICILLIN-POT CLAVULANATE 875-125 MG PO TABS: 1.0000 | ORAL_TABLET | Freq: Two times a day (BID) | ORAL | 0 refills | Status: DC

## 2017-07-31 MED ORDER — DICLOFENAC SODIUM 1 % TD GEL: 2.0000 g | Freq: Four times a day (QID) | TRANSDERMAL | 0 refills | Status: DC

## 2017-07-31 MED ORDER — ACETAMINOPHEN-CODEINE #3 300-30 MG PO TABS: 1.0000 | ORAL_TABLET | Freq: Four times a day (QID) | ORAL | Status: DC | PRN

## 2017-07-31 MED ORDER — CIPROFLOXACIN-DEXAMETHASONE 0.3-0.1 % OT SUSP: 4.0000 [drp] | Freq: Two times a day (BID) | OTIC | 0 refills | Status: DC

## 2017-07-31 MED ORDER — FLUCONAZOLE 150 MG PO TABS: 150.0000 mg | ORAL_TABLET | ORAL | 0 refills | Status: DC | PRN

## 2017-07-31 NOTE — Progress Notes (Signed)
Subjective:   Patient ID: Jennifer Mullins, female    DOB: 01/06/1971, 46 y.o.   MRN: [id number] CC: Follow-up med problems and ED visit HPI: Jennifer Mullins is a 46 y.o. female presenting for Follow-up  Yesterday R TM bothering her Was seen in emergency room Treated with topical antibiotic for for ear pain, started on augmentin for sinusitis  Today says she feels about the same No fevers Appetite has bene ok Has been eating a lot of sugary foods recently Remains concerned about her weight gain On periactin for help with control of her allergies, has been able to decrease to 1/2 tab daily, following with allergy and immunology  Fibromyalgia: diclofenac gel helping some Bothered with muscle cramps, leg pain often at night Rubbing legs helps sometimes Takes tylenol with codeine up to 4 tabs a day Has referral in to pain management  Relevant past medical, surgical, family and social history reviewed. Allergies and medications reviewed and updated. History  Smoking Status  . Former Smoker  . Packs/day: 0.25  . Years: 5.00  . Types: Cigarettes  . Quit date: 11/18/1989  Smokeless Tobacco  . Never Used   ROS: Per HPI   Objective:    BP 125/81   Pulse (!) 101   Temp 98.9 F (37.2 C) (Oral)   Ht 5\' 2"  (1.575 m)   Wt 152 lb 3.2 oz (69 kg)   LMP 07/20/2017   BMI 27.84 kg/m   Wt Readings from Last 3 Encounters:  08/01/17 153 lb 6.4 oz (69.6 kg)  07/31/17 152 lb 3.2 oz (69 kg)  07/30/17 152 lb (68.9 kg)    Gen: NAD, alert, cooperative with exam, NCAT EYES: EOMI, no conjunctival injection, or no icterus ENT:  B/l ear canals slightly red, irritated, nl TMs b/l, pearly gray. OP without erythema LYMPH: no cervical LAD CV: NRRR, normal S1/S2, no murmur, distal pulses 2+ b/l Resp: CTABL, no wheezes, normal WOB Abd: +BS, soft, NTND.  Ext: No edema, warm Neuro: Alert and oriented, strength equal b/l UE and LE, coordination grossly normal MSK: no swelling hands, feet, knees,  wrists Tender with gentle palpation throughout joints  Assessment & Plan:  Jennifer Mullins was seen today for follow-up med problems and ED visits  Diagnoses and all orders for this visit:  Yeast infection With abx gets yeast infections, treat with below prn -     fluconazole (DIFLUCAN) 150 MG tablet; Take 1 tablet (150 mg total) by mouth every three (3) days as needed.  Neck pain Ongoing pain, has upcoming appt with pain management Have discussed that not able to increase pain medicine from this clinic with our ongoing prescribing of xanax for anxiety. Pt does not want to discontinue xanax. Does have some relief with the tylenol with codeine OK to take 4 tabs of tylenol with codeine in a day, can do q6h, has Rx with refill, enough for next appt to see me in Oct -     acetaminophen-codeine (TYLENOL #3) 300-30 MG tablet; Take 1-2 tablets by mouth every 6 (six) hours as needed for moderate pain.  Sinusitis, unspecified chronicity, unspecified location Started treatment yesterday, will give enough augmentin to cover 10 days of treatment -     amoxicillin-clavulanate (AUGMENTIN) 875-125 MG tablet; Take 1 tablet by mouth 2 (two) times daily.  Acute otitis externa of both ears, unspecified type Irritated ear canals b/l with redness, tenderness Treat with below -     ciprofloxacin-dexamethasone (CIPRODEX) OTIC suspension; Place 4 drops into both  ears 2 (two) times daily.  Fibromyalgia Cont cymbalta Discussed importance of regular exercise program, OK to start with slow walking for 5-10 min  -     diclofenac sodium (VOLTAREN) 1 % GEL; Apply 2 g topically 4 (four) times daily.   Follow up plan: As scheduled [name], MD [place]

## 2017-08-01 ENCOUNTER — Telehealth: Payer: Self-pay | Admitting: *Deleted

## 2017-08-01 ENCOUNTER — Telehealth: Payer: Self-pay | Admitting: Pediatrics

## 2017-08-01 ENCOUNTER — Encounter: Payer: Self-pay | Admitting: Pediatrics

## 2017-08-01 ENCOUNTER — Ambulatory Visit (INDEPENDENT_AMBULATORY_CARE_PROVIDER_SITE_OTHER): Payer: BLUE CROSS/BLUE SHIELD | Admitting: Pediatrics

## 2017-08-01 VITALS — BP 124/78 | HR 88 | Temp 98.6°F | Ht 62.0 in | Wt 153.4 lb

## 2017-08-01 DIAGNOSIS — M25542 Pain in joints of left hand: Secondary | ICD-10-CM | POA: Diagnosis not present

## 2017-08-01 DIAGNOSIS — M2540 Effusion, unspecified joint: Secondary | ICD-10-CM

## 2017-08-01 MED ORDER — NAPROXEN 500 MG PO TABS: 500.0000 mg | ORAL_TABLET | Freq: Two times a day (BID) | ORAL | 0 refills | Status: DC

## 2017-08-01 NOTE — Telephone Encounter (Signed)
Seen today in clinic.

## 2017-08-01 NOTE — Telephone Encounter (Signed)
Pt called to request her cholesterol be checked with labs that were drawn today Please advise

## 2017-08-01 NOTE — Telephone Encounter (Signed)
Patient would like something else for pain. Does she need to come back in? Please advise and route to pool B

## 2017-08-01 NOTE — Progress Notes (Signed)
  Subjective:   Patient ID: Jennifer Mullins, female    DOB: 1971-03-19, 46 y.o.   MRN: [id number] CC: Otalgia and Generalized Body Aches  HPI: Jennifer Mullins is a 46 y.o. female presenting for Otalgia and Generalized Body Aches   Seen yesterday in clinic for f/u from ED seen for sinusitis Also had joint aching in ED Says last night joint aching returned Woke up this morning and L hand was more swollen Doesn't remember having swelling in joints before Has had neg ANA, RF, antiCCP, normal CRP, ESR as recently as 5-04/2017 Hips, wrists, shoulders, L thumb all bothering her today Not taking PO anti-inflammatory now  Has had trigger finger L thumb in past, pain in thumb reminds her of that pain, feels "like its catching"  No fevers or chills Appetite unchanged  Continues to have aching in ears b/l Now on ciprodex drops and augmentin x 2 days  Relevant past medical, surgical, family and social history reviewed. Allergies and medications reviewed and updated. History  Smoking Status  . Former Smoker  . Packs/day: 0.25  . Years: 5.00  . Types: Cigarettes  . Quit date: 11/18/1989  Smokeless Tobacco  . Never Used   ROS: Per HPI   Objective:    BP 124/78   Pulse 88   Temp 98.6 F (37 C) (Oral)   Ht _0  (1.575 m)   Wt 153 lb 6.4 oz (69.6 kg)   LMP 07/20/2017   BMI 28.06 kg/m   Wt Readings from Last 3 Encounters:  08/01/17 153 lb 6.4 oz (69.6 kg)  07/31/17 152 lb 3.2 oz (69 kg)  07/30/17 152 lb (68.9 kg)    Gen: NAD, alert, cooperative with exam, NCAT, slow antalgic gait EYES: EOMI, no conjunctival injection, or no icterus ENT:  TMs pearly gray b/l, canals slightly pink, canals still ttp with moving of ears, sensitive with otoscope exam, OP without erythema LYMPH: no cervical LAD CV: NRRR, normal S1/S2, no murmur, distal pulses 2+ b/l Resp: CTABL, no wheezes, normal WOB Ext: No edema, warm Neuro: Alert and oriented MSK: slight swelling of L hand including MCP, PIP, DIP  joints compared with R Pain with ROM wrists b/l, elbows b/l No redness over joints Pain limiting making fist with L hand  Assessment & Plan:  Jennifer Mullins was seen today for otalgia and joint aches.  Diagnoses and all orders for this visit:  Joint swelling New since yesterday Will recheck select labs No fevers, other VS nl -     CBC with Differential/Platelet -     Uric acid -     Sedimentation rate -     C-reactive protein  Arthralgia of left hand Cont tylenol with codeine as prescribed, start below, take with food -     naproxen (NAPROSYN) 500 MG tablet; Take 1 tablet (500 mg total) by mouth 2 (two) times daily with a meal.   Follow up plan: As scheduled [name], MD [place]

## 2017-08-01 NOTE — Addendum Note (Signed)
Addended by: Wardell Heath on: 08/01/2017 10:43 AM   Modules accepted: Orders

## 2017-08-02 LAB — CBC WITH DIFFERENTIAL/PLATELET
Basophils Absolute: 0 10*3/uL (ref 0.0–0.2)
Basos: 0 %
EOS (ABSOLUTE): 0 10*3/uL (ref 0.0–0.4)
Eos: 0 %
Hematocrit: 36.6 % (ref 34.0–46.6)
Hemoglobin: 11.7 g/dL (ref 11.1–15.9)
Immature Grans (Abs): 0 10*3/uL (ref 0.0–0.1)
Immature Granulocytes: 0 %
Lymphocytes Absolute: 1.5 10*3/uL (ref 0.7–3.1)
Lymphs: 33 %
MCH: 25.7 pg — ABNORMAL LOW (ref 26.6–33.0)
MCHC: 32 g/dL (ref 31.5–35.7)
MCV: 80 fL (ref 79–97)
Monocytes Absolute: 0.3 10*3/uL (ref 0.1–0.9)
Monocytes: 7 %
Neutrophils Absolute: 2.8 10*3/uL (ref 1.4–7.0)
Neutrophils: 60 %
Platelets: 226 10*3/uL (ref 150–379)
RBC: 4.55 x10E6/uL (ref 3.77–5.28)
RDW: 28.6 % — ABNORMAL HIGH (ref 12.3–15.4)
WBC: 4.7 10*3/uL (ref 3.4–10.8)

## 2017-08-02 LAB — SEDIMENTATION RATE: Sed Rate: 8 mm/hr (ref 0–32)

## 2017-08-02 LAB — URIC ACID: Uric Acid: 4.5 mg/dL (ref 2.5–7.1)

## 2017-08-02 LAB — C-REACTIVE PROTEIN: CRP: 2.8 mg/L (ref 0.0–4.9)

## 2017-08-04 DIAGNOSIS — H1013 Acute atopic conjunctivitis, bilateral: Secondary | ICD-10-CM | POA: Diagnosis not present

## 2017-08-04 DIAGNOSIS — J4550 Severe persistent asthma, uncomplicated: Secondary | ICD-10-CM | POA: Diagnosis not present

## 2017-08-04 LAB — LIPID PANEL
Chol/HDL Ratio: 4.7 ratio — ABNORMAL HIGH (ref 0.0–4.4)
Cholesterol, Total: 208 mg/dL — ABNORMAL HIGH (ref 100–199)
HDL: 44 mg/dL (ref >39)
LDL Calculated: 129 mg/dL — ABNORMAL HIGH (ref 0–99)
Triglycerides: 175 mg/dL — ABNORMAL HIGH (ref 0–149)
VLDL Cholesterol Cal: 35 mg/dL (ref 5–40)

## 2017-08-04 LAB — SPECIMEN STATUS REPORT

## 2017-08-05 ENCOUNTER — Encounter: Payer: Self-pay | Admitting: Allergy and Immunology

## 2017-08-05 ENCOUNTER — Other Ambulatory Visit: Payer: BLUE CROSS/BLUE SHIELD | Admitting: Orthotics

## 2017-08-05 ENCOUNTER — Ambulatory Visit (INDEPENDENT_AMBULATORY_CARE_PROVIDER_SITE_OTHER): Payer: BLUE CROSS/BLUE SHIELD | Admitting: Allergy and Immunology

## 2017-08-05 ENCOUNTER — Ambulatory Visit: Payer: BLUE CROSS/BLUE SHIELD

## 2017-08-05 ENCOUNTER — Telehealth: Payer: Self-pay | Admitting: Pediatrics

## 2017-08-05 VITALS — BP 118/70 | HR 103 | Resp 18

## 2017-08-05 DIAGNOSIS — Q892 Congenital malformations of other endocrine glands: Secondary | ICD-10-CM | POA: Diagnosis not present

## 2017-08-05 DIAGNOSIS — D440 Neoplasm of uncertain behavior of thyroid gland: Secondary | ICD-10-CM | POA: Diagnosis not present

## 2017-08-05 DIAGNOSIS — K219 Gastro-esophageal reflux disease without esophagitis: Secondary | ICD-10-CM

## 2017-08-05 DIAGNOSIS — R519 Headache, unspecified: Secondary | ICD-10-CM

## 2017-08-05 DIAGNOSIS — J310 Chronic rhinitis: Secondary | ICD-10-CM | POA: Diagnosis not present

## 2017-08-05 DIAGNOSIS — J4550 Severe persistent asthma, uncomplicated: Secondary | ICD-10-CM

## 2017-08-05 DIAGNOSIS — H9209 Otalgia, unspecified ear: Secondary | ICD-10-CM | POA: Diagnosis not present

## 2017-08-05 DIAGNOSIS — J320 Chronic maxillary sinusitis: Secondary | ICD-10-CM | POA: Diagnosis not present

## 2017-08-05 DIAGNOSIS — J3089 Other allergic rhinitis: Secondary | ICD-10-CM | POA: Diagnosis not present

## 2017-08-05 DIAGNOSIS — R51 Headache: Secondary | ICD-10-CM

## 2017-08-05 DIAGNOSIS — J343 Hypertrophy of nasal turbinates: Secondary | ICD-10-CM | POA: Diagnosis not present

## 2017-08-05 NOTE — Telephone Encounter (Signed)
Informed patient that cholesterol is elevated.  Patient states that she will start back taking pravastatin 20mg  daily.

## 2017-08-05 NOTE — Progress Notes (Signed)
Follow-up Note  Referring Provider: Eustaquio Maize, MD Primary Provider: Eustaquio Maize, MD Date of Office Visit: 08/05/2017  Subjective:   Jennifer Mullins (DOB: 1971/07/30) is a 46 y.o. female who returns to the [facility] [place] on 08/05/2017 in re-evaluation of the following:  HPI: Jennifer Mullins presents to this clinic in evaluation of multiple issues. She is followed in this clinic for asthma, allergic rhinoconjunctivitis, vernal and allergic conjunctivitis, headache disorder, LPR, ETD, and she has a history of thyroglossal duct cyst, iron deficiency anemia, and pulmonary embolus that we have also addressed to some degree.  She has several issues that are active. First, her right ear has been bothering her with the development of "aches". She has apparently been given Augmentin and eardrops on the right ear by the urgent care center and because nothing changed she went to visit with her family doctor who extended her Augmentin and gave her a different type of ear drop. She has a appointment to see an ENT doctor today or tomorrow.  She complains of having intermittent and diffuse achiness of her joints. Apparently this has been an issue that has been ongoing now for years but has become more significant over the course of the past 8 months or so. Apparently she had labs checked which did not identify a significant rheumatic disease.  She has had no problems with asthma and does not need to use a short acting bronchodilator.  She has really had no significant problems with her nose other than some intermittent nasal congestion.  She does not believe that the cyproheptadine is helping her chronic daily headaches although it has resulted in resolution of any bad migraines. She would like to taper off the cyproheptadine now that she has modified her caffeine and chocolate consumption to see if she needs this therapy anymore to prevent migraines.  Her eye disease followed by Dr. Delman Cheadle  is under very good control with a combination of eyedrops including diclofenac, cromolyn, and a dry eye drop.  Her reflux is under excellent control at this point in time and she no longer uses a H2 receptor blocker.  Allergies as of 08/05/2017      Reactions   Demerol [meperidine] Shortness Of Breath   FLUSHING AND SHORTNESS OF BREATH   Morphine And Related Shortness Of Breath   Flushed and hot hyper   Diltiazem Hcl Hives   Lisinopril    Caused cough   Tramadol Other (See Comments)   Tears up stomach      Medication List      acetaminophen-codeine 300-30 MG tablet Commonly known as:  TYLENOL #3 Take 1-2 tablets by mouth every 6 (six) hours as needed for moderate pain.   albuterol (2.5 MG/3ML) 0.083% nebulizer solution Commonly known as:  PROVENTIL Take 2.5 mg by nebulization every 6 (six) hours as needed for wheezing or shortness of breath.   PROAIR HFA 108 (90 Base) MCG/ACT inhaler Generic drug:  albuterol INHALE 1 TO 2 PUFFS EVERY FOUR HOURS AS NEEDED WHEEZING SHORTNESS OF BREATH   ALPRAZolam 0.5 MG tablet Commonly known as:  XANAX Take 1 tablet (0.5 mg total) by mouth 3 (three) times daily as needed for anxiety.   amoxicillin-clavulanate 875-125 MG tablet Commonly known as:  AUGMENTIN Take 1 tablet by mouth 2 (two) times daily.   aspirin 81 MG chewable tablet Chew 81 mg by mouth daily.   AUVI-Q 0.3 mg/0.3 mL [place] injection Generic drug:  EPINEPHrine Use as directed  for life-threatening allergic reaction.   budesonide-formoterol 160-4.5 MCG/ACT inhaler Commonly known as:  SYMBICORT Inhale 1-2 puffs into the lungs 2 (two) times daily.   ciprofloxacin-dexamethasone OTIC suspension Commonly known as:  CIPRODEX Place 4 drops into both ears 2 (two) times daily.   cromolyn 4 % ophthalmic solution Commonly known as:  OPTICROM Place 1 drop into both eyes 4 (four) times daily.   cyproheptadine 4 MG tablet Commonly known as:  PERIACTIN TAKE 1 TABLET BY MOUTH AT  BEDTIME   diclofenac 0.1 % ophthalmic solution Commonly known as:  VOLTAREN Place 1 drop into both eyes 3 (three) times daily.   diclofenac sodium 1 % Gel Commonly known as:  VOLTAREN Apply 2 g topically 4 (four) times daily.   DULoxetine 30 MG capsule Commonly known as:  CYMBALTA Take 20-30 mg by mouth every morning.   fluconazole 150 MG tablet Commonly known as:  DIFLUCAN Take 1 tablet (150 mg total) by mouth every three (3) days as needed.   fluticasone 50 MCG/ACT nasal spray Commonly known as:  FLONASE Place 1 spray into both nostrils 2 (two) times daily.   furosemide 20 MG tablet Commonly known as:  LASIX Take 1 tablet (20 mg total) by mouth daily.   LINZESS 145 MCG Caps capsule Generic drug:  linaclotide TAKE ONE CAPSULE BY MOUTH EVERY DAY BEFORE BREAKFAST   losartan 25 MG tablet Commonly known as:  COZAAR Take 1 tablet (25 mg total) by mouth daily.   metFORMIN 500 MG tablet Commonly known as:  GLUCOPHAGE Take 1 tablet (500 mg total) by mouth daily with breakfast.   montelukast 10 MG tablet Commonly known as:  SINGULAIR Take 1 tablet (10 mg total) by mouth at bedtime.   NUCALA [place] Inject into the skin every 28 (twenty-eight) days.   Olopatadine HCl 0.6 % Soln 2 sprays each nostril two times per day.   pantoprazole 40 MG tablet Commonly known as:  PROTONIX TAKE 1 TABLET BY MOUTH EVERY DAY   XIIDRA 5 % Soln Generic drug:  Lifitegrast Apply to eye.     Past Medical History:  Diagnosis Date  . Anemia   . Anemia   . Anxiety   . Arthritis   . Asthma   . Bilateral calcaneal spurs   . Bilateral polycystic ovarian syndrome   . Bulging lumbar disc   . Carpal tunnel syndrome, bilateral   . Conjunctivitis   . COPD (chronic obstructive pulmonary disease) (HCC)    BRONCHITIS  . Diabetes mellitus without complication ([place])    TYPE 2   DX  4-5 YRS AGO  . ETD (Eustachian tube dysfunction), bilateral    left worse than right  . Fibroids    uterine  .  GAD (generalized anxiety disorder)   . GERD (gastroesophageal reflux disease)    TAKES PRESCRIPTION MEDS  . Glaucoma    BOTH EYES  . Hematoma    Hematoma of Left Hand- had to wear a split  . Hyperlipidemia   . Hypertension   . Hypothyroidism    NODULES ON THYROID  . PONV (postoperative nausea and vomiting)   . Pulmonary embolism ([place])   . Tachycardia   . Trigger finger   . Trigger finger of left thumb     Past Surgical History:  Procedure Laterality Date  . ANTERIOR CERVICAL DECOMP/DISCECTOMY FUSION N/A 11/13/2016   Procedure: Cervical five-six, Cervical six-seven Anterior Cervical Discectomy and Fusion, Allograft, Plate;  Surgeon: Marybelle Killings, MD;  Location: [facility];  Service: Orthopedics;  Laterality: N/A;  . BREAST SURGERY    . CARPAL TUNNEL RELEASE    . DILATION AND CURETTAGE OF UTERUS    . EXPLORATORY LAPAROTOMY    . NASAL SINUS SURGERY  07/22/13   Dr. Redmond Pulling in [place]    Review of systems negative except as noted in HPI / PMHx or noted below:  Review of Systems  Constitutional: Negative.   HENT: Negative.   Eyes: Negative.   Respiratory: Negative.   Cardiovascular: Negative.   Gastrointestinal: Negative.   Genitourinary: Negative.   Musculoskeletal: Negative.   Skin: Negative.   Neurological: Negative.   Endo/Heme/Allergies: Negative.   Psychiatric/Behavioral: Negative.      Objective:   Vitals:   08/05/17 0914  BP: 118/70  Pulse: (!) 103  Resp: 18  SpO2: 98%          Physical Exam  Constitutional: She is well-developed, well-nourished, and in no distress.  HENT:  Head: Normocephalic.  Right Ear: Tympanic membrane, external ear and ear canal normal.  Left Ear: Tympanic membrane, external ear and ear canal normal.  Nose: Nose normal. No mucosal edema or rhinorrhea.  Mouth/Throat: Uvula is midline, oropharynx is clear and moist and mucous membranes are normal. No oropharyngeal exudate.  Eyes: Conjunctivae are normal.  Neck: Trachea normal. No  tracheal tenderness present. No tracheal deviation present. No thyromegaly present.  1 cm mobile nontender submental nodule  Cardiovascular: Normal rate, regular rhythm, S1 normal, S2 normal and normal heart sounds.   No murmur heard. Pulmonary/Chest: Breath sounds normal. No stridor. No respiratory distress. She has no wheezes. She has no rales.  Musculoskeletal: She exhibits no edema.  Lymphadenopathy:       Head (right side): No tonsillar adenopathy present.       Head (left side): No tonsillar adenopathy present.    She has no cervical adenopathy.  Neurological: She is alert. Gait normal.  Skin: No rash noted. She is not diaphoretic. No erythema. Nails show no clubbing.  Psychiatric: Mood and affect normal.    Diagnostics:    Spirometry was performed and demonstrated an FEV1 of 1.72 at 82 % of predicted.  The patient had an Asthma Control Test with the following results: ACT Total Score: 19.    Results of a soft tissue head and neck ultrasound identified the following:   Tiny subcentimeter scattered thyroid cysts. No significant thyroid abnormality that warrants biopsy or follow-up  Nonspecific prominent left cervical lymph nodes correlate with the palpable abnormality on exam.  Assessment and Plan:   1. Asthma, severe persistent, well-controlled   2. Other allergic rhinitis   3. Headache disorder   4. LPRD (laryngopharyngeal reflux disease)   5. Thyroglossal duct cyst     1. Continue Immunotherapy and Epi-Pen / Auvi-Q  2. Continue to Treat and prevent inflammation:   A. Symbicort 160 - 1-2 inhalations twice a day  B. Flonase - one spray each nostril twice a day  C. montelukast - one tablet one time per day  D. Nucala injections every 4 weeks  3. Continue to Treat and prevent reflux:   A. pantoprazole 40 mg in a.m.  4. Discontinue cyproheptadine as this did not help Headaches  5. If needed:   A. Patanase 2 sprays each nostril two times per day  B. OTC  antihistamine  C. Proventil HFA or similar 2 inhalations every 4-6 hours  6. Visit with ENT to address 'specks, ears, thyroglossal duct cyst, thyroid nodules, headaches.   7.  Continue eye therapy with Dr. Delman Cheadle.  8. Return in 12 weeks or earlier if problem  9. Obtain fall flu vaccine   Jennifer Mullins will be meeting with a ear nose and throat doctor in the near future and hopefully he can help with the "specks" that appear to show up in her postnasal drip and the issue with her ETD and her thyroglossal duct cyst as well as documentation of thyroid nodules and headache. I doubt that these are all tied up together but they do appear to be significant issues that are bothering Jennifer Mullins and probably require some time and energy to put her mind at ease and to devise a plan that helps modify some of these issues. I have stopped her cyproheptadine as this does not appear to be helping her chronic daily headaches although it did appear to help her migraines but as noted above she may not require cyproheptadine for Migraine control at this point as she has modified her caffeine and chocolate consumption significantly. She will continue to use anti-inflammatory medications including her nucala as noted above to address her respiratory tract disease and will also continue on immunotherapy to address this condition.I will see her back in this clinic in 12 weeks or earlier if there is a problem.  Allena Katz, MD Allergy / Immunology [place]

## 2017-08-05 NOTE — Patient Instructions (Addendum)
  1. Continue Immunotherapy and Epi-Pen / Auvi-Q  2. Continue to Treat and prevent inflammation:   A. Symbicort 160 - 1-2 inhalations twice a day  B. Flonase - one spray each nostril twice a day  C. montelukast - one tablet one time per day  D. Nucala injections every 4 weeks  3. Continue to Treat and prevent reflux:   A. pantoprazole 40 mg in a.m.  4. Discontinue cyproheptadine as this did not help Headaches  5. If needed:   A. Patanase 2 sprays each nostril two times per day  B. OTC antihistamine  C. Proventil HFA or similar 2 inhalations every 4-6 hours  6. Visit with ENT to address 'specks, ears, thyroglossal duct cyst, thyroid nodules, headaches.   7. Continue eye therapy with Dr. Delman Cheadle.  8. Return in 12 weeks or earlier if problem  9. Obtain fall flu vaccine

## 2017-08-06 ENCOUNTER — Other Ambulatory Visit (INDEPENDENT_AMBULATORY_CARE_PROVIDER_SITE_OTHER): Payer: Self-pay | Admitting: Otolaryngology

## 2017-08-06 DIAGNOSIS — J329 Chronic sinusitis, unspecified: Secondary | ICD-10-CM

## 2017-08-06 DIAGNOSIS — Q892 Congenital malformations of other endocrine glands: Secondary | ICD-10-CM

## 2017-08-07 ENCOUNTER — Ambulatory Visit
Admission: RE | Admit: 2017-08-07 | Discharge: 2017-08-07 | Disposition: A | Discharge status: Home / Self Care | Payer: BLUE CROSS/BLUE SHIELD | Source: Ambulatory Visit | Attending: Otolaryngology | Admitting: Otolaryngology

## 2017-08-07 DIAGNOSIS — J019 Acute sinusitis, unspecified: Secondary | ICD-10-CM | POA: Diagnosis not present

## 2017-08-07 DIAGNOSIS — J329 Chronic sinusitis, unspecified: Secondary | ICD-10-CM

## 2017-08-07 DIAGNOSIS — Q892 Congenital malformations of other endocrine glands: Secondary | ICD-10-CM

## 2017-08-07 MED ORDER — IOPAMIDOL (ISOVUE-300) INJECTION 61%
75.0000 mL | Freq: Once | INTRAVENOUS | Status: AC | PRN
  Administered 2017-08-07: 75 mL via INTRAVENOUS

## 2017-08-08 ENCOUNTER — Ambulatory Visit (INDEPENDENT_AMBULATORY_CARE_PROVIDER_SITE_OTHER): Payer: BLUE CROSS/BLUE SHIELD

## 2017-08-08 DIAGNOSIS — J309 Allergic rhinitis, unspecified: Secondary | ICD-10-CM

## 2017-08-12 ENCOUNTER — Ambulatory Visit (INDEPENDENT_AMBULATORY_CARE_PROVIDER_SITE_OTHER): Payer: BLUE CROSS/BLUE SHIELD | Admitting: *Deleted

## 2017-08-12 ENCOUNTER — Telehealth: Payer: Self-pay | Admitting: Pediatrics

## 2017-08-12 DIAGNOSIS — J309 Allergic rhinitis, unspecified: Secondary | ICD-10-CM

## 2017-08-12 MED ORDER — PRAVASTATIN SODIUM 20 MG PO TABS: 20.0000 mg | ORAL_TABLET | Freq: Every day | ORAL | 0 refills | Status: DC

## 2017-08-12 NOTE — Telephone Encounter (Signed)
Patient aware that she is to take pravastatin 20mg  daily.  Patient also states that she had a CT done by Dr. Melene Plan.

## 2017-08-15 DIAGNOSIS — H1013 Acute atopic conjunctivitis, bilateral: Secondary | ICD-10-CM | POA: Diagnosis not present

## 2017-08-19 ENCOUNTER — Ambulatory Visit (INDEPENDENT_AMBULATORY_CARE_PROVIDER_SITE_OTHER): Payer: BLUE CROSS/BLUE SHIELD | Admitting: *Deleted

## 2017-08-19 DIAGNOSIS — J309 Allergic rhinitis, unspecified: Secondary | ICD-10-CM

## 2017-08-20 ENCOUNTER — Telehealth: Payer: Self-pay | Admitting: Pediatrics

## 2017-08-20 NOTE — Telephone Encounter (Signed)
Pt wanted a AM appt before 08/28/2017 No AM appts available  Pt kept appt scheduled

## 2017-08-27 ENCOUNTER — Ambulatory Visit (INDEPENDENT_AMBULATORY_CARE_PROVIDER_SITE_OTHER): Payer: BLUE CROSS/BLUE SHIELD | Admitting: *Deleted

## 2017-08-27 DIAGNOSIS — J309 Allergic rhinitis, unspecified: Secondary | ICD-10-CM

## 2017-08-28 ENCOUNTER — Ambulatory Visit: Payer: BLUE CROSS/BLUE SHIELD | Admitting: Pediatrics

## 2017-09-01 DIAGNOSIS — Z5181 Encounter for therapeutic drug level monitoring: Secondary | ICD-10-CM | POA: Diagnosis not present

## 2017-09-01 DIAGNOSIS — M797 Fibromyalgia: Secondary | ICD-10-CM | POA: Diagnosis not present

## 2017-09-01 DIAGNOSIS — J4550 Severe persistent asthma, uncomplicated: Secondary | ICD-10-CM | POA: Diagnosis not present

## 2017-09-01 DIAGNOSIS — Z79899 Other long term (current) drug therapy: Secondary | ICD-10-CM | POA: Diagnosis not present

## 2017-09-02 ENCOUNTER — Ambulatory Visit (INDEPENDENT_AMBULATORY_CARE_PROVIDER_SITE_OTHER): Payer: BLUE CROSS/BLUE SHIELD | Admitting: Podiatry

## 2017-09-02 ENCOUNTER — Ambulatory Visit: Payer: Self-pay

## 2017-09-02 ENCOUNTER — Ambulatory Visit (INDEPENDENT_AMBULATORY_CARE_PROVIDER_SITE_OTHER): Payer: BLUE CROSS/BLUE SHIELD

## 2017-09-02 ENCOUNTER — Ambulatory Visit (INDEPENDENT_AMBULATORY_CARE_PROVIDER_SITE_OTHER): Payer: BLUE CROSS/BLUE SHIELD | Admitting: *Deleted

## 2017-09-02 DIAGNOSIS — M779 Enthesopathy, unspecified: Secondary | ICD-10-CM

## 2017-09-02 DIAGNOSIS — J310 Chronic rhinitis: Secondary | ICD-10-CM | POA: Diagnosis not present

## 2017-09-02 DIAGNOSIS — R590 Localized enlarged lymph nodes: Secondary | ICD-10-CM | POA: Diagnosis not present

## 2017-09-02 DIAGNOSIS — J4550 Severe persistent asthma, uncomplicated: Secondary | ICD-10-CM | POA: Diagnosis not present

## 2017-09-02 DIAGNOSIS — M722 Plantar fascial fibromatosis: Secondary | ICD-10-CM

## 2017-09-02 DIAGNOSIS — J343 Hypertrophy of nasal turbinates: Secondary | ICD-10-CM | POA: Diagnosis not present

## 2017-09-02 DIAGNOSIS — R070 Pain in throat: Secondary | ICD-10-CM | POA: Diagnosis not present

## 2017-09-02 NOTE — Progress Notes (Signed)
She presents today chief complaint of pain to the bilateral heels. States that she is medication for her allergies and asthma every 4 weeks. She states that she's have an injection today and was concerned that it may interfere with steroid injections. She states that she is having bilateral foot and heel pain has a history of heel spurs and the right is particularly sore. She has a history of plantar fasciitis.  Objective: Vital signs are stable she is alert and oriented 3 pulses are palpable. Pain on palpation medial calcaneal tubercles bilateral. Radiographs reviewed today demonstrate plantar distally oriented calcaneal heel spur soft tissue increase in density at the plantar fascia calcaneal insertion site. This is consistent with plantar fasciitis. No signs of fracture. Mild pes planus is noted. Cutaneous evaluation does not restrict any type of open lesions or wounds.  Assessment: Plantar fasciitis bilateral.  Plan: Discussed etiology pathology conservative versus surgical therapies. At this point we injected the bilateral heels today with 20 mg Kenalog and local anesthetic each. I will follow-up with her in 4-6 weeks if necessary.

## 2017-09-03 DIAGNOSIS — Z79899 Other long term (current) drug therapy: Secondary | ICD-10-CM | POA: Diagnosis not present

## 2017-09-03 DIAGNOSIS — M797 Fibromyalgia: Secondary | ICD-10-CM | POA: Diagnosis not present

## 2017-09-03 DIAGNOSIS — Z5181 Encounter for therapeutic drug level monitoring: Secondary | ICD-10-CM | POA: Diagnosis not present

## 2017-09-04 ENCOUNTER — Other Ambulatory Visit: Payer: Self-pay | Admitting: Pediatrics

## 2017-09-04 ENCOUNTER — Ambulatory Visit (INDEPENDENT_AMBULATORY_CARE_PROVIDER_SITE_OTHER): Payer: BLUE CROSS/BLUE SHIELD | Admitting: *Deleted

## 2017-09-04 DIAGNOSIS — F411 Generalized anxiety disorder: Secondary | ICD-10-CM

## 2017-09-04 DIAGNOSIS — J309 Allergic rhinitis, unspecified: Secondary | ICD-10-CM | POA: Diagnosis not present

## 2017-09-05 NOTE — Telephone Encounter (Signed)
Last Rx written to be filled 06/19/2017 for #90 tabs with 2 refills, should have enough until appt

## 2017-09-07 ENCOUNTER — Other Ambulatory Visit: Payer: Self-pay | Admitting: Pediatrics

## 2017-09-07 DIAGNOSIS — E282 Polycystic ovarian syndrome: Secondary | ICD-10-CM

## 2017-09-08 ENCOUNTER — Encounter: Payer: Self-pay | Admitting: Hematology

## 2017-09-08 ENCOUNTER — Other Ambulatory Visit: Payer: Self-pay | Admitting: Hematology

## 2017-09-08 ENCOUNTER — Ambulatory Visit (HOSPITAL_BASED_OUTPATIENT_CLINIC_OR_DEPARTMENT_OTHER): Payer: BLUE CROSS/BLUE SHIELD | Admitting: Hematology

## 2017-09-08 ENCOUNTER — Other Ambulatory Visit (HOSPITAL_BASED_OUTPATIENT_CLINIC_OR_DEPARTMENT_OTHER): Payer: BLUE CROSS/BLUE SHIELD

## 2017-09-08 VITALS — BP 144/84 | HR 77 | Temp 98.6°F | Resp 18 | Ht 62.0 in | Wt 151.7 lb

## 2017-09-08 DIAGNOSIS — D509 Iron deficiency anemia, unspecified: Secondary | ICD-10-CM

## 2017-09-08 DIAGNOSIS — D6859 Other primary thrombophilia: Secondary | ICD-10-CM

## 2017-09-08 DIAGNOSIS — E876 Hypokalemia: Secondary | ICD-10-CM

## 2017-09-08 DIAGNOSIS — D500 Iron deficiency anemia secondary to blood loss (chronic): Secondary | ICD-10-CM

## 2017-09-08 LAB — FERRITIN: Ferritin: 230 ng/ml (ref 9–269)

## 2017-09-08 LAB — CBC & DIFF AND RETIC
BASO%: 0.2 % (ref 0.0–2.0)
Basophils Absolute: 0 10*3/uL (ref 0.0–0.1)
EOS%: 0.2 % (ref 0.0–7.0)
Eosinophils Absolute: 0 10*3/uL (ref 0.0–0.5)
HCT: 39.1 % (ref 34.8–46.6)
HGB: 13 g/dL (ref 11.6–15.9)
Immature Retic Fract: 12.3 % — ABNORMAL HIGH (ref 1.60–10.00)
LYMPH%: 32 % (ref 14.0–49.7)
MCH: 29.7 pg (ref 25.1–34.0)
MCHC: 33.2 g/dL (ref 31.5–36.0)
MCV: 89.5 fL (ref 79.5–101.0)
MONO#: 0.3 10*3/uL (ref 0.1–0.9)
MONO%: 5.7 % (ref 0.0–14.0)
NEUT#: 3.6 10*3/uL (ref 1.5–6.5)
NEUT%: 61.9 % (ref 38.4–76.8)
Platelets: 230 10*3/uL (ref 145–400)
RBC: 4.37 10*6/uL (ref 3.70–5.45)
RDW: 23.8 % — ABNORMAL HIGH (ref 11.2–14.5)
Retic %: 1.68 % (ref 0.70–2.10)
Retic Ct Abs: 73.42 10*3/uL (ref 33.70–90.70)
WBC: 5.8 10*3/uL (ref 3.9–10.3)
lymph#: 1.9 10*3/uL (ref 0.9–3.3)

## 2017-09-08 LAB — COMPREHENSIVE METABOLIC PANEL
ALT: 15 U/L (ref 0–55)
AST: 14 U/L (ref 5–34)
Albumin: 4.1 g/dL (ref 3.5–5.0)
Alkaline Phosphatase: 93 U/L (ref 40–150)
Anion Gap: 10 mEq/L (ref 3–11)
BUN: 5.9 mg/dL — ABNORMAL LOW (ref 7.0–26.0)
CO2: 25 mEq/L (ref 22–29)
Calcium: 8.8 mg/dL (ref 8.4–10.4)
Chloride: 105 mEq/L (ref 98–109)
Creatinine: 0.7 mg/dL (ref 0.6–1.1)
EGFR: >60 mL/min/{1.73_m2} (ref >60)
Glucose: 79 mg/dl (ref 70–140)
Potassium: 3 mEq/L — CL (ref 3.5–5.1)
Sodium: 139 mEq/L (ref 136–145)
Total Bilirubin: 0.46 mg/dL (ref 0.20–1.20)
Total Protein: 7.1 g/dL (ref 6.4–8.3)

## 2017-09-08 LAB — IRON AND TIBC
%SAT: 37 % (ref 21–57)
Iron: 107 ug/dL (ref 41–142)
TIBC: 293 ug/dL (ref 236–444)
UIBC: 186 ug/dL (ref 120–384)

## 2017-09-08 MED ORDER — POLYSACCHARIDE IRON COMPLEX 150 MG PO CAPS: 150.0000 mg | ORAL_CAPSULE | Freq: Every day | ORAL | 2 refills | Status: DC

## 2017-09-08 MED ORDER — POTASSIUM CHLORIDE CRYS ER 20 MEQ PO TBCR: 20.0000 meq | EXTENDED_RELEASE_TABLET | Freq: Every day | ORAL | 0 refills | Status: DC

## 2017-09-08 NOTE — Patient Instructions (Signed)
Thank you for choosing [facility] to provide your oncology and hematology care.  To afford each patient quality time with our providers, please arrive 30 minutes before your scheduled appointment time.  If you arrive late for your appointment, you may be asked to reschedule.  We strive to give you quality time with our providers, and arriving late affects you and other patients whose appointments are after yours.  If you are a no show for multiple scheduled visits, you may be dismissed from the clinic at the providers discretion.   Again, thank you for choosing [facility], our hope is that these requests will decrease the amount of time that you wait before being seen by our physicians.  ______________________________________________________________________ Should you have questions after your visit to the [facility], please contact our office at [phone] between the hours of 8:30 and 4:30 p.m.    Voicemails left after 4:30p.m will not be returned until the following business day.   For prescription refill requests, please have your pharmacy contact us directly.  Please also try to allow 48 hours for prescription requests.   Please contact the scheduling department for questions regarding scheduling.  For scheduling of procedures such as PET scans, CT scans, MRI, Ultrasound, etc please contact central scheduling at [phone].   Resources For Cancer Patients and Caregivers:  American Cancer Society:  800-227-2345  Can help patients locate various types of support and financial assistance Cancer Care: 1-800-813-HOPE (4673) Provides financial assistance, online support groups, medication/co-pay assistance.   [place] DSS:  336-641-3447 Where to apply for food stamps, Medicaid, and utility assistance Medicare Rights Center: 800-333-4114 Helps people with Medicare understand their rights and benefits, navigate the Medicare system, and secure the  quality healthcare they deserve SCAT: 336-333-6589 [place] [employer]'s shared-ride transportation service for eligible riders who have a disability that prevents them from riding the fixed route bus.   For additional information on assistance programs please contact our social worker:   Grier Hock/Abigail Elmore:  [phone] 

## 2017-09-08 NOTE — Progress Notes (Signed)
HEMATOLOGY ONCOLOGY PROGRESS NOTE  Date of service: 09/08/17  Patient Care Team: Eustaquio Maize, MD as PCP - General (Pediatrics)  Diagnosis:  h/o PE anemia  Current Treatment: IV iron as needed  SUMMARY OF ONCOLOGIC HISTORY:  No history exists.    INTERVAL HISTORY: Jennifer Mullins is a wonderful 46 y.o. female who has been referred to [place] by Dr .Evette Doffing, [place], MD  for evaluation and management of Pulmonary embolism and Anemia. Her anemia has resolved at this time 09/08/2017 with hgb at 13. She reports that she is doing well overall and is tolerated her IV iron very well. She states that her ice cravings have decreased. She reports that her menstrual cycle has been better after her endometrial ablation.  On review of systems, pt reports aches in her legs, HA caused by TMJ, and denies . Note on anticoagulation other than baby ASA at this time.   REVIEW OF SYSTEMS:    10 Point review of systems of done and is negative except as noted above.  . Past Medical History:  Diagnosis Date  . Anemia   . Anemia   . Anxiety   . Arthritis   . Asthma   . Bilateral calcaneal spurs   . Bilateral polycystic ovarian syndrome   . Bulging lumbar disc   . Carpal tunnel syndrome, bilateral   . Conjunctivitis   . COPD (chronic obstructive pulmonary disease) (HCC)    BRONCHITIS  . Diabetes mellitus without complication ([place])    TYPE 2   DX  4-5 YRS AGO  . ETD (Eustachian tube dysfunction), bilateral    left worse than right  . Fibroids    uterine  . GAD (generalized anxiety disorder)   . GERD (gastroesophageal reflux disease)    TAKES PRESCRIPTION MEDS  . Glaucoma    BOTH EYES  . Hematoma    Hematoma of Left Hand- had to wear a split  . Hyperlipidemia   . Hypertension   . Hypothyroidism    NODULES ON THYROID  . PONV (postoperative nausea and vomiting)   . Pulmonary embolism ([place])   . Tachycardia   . Trigger finger   . Trigger finger of left thumb     . Past Surgical  History:  Procedure Laterality Date  . ANTERIOR CERVICAL DECOMP/DISCECTOMY FUSION N/A 11/13/2016   Procedure: Cervical five-six, Cervical six-seven Anterior Cervical Discectomy and Fusion, Allograft, Plate;  Surgeon: Marybelle Killings, MD;  Location: [facility];  Service: Orthopedics;  Laterality: N/A;  . BREAST SURGERY    . CARPAL TUNNEL RELEASE    . DILATION AND CURETTAGE OF UTERUS    . EXPLORATORY LAPAROTOMY    . NASAL SINUS SURGERY  07/22/13   Dr. Redmond Pulling in [place]    . Social History  Substance Use Topics  . Smoking status: Former Smoker    Packs/day: 0.25    Years: 5.00    Types: Cigarettes    Quit date: 11/18/1989  . Smokeless tobacco: Never Used  . Alcohol use No    ALLERGIES:  is allergic to demerol [meperidine]; morphine and related; diltiazem hcl; lisinopril; and tramadol.  MEDICATIONS:  Current Outpatient Prescriptions  Medication Sig Dispense Refill  . DULoxetine (CYMBALTA) 20 MG capsule Take 20 mg by mouth daily.    Marland Kitchen acetaminophen-codeine (TYLENOL #3) 300-30 MG tablet Take 1-2 tablets by mouth every 6 (six) hours as needed for moderate pain.    Marland Kitchen albuterol (PROVENTIL) (2.5 MG/3ML) 0.083% nebulizer solution Take 2.5 mg by nebulization  every 6 (six) hours as needed for wheezing or shortness of breath.    . ALPRAZolam (XANAX) 0.5 MG tablet Take 1 tablet (0.5 mg total) by mouth 3 (three) times daily as needed for anxiety. 90 tablet 2  . aspirin 81 MG chewable tablet Chew 81 mg by mouth daily.    Marland Kitchen AUVI-Q 0.3 MG/0.3ML SOAJ injection Use as directed for life-threatening allergic reaction. 2 Device 3  . budesonide-formoterol (SYMBICORT) 160-4.5 MCG/ACT inhaler Inhale 1-2 puffs into the lungs 2 (two) times daily.    . cromolyn (OPTICROM) 4 % ophthalmic solution Place 1 drop into both eyes 4 (four) times daily.   1  . diclofenac (VOLTAREN) 0.1 % ophthalmic solution Place 1 drop into both eyes 3 (three) times daily.   0  . diclofenac sodium (VOLTAREN) 1 % GEL Apply 2 g topically 4 (four)  times daily. 100 g 0  . fluticasone (FLONASE) 50 MCG/ACT nasal spray Place 1 spray into both nostrils 2 (two) times daily. 16 g 5  . furosemide (LASIX) 20 MG tablet Take 1 tablet (20 mg total) by mouth daily. (Patient taking differently: Take 20 mg by mouth daily as needed for fluid. ) 30 tablet 5  . iron polysaccharides (NIFEREX) 150 MG capsule Take 1 capsule (150 mg total) by mouth daily. 60 capsule 2  . Lifitegrast (XIIDRA) 5 % SOLN Apply to eye 2 (two) times daily.     Marland Kitchen LINZESS 145 MCG CAPS capsule TAKE ONE CAPSULE BY MOUTH EVERY DAY BEFORE BREAKFAST 30 capsule 1  . losartan (COZAAR) 25 MG tablet Take 1 tablet (25 mg total) by mouth daily. (Patient taking differently: Take 12.5-25 mg by mouth daily. ) 30 tablet 3  . Mepolizumab (NUCALA [place]) Inject into the skin every 28 (twenty-eight) days.    . metFORMIN (GLUCOPHAGE) 500 MG tablet Take 1 tablet (500 mg total) by mouth daily with breakfast. 30 tablet 2  . montelukast (SINGULAIR) 10 MG tablet Take 1 tablet (10 mg total) by mouth at bedtime. 30 tablet 5  . Olopatadine HCl 0.6 % SOLN 2 sprays each nostril two times per day. 1 Bottle 5  . pantoprazole (PROTONIX) 40 MG tablet TAKE 1 TABLET BY MOUTH EVERY DAY 30 tablet 5  . pravastatin (PRAVACHOL) 20 MG tablet Take 1 tablet (20 mg total) by mouth daily. 90 tablet 0  . PROAIR HFA 108 (90 Base) MCG/ACT inhaler INHALE 1 TO 2 PUFFS EVERY FOUR HOURS AS NEEDED WHEEZING SHORTNESS OF BREATH 9 g 2   Current Facility-Administered Medications  Medication Dose Route Frequency Provider Last Rate Last Dose  . Mepolizumab SOLR 100 mg  100 mg Subcutaneous Q28 days Jiles Prows, MD   100 mg at 09/02/17 0959    PHYSICAL EXAMINATION: ECOG PERFORMANCE STATUS: 0 - Asymptomatic  . Vitals:   09/08/17 1003  BP: (!) 144/84  Pulse: 77  Resp: 18  Temp: 98.6 F (37 C)  SpO2: 100%    Filed Weights   09/08/17 1003  Weight: 151 lb 11.2 oz (68.8 kg)   .Body mass index is 27.75 kg/m.  GENERAL:alert, in no  acute distress and comfortable SKIN: no acute rashes, no significant lesions EYES: conjunctiva are pink and non-injected, sclera anicteric OROPHARYNX: [name], no exudates, no oropharyngeal erythema or ulceration NECK: supple, no JVD LYMPH:  1/2 cm lymph node in submental area. no palpable lymphadenopathy in the cervical, axillary or inguinal regions LUNGS: clear to auscultation b/l with normal respiratory effort HEART: regular rate & rhythm ABDOMEN:  normoactive bowel sounds , non tender, not distended. Extremity: no pedal edema PSYCH: alert & oriented x 3 with fluent speech NEURO: no focal motor/sensory deficits  LABORATORY DATA:   I have reviewed the data as listed  . CBC Latest Ref Rng & Units 09/08/2017 08/01/2017 07/08/2017  WBC 3.9 - 10.3 10e3/uL 5.8 4.7 6.0  Hemoglobin 11.6 - 15.9 g/dL 13.0 11.7 9.1(L)  Hematocrit 34.8 - 46.6 % 39.1 36.6 29.5(L)  Platelets 145 - 400 10e3/uL 230 226 255    . CMP Latest Ref Rng & Units 09/08/2017 07/08/2017 07/07/2017  Glucose 70 - 140 mg/dl 79 99 86  BUN 7.0 - 26.0 mg/dL 5.9(L) 8 7.4  Creatinine 0.6 - 1.1 mg/dL 0.7 0.60 0.8  Sodium 136 - 145 mEq/L 139 137 138  Potassium 3.5 - 5.1 mEq/L 3.0(LL) 3.9 3.6  Chloride 101 - 111 mmol/L - 108 -  CO2 22 - 29 mEq/L 25 21(L) 25  Calcium 8.4 - 10.4 mg/dL 8.8 8.5(L) 8.6  Total Protein 6.4 - 8.3 g/dL 7.1 7.0 7.0  Total Bilirubin 0.20 - 1.20 mg/dL 0.46 0.2(L) <0.22  Alkaline Phos 40 - 150 U/L 93 74 86  AST 5 - 34 U/L 14 18 17   ALT 0 - 55 U/L 15 14 15    . Lab Results  Component Value Date   IRON 107 09/08/2017   TIBC 293 09/08/2017   IRONPCTSAT 37 09/08/2017   (Iron and TIBC)  Lab Results  Component Value Date   FERRITIN 230 09/08/2017     RADIOGRAPHIC STUDIES: I have personally reviewed the radiological images as listed and agreed with the findings in the report.  ASSESSMENT & PLAN:  Jennifer Mullins is a wonderful 46 y.o. female who presents with anemia.  46 yo with   1) Acute Pulmonary  in 11/2016 - clearly provoked by cervical spinal surgery on 11/13/2017. No FHx of VTE and no previous personal hx of VTE. Patient has completed 6 months of anticoagulation. Plan -no indication for additional anticoagulation at this time. -reasonable to continue taking ASA 81mg  po daily for 6 months. -hypercoag workup not clearly recommended but done at request of patient - FVL neg , Prothrombin gene mutation neg and  Antiphospholipid ab - unrevealing.   2) Microcytic Anemia due to Iron def - now resolved. hgb 13 with ferritin of 230 3) Iron deficiency due to heavy menstrual losses- menstrual cycle improved and anemia resolved 09/08/2017 4) Constipation - does not tolerate PO iron due to GI issues.  Plan -anemia has resolved at this time. -no indication for additional IV Iron at this time. -Iron polysaccharide for maintain  -f/u with PCP in 4 months for repeat check of iron labs and CBC -f/u with PCP for potassium, level low at 3, discussed this was likely from lasix - given remporary prescription for Potassium replacement.  RTC with Dr Irene Limbo as needed   I spent 20 minutes counseling the patient face to face. The total time spent in the appointment was 25 minutes and more than 50% was on counseling and direct patient cares.    Sullivan Lone MD [place] AAHIVMS [facility] Hematology/Oncology Physician [facility]  (Office):       [phone] (Work cell):  [phone] (Fax):           [phone]   This document serves as a record of services personally performed by Sullivan Lone, MD. It was created on her behalf by Alean Rinne, a trained medical scribe. The creation of  this record is based on the scribe's personal observations and the provider's statements to them. This document has been checked and approved by the attending provider.

## 2017-09-11 ENCOUNTER — Ambulatory Visit (INDEPENDENT_AMBULATORY_CARE_PROVIDER_SITE_OTHER): Payer: BLUE CROSS/BLUE SHIELD | Admitting: *Deleted

## 2017-09-11 ENCOUNTER — Other Ambulatory Visit: Payer: Self-pay | Admitting: Pediatrics

## 2017-09-11 DIAGNOSIS — M542 Cervicalgia: Secondary | ICD-10-CM

## 2017-09-11 DIAGNOSIS — J309 Allergic rhinitis, unspecified: Secondary | ICD-10-CM | POA: Diagnosis not present

## 2017-09-11 NOTE — Telephone Encounter (Signed)
Has appt with me for refills 10/29

## 2017-09-13 ENCOUNTER — Other Ambulatory Visit: Payer: Self-pay | Admitting: Pediatrics

## 2017-09-13 DIAGNOSIS — F411 Generalized anxiety disorder: Secondary | ICD-10-CM

## 2017-09-15 ENCOUNTER — Encounter: Payer: Self-pay | Admitting: Pediatrics

## 2017-09-15 ENCOUNTER — Ambulatory Visit (INDEPENDENT_AMBULATORY_CARE_PROVIDER_SITE_OTHER): Payer: BLUE CROSS/BLUE SHIELD | Admitting: Pediatrics

## 2017-09-15 VITALS — BP 122/78 | HR 86 | Temp 98.8°F | Ht 62.0 in | Wt 152.4 lb

## 2017-09-15 DIAGNOSIS — K219 Gastro-esophageal reflux disease without esophagitis: Secondary | ICD-10-CM

## 2017-09-15 DIAGNOSIS — E876 Hypokalemia: Secondary | ICD-10-CM

## 2017-09-15 DIAGNOSIS — M542 Cervicalgia: Secondary | ICD-10-CM | POA: Diagnosis not present

## 2017-09-15 DIAGNOSIS — F411 Generalized anxiety disorder: Secondary | ICD-10-CM | POA: Diagnosis not present

## 2017-09-15 DIAGNOSIS — I10 Essential (primary) hypertension: Secondary | ICD-10-CM | POA: Diagnosis not present

## 2017-09-15 DIAGNOSIS — E785 Hyperlipidemia, unspecified: Secondary | ICD-10-CM | POA: Diagnosis not present

## 2017-09-15 DIAGNOSIS — K5900 Constipation, unspecified: Secondary | ICD-10-CM

## 2017-09-15 DIAGNOSIS — M797 Fibromyalgia: Secondary | ICD-10-CM | POA: Diagnosis not present

## 2017-09-15 DIAGNOSIS — R609 Edema, unspecified: Secondary | ICD-10-CM | POA: Diagnosis not present

## 2017-09-15 MED ORDER — ACETAMINOPHEN-CODEINE #3 300-30 MG PO TABS: 1.0000 | ORAL_TABLET | Freq: Four times a day (QID) | ORAL | 1 refills | Status: DC | PRN

## 2017-09-15 MED ORDER — LINACLOTIDE 290 MCG PO CAPS: ORAL_CAPSULE | ORAL | 1 refills | Status: DC

## 2017-09-15 MED ORDER — PANTOPRAZOLE SODIUM 40 MG PO TBEC: 40.0000 mg | DELAYED_RELEASE_TABLET | Freq: Every day | ORAL | 1 refills | Status: DC

## 2017-09-15 MED ORDER — FUROSEMIDE 20 MG PO TABS: 20.0000 mg | ORAL_TABLET | Freq: Every day | ORAL | 2 refills | Status: DC | PRN

## 2017-09-15 MED ORDER — PRAVASTATIN SODIUM 20 MG PO TABS: 20.0000 mg | ORAL_TABLET | Freq: Every day | ORAL | 1 refills | Status: DC

## 2017-09-15 MED ORDER — DICLOFENAC SODIUM 1 % TD GEL: 2.0000 g | Freq: Four times a day (QID) | TRANSDERMAL | 0 refills | Status: DC

## 2017-09-15 MED ORDER — ALPRAZOLAM 0.5 MG PO TABS: 0.5000 mg | ORAL_TABLET | Freq: Three times a day (TID) | ORAL | 2 refills | Status: DC | PRN

## 2017-09-15 MED ORDER — LOSARTAN POTASSIUM 25 MG PO TABS: 12.5000 mg | ORAL_TABLET | Freq: Every day | ORAL | 1 refills | Status: DC

## 2017-09-15 NOTE — Patient Instructions (Addendum)
[facility] [address] [place], [place] [address] Phone: [phone]   [place]  Look at food labels Stay with less than 30g of sugar a day  [facility] in [place] for hair removal

## 2017-09-15 NOTE — Progress Notes (Signed)
Subjective:   Patient ID: Jennifer Mullins, female    DOB: [birth year], 46 y.o.   MRN: [id number] CC: Follow-up (3 month) f/u multiple med problems HPI: Jennifer Mullins is a 46 y.o. 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 Doesn't think she grinds her teeth at night Chewing gum frequently  HTN: taking medication regularly No HA, no CP  LE edema: takes lasix for about a week around time of period, now taking potassium with lasix   Indication for chronic opioid: neck pain, back pain following surgeries Medication and dose: tylenol #3 # pills per month: #120, take 1-2 tabs q6h prn Last UDS date: 06/2017 Pain contract signed (Y/N): Y Date narcotic database last reviewed (include red flags): today, no red flags  Elevated BMI: Drinking sodas daily Limited exercise due to pain, open to becoming more active  Relevant past medical, surgical, family and social history reviewed. Allergies and medications reviewed and updated. History  Smoking Status  . Former Smoker  . Packs/day: 0.25  . Years: 5.00  . Types: Cigarettes  . Quit date: 11/18/1989  Smokeless Tobacco  . Never Used   ROS: Per HPI   Objective:    BP 122/78   Pulse 86   Temp 98.8 F (37.1 C) (Oral)   Ht '5\' 2"'  (1.575 m)   Wt 152 lb 6.4 oz (69.1 kg)   BMI 27.87 kg/m   Wt Readings from Last 3 Encounters:  09/15/17 152 lb 6.4 oz (69.1 kg)  09/08/17 151 lb 11.2 oz (68.8 kg)  08/01/17 153 lb 6.4 oz (69.6 kg)    Gen: NAD, alert, cooperative with exam, NCAT EYES: EOMI, no conjunctival injection, or no icterus CV: NRRR, normal S1/S2, no murmur Resp: CTABL, no wheezes, normal WOB Abd: +BS, soft,  NTND.  Ext: No edema, warm Neuro: Alert and oriented, using cane MSK: normal muscle bulk Skin: 2cm soft nodule ant L shin  Assessment & Plan:  Jennifer Mullins seen today for follow-up multiple med problems  Diagnoses and all orders for this visit:  Hyperlipidemia, unspecified hyperlipidemia type Stable, cont below -     pravastatin (PRAVACHOL) 20 MG tablet; Take 1 tablet (20 mg total) by mouth daily.  Neck pain Has been referred to pain clinic for pain management, has had initial appt 2 months of tylenol #3 prescribed, #120 tabs Taking regularly, does help keep pt active, performing ADLs Pt says the pain clinic needed referral specifically for pain medication management, referral in -     Ambulatory referral to Pain Clinic -     acetaminophen-codeine (TYLENOL #3) 300-30 MG tablet; Take 1-2 tablets by mouth every 6 (six) hours as needed for moderate pain.  GAD (generalized anxiety disorder) Stable, cont below, now on SNRI as well -     ALPRAZolam (XANAX) 0.5 MG tablet; Take 1 tablet (0.5 mg total) by mouth 3 (three) times daily as needed for anxiety.  Fibromyalgia Starting aqua therapy, following with pain management -     diclofenac sodium (VOLTAREN) 1 % GEL; Apply 2 g topically 4 (four) times daily.  Body fluid retention Taking during period, helps with bloating, now will take with potassium -  furosemide (LASIX) 20 MG tablet; Take 1 tablet (20 mg total) by mouth daily as needed for fluid.  Constipation, unspecified constipation type Stable, cont below -     linaclotide (LINZESS) 290 MCG CAPS capsule; TAKE ONE CAPSULE BY MOUTH EVERY DAY BEFORE BREAKFAST  Essential hypertension Stable, cont below -     losartan (COZAAR) 25 MG tablet; Take 0.5-1 tablets (12.5-25 mg total) by mouth daily.  Gastroesophageal reflux disease without esophagitis -     pantoprazole (PROTONIX) 40 MG tablet; Take 1 tablet (40 mg total) by mouth daily.  Hypokalemia Recheck in 3 weeks -      BMP8+EGFR; Future   Follow up plan: Return in about 3 months (around 12/16/2017). [name], MD [place]

## 2017-09-16 ENCOUNTER — Ambulatory Visit (INDEPENDENT_AMBULATORY_CARE_PROVIDER_SITE_OTHER): Payer: BLUE CROSS/BLUE SHIELD | Admitting: Allergy and Immunology

## 2017-09-16 ENCOUNTER — Telehealth: Payer: Self-pay | Admitting: Podiatry

## 2017-09-16 ENCOUNTER — Encounter: Payer: Self-pay | Admitting: Allergy and Immunology

## 2017-09-16 VITALS — BP 132/90 | HR 88

## 2017-09-16 DIAGNOSIS — K219 Gastro-esophageal reflux disease without esophagitis: Secondary | ICD-10-CM

## 2017-09-16 DIAGNOSIS — J4550 Severe persistent asthma, uncomplicated: Secondary | ICD-10-CM | POA: Diagnosis not present

## 2017-09-16 DIAGNOSIS — H1045 Other chronic allergic conjunctivitis: Secondary | ICD-10-CM | POA: Diagnosis not present

## 2017-09-16 DIAGNOSIS — R51 Headache: Secondary | ICD-10-CM | POA: Diagnosis not present

## 2017-09-16 DIAGNOSIS — R519 Headache, unspecified: Secondary | ICD-10-CM

## 2017-09-16 DIAGNOSIS — J3089 Other allergic rhinitis: Secondary | ICD-10-CM | POA: Diagnosis not present

## 2017-09-16 DIAGNOSIS — H1013 Acute atopic conjunctivitis, bilateral: Secondary | ICD-10-CM

## 2017-09-16 NOTE — Patient Instructions (Signed)
  1. Continue Immunotherapy and Epi-Pen / Auvi-Q  2. Continue to Treat and prevent inflammation:   A. Symbicort 160 - 1-2 inhalations twice a day  B. Flonase - one spray each nostril twice a day  C. montelukast - one tablet one time per day  D. Nucala injections every 4 weeks  3. Continue to Treat and prevent reflux:   A. pantoprazole 40 mg in a.m.  4. If needed:   A. Patanase 2 sprays each nostril two times per day  B. OTC antihistamine  C. Proventil HFA or similar 2 inhalations every 4-6 hours  5. Continue eye therapy with Dr. Delman Cheadle.  6. Return in 12 weeks or earlier if problem

## 2017-09-16 NOTE — Telephone Encounter (Signed)
Pt called checking on status of orthotics.   spoke to [place] at [facility] and they are putting a rush on them and should be here in 5 to 7 days.  I have notified patient and will call her when they come in.

## 2017-09-16 NOTE — Progress Notes (Signed)
Follow-up Note  Referring Provider: Eustaquio Maize, MD Primary Provider: Eustaquio Maize, MD Date of Office Visit: 09/16/2017  Subjective:   Jennifer Mullins (DOB: 21-Feb-1971) is a 46 y.o. female who returns to the [facility] on 09/16/2017 in re-evaluation of the following:  HPI: Jimmy returns to this clinic in reevaluation of her asthma and allergic rhinitis and allergic conjunctivitis with possible component of vernal conjunctivitis and LPR and headache disorder among other issues. I last saw her in this clinic 08/05/2017.  Regarding her asthma, she is doing wonderful at this point in time and rarely uses a short acting bronchodilator. She continues on nucala injections and immunotherapy.  Concerning her upper airways, she has very little problem with nasal congestion or sneezing. Her eyes are doing very well at this point in time while using a combination of cromolyn and diclofenac and xiidra administered by Dr. Delman Cheadle.  She still continues to have headaches about twice a week. She is down to drinking a half of coffee in the morning. She did visit with Teoh regarding headaches and ear pain and he felt that some of her issue was related to TMJ. She still continues to have achiness of her joints and she has been diagnosed with fibromyalgia. She is received steroid injections in her popliteal fossa and heel spur recently.  She has no issues with reflux and no issues with her throat at this point in time.  Allergies as of 09/16/2017      Reactions   Demerol [meperidine] Shortness Of Breath   FLUSHING AND SHORTNESS OF BREATH   Morphine And Related Shortness Of Breath   Flushed and hot hyper   Diltiazem Hcl Hives   Lisinopril    Caused cough   Tramadol Other (See Comments)   Tears up stomach      Medication List      acetaminophen-codeine 300-30 MG tablet Commonly known as:  TYLENOL #3 Take 1-2 tablets by mouth every 6 (six) hours as needed for moderate  pain.   albuterol (2.5 MG/3ML) 0.083% nebulizer solution Commonly known as:  PROVENTIL Take 2.5 mg by nebulization every 6 (six) hours as needed for wheezing or shortness of breath.   PROAIR HFA 108 (90 Base) MCG/ACT inhaler Generic drug:  albuterol INHALE 1 TO 2 PUFFS EVERY FOUR HOURS AS NEEDED WHEEZING SHORTNESS OF BREATH   ALPRAZolam 0.5 MG tablet Commonly known as:  XANAX Take 1 tablet (0.5 mg total) by mouth 3 (three) times daily as needed for anxiety.   aspirin 81 MG chewable tablet Chew 81 mg by mouth daily.   AUVI-Q 0.3 mg/0.3 mL Soaj injection Generic drug:  EPINEPHrine Use as directed for life-threatening allergic reaction.   budesonide-formoterol 160-4.5 MCG/ACT inhaler Commonly known as:  SYMBICORT Inhale 1-2 puffs into the lungs 2 (two) times daily.   cromolyn 4 % ophthalmic solution Commonly known as:  OPTICROM Place 1 drop into both eyes 4 (four) times daily.   diclofenac 0.1 % ophthalmic solution Commonly known as:  VOLTAREN Place 1 drop into both eyes 3 (three) times daily.   diclofenac sodium 1 % Gel Commonly known as:  VOLTAREN Apply 2 g topically 4 (four) times daily.   DULoxetine 20 MG capsule Commonly known as:  CYMBALTA Take 20 mg by mouth daily.   fluticasone 50 MCG/ACT nasal spray Commonly known as:  FLONASE Place 1 spray into both nostrils 2 (two) times daily.   furosemide 20 MG tablet Commonly known as:  LASIX Take 1 tablet (20 mg total) by mouth daily as needed for fluid.   linaclotide 290 MCG Caps capsule Commonly known as:  LINZESS TAKE ONE CAPSULE BY MOUTH EVERY DAY BEFORE BREAKFAST   losartan 25 MG tablet Commonly known as:  COZAAR Take 0.5-1 tablets (12.5-25 mg total) by mouth daily.   metFORMIN 500 MG tablet Commonly known as:  GLUCOPHAGE TAKE ONE TABLET BY MOUTH DAILY WITH BREAKFAST   montelukast 10 MG tablet Commonly known as:  SINGULAIR Take 1 tablet (10 mg total) by mouth at bedtime.   NUCALA [place] Inject into the  skin every 28 (twenty-eight) days.   Olopatadine HCl 0.6 % Soln 2 sprays each nostril two times per day.   pantoprazole 40 MG tablet Commonly known as:  PROTONIX Take 1 tablet (40 mg total) by mouth daily.   potassium chloride SA 20 MEQ tablet Commonly known as:  K-DUR,KLOR-CON Take 1 tablet (20 mEq total) by mouth daily. Take 2 tabs daily for 3 days then 1 tab po daily while on lasix. labs with PCP in 3-4 weeks   pravastatin 20 MG tablet Commonly known as:  PRAVACHOL Take 1 tablet (20 mg total) by mouth daily.   XIIDRA 5 % Soln Generic drug:  Lifitegrast Apply to eye 2 (two) times daily.       Past Medical History:  Diagnosis Date  . Anemia   . Anemia   . Anxiety   . Arthritis   . Asthma   . Bilateral calcaneal spurs   . Bilateral polycystic ovarian syndrome   . Bulging lumbar disc   . Carpal tunnel syndrome, bilateral   . Conjunctivitis   . COPD (chronic obstructive pulmonary disease) (HCC)    BRONCHITIS  . Diabetes mellitus without complication ([place])    TYPE 2   DX  4-5 YRS AGO  . ETD (eustachian tube dysfunction)   . ETD (Eustachian tube dysfunction), bilateral    left worse than right  . Fibroids    uterine  . GAD (generalized anxiety disorder)   . GERD (gastroesophageal reflux disease)    TAKES PRESCRIPTION MEDS  . Glaucoma    BOTH EYES  . Hematoma    Hematoma of Left Hand- had to wear a split  . Hyperlipidemia   . Hypertension   . Hypothyroidism    NODULES ON THYROID  . PONV (postoperative nausea and vomiting)   . Pulmonary embolism ([place])   . Tachycardia   . TMJ (dislocation of temporomandibular joint)   . Trigger finger   . Trigger finger of left thumb     Past Surgical History:  Procedure Laterality Date  . ANTERIOR CERVICAL DECOMP/DISCECTOMY FUSION N/A 11/13/2016   Procedure: Cervical five-six, Cervical six-seven Anterior Cervical Discectomy and Fusion, Allograft, Plate;  Surgeon: Marybelle Killings, MD;  Location: [facility];  Service: Orthopedics;   Laterality: N/A;  . BREAST SURGERY    . CARPAL TUNNEL RELEASE    . DILATION AND CURETTAGE OF UTERUS    . EXPLORATORY LAPAROTOMY    . NASAL SINUS SURGERY  07/22/13   Dr. Redmond Pulling in [place]    Review of systems negative except as noted in HPI / PMHx or noted below:  Review of Systems  Constitutional: Negative.   HENT: Negative.   Eyes: Negative.   Respiratory: Negative.   Cardiovascular: Negative.   Gastrointestinal: Negative.   Genitourinary: Negative.   Musculoskeletal: Negative.   Skin: Negative.   Neurological: Negative.   Endo/Heme/Allergies: Negative.   Psychiatric/Behavioral: Negative.  Objective:   Vitals:   09/16/17 0858  BP: 132/90  Pulse: 88  SpO2: 98%          Physical Exam  Constitutional: She is well-developed, well-nourished, and in no distress.  HENT:  Head: Normocephalic.  Right Ear: Tympanic membrane, external ear and ear canal normal.  Left Ear: Tympanic membrane, external ear and ear canal normal.  Nose: Nose normal. No mucosal edema or rhinorrhea.  Mouth/Throat: Uvula is midline, oropharynx is clear and moist and mucous membranes are normal. No oropharyngeal exudate.  Eyes: Conjunctivae are normal.  Neck: Trachea normal. No tracheal tenderness present. No tracheal deviation present. No thyromegaly present.  Cardiovascular: Normal rate, regular rhythm, S1 normal, S2 normal and normal heart sounds.   No murmur heard. Pulmonary/Chest: Breath sounds normal. No stridor. No respiratory distress. She has no wheezes. She has no rales.  Musculoskeletal: She exhibits no edema.  Lymphadenopathy:       Head (right side): No tonsillar adenopathy present.       Head (left side): No tonsillar adenopathy present.    She has no cervical adenopathy.  Neurological: She is alert. Gait normal.  Skin: No rash noted. She is not diaphoretic. No erythema. Nails show no clubbing.  Psychiatric: Mood and affect normal.    Diagnostics:    Spirometry was performed  and demonstrated an FEV1 of 1.92 at 88 % of predicted.  The patient had an Asthma Control Test with the following results: ACT Total Score: 22.    Assessment and Plan:   1. Asthma, severe persistent, well-controlled   2. Other allergic rhinitis   3. Perennial allergic conjunctivitis of both eyes   4. Headache disorder   5. LPRD (laryngopharyngeal reflux disease)     1. Continue Immunotherapy and Epi-Pen / Auvi-Q  2. Continue to Treat and prevent inflammation:   A. Symbicort 160 - 1-2 inhalations twice a day  B. Flonase - one spray each nostril twice a day  C. montelukast - one tablet one time per day  D. Nucala injections every 4 weeks  3. Continue to Treat and prevent reflux:   A. pantoprazole 40 mg in a.m.  4. If needed:   A. Patanase 2 sprays each nostril two times per day  B. OTC antihistamine  C. Proventil HFA or similar 2 inhalations every 4-6 hours  5. Continue eye therapy with Dr. Delman Cheadle.  6. Return in 12 weeks or earlier if problem  Wende appears to be doing relatively well at this point in time on her current therapy which includes immunotherapy and mepolizumab injections as well as other anti-inflammatory agents for her respiratory tract and treatment directed against reflux. She will continue on this plan and I will see her back in this clinic in 12 weeks or earlier if there is a problem.  Allena Katz, MD Allergy / Immunology [place]

## 2017-09-17 NOTE — Addendum Note (Signed)
Addended by: Herbie Drape on: 09/17/2017 09:09 AM   Modules accepted: Orders

## 2017-09-18 ENCOUNTER — Telehealth: Payer: Self-pay | Admitting: Pediatrics

## 2017-09-18 DIAGNOSIS — M79602 Pain in left arm: Secondary | ICD-10-CM | POA: Diagnosis not present

## 2017-09-18 DIAGNOSIS — M79601 Pain in right arm: Secondary | ICD-10-CM | POA: Diagnosis not present

## 2017-09-18 DIAGNOSIS — M25511 Pain in right shoulder: Secondary | ICD-10-CM | POA: Diagnosis not present

## 2017-09-18 DIAGNOSIS — M797 Fibromyalgia: Secondary | ICD-10-CM | POA: Diagnosis not present

## 2017-09-18 DIAGNOSIS — M545 Low back pain: Secondary | ICD-10-CM | POA: Diagnosis not present

## 2017-09-18 DIAGNOSIS — M542 Cervicalgia: Secondary | ICD-10-CM | POA: Diagnosis not present

## 2017-09-18 NOTE — Telephone Encounter (Signed)
debbi spoke to patient

## 2017-09-22 ENCOUNTER — Other Ambulatory Visit: Payer: Self-pay | Admitting: Pediatrics

## 2017-09-22 DIAGNOSIS — M545 Low back pain: Secondary | ICD-10-CM | POA: Diagnosis not present

## 2017-09-22 DIAGNOSIS — M79602 Pain in left arm: Secondary | ICD-10-CM | POA: Diagnosis not present

## 2017-09-22 DIAGNOSIS — K5900 Constipation, unspecified: Secondary | ICD-10-CM

## 2017-09-22 DIAGNOSIS — M79601 Pain in right arm: Secondary | ICD-10-CM | POA: Diagnosis not present

## 2017-09-22 DIAGNOSIS — M542 Cervicalgia: Secondary | ICD-10-CM | POA: Diagnosis not present

## 2017-09-23 ENCOUNTER — Telehealth: Payer: Self-pay

## 2017-09-23 ENCOUNTER — Ambulatory Visit (INDEPENDENT_AMBULATORY_CARE_PROVIDER_SITE_OTHER): Payer: BLUE CROSS/BLUE SHIELD

## 2017-09-23 DIAGNOSIS — J4550 Severe persistent asthma, uncomplicated: Secondary | ICD-10-CM

## 2017-09-23 NOTE — Telephone Encounter (Signed)
Insurance denied Diclofenac gel 

## 2017-09-25 ENCOUNTER — Ambulatory Visit (INDEPENDENT_AMBULATORY_CARE_PROVIDER_SITE_OTHER): Payer: Self-pay

## 2017-09-25 ENCOUNTER — Ambulatory Visit (INDEPENDENT_AMBULATORY_CARE_PROVIDER_SITE_OTHER): Payer: BLUE CROSS/BLUE SHIELD | Admitting: Orthopaedic Surgery

## 2017-09-25 ENCOUNTER — Encounter (INDEPENDENT_AMBULATORY_CARE_PROVIDER_SITE_OTHER): Payer: Self-pay | Admitting: Orthopaedic Surgery

## 2017-09-25 VITALS — Ht 63.0 in | Wt 152.0 lb

## 2017-09-25 DIAGNOSIS — M25561 Pain in right knee: Secondary | ICD-10-CM | POA: Diagnosis not present

## 2017-09-25 DIAGNOSIS — M65311 Trigger thumb, right thumb: Secondary | ICD-10-CM

## 2017-09-25 DIAGNOSIS — M65312 Trigger thumb, left thumb: Secondary | ICD-10-CM | POA: Diagnosis not present

## 2017-09-25 DIAGNOSIS — M542 Cervicalgia: Secondary | ICD-10-CM

## 2017-09-26 ENCOUNTER — Telehealth: Payer: Self-pay | Admitting: Pediatrics

## 2017-09-26 ENCOUNTER — Encounter: Payer: Self-pay | Admitting: Pediatrics

## 2017-09-26 ENCOUNTER — Ambulatory Visit (INDEPENDENT_AMBULATORY_CARE_PROVIDER_SITE_OTHER): Payer: BLUE CROSS/BLUE SHIELD | Admitting: Pediatrics

## 2017-09-26 VITALS — BP 132/78 | HR 71 | Temp 98.0°F | Ht 63.0 in | Wt 152.2 lb

## 2017-09-26 DIAGNOSIS — M797 Fibromyalgia: Secondary | ICD-10-CM | POA: Diagnosis not present

## 2017-09-26 DIAGNOSIS — R52 Pain, unspecified: Secondary | ICD-10-CM

## 2017-09-26 DIAGNOSIS — M1711 Unilateral primary osteoarthritis, right knee: Secondary | ICD-10-CM | POA: Diagnosis not present

## 2017-09-26 DIAGNOSIS — L299 Pruritus, unspecified: Secondary | ICD-10-CM

## 2017-09-26 MED ORDER — TRIAMCINOLONE ACETONIDE 0.1 % EX CREA: 1.0000 "application " | TOPICAL_CREAM | Freq: Two times a day (BID) | CUTANEOUS | 0 refills | Status: DC

## 2017-09-26 NOTE — Telephone Encounter (Signed)
I sent in Rx for triamcinolone. Use on itching areas only. Do not use on face. Can cause hypopigmentation/light spots on skin. If needing for longer than 2 weeks, come back to be seen.

## 2017-09-26 NOTE — Telephone Encounter (Signed)
Patient aware.

## 2017-09-26 NOTE — Progress Notes (Signed)
  Subjective:   Patient ID: Jennifer Mullins, female    DOB: 1971/04/26, 46 y.o.   MRN: [id number] CC: Follow-up multiple med problems HPI: Jennifer Mullins is a 46 y.o. 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 Allergies and medications reviewed and updated. Social History   Tobacco Use  Smoking Status Former Smoker  . Packs/day: 0.25  . Years: 5.00  . Pack years: 1.25  . Types: Cigarettes  . Last attempt to quit: 11/18/1989  . Years since quitting: 27.8  Smokeless Tobacco Never Used   ROS: Per HPI   Objective:    BP 132/78   Pulse 71   Temp 98 F (36.7 C) (Oral)   Ht 5\' 3"  (1.6 m)   Wt 152 lb 3.2 oz (69 kg)   BMI 26.96 kg/m   Wt Readings from Last 3 Encounters:  09/26/17 152 lb 3.2 oz (69 kg)  09/25/17 152 lb (68.9 kg)  09/15/17 152 lb 6.4 oz (69.1 kg)    Gen: NAD, alert, cooperative with exam, NCAT EYES: EOMI, no conjunctival injection, or no icterus ENT:  OP without erythema CV: NRRR, normal S1/S2, no murmur, distal pulses 2+ b/l Resp: CTABL, no wheezes, normal WOB Abd: +BS, soft, NTND Ext: No edema, warm Neuro: Alert and oriented MSK: normal muscle bulk Injection  sites b/l thumbs without surrounding redness or induration  Assessment & Plan:  Taji was seen today for follow-up med problems  Diagnoses and all orders for this visit:  Fibromyalgia [facility] I have reviewed their records Recommending weaning off of tylenol #3 as it has not been helpful Discussed recommendations with pt She plans on continuing physical therapy, is hopeful that it will be helpful She is fatigued after PT sessions with more muscle soreness Discussed using naproxen to help at those times  Osteoarthritis of right knee, unspecified osteoarthritis type NSAIDs, rest, ice prn  Follow up plan: Return if symptoms worsen or fail to improve. [name], MD [place]

## 2017-09-29 NOTE — Telephone Encounter (Signed)
Pt requesting referral to [facility] per Dr Evette Doffing Referral entered in [facility]

## 2017-09-30 ENCOUNTER — Ambulatory Visit (INDEPENDENT_AMBULATORY_CARE_PROVIDER_SITE_OTHER): Payer: BLUE CROSS/BLUE SHIELD | Admitting: *Deleted

## 2017-09-30 DIAGNOSIS — J4550 Severe persistent asthma, uncomplicated: Secondary | ICD-10-CM

## 2017-09-30 DIAGNOSIS — Z1231 Encounter for screening mammogram for malignant neoplasm of breast: Secondary | ICD-10-CM | POA: Diagnosis not present

## 2017-10-01 ENCOUNTER — Telehealth: Payer: Self-pay | Admitting: Pediatrics

## 2017-10-01 ENCOUNTER — Other Ambulatory Visit: Payer: Self-pay | Admitting: Allergy and Immunology

## 2017-10-01 DIAGNOSIS — M545 Low back pain: Secondary | ICD-10-CM | POA: Diagnosis not present

## 2017-10-01 DIAGNOSIS — J4540 Moderate persistent asthma, uncomplicated: Secondary | ICD-10-CM

## 2017-10-01 DIAGNOSIS — M79601 Pain in right arm: Secondary | ICD-10-CM | POA: Diagnosis not present

## 2017-10-01 DIAGNOSIS — M79602 Pain in left arm: Secondary | ICD-10-CM | POA: Diagnosis not present

## 2017-10-01 DIAGNOSIS — M542 Cervicalgia: Secondary | ICD-10-CM | POA: Diagnosis not present

## 2017-10-01 NOTE — Telephone Encounter (Signed)
Pt notified referrals will fax over notes

## 2017-10-02 ENCOUNTER — Other Ambulatory Visit: Payer: Self-pay | Admitting: Pediatrics

## 2017-10-03 ENCOUNTER — Ambulatory Visit (INDEPENDENT_AMBULATORY_CARE_PROVIDER_SITE_OTHER): Payer: BLUE CROSS/BLUE SHIELD

## 2017-10-03 DIAGNOSIS — J3089 Other allergic rhinitis: Secondary | ICD-10-CM

## 2017-10-06 DIAGNOSIS — M79601 Pain in right arm: Secondary | ICD-10-CM | POA: Diagnosis not present

## 2017-10-06 DIAGNOSIS — M542 Cervicalgia: Secondary | ICD-10-CM | POA: Diagnosis not present

## 2017-10-06 DIAGNOSIS — M545 Low back pain: Secondary | ICD-10-CM | POA: Diagnosis not present

## 2017-10-06 DIAGNOSIS — M79602 Pain in left arm: Secondary | ICD-10-CM | POA: Diagnosis not present

## 2017-10-07 ENCOUNTER — Ambulatory Visit (INDEPENDENT_AMBULATORY_CARE_PROVIDER_SITE_OTHER): Payer: BLUE CROSS/BLUE SHIELD

## 2017-10-07 DIAGNOSIS — J309 Allergic rhinitis, unspecified: Secondary | ICD-10-CM

## 2017-10-10 DIAGNOSIS — M79602 Pain in left arm: Secondary | ICD-10-CM | POA: Diagnosis not present

## 2017-10-10 DIAGNOSIS — Z79899 Other long term (current) drug therapy: Secondary | ICD-10-CM | POA: Diagnosis not present

## 2017-10-10 DIAGNOSIS — M79601 Pain in right arm: Secondary | ICD-10-CM | POA: Diagnosis not present

## 2017-10-10 DIAGNOSIS — M47816 Spondylosis without myelopathy or radiculopathy, lumbar region: Secondary | ICD-10-CM | POA: Diagnosis not present

## 2017-10-10 DIAGNOSIS — M542 Cervicalgia: Secondary | ICD-10-CM | POA: Diagnosis not present

## 2017-10-10 DIAGNOSIS — M797 Fibromyalgia: Secondary | ICD-10-CM | POA: Diagnosis not present

## 2017-10-10 DIAGNOSIS — M545 Low back pain: Secondary | ICD-10-CM | POA: Diagnosis not present

## 2017-10-13 DIAGNOSIS — M79602 Pain in left arm: Secondary | ICD-10-CM | POA: Diagnosis not present

## 2017-10-13 DIAGNOSIS — M542 Cervicalgia: Secondary | ICD-10-CM | POA: Diagnosis not present

## 2017-10-13 DIAGNOSIS — M79601 Pain in right arm: Secondary | ICD-10-CM | POA: Diagnosis not present

## 2017-10-13 DIAGNOSIS — M545 Low back pain: Secondary | ICD-10-CM | POA: Diagnosis not present

## 2017-10-14 DIAGNOSIS — N938 Other specified abnormal uterine and vaginal bleeding: Secondary | ICD-10-CM | POA: Diagnosis not present

## 2017-10-15 ENCOUNTER — Ambulatory Visit (INDEPENDENT_AMBULATORY_CARE_PROVIDER_SITE_OTHER): Payer: BLUE CROSS/BLUE SHIELD | Admitting: *Deleted

## 2017-10-15 DIAGNOSIS — Z01419 Encounter for gynecological examination (general) (routine) without abnormal findings: Secondary | ICD-10-CM | POA: Diagnosis not present

## 2017-10-15 DIAGNOSIS — J309 Allergic rhinitis, unspecified: Secondary | ICD-10-CM

## 2017-10-15 DIAGNOSIS — D508 Other iron deficiency anemias: Secondary | ICD-10-CM | POA: Diagnosis not present

## 2017-10-15 DIAGNOSIS — N938 Other specified abnormal uterine and vaginal bleeding: Secondary | ICD-10-CM | POA: Diagnosis not present

## 2017-10-15 DIAGNOSIS — Z13228 Encounter for screening for other metabolic disorders: Secondary | ICD-10-CM | POA: Diagnosis not present

## 2017-10-16 ENCOUNTER — Other Ambulatory Visit: Payer: Self-pay | Admitting: Pediatrics

## 2017-10-16 DIAGNOSIS — M797 Fibromyalgia: Secondary | ICD-10-CM

## 2017-10-16 DIAGNOSIS — E282 Polycystic ovarian syndrome: Secondary | ICD-10-CM

## 2017-10-17 ENCOUNTER — Other Ambulatory Visit: Payer: Self-pay | Admitting: Pediatrics

## 2017-10-17 DIAGNOSIS — M47816 Spondylosis without myelopathy or radiculopathy, lumbar region: Secondary | ICD-10-CM | POA: Diagnosis not present

## 2017-10-17 DIAGNOSIS — L680 Hirsutism: Secondary | ICD-10-CM | POA: Diagnosis not present

## 2017-10-17 DIAGNOSIS — M797 Fibromyalgia: Secondary | ICD-10-CM | POA: Diagnosis not present

## 2017-10-20 ENCOUNTER — Encounter (INDEPENDENT_AMBULATORY_CARE_PROVIDER_SITE_OTHER): Payer: Self-pay | Admitting: Orthopaedic Surgery

## 2017-10-20 DIAGNOSIS — M79601 Pain in right arm: Secondary | ICD-10-CM | POA: Diagnosis not present

## 2017-10-20 DIAGNOSIS — M65311 Trigger thumb, right thumb: Secondary | ICD-10-CM | POA: Diagnosis not present

## 2017-10-20 DIAGNOSIS — M65312 Trigger thumb, left thumb: Secondary | ICD-10-CM | POA: Diagnosis not present

## 2017-10-20 DIAGNOSIS — M545 Low back pain: Secondary | ICD-10-CM | POA: Diagnosis not present

## 2017-10-20 DIAGNOSIS — M79602 Pain in left arm: Secondary | ICD-10-CM | POA: Diagnosis not present

## 2017-10-20 DIAGNOSIS — M542 Cervicalgia: Secondary | ICD-10-CM | POA: Diagnosis not present

## 2017-10-20 MED ORDER — METHYLPREDNISOLONE ACETATE 40 MG/ML IJ SUSP
13.3300 mg | INTRAMUSCULAR | Status: AC | PRN
  Administered 2017-10-20: 13.33 mg

## 2017-10-20 MED ORDER — LIDOCAINE HCL 1 % IJ SOLN
0.3000 mL | INTRAMUSCULAR | Status: AC | PRN
Start: 2017-10-20 — End: 2017-10-20
  Administered 2017-10-20: .3 mL

## 2017-10-20 MED ORDER — BUPIVACAINE HCL 0.5 % IJ SOLN
0.3300 mL | INTRAMUSCULAR | Status: AC | PRN
  Administered 2017-10-20: .33 mL

## 2017-10-20 MED ORDER — LIDOCAINE HCL 1 % IJ SOLN
0.3000 mL | INTRAMUSCULAR | Status: AC | PRN
  Administered 2017-10-20: .3 mL
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
## 2017-10-22 ENCOUNTER — Ambulatory Visit (INDEPENDENT_AMBULATORY_CARE_PROVIDER_SITE_OTHER): Payer: BLUE CROSS/BLUE SHIELD

## 2017-10-22 DIAGNOSIS — J309 Allergic rhinitis, unspecified: Secondary | ICD-10-CM | POA: Diagnosis not present

## 2017-10-23 ENCOUNTER — Ambulatory Visit (INDEPENDENT_AMBULATORY_CARE_PROVIDER_SITE_OTHER): Payer: BLUE CROSS/BLUE SHIELD | Admitting: Orthopaedic Surgery

## 2017-10-23 DIAGNOSIS — M79601 Pain in right arm: Secondary | ICD-10-CM | POA: Diagnosis not present

## 2017-10-23 DIAGNOSIS — M79602 Pain in left arm: Secondary | ICD-10-CM | POA: Diagnosis not present

## 2017-10-23 DIAGNOSIS — M542 Cervicalgia: Secondary | ICD-10-CM | POA: Diagnosis not present

## 2017-10-23 DIAGNOSIS — M545 Low back pain: Secondary | ICD-10-CM | POA: Diagnosis not present

## 2017-10-24 DIAGNOSIS — N938 Other specified abnormal uterine and vaginal bleeding: Secondary | ICD-10-CM | POA: Diagnosis not present

## 2017-10-24 DIAGNOSIS — D259 Leiomyoma of uterus, unspecified: Secondary | ICD-10-CM | POA: Diagnosis not present

## 2017-10-27 DIAGNOSIS — Z6827 Body mass index (BMI) 27.0-27.9, adult: Secondary | ICD-10-CM | POA: Diagnosis not present

## 2017-10-27 DIAGNOSIS — J4550 Severe persistent asthma, uncomplicated: Secondary | ICD-10-CM | POA: Diagnosis not present

## 2017-10-27 DIAGNOSIS — N938 Other specified abnormal uterine and vaginal bleeding: Secondary | ICD-10-CM | POA: Diagnosis not present

## 2017-10-28 ENCOUNTER — Other Ambulatory Visit: Payer: Self-pay | Admitting: *Deleted

## 2017-10-28 ENCOUNTER — Encounter: Payer: Self-pay | Admitting: Allergy and Immunology

## 2017-10-28 ENCOUNTER — Ambulatory Visit: Payer: BLUE CROSS/BLUE SHIELD

## 2017-10-28 ENCOUNTER — Ambulatory Visit (INDEPENDENT_AMBULATORY_CARE_PROVIDER_SITE_OTHER): Payer: BLUE CROSS/BLUE SHIELD | Admitting: Allergy and Immunology

## 2017-10-28 VITALS — BP 120/76 | HR 100 | Resp 20

## 2017-10-28 DIAGNOSIS — R519 Headache, unspecified: Secondary | ICD-10-CM

## 2017-10-28 DIAGNOSIS — J4550 Severe persistent asthma, uncomplicated: Secondary | ICD-10-CM

## 2017-10-28 DIAGNOSIS — R51 Headache: Secondary | ICD-10-CM | POA: Diagnosis not present

## 2017-10-28 DIAGNOSIS — J4551 Severe persistent asthma with (acute) exacerbation: Secondary | ICD-10-CM | POA: Diagnosis not present

## 2017-10-28 DIAGNOSIS — J3089 Other allergic rhinitis: Secondary | ICD-10-CM

## 2017-10-28 DIAGNOSIS — K219 Gastro-esophageal reflux disease without esophagitis: Secondary | ICD-10-CM | POA: Diagnosis not present

## 2017-10-28 DIAGNOSIS — H1045 Other chronic allergic conjunctivitis: Secondary | ICD-10-CM | POA: Diagnosis not present

## 2017-10-28 DIAGNOSIS — H1013 Acute atopic conjunctivitis, bilateral: Secondary | ICD-10-CM

## 2017-10-28 NOTE — Progress Notes (Signed)
Follow-up Note  Referring Provider: Eustaquio Maize, MD Primary Provider: Eustaquio Maize, MD Date of Office Visit: 10/28/2017  Subjective:   Jennifer Mullins (DOB: 1971-11-14) is a 46 y.o. female who returns to the [facility] on 10/28/2017 in re-evaluation of the following:  HPI: Ginnette returns to this clinic in reevaluation of her asthma and allergic rhinitis and allergic conjunctivitis with possible component of vernal conjunctivitis and LPR and headache disorder.  Her last visit to this clinic was 16 September 2017.  Overall she thought that her airway was doing relatively well since her last visit.  She rarely used a short acting bronchodilator and had no wheezing or coughing or nasal congestion or sneezing and has not required an antibiotic or systemic steroid to treat any type of respiratory tract issue.  Her eye issue is under good control with therapy prescribed by Dr. Delman Cheadle.  She has not been having any headaches.  Unfortunately, about 7 days ago she developed nasal congestion and a throbbing headache and some cough and some decreased ability to smell.  She has just been feeling bad in general but has not been having any fever or aches or chills.  She has not been having a significant amount of ugly nasal discharge or sputum production or chest pain.  She was in the hospital visiting her fianc for several days prior to the onset of this issue.  As well, she has had a vaginal bleeding episode and she will be visiting with her gynecologist sometime in the future.  Her fibromyalgia appears to be stable.  Her anemia is being treated with iron infusions.  Allergies as of 10/28/2017      Reactions   Demerol [meperidine] Shortness Of Breath   FLUSHING AND SHORTNESS OF BREATH   Morphine And Related Shortness Of Breath   Flushed and hot hyper Flushed and hot hyper   Diltiazem Hcl Hives   Lisinopril    Caused cough   Lyrica [pregabalin]    Chest pain, elevated heart  rate   Tramadol Other (See Comments)   Tears up stomach      Medication List      acetaminophen-codeine 300-30 MG tablet Commonly known as:  TYLENOL #3 Take 1-2 tablets by mouth every 6 (six) hours as needed for moderate pain.   albuterol (2.5 MG/3ML) 0.083% nebulizer solution Commonly known as:  PROVENTIL Take 2.5 mg by nebulization every 6 (six) hours as needed for wheezing or shortness of breath.   PROAIR HFA 108 (90 Base) MCG/ACT inhaler Generic drug:  albuterol INHALE 1-2 PUFFS EVERY 4 HOURS AS NEEDED WHEEZING OR SHORTNESS OF BREATH   ALPRAZolam 0.5 MG tablet Commonly known as:  XANAX Take 1 tablet (0.5 mg total) by mouth 3 (three) times daily as needed for anxiety.   aspirin 81 MG chewable tablet Chew 81 mg by mouth daily.   AUVI-Q 0.3 mg/0.3 mL Soaj injection Generic drug:  EPINEPHrine Use as directed for life-threatening allergic reaction.   cromolyn 4 % ophthalmic solution Commonly known as:  OPTICROM Place 1 drop into both eyes 4 (four) times daily.   diclofenac 0.1 % ophthalmic solution Commonly known as:  VOLTAREN Place 1 drop into both eyes 3 (three) times daily.   DULoxetine 20 MG capsule Commonly known as:  CYMBALTA Take 30 mg daily by mouth.   fluticasone 50 MCG/ACT nasal spray Commonly known as:  FLONASE USE ONE SPRAY in BOTH nostrils TWICE DAILY   furosemide 20  MG tablet Commonly known as:  LASIX Take 1 tablet (20 mg total) by mouth daily as needed for fluid.   HYDROcodone-acetaminophen 5-325 MG tablet Commonly known as:  NORCO/VICODIN   IFEREX 150 150 MG capsule Generic drug:  iron polysaccharides Take 150 mg by mouth.   IRON PO Take by mouth.   linaclotide 290 MCG Caps capsule Commonly known as:  LINZESS TAKE ONE CAPSULE BY MOUTH EVERY DAY BEFORE BREAKFAST   losartan 25 MG tablet Commonly known as:  COZAAR Take 0.5-1 tablets (12.5-25 mg total) by mouth daily.   medroxyPROGESTERone 10 MG tablet Commonly known as:  PROVERA     metFORMIN 500 MG tablet Commonly known as:  GLUCOPHAGE TAKE ONE TABLET BY MOUTH DAILY WITH BREAKFAST   montelukast 10 MG tablet Commonly known as:  SINGULAIR TAKE 1 TABLET BY MOUTH AT BEDTIME   NUCALA  Inject into the skin every 28 (twenty-eight) days.   ofloxacin 0.3 % OTIC solution Commonly known as:  FLOXIN INSTILL FIVE DROPS INTO THE RIGHT EAR DAILY   Olopatadine HCl 0.6 % Soln 2 sprays each nostril two times per day.   pantoprazole 40 MG tablet Commonly known as:  PROTONIX Take 1 tablet (40 mg total) by mouth daily.   potassium chloride SA 20 MEQ tablet Commonly known as:  K-DUR,KLOR-CON TAKE TWO TABLETS BY MOUTH DAILY FOR 3 DAYS, THEN TAKE 1 TABLET BY MOUTH DAILY WHILE TAKING LASIX   pravastatin 20 MG tablet Commonly known as:  PRAVACHOL Take 1 tablet (20 mg total) by mouth daily.   pregabalin 150 MG capsule Commonly known as:  LYRICA Take by mouth.   pseudoephedrine-guaifenesin 60-600 MG 12 hr tablet Commonly known as:  MUCINEX D Take 1 tablet by mouth every 12 (twelve) hours.   SYMBICORT 160-4.5 MCG/ACT inhaler Generic drug:  budesonide-formoterol inhale TWO puffs TWICE DAILY   triamcinolone cream 0.1 % Commonly known as:  KENALOG Apply 1 application 2 (two) times daily topically.   VOLTAREN 1 % Gel Generic drug:  diclofenac sodium APPLY 2G TOPICALLY FOUR TIMES DAILY   XIIDRA 5 % Soln Generic drug:  Lifitegrast Apply to eye 2 (two) times daily.       Past Medical History:  Diagnosis Date  . Anemia   . Anemia   . Anxiety   . Arthritis   . Asthma   . Bilateral calcaneal spurs   . Bilateral polycystic ovarian syndrome   . Bulging lumbar disc   . Carpal tunnel syndrome, bilateral   . Conjunctivitis   . COPD (chronic obstructive pulmonary disease) (HCC)    BRONCHITIS  . 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 ETD (eustachian tube dysfunction)   . ETD (Eustachian tube dysfunction), bilateral    left  worse than right  . Fibroids    uterine  . GAD (generalized anxiety disorder)   . GERD (gastroesophageal reflux disease)    TAKES PRESCRIPTION MEDS  . Glaucoma    BOTH EYES  . Hematoma    Hematoma of Left Hand- had to wear a split  . Hyperlipidemia   . Hypertension   . Hypothyroidism    NODULES ON THYROID  . PONV (postoperative nausea and vomiting)   . Pulmonary embolism ([facility])   . Tachycardia   . TMJ (dislocation of temporomandibular joint)   . Trigger finger   . Trigger finger of left thumb     Past Surgical History:  Procedure Laterality Date  .  ANTERIOR CERVICAL DECOMP/DISCECTOMY FUSION N/A 11/13/2016   Procedure: Cervical five-six, Cervical six-seven Anterior Cervical Discectomy and Fusion, Allograft, Plate;  Surgeon: Marybelle Killings, MD;  Location: [facility];  Service: Orthopedics;  Laterality: N/A;  . BREAST SURGERY    . CARPAL TUNNEL RELEASE    . DILATION AND CURETTAGE OF UTERUS    . EXPLORATORY LAPAROTOMY    . NASAL SINUS SURGERY  07/22/13   Dr. Redmond Pulling in [place]    Review of systems negative except as noted in HPI / PMHx or noted below:  Review of Systems  Constitutional: Negative.   HENT: Negative.   Eyes: Negative.   Respiratory: Negative.   Cardiovascular: Negative.   Gastrointestinal: Negative.   Genitourinary: Negative.   Musculoskeletal: Negative.   Skin: Negative.   Neurological: Negative.   Endo/Heme/Allergies: Negative.   Psychiatric/Behavioral: Negative.      Objective:   Vitals:   10/28/17 1524  BP: 120/76  Pulse: 100  Resp: 20          Physical Exam  Constitutional: She is well-developed, well-nourished, and in no distress.  HENT:  Head: Normocephalic.  Right Ear: Tympanic membrane, external ear and ear canal normal.  Left Ear: Tympanic membrane, external ear and ear canal normal.  Nose: Mucosal edema (Erythematous) present. No rhinorrhea.  Mouth/Throat: Uvula is midline, oropharynx is clear and moist and mucous membranes are normal. No  oropharyngeal exudate.  Eyes: Conjunctivae are normal.  Neck: Trachea normal. No tracheal tenderness present. No tracheal deviation present. No thyromegaly present.  Cardiovascular: Normal rate, regular rhythm, S1 normal, S2 normal and normal heart sounds.  No murmur heard. Pulmonary/Chest: No stridor. No respiratory distress. She has wheezes (Bilateral expiratory wheezes posterior lung fields left greater than right). She has no rales.  Musculoskeletal: She exhibits no edema.  Lymphadenopathy:       Head (right side): No tonsillar adenopathy present.       Head (left side): No tonsillar adenopathy present.    She has no cervical adenopathy.  Neurological: She is alert. Gait normal.  Skin: No rash noted. She is not diaphoretic. No erythema. Nails show no clubbing.  Psychiatric: Mood and affect normal.    Diagnostics:    Spirometry was performed and demonstrated an FEV1 of 1.68 at 74 % of predicted.  The patient had an Asthma Control Test with the following results: ACT Total Score: 19.    Assessment and Plan:   1. Asthma, not well controlled, severe persistent, with acute exacerbation   2. Other allergic rhinitis   3. Perennial allergic conjunctivitis of both eyes   4. Headache disorder   5. LPRD (laryngopharyngeal reflux disease)     1. Continue Immunotherapy and Epi-Pen / Auvi-Q  2. Continue to Treat and prevent inflammation:   A. Symbicort 160 - 1-2 inhalations twice a day  B. Flonase - one spray each nostril twice a day  C. montelukast - one tablet one time per day  D. Nucala injections every 4 weeks  3. Continue to Treat and prevent reflux:   A. pantoprazole 40 mg in a.m.  4. If needed:   A. Patanase 2 sprays each nostril two times per day  B. OTC antihistamine  C. Proventil HFA or similar 2 inhalations every 4-6 hours  5. Continue eye therapy with Dr. Delman Cheadle.  6. For this recent episode:   A. Nasal saline several times per day  B. Prednisone 10mg  - 2 now,  then 1 tab 1 time per day for 10  days  C. OTC Mucinex DM twice a day  D. Antibiotics?  6. Return in 12 weeks or earlier if problem  [name] appears to have a viral respiratory tract infection with a flare of her asthma for which she will utilize a relatively low dose of systemic steroids while she continues on a large collection of anti-inflammatory agents for her respiratory tract.  In addition, she will remain on aggressive therapy directed against reflux as noted above.  I am going to hold off on any antibiotics at this point in time and she will keep in contact with me noting her response over the course of the next several days or weeks to the plan noted above.  If she does well I will see her back in his clinic in 12 weeks or earlier if there is a problem.  Allena Katz, MD [facility] / [facility]

## 2017-10-28 NOTE — Patient Instructions (Addendum)
  1. Continue Immunotherapy and Epi-Pen / Auvi-Q  2. Continue to Treat and prevent inflammation:   A. Symbicort 160 - 1-2 inhalations twice a day  B. Flonase - one spray each nostril twice a day  C. montelukast - one tablet one time per day  D. Nucala injections every 4 weeks  3. Continue to Treat and prevent reflux:   A. pantoprazole 40 mg in a.m.  4. If needed:   A. Patanase 2 sprays each nostril two times per day  B. OTC antihistamine  C. Proventil HFA or similar 2 inhalations every 4-6 hours  5. Continue eye therapy with Dr. Delman Cheadle.  6. For this recent episode:   A. Nasal saline several times per day  B. Prednisone 10mg  - 2 now, then 1 tab 1 time per day for 10 days  C. OTC Mucinex DM twice a day  D. Antibiotics?  6. Return in 12 weeks or earlier if problem

## 2017-10-29 ENCOUNTER — Encounter: Payer: Self-pay | Admitting: Allergy and Immunology

## 2017-10-29 ENCOUNTER — Encounter (INDEPENDENT_AMBULATORY_CARE_PROVIDER_SITE_OTHER): Payer: Self-pay | Admitting: Orthopaedic Surgery

## 2017-10-29 ENCOUNTER — Ambulatory Visit (INDEPENDENT_AMBULATORY_CARE_PROVIDER_SITE_OTHER): Payer: BLUE CROSS/BLUE SHIELD | Admitting: Orthopaedic Surgery

## 2017-10-29 VITALS — BP 134/88 | HR 97

## 2017-10-29 DIAGNOSIS — M25521 Pain in right elbow: Secondary | ICD-10-CM | POA: Insufficient documentation

## 2017-10-29 DIAGNOSIS — M25571 Pain in right ankle and joints of right foot: Secondary | ICD-10-CM | POA: Insufficient documentation

## 2017-10-29 DIAGNOSIS — N938 Other specified abnormal uterine and vaginal bleeding: Secondary | ICD-10-CM | POA: Diagnosis not present

## 2017-10-29 DIAGNOSIS — M7711 Lateral epicondylitis, right elbow: Secondary | ICD-10-CM

## 2017-10-29 DIAGNOSIS — M25561 Pain in right knee: Secondary | ICD-10-CM

## 2017-10-29 DIAGNOSIS — Z981 Arthrodesis status: Secondary | ICD-10-CM | POA: Diagnosis not present

## 2017-10-29 DIAGNOSIS — G8929 Other chronic pain: Secondary | ICD-10-CM | POA: Insufficient documentation

## 2017-10-29 DIAGNOSIS — Z6827 Body mass index (BMI) 27.0-27.9, adult: Secondary | ICD-10-CM | POA: Diagnosis not present

## 2017-10-29 MED ORDER — BUPIVACAINE HCL 0.5 % IJ SOLN
1.0000 mL | INTRAMUSCULAR | Status: AC | PRN
  Administered 2017-10-29: 1 mL via INTRA_ARTICULAR

## 2017-10-29 MED ORDER — LIDOCAINE HCL 1 % IJ SOLN
0.5000 mL | INTRAMUSCULAR | Status: AC | PRN
  Administered 2017-10-29: .5 mL

## 2017-10-29 MED ORDER — METHYLPREDNISOLONE ACETATE 40 MG/ML IJ SUSP
40.0000 mg | INTRAMUSCULAR | Status: AC | PRN
  Administered 2017-10-29: 40 mg via INTRA_ARTICULAR

## 2017-10-29 NOTE — Progress Notes (Addendum)
Office Visit Note   Patient: Jennifer Mullins           Date of Birth: April 28, 1971           MRN: [id number] Visit Date: 10/29/2017              Requested by: Eustaquio Maize, MD [place], [place] [address] PCP: Eustaquio Maize, MD   Assessment & Plan: Visit Diagnoses:  1. Right elbow pain   2. Chronic pain of right knee   3. Pain in right ankle and joints of right foot   4. S/P cervical spinal fusion   5. Thumb triggering.   Plan: Patient's asthma recently been worse she is under treatment for this currently on prednisone.  She is off the Eliquis and left thumb triggering is better but right  thumb still continues to catch occasionally.  We discussed trying to do workout activity that does not aggravate her multiple areas of pain including her elbow her thumbs her knees her foot her back or tailbone, her shoulders her lower back and her neck.  She continues to have triggering of her thumb she can call.  Patient requested lateral epicondylar injection right elbow which was performed.  Follow-Up Instructions: No Follow-up on file.   Orders:  No orders of the defined types were placed in this encounter.  No orders of the defined types were placed in this encounter.     Procedures: Medium Joint Inj: R lateral epicondyle on 10/29/2017 9:02 AM Indications: pain Details: 22 G 1.5 in needle, anterolateral approach Medications: 1 mL bupivacaine 0.5 %; 40 mg methylPREDNISolone acetate 40 MG/ML; 0.5 mL lidocaine 1 % Outcome: tolerated well, no immediate complications Procedure, treatment alternatives, risks and benefits explained, specific risks discussed. Consent was given by the patient. Immediately prior to procedure a time out was called to verify the correct patient, procedure, equipment, support staff and site/side marked as required. Patient was prepped and draped in the usual sterile fashion.       Clinical Data: No additional findings.   Subjective: Chief  Complaint  Patient presents with  . Right Knee - Pain, Follow-up  . Right Ankle - Pain  . Right Elbow - Pain    HPI 46 year old female with multiple areas of complaints of pain.  She states she feels like her right knee is going into varus.  She notices some prominence adjacent medial femoral condyle.  She states also her right ankle seems to be turning and she has had custom orthotics made for this.  She had previous injection by Dr. Collie Siad many years ago likely for lateral epicondylitis right elbow.  She states her diabetes is been better but she is had more problems with asthma recently started on some prednisone and now she thinks her sugar is high.  She has had problems with vaginal bleeding.  She started on some new medication for asthma she is been sent to physical therapy and has been doing squats and states is making her tailbone more painful.  She is also having more problems with her knee with certain exercises that they had started.  Cervical fusion done in 2017 in December on previous x-rays showed solid fusion and neck incision is well-healed.  She is going to the pain clinic has been started on long-term chronic narcotic medication.  She was on 2 pills for her diabetes type 2 and then she was cut back to 1 pill.  She can check her sugar  at home but has not checked it today is concerned the prednisone may have raised her sugar.  Patient states that she was approved for disability 2 months ago. CBG was 61 tested in office today. Review of Systems 14 point review of systems updated and unchanged from 09/25/2017 office note.  Of note is previous history of DVT with pulmonary embolism.  Previous long-term Eliquis treatment stopped due to vaginal bleeding problems.   Objective: Vital Signs: BP 134/88   Pulse 97   Physical Exam  Constitutional: She is oriented to person, place, and time. She appears well-developed.  HENT:  Head: Normocephalic.  Right Ear: External ear normal.  Left Ear:  External ear normal.  Eyes: Pupils are equal, round, and reactive to light.  Neck: No tracheal deviation present. No thyromegaly present.  Well-healed anterior cervical incision.  Good rotation cervical spine.  Cardiovascular: Normal rate.  Pulmonary/Chest: Effort normal.  Abdominal: Soft.  Neurological: She is alert and oriented to person, place, and time.  Skin: Skin is warm and dry.  Psychiatric: She has a normal mood and affect. Her behavior is normal.    Ortho Exam patient is able ambulate posterior tibial function is intact she can ambulate on her toes no posterior tib weakness.  Normal hip range of motion knees reach full extension there is no knee effusion.  She has tenderness with palpation of about the knee particularly over the medial femoral condyle without plica.  Inspection of her lower extremity in standing position she does not appear to have any varus or valgus malalignment in standing position nor with walking.  Patient continues to me that her knee is turning in and her foot is going into pronation but this is not present on today's exam.  Specialty Comments:  No specialty comments available.  Imaging: No results found.   PMFS History: Patient Active Problem List   Diagnosis Date Noted  . Pain in right ankle and joints of right foot 10/29/2017  . Chronic pain of right knee 10/29/2017  . Right elbow pain 10/29/2017  . Iron deficiency anemia due to chronic blood loss 07/07/2017  . S/P cervical spinal fusion 02/27/2017  . Pulmonary embolism ([facility]) 11/24/2016  . Dysfunctional uterine bleeding 11/24/2016  . Anticoagulated 11/24/2016  . Microcytic anemia 11/24/2016  . Tachycardia   . Vaginal bleeding   . Allergic rhinoconjunctivitis 07/28/2015  . Chronic headache 07/28/2015  . PCOS (polycystic ovarian syndrome) 06/20/2015  . 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 Hyperlipidemia LDL goal <100 06/20/2015  . GAD (generalized anxiety disorder) 05/18/2014  . GERD  (gastroesophageal reflux disease) 05/18/2014  . Body fluid retention 07/07/2013  . Chronic sinusitis 07/02/2013  . Extrinsic asthma 05/20/2013  . HTN (hypertension) 05/20/2013  . Prediabetes 05/20/2013   Past Medical History:  Diagnosis Date  . Anemia   . Anemia   . Anxiety   . Arthritis   . Asthma   . Bilateral calcaneal spurs   . Bilateral polycystic ovarian syndrome   . Bulging lumbar disc   . Carpal tunnel syndrome, bilateral   . Conjunctivitis   . COPD (chronic obstructive pulmonary disease) ([facility])    BRONCHITIS  . Diabetes mellitus without complication ([facility])    TYPE 2   DX  4-5 YRS AGO  . ETD (eustachian tube dysfunction)   . ETD (Eustachian tube dysfunction), bilateral    left worse than right  . Fibroids    uterine  . GAD (generalized anxiety disorder)   . GERD (gastroesophageal reflux disease)  TAKES PRESCRIPTION MEDS  . Glaucoma    BOTH EYES  . Hematoma    Hematoma of Left Hand- had to wear a split  . Hyperlipidemia   . Hypertension   . Hypothyroidism    NODULES ON THYROID  . PONV (postoperative nausea and vomiting)   . Pulmonary embolism ([facility])   . Tachycardia   . TMJ (dislocation of temporomandibular joint)   . Trigger finger   . Trigger finger of left thumb     Family History  Problem Relation Age of Onset  . Hypertension Mother   . Hypertension Father     Past Surgical History:  Procedure Laterality Date  . ANTERIOR CERVICAL DECOMP/DISCECTOMY FUSION N/A 11/13/2016   Procedure: Cervical five-six, Cervical six-seven Anterior Cervical Discectomy and Fusion, Allograft, Plate;  Surgeon: Marybelle Killings, MD;  Location: [facility];  Service: Orthopedics;  Laterality: N/A;  . BREAST SURGERY    . CARPAL TUNNEL RELEASE    . DILATION AND CURETTAGE OF UTERUS    . EXPLORATORY LAPAROTOMY    . NASAL SINUS SURGERY  07/22/13   Dr. Redmond Pulling in [place] History  . Occupation: Unemployed  Tobacco Use  . Smoking status: Former Smoker     Packs/day: 0.25    Years: 5.00    Pack years: 1.25    Types: Cigarettes    Last attempt to quit: 11/18/1989    Years since quitting: 27.9  . Smokeless tobacco: Never Used  Substance and Sexual Activity  . Alcohol use: No  . Drug use: No  . Sexual activity: Not on file

## 2017-10-30 ENCOUNTER — Ambulatory Visit (INDEPENDENT_AMBULATORY_CARE_PROVIDER_SITE_OTHER): Payer: BLUE CROSS/BLUE SHIELD | Admitting: Podiatry

## 2017-10-30 ENCOUNTER — Telehealth: Payer: Self-pay | Admitting: Allergy and Immunology

## 2017-10-30 ENCOUNTER — Other Ambulatory Visit: Payer: Self-pay | Admitting: Allergy and Immunology

## 2017-10-30 ENCOUNTER — Encounter: Payer: Self-pay | Admitting: Podiatry

## 2017-10-30 ENCOUNTER — Telehealth (INDEPENDENT_AMBULATORY_CARE_PROVIDER_SITE_OTHER): Payer: Self-pay | Admitting: Orthopaedic Surgery

## 2017-10-30 DIAGNOSIS — M722 Plantar fascial fibromatosis: Secondary | ICD-10-CM

## 2017-10-30 MED ORDER — MUPIROCIN 2 % EX OINT: 1.0000 "application " | TOPICAL_OINTMENT | Freq: Three times a day (TID) | CUTANEOUS | 0 refills | Status: AC

## 2017-10-30 NOTE — Telephone Encounter (Signed)
FYI - patient said Dr. Lorin Mercy will be receiving some office notes from Dr. Milinda Pointer, her foot doctor. She just wanted to make him aware.

## 2017-10-30 NOTE — Telephone Encounter (Signed)
fyi  Patient also came to office today needing note that she had elbow injection yesterday. She received copy of office note and states that her left thumb is better and she continues to have trouble with the right. Our office note states differently. She would like for this to be amended in the note. Thanks.

## 2017-10-30 NOTE — Telephone Encounter (Signed)
Please have her apply Bactroban ointment to that area 3 times a day for the next 10 days or until healed up.

## 2017-10-30 NOTE — Progress Notes (Signed)
She presents today for follow-up of her plantar fasciitis capsulitis of the right ankle and foot.  She states that she has noticed that she is having some swelling in her medial knee but when she wears her orthotics she states that the knee does not hurt as bad nor does the foot hurt as badly.  She states that she cannot wear the orthotics at all times.  Objective: Vital signs are stable alert and oriented x3.  Pulses are palpable.  Neurologic sensorium is intact deep tendon reflexes are intact muscle strength is normal.  She has pain on palpation to the medial calcaneal tubercle does have some swelling around her medial knee with mild erythema.  She states that Dr. Lorin Mercy is taking x-rays and does not think that there is anything wrong with her knee though with ambulation today is obvious that she has a little coxa vera and genu valgum.  This appears to be more appreciable on the right side.  Assessment: Fasciitis with knee pain right.  Plan: I recommended that she wear her orthotics as often as possible regular basis.  I will also write a letter to Dr. Lorin Mercy.

## 2017-10-30 NOTE — Telephone Encounter (Signed)
Patient aware and medication sent in.

## 2017-10-30 NOTE — Telephone Encounter (Signed)
Pt called and said that her nose on right side still bleeding when she blow . [facility] [id number].

## 2017-10-31 ENCOUNTER — Encounter: Payer: Self-pay | Admitting: Allergy and Immunology

## 2017-10-31 DIAGNOSIS — J309 Allergic rhinitis, unspecified: Secondary | ICD-10-CM

## 2017-10-31 NOTE — Progress Notes (Signed)
This encounter was created in error - please disregard.

## 2017-10-31 NOTE — Telephone Encounter (Signed)
Thanks, ucall, note corrected . Tell her thank you

## 2017-11-03 DIAGNOSIS — M79601 Pain in right arm: Secondary | ICD-10-CM | POA: Diagnosis not present

## 2017-11-03 DIAGNOSIS — M545 Low back pain: Secondary | ICD-10-CM | POA: Diagnosis not present

## 2017-11-03 DIAGNOSIS — M79602 Pain in left arm: Secondary | ICD-10-CM | POA: Diagnosis not present

## 2017-11-03 DIAGNOSIS — M542 Cervicalgia: Secondary | ICD-10-CM | POA: Diagnosis not present

## 2017-11-04 ENCOUNTER — Other Ambulatory Visit: Payer: Self-pay | Admitting: Pediatrics

## 2017-11-04 ENCOUNTER — Ambulatory Visit: Payer: Self-pay

## 2017-11-04 ENCOUNTER — Telehealth: Payer: Self-pay | Admitting: Allergy and Immunology

## 2017-11-04 MED ORDER — AMOXICILLIN-POT CLAVULANATE 875-125 MG PO TABS
1.0000 | ORAL_TABLET | Freq: Two times a day (BID) | ORAL | 0 refills | Status: AC
Start: 2017-11-04 — End: 2017-11-14

## 2017-11-04 NOTE — Telephone Encounter (Signed)
Patient has been informed and advised 

## 2017-11-04 NOTE — Telephone Encounter (Signed)
Patient is calling complaining of cough, green sputum, green nasal discharge. Can antibiotics be sent in??

## 2017-11-04 NOTE — Telephone Encounter (Signed)
Please administer augmentin 875 one tablet two times a day for 10 days.

## 2017-11-05 ENCOUNTER — Telehealth: Payer: Self-pay | Admitting: Allergy and Immunology

## 2017-11-05 NOTE — Telephone Encounter (Signed)
Please inform patient that Augmentin is very broad spectrum and should clear a respiratory tract infection. And please ask her if she needs a diflucan 150 tablet, single dose, for yeast overgrowth?

## 2017-11-05 NOTE — Telephone Encounter (Signed)
Patient has been placed on a high antibiotic Would like yeast infection medication called in to [facility] Please call patient with any questions

## 2017-11-05 NOTE — Telephone Encounter (Signed)
Please advise 

## 2017-11-06 DIAGNOSIS — L700 Acne vulgaris: Secondary | ICD-10-CM | POA: Diagnosis not present

## 2017-11-06 DIAGNOSIS — L689 Hypertrichosis, unspecified: Secondary | ICD-10-CM | POA: Diagnosis not present

## 2017-11-06 MED ORDER — FLUCONAZOLE 150 MG PO TABS: 150.0000 mg | ORAL_TABLET | Freq: Every day | ORAL | 0 refills | Status: DC

## 2017-11-06 NOTE — Telephone Encounter (Signed)
I have sent rx in to pharmacy. Patient states that she is already starting to itch so I told her to go get medication and if she needed anything further to please let us know.

## 2017-11-12 ENCOUNTER — Ambulatory Visit (INDEPENDENT_AMBULATORY_CARE_PROVIDER_SITE_OTHER): Payer: BLUE CROSS/BLUE SHIELD

## 2017-11-12 DIAGNOSIS — J309 Allergic rhinitis, unspecified: Secondary | ICD-10-CM

## 2017-11-14 DIAGNOSIS — H40013 Open angle with borderline findings, low risk, bilateral: Secondary | ICD-10-CM | POA: Diagnosis not present

## 2017-11-16 ENCOUNTER — Other Ambulatory Visit: Payer: Self-pay | Admitting: Allergy and Immunology

## 2017-11-17 DIAGNOSIS — Z79899 Other long term (current) drug therapy: Secondary | ICD-10-CM | POA: Diagnosis not present

## 2017-11-17 DIAGNOSIS — M542 Cervicalgia: Secondary | ICD-10-CM | POA: Diagnosis not present

## 2017-11-19 ENCOUNTER — Other Ambulatory Visit: Payer: Self-pay | Admitting: Allergy and Immunology

## 2017-11-20 ENCOUNTER — Ambulatory Visit (INDEPENDENT_AMBULATORY_CARE_PROVIDER_SITE_OTHER): Payer: BLUE CROSS/BLUE SHIELD

## 2017-11-20 DIAGNOSIS — J309 Allergic rhinitis, unspecified: Secondary | ICD-10-CM | POA: Diagnosis not present

## 2017-11-21 DIAGNOSIS — M542 Cervicalgia: Secondary | ICD-10-CM | POA: Diagnosis not present

## 2017-11-21 DIAGNOSIS — M79602 Pain in left arm: Secondary | ICD-10-CM | POA: Diagnosis not present

## 2017-11-21 DIAGNOSIS — M79601 Pain in right arm: Secondary | ICD-10-CM | POA: Diagnosis not present

## 2017-11-21 DIAGNOSIS — M545 Low back pain: Secondary | ICD-10-CM | POA: Diagnosis not present

## 2017-11-24 DIAGNOSIS — M79601 Pain in right arm: Secondary | ICD-10-CM | POA: Diagnosis not present

## 2017-11-24 DIAGNOSIS — J4550 Severe persistent asthma, uncomplicated: Secondary | ICD-10-CM | POA: Diagnosis not present

## 2017-11-24 DIAGNOSIS — M545 Low back pain: Secondary | ICD-10-CM | POA: Diagnosis not present

## 2017-11-24 DIAGNOSIS — M79602 Pain in left arm: Secondary | ICD-10-CM | POA: Diagnosis not present

## 2017-11-24 DIAGNOSIS — M542 Cervicalgia: Secondary | ICD-10-CM | POA: Diagnosis not present

## 2017-11-25 ENCOUNTER — Ambulatory Visit (INDEPENDENT_AMBULATORY_CARE_PROVIDER_SITE_OTHER): Payer: BLUE CROSS/BLUE SHIELD | Admitting: *Deleted

## 2017-11-25 DIAGNOSIS — J4550 Severe persistent asthma, uncomplicated: Secondary | ICD-10-CM | POA: Diagnosis not present

## 2017-11-27 ENCOUNTER — Ambulatory Visit (INDEPENDENT_AMBULATORY_CARE_PROVIDER_SITE_OTHER): Payer: BLUE CROSS/BLUE SHIELD

## 2017-11-27 DIAGNOSIS — J309 Allergic rhinitis, unspecified: Secondary | ICD-10-CM | POA: Diagnosis not present

## 2017-12-02 ENCOUNTER — Ambulatory Visit (INDEPENDENT_AMBULATORY_CARE_PROVIDER_SITE_OTHER): Payer: BLUE CROSS/BLUE SHIELD | Admitting: *Deleted

## 2017-12-02 ENCOUNTER — Telehealth: Payer: Self-pay | Admitting: *Deleted

## 2017-12-02 ENCOUNTER — Telehealth (INDEPENDENT_AMBULATORY_CARE_PROVIDER_SITE_OTHER): Payer: Self-pay | Admitting: Orthopaedic Surgery

## 2017-12-02 DIAGNOSIS — J309 Allergic rhinitis, unspecified: Secondary | ICD-10-CM

## 2017-12-02 NOTE — Telephone Encounter (Signed)
Please advise 

## 2017-12-02 NOTE — Telephone Encounter (Signed)
Patient called asking if Dr. Lorin Mercy has received an email from Dr. Milinda Pointer in regards to patients past appointment. CB # [phone]

## 2017-12-02 NOTE — Telephone Encounter (Signed)
Pt states she wanted to know if Dr. Milinda Pointer had sent a message to Dr. Ellwood Sayers at [facility] about her knee and if not to do so, then let her know before her appt 12/11/2017.

## 2017-12-02 NOTE — Telephone Encounter (Signed)
I called discussed. I sent her picture. allignment in knee is OK. Off blood thinner , take aleve 2 po bid for a week then PRN.

## 2017-12-03 ENCOUNTER — Other Ambulatory Visit: Payer: Self-pay | Admitting: Pediatrics

## 2017-12-03 ENCOUNTER — Telehealth: Payer: Self-pay

## 2017-12-03 DIAGNOSIS — M79601 Pain in right arm: Secondary | ICD-10-CM | POA: Diagnosis not present

## 2017-12-03 DIAGNOSIS — M542 Cervicalgia: Secondary | ICD-10-CM | POA: Diagnosis not present

## 2017-12-03 DIAGNOSIS — M79602 Pain in left arm: Secondary | ICD-10-CM | POA: Diagnosis not present

## 2017-12-03 DIAGNOSIS — M545 Low back pain: Secondary | ICD-10-CM | POA: Diagnosis not present

## 2017-12-03 DIAGNOSIS — F411 Generalized anxiety disorder: Secondary | ICD-10-CM

## 2017-12-03 NOTE — Telephone Encounter (Signed)
Has an appt in two weeks, will refill then

## 2017-12-03 NOTE — Telephone Encounter (Signed)
Patient aware about Xanax getting filled at apt on [address]. Patient states she will need 3 pills for the 29th and get the rest refilled when she see's Dr. Evette Doffing. Please advise

## 2017-12-03 NOTE — Telephone Encounter (Signed)
I informed pt the last chart note from Dr. Milinda Pointer had been faxed to Dr. Rodell Perna [facility].

## 2017-12-03 NOTE — Telephone Encounter (Signed)
Last seen 09/26/17  Dr Evette Doffing

## 2017-12-03 NOTE — Telephone Encounter (Signed)
Can you move appt sooner

## 2017-12-03 NOTE — Telephone Encounter (Signed)
Please see that Dr. Rodell Perna gets a copy of my last note concerning her right knee pain.  Thanks

## 2017-12-03 NOTE — Telephone Encounter (Signed)
Offered appt to patient for earlier date. Unable to work out a different appt due to patient having multiple Drs appts and no available openings here. Patient would still like for 3 xanax pills to be sent in on 1-29 until she can get to appt on the 1-30. Patient also wants to know if you want to go ahead and change her losartan to something else. She is concerned about the recall and would like it changed. She states that she took HCTZ years ago and is willing to change back to that if you feel its appropriate. Please advise

## 2017-12-03 NOTE — Telephone Encounter (Signed)
Faxed LOV from Dr. Milinda Pointer to Dr. Rodell Perna.

## 2017-12-05 ENCOUNTER — Telehealth: Payer: Self-pay | Admitting: Pediatrics

## 2017-12-05 DIAGNOSIS — H40013 Open angle with borderline findings, low risk, bilateral: Secondary | ICD-10-CM | POA: Diagnosis not present

## 2017-12-05 MED ORDER — ALPRAZOLAM 0.5 MG PO TABS: 0.5000 mg | ORAL_TABLET | Freq: Three times a day (TID) | ORAL | 0 refills | Status: DC | PRN

## 2017-12-05 NOTE — Telephone Encounter (Signed)
I sent in xanax so she will not run out. She should call her pharmacy to see if the lot numbers of losartan she received were affected by the recall.

## 2017-12-05 NOTE — Addendum Note (Signed)
Addended by: Eustaquio Maize on: 12/05/2017 01:51 PM   Modules accepted: Orders

## 2017-12-05 NOTE — Telephone Encounter (Signed)
Aware per details left on voice mail for patient.

## 2017-12-08 NOTE — Telephone Encounter (Signed)
Pt states her pharmacy says her lot # wasn't affected by the recalled but she has been taking Losartan 1 pill twice daily for increased BP with readings of 144/88 pulse 91, 130/84 pulse 102, 140/72 pulse 98. Please advise.

## 2017-12-08 NOTE — Telephone Encounter (Signed)
Per her chart she has losartan 25mg  tabs at home, is that correct? OK to take 2 at the same time in the morning to improve her BP. She has mucinex D on her med list, is she still taking this? The decongestant in it can cause elevated blood pressures.

## 2017-12-08 NOTE — Progress Notes (Signed)
This encounter was created in error - please disregard.

## 2017-12-08 NOTE — Telephone Encounter (Signed)
Pt aware of recommendations

## 2017-12-09 ENCOUNTER — Emergency Department (HOSPITAL_COMMUNITY)
Admission: EM | Admit: 2017-12-09 | Discharge: 2017-12-10 | Disposition: A | Discharge status: Home / Self Care | Payer: BLUE CROSS/BLUE SHIELD | Attending: Emergency Medicine | Admitting: Emergency Medicine

## 2017-12-09 ENCOUNTER — Ambulatory Visit (INDEPENDENT_AMBULATORY_CARE_PROVIDER_SITE_OTHER): Payer: BLUE CROSS/BLUE SHIELD | Admitting: Obstetrics & Gynecology

## 2017-12-09 ENCOUNTER — Other Ambulatory Visit: Payer: Self-pay

## 2017-12-09 ENCOUNTER — Encounter (HOSPITAL_COMMUNITY): Payer: Self-pay | Admitting: Emergency Medicine

## 2017-12-09 ENCOUNTER — Encounter: Payer: Self-pay | Admitting: Obstetrics & Gynecology

## 2017-12-09 VITALS — BP 139/89 | HR 106 | Ht 62.0 in | Wt 150.0 lb

## 2017-12-09 DIAGNOSIS — Z87891 Personal history of nicotine dependence: Secondary | ICD-10-CM | POA: Insufficient documentation

## 2017-12-09 DIAGNOSIS — E119 Type 2 diabetes mellitus without complications: Secondary | ICD-10-CM | POA: Insufficient documentation

## 2017-12-09 DIAGNOSIS — J029 Acute pharyngitis, unspecified: Secondary | ICD-10-CM | POA: Diagnosis not present

## 2017-12-09 DIAGNOSIS — N941 Unspecified dyspareunia: Secondary | ICD-10-CM

## 2017-12-09 DIAGNOSIS — Z79899 Other long term (current) drug therapy: Secondary | ICD-10-CM | POA: Diagnosis not present

## 2017-12-09 DIAGNOSIS — I10 Essential (primary) hypertension: Secondary | ICD-10-CM | POA: Insufficient documentation

## 2017-12-09 DIAGNOSIS — J449 Chronic obstructive pulmonary disease, unspecified: Secondary | ICD-10-CM | POA: Diagnosis not present

## 2017-12-09 DIAGNOSIS — N921 Excessive and frequent menstruation with irregular cycle: Secondary | ICD-10-CM | POA: Diagnosis not present

## 2017-12-09 DIAGNOSIS — R102 Pelvic and perineal pain: Secondary | ICD-10-CM

## 2017-12-09 DIAGNOSIS — E039 Hypothyroidism, unspecified: Secondary | ICD-10-CM | POA: Diagnosis not present

## 2017-12-09 DIAGNOSIS — B9789 Other viral agents as the cause of diseases classified elsewhere: Secondary | ICD-10-CM | POA: Insufficient documentation

## 2017-12-09 DIAGNOSIS — R300 Dysuria: Secondary | ICD-10-CM | POA: Diagnosis not present

## 2017-12-09 DIAGNOSIS — J45909 Unspecified asthma, uncomplicated: Secondary | ICD-10-CM | POA: Diagnosis not present

## 2017-12-09 DIAGNOSIS — N3001 Acute cystitis with hematuria: Secondary | ICD-10-CM | POA: Insufficient documentation

## 2017-12-09 DIAGNOSIS — H9209 Otalgia, unspecified ear: Secondary | ICD-10-CM | POA: Diagnosis not present

## 2017-12-09 DIAGNOSIS — Z7982 Long term (current) use of aspirin: Secondary | ICD-10-CM | POA: Diagnosis not present

## 2017-12-09 LAB — URINALYSIS, ROUTINE W REFLEX MICROSCOPIC
Bilirubin Urine: NEGATIVE
Glucose, UA: NEGATIVE mg/dL
Ketones, ur: NEGATIVE mg/dL
Leukocytes, UA: NEGATIVE
Nitrite: NEGATIVE
Protein, ur: NEGATIVE mg/dL
Specific Gravity, Urine: 1.014 (ref 1.005–1.030)
pH: 5 (ref 5.0–8.0)

## 2017-12-09 LAB — RAPID STREP SCREEN (MED CTR MEBANE ONLY): Streptococcus, Group A Screen (Direct): NEGATIVE

## 2017-12-09 MED ORDER — AMOXICILLIN-POT CLAVULANATE 875-125 MG PO TABS: 1.0000 | ORAL_TABLET | Freq: Two times a day (BID) | ORAL | 0 refills | Status: DC

## 2017-12-09 NOTE — ED Triage Notes (Signed)
Pt c/o sore throat for the past few days getting worse unable to swallow, pt states she is having frequency with urination and wants to check if she is having a UTI.

## 2017-12-09 NOTE — ED Provider Notes (Signed)
[facility] EMERGENCY DEPARTMENT Provider Note   CSN: [id number] Arrival date & time: 12/09/17  2228     History   Chief Complaint Chief Complaint  Patient presents with  . Sore Throat    HPI Jennifer Mullins is a 47 y.o. female.  HPI  This is a 47 year old female with history of anemia, conjunctivitis, COPD, diabetes who presents with sore throat and dysuria.  Patient reports 1 week history of worsening sore throat.  Over the last 24 hours, worsening pain and "needles in my throat."  Patient denies any fevers or chills.  She has had sick contacts.  Her husband is hospitalized.  She reports nasal congestion and ear fullness.  She is able to swallow but it hurts with swallowing.  She denies any nausea, vomiting, diarrhea, abdominal pain, chest pain, shortness of breath.  Does report dry cough.  Also reports dysuria and "I think I may have a UTI."  Current pain is rated at 6 out of 10.  She has taken Listerine with minimal relief of her symptoms.  Past Medical History:  Diagnosis Date  . Anemia   . Anemia   . Anxiety   . Arthritis   . Asthma   . Bilateral calcaneal spurs   . Bilateral polycystic ovarian syndrome   . Bulging lumbar disc   . Carpal tunnel syndrome, bilateral   . Conjunctivitis   . COPD (chronic obstructive pulmonary disease) (HCC)    BRONCHITIS  . Diabetes mellitus without complication ([place])    TYPE 2   DX  4-5 YRS AGO  . ETD (eustachian tube dysfunction)   . ETD (Eustachian tube dysfunction), bilateral    left worse than right  . Fibroids    uterine  . GAD (generalized anxiety disorder)   . GERD (gastroesophageal reflux disease)    TAKES PRESCRIPTION MEDS  . Glaucoma    BOTH EYES  . Hematoma    Hematoma of Left Hand- had to wear a split  . Hyperlipidemia   . Hypertension   . Hypothyroidism    NODULES ON THYROID  . PONV (postoperative nausea and vomiting)   . Pulmonary embolism ([place])   . Tachycardia   . TMJ (dislocation of  temporomandibular joint)   . Trigger finger   . Trigger finger of left thumb     Patient Active Problem List   Diagnosis Date Noted  . Pain in right ankle and joints of right foot 10/29/2017  . Chronic pain of right knee 10/29/2017  . Right elbow pain 10/29/2017  . Iron deficiency anemia due to chronic blood loss 07/07/2017  . S/P cervical spinal fusion 02/27/2017  . Pulmonary embolism ([place]) 11/24/2016  . Dysfunctional uterine bleeding 11/24/2016  . Anticoagulated 11/24/2016  . Microcytic anemia 11/24/2016  . Tachycardia   . Vaginal bleeding   . Allergic rhinoconjunctivitis 07/28/2015  . Chronic headache 07/28/2015  . PCOS (polycystic ovarian syndrome) 06/20/2015  . 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 Hyperlipidemia LDL goal <100 06/20/2015  . GAD (generalized anxiety disorder) 05/18/2014  . GERD (gastroesophageal reflux disease) 05/18/2014  . Body fluid retention 07/07/2013  . Chronic sinusitis 07/02/2013  . Extrinsic asthma 05/20/2013  . HTN (hypertension) 05/20/2013  . Prediabetes 05/20/2013    Past Surgical History:  Procedure Laterality Date  . ANTERIOR CERVICAL DECOMP/DISCECTOMY FUSION N/A 11/13/2016   Procedure: Cervical five-six, Cervical six-seven Anterior Cervical Discectomy and Fusion, Allograft, Plate;  Surgeon: Marybelle Killings, MD;  Location: [facility];  Service: Orthopedics;  Laterality: N/A;  . BREAST SURGERY    . CARPAL TUNNEL RELEASE    . DILATION AND CURETTAGE OF UTERUS    . EXPLORATORY LAPAROTOMY    . NASAL SINUS SURGERY  07/22/13   Dr. Redmond Pulling in [place]    OB History    Gravida Para Term Preterm AB Living   1 1   1   1    SAB TAB Ectopic Multiple Live Births                   Home Medications    Prior to Admission medications   Medication Sig Start Date End Date Taking? Authorizing Provider  albuterol (PROVENTIL) (2.5 MG/3ML) 0.083% nebulizer solution Take 2.5 mg by nebulization every 6 (six) hours as needed for wheezing or shortness of breath.     [provider]  ALPRAZolam Duanne Moron) 0.5 MG tablet Take 1 tablet (0.5 mg total) by mouth 3 (three) times daily as needed for anxiety. 12/05/17   Eustaquio Maize, MD  amoxicillin-clavulanate (AUGMENTIN) 875-125 MG tablet Take 1 tablet by mouth every 12 (twelve) hours. 12/09/17   Horton, Barbette Hair, MD  aspirin 81 MG chewable tablet Chew 81 mg by mouth daily.    [provider]  AUVI-Q 0.3 MG/0.3ML SOAJ injection Use as directed for life-threatening allergic reaction. 06/10/17   Kozlow, Donnamarie Poag, MD  cromolyn (OPTICROM) 4 % ophthalmic solution Place 1 drop into both eyes 4 (four) times daily.  03/20/17   [provider]  diclofenac (VOLTAREN) 0.1 % ophthalmic solution Place 1 drop into both eyes 3 (three) times daily.  04/11/17   [provider]  DULoxetine (CYMBALTA) 20 MG capsule Take 30 mg daily by mouth.     [provider]  fluticasone (FLONASE) 50 MCG/ACT nasal spray USE ONE SPRAY in BOTH nostrils TWICE DAILY 10/01/17   Kozlow, Donnamarie Poag, MD  furosemide (LASIX) 20 MG tablet Take 1 tablet (20 mg total) by mouth daily as needed for fluid. 09/15/17   Eustaquio Maize, MD  HYDROcodone-acetaminophen (NORCO/VICODIN) 5-325 MG tablet  10/21/17   [provider]  IRON PO Take by mouth. 09/11/17   [provider]  Lifitegrast Shirley Friar) 5 % SOLN Apply to eye 2 (two) times daily.     [provider]  linaclotide Rolan Lipa) 290 MCG CAPS capsule TAKE ONE CAPSULE BY MOUTH EVERY DAY BEFORE BREAKFAST 09/15/17   Eustaquio Maize, MD  losartan (COZAAR) 25 MG tablet Take 0.5-1 tablets (12.5-25 mg total) by mouth daily. 09/15/17   Eustaquio Maize, MD  medroxyPROGESTERone (PROVERA) 10 MG tablet  10/24/17   [provider]  Mepolizumab (NUCALA Dix) Inject into the skin every 28 (twenty-eight) days.    [provider]  metFORMIN (GLUCOPHAGE) 500 MG tablet TAKE ONE TABLET BY MOUTH DAILY WITH BREAKFAST 10/17/17   Eustaquio Maize, MD    montelukast (SINGULAIR) 10 MG tablet TAKE 1 TABLET BY MOUTH AT BEDTIME 10/01/17   Kozlow, Donnamarie Poag, MD  Olopatadine HCl 0.6 % SOLN Use two sprays in each nostril once or twice daily 10/30/17   Kozlow, Donnamarie Poag, MD  pantoprazole (PROTONIX) 40 MG tablet Take 1 tablet (40 mg total) by mouth daily. 09/15/17   Eustaquio Maize, MD  potassium chloride SA (K-DUR,KLOR-CON) 20 MEQ tablet TAKE TWO TABLETS BY MOUTH DAILY FOR 3 DAYS, THEN TAKE 1 TABLET BY MOUTH DAILY WHILE TAKING LASIX 11/05/17   Eustaquio Maize, MD  pravastatin (PRAVACHOL) 20 MG tablet Take 1  tablet (20 mg total) by mouth daily. 09/15/17   Eustaquio Maize, MD  PROAIR HFA 108 [phone] Base) MCG/ACT inhaler INHALE 1-2 PUFFS EVERY 4 HOURS AS NEEDED WHEEZING OR SHORTNESS OF BREATH 10/17/17   Eustaquio Maize, MD  SYMBICORT 160-4.5 MCG/ACT inhaler inhale TWO puffs TWICE DAILY 10/01/17   Kozlow, Donnamarie Poag, MD  triamcinolone cream (KENALOG) 0.1 % Apply 1 application 2 (two) times daily topically. 09/26/17   Eustaquio Maize, MD  VOLTAREN 1 % GEL APPLY 2G TOPICALLY FOUR TIMES DAILY 10/17/17   Eustaquio Maize, MD    Family History Family History  Problem Relation Age of Onset  . Hypertension Mother   . Hypertension Father     Social History Social History   Tobacco Use  . Smoking status: Former Smoker    Packs/day: 0.25    Years: 5.00    Pack years: 1.25    Types: Cigarettes    Last attempt to quit: 11/18/1989    Years since quitting: 28.0  . Smokeless tobacco: Never Used  Substance Use Topics  . Alcohol use: No  . Drug use: No     Allergies   Demerol [meperidine]; Morphine and related; Diltiazem hcl; Lisinopril; Lyrica [pregabalin]; and Tramadol   Review of Systems Review of Systems  Constitutional: Negative for fever.  HENT: Positive for congestion, ear pain and sore throat. Negative for trouble swallowing.   Respiratory: Positive for cough. Negative for shortness of breath.   Cardiovascular: Negative for chest pain.   Gastrointestinal: Negative for abdominal pain, nausea and vomiting.  Genitourinary: Positive for dysuria. Negative for difficulty urinating.  All other systems reviewed and are negative.    Physical Exam Updated Vital Signs BP 132/80 (BP Location: Right Arm)   Pulse 95   Temp 98 F (36.7 C) (Oral)   Resp 15   Ht 5\' 2"  (1.575 m)   Wt 68 kg (150 lb)   LMP 12/02/2017   SpO2 98%   BMI 27.44 kg/m   Physical Exam  Constitutional: She is oriented to person, place, and time. She appears well-developed and well-nourished. She does not appear ill.  HENT:  Head: Normocephalic and atraumatic.  Mouth/Throat: No tonsillar exudate.  Mucous membranes moist, mild erythematous posterior oropharynx, uvula midline, no tonsillar swelling or exudate noted, no asymmetry of the tonsils  Eyes: Pupils are equal, round, and reactive to light.  Neck: Neck supple.  Cardiovascular: Normal rate, regular rhythm and normal heart sounds.  Pulmonary/Chest: Effort normal. No respiratory distress. She has no wheezes.  Abdominal: Soft. Bowel sounds are normal.  Lymphadenopathy:    She has cervical adenopathy.  Neurological: She is alert and oriented to person, place, and time.  Skin: Skin is warm and dry.  Psychiatric: She has a normal mood and affect.  Nursing note and vitals reviewed.    ED Treatments / Results  Labs (all labs ordered are listed, but only abnormal results are displayed) Labs Reviewed  URINALYSIS, ROUTINE W REFLEX MICROSCOPIC - Abnormal; Notable for the following components:      Result Value   Hgb urine dipstick LARGE (*)    Bacteria, UA MANY (*)    Squamous Epithelial / LPF 0-5 (*)    All other components within normal limits  RAPID STREP SCREEN (NOT AT [facility])  CULTURE, GROUP A STREP [facility])  URINE CULTURE    EKG  EKG Interpretation None       Radiology No results found.  Procedures Procedures (including critical care time)  Medications Ordered in ED Medications -  No data to display   Initial Impression / Assessment and Plan / ED Course  I have reviewed the triage vital signs and the nursing notes.  Pertinent labs & imaging results that were available during my care of the patient were reviewed by me and considered in my medical decision making (see chart for details).     Patient presents with sore throat and urinary symptoms.  She is overall nontoxic appearing and vital signs are reassuring.  She does report additional upper respiratory symptoms.  Strep screen is negative.  Center criteria is 1/4.  Suspect viral etiology given additional symptoms.  She has urinary symptoms as well.  No flank pain or fever.  Urine has large hemoglobin, many bacteria, 0-5 white cells.  Urine culture was sent.  Given symptoms, will elect to treat.  Augmentin given to also cover for strep if culture comes back positive.  After history, exam, and medical workup I feel the patient has been appropriately medically screened and is safe for discharge home. Pertinent diagnoses were discussed with the patient. Patient was given return precautions.   Final Clinical Impressions(s) / ED Diagnoses   Final diagnoses:  Viral pharyngitis  Acute cystitis with hematuria    ED Discharge Orders        Ordered    amoxicillin-clavulanate (AUGMENTIN) 875-125 MG tablet  Every 12 hours     12/09/17 2341       Merryl Hacker, MD 12/09/17 2350

## 2017-12-09 NOTE — Progress Notes (Signed)
Provera

## 2017-12-09 NOTE — Discharge Instructions (Signed)
Were seen today for sore throat.  This is likely viral in nature.  However, your urine does look slightly infected.  Given your symptoms, you will be treated with antibiotics.  This would also treat strep throat.  Take as directed.  If you develop any new or worsening symptoms you should be reevaluated.

## 2017-12-10 NOTE — Progress Notes (Signed)
Subjective:    Patient ID: Jennifer Mullins, female    DOB: 1971-03-11, 46 y.o.   MRN: [id number]  HPI  Pt presents for treatment of menometrorrhagia.  Pt has been seen by Dr. Evie Lacks in [place] for many years.  She has had several work ups for abnml bleeding and 2 fibroids.  Most recently she had very heavy bleeding early 2018 while on blood thinners.  Pt was being treated for a PE after neck surgery.  After stopping blood thinners, bleeding improved.  Bleeding returned this fall.  Ellwood Handler put her on Provera mid December through January 9.  Afer stopping provera, she bled on January 15th and it was heavy.  t took one Provera on Jan 18th. Which helped bleeding.  Pt is spotting now.    Pt had a recent TVUS at Dr. Ritta Slot office.  I don't have official report.  Office note shows that there are two stable 2 cm fibroids.  There a probably clot in the endocervical canal but couldn't r/o polyp.  Pt was having very heavy bleeding at time of [place].  Pt has had endometrial biopsies in the past.  The last in 2018 which is negative.    Pt thinks she wants conservative treatment (no hysterectomy).  She thought she could have a D 7 C and removal of fibroids hysteroscopically.  However, the fibroids are not submucosal.  Pt considering ablation.  Pt does have pain with intercourse.  It is mostly posterior and shoots into her rectum.  BM can reproduce pain.  Pt thinks she could have endometriosis.   Review of Systems  Constitutional: Positive for unexpected weight change.  Respiratory: Negative for shortness of breath.   Cardiovascular: Negative.   Gastrointestinal: Positive for abdominal distention.  Genitourinary: Positive for dyspareunia, menstrual problem, pelvic pain and vaginal bleeding. Negative for vaginal discharge and vaginal pain.  Psychiatric/Behavioral: Negative.        Objective:   Physical Exam  Constitutional: She is oriented to person, place, and time. She appears well-developed and well-nourished.  No distress.  HENT:  Head: Normocephalic and atraumatic.  Eyes: Conjunctivae are normal.  Pulmonary/Chest: Effort normal.  Abdominal: Soft. Bowel sounds are normal. She exhibits no distension and no mass. There is no tenderness. There is no rebound and no guarding.  Genitourinary:  Genitourinary Comments: Tanner V Vulva:  No lesion Vagina:  Pink, nml rugae, small amount of brown blood Cervix:  No lesion Uterus:  Anterverted with mild prolapse, Some tenderness with bimanual, uterus is mobile, pain in posterior fornix,  Adnexa:  No masses, mild tenderness.  Musculoskeletal: She exhibits no edema.  Neurological: She is alert and oriented to person, place, and time.  Skin: Skin is warm and dry.  Psychiatric: She has a normal mood and affect.  Vitals reviewed.  Vitals:   12/09/17 1339  BP: 139/89  Pulse: (!) 106  Weight: 150 lb (68 kg)  Height: 5\' 2"  (1.575 m)    Assessment & Plan:  47 yo female presents with menometrorrhagia.  Pt also had pelvic pain and dyspareunia which was elicited through history taking and exam  1-Need last [place] from [place] 2-Once back, we need to discuss best option to address all her complaints--menomet, dyspareunia, and pelvic pain.  Pt thought she wanted an ablation but this would not address everything.  Further counseling needed.    45 minutes spent face to face with patient with greater than 505 counseling.  More counseling neede at future visit.

## 2017-12-11 ENCOUNTER — Ambulatory Visit: Payer: Medicaid Other | Admitting: Podiatry

## 2017-12-11 ENCOUNTER — Encounter: Payer: Self-pay | Admitting: Podiatry

## 2017-12-11 DIAGNOSIS — R102 Pelvic and perineal pain: Secondary | ICD-10-CM | POA: Insufficient documentation

## 2017-12-11 DIAGNOSIS — N941 Unspecified dyspareunia: Secondary | ICD-10-CM | POA: Insufficient documentation

## 2017-12-11 DIAGNOSIS — M722 Plantar fascial fibromatosis: Secondary | ICD-10-CM | POA: Diagnosis not present

## 2017-12-11 DIAGNOSIS — N921 Excessive and frequent menstruation with irregular cycle: Secondary | ICD-10-CM | POA: Insufficient documentation

## 2017-12-11 LAB — URINE CULTURE: Culture: NO GROWTH

## 2017-12-11 NOTE — Progress Notes (Signed)
She presents today for follow-up of pain to her right heel.  States that is still sore but not as bad as it was.  States that she spoke with Dr. Lorin Mercy about her knee after our discussion and he discussed with her in detail via phone messages and evaluation of her x-ray that she has some early osteoarthritic changes to the medial aspect of the right knee.  She states that it feels better if her foot is in good alignment and she is recently started using her orthotics once again.  Objective: Vital signs are stable she is alert and oriented x3 no change in her past medical history medications allergies surgery social history.  She has good strong palpable pulses right lower extremity pain on palpation medial calcaneal tubercle of the right heel.  Neurologic sensorium is intact deep tendon reflexes are intact muscle strength normal symmetrical.  Assessment: Plantar fasciitis of the right foot secondary to pronation which is more than likely causing some medial knee strain.  This is also a result of her osteoarthritis of the right knee.  Plan: Discussed etiology pathology conservative versus surgical therapies.  At this point we injected the right heel today with 20 mg Kenalog 5 mg Marcaine medial aspect of the foot after sterile Betadine skin prep she tolerated procedure well without complications.  She will follow-up with me in 6 weeks to 2 months after wearing these orthotics continuously.  She will follow-up with Dr. Lorin Mercy as well.

## 2017-12-12 ENCOUNTER — Telehealth: Payer: Self-pay | Admitting: Pediatrics

## 2017-12-12 DIAGNOSIS — R590 Localized enlarged lymph nodes: Secondary | ICD-10-CM | POA: Diagnosis not present

## 2017-12-12 DIAGNOSIS — E042 Nontoxic multinodular goiter: Secondary | ICD-10-CM | POA: Diagnosis not present

## 2017-12-12 DIAGNOSIS — Z6827 Body mass index (BMI) 27.0-27.9, adult: Secondary | ICD-10-CM | POA: Diagnosis not present

## 2017-12-12 LAB — CULTURE, GROUP A STREP (THRC)

## 2017-12-15 ENCOUNTER — Other Ambulatory Visit: Payer: Self-pay

## 2017-12-15 ENCOUNTER — Ambulatory Visit (INDEPENDENT_AMBULATORY_CARE_PROVIDER_SITE_OTHER): Payer: BLUE CROSS/BLUE SHIELD | Admitting: *Deleted

## 2017-12-15 ENCOUNTER — Other Ambulatory Visit: Payer: Self-pay | Admitting: Pediatrics

## 2017-12-15 DIAGNOSIS — J309 Allergic rhinitis, unspecified: Secondary | ICD-10-CM

## 2017-12-15 DIAGNOSIS — I10 Essential (primary) hypertension: Secondary | ICD-10-CM

## 2017-12-15 MED ORDER — LOSARTAN POTASSIUM 25 MG PO TABS: 12.5000 mg | ORAL_TABLET | Freq: Every day | ORAL | 0 refills | Status: DC

## 2017-12-17 ENCOUNTER — Telehealth: Payer: Self-pay | Admitting: Pediatrics

## 2017-12-17 ENCOUNTER — Ambulatory Visit: Payer: BLUE CROSS/BLUE SHIELD | Admitting: Pediatrics

## 2017-12-17 ENCOUNTER — Encounter: Payer: Self-pay | Admitting: Pediatrics

## 2017-12-17 VITALS — BP 130/86 | HR 81 | Temp 97.7°F | Ht 62.0 in | Wt 152.8 lb

## 2017-12-17 DIAGNOSIS — I10 Essential (primary) hypertension: Secondary | ICD-10-CM | POA: Diagnosis not present

## 2017-12-17 DIAGNOSIS — R7303 Prediabetes: Secondary | ICD-10-CM | POA: Diagnosis not present

## 2017-12-17 DIAGNOSIS — F411 Generalized anxiety disorder: Secondary | ICD-10-CM

## 2017-12-17 DIAGNOSIS — D649 Anemia, unspecified: Secondary | ICD-10-CM

## 2017-12-17 LAB — BAYER DCA HB A1C WAIVED: HB A1C (BAYER DCA - WAIVED): 5.7 % (ref <7.0)

## 2017-12-17 MED ORDER — ALPRAZOLAM 0.5 MG PO TABS: 0.5000 mg | ORAL_TABLET | Freq: Two times a day (BID) | ORAL | 1 refills | Status: DC | PRN

## 2017-12-17 MED ORDER — LOSARTAN POTASSIUM 50 MG PO TABS: 50.0000 mg | ORAL_TABLET | Freq: Every day | ORAL | 1 refills | Status: DC

## 2017-12-17 NOTE — Telephone Encounter (Signed)
lmtcb

## 2017-12-17 NOTE — Progress Notes (Signed)
  Subjective:   Patient ID: Jennifer Mullins, female    DOB: 1971/01/27, 47 y.o.   MRN: [id number] CC: Anxiety (tthree months recheck); Diabetes; and Anemia  HPI: Jennifer Mullins is a 47 y.o. female presenting for Anxiety (tthree months recheck); Diabetes; and Anemia  Asthma: not needing rescue albuterol for weeks, on nucala, following with allergy  Seen in ED 1 week ago for UTI symptoms, prescribed augmentin for UTI Symptoms improved, no fevers, appetite back to normal  Thyroid nodules: stable on u/s, per endocrine doesn't need f/u u/s unless there are changes  HTN: taking losartan 51m 1-2 times a day BPs at home 110s-140s at home Says 110s SBP from morning after she took second losartan pill at night when BP elevated Sometimes has a headache No CP  Anxiety: symptoms stable, decreasing xanax to BID from TID per pain management with concurrent narcotic Rx, [facility]  Trying to avoid sugary foods  Relevant past medical, surgical, family and social history reviewed. Allergies and medications reviewed and updated. Social History   Tobacco Use  Smoking Status Former Smoker  . Packs/day: 0.25  . Years: 5.00  . Pack years: 1.25  . Types: Cigarettes  . Last attempt to quit: 11/18/1989  . Years since quitting: 28.0  Smokeless Tobacco Never Used   ROS: Per HPI   Objective:    BP 130/86   Pulse 81   Temp 97.7 F (36.5 C) (Oral)   Ht _0  (1.575 m)   Wt 152 lb 12.8 oz (69.3 kg)   LMP 12/02/2017   BMI 27.95 kg/m   Wt Readings from Last 3 Encounters:  12/17/17 152 lb 12.8 oz (69.3 kg)  12/09/17 150 lb (68 kg)  12/09/17 150 lb (68 kg)    Gen: NAD, alert, cooperative with exam, NCAT EYES: EOMI, no conjunctival injection, or no icterus ENT:  TMs pearly gray b/l, OP without erythema LYMPH: no cervical LAD CV: NRRR, normal S1/S2, no murmur, distal pulses 2+ b/l Resp: CTABL, no wheezes, normal WOB Abd: +BS, soft, NTND. no guarding or organomegaly Ext: No  edema, warm Neuro: Alert and oriented, strength equal b/l UE and LE, coordination grossly normal MSK: normal muscle bulk  Assessment & Plan:  Jennifer Mullins seen today for anxiety, diabetes and anemia.  Diagnoses and all orders for this visit:  Anemia, unspecified type Recheck CBC -     CBC with Differential/Platelet  GAD (generalized anxiety disorder) Symptoms stable, decreasing to BID today -     ALPRAZolam (XANAX) 0.5 MG tablet; Take 1 tablet (0.5 mg total) by mouth 2 (two) times daily as needed for anxiety.  Essential hypertension Elevated numbers at home, rec take 557mevery morning Cont to check, let me know if elevated, any dizzy/lightheadedness -     CMP14+EGFR -     losartan (COZAAR) 50 MG tablet; Take 1 tablet (50 mg total) by mouth daily.  Prediabetes Discussed lifestyle changes -     Bayer DCA Hb A1c Waived   Follow up plan[place]

## 2017-12-17 NOTE — Telephone Encounter (Signed)
OK to send in fluconazole 150mg  x 1 if having yeast symptoms, pt on antibiotics now

## 2017-12-18 ENCOUNTER — Other Ambulatory Visit: Payer: Self-pay | Admitting: *Deleted

## 2017-12-18 DIAGNOSIS — M542 Cervicalgia: Secondary | ICD-10-CM | POA: Diagnosis not present

## 2017-12-18 DIAGNOSIS — E663 Overweight: Secondary | ICD-10-CM | POA: Diagnosis not present

## 2017-12-18 DIAGNOSIS — E559 Vitamin D deficiency, unspecified: Secondary | ICD-10-CM | POA: Diagnosis not present

## 2017-12-18 DIAGNOSIS — Z79899 Other long term (current) drug therapy: Secondary | ICD-10-CM | POA: Diagnosis not present

## 2017-12-18 LAB — CBC WITH DIFFERENTIAL/PLATELET
Basophils Absolute: 0 10*3/uL (ref 0.0–0.2)
Basos: 0 %
EOS (ABSOLUTE): 0 10*3/uL (ref 0.0–0.4)
Eos: 0 %
Hematocrit: 35.7 % (ref 34.0–46.6)
Hemoglobin: 12.3 g/dL (ref 11.1–15.9)
Immature Grans (Abs): 0 10*3/uL (ref 0.0–0.1)
Immature Granulocytes: 0 %
Lymphocytes Absolute: 1.9 10*3/uL (ref 0.7–3.1)
Lymphs: 32 %
MCH: 31.9 pg (ref 26.6–33.0)
MCHC: 34.5 g/dL (ref 31.5–35.7)
MCV: 93 fL (ref 79–97)
Monocytes Absolute: 0.3 10*3/uL (ref 0.1–0.9)
Monocytes: 5 %
Neutrophils Absolute: 3.7 10*3/uL (ref 1.4–7.0)
Neutrophils: 63 %
Platelets: 246 10*3/uL (ref 150–379)
RBC: 3.85 x10E6/uL (ref 3.77–5.28)
RDW: 13.5 % (ref 12.3–15.4)
WBC: 5.8 10*3/uL (ref 3.4–10.8)

## 2017-12-18 LAB — CMP14+EGFR
ALT: 12 IU/L (ref 0–32)
AST: 11 IU/L (ref 0–40)
Albumin/Globulin Ratio: 2 (ref 1.2–2.2)
Albumin: 4.3 g/dL (ref 3.5–5.5)
Alkaline Phosphatase: 85 IU/L (ref 39–117)
BUN/Creatinine Ratio: 15 (ref 9–23)
BUN: 10 mg/dL (ref 6–24)
Bilirubin Total: 0.2 mg/dL (ref 0.0–1.2)
CO2: 26 mmol/L (ref 20–29)
Calcium: 9.1 mg/dL (ref 8.7–10.2)
Chloride: 99 mmol/L (ref 96–106)
Creatinine, Ser: 0.65 mg/dL (ref 0.57–1.00)
GFR calc Af Amer: 121 mL/min/{1.73_m2} (ref >59)
GFR calc non Af Amer: 105 mL/min/{1.73_m2} (ref >59)
Globulin, Total: 2.2 g/dL (ref 1.5–4.5)
Glucose: 80 mg/dL (ref 65–99)
Potassium: 4.4 mmol/L (ref 3.5–5.2)
Sodium: 139 mmol/L (ref 134–144)
Total Protein: 6.5 g/dL (ref 6.0–8.5)

## 2017-12-18 MED ORDER — FLUCONAZOLE 150 MG PO TABS: 150.0000 mg | ORAL_TABLET | Freq: Once | ORAL | 0 refills | Status: AC

## 2017-12-18 MED ORDER — FLUCONAZOLE 150 MG PO TABS: 150.0000 mg | ORAL_TABLET | Freq: Once | ORAL | 0 refills | Status: DC

## 2017-12-18 NOTE — Telephone Encounter (Signed)
Was she fasting yesterday? Only ok to add on lipid panel if fasting. She needs to take 50mg  of losartan every morning, can be two 25mg  tabs at same time or 50mg  losartan that was sent in yesterday. Medication should be $3.

## 2017-12-18 NOTE — Telephone Encounter (Signed)
Pt was fasting - I will add the lipid test in the lab --- she is aware of the BP meds

## 2017-12-19 ENCOUNTER — Other Ambulatory Visit: Payer: Self-pay | Admitting: Pediatrics

## 2017-12-19 ENCOUNTER — Encounter: Payer: Self-pay | Admitting: Pediatrics

## 2017-12-19 DIAGNOSIS — M545 Low back pain: Secondary | ICD-10-CM | POA: Diagnosis not present

## 2017-12-19 DIAGNOSIS — L820 Inflamed seborrheic keratosis: Secondary | ICD-10-CM | POA: Diagnosis not present

## 2017-12-19 DIAGNOSIS — B379 Candidiasis, unspecified: Secondary | ICD-10-CM

## 2017-12-19 DIAGNOSIS — M79602 Pain in left arm: Secondary | ICD-10-CM | POA: Diagnosis not present

## 2017-12-19 DIAGNOSIS — L918 Other hypertrophic disorders of the skin: Secondary | ICD-10-CM | POA: Diagnosis not present

## 2017-12-19 DIAGNOSIS — L700 Acne vulgaris: Secondary | ICD-10-CM | POA: Diagnosis not present

## 2017-12-19 DIAGNOSIS — L689 Hypertrichosis, unspecified: Secondary | ICD-10-CM | POA: Diagnosis not present

## 2017-12-19 DIAGNOSIS — M542 Cervicalgia: Secondary | ICD-10-CM | POA: Diagnosis not present

## 2017-12-19 DIAGNOSIS — M79601 Pain in right arm: Secondary | ICD-10-CM | POA: Diagnosis not present

## 2017-12-19 LAB — LIPID PANEL
Chol/HDL Ratio: 3.1 ratio (ref 0.0–4.4)
Cholesterol, Total: 168 mg/dL (ref 100–199)
HDL: 55 mg/dL (ref >39)
LDL Calculated: 95 mg/dL (ref 0–99)
Triglycerides: 89 mg/dL (ref 0–149)
VLDL Cholesterol Cal: 18 mg/dL (ref 5–40)

## 2017-12-19 LAB — SPECIMEN STATUS REPORT

## 2017-12-19 MED ORDER — FLUCONAZOLE 150 MG PO TABS: 150.0000 mg | ORAL_TABLET | ORAL | 0 refills | Status: DC | PRN

## 2017-12-22 DIAGNOSIS — M542 Cervicalgia: Secondary | ICD-10-CM | POA: Diagnosis not present

## 2017-12-22 DIAGNOSIS — J4550 Severe persistent asthma, uncomplicated: Secondary | ICD-10-CM | POA: Diagnosis not present

## 2017-12-22 DIAGNOSIS — M545 Low back pain: Secondary | ICD-10-CM | POA: Diagnosis not present

## 2017-12-22 DIAGNOSIS — M79601 Pain in right arm: Secondary | ICD-10-CM | POA: Diagnosis not present

## 2017-12-23 ENCOUNTER — Ambulatory Visit (INDEPENDENT_AMBULATORY_CARE_PROVIDER_SITE_OTHER): Payer: BLUE CROSS/BLUE SHIELD | Admitting: *Deleted

## 2017-12-23 DIAGNOSIS — J4550 Severe persistent asthma, uncomplicated: Secondary | ICD-10-CM

## 2017-12-23 MED ORDER — MEPOLIZUMAB 100 MG ~~LOC~~ SOLR
100.0000 mg | SUBCUTANEOUS | Status: DC
  Administered 2017-12-23 – 2018-05-18 (×6): 100 mg via SUBCUTANEOUS

## 2017-12-24 DIAGNOSIS — M545 Low back pain: Secondary | ICD-10-CM | POA: Diagnosis not present

## 2017-12-24 DIAGNOSIS — M542 Cervicalgia: Secondary | ICD-10-CM | POA: Diagnosis not present

## 2017-12-24 DIAGNOSIS — M79601 Pain in right arm: Secondary | ICD-10-CM | POA: Diagnosis not present

## 2017-12-26 ENCOUNTER — Ambulatory Visit (INDEPENDENT_AMBULATORY_CARE_PROVIDER_SITE_OTHER): Payer: BLUE CROSS/BLUE SHIELD

## 2017-12-26 DIAGNOSIS — J309 Allergic rhinitis, unspecified: Secondary | ICD-10-CM

## 2017-12-29 DIAGNOSIS — R0602 Shortness of breath: Secondary | ICD-10-CM | POA: Diagnosis not present

## 2017-12-29 DIAGNOSIS — R635 Abnormal weight gain: Secondary | ICD-10-CM | POA: Diagnosis not present

## 2017-12-29 DIAGNOSIS — E7800 Pure hypercholesterolemia, unspecified: Secondary | ICD-10-CM | POA: Diagnosis not present

## 2017-12-29 DIAGNOSIS — R5383 Other fatigue: Secondary | ICD-10-CM | POA: Diagnosis not present

## 2017-12-31 ENCOUNTER — Ambulatory Visit: Payer: BLUE CROSS/BLUE SHIELD | Admitting: "Endocrinology

## 2017-12-31 ENCOUNTER — Emergency Department (HOSPITAL_COMMUNITY)
Admission: EM | Admit: 2017-12-31 | Discharge: 2018-01-01 | Disposition: A | Discharge status: Home / Self Care | Payer: BLUE CROSS/BLUE SHIELD | Attending: Emergency Medicine | Admitting: Emergency Medicine

## 2017-12-31 ENCOUNTER — Telehealth: Payer: Self-pay | Admitting: *Deleted

## 2017-12-31 ENCOUNTER — Ambulatory Visit (INDEPENDENT_AMBULATORY_CARE_PROVIDER_SITE_OTHER): Payer: BLUE CROSS/BLUE SHIELD

## 2017-12-31 ENCOUNTER — Other Ambulatory Visit: Payer: Self-pay

## 2017-12-31 ENCOUNTER — Encounter (HOSPITAL_COMMUNITY): Payer: Self-pay | Admitting: Emergency Medicine

## 2017-12-31 DIAGNOSIS — J449 Chronic obstructive pulmonary disease, unspecified: Secondary | ICD-10-CM | POA: Diagnosis not present

## 2017-12-31 DIAGNOSIS — R490 Dysphonia: Secondary | ICD-10-CM | POA: Diagnosis not present

## 2017-12-31 DIAGNOSIS — I10 Essential (primary) hypertension: Secondary | ICD-10-CM | POA: Insufficient documentation

## 2017-12-31 DIAGNOSIS — T50Z95A Adverse effect of other vaccines and biological substances, initial encounter: Secondary | ICD-10-CM | POA: Diagnosis not present

## 2017-12-31 DIAGNOSIS — J309 Allergic rhinitis, unspecified: Secondary | ICD-10-CM

## 2017-12-31 DIAGNOSIS — J329 Chronic sinusitis, unspecified: Secondary | ICD-10-CM | POA: Diagnosis not present

## 2017-12-31 DIAGNOSIS — R51 Headache: Secondary | ICD-10-CM | POA: Diagnosis not present

## 2017-12-31 DIAGNOSIS — E119 Type 2 diabetes mellitus without complications: Secondary | ICD-10-CM | POA: Insufficient documentation

## 2017-12-31 DIAGNOSIS — F419 Anxiety disorder, unspecified: Secondary | ICD-10-CM | POA: Insufficient documentation

## 2017-12-31 DIAGNOSIS — Z7984 Long term (current) use of oral hypoglycemic drugs: Secondary | ICD-10-CM | POA: Insufficient documentation

## 2017-12-31 DIAGNOSIS — E039 Hypothyroidism, unspecified: Secondary | ICD-10-CM | POA: Diagnosis not present

## 2017-12-31 DIAGNOSIS — J45909 Unspecified asthma, uncomplicated: Secondary | ICD-10-CM | POA: Insufficient documentation

## 2017-12-31 DIAGNOSIS — Z87891 Personal history of nicotine dependence: Secondary | ICD-10-CM | POA: Insufficient documentation

## 2017-12-31 DIAGNOSIS — Z79899 Other long term (current) drug therapy: Secondary | ICD-10-CM | POA: Insufficient documentation

## 2017-12-31 DIAGNOSIS — T7840XA Allergy, unspecified, initial encounter: Secondary | ICD-10-CM | POA: Insufficient documentation

## 2017-12-31 DIAGNOSIS — L299 Pruritus, unspecified: Secondary | ICD-10-CM | POA: Diagnosis not present

## 2017-12-31 DIAGNOSIS — D251 Intramural leiomyoma of uterus: Secondary | ICD-10-CM

## 2017-12-31 DIAGNOSIS — R21 Rash and other nonspecific skin eruption: Secondary | ICD-10-CM | POA: Diagnosis not present

## 2017-12-31 LAB — CBG MONITORING, ED: Glucose-Capillary: 109 mg/dL — ABNORMAL HIGH (ref 65–99)

## 2017-12-31 MED ORDER — ACETAMINOPHEN 325 MG PO TABS
650.0000 mg | ORAL_TABLET | Freq: Once | ORAL | Status: AC
  Administered 2018-01-01: 650 mg via ORAL
  Filled 2017-12-31: qty 2

## 2017-12-31 MED ORDER — PREDNISONE 20 MG PO TABS
40.0000 mg | ORAL_TABLET | Freq: Once | ORAL | Status: AC
  Administered 2017-12-31: 40 mg via ORAL
  Filled 2017-12-31: qty 2

## 2017-12-31 MED ORDER — EPINEPHRINE 0.3 MG/0.3ML IJ SOAJ
0.3000 mg | Freq: Once | INTRAMUSCULAR | Status: AC
  Administered 2017-12-31: 0.3 mg via INTRAMUSCULAR
  Filled 2017-12-31: qty 0.3

## 2017-12-31 NOTE — Telephone Encounter (Signed)
Records received from previous TVU and Dr Gala Romney has reviewed.  Per Dr Leggett's recommendations pt to have an updated TVU and a F/U appt with her.  U/S scheduled for 01/06/18 3:00 [facility] and appt with Dr Gala Romney 01/14/18 @ 10:30

## 2017-12-31 NOTE — ED Triage Notes (Signed)
Pt states she had allergy shots today and now is suffering from hives and states her throat feels like it is closing.

## 2017-12-31 NOTE — ED Provider Notes (Addendum)
[facility] EMERGENCY DEPARTMENT Provider Note   CSN: [id number] Arrival date & time: 12/31/17  2224     History   Chief Complaint Chief Complaint  Patient presents with  . Allergic Reaction    HPI Jennifer Mullins is a 47 y.o. female.  Patient developed hoarseness and pruritic rash on her arms 8:30 AM this morning after having allergy shots.  The rash is at the sites where she received allergy shots she denies any shortness of breath.  Denies other complaint.  Treated with Benadryl.  Nothing makes symptoms better or worse no other associated symptoms.  HPI  Past Medical History:  Diagnosis Date  . Anemia   . Anemia   . Anxiety   . Arthritis   . Asthma   . Bilateral calcaneal spurs   . Bilateral polycystic ovarian syndrome   . Bulging lumbar disc   . Carpal tunnel syndrome, bilateral   . Conjunctivitis   . COPD (chronic obstructive pulmonary disease) (HCC)    BRONCHITIS  . Diabetes mellitus without complication ([place])    TYPE 2   DX  4-5 YRS AGO  . ETD (eustachian tube dysfunction)   . ETD (Eustachian tube dysfunction), bilateral    left worse than right  . Fibroids    uterine  . GAD (generalized anxiety disorder)   . GERD (gastroesophageal reflux disease)    TAKES PRESCRIPTION MEDS  . Glaucoma    BOTH EYES  . Hematoma    Hematoma of Left Hand- had to wear a split  . Hyperlipidemia   . Hypertension   . Hypothyroidism    NODULES ON THYROID  . PONV (postoperative nausea and vomiting)   . Pulmonary embolism ([place])   . Tachycardia   . TMJ (dislocation of temporomandibular joint)   . Trigger finger   . Trigger finger of left thumb     Patient Active Problem List   Diagnosis Date Noted  . Pelvic pain in female 12/11/2017  . Dyspareunia, female 12/11/2017  . Menometrorrhagia 12/11/2017  . Pain in right ankle and joints of right foot 10/29/2017  . Chronic pain of right knee 10/29/2017  . Right elbow pain 10/29/2017  . Iron deficiency anemia due to chronic  blood loss 07/07/2017  . S/P cervical spinal fusion 02/27/2017  . Pulmonary embolism ([place]) 11/24/2016  . Dysfunctional uterine bleeding 11/24/2016  . Anticoagulated 11/24/2016  . Microcytic anemia 11/24/2016  . Tachycardia   . Vaginal bleeding   . Allergic rhinoconjunctivitis 07/28/2015  . Chronic headache 07/28/2015  . PCOS (polycystic ovarian syndrome) 06/20/2015  . 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 Hyperlipidemia LDL goal <100 06/20/2015  . GAD (generalized anxiety disorder) 05/18/2014  . GERD (gastroesophageal reflux disease) 05/18/2014  . Body fluid retention 07/07/2013  . Chronic sinusitis 07/02/2013  . Extrinsic asthma 05/20/2013  . HTN (hypertension) 05/20/2013  . Prediabetes 05/20/2013    Past Surgical History:  Procedure Laterality Date  . ANTERIOR CERVICAL DECOMP/DISCECTOMY FUSION N/A 11/13/2016   Procedure: Cervical five-six, Cervical six-seven Anterior Cervical Discectomy and Fusion, Allograft, Plate;  Surgeon: Marybelle Killings, MD;  Location: [facility];  Service: Orthopedics;  Laterality: N/A;  . BREAST SURGERY    . CARPAL TUNNEL RELEASE    . DILATION AND CURETTAGE OF UTERUS    . EXPLORATORY LAPAROTOMY    . NASAL SINUS SURGERY  07/22/13   Dr. Redmond Pulling in [place]    OB History    Gravida Para Term Preterm AB Living   1 1  1   1   SAB TAB Ectopic Multiple Live Births                   Home Medications    Prior to Admission medications   Medication Sig Start Date End Date Taking? Authorizing Provider  diphenhydrAMINE (BENADRYL) 25 MG tablet Take 12.5-25 mg by mouth every 6 (six) hours as needed for allergies.   Yes [provider]  albuterol (PROVENTIL) (2.5 MG/3ML) 0.083% nebulizer solution Take 2.5 mg by nebulization every 6 (six) hours as needed for wheezing or shortness of breath.    [provider]  Albuterol Sulfate 108 (90 Base) MCG/ACT AEPB INHALE 1 TO 2 PUFFS EVERY FOUR HOURS AS NEEDED WHEEZING SHORTNESS OF BREATH 06/20/17   [provider]  ALPRAZolam Duanne Moron) 0.5 MG tablet Take 1 tablet (0.5 mg total) by mouth 2 (two) times daily as needed for anxiety. 12/17/17   Eustaquio Maize, MD  amoxicillin-clavulanate (AUGMENTIN) 875-125 MG tablet Take 1 tablet by mouth every 12 (twelve) hours. 12/09/17   Horton, Barbette Hair, MD  aspirin 81 MG chewable tablet Chew 81 mg by mouth daily.    [provider]  AUVI-Q 0.3 MG/0.3ML SOAJ injection Use as directed for life-threatening allergic reaction. 06/10/17   Kozlow, Donnamarie Poag, MD  cromolyn (OPTICROM) 4 % ophthalmic solution Place 1 drop into both eyes 4 (four) times daily.  03/20/17   [provider]  diclofenac (VOLTAREN) 0.1 % ophthalmic solution Place 1 drop into both eyes 3 (three) times daily.  04/11/17   [provider]  DULoxetine (CYMBALTA) 30 MG capsule Take 30 mg by mouth daily. 09/18/17   [provider]  fluconazole (DIFLUCAN) 150 MG tablet Take 1 tablet (150 mg total) by mouth every 3 (three) days as needed. 12/19/17   Eustaquio Maize, MD  fluticasone (FLONASE) 50 MCG/ACT nasal spray USE ONE SPRAY in BOTH nostrils TWICE DAILY Patient not taking: Reported on 12/17/2017 10/01/17   Kozlow, Donnamarie Poag, MD  furosemide (LASIX) 20 MG tablet Take 1 tablet (20 mg total) by mouth daily as needed for fluid. 09/15/17   Eustaquio Maize, MD  HYDROcodone-acetaminophen (NORCO/VICODIN) 5-325 MG tablet  10/21/17   [provider]  IRON PO Take by mouth. Take when hemoglobin is low 09/11/17   [provider]  iron polysaccharides (NIFEREX) 150 MG capsule Take by mouth. 10/10/17   [provider]  Lifitegrast Shirley Friar) 5 % SOLN Apply to eye 2 (two) times daily.     [provider]  linaclotide Rolan Lipa) 290 MCG CAPS capsule TAKE ONE CAPSULE BY MOUTH EVERY DAY BEFORE BREAKFAST 09/15/17   Eustaquio Maize, MD  losartan (COZAAR) 50 MG tablet Take 1 tablet (50 mg total) by mouth daily. 12/17/17   Eustaquio Maize, MD  medroxyPROGESTERone  (PROVERA) 10 MG tablet Take as needed for heavy periods 10/24/17   [provider]  Mepolizumab (NUCALA [place]) Inject into the skin every 28 (twenty-eight) days.    [provider]  metFORMIN (GLUCOPHAGE) 500 MG tablet TAKE ONE TABLET BY MOUTH DAILY WITH BREAKFAST 10/17/17   Eustaquio Maize, MD  montelukast (SINGULAIR) 10 MG tablet TAKE 1 TABLET BY MOUTH AT BEDTIME 10/01/17   Kozlow, Donnamarie Poag, MD  Olopatadine HCl 0.6 % SOLN Use two sprays in each nostril once or twice daily 10/30/17   Kozlow, Donnamarie Poag, MD  pantoprazole (PROTONIX) 40 MG tablet Take 1 tablet (40 mg total) by mouth daily. 09/15/17  Eustaquio Maize, MD  potassium chloride SA (K-DUR,KLOR-CON) 20 MEQ tablet TAKE TWO TABLETS BY MOUTH DAILY FOR 3 DAYS, THEN TAKE 1 TABLET BY MOUTH DAILY WHILE TAKING LASIX 12/15/17   Eustaquio Maize, MD  pravastatin (PRAVACHOL) 20 MG tablet Take 1 tablet (20 mg total) by mouth daily. 09/15/17   Eustaquio Maize, MD  PROAIR HFA 108 [phone] Base) MCG/ACT inhaler INHALE 1-2 PUFFS EVERY 4 HOURS AS NEEDED WHEEZING OR SHORTNESS OF BREATH 10/17/17   Eustaquio Maize, MD  SYMBICORT 160-4.5 MCG/ACT inhaler inhale TWO puffs TWICE DAILY 10/01/17   Kozlow, Donnamarie Poag, MD  VOLTAREN 1 % GEL APPLY 2G TOPICALLY FOUR TIMES DAILY 10/17/17   Eustaquio Maize, MD    Family History Family History  Problem Relation Age of Onset  . Hypertension Mother   . Hypertension Father     Social History Social History   Tobacco Use  . Smoking status: Former Smoker    Packs/day: 0.25    Years: 5.00    Pack years: 1.25    Types: Cigarettes    Last attempt to quit: 11/18/1989    Years since quitting: 28.1  . Smokeless tobacco: Never Used  Substance Use Topics  . Alcohol use: No  . Drug use: No     Allergies   Demerol [meperidine]; Morphine and related; Diltiazem hcl; Lisinopril; Lyrica [pregabalin]; and Tramadol   Review of Systems Review of Systems  Constitutional: Negative.   HENT: Positive for sinus pressure  and voice change.   Respiratory: Negative.   Cardiovascular: Negative.   Gastrointestinal: Negative.   Musculoskeletal: Negative.   Skin: Positive for rash.  Neurological: Negative.   Psychiatric/Behavioral: Negative.   All other systems reviewed and are negative.    Physical Exam Updated Vital Signs BP 110/73 (BP Location: Right Arm)   Pulse 85   Temp 98.4 F (36.9 C) (Oral)   Resp 18   Ht 5\' 2"  (1.575 m)   Wt 68.9 kg (152 lb)   LMP 12/02/2017   SpO2 99%   BMI 27.80 kg/m   Physical Exam  Constitutional: She appears well-developed and well-nourished.  HENT:  Head: Normocephalic and atraumatic.  Mouth/Throat: Oropharynx is clear and moist.  Swelling of tongue lips or uvula.  No mucosal lesion voice mildly hoarse  Eyes: Conjunctivae are normal. Pupils are equal, round, and reactive to light.  Neck: Neck supple. No tracheal deviation present. No thyromegaly present.  Cardiovascular: Normal rate, regular rhythm and normal heart sounds.  No murmur heard. Pulmonary/Chest: Effort normal and breath sounds normal.  Abdominal: Soft. Bowel sounds are normal. She exhibits no distension. There is no tenderness.  Musculoskeletal: Normal range of motion. She exhibits no edema or tenderness.  Neurological: She is alert. Coordination normal.  Skin: Skin is warm and dry. Capillary refill takes less than 2 seconds. Rash noted.  Mildly erythematous hive-like rash on bilateral arms only.  Psychiatric: She has a normal mood and affect.  Nursing note and vitals reviewed.    ED Treatments / Results  Labs (all labs ordered are listed, but only abnormal results are displayed) Labs Reviewed - No data to display  EKG  EKG Interpretation None      Results for orders placed or performed during the hospital encounter of 12/31/17  CBG monitoring, ED  Result Value Ref Range   Glucose-Capillary 109 (H) 65 - 99 mg/dL   No results found.  Radiology No results  found.  Procedures Procedures (including critical care time)  Medications Ordered in ED Medications  EPINEPHrine (EPI-PEN) injection 0.3 mg (not administered)     Initial Impression / Assessment and Plan / ED Course  I have reviewed the triage vital signs and the nursing notes.  Pertinent labs & imaging results that were available during my care of the patient were reviewed by me and considered in my medical decision making (see chart for details).     11:35 PM she states hoarseness is improved after treatment with EpiPen intramuscularly and oral prednisone.  She now mentions sinus congestion at bilateral maxillary areas for the past 2 days and is requesting CAT scan.  I have her explained to her that  does not require CT scan and sinus headache for 2 days is treated symptomatically.  Tylenol ordered.  Patient signed out to Dr. Roxanne Mins 24:49 PM  Final Clinical Impressions(s) / ED Diagnoses   #1 allergic reaction #2 sinus headache Final diagnoses:  None    ED Discharge Orders    None       Orlie Dakin, MD 12/31/17 2352    Orlie Dakin, MD 12/31/17 [place], [place], MD 12/31/17 [phone]

## 2017-12-31 NOTE — Progress Notes (Signed)
Patient was unable to wait entire 30 minutes due to another appointment for hydrotherapy. I advised her that she should refrain from strenuous activity and avoid pools/spas/hot tubs for a minimum of 2 hours post injection. I also let her know that she is required to have her epinephrine auto-injector to every immunotherapy visit. I did let her know that this is all in accordance with out office's immunotherapy protocol. I gave a note stating this so that she could take it to the physical therapy office to show as proof.

## 2018-01-01 ENCOUNTER — Telehealth: Payer: Self-pay | Admitting: Pediatrics

## 2018-01-01 ENCOUNTER — Telehealth: Payer: Self-pay | Admitting: *Deleted

## 2018-01-01 MED ORDER — AUVI-Q 0.3 MG/0.3ML IJ SOAJ: INTRAMUSCULAR | 3 refills | Status: DC

## 2018-01-01 MED ORDER — LORAZEPAM 1 MG PO TABS: 1.0000 mg | ORAL_TABLET | Freq: Four times a day (QID) | ORAL | 0 refills | Status: DC | PRN

## 2018-01-01 MED ORDER — PREDNISONE 50 MG PO TABS: 50.0000 mg | ORAL_TABLET | Freq: Every day | ORAL | 0 refills | Status: DC

## 2018-01-01 MED ORDER — LORAZEPAM 1 MG PO TABS
1.0000 mg | ORAL_TABLET | Freq: Once | ORAL | Status: AC
  Administered 2018-01-01: 1 mg via ORAL
  Filled 2018-01-01: qty 1

## 2018-01-01 NOTE — Telephone Encounter (Signed)
  FYI   I gave the patient her injection yesterday. She did not advise of headache or any other symptoms. She said that she had forgotten her Epipen at home and had just taken diphenhydramine because this is the only thing that she can take. She did have a very minute local reaction in her left arm. For further information please see documentation in the immunotherapy flowsheet.

## 2018-01-01 NOTE — ED Notes (Signed)
  Patient signed out to me by Dr. Iran Ouch been treated for allergic reaction with injection of epinephrine and oral prednisone.  She had severe breakthrough bleeding and was given a dose of lorazepam with marked subjective improvement.  She is discharged with prescriptions for EpiPen, prednisone, lorazepam.  Advised to use diphenhydramine as her primary medication for itching and reserve lorazepam for itching not relieved by diphenhydramine.  Return precautions discussed.   Delora Fuel, MD 83/81/84 [phone]

## 2018-01-01 NOTE — Discharge Instructions (Signed)
Take diphenhydramine (Benadryl) as needed for itching. Take lorazepam for itching not relieved by diphenhydramine.  If you ever have to use your epipen, come to the hospital immediately!

## 2018-01-01 NOTE — Telephone Encounter (Signed)
I have informed the patient of all of this information. She did take a cetirizine this morning and feels she may be a touch better. She will let us know how she feels on Monday.

## 2018-01-01 NOTE — Telephone Encounter (Signed)
Did we relay this message?  Please inform patient that we sometimes do see the large local reaction and other reactions directed against immunotherapy if an individual is having a infection.  It is quite possible that she is going to be blossom into a respiratory tract infection over the course of the next few days and we will just need to see if indeed that does occur.  There is no reason for her to take prednisone beyond a single dose and I would not recommend that she use a multi day administration of prednisone for her reaction.  If she does have the development of a respiratory tract infection in the next few days then we will probably just repeat the dose when she gets over that infection.  If she does not develop a respiratory tract infection then we will decrease her dose by 50% and workup once again using standard acceleration schedule.

## 2018-01-01 NOTE — Telephone Encounter (Signed)
Please inform patient that we sometimes do see the large local reaction and other reactions directed against immunotherapy if an individual is having a infection.  It is quite possible that she is going to be blossom into a respiratory tract infection over the course of the next few days and we will just need to see if indeed that does occur.  There is no reason for her to take prednisone beyond a single dose and I would not recommend that she use a multi day administration of prednisone for her reaction.  If she does have the development of a respiratory tract infection in the next few days then we will probably just repeat the dose when she gets over that infection.  If she does not develop a respiratory tract infection then we will decrease her dose by 50% and workup once again using standard acceleration schedule.

## 2018-01-01 NOTE — Telephone Encounter (Signed)
Patient called stating that after her injection yesterday her arms swelled up really big. She took a Benadryl and felt some relief but as the evening went on she started having an itchy throat and went to [facility] where she received epi and given a prescription for Prednisone. She states that she told nurses that she had a headache yesterday morning before injections and yesterday evening she started having nasal congestion. Patient was give 0.15 of Red vial on both arms. Please advise on how to proceed?

## 2018-01-02 DIAGNOSIS — M545 Low back pain: Secondary | ICD-10-CM | POA: Diagnosis not present

## 2018-01-02 DIAGNOSIS — M79602 Pain in left arm: Secondary | ICD-10-CM | POA: Diagnosis not present

## 2018-01-02 DIAGNOSIS — M79601 Pain in right arm: Secondary | ICD-10-CM | POA: Diagnosis not present

## 2018-01-02 DIAGNOSIS — M542 Cervicalgia: Secondary | ICD-10-CM | POA: Diagnosis not present

## 2018-01-06 ENCOUNTER — Ambulatory Visit (HOSPITAL_COMMUNITY)
Admission: RE | Admit: 2018-01-06 | Discharge: 2018-01-06 | Disposition: A | Discharge status: Home / Self Care | Payer: BLUE CROSS/BLUE SHIELD | Source: Ambulatory Visit | Attending: Obstetrics & Gynecology | Admitting: Obstetrics & Gynecology

## 2018-01-06 ENCOUNTER — Telehealth: Payer: Self-pay | Admitting: *Deleted

## 2018-01-06 ENCOUNTER — Ambulatory Visit (INDEPENDENT_AMBULATORY_CARE_PROVIDER_SITE_OTHER): Payer: BLUE CROSS/BLUE SHIELD | Admitting: *Deleted

## 2018-01-06 DIAGNOSIS — D251 Intramural leiomyoma of uterus: Secondary | ICD-10-CM

## 2018-01-06 DIAGNOSIS — J309 Allergic rhinitis, unspecified: Secondary | ICD-10-CM | POA: Diagnosis not present

## 2018-01-06 DIAGNOSIS — R9389 Abnormal findings on diagnostic imaging of other specified body structures: Secondary | ICD-10-CM | POA: Insufficient documentation

## 2018-01-06 NOTE — Telephone Encounter (Signed)
Patient came in today for her injection. She stated that she did not develop a cold after last injection so I deceased her previous dose by 50%. Patient was also given lorazepam at the ER for anxiety and itching and I informed her that is not something we typically prescribe but she would like to know if you want her to continue to take it prior to her injections? Please advise. Also patient has stopped her nasal spray per her eye doctor because he was concerned they might be the cause in her ear pressure going up.

## 2018-01-07 DIAGNOSIS — M79602 Pain in left arm: Secondary | ICD-10-CM | POA: Diagnosis not present

## 2018-01-07 DIAGNOSIS — M545 Low back pain: Secondary | ICD-10-CM | POA: Diagnosis not present

## 2018-01-07 DIAGNOSIS — M542 Cervicalgia: Secondary | ICD-10-CM | POA: Diagnosis not present

## 2018-01-07 DIAGNOSIS — M79601 Pain in right arm: Secondary | ICD-10-CM | POA: Diagnosis not present

## 2018-01-07 NOTE — Telephone Encounter (Signed)
Spoke to patient states she is going to be taking lorazepam only the day of allergy injections. Will also take cetirizine 24 hour tablet.  Advised patient multiple times that we do not prescribe medication here and would have to call pcp to obtain prescription. Patient verbalized understanding and will call pcp.

## 2018-01-07 NOTE — Telephone Encounter (Signed)
Please inform patient that if she feels she needs to use xanax for anxiety that would be fine to use. We do not prescribe these medications.

## 2018-01-08 ENCOUNTER — Other Ambulatory Visit: Payer: Self-pay | Admitting: Pediatrics

## 2018-01-08 ENCOUNTER — Telehealth: Payer: Self-pay

## 2018-01-08 NOTE — Telephone Encounter (Signed)
Spoke with pt and she is aware that Dr.Leggett recommends endometrial bx. Appt scheduled for 2/27 with Dr.Leggett

## 2018-01-09 DIAGNOSIS — M542 Cervicalgia: Secondary | ICD-10-CM | POA: Diagnosis not present

## 2018-01-09 DIAGNOSIS — M79602 Pain in left arm: Secondary | ICD-10-CM | POA: Diagnosis not present

## 2018-01-09 DIAGNOSIS — M79601 Pain in right arm: Secondary | ICD-10-CM | POA: Diagnosis not present

## 2018-01-09 DIAGNOSIS — M545 Low back pain: Secondary | ICD-10-CM | POA: Diagnosis not present

## 2018-01-10 NOTE — Telephone Encounter (Signed)
Returned patient's phone call.  Patient states that she is needing a refill on lorazepam originally prescribed in the ED.  Patient states that she only takes them once a week when getting allergy shots.

## 2018-01-10 NOTE — Telephone Encounter (Signed)
Please call pt back about her rx for ALPRAZolam (XANAX) 0.5 MG tablet. She stated that she called yesterday and that her rx would be ready at the pharmacy but has not been called in.

## 2018-01-11 ENCOUNTER — Other Ambulatory Visit: Payer: Self-pay | Admitting: Pediatrics

## 2018-01-11 DIAGNOSIS — M797 Fibromyalgia: Secondary | ICD-10-CM

## 2018-01-11 NOTE — Telephone Encounter (Signed)
She may take one of the two xanax she is prescribed a day before the allergy shot as long as she is not driving as it may cause sleepiness. I cannot write her for both lorazepam and xanax/alprazolam as they are the same kind of medicine.

## 2018-01-12 ENCOUNTER — Other Ambulatory Visit: Payer: Self-pay | Admitting: *Deleted

## 2018-01-12 ENCOUNTER — Ambulatory Visit (INDEPENDENT_AMBULATORY_CARE_PROVIDER_SITE_OTHER): Payer: BLUE CROSS/BLUE SHIELD

## 2018-01-12 DIAGNOSIS — J309 Allergic rhinitis, unspecified: Secondary | ICD-10-CM

## 2018-01-12 MED ORDER — DULOXETINE HCL 30 MG PO CPEP: 30.0000 mg | ORAL_CAPSULE | Freq: Every day | ORAL | 0 refills | Status: DC

## 2018-01-12 NOTE — Telephone Encounter (Signed)
Patient aware.

## 2018-01-14 ENCOUNTER — Ambulatory Visit: Payer: BLUE CROSS/BLUE SHIELD | Admitting: Obstetrics & Gynecology

## 2018-01-14 ENCOUNTER — Encounter: Payer: Self-pay | Admitting: Obstetrics & Gynecology

## 2018-01-14 VITALS — BP 119/69 | HR 75 | Resp 16 | Ht 62.0 in | Wt 152.0 lb

## 2018-01-14 DIAGNOSIS — R309 Painful micturition, unspecified: Secondary | ICD-10-CM

## 2018-01-15 NOTE — Progress Notes (Signed)
   Subjective:    Patient ID: Jennifer Mullins, female    DOB: 1971/09/16, 47 y.o.   MRN: [id number]  HPI  Pt here for endometrial biopsy for thickened endometrium.  Pt's bleeding has been better the past 2 months.  Pt has been having unprotected intercourse and would deire a pregnancy if one occurred.    Review of Systems  Constitutional: Negative.   Gastrointestinal: Negative.   Genitourinary: Positive for dyspareunia and menstrual problem.       Objective:   Physical Exam  Constitutional: She is oriented to person, place, and time. She appears well-developed and well-nourished. No distress.  HENT:  Head: Normocephalic and atraumatic.  Eyes: Conjunctivae are normal.  Cardiovascular: Normal rate.  Pulmonary/Chest: Effort normal.  Abdominal: Soft. Bowel sounds are normal. She exhibits no distension.  Musculoskeletal: She exhibits no edema.  Neurological: She is alert and oriented to person, place, and time.  Skin: Skin is warm and dry.  Psychiatric: She has a normal mood and affect.  Vitals reviewed.  Vitals:   01/14/18 1027  BP: 119/69  Pulse: 75  Resp: 16  Weight: 152 lb (68.9 kg)  Height: 5\' 2"  (1.575 m)     Assessment & Plan:  47 yo female with menorrhagia needed endometrial biopsy.  1.  Abstain or have only protected intercourse for 2 weeks.   2. RTC for biospy.  15 minutes spent face to face with patient with >50% counseling about procedures and possible upcoming treatments.

## 2018-01-16 LAB — CULTURE, URINE COMPREHENSIVE
MICRO NUMBER:: 90256324
SPECIMEN QUALITY:: ADEQUATE

## 2018-01-19 ENCOUNTER — Ambulatory Visit (INDEPENDENT_AMBULATORY_CARE_PROVIDER_SITE_OTHER): Payer: Medicaid Other | Admitting: *Deleted

## 2018-01-19 ENCOUNTER — Ambulatory Visit (INDEPENDENT_AMBULATORY_CARE_PROVIDER_SITE_OTHER): Payer: Medicaid Other | Admitting: Otolaryngology

## 2018-01-19 DIAGNOSIS — J343 Hypertrophy of nasal turbinates: Secondary | ICD-10-CM

## 2018-01-19 DIAGNOSIS — J3089 Other allergic rhinitis: Secondary | ICD-10-CM | POA: Diagnosis not present

## 2018-01-19 DIAGNOSIS — J310 Chronic rhinitis: Secondary | ICD-10-CM | POA: Diagnosis not present

## 2018-01-19 DIAGNOSIS — J4550 Severe persistent asthma, uncomplicated: Secondary | ICD-10-CM | POA: Diagnosis not present

## 2018-01-20 ENCOUNTER — Other Ambulatory Visit (INDEPENDENT_AMBULATORY_CARE_PROVIDER_SITE_OTHER): Payer: Self-pay | Admitting: Otolaryngology

## 2018-01-20 ENCOUNTER — Ambulatory Visit (HOSPITAL_COMMUNITY)
Admission: RE | Admit: 2018-01-20 | Discharge: 2018-01-20 | Disposition: A | Discharge status: Home / Self Care | Payer: Medicaid Other | Source: Ambulatory Visit | Attending: Otolaryngology | Admitting: Otolaryngology

## 2018-01-20 DIAGNOSIS — J329 Chronic sinusitis, unspecified: Secondary | ICD-10-CM | POA: Diagnosis present

## 2018-01-21 ENCOUNTER — Ambulatory Visit: Payer: BLUE CROSS/BLUE SHIELD | Admitting: Pediatrics

## 2018-01-21 ENCOUNTER — Encounter: Payer: Self-pay | Admitting: Pediatrics

## 2018-01-21 DIAGNOSIS — F411 Generalized anxiety disorder: Secondary | ICD-10-CM | POA: Diagnosis not present

## 2018-01-21 DIAGNOSIS — I10 Essential (primary) hypertension: Secondary | ICD-10-CM

## 2018-01-21 MED ORDER — LOSARTAN POTASSIUM 25 MG PO TABS: 50.0000 mg | ORAL_TABLET | Freq: Every day | ORAL | 3 refills | Status: DC

## 2018-01-21 MED ORDER — ALPRAZOLAM 0.5 MG PO TABS: ORAL_TABLET | ORAL | 2 refills | Status: DC

## 2018-01-21 NOTE — Progress Notes (Signed)
  Subjective:   Patient ID: Jennifer Mullins, female    DOB: [birth year], 47 y.o.   MRN: [id number] CC: Discuss Medications  HPI: Jennifer Mullins is a 47 y.o. female presenting for Discuss Medications  Here today with boyfriend  Seen in the ED for allergic reaction after regularly scheduled weekly allergy shots. Was prescribed #10 tabs lorazepam in ED to take with future allergy shots. Pt says she has 3 tabs left. She gets very nervous before allergy shots and then starts itching. Currently taking alprazolam BID every day for anxiety. This was decreased from TID as pt now following with [facility] for pain management and taking hydrocodone three times a day. Pt asks for refill on lorazepam.  Now taking cetirizine the night before allergy shots to help with reaction. Following with allergy.  HTN: has had am bps ranging 110s, 120s in the morning, sometimes down to 106 sbp. Felt slightly lighteded with lower bp. Taking losartan 50mg  in the morning regularly.  Seen by ENT earlier this week. Prescribed abx for sinus infection, says she hasnt picked up yet, not sure what she is starting.  Relevant past medical, surgical, family and social history reviewed. Allergies and medications reviewed and updated. Social History   Tobacco Use  Smoking Status Former Smoker  . Packs/day: 0.25  . Years: 5.00  . Pack years: 1.25  . Types: Cigarettes  . Last attempt to quit: 11/18/1989  . Years since quitting: 28.1  Smokeless Tobacco Never Used   ROS: Per HPI   Objective:    BP 114/80   Pulse 79   Temp (!) 97.3 F (36.3 C) (Oral)   Ht 5\' 2"  (1.575 m)   Wt 154 lb (69.9 kg)   LMP 12/31/2017   BMI 28.17 kg/m   Wt Readings from Last 3 Encounters:  01/21/18 154 lb (69.9 kg)  01/14/18 152 lb (68.9 kg)  12/31/17 152 lb (68.9 kg)    Gen: NAD, alert, cooperative with exam, NCAT EYES: EOMI, no conjunctival injection, or no icterus ENT:  TMs pearly gray b/l, OP without erythema LYMPH: no cervical  LAD CV: NRRR, normal S1/S2, no murmur, distal pulses 2+ b/l Resp: CTABL, no wheezes, normal WOB Abd: +BS, soft, NTND. no guarding or organomegaly Ext: No edema, warm Neuro: Alert and oriented  Assessment & Plan:  Kemoni was seen today for discuss medications.  Diagnoses and all orders for this visit:  GAD (generalized anxiety disorder) OK to take alprazolam 0.5mg  tab before allergy shot as long as she is not driving. D/w pt this will not help stop allergic reaction. She says it did help with itching that she thinks was partly nerves. Next months Rx will be for #64 tabs so she will have extra xanax once a week for allergy shots. Has 3 tabs left of ativan for this month. Again discussed risk of being on both benzodiazepine medication and narcotic medication. Do not take at the same time. Both can cause sleepiness. Do not take and drive.  -     ALPRAZolam (XANAX) 0.5 MG tablet; Take 1 tab twice a day as needed. May take one more tab before allergy shots once a week.  Essential hypertension Some lightheadedness. Cont to check Bps at home. Decrease to losartan 25mg  in the morning   Follow up plan: Return in about 3 months (around 04/23/2018). [name], MD [place]

## 2018-01-23 ENCOUNTER — Encounter: Payer: Self-pay | Admitting: *Deleted

## 2018-01-23 DIAGNOSIS — J301 Allergic rhinitis due to pollen: Secondary | ICD-10-CM | POA: Diagnosis not present

## 2018-01-23 NOTE — Progress Notes (Signed)
Maintenance vial made. Exp: 01-24-19. hv

## 2018-01-26 ENCOUNTER — Other Ambulatory Visit: Payer: Self-pay

## 2018-01-26 ENCOUNTER — Other Ambulatory Visit: Payer: Self-pay | Admitting: Pulmonary Disease

## 2018-01-26 ENCOUNTER — Other Ambulatory Visit (INDEPENDENT_AMBULATORY_CARE_PROVIDER_SITE_OTHER): Payer: Self-pay | Admitting: Otolaryngology

## 2018-01-26 DIAGNOSIS — J3089 Other allergic rhinitis: Secondary | ICD-10-CM | POA: Diagnosis not present

## 2018-01-27 ENCOUNTER — Ambulatory Visit: Payer: Medicaid Other | Admitting: Podiatry

## 2018-01-28 ENCOUNTER — Other Ambulatory Visit: Payer: Self-pay | Admitting: Pediatrics

## 2018-01-28 DIAGNOSIS — R609 Edema, unspecified: Secondary | ICD-10-CM

## 2018-01-29 ENCOUNTER — Ambulatory Visit (INDEPENDENT_AMBULATORY_CARE_PROVIDER_SITE_OTHER): Payer: Medicaid Other | Admitting: Obstetrics & Gynecology

## 2018-01-29 ENCOUNTER — Ambulatory Visit: Payer: Medicaid Other | Admitting: Obstetrics & Gynecology

## 2018-01-29 ENCOUNTER — Other Ambulatory Visit (HOSPITAL_COMMUNITY)
Admission: RE | Admit: 2018-01-29 | Discharge: 2018-01-29 | Disposition: A | Discharge status: Home / Self Care | Payer: Medicaid Other | Source: Ambulatory Visit | Attending: Obstetrics & Gynecology | Admitting: Obstetrics & Gynecology

## 2018-01-29 ENCOUNTER — Encounter: Payer: Self-pay | Admitting: Obstetrics & Gynecology

## 2018-01-29 VITALS — BP 129/76 | HR 107 | Resp 16 | Ht 62.0 in | Wt 152.0 lb

## 2018-01-29 DIAGNOSIS — Z3202 Encounter for pregnancy test, result negative: Secondary | ICD-10-CM | POA: Diagnosis not present

## 2018-01-29 DIAGNOSIS — N938 Other specified abnormal uterine and vaginal bleeding: Secondary | ICD-10-CM

## 2018-01-29 LAB — POCT URINE PREGNANCY: Preg Test, Ur: NEGATIVE

## 2018-01-29 NOTE — Progress Notes (Signed)
   Subjective:    Patient ID: Jennifer Mullins, female    DOB: [birth year], 47 y.o.   MRN: [id number]  HPI  Pt thinks she was pregnant and miscarried Sunday.  Pt passed a big clot then had spotting.  Very heavy on Tuesday.    Review of Systems  Constitutional: Negative.   Respiratory: Negative.   Cardiovascular: Negative.   Gastrointestinal: Negative.   Genitourinary: Positive for dyspareunia, pelvic pain, vaginal bleeding and vaginal pain.  Psychiatric/Behavioral: Negative.        Objective:   Physical Exam  Constitutional: She is oriented to person, place, and time. She appears well-developed and well-nourished. No distress.  HENT:  Head: Normocephalic and atraumatic.  Eyes: Conjunctivae are normal.  Pulmonary/Chest: Effort normal.  Abdominal: Soft. Bowel sounds are normal.  Mild tenderness diffusely.  No rebound or guarding.  Genitourinary:  Genitourinary Comments: Tanner V Vagina:  Pink, nml rugaue, scan blood Cervix:  No lesion,  Uterus:  Modbile, mild tenderness Posterior cul de sac:  Very painful and nodularity felt.    Musculoskeletal: She exhibits no edema.  Neurological: She is alert and oriented to person, place, and time.  Skin: Skin is warm and dry.  Psychiatric: She has a normal mood and affect.  Vitals reviewed.     Assessment & Plan:  47 yo female with menorrhagia and pelvic pain.  1-Pt's menorrhagia better since December.  Endometrial biopsy done today as endometirium is thickened.  Pt interested in endometrial ablation.   2-Pt might have endometriosis given symptoms.  Pt not interested in hysterectomy.  She would like endometriosis addressed with laser or sharp dissection.  Will refer to Dr. Elonda Husky for operative referral.  ENDOMETRIAL BIOPSY     The indications for endometrial biopsy were reviewed.   Risks of the biopsy including cramping, bleeding, infection, uterine perforation, inadequate specimen and need for additional procedures  were discussed. The  patient states she understands and agrees to undergo procedure today. Consent was signed. Time out was performed. Urine HCG was negative. A sterile speculum was placed in the patient's vagina and the cervix was prepped with Betadine. A single-toothed tenaculum was placed on the anterior lip of the cervix to stabilize it. The 3 mm pipelle was introduced into the endometrial cavity without difficulty to a depth of 8 cm, and a moderate amount of tissue was obtained and sent to pathology. The instruments were removed from the patient's vagina. Minimal bleeding from the cervix was noted. The patient tolerated the procedure well. Routine post-procedure instructions were given to the patient. The patient will follow up to review the results and for further management.    25 minutes spent face to face with patient with >50% counseling regarding pathogenesis of her pain & bleeding as well as treatment options that would address all her symptoms.

## 2018-01-30 ENCOUNTER — Ambulatory Visit (INDEPENDENT_AMBULATORY_CARE_PROVIDER_SITE_OTHER): Payer: Medicaid Other

## 2018-01-30 DIAGNOSIS — J309 Allergic rhinitis, unspecified: Secondary | ICD-10-CM

## 2018-02-01 ENCOUNTER — Other Ambulatory Visit: Payer: Self-pay | Admitting: Pediatrics

## 2018-02-02 ENCOUNTER — Telehealth: Payer: Self-pay | Admitting: *Deleted

## 2018-02-02 NOTE — Telephone Encounter (Signed)
-----   Message from Guss Bunde, MD sent at 02/02/2018 11:48 AM EDT ----- Normal endometrial biopsy.  Pt is to have appointment with Dr. Elonda Husky to discuss surgery.

## 2018-02-02 NOTE — Telephone Encounter (Signed)
Pt notified of normal EBX.  An appt was made for 02/12/18 @ 12:30 with Dr Elonda Husky @ [facility].

## 2018-02-05 ENCOUNTER — Other Ambulatory Visit: Payer: Self-pay | Admitting: Pediatrics

## 2018-02-05 ENCOUNTER — Ambulatory Visit (INDEPENDENT_AMBULATORY_CARE_PROVIDER_SITE_OTHER): Payer: Medicaid Other

## 2018-02-05 DIAGNOSIS — J309 Allergic rhinitis, unspecified: Secondary | ICD-10-CM

## 2018-02-09 ENCOUNTER — Other Ambulatory Visit: Payer: Self-pay | Admitting: Pediatrics

## 2018-02-09 NOTE — Telephone Encounter (Signed)
Needs to be seen for controlled substance refill

## 2018-02-09 NOTE — Telephone Encounter (Signed)
Pt is not taking this anymore. She follows now at a pain clinic. She will need to talk with pain specialist about her pain medicine refill.

## 2018-02-10 ENCOUNTER — Encounter: Payer: Self-pay | Admitting: Allergy and Immunology

## 2018-02-10 ENCOUNTER — Ambulatory Visit: Payer: Self-pay | Admitting: *Deleted

## 2018-02-10 ENCOUNTER — Ambulatory Visit (INDEPENDENT_AMBULATORY_CARE_PROVIDER_SITE_OTHER): Payer: Medicaid Other | Admitting: Allergy and Immunology

## 2018-02-10 VITALS — BP 118/76 | HR 88 | Resp 20

## 2018-02-10 DIAGNOSIS — H1045 Other chronic allergic conjunctivitis: Secondary | ICD-10-CM

## 2018-02-10 DIAGNOSIS — J3089 Other allergic rhinitis: Secondary | ICD-10-CM | POA: Diagnosis not present

## 2018-02-10 DIAGNOSIS — J309 Allergic rhinitis, unspecified: Secondary | ICD-10-CM

## 2018-02-10 DIAGNOSIS — J4550 Severe persistent asthma, uncomplicated: Secondary | ICD-10-CM | POA: Diagnosis not present

## 2018-02-10 DIAGNOSIS — K219 Gastro-esophageal reflux disease without esophagitis: Secondary | ICD-10-CM

## 2018-02-10 DIAGNOSIS — R51 Headache: Secondary | ICD-10-CM

## 2018-02-10 DIAGNOSIS — R519 Headache, unspecified: Secondary | ICD-10-CM

## 2018-02-10 DIAGNOSIS — H1013 Acute atopic conjunctivitis, bilateral: Secondary | ICD-10-CM

## 2018-02-10 NOTE — Progress Notes (Signed)
Follow-up Note  Referring Provider: Eustaquio Maize, MD Primary Provider: Eustaquio Maize, MD Date of Office Visit: 02/10/2018  Subjective:   Jennifer Mullins (DOB: 10-07-1971) is a 47 y.o. female who returns to the [facility] [place] on 02/10/2018 in re-evaluation of the following:  HPI: Jennifer Mullins returns to this clinic in reevaluation of her asthma and allergic rhinitis and allergic rhinoconjunctivitis with possible component of vernal conjunctivitis and LPR and headache disorder.  Her last visit to this clinic was 28 October 2017.  She is undergoing a course of aeroallergen immunotherapy and has been doing quite well with this form of treatment but sometime towards the tail end of February she developed a very large local reaction within approximately 2 hours of her immunotherapy and then developed a whisper voice and hoarseness around 6 hours after her immunotherapy and went to the hospital and was treated with Benadryl and epinephrine and discharged after a few hours.  She had no other associated systemic or constitutional symptoms.  Since then she has received immunotherapy without incident.  Her asthma is under excellent control and rarely does she use a short acting bronchodilator and she is tapered down her Symbicort 50% and continues on NUCALA.  She apparently contracted a episode of sinusitis evaluated by Dr. Benjamine Mola with a sinus CT scan which identified some mucosal abnormalities and she was given Augmentin for 7 days and is doing better at this point in time.  She did discontinue her Flonase for approximately 8 weeks upon the suggestion of her ophthalmologist because there was the question of whether or not nasal steroids was giving rise to elevated eye pressure.  She is now using her Flonase about every other day and apparently her eye pressure is doing well.  She has not had significant conjunctival irritation since the spring has started.  Her reflux is under very good  control at this point in time.  Her headaches are under excellent control at this point time.  She does not consume any caffeine.    Allergies as of 02/10/2018      Reactions   Demerol [meperidine] Shortness Of Breath   FLUSHING AND SHORTNESS OF BREATH   Morphine And Related Shortness Of Breath   Flushed and hot hyper Flushed and hot hyper Flushed and hot hyper Flushed and hot hyper   Diltiazem Hcl Hives   Lisinopril    Caused cough   Lyrica [pregabalin]    Chest pain, elevated heart rate   Tramadol Other (See Comments)   Tears up stomach      Medication List      albuterol (2.5 MG/3ML) 0.083% nebulizer solution Commonly known as:  PROVENTIL Take 2.5 mg by nebulization every 6 (six) hours as needed for wheezing or shortness of breath.   PROAIR HFA 108 (90 Base) MCG/ACT inhaler Generic drug:  albuterol INHALE 1-2 PUFFS EVERY 4 HOURS AS NEEDED WHEEZING OR SHORTNESS OF BREATH   ALPRAZolam 0.5 MG tablet Commonly known as:  XANAX Take 1 tab twice a day as needed. May take one more tab before allergy shots once a week.   aspirin 81 MG chewable tablet Chew 81 mg by mouth daily.   AUVI-Q 0.3 mg/0.3 mL Soaj injection Generic drug:  EPINEPHrine Use as directed for life-threatening allergic reaction.   cetirizine 10 MG tablet Commonly known as:  ZYRTEC Take 10 mg by mouth daily.   cromolyn 4 % ophthalmic solution Commonly known as:  OPTICROM Place 1 drop into  both eyes 4 (four) times daily.   cycloSPORINE 0.05 % ophthalmic emulsion Commonly known as:  RESTASIS Place 1 drop into both eyes 2 (two) times daily.   diclofenac 0.1 % ophthalmic solution Commonly known as:  VOLTAREN Place 1 drop into both eyes 3 (three) times daily.   diphenhydrAMINE 25 MG tablet Commonly known as:  BENADRYL Take 12.5-25 mg by mouth every 6 (six) hours as needed for allergies.   DULoxetine 30 MG capsule Commonly known as:  CYMBALTA TAKE ONE CAPSULE BY MOUTH EVERY DAY   EPIDUO 0.1-2.5  % gel Generic drug:  Adapalene-Benzoyl Peroxide AFFECTED AREA(S) ON FACE AND CHEST EVERY NIGHT AT BEDTIME   fluticasone 50 MCG/ACT nasal spray Commonly known as:  FLONASE Place 1 spray into both nostrils daily as needed for allergies or rhinitis.   furosemide 20 MG tablet Commonly known as:  LASIX TAKE ONE TABLET BY MOUTH AS NEEDED for fluid   HYDROcodone-acetaminophen 5-325 MG tablet Commonly known as:  NORCO/VICODIN Take 1 tablet by mouth every 6 (six) hours as needed for moderate pain or severe pain.   IRON PO Take 1 tablet by mouth daily as needed (for low hemoglobin levels). Take when hemoglobin is low   linaclotide 290 MCG Caps capsule Commonly known as:  LINZESS TAKE ONE CAPSULE BY MOUTH EVERY DAY BEFORE BREAKFAST   LORazepam 1 MG tablet Commonly known as:  ATIVAN Take 1 tablet (1 mg total) by mouth every 6 (six) hours as needed for anxiety (or itching).   losartan 25 MG tablet Commonly known as:  COZAAR Take 2 tablets (50 mg total) by mouth daily.   medroxyPROGESTERone 10 MG tablet Commonly known as:  PROVERA Take 10 mg by mouth daily.   metFORMIN 500 MG tablet Commonly known as:  GLUCOPHAGE TAKE ONE TABLET BY MOUTH DAILY WITH BREAKFAST   montelukast 10 MG tablet Commonly known as:  SINGULAIR TAKE 1 TABLET BY MOUTH AT BEDTIME   NUCALA [place] Inject into the skin every 28 (twenty-eight) days.   Olopatadine HCl 0.6 % Soln Use two sprays in each nostril once or twice daily   pantoprazole 40 MG tablet Commonly known as:  PROTONIX Take 1 tablet (40 mg total) by mouth daily.   potassium chloride SA 20 MEQ tablet Commonly known as:  K-DUR,KLOR-CON TAKE TWO TABLETS BY MOUTH DAILY FOR 3 DAYS, THEN TAKE 1 TABLET BY MOUTH DAILY WHILE TAKING LASIX   pravastatin 20 MG tablet Commonly known as:  PRAVACHOL Take 1 tablet (20 mg total) by mouth daily.   SYMBICORT 160-4.5 MCG/ACT inhaler Generic drug:  budesonide-formoterol inhale TWO puffs TWICE DAILY   VANIQA 13.9  % cream Generic drug:  Eflornithine HCl APPLY TO AFFECTED AREA TWICE DAILY   VOLTAREN 1 % Gel Generic drug:  diclofenac sodium APPLY 2G TOPICALLY FOUR TIMES DAILY       Past Medical History:  Diagnosis Date  . Anemia   . Anemia   . Anxiety   . Arthritis   . Asthma   . Bilateral calcaneal spurs   . Bilateral polycystic ovarian syndrome   . Bulging lumbar disc   . Carpal tunnel syndrome, bilateral   . Conjunctivitis   . COPD (chronic obstructive pulmonary disease) (HCC)    BRONCHITIS  . Diabetes mellitus without complication ([place])    TYPE 2   DX  4-5 YRS AGO  . ETD (eustachian tube dysfunction)   . ETD (Eustachian tube dysfunction), bilateral    left worse than right  . Fibroids  uterine  . GAD (generalized anxiety disorder)   . GERD (gastroesophageal reflux disease)    TAKES PRESCRIPTION MEDS  . Glaucoma    BOTH EYES  . Hematoma    Hematoma of Left Hand- had to wear a split  . Hyperlipidemia   . Hypertension   . Hypothyroidism    NODULES ON THYROID  . PONV (postoperative nausea and vomiting)   . Pulmonary embolism ([place])   . Tachycardia   . TMJ (dislocation of temporomandibular joint)   . Trigger finger   . Trigger finger of left thumb     Past Surgical History:  Procedure Laterality Date  . ANTERIOR CERVICAL DECOMP/DISCECTOMY FUSION N/A 11/13/2016   Procedure: Cervical five-six, Cervical six-seven Anterior Cervical Discectomy and Fusion, Allograft, Plate;  Surgeon: Marybelle Killings, MD;  Location: [facility];  Service: Orthopedics;  Laterality: N/A;  . BREAST SURGERY    . CARPAL TUNNEL RELEASE    . DILATION AND CURETTAGE OF UTERUS    . EXPLORATORY LAPAROTOMY    . NASAL SINUS SURGERY  07/22/13   Dr. Redmond Pulling in [place]    Review of systems negative except as noted in HPI / PMHx or noted below:  Review of Systems  Constitutional: Negative.   HENT: Negative.   Eyes: Negative.   Respiratory: Negative.   Cardiovascular: Negative.   Gastrointestinal: Negative.     Genitourinary: Negative.   Musculoskeletal: Negative.   Skin: Negative.   Neurological: Negative.   Endo/Heme/Allergies: Negative.   Psychiatric/Behavioral: Negative.      Objective:   Vitals:   02/10/18 0859  BP: 118/76  Pulse: 88  Resp: 20          Physical Exam  Constitutional: She is well-developed, well-nourished, and in no distress.  HENT:  Head: Normocephalic.  Right Ear: Tympanic membrane, external ear and ear canal normal.  Left Ear: Tympanic membrane, external ear and ear canal normal.  Nose: Nose normal. No mucosal edema or rhinorrhea.  Mouth/Throat: Uvula is midline, oropharynx is clear and moist and mucous membranes are normal. No oropharyngeal exudate.  Eyes: Conjunctivae are normal.  Neck: Trachea normal. No tracheal tenderness present. No tracheal deviation present. No thyromegaly present.  Cardiovascular: Normal rate, regular rhythm, S1 normal, S2 normal and normal heart sounds.  No murmur heard. Pulmonary/Chest: Breath sounds normal. No stridor. No respiratory distress. She has no wheezes. She has no rales.  Musculoskeletal: She exhibits no edema.  Lymphadenopathy:       Head (right side): No tonsillar adenopathy present.       Head (left side): No tonsillar adenopathy present.    She has no cervical adenopathy.  Neurological: She is alert. Gait normal.  Skin: No rash noted. She is not diaphoretic. No erythema. Nails show no clubbing.  Psychiatric: Mood and affect normal.    Diagnostics:    Spirometry was performed and demonstrated an FEV1 of 2.25 at 88 % of predicted.  The patient had an Asthma Control Test with the following results: ACT Total Score: 16.    Results of a sinus CT scan obtained 20 January 2018 identified the following:  Frontal: Small air-fluid level right frontal sinus otherwise clear Ethmoid: Mild mucosal edema ethmoid sinuses bilaterally. Maxillary: Mild mucosal edema in the maxillary sinus bilaterally. No air-fluid  level. Sphenoid: Mild mucosal edema bilaterally. Mastoid sinus: Well aerated and clear Right ostiomeatal unit: Patent. Left ostiomeatal unit: Moderate narrowing due to mucosal edema Nasal passages: Patent. Intact nasal septum is midline. Anatomy: Concha bullosa left middle  turbinate with air-fluid level. Other: Limited intracranial imaging negative. Facial soft tissues intact.  Assessment and Plan:   1. Asthma, severe persistent, well-controlled   2. Other allergic rhinitis   3. Perennial allergic conjunctivitis of both eyes   4. LPRD (laryngopharyngeal reflux disease)   5. Headache disorder     1. Continue Immunotherapy and Epi-Pen / Auvi-Q  2. Continue to Treat and prevent inflammation:   A. Symbicort 160 - 1-2 inhalations twice a day  B. Flonase - one spray each nostril 3 times a week  C. montelukast - one tablet one time per day  D. Nucala injections every 4 weeks  3. Continue to Treat and prevent reflux:   A. pantoprazole 40 mg in a.m.  4. If needed:   A. Patanase 2 sprays each nostril two times per day  B. OTC antihistamine  C. Proventil HFA or similar 2 inhalations every 4-6 hours  5. Continue eye therapy with Dr. Delman Cheadle.  6. Return in 12 weeks or earlier if problem  Sonyia appears to be doing relatively well on her current therapy which includes a large collection of anti-inflammatory agents for her respiratory tract including NUCALA and administration of a PPI for her reflux.  Her eye condition is being handled by Dr. Delman Cheadle at this point in time.  She will continue on immunotherapy and NUCALA and the plan mentioned above and I will assume that she will do well over the next 12 weeks as we go through the spring time season and I will see her back in this clinic at that point in time or earlier if there is a problem.  Allena Katz, MD Allergy / [facility] [place]

## 2018-02-10 NOTE — Patient Instructions (Addendum)
  1. Continue Immunotherapy and Epi-Pen / Auvi-Q  2. Continue to Treat and prevent inflammation:   A. Symbicort 160 - 1-2 inhalations twice a day  B. Flonase - one spray each nostril 3 times a week  C. montelukast - one tablet one time per day  D. Nucala injections every 4 weeks  3. Continue to Treat and prevent reflux:   A. pantoprazole 40 mg in a.m.  4. If needed:   A. Patanase 2 sprays each nostril two times per day  B. OTC antihistamine  C. Proventil HFA or similar 2 inhalations every 4-6 hours  5. Continue eye therapy with Dr. Delman Cheadle.  6. Return in 12 weeks or earlier if problem

## 2018-02-11 ENCOUNTER — Encounter: Payer: Self-pay | Admitting: Allergy and Immunology

## 2018-02-12 ENCOUNTER — Encounter: Payer: BLUE CROSS/BLUE SHIELD | Admitting: Obstetrics & Gynecology

## 2018-02-12 ENCOUNTER — Encounter: Payer: Self-pay | Admitting: Obstetrics & Gynecology

## 2018-02-12 ENCOUNTER — Ambulatory Visit: Payer: BLUE CROSS/BLUE SHIELD | Admitting: Obstetrics & Gynecology

## 2018-02-12 VITALS — BP 112/60 | HR 88 | Ht 62.0 in | Wt 156.0 lb

## 2018-02-12 DIAGNOSIS — N938 Other specified abnormal uterine and vaginal bleeding: Secondary | ICD-10-CM

## 2018-02-12 DIAGNOSIS — N941 Unspecified dyspareunia: Secondary | ICD-10-CM | POA: Diagnosis not present

## 2018-02-12 DIAGNOSIS — N921 Excessive and frequent menstruation with irregular cycle: Secondary | ICD-10-CM

## 2018-02-12 MED ORDER — MEDROXYPROGESTERONE ACETATE 10 MG PO TABS: 10.0000 mg | ORAL_TABLET | Freq: Every day | ORAL | 11 refills | Status: DC

## 2018-02-12 NOTE — Progress Notes (Signed)
Chief Complaint  Patient presents with  . Menorrhagia    Saw Dr. Gala Romney had endo bx      47 y.o. G1P0101 No LMP recorded. The current method of family planning is none.  Outpatient Encounter Medications as of 02/12/2018  Medication Sig Note  . albuterol (PROVENTIL) (2.5 MG/3ML) 0.083% nebulizer solution Take 2.5 mg by nebulization every 6 (six) hours as needed for wheezing or shortness of breath. 12/17/2017: Not used since August 2018.  Marland Kitchen ALPRAZolam (XANAX) 0.5 MG tablet Take 1 tab twice a day as needed. May take one more tab before allergy shots once a week.   Marland Kitchen aspirin 81 MG chewable tablet Chew 81 mg by mouth daily.   Marland Kitchen AUVI-Q 0.3 MG/0.3ML SOAJ injection Use as directed for life-threatening allergic reaction.   . cetirizine (ZYRTEC) 10 MG tablet Take 10 mg by mouth daily.   . cromolyn (OPTICROM) 4 % ophthalmic solution Place 1 drop into both eyes 4 (four) times daily.    . cycloSPORINE (RESTASIS) 0.05 % ophthalmic emulsion Place 1 drop into both eyes 2 (two) times daily.   . diclofenac (VOLTAREN) 0.1 % ophthalmic solution Place 1 drop into both eyes 3 (three) times daily.    . DULoxetine (CYMBALTA) 30 MG capsule TAKE ONE CAPSULE BY MOUTH EVERY DAY   . EPIDUO 0.1-2.5 % gel AFFECTED AREA(S) ON FACE AND CHEST EVERY NIGHT AT BEDTIME   . fluticasone (FLONASE) 50 MCG/ACT nasal spray Place 1 spray into both nostrils daily as needed for allergies or rhinitis.   . furosemide (LASIX) 20 MG tablet TAKE ONE TABLET BY MOUTH AS NEEDED for fluid   . HYDROcodone-acetaminophen (NORCO/VICODIN) 5-325 MG tablet Take 1 tablet by mouth every 6 (six) hours as needed for moderate pain or severe pain.    . IRON PO Take 1 tablet by mouth daily as needed (for low hemoglobin levels). Take when hemoglobin is low   . linaclotide (LINZESS) 290 MCG CAPS capsule TAKE ONE CAPSULE BY MOUTH EVERY DAY BEFORE BREAKFAST   . LORazepam (ATIVAN) 1 MG tablet Take 1 tablet (1 mg total) by mouth every 6 (six) hours as  needed for anxiety (or itching).   . medroxyPROGESTERone (PROVERA) 10 MG tablet Take 10 mg by mouth daily.   . Mepolizumab (NUCALA [place]) Inject into the skin every 28 (twenty-eight) days. 12/31/2017: 02/5/22019  . metFORMIN (GLUCOPHAGE) 500 MG tablet TAKE ONE TABLET BY MOUTH DAILY WITH BREAKFAST   . montelukast (SINGULAIR) 10 MG tablet TAKE 1 TABLET BY MOUTH AT BEDTIME   . Olopatadine HCl 0.6 % SOLN Use two sprays in each nostril once or twice daily (Patient taking differently: Place 2 puffs into both nostrils 2 (two) times daily. )   . pantoprazole (PROTONIX) 40 MG tablet Take 1 tablet (40 mg total) by mouth daily.   . potassium chloride SA (K-DUR,KLOR-CON) 20 MEQ tablet TAKE TWO TABLETS BY MOUTH DAILY FOR 3 DAYS, THEN TAKE 1 TABLET BY MOUTH DAILY WHILE TAKING LASIX   . pravastatin (PRAVACHOL) 20 MG tablet Take 1 tablet (20 mg total) by mouth daily.   Marland Kitchen PROAIR HFA 108 (90 Base) MCG/ACT inhaler INHALE 1-2 PUFFS EVERY 4 HOURS AS NEEDED WHEEZING OR SHORTNESS OF BREATH   . SYMBICORT 160-4.5 MCG/ACT inhaler inhale TWO puffs TWICE DAILY (Patient taking differently: INHALE ONE PUFF BY MOUTH TWICE DAILY) 12/17/2017: Patient does one puff twice daily.  Marland Kitchen VANIQA 13.9 % cream APPLY TO AFFECTED AREA TWICE DAILY   . VOLTAREN 1 %  GEL APPLY 2G TOPICALLY FOUR TIMES DAILY   . diphenhydrAMINE (BENADRYL) 25 MG tablet Take 12.5-25 mg by mouth every 6 (six) hours as needed for allergies.   Marland Kitchen losartan (COZAAR) 25 MG tablet Take 2 tablets (50 mg total) by mouth daily. (Patient not taking: Reported on 02/12/2018)   . medroxyPROGESTERone (PROVERA) 10 MG tablet Take 1 tablet (10 mg total) by mouth daily.    Facility-Administered Encounter Medications as of 02/12/2018  Medication  . Mepolizumab SOLR 100 mg    Subjective Seen as a follow up for Dr leggett request Having dysmenorrhea, dys synchronous uterine bleeding Sonogram unremarkable, 2 cm myoma ?Hx of endometriosis EMB shows mixed phase endometrium Not menopausal  obviously but peri menopausal Past Medical History:  Diagnosis Date  . Anemia   . Anemia   . Anxiety   . Arthritis   . Asthma   . Bilateral calcaneal spurs   . Bilateral polycystic ovarian syndrome   . Bulging lumbar disc   . Carpal tunnel syndrome, bilateral   . Conjunctivitis   . COPD (chronic obstructive pulmonary disease) (HCC)    BRONCHITIS  . Diabetes mellitus without complication ([place])    TYPE 2   DX  4-5 YRS AGO  . ETD (eustachian tube dysfunction)   . ETD (Eustachian tube dysfunction), bilateral    left worse than right  . Fibroids    uterine  . GAD (generalized anxiety disorder)   . GERD (gastroesophageal reflux disease)    TAKES PRESCRIPTION MEDS  . Glaucoma    BOTH EYES  . Hematoma    Hematoma of Left Hand- had to wear a split  . Hyperlipidemia   . Hypertension   . Hypothyroidism    NODULES ON THYROID  . PONV (postoperative nausea and vomiting)   . Pulmonary embolism ([place])   . Tachycardia   . TMJ (dislocation of temporomandibular joint)   . Trigger finger   . Trigger finger of left thumb     Past Surgical History:  Procedure Laterality Date  . ANTERIOR CERVICAL DECOMP/DISCECTOMY FUSION N/A 11/13/2016   Procedure: Cervical five-six, Cervical six-seven Anterior Cervical Discectomy and Fusion, Allograft, Plate;  Surgeon: Marybelle Killings, MD;  Location: [facility];  Service: Orthopedics;  Laterality: N/A;  . BREAST SURGERY    . CARPAL TUNNEL RELEASE    . DILATION AND CURETTAGE OF UTERUS    . EXPLORATORY LAPAROTOMY    . NASAL SINUS SURGERY  07/22/13   Dr. Redmond Pulling in [place]    OB History    Gravida  1   Para  1   Term      Preterm  1   AB      Living  1     SAB      TAB      Ectopic      Multiple      Live Births              Allergies  Allergen Reactions  . Demerol [Meperidine] Shortness Of Breath    FLUSHING AND SHORTNESS OF BREATH  . Morphine And Related Shortness Of Breath    Flushed and hot hyper Flushed and hot hyper Flushed and  hot hyper Flushed and hot hyper  . Diltiazem Hcl Hives  . Lisinopril     Caused cough  . Lyrica [Pregabalin]     Chest pain, elevated heart rate  . Tramadol Other (See Comments)    Tears up stomach    Social History  Socioeconomic History  . Marital status: Married    Spouse name: Not on file  . Number of children: Not on file  . Years of education: Not on file  . Highest education level: Not on file  Occupational History  . Occupation: Unemployed  Social Needs  . Financial resource strain: Not on file  . Food insecurity:    Worry: Not on file    Inability: Not on file  . Transportation needs:    Medical: Not on file    Non-medical: Not on file  Tobacco Use  . Smoking status: Former Smoker    Packs/day: 0.25    Years: 5.00    Pack years: 1.25    Types: Cigarettes    Last attempt to quit: 11/18/1989    Years since quitting: 28.2  . Smokeless tobacco: Never Used  Substance and Sexual Activity  . Alcohol use: No  . Drug use: No  . Sexual activity: Yes    Birth control/protection: None  Lifestyle  . Physical activity:    Days per week: Not on file    Minutes per session: Not on file  . Stress: Not on file  Relationships  . Social connections:    Talks on phone: Not on file    Gets together: Not on file    Attends religious service: Not on file    Active member of club or organization: Not on file    Attends meetings of clubs or organizations: Not on file    Relationship status: Not on file  Other Topics Concern  . Not on file  Social History Narrative  . Not on file    Family History  Problem Relation Age of Onset  . Hypertension Mother   . Hypertension Father     Medications:       Current Outpatient Medications:  .  albuterol (PROVENTIL) (2.5 MG/3ML) 0.083% nebulizer solution, Take 2.5 mg by nebulization every 6 (six) hours as needed for wheezing or shortness of breath., Disp: , Rfl:  .  ALPRAZolam (XANAX) 0.5 MG tablet, Take 1 tab twice a day as  needed. May take one more tab before allergy shots once a week., Disp: 64 tablet, Rfl: 2 .  aspirin 81 MG chewable tablet, Chew 81 mg by mouth daily., Disp: , Rfl:  .  AUVI-Q 0.3 MG/0.3ML SOAJ injection, Use as directed for life-threatening allergic reaction., Disp: 2 Device, Rfl: 3 .  cetirizine (ZYRTEC) 10 MG tablet, Take 10 mg by mouth daily., Disp: , Rfl:  .  cromolyn (OPTICROM) 4 % ophthalmic solution, Place 1 drop into both eyes 4 (four) times daily. , Disp: , Rfl: 1 .  cycloSPORINE (RESTASIS) 0.05 % ophthalmic emulsion, Place 1 drop into both eyes 2 (two) times daily., Disp: , Rfl:  .  diclofenac (VOLTAREN) 0.1 % ophthalmic solution, Place 1 drop into both eyes 3 (three) times daily. , Disp: , Rfl: 0 .  DULoxetine (CYMBALTA) 30 MG capsule, TAKE ONE CAPSULE BY MOUTH EVERY DAY, Disp: 30 capsule, Rfl: 2 .  EPIDUO 0.1-2.5 % gel, AFFECTED AREA(S) ON FACE AND CHEST EVERY NIGHT AT BEDTIME, Disp: , Rfl: 0 .  fluticasone (FLONASE) 50 MCG/ACT nasal spray, Place 1 spray into both nostrils daily as needed for allergies or rhinitis., Disp: , Rfl:  .  furosemide (LASIX) 20 MG tablet, TAKE ONE TABLET BY MOUTH AS NEEDED for fluid, Disp: 30 tablet, Rfl: 5 .  HYDROcodone-acetaminophen (NORCO/VICODIN) 5-325 MG tablet, Take 1 tablet by mouth every 6 (six)  hours as needed for moderate pain or severe pain. , Disp: , Rfl: 0 .  IRON PO, Take 1 tablet by mouth daily as needed (for low hemoglobin levels). Take when hemoglobin is low, Disp: , Rfl:  .  linaclotide (LINZESS) 290 MCG CAPS capsule, TAKE ONE CAPSULE BY MOUTH EVERY DAY BEFORE BREAKFAST, Disp: 90 capsule, Rfl: 1 .  LORazepam (ATIVAN) 1 MG tablet, Take 1 tablet (1 mg total) by mouth every 6 (six) hours as needed for anxiety (or itching)., Disp: 10 tablet, Rfl: 0 .  medroxyPROGESTERone (PROVERA) 10 MG tablet, Take 10 mg by mouth daily., Disp: , Rfl:  .  Mepolizumab (NUCALA [place]), Inject into the skin every 28 (twenty-eight) days., Disp: , Rfl:  .  metFORMIN  (GLUCOPHAGE) 500 MG tablet, TAKE ONE TABLET BY MOUTH DAILY WITH BREAKFAST, Disp: 90 tablet, Rfl: 0 .  montelukast (SINGULAIR) 10 MG tablet, TAKE 1 TABLET BY MOUTH AT BEDTIME, Disp: 30 tablet, Rfl: 5 .  Olopatadine HCl 0.6 % SOLN, Use two sprays in each nostril once or twice daily (Patient taking differently: Place 2 puffs into both nostrils 2 (two) times daily. ), Disp: 30.5 g, Rfl: 5 .  pantoprazole (PROTONIX) 40 MG tablet, Take 1 tablet (40 mg total) by mouth daily., Disp: 90 tablet, Rfl: 1 .  potassium chloride SA (K-DUR,KLOR-CON) 20 MEQ tablet, TAKE TWO TABLETS BY MOUTH DAILY FOR 3 DAYS, THEN TAKE 1 TABLET BY MOUTH DAILY WHILE TAKING LASIX, Disp: 30 tablet, Rfl: 0 .  pravastatin (PRAVACHOL) 20 MG tablet, Take 1 tablet (20 mg total) by mouth daily., Disp: 90 tablet, Rfl: 1 .  PROAIR HFA 108 (90 Base) MCG/ACT inhaler, INHALE 1-2 PUFFS EVERY 4 HOURS AS NEEDED WHEEZING OR SHORTNESS OF BREATH, Disp: 8.5 g, Rfl: 5 .  SYMBICORT 160-4.5 MCG/ACT inhaler, inhale TWO puffs TWICE DAILY (Patient taking differently: INHALE ONE PUFF BY MOUTH TWICE DAILY), Disp: 10.2 g, Rfl: 5 .  VANIQA 13.9 % cream, APPLY TO AFFECTED AREA TWICE DAILY, Disp: , Rfl: 5 .  VOLTAREN 1 % GEL, APPLY 2G TOPICALLY FOUR TIMES DAILY, Disp: 100 g, Rfl: 2 .  diphenhydrAMINE (BENADRYL) 25 MG tablet, Take 12.5-25 mg by mouth every 6 (six) hours as needed for allergies., Disp: , Rfl:  .  losartan (COZAAR) 25 MG tablet, Take 2 tablets (50 mg total) by mouth daily. (Patient not taking: Reported on 02/12/2018), Disp: 60 tablet, Rfl: 3 .  medroxyPROGESTERone (PROVERA) 10 MG tablet, Take 1 tablet (10 mg total) by mouth daily., Disp: 30 tablet, Rfl: 11  Current Facility-Administered Medications:  Marland Kitchen  Mepolizumab SOLR 100 mg, 100 mg, Subcutaneous, Q28 days, Valentina Shaggy, MD, 100 mg at 01/19/18 0825  Objective Blood pressure 112/60, pulse 88, height 5\' 2"  (1.575 m), weight 156 lb (70.8 kg).  Gen WDWN NAD  Pertinent ROS No burning with  urination, frequency or urgency No nausea, vomiting or diarrhea Nor fever chills or other constitutional symptoms   Labs or studies Noted and reviewed    Impression Diagnoses this Encounter::   ICD-10-CM   1. DUB (dysfunctional uterine bleeding) N93.8   2. Menometrorrhagia N92.1   3. Dyspareunia, female N94.10     Established relevant diagnosis(es):   Plan/Recommendations: Meds ordered this encounter  Medications  . medroxyPROGESTERone (PROVERA) 10 MG tablet    Sig: Take 1 tablet (10 mg total) by mouth daily.    Dispense:  30 tablet    Refill:  11    Labs or Scans Ordered: No orders of the  defined types were placed in this encounter.   Management:: Cyclical provera to navigate this perimenopuasl epoch of life Easy handed with provera increase intensity I needed Do not think surgery is needed or desired by pt at this point  Follow up Return in about 3 months (around 05/15/2018) for Follow up, with Dr Elonda Husky.        Face to face time:  20 minutes  Greater than 50% of the visit time was spent in counseling and coordination of care with the patient.  The summary and outline of the counseling and care coordination is summarized in the note above.   All questions were answered.

## 2018-02-18 ENCOUNTER — Ambulatory Visit (INDEPENDENT_AMBULATORY_CARE_PROVIDER_SITE_OTHER): Payer: Medicaid Other | Admitting: *Deleted

## 2018-02-18 DIAGNOSIS — J4550 Severe persistent asthma, uncomplicated: Secondary | ICD-10-CM

## 2018-02-20 ENCOUNTER — Ambulatory Visit (INDEPENDENT_AMBULATORY_CARE_PROVIDER_SITE_OTHER): Payer: Medicaid Other

## 2018-02-20 DIAGNOSIS — J309 Allergic rhinitis, unspecified: Secondary | ICD-10-CM | POA: Diagnosis not present

## 2018-02-23 ENCOUNTER — Ambulatory Visit (INDEPENDENT_AMBULATORY_CARE_PROVIDER_SITE_OTHER): Payer: Medicaid Other | Admitting: Otolaryngology

## 2018-02-23 DIAGNOSIS — J310 Chronic rhinitis: Secondary | ICD-10-CM

## 2018-02-23 DIAGNOSIS — J320 Chronic maxillary sinusitis: Secondary | ICD-10-CM | POA: Diagnosis not present

## 2018-02-24 ENCOUNTER — Ambulatory Visit (INDEPENDENT_AMBULATORY_CARE_PROVIDER_SITE_OTHER): Payer: Medicaid Other | Admitting: *Deleted

## 2018-02-24 DIAGNOSIS — J309 Allergic rhinitis, unspecified: Secondary | ICD-10-CM | POA: Diagnosis not present

## 2018-03-02 ENCOUNTER — Ambulatory Visit (INDEPENDENT_AMBULATORY_CARE_PROVIDER_SITE_OTHER): Payer: Medicaid Other | Admitting: Otolaryngology

## 2018-03-02 DIAGNOSIS — H9209 Otalgia, unspecified ear: Secondary | ICD-10-CM

## 2018-03-03 ENCOUNTER — Ambulatory Visit (INDEPENDENT_AMBULATORY_CARE_PROVIDER_SITE_OTHER): Payer: Medicaid Other | Admitting: *Deleted

## 2018-03-03 ENCOUNTER — Telehealth: Payer: Self-pay | Admitting: Pediatrics

## 2018-03-03 ENCOUNTER — Telehealth: Payer: Self-pay | Admitting: Allergy and Immunology

## 2018-03-03 DIAGNOSIS — J309 Allergic rhinitis, unspecified: Secondary | ICD-10-CM | POA: Diagnosis not present

## 2018-03-03 NOTE — Telephone Encounter (Signed)
Patient is requesting a list of copay payments and any other payments made to allergy and asthma from 08/18/2017 until present Patient requests that they be mailed to her If there are any questions please call

## 2018-03-03 NOTE — Telephone Encounter (Signed)
Patient states she needs her disability letter from 06/18/17 updated- she wants it wrote like it was but she would like new dx of fibermyalgia, TMJ, ETD and arthritis of RT leg. Please advise

## 2018-03-03 NOTE — Telephone Encounter (Signed)
Mailed printout to patient.  There were no copays, only insurance payments - kt

## 2018-03-04 ENCOUNTER — Telehealth: Payer: Self-pay | Admitting: *Deleted

## 2018-03-04 NOTE — Telephone Encounter (Signed)
Patient aware that she will need to be seen for letter.

## 2018-03-04 NOTE — Telephone Encounter (Signed)
Thank you.  She said this was not for disability?  Unclear what the letter is for. She has an appointment with me in 2 weeks and we can discuss further if needed.

## 2018-03-04 NOTE — Telephone Encounter (Signed)
Patient came in to office today cursing and being loud with an attitude stating that she needed an updated letter stating that she was in this office in 2019.  She states that she just needs a letter just like [name] wrote in 2017 but for 2019 with new diagnosis

## 2018-03-04 NOTE — Telephone Encounter (Signed)
Left message, may request copy of medical records.

## 2018-03-04 NOTE — Telephone Encounter (Signed)
Patient came to office to discuss.

## 2018-03-04 NOTE — Telephone Encounter (Signed)
Left message for patient to call .  She can request copies of medical records to review.

## 2018-03-04 NOTE — Telephone Encounter (Signed)
She can request medical records that will review those diagnoses.

## 2018-03-04 NOTE — Telephone Encounter (Signed)
Pt called back I explained to her that Dr Evette Doffing was wanting her to come in and request her medical records because that would have the diagnosis on there. The patient was not happy with that wanted to say that Dr Evette Doffing just needs to update the letter, I told the pt that from the message  Back from Dr Evette Doffing that it doesn't appear is she going to update the letter that she is saying the patient needs to come in to request her own medical records. The pt still wasn't happy and said she was going to come in this afternoon and talk to Dr Evette Doffing, I also advised her that she may not get to talk to Dr Evette Doffing because she will be seeing patients this afternoon pt states she didn't care and was coming in.

## 2018-03-05 ENCOUNTER — Encounter: Payer: Self-pay | Admitting: Pediatrics

## 2018-03-05 ENCOUNTER — Ambulatory Visit: Payer: Medicaid Other | Admitting: Pediatrics

## 2018-03-05 VITALS — BP 128/81 | HR 106 | Temp 97.8°F | Ht 62.0 in | Wt 151.6 lb

## 2018-03-05 DIAGNOSIS — J4540 Moderate persistent asthma, uncomplicated: Secondary | ICD-10-CM | POA: Diagnosis not present

## 2018-03-05 DIAGNOSIS — F411 Generalized anxiety disorder: Secondary | ICD-10-CM | POA: Diagnosis not present

## 2018-03-05 DIAGNOSIS — H1089 Other conjunctivitis: Secondary | ICD-10-CM | POA: Diagnosis not present

## 2018-03-05 NOTE — Progress Notes (Signed)
  Subjective:   Patient ID: Jennifer Mullins, female    DOB: 08/15/1971, 47 y.o.   MRN: [id number] CC: Letter for School/Work  HPI: Jennifer Mullins is a 47 y.o. female presenting for Letter for School/Work  Ongoing sinus symptoms. Using saline spray, asks about neti pot. Following with allergy and ophthalmology for severe allergies.   Requests letter with her current diagnoses.  Anxiety: would like #65 tabs of xanax a month, enough to take one with allergy shots weekly and nucala once a month. She never drives to those appts, always has a driver.  Has a very dry/sore area inside R nostril.  Relevant past medical, surgical, family and social history reviewed. Allergies and medications reviewed and updated. Social History   Tobacco Use  Smoking Status Former Smoker  . Packs/day: 0.25  . Years: 5.00  . Pack years: 1.25  . Types: Cigarettes  . Last attempt to quit: 11/18/1989  . Years since quitting: 28.3  Smokeless Tobacco Never Used   ROS: Per HPI   Objective:    BP 128/81   Pulse (!) 106   Temp 97.8 F (36.6 C) (Oral)   Ht 5\' 2"  (1.575 m)   Wt 151 lb 9.6 oz (68.8 kg)   BMI 27.73 kg/m   Wt Readings from Last 3 Encounters:  03/05/18 151 lb 9.6 oz (68.8 kg)  02/12/18 156 lb (70.8 kg)  01/29/18 152 lb (68.9 kg)    Gen: NAD, alert, cooperative with exam, NCAT EYES: EOMI, no conjunctival injection, or no icterus ENT:  TMs pearly gray b/l, OP without erythema, erythema present R nare along septum LYMPH: no cervical LAD CV: NRRR, normal S1/S2, no murmur, distal pulses 2+ b/l Resp: CTABL, no wheezes, normal WOB Abd: +BS, soft, NTND. no guarding or organomegaly Ext: No edema, warm Neuro: Alert and oriented  Assessment & Plan:  Jennifer Mullins was seen today for letter for school/work and follow up.   Diagnoses and all orders for this visit:  GAD (generalized anxiety disorder) OK to take xanax with nucala  Giant papillary conjunctivitis Using eye drops  Total time spent with the  patient was 15 minutes with greater than 50% of the time spent in face-to-face.   Follow up plan: Return if symptoms worsen or fail to improve. [name], MD [place]

## 2018-03-05 NOTE — Patient Instructions (Signed)
nasogel or similar for salt water gel

## 2018-03-10 ENCOUNTER — Ambulatory Visit (INDEPENDENT_AMBULATORY_CARE_PROVIDER_SITE_OTHER): Payer: Medicaid Other | Admitting: *Deleted

## 2018-03-10 DIAGNOSIS — J309 Allergic rhinitis, unspecified: Secondary | ICD-10-CM

## 2018-03-18 ENCOUNTER — Ambulatory Visit: Payer: Medicaid Other | Admitting: Pediatrics

## 2018-03-18 ENCOUNTER — Ambulatory Visit (INDEPENDENT_AMBULATORY_CARE_PROVIDER_SITE_OTHER): Payer: Medicaid Other

## 2018-03-18 DIAGNOSIS — J309 Allergic rhinitis, unspecified: Secondary | ICD-10-CM | POA: Diagnosis not present

## 2018-03-23 ENCOUNTER — Ambulatory Visit (INDEPENDENT_AMBULATORY_CARE_PROVIDER_SITE_OTHER): Payer: Medicaid Other | Admitting: *Deleted

## 2018-03-23 ENCOUNTER — Ambulatory Visit (INDEPENDENT_AMBULATORY_CARE_PROVIDER_SITE_OTHER): Payer: Medicaid Other | Admitting: Otolaryngology

## 2018-03-23 DIAGNOSIS — H6983 Other specified disorders of Eustachian tube, bilateral: Secondary | ICD-10-CM

## 2018-03-23 DIAGNOSIS — J310 Chronic rhinitis: Secondary | ICD-10-CM | POA: Diagnosis not present

## 2018-03-23 DIAGNOSIS — J4550 Severe persistent asthma, uncomplicated: Secondary | ICD-10-CM

## 2018-03-24 ENCOUNTER — Other Ambulatory Visit: Payer: Self-pay

## 2018-03-24 ENCOUNTER — Emergency Department (HOSPITAL_COMMUNITY): Payer: Medicaid Other

## 2018-03-24 ENCOUNTER — Emergency Department (HOSPITAL_COMMUNITY)
Admission: EM | Admit: 2018-03-24 | Discharge: 2018-03-24 | Disposition: A | Discharge status: Home / Self Care | Payer: Medicaid Other | Attending: Emergency Medicine | Admitting: Emergency Medicine

## 2018-03-24 ENCOUNTER — Encounter (HOSPITAL_COMMUNITY): Payer: Self-pay

## 2018-03-24 DIAGNOSIS — S199XXA Unspecified injury of neck, initial encounter: Secondary | ICD-10-CM | POA: Diagnosis present

## 2018-03-24 DIAGNOSIS — J45909 Unspecified asthma, uncomplicated: Secondary | ICD-10-CM | POA: Insufficient documentation

## 2018-03-24 DIAGNOSIS — Y929 Unspecified place or not applicable: Secondary | ICD-10-CM | POA: Insufficient documentation

## 2018-03-24 DIAGNOSIS — Z87891 Personal history of nicotine dependence: Secondary | ICD-10-CM | POA: Diagnosis not present

## 2018-03-24 DIAGNOSIS — Y939 Activity, unspecified: Secondary | ICD-10-CM | POA: Diagnosis not present

## 2018-03-24 DIAGNOSIS — Y999 Unspecified external cause status: Secondary | ICD-10-CM | POA: Diagnosis not present

## 2018-03-24 DIAGNOSIS — I10 Essential (primary) hypertension: Secondary | ICD-10-CM | POA: Diagnosis not present

## 2018-03-24 DIAGNOSIS — Z7984 Long term (current) use of oral hypoglycemic drugs: Secondary | ICD-10-CM | POA: Diagnosis not present

## 2018-03-24 DIAGNOSIS — E119 Type 2 diabetes mellitus without complications: Secondary | ICD-10-CM | POA: Insufficient documentation

## 2018-03-24 DIAGNOSIS — Z7982 Long term (current) use of aspirin: Secondary | ICD-10-CM | POA: Insufficient documentation

## 2018-03-24 DIAGNOSIS — S39012A Strain of muscle, fascia and tendon of lower back, initial encounter: Secondary | ICD-10-CM | POA: Insufficient documentation

## 2018-03-24 DIAGNOSIS — S161XXA Strain of muscle, fascia and tendon at neck level, initial encounter: Secondary | ICD-10-CM | POA: Diagnosis not present

## 2018-03-24 MED ORDER — HYDROCODONE-ACETAMINOPHEN 5-325 MG PO TABS: 1.0000 | ORAL_TABLET | ORAL | 0 refills | Status: DC | PRN

## 2018-03-24 MED ORDER — HYDROCODONE-ACETAMINOPHEN 5-325 MG PO TABS
2.0000 | ORAL_TABLET | Freq: Once | ORAL | Status: AC
  Administered 2018-03-24: 2 via ORAL
  Filled 2018-03-24: qty 2

## 2018-03-24 MED ORDER — DIAZEPAM 5 MG PO TABS: ORAL_TABLET | ORAL | 0 refills | Status: DC

## 2018-03-24 MED ORDER — DIAZEPAM 5 MG PO TABS
10.0000 mg | ORAL_TABLET | Freq: Once | ORAL | Status: AC
  Administered 2018-03-24: 10 mg via ORAL
  Filled 2018-03-24: qty 2

## 2018-03-24 NOTE — ED Notes (Signed)
Patient walked 87ft to the nurses station prior to getting in the wheelchair, no apparent problem seen.
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
## 2018-03-25 ENCOUNTER — Encounter: Payer: Self-pay | Admitting: Family

## 2018-03-25 ENCOUNTER — Telehealth: Payer: Self-pay | Admitting: Pediatrics

## 2018-03-25 ENCOUNTER — Ambulatory Visit: Payer: Medicaid Other | Admitting: Family

## 2018-03-25 VITALS — BP 133/84 | HR 107 | Temp 98.5°F | Ht 62.0 in | Wt 153.2 lb

## 2018-03-25 DIAGNOSIS — Z09 Encounter for follow-up examination after completed treatment for conditions other than malignant neoplasm: Secondary | ICD-10-CM

## 2018-03-25 DIAGNOSIS — S161XXD Strain of muscle, fascia and tendon at neck level, subsequent encounter: Secondary | ICD-10-CM

## 2018-03-25 DIAGNOSIS — S161XXA Strain of muscle, fascia and tendon at neck level, initial encounter: Secondary | ICD-10-CM

## 2018-03-25 MED ORDER — HYDROCODONE-ACETAMINOPHEN 7.5-325 MG PO TABS: 1.0000 | ORAL_TABLET | Freq: Four times a day (QID) | ORAL | 0 refills | Status: DC | PRN

## 2018-03-25 NOTE — Telephone Encounter (Signed)
Apt made with [facility] today
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
## 2018-03-25 NOTE — Patient Instructions (Signed)
Cervical Strain and Sprain Rehab Ask your health care provider which exercises are safe for you. Do exercises exactly as told by your health care provider and adjust them as directed. It is normal to feel mild stretching, pulling, tightness, or discomfort as you do these exercises, but you should stop right away if you feel sudden pain or your pain gets worse.Do not begin these exercises until told by your health care provider. Stretching and range of motion exercises These exercises warm up your muscles and joints and improve the movement and flexibility of your neck. These exercises also help to relieve pain, numbness, and tingling. Exercise A: Cervical side bend  1. Using good posture, sit on a stable chair or stand up. 2. Without moving your shoulders, slowly tilt your left / right ear to your shoulder until you feel a stretch in your neck muscles. You should be looking straight ahead. 3. Hold for __________ seconds. 4. Repeat with the other side of your neck. Repeat __________ times. Complete this exercise __________ times a day. Exercise B: Cervical rotation  1. Using good posture, sit on a stable chair or stand up. 2. Slowly turn your head to the side as if you are looking over your left / right shoulder. ? Keep your eyes level with the ground. ? Stop when you feel a stretch along the side and the back of your neck. 3. Hold for __________ seconds. 4. Repeat this by turning to your other side. Repeat __________ times. Complete this exercise __________ times a day. Exercise C: Thoracic extension and pectoral stretch 1. Roll a towel or a small blanket so it is about 4 inches (10 cm) in diameter. 2. Lie down on your back on a firm surface. 3. Put the towel lengthwise, under your spine in the middle of your back. It should not be not under your shoulder blades. The towel should line up with your spine from your middle back to your lower back. 4. Put your hands behind your head and let your  elbows fall out to your sides. 5. Hold for __________ seconds. Repeat __________ times. Complete this exercise __________ times a day. Strengthening exercises These exercises build strength and endurance in your neck. Endurance is the ability to use your muscles for a long time, even after your muscles get tired. Exercise D: Upper cervical flexion, isometric 1. Lie on your back with a thin pillow behind your head and a small rolled-up towel under your neck. 2. Gently tuck your chin toward your chest and nod your head down to look toward your feet. Do not lift your head off the pillow. 3. Hold for __________ seconds. 4. Release the tension slowly. Relax your neck muscles completely before you repeat this exercise. Repeat __________ times. Complete this exercise __________ times a day. Exercise E: Cervical extension, isometric  1. Stand about 6 inches (15 cm) away from a wall, with your back facing the wall. 2. Place a soft object, about 6-8 inches (15-20 cm) in diameter, between the back of your head and the wall. A soft object could be a small pillow, a ball, or a folded towel. 3. Gently tilt your head back and press into the soft object. Keep your jaw and forehead relaxed. 4. Hold for __________ seconds. 5. Release the tension slowly. Relax your neck muscles completely before you repeat this exercise. Repeat __________ times. Complete this exercise __________ times a day. Posture and body mechanics  Body mechanics refers to the movements and positions of   your body while you do your daily activities. Posture is part of body mechanics. Good posture and healthy body mechanics can help to relieve stress in your body's tissues and joints. Good posture means that your spine is in its natural S-curve position (your spine is neutral), your shoulders are pulled back slightly, and your head is not tipped forward. The following are general guidelines for applying improved posture and body mechanics to  your everyday activities. Standing  When standing, keep your spine neutral and keep your feet about hip-width apart. Keep a slight bend in your knees. Your ears, shoulders, and hips should line up.  When you do a task in which you stand in one place for a long time, place one foot up on a stable object that is 2-4 inches (5-10 cm) high, such as a footstool. This helps keep your spine neutral. Sitting   When sitting, keep your spine neutral and your keep feet flat on the floor. Use a footrest, if necessary, and keep your thighs parallel to the floor. Avoid rounding your shoulders, and avoid tilting your head forward.  When working at a desk or a computer, keep your desk at a height where your hands are slightly lower than your elbows. Slide your chair under your desk so you are close enough to maintain good posture.  When working at a computer, place your monitor at a height where you are looking straight ahead and you do not have to tilt your head forward or downward to look at the screen. Resting When lying down and resting, avoid positions that are most painful for you. Try to support your neck in a neutral position. You can use a contour pillow or a small rolled-up towel. Your pillow should support your neck but not push on it. This information is not intended to replace advice given to you by your health care provider. Make sure you discuss any questions you have with your health care provider. Document Released: 11/04/2005 Document Revised: 07/11/2016 Document Reviewed: 10/11/2015 Elsevier Interactive Patient Education  2018 [employer]  

## 2018-03-26 ENCOUNTER — Ambulatory Visit (INDEPENDENT_AMBULATORY_CARE_PROVIDER_SITE_OTHER): Payer: Medicaid Other | Admitting: *Deleted

## 2018-03-26 DIAGNOSIS — J309 Allergic rhinitis, unspecified: Secondary | ICD-10-CM

## 2018-04-02 ENCOUNTER — Ambulatory Visit (INDEPENDENT_AMBULATORY_CARE_PROVIDER_SITE_OTHER): Payer: Medicaid Other

## 2018-04-02 DIAGNOSIS — J309 Allergic rhinitis, unspecified: Secondary | ICD-10-CM | POA: Diagnosis not present

## 2018-04-04 ENCOUNTER — Ambulatory Visit: Payer: Medicaid Other | Admitting: Nurse Practitioner

## 2018-04-04 ENCOUNTER — Encounter: Payer: Self-pay | Admitting: Nurse Practitioner

## 2018-04-04 VITALS — BP 122/78 | HR 81 | Temp 99.2°F | Ht 60.0 in | Wt 152.0 lb

## 2018-04-04 DIAGNOSIS — J029 Acute pharyngitis, unspecified: Secondary | ICD-10-CM | POA: Diagnosis not present

## 2018-04-04 DIAGNOSIS — L209 Atopic dermatitis, unspecified: Secondary | ICD-10-CM | POA: Diagnosis not present

## 2018-04-04 NOTE — Progress Notes (Signed)
   Subjective:    Patient ID: Jennifer Mullins, female    DOB: Jan 10, 1971, 47 y.o.   MRN: [id number]   Chief Complaint: Sore Throat   HPI Patient comes in today c/o sore throat that started yesterday. Hard  To swallow.   Review of Systems  Constitutional: Negative for chills and fever.  HENT: Positive for sore throat and trouble swallowing. Negative for congestion.   Respiratory: Negative for cough and shortness of breath.   Cardiovascular: Negative.   Gastrointestinal: Negative.   Genitourinary: Negative.   Neurological: Positive for headaches.  Psychiatric/Behavioral: Negative.   All other systems reviewed and are negative.      Objective:   Physical Exam  Constitutional: She is oriented to person, place, and time. She appears well-developed and well-nourished.  HENT:  Right Ear: Hearing, tympanic membrane and ear canal normal.  Left Ear: Hearing and tympanic membrane normal.  Mouth/Throat: Uvula is midline, oropharynx is clear and moist and mucous membranes are normal. No oral lesions. No uvula swelling. No oropharyngeal exudate, posterior oropharyngeal edema, posterior oropharyngeal erythema or tonsillar abscesses.  Eyes: Pupils are equal, round, and reactive to light.  Neck: Normal range of motion. Neck supple.  Cardiovascular: Normal rate.  Pulmonary/Chest: Effort normal.  Abdominal: Soft. Bowel sounds are normal.  Lymphadenopathy:    She has no cervical adenopathy.  Neurological: She is alert and oriented to person, place, and time.  Skin: Skin is warm and dry.  Red linear rash on left side of neck  Psychiatric: She has a normal mood and affect. Her behavior is normal.    BP 122/78   Pulse 81   Temp 99.2 F (37.3 C) (Oral)   Ht 5' (1.524 m)   Wt 152 lb (68.9 kg)   LMP 03/18/2018   BMI 29.69 kg/m   Strep negative      Assessment & Plan:  Jennifer Mullins in today with chief complaint of Sore Throat   1. Sore throat - Rapid Strep Screen ([facility] & [facility]  ONLY)  2. Viral pharyngitis Force fluids Motrin or tylenol OTC OTC decongestant Throat lozenges if help New toothbrush in 3 days   3. Atopic dermatitis, unspecified type OTC hydrocortisone cream topically BID Avoid scratching or rubbing Follow up with PCP  Mary-Margaret Hassell Done, FNP

## 2018-04-04 NOTE — Patient Instructions (Addendum)

## 2018-04-06 LAB — RAPID STREP SCREEN (MED CTR MEBANE ONLY): Strep Gp A Ag, IA W/Reflex: NEGATIVE

## 2018-04-06 LAB — CULTURE, GROUP A STREP

## 2018-04-10 ENCOUNTER — Other Ambulatory Visit: Payer: Self-pay | Admitting: Pediatrics

## 2018-04-10 ENCOUNTER — Ambulatory Visit (INDEPENDENT_AMBULATORY_CARE_PROVIDER_SITE_OTHER): Payer: Medicaid Other

## 2018-04-10 DIAGNOSIS — K219 Gastro-esophageal reflux disease without esophagitis: Secondary | ICD-10-CM

## 2018-04-10 DIAGNOSIS — J309 Allergic rhinitis, unspecified: Secondary | ICD-10-CM | POA: Diagnosis not present

## 2018-04-10 DIAGNOSIS — K5900 Constipation, unspecified: Secondary | ICD-10-CM

## 2018-04-16 ENCOUNTER — Ambulatory Visit (INDEPENDENT_AMBULATORY_CARE_PROVIDER_SITE_OTHER): Payer: Medicaid Other | Admitting: *Deleted

## 2018-04-16 ENCOUNTER — Ambulatory Visit: Payer: Medicaid Other | Admitting: Pediatrics

## 2018-04-16 DIAGNOSIS — J309 Allergic rhinitis, unspecified: Secondary | ICD-10-CM | POA: Diagnosis not present

## 2018-04-18 ENCOUNTER — Other Ambulatory Visit: Payer: Self-pay | Admitting: Pediatrics

## 2018-04-20 ENCOUNTER — Other Ambulatory Visit: Payer: Self-pay | Admitting: Allergy and Immunology

## 2018-04-20 ENCOUNTER — Ambulatory Visit (INDEPENDENT_AMBULATORY_CARE_PROVIDER_SITE_OTHER): Payer: Medicaid Other

## 2018-04-20 DIAGNOSIS — J4550 Severe persistent asthma, uncomplicated: Secondary | ICD-10-CM

## 2018-04-22 ENCOUNTER — Ambulatory Visit: Payer: Medicaid Other | Admitting: Pediatrics

## 2018-04-22 ENCOUNTER — Encounter: Payer: Self-pay | Admitting: Pediatrics

## 2018-04-22 VITALS — BP 132/86 | HR 92 | Temp 99.2°F | Ht 60.0 in | Wt 152.4 lb

## 2018-04-22 DIAGNOSIS — E785 Hyperlipidemia, unspecified: Secondary | ICD-10-CM | POA: Diagnosis not present

## 2018-04-22 DIAGNOSIS — E282 Polycystic ovarian syndrome: Secondary | ICD-10-CM

## 2018-04-22 DIAGNOSIS — F411 Generalized anxiety disorder: Secondary | ICD-10-CM | POA: Diagnosis not present

## 2018-04-22 DIAGNOSIS — R7303 Prediabetes: Secondary | ICD-10-CM | POA: Diagnosis not present

## 2018-04-22 DIAGNOSIS — I10 Essential (primary) hypertension: Secondary | ICD-10-CM

## 2018-04-22 DIAGNOSIS — R609 Edema, unspecified: Secondary | ICD-10-CM

## 2018-04-22 DIAGNOSIS — R3915 Urgency of urination: Secondary | ICD-10-CM

## 2018-04-22 DIAGNOSIS — E042 Nontoxic multinodular goiter: Secondary | ICD-10-CM

## 2018-04-22 DIAGNOSIS — D649 Anemia, unspecified: Secondary | ICD-10-CM

## 2018-04-22 LAB — URINALYSIS, COMPLETE
Bilirubin, UA: NEGATIVE
Glucose, UA: NEGATIVE
Ketones, UA: NEGATIVE
Leukocytes, UA: NEGATIVE
Nitrite, UA: NEGATIVE
Protein, UA: NEGATIVE
RBC, UA: NEGATIVE
Specific Gravity, UA: <1.005 — ABNORMAL LOW (ref 1.005–1.030)
Urobilinogen, Ur: 0.2 mg/dL (ref 0.2–1.0)
pH, UA: 5.5 (ref 5.0–7.5)

## 2018-04-22 LAB — MICROSCOPIC EXAMINATION
Epithelial Cells (non renal): >10 /hpf — AB (ref 0–10)
RBC, UA: NONE SEEN /hpf (ref 0–2)
Renal Epithel, UA: NONE SEEN /hpf

## 2018-04-22 LAB — BAYER DCA HB A1C WAIVED: HB A1C (BAYER DCA - WAIVED): 5.8 % (ref <7.0)

## 2018-04-22 MED ORDER — LOSARTAN POTASSIUM 25 MG PO TABS: 25.0000 mg | ORAL_TABLET | Freq: Two times a day (BID) | ORAL | 2 refills | Status: DC

## 2018-04-22 MED ORDER — PRAVASTATIN SODIUM 20 MG PO TABS: 20.0000 mg | ORAL_TABLET | Freq: Every day | ORAL | 1 refills | Status: DC

## 2018-04-22 MED ORDER — DULOXETINE HCL 20 MG PO CPEP
20.0000 mg | ORAL_CAPSULE | Freq: Every day | ORAL | 1 refills | Status: DC
Start: 2018-04-22 — End: 2018-06-30

## 2018-04-22 MED ORDER — FUROSEMIDE 20 MG PO TABS: ORAL_TABLET | ORAL | 3 refills | Status: DC

## 2018-04-22 MED ORDER — METFORMIN HCL 500 MG PO TABS: 500.0000 mg | ORAL_TABLET | Freq: Every day | ORAL | 1 refills | Status: DC

## 2018-04-22 MED ORDER — ALPRAZOLAM 0.5 MG PO TABS: ORAL_TABLET | ORAL | 3 refills | Status: DC

## 2018-04-22 NOTE — Progress Notes (Signed)
Subjective:   Patient ID: Jennifer Mullins, female    DOB: [birth year], 47 y.o.   MRN: [id number] CC: Medical Management of Chronic Issues  HPI: Jennifer Mullins is a 47 y.o. female   Anxiety: Stable.  Is worse anxiety the time of her allergy shots, happened 5 times a month.  Aching Xanax twice a day, takes an extra one on the days of her injections.  Following with allergy.  Urinary symptoms: Noticed more frequent urination over the last couple weeks.  No dysuria.  No fevers.  No abdominal pain.  Has been taking Lasix most days, helps with fluid retention.  No fevers at home.  Anemia: Following with gynecology for heavy periods.  Diabetes: On metformin alone.  Recent A1c's have been well controlled.  Thyroid nodules: 2 small nodules, recently seen by endocrinology at [facility].  Given appearance on ultrasound, recommended no further thyroid ultrasounds unless exam warrants otherwise.  Hypertension: Recent blood pressures 120s to 130s at home.  Sometimes taking losartan 1-2 times a day, sometimes not.  Relevant past medical, surgical, family and social history reviewed. Allergies and medications reviewed and updated. Social History   Tobacco Use  Smoking Status Former Smoker  . Packs/day: 0.25  . Years: 5.00  . Pack years: 1.25  . Types: Cigarettes  . Last attempt to quit: 11/18/1989  . Years since quitting: 28.4  Smokeless Tobacco Never Used   ROS: Per HPI   Objective:    BP 132/86   Pulse 92   Temp 99.2 F (37.3 C) (Oral)   Ht 5' (1.524 m)   Wt 152 lb 6.4 oz (69.1 kg)   BMI 29.76 kg/m   Wt Readings from Last 3 Encounters:  04/22/18 152 lb 6.4 oz (69.1 kg)  04/04/18 152 lb (68.9 kg)  03/25/18 153 lb 3.2 oz (69.5 kg)    Gen: NAD, alert, cooperative with exam, NCAT EYES: EOMI, no conjunctival injection, or no icterus ENT:  TMs dull gray b/l, OP without erythema LYMPH: no cervical LAD CV: NRRR, normal S1/S2, no murmur, distal pulses 2+ b/l Resp: CTABL, no wheezes, normal  WOB Abd: +BS, soft, NTND. no guarding or organomegaly Ext: No edema, warm Neuro: Alert and oriented, strength equal b/l UE and LE, coordination grossly normal MSK: normal muscle bulk  Assessment & Plan:  Jennifer Mullins was seen today for medical management of chronic issues.  Diagnoses and all orders for this visit:  Urinary urgency Urine unremarkable, symptoms likely due to lasix.  Take only as needed for fluid present in the morning. -     Urinalysis, Complete -     Urine Culture; Future -     Urine Culture  GAD (generalized anxiety disorder) Stable.  Continue Cymbalta.  Xanax dosing unchanged for now, continue to discuss risk/benefit with pt and risk of co-prescription of narcotic pain from pain management. -     ALPRAZolam (XANAX) 0.5 MG tablet; Take 1 tab twice a day as needed. May take one more tab before allergy shots once a week. -     DULoxetine (CYMBALTA) 20 MG capsule; Take 1 capsule (20 mg total) by mouth daily.  Body fluid retention Take only as needed -     furosemide (LASIX) 20 MG tablet; TAKE ONE TABLET BY MOUTH AS NEEDED for fluid  Essential hypertension Take 25 mg every day in the morning, decreased from twice a day.  -     losartan (COZAAR) 25 MG tablet; Take 1 tablet (25 mg total)  -  BMP8+EGFR  PCOS (polycystic ovarian syndrome) -     metFORMIN (GLUCOPHAGE) 500 MG tablet; Take 1 tablet (500 mg total) by mouth daily with breakfast.  Hyperlipidemia, unspecified hyperlipidemia type Stable, continue below -     pravastatin (PRAVACHOL) 20 MG tablet; Take 1 tablet (20 mg total) by mouth daily.  Prediabetes -     Bayer DCA Hb A1c Waived  Multiple thyroid nodules No further ultrasounds needed per endocrinology -     TSH  Anemia, unspecified type Heavy periods -     CBC with Differential  Other orders -     Microscopic Examination   Follow up plan: Return in about 4 months (around 08/22/2018). [name], MD [place]

## 2018-04-22 NOTE — Patient Instructions (Signed)
Mental Health Apps and Websites Here are a few free apps meant to help you to help yourself.  To find, try searching on the internet to see if the app is offered on Apple/Android devices. If your first choice doesn't come up on your device, the good news is that there are many choices! Play around with different apps to see which ones are helpful to you . Calm This is an app meant to help increase calm feelings. Includes info, strategies, and tools for tracking your feelings.   Calm Harm  This app is meant to help with self-harm. Provides many 5-minute or 15-min coping strategies for doing instead of hurting yourself.    Healthy Minds Health Minds is a problem-solving tool to help deal with emotions and cope with stress you encounter wherever you are.    MindShift This app can help people cope with anxiety. Rather than trying to avoid anxiety, you can make an important shift and face it.    MY3  MY3 features a support system, safety plan and resources with the goal of offering a tool to use in a time of need.    My Life My Voice  This mood journal offers a simple solution for tracking your thoughts, feelings and moods. Animated emoticons can help identify your mood.   Relax Melodies Designed to help with sleep, on this app you can mix sounds and meditations for relaxation.    Smiling Mind Smiling Mind is meditation made easy: it's a simple tool that helps put a smile on your mind.    Stop, Breathe & Think  A friendly, simple guide for people through meditations for mindfulness and compassion.  Stop, Breathe and Think Kids Enter your current feelings and choose a "mission" to help you cope. Offers videos for certain moods instead of just sound recordings.     The Virtual Hope Box The Virtual Hope Box (VHB) contains simple tools to help patients with coping, relaxation, distraction, and positive thinking.    

## 2018-04-23 ENCOUNTER — Encounter: Payer: Self-pay | Admitting: Pediatrics

## 2018-04-23 ENCOUNTER — Other Ambulatory Visit: Payer: Self-pay | Admitting: Pediatrics

## 2018-04-23 ENCOUNTER — Ambulatory Visit: Payer: Medicaid Other | Admitting: Pediatrics

## 2018-04-23 ENCOUNTER — Telehealth: Payer: Self-pay | Admitting: Obstetrics & Gynecology

## 2018-04-23 ENCOUNTER — Telehealth: Payer: Self-pay | Admitting: *Deleted

## 2018-04-23 DIAGNOSIS — M797 Fibromyalgia: Secondary | ICD-10-CM

## 2018-04-23 LAB — CBC WITH DIFFERENTIAL/PLATELET
Basophils Absolute: 0 10*3/uL (ref 0.0–0.2)
Basos: 0 %
EOS (ABSOLUTE): 0 10*3/uL (ref 0.0–0.4)
Eos: 0 %
Hematocrit: 31.9 % — ABNORMAL LOW (ref 34.0–46.6)
Hemoglobin: 10.4 g/dL — ABNORMAL LOW (ref 11.1–15.9)
Immature Grans (Abs): 0 10*3/uL (ref 0.0–0.1)
Immature Granulocytes: 0 %
Lymphocytes Absolute: 1.9 10*3/uL (ref 0.7–3.1)
Lymphs: 33 %
MCH: 26.9 pg (ref 26.6–33.0)
MCHC: 32.6 g/dL (ref 31.5–35.7)
MCV: 83 fL (ref 79–97)
Monocytes Absolute: 0.4 10*3/uL (ref 0.1–0.9)
Monocytes: 8 %
Neutrophils Absolute: 3.3 10*3/uL (ref 1.4–7.0)
Neutrophils: 59 %
Platelets: 264 10*3/uL (ref 150–450)
RBC: 3.86 x10E6/uL (ref 3.77–5.28)
RDW: 15.2 % (ref 12.3–15.4)
WBC: 5.7 10*3/uL (ref 3.4–10.8)

## 2018-04-23 LAB — BMP8+EGFR
BUN/Creatinine Ratio: 14 (ref 9–23)
BUN: 9 mg/dL (ref 6–24)
CO2: 23 mmol/L (ref 20–29)
Calcium: 8.9 mg/dL (ref 8.7–10.2)
Chloride: 103 mmol/L (ref 96–106)
Creatinine, Ser: 0.64 mg/dL (ref 0.57–1.00)
GFR calc Af Amer: 121 mL/min/{1.73_m2} (ref >59)
GFR calc non Af Amer: 105 mL/min/{1.73_m2} (ref >59)
Glucose: 72 mg/dL (ref 65–99)
Potassium: 3.8 mmol/L (ref 3.5–5.2)
Sodium: 140 mmol/L (ref 134–144)

## 2018-04-23 LAB — TSH: TSH: 0.856 u[IU]/mL (ref 0.450–4.500)

## 2018-04-23 LAB — URINE CULTURE

## 2018-04-23 NOTE — Telephone Encounter (Signed)
Basically correct, take for 21 days off for 7 days, just loke OCP sort of The prescription is written that way so she doesn't have to refill as often

## 2018-04-23 NOTE — Telephone Encounter (Signed)
Per DPR left voicemail for patient that both labs were checked within the last year and were normal.

## 2018-04-23 NOTE — Telephone Encounter (Signed)
Both were checked within the last year and were normal, they do not need to be rechecked at this time.

## 2018-04-23 NOTE — Telephone Encounter (Signed)
Pt called to clarify how she should be taking the provera. Her prescription states to take one daily but she thinks that she was told to take it for 20 days and then stop for 7 days and then start again. Advised patient that I will send message to confirm how she should take meds.

## 2018-04-23 NOTE — Telephone Encounter (Signed)
Pt would like a testosterone and lipid panel added ?

## 2018-04-23 NOTE — Telephone Encounter (Signed)
Needs to talk to someone about how to take her meds I read her what directions said and she said he told her something diff and I did not see that in chart can you call her and advise

## 2018-04-24 ENCOUNTER — Telehealth: Payer: Self-pay | Admitting: *Deleted

## 2018-04-24 ENCOUNTER — Ambulatory Visit (INDEPENDENT_AMBULATORY_CARE_PROVIDER_SITE_OTHER): Payer: Medicaid Other

## 2018-04-24 DIAGNOSIS — J309 Allergic rhinitis, unspecified: Secondary | ICD-10-CM | POA: Diagnosis not present

## 2018-04-24 NOTE — Telephone Encounter (Signed)
Patient called back need more clarification. reinformed patient she is to take for 21 days then off for 7 days despite if she has bleeding or not.  Dr Elonda Husky on the phone with patient again to reiterate. Finally verbalized understanding after 8 minutes conversation.

## 2018-04-30 ENCOUNTER — Other Ambulatory Visit: Payer: Self-pay | Admitting: Allergy and Immunology

## 2018-04-30 ENCOUNTER — Ambulatory Visit (INDEPENDENT_AMBULATORY_CARE_PROVIDER_SITE_OTHER): Payer: Medicaid Other

## 2018-04-30 DIAGNOSIS — J4540 Moderate persistent asthma, uncomplicated: Secondary | ICD-10-CM

## 2018-04-30 DIAGNOSIS — J309 Allergic rhinitis, unspecified: Secondary | ICD-10-CM

## 2018-05-05 ENCOUNTER — Encounter: Payer: Self-pay | Admitting: Allergy and Immunology

## 2018-05-05 ENCOUNTER — Ambulatory Visit: Payer: Self-pay | Admitting: *Deleted

## 2018-05-05 ENCOUNTER — Other Ambulatory Visit: Payer: Self-pay

## 2018-05-05 ENCOUNTER — Ambulatory Visit: Payer: Medicaid Other | Admitting: Podiatry

## 2018-05-05 ENCOUNTER — Ambulatory Visit: Payer: Medicaid Other | Admitting: Allergy and Immunology

## 2018-05-05 VITALS — BP 132/80 | HR 80 | Resp 16

## 2018-05-05 DIAGNOSIS — K219 Gastro-esophageal reflux disease without esophagitis: Secondary | ICD-10-CM

## 2018-05-05 DIAGNOSIS — J3089 Other allergic rhinitis: Secondary | ICD-10-CM

## 2018-05-05 DIAGNOSIS — H1045 Other chronic allergic conjunctivitis: Secondary | ICD-10-CM

## 2018-05-05 DIAGNOSIS — H1013 Acute atopic conjunctivitis, bilateral: Secondary | ICD-10-CM

## 2018-05-05 DIAGNOSIS — H6982 Other specified disorders of Eustachian tube, left ear: Secondary | ICD-10-CM

## 2018-05-05 DIAGNOSIS — J4540 Moderate persistent asthma, uncomplicated: Secondary | ICD-10-CM | POA: Diagnosis not present

## 2018-05-05 MED ORDER — BUDESONIDE 32 MCG/ACT NA SUSP: 2.0000 | Freq: Every day | NASAL | 3 refills | Status: DC

## 2018-05-05 NOTE — Patient Instructions (Addendum)
  1. Continue Immunotherapy and Epi-Pen / Auvi-Q  2. Continue to Treat and prevent inflammation:   A. Asmanex 200 HFA - 2 inhalations 0ne time per day (replaces Symbicort)  B. OTC Rhinocort - 1 spray each nostril 3 times a week (replaces Flonase)  C. montelukast 10 mg - one tablet one time per day  D. Nucala injections every 4 weeks  3. Continue to Treat and prevent reflux:   A. pantoprazole 40 mg in a.m.  4. If needed:   A. Patanase 2 sprays each nostril two times per day  B. OTC antihistamine - Zyrtec 10mg  1-2 times per day  C. Proventil HFA or similar 2 inhalations every 4-6 hours  5. Continue eye therapy with Dr. Delman Cheadle.  6. Return in 12 weeks or earlier if problem

## 2018-05-05 NOTE — Progress Notes (Signed)
Follow-up Note  Referring Provider: Eustaquio Maize, MD Primary Provider: Eustaquio Maize, MD Date of Office Visit: 05/05/2018  Subjective:   Jennifer Mullins (DOB: 06-18-1971) is a 47 y.o. female who returns to the [facility] [place] on 05/05/2018 in re-evaluation of the following:  HPI: Celestine returns to this clinic in reevaluation of asthma and allergic rhinitis and allergic conjunctivitis with component of verbal conjunctivitis and ETD and LPR.  I last saw her in this clinic 10 February 2018.  She is undergoing a course of immunotherapy and is having some problems progressing because of large local reactions.  She does use Zyrtec the night before she receives her immunotherapy.  She has not had any systemic reactions.  She still continues to have waxing and waning nasal congestion and stuffiness and ear popping.  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  She continues to use Patanase on a regular basis as well as a leukotriene modifier.  She continues on topical cromolyn for her conjunctivitis as directed by her ophthalmologist and overall she feels as though she is doing very well with this current therapy.  Asthma has basically been nonexistent.  She is using Symbicort and she does not like to use this medication because it makes her jittery and nervous.  Rarely does use a short acting bronchodilator and she can exert herself now with no difficulty at all.  She continues on mepolizumab injections.  Her reflux is really doing quite well and she has not had any significant issues involving her stomach or her throat.  She continues to use a proton pump inhibitor every day.  Allergies as of 05/05/2018      Reactions   Demerol [meperidine] Shortness Of Breath   FLUSHING AND SHORTNESS OF BREATH   Morphine And Related  Shortness Of Breath   Flushed and hot hyper Flushed and hot hyper Flushed and hot hyper Flushed and hot hyper   Diltiazem Hcl Hives   Lisinopril    Caused cough   Lyrica [pregabalin]    Chest pain, elevated heart rate   Tramadol Other (See Comments)   Tears up stomach      Medication List      albuterol (2.5 MG/3ML) 0.083% nebulizer solution Commonly known as:  PROVENTIL Take 2.5 mg by nebulization every 6 (six) hours as needed for wheezing or shortness of breath.   PROAIR HFA 108 (90 Base) MCG/ACT inhaler Generic drug:  albuterol INHALE 1-2 PUFFS EVERY 4 HOURS AS NEEDED WHEEZING OR SHORTNESS OF BREATH   ALPRAZolam 0.5 MG tablet Commonly known as:  XANAX Take 1 tab twice a day as needed. May take one more tab before allergy shots once a week.   aspirin 81 MG chewable tablet Chew 81 mg by mouth daily.   AUVI-Q 0.3 mg/0.3 mL Soaj injection Generic drug:  EPINEPHrine Use as directed for life-threatening allergic reaction.   cetirizine 10 MG tablet Commonly known as:  ZYRTEC Take 10 mg by mouth as needed (With shots).   cromolyn 4 % ophthalmic solution Commonly known as:  OPTICROM Place 1 drop into both eyes 4 (four) times daily.   cycloSPORINE 0.05 % ophthalmic emulsion Commonly known as:  RESTASIS Place 1 drop into both eyes 2 (two) times daily.   diclofenac 0.1 % ophthalmic solution Commonly known as:  VOLTAREN Place 1 drop into both eyes 3 (three) times daily.   DULoxetine 20 MG capsule Commonly known as:  CYMBALTA Take 1 capsule (20 mg total) by mouth daily.   EPIDUO 0.1-2.5 % gel Generic drug:  Adapalene-Benzoyl Peroxide AFFECTED AREA(S) ON FACE AND CHEST EVERY NIGHT AT BEDTIME   fluticasone 50 MCG/ACT nasal spray Commonly known as:  FLONASE Place 1 spray into both nostrils daily as needed for allergies or rhinitis.   furosemide 20 MG tablet Commonly known as:  LASIX TAKE ONE TABLET BY MOUTH AS NEEDED for fluid   HYDROcodone-acetaminophen 5-325 MG  tablet Commonly known as:  NORCO/VICODIN Take 1-2 tablets by mouth every 4 (four) hours as needed.   IRON PO Take 1 tablet by mouth daily as needed (for low hemoglobin levels). Take when hemoglobin is low   LINZESS 290 MCG Caps capsule Generic drug:  linaclotide TAKE ONE CAPSULE BY MOUTH EVERY DAY BEFORE BREAKFAST   losartan 25 MG tablet Commonly known as:  COZAAR Take 1 tablet (25 mg total) by mouth 2 (two) times daily.   medroxyPROGESTERone 10 MG tablet Commonly known as:  PROVERA Take 1 tablet (10 mg total) by mouth daily.   metFORMIN 500 MG tablet Commonly known as:  GLUCOPHAGE Take 1 tablet (500 mg total) by mouth daily with breakfast.   montelukast 10 MG tablet Commonly known as:  SINGULAIR TAKE 1 TABLET BY MOUTH AT BEDTIME   NUCALA [place] Inject into the skin every 28 (twenty-eight) days.   Olopatadine HCl 0.6 % Soln Place 2 sprays into both nostrils 2 (two) times daily.   pantoprazole 40 MG tablet Commonly known as:  PROTONIX TAKE ONE TABLET BY MOUTH DAILY   potassium chloride SA 20 MEQ tablet Commonly known as:  K-DUR,KLOR-CON TAKE TWO TABLETS BY MOUTH DAILY FOR 3 DAYS, THEN TAKE 1 TABLET BY MOUTH DAILY WHILE TAKING LASIX   pravastatin 20 MG tablet Commonly known as:  PRAVACHOL Take 1 tablet (20 mg total) by mouth daily.   SM COMPLETE ADVANCED FORMULA Tabs Take by mouth.   SYMBICORT 160-4.5 MCG/ACT inhaler Generic drug:  budesonide-formoterol INHALE TWO puffs TWICE DAILY   VANIQA 13.9 % cream Generic drug:  Eflornithine HCl APPLY TO AFFECTED AREA TWICE DAILY   VOLTAREN 1 % Gel Generic drug:  diclofenac sodium APPLY TWO GRAMS TOPICALLY FOUR TIMES DAILY       Past Medical History:  Diagnosis Date  . Anemia   . Anemia   . Anxiety   . Arthritis   . Asthma   . Bilateral calcaneal spurs   . Bilateral polycystic ovarian syndrome   . Bulging lumbar disc   . Carpal tunnel syndrome, bilateral   . Conjunctivitis   . COPD (chronic obstructive  pulmonary disease) (HCC)    BRONCHITIS  . Diabetes mellitus without complication ([place])    TYPE 2   DX  4-5 YRS AGO  . ETD (eustachian tube dysfunction)   . ETD (Eustachian tube dysfunction), bilateral    left worse than right  . Fibroids    uterine  . GAD (generalized anxiety disorder)   . GERD (gastroesophageal reflux disease)    TAKES PRESCRIPTION MEDS  . Glaucoma    BOTH EYES  . Hematoma    Hematoma of Left Hand- had to wear a split  . Hyperlipidemia   . Hypertension   . Hypothyroidism    NODULES ON THYROID  . PONV (postoperative nausea and vomiting)   . Pulmonary embolism ([place])   . Tachycardia   .  TMJ (dislocation of temporomandibular joint)   . Trigger finger   . Trigger finger of left thumb     Past Surgical History:  Procedure Laterality Date  . ANTERIOR CERVICAL DECOMP/DISCECTOMY FUSION N/A 11/13/2016   Procedure: Cervical five-six, Cervical six-seven Anterior Cervical Discectomy and Fusion, Allograft, Plate;  Surgeon: Marybelle Killings, MD;  Location: [facility];  Service: Orthopedics;  Laterality: N/A;  . BREAST SURGERY    . CARPAL TUNNEL RELEASE    . DILATION AND CURETTAGE OF UTERUS    . EXPLORATORY LAPAROTOMY    . NASAL SINUS SURGERY  07/22/13   Dr. Redmond Pulling in [place]    Review of systems negative except as noted in HPI / PMHx or noted below:  Review of Systems  Constitutional: Negative.   HENT: Negative.   Eyes: Negative.   Respiratory: Negative.   Cardiovascular: Negative.   Gastrointestinal: Negative.   Genitourinary: Negative.   Musculoskeletal: Negative.   Skin: Negative.   Neurological: Negative.   Endo/Heme/Allergies: Negative.   Psychiatric/Behavioral: Negative.      Objective:   Vitals:   05/05/18 0817  BP: 132/80  Pulse: 80  Resp: 16  SpO2: 98%          Physical Exam  HENT:  Head: Normocephalic.  Right Ear: Tympanic membrane, external ear and ear canal normal.  Left Ear: Tympanic membrane, external ear and ear canal normal.  Nose: Nose  normal. No mucosal edema or rhinorrhea.  Mouth/Throat: Uvula is midline, oropharynx is clear and moist and mucous membranes are normal. No oropharyngeal exudate.  Eyes: Conjunctivae are normal.  Neck: Trachea normal. No tracheal tenderness present. No tracheal deviation present. No thyromegaly present.  Cardiovascular: Normal rate, regular rhythm, S1 normal, S2 normal and normal heart sounds.  No murmur heard. Pulmonary/Chest: Breath sounds normal. No stridor. No respiratory distress. She has no wheezes. She has no rales.  Musculoskeletal: She exhibits no edema.  Lymphadenopathy:       Head (right side): No tonsillar adenopathy present.       Head (left side): No tonsillar adenopathy present.    She has no cervical adenopathy.  Neurological: She is alert.  Skin: No rash noted. She is not diaphoretic. No erythema. Nails show no clubbing.    Diagnostics:    Spirometry was performed and demonstrated an FEV1 of 1.97 at 88 % of predicted.  The patient had an Asthma Control Test with the following results: ACT Total Score: 18.    Assessment and Plan:   1. Asthma, moderate persistent, well-controlled   2. Other allergic rhinitis   3. Perennial allergic conjunctivitis of both eyes   4. LPRD (laryngopharyngeal reflux disease)   5. ETD (Eustachian tube dysfunction), left     1. Continue Immunotherapy and Epi-Pen / Auvi-Q  2. Continue to Treat and prevent inflammation:   A. Asmanex 200 HFA - 2 inhalations 0ne time per day (replaces Symbicort)  B. OTC Rhinocort - 1 spray each nostril 3 times a week (replaces Flonase)  C. montelukast 10 mg - one tablet one time per day  D. Nucala injections every 4 weeks  3. Continue to Treat and prevent reflux:   A. pantoprazole 40 mg in a.m.  4. If needed:   A. Patanase 2 sprays each nostril two times per day  B. OTC antihistamine - Zyrtec 10mg  1-2 times per day  C. Proventil HFA or similar 2 inhalations every 4-6 hours  5. Continue eye therapy  with Dr. Delman Cheadle.  6. Return in 12  weeks or earlier if problem  Tai appears to be doing okay and we will keep her on a very large collection of medical therapy directed against respiratory tract inflammation and also continue her on immunotherapy and mepolizumab.  I did change her Symbicort to Asmanex as she was developing some side effects with the use of formoterolt and I am also going to have her use Rhinocort instead of Flonase assuming that nasal budesonide will have a better safety profile especially given her history of glaucoma.  She will continue on therapy for reflux as noted above.  I will see her back in this clinic in 3 months or earlier if there is a problem.  Allena Katz, MD Allergy / Immunology [place]

## 2018-05-06 ENCOUNTER — Telehealth: Payer: Self-pay | Admitting: Allergy and Immunology

## 2018-05-06 ENCOUNTER — Other Ambulatory Visit: Payer: Self-pay | Admitting: *Deleted

## 2018-05-06 ENCOUNTER — Encounter: Payer: Self-pay | Admitting: Allergy and Immunology

## 2018-05-06 MED ORDER — FLUCONAZOLE 150 MG PO TABS: 150.0000 mg | ORAL_TABLET | ORAL | 0 refills | Status: DC | PRN

## 2018-05-06 MED ORDER — BUDESONIDE-FORMOTEROL FUMARATE 80-4.5 MCG/ACT IN AERO: 2.0000 | INHALATION_SPRAY | Freq: Two times a day (BID) | RESPIRATORY_TRACT | 5 refills | Status: DC

## 2018-05-06 NOTE — Telephone Encounter (Signed)
Can you call patient re refill of fluconazole?  If she having symptoms of yeast infection and what are the symptoms?

## 2018-05-06 NOTE — Telephone Encounter (Signed)
We can try symbicort 80 2 inhalations 1-2 times per day depending on asthma activity

## 2018-05-06 NOTE — Telephone Encounter (Signed)
Patient has had itching in vagina and on outside for several days.  Same symptoms as before with yeast.. Please refill script.

## 2018-05-06 NOTE — Telephone Encounter (Signed)
Left detailed message stating requested rx sent into pharmacy and to call back with any further questions or concerns. 

## 2018-05-06 NOTE — Telephone Encounter (Signed)
Fax received [facility] Drug Request refill Fluconazole 150 mg #2

## 2018-05-06 NOTE — Telephone Encounter (Signed)
Patient was seen yesterday and given Asmanex. She said when she used it, it made her wheeze more. She tried it a little later and it made her wheeze again, like an asthma attack. She stated she cannot use the purple disc either; this has left her with a very sore throat. She would like to know if Symbicort (lower does) could be sent to [facility].

## 2018-05-06 NOTE — Telephone Encounter (Signed)
I have sent in the prescription and called and spoke with the patient. She also wanted to let you know that she is using the fluticasone 1 spray twice a day and it is really helping her. I asked her to call us back if her symptoms are not improving.

## 2018-05-06 NOTE — Telephone Encounter (Signed)
Dr. Kozlow will you please advise?  °

## 2018-05-07 NOTE — Telephone Encounter (Signed)
Please inform if it's allowed for me to call the pharmacy and change her directions to 1 puff 1-2 times daily. Pt is upset that the directions state 2 puffs 1-2 days. She states she can't tolerate that much medication.

## 2018-05-07 NOTE — Telephone Encounter (Signed)
We will need to try Pulmicort 160 - 2 inhalations 1 times a day, or Flovent 110 - 2 inhalations 1 times per day. Has she tried these medications in the past and will the insurance pay for theses medications.

## 2018-05-07 NOTE — Telephone Encounter (Signed)
Patient is calling wanting the Flonase to say 1 spary per nostril 3x a week. She would like to stay on flonase.

## 2018-05-07 NOTE — Telephone Encounter (Signed)
We will need to try Pulmicort 160 - 2 inhalations 1 times a day, or Flovent 110 - 2 inhalations 1 times per day. Has she tried these medications in the past and will the insurance pay for theses medications. Obviously we need to change her inhaled medication as she CAN NOT tolerate Symbicort.  OK to write flonase prescription as 1-2 sprays each nostril 3-7 times per week.

## 2018-05-07 NOTE — Telephone Encounter (Signed)
Pt. Called today stating the Symbicort sent in for 50mcg 2 puffs twice daily is still something she can't tolerate. She states she needs 1 puff twice daily. She states if she goes back to the hospital they're going to go by what the order says on the script and she can't tolerate that amount.

## 2018-05-08 ENCOUNTER — Telehealth: Payer: Self-pay | Admitting: Allergy and Immunology

## 2018-05-08 MED ORDER — FLUTICASONE PROPIONATE 50 MCG/ACT NA SUSP: NASAL | 5 refills | Status: DC

## 2018-05-08 MED ORDER — BUDESONIDE-FORMOTEROL FUMARATE 80-4.5 MCG/ACT IN AERO: INHALATION_SPRAY | RESPIRATORY_TRACT | 5 refills | Status: DC

## 2018-05-08 NOTE — Telephone Encounter (Signed)
Pt called back about her meds. Symbicort is 1 to 2 puffs once daily. Flonase 1 spray each nostril 3 times a week. She said if it is not wright this way medicaid will not pay. [phone].

## 2018-05-08 NOTE — Telephone Encounter (Signed)
Please see previous telephone encounter.

## 2018-05-08 NOTE — Telephone Encounter (Signed)
I spoke with patient and she became very irritated on the phone, vocalizing very loudly that she did not like changing back and forth between medications. I tried to explain the instructions and recommendations that Dr. Neldon Mc had but was unable to finish. She told me that she was worried about if she had to be hospitalized they would make her use the Symbicort 80 mcg too much and cause more problems. She denied symptoms at this time. She continued to be very loud so I informed her that I was trying to assist her however I could but to please stop yelling at me. Patient apologized and I let her know that I did understand, however she was getting very loud with me and I was only trying to get her what she needed. I spoke with A. Ambs, FNP and she okayed it to send in the Symbicort 80 mcg 1-2 inhalations 1-2 times daily. Patient thanked me after this and apologized again and explained to me that she just had some stuff going on with her husband. I let her know that I understood and to let me know if there was anything else I could do for her. I am sending new prescription in for Symbicort 80 mcg 1-2 inhalations by mouth 1-2 times daily. Patient also requested the fluticasone rx be sent in as it had previously with 1-2 sprays in each nostril 3 times weekly as needed.

## 2018-05-14 ENCOUNTER — Ambulatory Visit (INDEPENDENT_AMBULATORY_CARE_PROVIDER_SITE_OTHER): Payer: Medicaid Other | Admitting: *Deleted

## 2018-05-14 DIAGNOSIS — J309 Allergic rhinitis, unspecified: Secondary | ICD-10-CM

## 2018-05-15 ENCOUNTER — Ambulatory Visit: Payer: Medicaid Other | Admitting: Obstetrics & Gynecology

## 2018-05-18 ENCOUNTER — Ambulatory Visit: Payer: Medicaid Other | Admitting: *Deleted

## 2018-05-18 DIAGNOSIS — J4550 Severe persistent asthma, uncomplicated: Secondary | ICD-10-CM

## 2018-05-20 ENCOUNTER — Ambulatory Visit (INDEPENDENT_AMBULATORY_CARE_PROVIDER_SITE_OTHER): Payer: Medicaid Other

## 2018-05-20 ENCOUNTER — Telehealth: Payer: Self-pay | Admitting: Pediatrics

## 2018-05-20 ENCOUNTER — Ambulatory Visit: Payer: Medicaid Other | Admitting: Neurology

## 2018-05-20 DIAGNOSIS — J309 Allergic rhinitis, unspecified: Secondary | ICD-10-CM | POA: Diagnosis not present

## 2018-05-20 NOTE — Telephone Encounter (Signed)
Aware. Pharmacy to send refill request . PCP out on vacation.

## 2018-05-22 ENCOUNTER — Telehealth (INDEPENDENT_AMBULATORY_CARE_PROVIDER_SITE_OTHER): Payer: Self-pay | Admitting: Orthopaedic Surgery

## 2018-05-22 NOTE — Telephone Encounter (Signed)
Patient would like to be worked in on 7/16 either morning or afternoon - she has bilateral thumb pain due to trigger finger, she said every day they are stuck and both hands numb, also left arm pain.  CB # [phone]

## 2018-05-25 NOTE — Telephone Encounter (Signed)
Patient called again this morning requesting to be worked in on July 16.  I offered several other days and also offered to transfer her to the [place] office, no other days would work for her schedule.  Please advise.  Thank you.

## 2018-05-26 ENCOUNTER — Ambulatory Visit (INDEPENDENT_AMBULATORY_CARE_PROVIDER_SITE_OTHER): Payer: Medicaid Other | Admitting: *Deleted

## 2018-05-26 DIAGNOSIS — J309 Allergic rhinitis, unspecified: Secondary | ICD-10-CM

## 2018-05-26 NOTE — Telephone Encounter (Signed)
For transportation reasons, patient would like a call back to discuss this appt that she needs open and she would need to know something today to get someone to bring her. # [phone]

## 2018-05-26 NOTE — Telephone Encounter (Signed)
Patient called again.  I asked Ailene Ravel to explain to patient that I have been working in clinic yesterday and today. As of right now, the schedule is completely full for that day and Dr. Lorin Mercy is on practice call, [facility] call, and [facility] call.  I am waiting to see if there is a cancellation that I could put her in. However, at this time, he has nothing available that day.

## 2018-05-28 ENCOUNTER — Other Ambulatory Visit: Payer: Self-pay | Admitting: Allergy and Immunology

## 2018-05-28 DIAGNOSIS — J4540 Moderate persistent asthma, uncomplicated: Secondary | ICD-10-CM

## 2018-05-28 NOTE — Telephone Encounter (Signed)
Patient has scheduled appt with Dr. Lorin Mercy in [place] office on 06/04/18.

## 2018-06-01 ENCOUNTER — Ambulatory Visit (INDEPENDENT_AMBULATORY_CARE_PROVIDER_SITE_OTHER): Payer: Medicaid Other | Admitting: *Deleted

## 2018-06-01 DIAGNOSIS — J309 Allergic rhinitis, unspecified: Secondary | ICD-10-CM

## 2018-06-04 ENCOUNTER — Encounter (INDEPENDENT_AMBULATORY_CARE_PROVIDER_SITE_OTHER): Payer: Self-pay | Admitting: Orthopaedic Surgery

## 2018-06-04 ENCOUNTER — Ambulatory Visit (INDEPENDENT_AMBULATORY_CARE_PROVIDER_SITE_OTHER): Payer: Medicaid Other | Admitting: Orthopaedic Surgery

## 2018-06-04 ENCOUNTER — Ambulatory Visit: Payer: Medicaid Other | Admitting: Podiatry

## 2018-06-04 VITALS — BP 127/59 | HR 61 | Ht 62.0 in | Wt 149.0 lb

## 2018-06-04 DIAGNOSIS — M65311 Trigger thumb, right thumb: Secondary | ICD-10-CM | POA: Diagnosis not present

## 2018-06-04 DIAGNOSIS — D1724 Benign lipomatous neoplasm of skin and subcutaneous tissue of left leg: Secondary | ICD-10-CM

## 2018-06-04 NOTE — Progress Notes (Signed)
Office Visit Note   Patient: Jennifer Mullins           Date of Birth: 1970/11/23           MRN: [id number] Visit Date: 06/04/2018              Requested by: Eustaquio Maize, MD [place], [place] [address] PCP: Eustaquio Maize, MD   Assessment & Plan: Visit Diagnoses:  1. Trigger thumb, right thumb   2. 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  She is had enlargement of her lipoma on her left leg.  She like to proceed with excision of the painful lipoma left leg and also right trigger thumb release.  At some point in future she may require left trigger thumb and index release as well but currently her right thumb is giving her the most problems and she is right-handed.  Follow-Up Instructions: No follow-ups on file.   Orders:  No orders of the defined types were placed in this encounter.  No orders of the defined types were placed in this encounter.     Procedures: No procedures performed   Clinical Data: No additional findings.   Subjective: Chief Complaint  Patient presents with  . Hand Pain    RIGHT THUMB AND LEFT INDEX    HPI 47 year old female returns with ongoing problems with bilateral trigger thumb worse on the right than left.  She states she is also had some left index finger triggering but not as severe.  She is had previous injections in both thumbs.  Recurrent triggering with activities of daily living.  She also has had problems with a enlarging lipoma in the left anterior medial leg below the pes bursa region.  Previous MRI scan 2013 showed this was consistent with a lipoma which was 2 x 2 x 1 cm in size which is enlarged and doubled in size.  It bothers her at times when she bumps it or puts pressure on it with pain.  She states she like to have it  removed.  Review of Systems 10 point review of systems updated unchanged from 09/25/2017 other than as mentioned in HPI.  Past history of treatment for DVT with pulmonary embolism long-term Eliquis and now currently is just on aspirin.   Objective: Vital Signs: BP (!) 127/59   Pulse 61   Ht 5\' 2"  (1.575 m)   Wt 149 lb (67.6 kg)   BMI 27.25 kg/m   Physical Exam  Constitutional: She is oriented to person, place, and time. She appears well-developed.  HENT:  Head: Normocephalic.  Right Ear: External ear normal.  Left Ear: External ear normal.  Eyes: Pupils are equal, round, and reactive to light.  Neck: No tracheal deviation present. No thyromegaly present.  Cardiovascular: Normal rate.  Pulmonary/Chest: Effort normal.  Abdominal: Soft.  Neurological: She is alert and oriented to person, place, and time.  Skin: Skin is warm and dry.  Psychiatric: She has a normal mood and affect. Her behavior is normal.  Positive for late expiratory wheezing.  No dyspnea.  Ortho Exam patient demonstrates locking of her right trigger thumb.  Specialty Comments:  No specialty comments available.  Imaging: No results found.   PMFS History: Patient Active Problem List   Diagnosis Date Noted  .  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 Pelvic pain in female 12/11/2017  . Dyspareunia, female 12/11/2017  . Menometrorrhagia 12/11/2017  . Pain in right ankle and joints of right foot 10/29/2017  . Chronic pain of right knee 10/29/2017  . Right elbow pain 10/29/2017  . Iron deficiency anemia due to chronic blood loss 07/07/2017  . S/P cervical spinal fusion 02/27/2017  . Dysfunctional uterine bleeding 11/24/2016  . Anticoagulated 11/24/2016  . Microcytic anemia 11/24/2016  . Tachycardia   . Vaginal bleeding   . Allergic rhinoconjunctivitis 07/28/2015  . Chronic headache 07/28/2015  . PCOS (polycystic ovarian syndrome) 06/20/2015  . 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  Hyperlipidemia LDL goal <100 06/20/2015  . GAD (generalized anxiety disorder) 05/18/2014  . GERD (gastroesophageal reflux disease) 05/18/2014  . Body fluid retention 07/07/2013  . Chronic sinusitis 07/02/2013  . Extrinsic asthma 05/20/2013  . HTN (hypertension) 05/20/2013  . Prediabetes 05/20/2013   Past Medical History:  Diagnosis Date  . Anemia   . Anemia   . Anxiety   . Arthritis   . Asthma   . Bilateral calcaneal spurs   . Bilateral polycystic ovarian syndrome   . Bulging lumbar disc   . Carpal tunnel syndrome, bilateral   . Conjunctivitis   . COPD (chronic obstructive pulmonary disease) (HCC)    BRONCHITIS  . Diabetes mellitus without complication ([place])    TYPE 2   DX  4-5 YRS AGO  . ETD (eustachian tube dysfunction)   . ETD (Eustachian tube dysfunction), bilateral    left worse than right  . Fibroids    uterine  . GAD (generalized anxiety disorder)   . GERD (gastroesophageal reflux disease)    TAKES PRESCRIPTION MEDS  . Glaucoma    BOTH EYES  . Hematoma    Hematoma of Left Hand- had to wear a split  . Hyperlipidemia   . Hypertension   . Hypothyroidism    NODULES ON THYROID  . PONV (postoperative nausea and vomiting)   . Pulmonary embolism ([facility])   . Tachycardia   . TMJ (dislocation of temporomandibular joint)   . Trigger finger   . Trigger finger of left thumb     Family History  Problem Relation Age of Onset  . Hypertension Mother   . Hypertension Father     Past Surgical History:  Procedure Laterality Date  . ANTERIOR CERVICAL DECOMP/DISCECTOMY FUSION N/A 11/13/2016   Procedure: Cervical five-six, Cervical six-seven Anterior Cervical Discectomy and Fusion, Allograft, Plate;  Surgeon: Marybelle Killings, MD;  Location: [facility];  Service: Orthopedics;  Laterality: N/A;  . BREAST SURGERY    . CARPAL TUNNEL RELEASE    . DILATION AND CURETTAGE OF UTERUS    . EXPLORATORY LAPAROTOMY    . NASAL SINUS SURGERY  07/22/13   Dr. Redmond Pulling in [place] History  . Occupation: Unemployed  Tobacco Use  . Smoking status: Former Smoker    Packs/day: 0.25    Years: 5.00    Pack years: 1.25    Types: Cigarettes    Last attempt to quit: 11/18/1989    Years since quitting: 28.5  . Smokeless tobacco: Never Used  Substance and Sexual Activity  . Alcohol use: No  . Drug use: No  . Sexual activity: Yes    Birth control/protection: None

## 2018-06-05 ENCOUNTER — Encounter: Payer: Self-pay | Admitting: Allergy and Immunology

## 2018-06-07 ENCOUNTER — Encounter (INDEPENDENT_AMBULATORY_CARE_PROVIDER_SITE_OTHER): Payer: Self-pay | Admitting: Orthopaedic Surgery

## 2018-06-08 ENCOUNTER — Ambulatory Visit (INDEPENDENT_AMBULATORY_CARE_PROVIDER_SITE_OTHER): Payer: Medicaid Other

## 2018-06-08 DIAGNOSIS — J309 Allergic rhinitis, unspecified: Secondary | ICD-10-CM

## 2018-06-10 ENCOUNTER — Encounter: Payer: Self-pay | Admitting: *Deleted

## 2018-06-10 ENCOUNTER — Other Ambulatory Visit: Payer: Self-pay | Admitting: *Deleted

## 2018-06-10 DIAGNOSIS — R609 Edema, unspecified: Secondary | ICD-10-CM

## 2018-06-10 MED ORDER — POTASSIUM CHLORIDE CRYS ER 20 MEQ PO TBCR: EXTENDED_RELEASE_TABLET | ORAL | 1 refills | Status: DC

## 2018-06-10 MED ORDER — FUROSEMIDE 20 MG PO TABS: ORAL_TABLET | ORAL | 1 refills | Status: DC

## 2018-06-10 NOTE — Telephone Encounter (Signed)
Aware of refills.

## 2018-06-11 ENCOUNTER — Telehealth (INDEPENDENT_AMBULATORY_CARE_PROVIDER_SITE_OTHER): Payer: Self-pay | Admitting: Radiology

## 2018-06-11 ENCOUNTER — Other Ambulatory Visit: Payer: Self-pay | Admitting: *Deleted

## 2018-06-11 MED ORDER — BUDESONIDE-FORMOTEROL FUMARATE 80-4.5 MCG/ACT IN AERO: INHALATION_SPRAY | RESPIRATORY_TRACT | 5 refills | Status: DC

## 2018-06-11 NOTE — Telephone Encounter (Signed)
Patient called the office wanting to check on surgery scheduling. She states that she has not received a call and has called the [place] office several times. I explained to the patient that one of our surgery schedulers is out of the office this week and the other two are working diligently to take care of all of the practice patients.  I also explained that we may be awaiting insurance approval and until we get that, she cannot have the surgery or will have to pay out of pocket.    Could you please let me know where we are in the process of scheduling surgery and I would be glad to call her back.

## 2018-06-12 ENCOUNTER — Encounter (HOSPITAL_BASED_OUTPATIENT_CLINIC_OR_DEPARTMENT_OTHER): Payer: Self-pay | Admitting: *Deleted

## 2018-06-12 ENCOUNTER — Telehealth (INDEPENDENT_AMBULATORY_CARE_PROVIDER_SITE_OTHER): Payer: Self-pay | Admitting: Radiology

## 2018-06-12 NOTE — Telephone Encounter (Signed)
I have already s/w patient

## 2018-06-12 NOTE — Telephone Encounter (Signed)
Patient called stating she was returning your call regarding her surgery scheduled for 06/15/18 with Dr. Lorin Mercy.  If you are unable to reach her on her home # please call [phone]

## 2018-06-15 ENCOUNTER — Encounter (HOSPITAL_BASED_OUTPATIENT_CLINIC_OR_DEPARTMENT_OTHER)
Admission: RE | Disposition: A | Discharge status: Home / Self Care | Payer: Self-pay | Source: Ambulatory Visit | Attending: Orthopaedic Surgery

## 2018-06-15 ENCOUNTER — Ambulatory Visit: Payer: Medicaid Other | Admitting: Obstetrics & Gynecology

## 2018-06-15 ENCOUNTER — Other Ambulatory Visit: Payer: Self-pay

## 2018-06-15 ENCOUNTER — Ambulatory Visit: Payer: Medicaid Other

## 2018-06-15 ENCOUNTER — Ambulatory Visit (HOSPITAL_BASED_OUTPATIENT_CLINIC_OR_DEPARTMENT_OTHER): Payer: Medicaid Other | Admitting: Anesthesiology

## 2018-06-15 ENCOUNTER — Encounter (HOSPITAL_BASED_OUTPATIENT_CLINIC_OR_DEPARTMENT_OTHER): Payer: Self-pay | Admitting: Anesthesiology

## 2018-06-15 ENCOUNTER — Ambulatory Visit (HOSPITAL_COMMUNITY)
Admission: RE | Admit: 2018-06-15 | Discharge: 2018-06-15 | Disposition: A | Discharge status: Home / Self Care | Payer: Medicaid Other | Source: Ambulatory Visit | Attending: Orthopaedic Surgery | Admitting: Orthopaedic Surgery

## 2018-06-15 DIAGNOSIS — Z981 Arthrodesis status: Secondary | ICD-10-CM | POA: Insufficient documentation

## 2018-06-15 DIAGNOSIS — M1990 Unspecified osteoarthritis, unspecified site: Secondary | ICD-10-CM | POA: Diagnosis not present

## 2018-06-15 DIAGNOSIS — E119 Type 2 diabetes mellitus without complications: Secondary | ICD-10-CM | POA: Insufficient documentation

## 2018-06-15 DIAGNOSIS — K219 Gastro-esophageal reflux disease without esophagitis: Secondary | ICD-10-CM | POA: Diagnosis not present

## 2018-06-15 DIAGNOSIS — F419 Anxiety disorder, unspecified: Secondary | ICD-10-CM | POA: Diagnosis not present

## 2018-06-15 DIAGNOSIS — E039 Hypothyroidism, unspecified: Secondary | ICD-10-CM | POA: Insufficient documentation

## 2018-06-15 DIAGNOSIS — M65311 Trigger thumb, right thumb: Secondary | ICD-10-CM | POA: Insufficient documentation

## 2018-06-15 DIAGNOSIS — Z888 Allergy status to other drugs, medicaments and biological substances status: Secondary | ICD-10-CM | POA: Insufficient documentation

## 2018-06-15 DIAGNOSIS — Z79899 Other long term (current) drug therapy: Secondary | ICD-10-CM | POA: Diagnosis not present

## 2018-06-15 DIAGNOSIS — E282 Polycystic ovarian syndrome: Secondary | ICD-10-CM | POA: Insufficient documentation

## 2018-06-15 DIAGNOSIS — Z86711 Personal history of pulmonary embolism: Secondary | ICD-10-CM | POA: Diagnosis not present

## 2018-06-15 DIAGNOSIS — D1724 Benign lipomatous neoplasm of skin and subcutaneous tissue of left leg: Secondary | ICD-10-CM | POA: Insufficient documentation

## 2018-06-15 DIAGNOSIS — Z885 Allergy status to narcotic agent status: Secondary | ICD-10-CM | POA: Diagnosis not present

## 2018-06-15 DIAGNOSIS — J449 Chronic obstructive pulmonary disease, unspecified: Secondary | ICD-10-CM | POA: Diagnosis not present

## 2018-06-15 DIAGNOSIS — E785 Hyperlipidemia, unspecified: Secondary | ICD-10-CM | POA: Diagnosis not present

## 2018-06-15 DIAGNOSIS — Z7984 Long term (current) use of oral hypoglycemic drugs: Secondary | ICD-10-CM | POA: Diagnosis not present

## 2018-06-15 DIAGNOSIS — Z87891 Personal history of nicotine dependence: Secondary | ICD-10-CM | POA: Insufficient documentation

## 2018-06-15 DIAGNOSIS — F411 Generalized anxiety disorder: Secondary | ICD-10-CM | POA: Diagnosis not present

## 2018-06-15 DIAGNOSIS — I10 Essential (primary) hypertension: Secondary | ICD-10-CM | POA: Insufficient documentation

## 2018-06-15 DIAGNOSIS — D1720 Benign lipomatous neoplasm of skin and subcutaneous tissue of unspecified limb: Secondary | ICD-10-CM

## 2018-06-15 HISTORY — PX: LIPOMA EXCISION: SHX5283

## 2018-06-15 HISTORY — PX: TRIGGER FINGER RELEASE: SHX641

## 2018-06-15 LAB — POCT I-STAT, CHEM 8
BUN: 5 mg/dL — ABNORMAL LOW (ref 6–20)
Calcium, Ion: 1.15 mmol/L (ref 1.15–1.40)
Chloride: 101 mmol/L (ref 98–111)
Creatinine, Ser: 0.6 mg/dL (ref 0.44–1.00)
Glucose, Bld: 87 mg/dL (ref 70–99)
HCT: 33 % — ABNORMAL LOW (ref 36.0–46.0)
Hemoglobin: 11.2 g/dL — ABNORMAL LOW (ref 12.0–15.0)
Potassium: 3.7 mmol/L (ref 3.5–5.1)
Sodium: 139 mmol/L (ref 135–145)
TCO2: 25 mmol/L (ref 22–32)

## 2018-06-15 LAB — GLUCOSE, CAPILLARY: Glucose-Capillary: 81 mg/dL (ref 70–99)

## 2018-06-15 SURGERY — RELEASE, A1 PULLEY, FOR TRIGGER FINGER
Anesthesia: General | Site: Hand | Laterality: Right

## 2018-06-15 MED ORDER — ONDANSETRON HCL 4 MG/2ML IJ SOLN
INTRAMUSCULAR | Status: DC | PRN
  Administered 2018-06-15: 4 mg via INTRAVENOUS

## 2018-06-15 MED ORDER — LACTATED RINGERS IV SOLN
INTRAVENOUS | Status: DC
  Administered 2018-06-15: 10:00:00 via INTRAVENOUS

## 2018-06-15 MED ORDER — CEFAZOLIN SODIUM-DEXTROSE 2-4 GM/100ML-% IV SOLN
INTRAVENOUS | Status: AC
  Filled 2018-06-15: qty 100

## 2018-06-15 MED ORDER — BUPIVACAINE HCL (PF) 0.25 % IJ SOLN
INTRAMUSCULAR | Status: DC | PRN
  Administered 2018-06-15: 2 mL

## 2018-06-15 MED ORDER — CEFAZOLIN SODIUM-DEXTROSE 2-4 GM/100ML-% IV SOLN
2.0000 g | INTRAVENOUS | Status: AC
  Administered 2018-06-15: 2 g via INTRAVENOUS

## 2018-06-15 MED ORDER — SCOPOLAMINE 1 MG/3DAYS TD PT72: 1.0000 | MEDICATED_PATCH | Freq: Once | TRANSDERMAL | Status: DC | PRN

## 2018-06-15 MED ORDER — FENTANYL CITRATE (PF) 100 MCG/2ML IJ SOLN
50.0000 ug | INTRAMUSCULAR | Status: DC | PRN
  Administered 2018-06-15: 100 ug via INTRAVENOUS

## 2018-06-15 MED ORDER — MIDAZOLAM HCL 2 MG/2ML IJ SOLN
INTRAMUSCULAR | Status: AC
  Filled 2018-06-15: qty 2

## 2018-06-15 MED ORDER — DEXAMETHASONE SODIUM PHOSPHATE 4 MG/ML IJ SOLN
INTRAMUSCULAR | Status: DC | PRN
  Administered 2018-06-15: 5 mg via INTRAVENOUS

## 2018-06-15 MED ORDER — PROPOFOL 10 MG/ML IV BOLUS
INTRAVENOUS | Status: DC | PRN
  Administered 2018-06-15: 180 mg via INTRAVENOUS

## 2018-06-15 MED ORDER — FENTANYL CITRATE (PF) 100 MCG/2ML IJ SOLN
INTRAMUSCULAR | Status: AC
  Filled 2018-06-15: qty 2

## 2018-06-15 MED ORDER — PROMETHAZINE HCL 25 MG/ML IJ SOLN: 6.2500 mg | INTRAMUSCULAR | Status: DC | PRN

## 2018-06-15 MED ORDER — FENTANYL CITRATE (PF) 100 MCG/2ML IJ SOLN
25.0000 ug | INTRAMUSCULAR | Status: DC | PRN
  Administered 2018-06-15: 25 ug via INTRAVENOUS
  Administered 2018-06-15: 50 ug via INTRAVENOUS
  Administered 2018-06-15: 25 ug via INTRAVENOUS

## 2018-06-15 MED ORDER — LIDOCAINE HCL (CARDIAC) PF 100 MG/5ML IV SOSY
PREFILLED_SYRINGE | INTRAVENOUS | Status: AC
  Filled 2018-06-15: qty 5

## 2018-06-15 MED ORDER — BUPIVACAINE HCL (PF) 0.25 % IJ SOLN
INTRAMUSCULAR | Status: DC | PRN
  Administered 2018-06-15: 4 mL

## 2018-06-15 MED ORDER — CHLORHEXIDINE GLUCONATE 4 % EX LIQD: 60.0000 mL | Freq: Once | CUTANEOUS | Status: DC

## 2018-06-15 MED ORDER — OXYCODONE HCL 5 MG PO TABS: 5.0000 mg | ORAL_TABLET | Freq: Once | ORAL | Status: DC | PRN

## 2018-06-15 MED ORDER — OXYCODONE HCL 5 MG/5ML PO SOLN: 5.0000 mg | Freq: Once | ORAL | Status: DC | PRN

## 2018-06-15 MED ORDER — HYDROCODONE-ACETAMINOPHEN 5-325 MG PO TABS: 1.0000 | ORAL_TABLET | Freq: Four times a day (QID) | ORAL | 0 refills | Status: DC | PRN

## 2018-06-15 MED ORDER — ONDANSETRON HCL 4 MG/2ML IJ SOLN
INTRAMUSCULAR | Status: AC
  Filled 2018-06-15: qty 2

## 2018-06-15 MED ORDER — LIDOCAINE 2% (20 MG/ML) 5 ML SYRINGE
INTRAMUSCULAR | Status: DC | PRN
  Administered 2018-06-15: 50 mg via INTRAVENOUS

## 2018-06-15 MED ORDER — MIDAZOLAM HCL 2 MG/2ML IJ SOLN
1.0000 mg | INTRAMUSCULAR | Status: DC | PRN
  Administered 2018-06-15: 2 mg via INTRAVENOUS

## 2018-06-15 MED ORDER — DEXAMETHASONE SODIUM PHOSPHATE 10 MG/ML IJ SOLN
INTRAMUSCULAR | Status: AC
  Filled 2018-06-15: qty 1

## 2018-06-15 SURGICAL SUPPLY — 55 items
BANDAGE ACE 4X5 VEL STRL LF (GAUZE/BANDAGES/DRESSINGS) ×3 IMPLANT
BANDAGE COBAN STERILE 2 (GAUZE/BANDAGES/DRESSINGS) ×2 IMPLANT
BANDAGE ESMARK 6X9 LF (GAUZE/BANDAGES/DRESSINGS) IMPLANT
BLADE SURG 11 STRL SS (BLADE) IMPLANT
BLADE SURG 15 STRL LF DISP TIS (BLADE) ×2 IMPLANT
BLADE SURG 15 STRL SS (BLADE) ×3
BNDG CMPR 9X4 STRL LF SNTH (GAUZE/BANDAGES/DRESSINGS) ×2
BNDG CMPR 9X6 STRL LF SNTH (GAUZE/BANDAGES/DRESSINGS) ×2
BNDG COHESIVE 4X5 TAN STRL (GAUZE/BANDAGES/DRESSINGS) ×1 IMPLANT
BNDG CONFORM 3 STRL LF (GAUZE/BANDAGES/DRESSINGS) ×2 IMPLANT
BNDG ELASTIC 2X5.8 VLCR STR LF (GAUZE/BANDAGES/DRESSINGS) ×1 IMPLANT
BNDG ESMARK 4X9 LF (GAUZE/BANDAGES/DRESSINGS) ×1 IMPLANT
BNDG ESMARK 6X9 LF (GAUZE/BANDAGES/DRESSINGS) ×3
BRUSH SCRUB EZ PLAIN DRY (MISCELLANEOUS) ×2 IMPLANT
CORD BIPOLAR FORCEPS 12FT (ELECTRODE) ×3 IMPLANT
COVER BACK TABLE 60X90IN (DRAPES) ×3 IMPLANT
COVER MAYO STAND STRL (DRAPES) ×3 IMPLANT
CUFF TOURNIQUET SINGLE 18IN (TOURNIQUET CUFF) ×3 IMPLANT
DECANTER SPIKE VIAL GLASS SM (MISCELLANEOUS) IMPLANT
DRAIN PENROSE 1/4X12 LTX STRL (WOUND CARE) ×3 IMPLANT
DRAPE C-ARM 42X72 X-RAY (DRAPES) IMPLANT
DRAPE EXTREMITY T 121X128X90 (DRAPE) ×4 IMPLANT
DRAPE OEC MINIVIEW 54X84 (DRAPES) IMPLANT
DRAPE SURG 17X23 STRL (DRAPES) ×3 IMPLANT
DURAPREP 26ML APPLICATOR (WOUND CARE) ×3 IMPLANT
GAUZE SPONGE 4X4 12PLY STRL LF (GAUZE/BANDAGES/DRESSINGS) ×3 IMPLANT
GAUZE XEROFORM 1X8 LF (GAUZE/BANDAGES/DRESSINGS) ×3 IMPLANT
GLOVE BIO SURGEON STRL SZ 6.5 (GLOVE) ×1 IMPLANT
GLOVE BIOGEL PI IND STRL 7.0 (GLOVE) IMPLANT
GLOVE BIOGEL PI IND STRL 8 (GLOVE) ×2 IMPLANT
GLOVE BIOGEL PI INDICATOR 7.0 (GLOVE) ×2
GLOVE BIOGEL PI INDICATOR 8 (GLOVE) ×1
GLOVE ORTHO TXT STRL SZ7.5 (GLOVE) ×4 IMPLANT
GOWN STRL REUS W/ TWL LRG LVL3 (GOWN DISPOSABLE) ×4 IMPLANT
GOWN STRL REUS W/TWL LRG LVL3 (GOWN DISPOSABLE) ×6
NDL HYPO 25X1 1.5 SAFETY (NEEDLE) ×2 IMPLANT
NEEDLE HYPO 25X1 1.5 SAFETY (NEEDLE) ×3 IMPLANT
NS IRRIG 1000ML POUR BTL (IV SOLUTION) ×3 IMPLANT
PACK BASIN DAY SURGERY FS (CUSTOM PROCEDURE TRAY) ×3 IMPLANT
PAD CAST 3X4 CTTN HI CHSV (CAST SUPPLIES) ×2 IMPLANT
PADDING CAST ABS 4INX4YD NS (CAST SUPPLIES) ×1
PADDING CAST ABS COTTON 4X4 ST (CAST SUPPLIES) ×2 IMPLANT
PADDING CAST COTTON 3X4 STRL (CAST SUPPLIES) ×3
SHEET MEDIUM DRAPE 40X70 STRL (DRAPES) IMPLANT
SPLINT PLASTER CAST XFAST 3X15 (CAST SUPPLIES) IMPLANT
SPLINT PLASTER XTRA FASTSET 3X (CAST SUPPLIES)
STOCKINETTE 4X48 STRL (DRAPES) ×3 IMPLANT
STOCKINETTE IMPERVIOUS LG (DRAPES) ×1 IMPLANT
SUT ETHILON 4 0 PS 2 18 (SUTURE) ×5 IMPLANT
SUT ETHILON 5 0 PS 2 18 (SUTURE) ×3 IMPLANT
SUT ETHILON 6 0 P 1 (SUTURE) ×2 IMPLANT
SYR BULB 3OZ (MISCELLANEOUS) ×3 IMPLANT
SYR CONTROL 10ML LL (SYRINGE) ×3 IMPLANT
TOWEL GREEN STERILE FF (TOWEL DISPOSABLE) ×6 IMPLANT
UNDERPAD 30X30 (UNDERPADS AND DIAPERS) ×3 IMPLANT

## 2018-06-15 NOTE — Interval H&P Note (Signed)
History and Physical Interval Note:  06/15/2018 9:37 AM  [place]  has presented today for surgery, with the diagnosis of RIGHT TRIGGER THUMB,  CALF LIPOMA  The various methods of treatment have been discussed with the patient and family. After consideration of risks, benefits and other options for treatment, the patient has consented to  Procedure(s): RIGHT TRIGGER THUMB RELEASE (Right) LEFT CALF LIPOMA EXCISION (Left) as a surgical intervention .  The patient's history has been reviewed, patient examined, no change in status, stable for surgery.  I have reviewed the patient's chart and labs.  Questions were answered to the patient's satisfaction.     Marybelle Killings

## 2018-06-15 NOTE — Anesthesia Preprocedure Evaluation (Addendum)
Anesthesia Evaluation  Patient identified by MRN, date of birth, ID band Patient awake    Reviewed: Allergy & Precautions, NPO status , Patient's Chart, lab work & pertinent test results  History of Anesthesia Complications (+) PONVNegative for: history of anesthetic complications  Airway Mallampati: II  TM Distance: >3 FB Neck ROM: Limited    Dental  (+) Dental Advisory Given, Teeth Intact   Pulmonary asthma , COPD,  COPD inhaler, former smoker, PE   breath sounds clear to auscultation       Cardiovascular hypertension, Pt. on medications  Rhythm:Regular Rate:Normal     Neuro/Psych  Headaches, Anxiety  S/p ACDF     GI/Hepatic Neg liver ROS, GERD  Controlled,  Endo/Other  diabetes, Type 2, Oral Hypoglycemic AgentsHypothyroidism   Renal/GU negative Renal ROS     Musculoskeletal  (+) Arthritis ,  TMJ Dysfunction    Abdominal   Peds  Hematology  (+) anemia ,   Anesthesia Other Findings   Reproductive/Obstetrics  PCOS                             Anesthesia Physical Anesthesia Plan  ASA: III  Anesthesia Plan: General   Post-op Pain Management:    Induction:   PONV Risk Score and Plan: 4 or greater and Treatment may vary due to age or medical condition, Ondansetron, Dexamethasone, Scopolamine patch - Pre-op and Midazolam  Airway Management Planned: LMA and Oral ETT  Additional Equipment: None  Intra-op Plan:   Post-operative Plan: Extubation in OR  Informed Consent: I have reviewed the patients History and Physical, chart, labs and discussed the procedure including the risks, benefits and alternatives for the proposed anesthesia with the patient or authorized representative who has indicated his/her understanding and acceptance.   Dental advisory given  Plan Discussed with: CRNA and Anesthesiologist  Anesthesia Plan Comments: (LMA ok if lateral position. If going prone,  ETT.)       Anesthesia Quick Evaluation

## 2018-06-15 NOTE — Op Note (Signed)
Preop diagnosis: Right trigger thumb, left lower leg lipoma  Postop diagnosis: Same  Procedure: Right thumb A1 pulley release for triggering, excision of left lower leg lipoma 3 x 4 x 2 cm.  Surgeon: Rodell Perna, MD  Anesthesia: General  Tourniquet: 250 x 6 minutes right upper extremity.  250 x 6 minutes left thigh.  Specimen: Lipoma left leg.  Procedure: After standard prepping and draping for the right upper extremity with a forearm tourniquet timeout procedure was completed stockinette extremity sheets and drapes were applied.  And was elevated tourniquet inflated.  Skin marker was used and Ancef had been given prophylactically.  Transverse incision over the palpable nodule at the A1 pulley was performed.  Blunt spreading the midline areas were patient had previous cortisone injection caused some scarring.  Directly down over the A1 pulley right now retractors were placed on each side protecting the neurovascular bundle.  A1 pulley was nicked with a scalpel extended with Stevens scissors proximally distally until there is no longer any triggering.  Careful inspection make sure there was complete release.  Wound was irrigated and two 4-0 nylon sutures were placed followed by Marcaine and postop dressing with 4 x 4's and Ace wrap.  Left lower extremity was prepped impervious stockinette Coban and and extremity sheets and drapes were applied.  Leg had been signed preoperatively.  Oblique incision was drawn 5 cm leg was elevated wrapped with an Esmarch tourniquet inflated.  Skin was open subtenons tissue pocket was developed and lipoma was encased and had one large 3 x 4 cm piece and then 2 smaller  lobulated 1 x 1 cm weighings.  Extended down to the fascia and periosteum.  It was easily shelled out from its casing and removed for specimen.  Bipolar cautery was used tourniquet was deflated hemostasis obtained.  Reapproximation subtenons tissue 3-0 Vicryl.  4-0 Vicryl subcuticular closure Marcaine  infiltration 4 cc in the leg and 2 cc was used for the right hand.  4 x 4's web roll and Ace wrap were applied to the left leg for postop dressing patient tolerated procedure well was transferred recovery in stable condition.

## 2018-06-15 NOTE — Anesthesia Postprocedure Evaluation (Signed)
Anesthesia Post Note  Patient: KEYARAH MCROY  Procedure(s) Performed: RIGHT TRIGGER THUMB RELEASE (Right Hand) LEFT CALF LIPOMA EXCISION (Left )     Patient location during evaluation: PACU Anesthesia Type: General Level of consciousness: awake and alert Pain management: pain level controlled Vital Signs Assessment: post-procedure vital signs reviewed and stable Respiratory status: spontaneous breathing, nonlabored ventilation and respiratory function stable Cardiovascular status: blood pressure returned to baseline and stable Postop Assessment: no apparent nausea or vomiting Anesthetic complications: no    Last Vitals:  Vitals:   06/15/18 1245 06/15/18 1317  BP: 127/80 123/83  Pulse: 84 76  Resp: 19 18  Temp:  36.7 C  SpO2: 100% 100%    Last Pain:  Vitals:   06/15/18 1317  TempSrc:   PainSc: 2                  Audry Pili

## 2018-06-15 NOTE — Transfer of Care (Signed)
Immediate Anesthesia Transfer of Care Note  Patient: Jennifer Mullins  Procedure(s) Performed: RIGHT TRIGGER THUMB RELEASE (Right Hand) LEFT CALF LIPOMA EXCISION (Left )  Patient Location: PACU  Anesthesia Type:General  Level of Consciousness: awake and sedated  Airway & Oxygen Therapy: Patient Spontanous Breathing and Patient connected to face mask oxygen  Post-op Assessment: Report given to RN and Post -op Vital signs reviewed and stable  Post vital signs: Reviewed and stable  Last Vitals:  Vitals Value Taken Time  BP 122/78 06/15/2018 11:04 AM  Temp    Pulse 78 06/15/2018 11:04 AM  Resp 15 06/15/2018 11:04 AM  SpO2 100 % 06/15/2018 11:04 AM  Vitals shown include unvalidated device data.  Last Pain:  Vitals:   06/15/18 0926  TempSrc: Oral  PainSc: 7          Complications: No apparent anesthesia complications

## 2018-06-15 NOTE — H&P (Signed)
History and PE  Patient: Jennifer Mullins                                          Date of Birth: August 27, 1971                                                    MRN: [id number] Visit Date: 06/04/2018                                                                     Requested by: Eustaquio Maize, MD [place], [place] [address] PCP: Eustaquio Maize, MD   Assessment & Plan: Visit Diagnoses:  1. Trigger thumb, right thumb   2. 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  She is had enlargement of her lipoma on her left leg.  She like to proceed with excision of the painful lipoma left leg and also right trigger thumb release.  At some point in future she may require left trigger thumb and index release as well but currently her right thumb is giving her the most problems and she is right-handed.  Follow-Up Instructions: No follow-ups on file.   Orders:  No orders of the defined types were placed in this encounter.  No orders of the defined types were placed in this encounter.     Procedures: No procedures performed   Clinical Data: No additional findings.   Subjective:     Chief Complaint  Patient presents with  . Hand Pain    RIGHT THUMB AND LEFT INDEX    HPI 47 year old female returns with ongoing problems with bilateral trigger thumb worse on the right than left.  She states she is also had some left index finger triggering but not as severe.  She is had previous injections in both thumbs.  Recurrent triggering with activities of daily living.  She also has had problems with a enlarging lipoma in the left anterior medial leg below the pes bursa region.  Previous MRI scan 2013 showed this was consistent with a lipoma which was 2 x 2 x 1 cm in size which is enlarged and  doubled in size.  It bothers her at times when she bumps it or puts pressure on it with pain.  She states she like to have it removed.  Review of Systems 10 point review of systems updated unchanged from 09/25/2017 other than as mentioned in HPI.  Past history of treatment for DVT with pulmonary embolism long-term Eliquis and now currently is just on aspirin.   Objective: Vital Signs: BP (!) 127/59   Pulse 61   Ht 5\' 2"  (1.575 m)   Wt 149 lb (67.6 kg)   BMI 27.25 kg/m   Physical Exam  Constitutional: She is oriented to person, place, and time.  She appears well-developed.  HENT:  Head: Normocephalic.  Right Ear: External ear normal.  Left Ear: External ear normal.  Eyes: Pupils are equal, round, and reactive to light.  Neck: No tracheal deviation present. No thyromegaly present.  Cardiovascular: Normal rate.  Pulmonary/Chest: Effort normal.  Abdominal: Soft.  Neurological: She is alert and oriented to person, place, and time.  Skin: Skin is warm and dry.  Psychiatric: She has a normal mood and affect. Her behavior is normal.  Positive for late expiratory wheezing.  No dyspnea.  Ortho Exam patient demonstrates locking of her right trigger thumb.  Specialty Comments:  No specialty comments available.  Imaging: No results found.   PMFS History:     Patient Active Problem List   Diagnosis Date Noted  . 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 Pelvic pain in female 12/11/2017  . Dyspareunia, female 12/11/2017  . Menometrorrhagia 12/11/2017  . Pain in right ankle and joints of right foot 10/29/2017  . Chronic pain of right knee 10/29/2017  . Right elbow pain 10/29/2017  . Iron deficiency anemia due to chronic blood loss 07/07/2017  . S/P cervical spinal fusion 02/27/2017  . Dysfunctional uterine bleeding 11/24/2016  . Anticoagulated 11/24/2016  . Microcytic anemia 11/24/2016  . Tachycardia   . Vaginal bleeding   . Allergic rhinoconjunctivitis 07/28/2015   . Chronic headache 07/28/2015  . PCOS (polycystic ovarian syndrome) 06/20/2015  . 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 Hyperlipidemia LDL goal <100 06/20/2015  . GAD (generalized anxiety disorder) 05/18/2014  . GERD (gastroesophageal reflux disease) 05/18/2014  . Body fluid retention 07/07/2013  . Chronic sinusitis 07/02/2013  . Extrinsic asthma 05/20/2013  . HTN (hypertension) 05/20/2013  . Prediabetes 05/20/2013       Past Medical History:  Diagnosis Date  . Anemia   . Anemia   . Anxiety   . Arthritis   . Asthma   . Bilateral calcaneal spurs   . Bilateral polycystic ovarian syndrome   . Bulging lumbar disc   . Carpal tunnel syndrome, bilateral   . Conjunctivitis   . COPD (chronic obstructive pulmonary disease) (HCC)    BRONCHITIS  . Diabetes mellitus without complication ([facility])    TYPE 2   DX  4-5 YRS AGO  . ETD (eustachian tube dysfunction)   . ETD (Eustachian tube dysfunction), bilateral    left worse than right  . Fibroids    uterine  . GAD (generalized anxiety disorder)   . GERD (gastroesophageal reflux disease)    TAKES PRESCRIPTION MEDS  . Glaucoma    BOTH EYES  . Hematoma    Hematoma of Left Hand- had to wear a split  . Hyperlipidemia   . Hypertension   . Hypothyroidism    NODULES ON THYROID  . PONV (postoperative nausea and vomiting)   . Pulmonary embolism ([facility])   . Tachycardia   . TMJ (dislocation of temporomandibular joint)   . Trigger finger   . Trigger finger of left thumb          Family History  Problem Relation Age of Onset  . Hypertension Mother   . Hypertension Father          Past Surgical History:  Procedure Laterality Date  . ANTERIOR CERVICAL DECOMP/DISCECTOMY FUSION N/A 11/13/2016   Procedure: Cervical five-six, Cervical six-seven Anterior Cervical Discectomy and Fusion, Allograft, Plate;  Surgeon: Marybelle Killings, MD;  Location: [facility];  Service: Orthopedics;  Laterality: N/A;  . BREAST  SURGERY    . CARPAL  TUNNEL RELEASE    . DILATION AND CURETTAGE OF UTERUS    . EXPLORATORY LAPAROTOMY    . NASAL SINUS SURGERY  07/22/13   Dr. Redmond Pulling in [place] History  . Occupation: Unemployed  Tobacco Use  . Smoking status: Former Smoker    Packs/day: 0.25    Years: 5.00    Pack years: 1.25    Types: Cigarettes    Last attempt to quit: 11/18/1989    Years since quitting: 28.5  . Smokeless tobacco: Never Used  Substance and Sexual Activity  . Alcohol use: No  . Drug use: No  . Sexual activity: Yes    Birth control/protection: None                Electronically signed by Marybelle Killings, MD at 06/07/2018 6:15 PM

## 2018-06-15 NOTE — Discharge Instructions (Signed)
Office followup one week. Use ice on and off for pain. Elevate hand and leg above heart to decrease pain. Leave dressing on for 2 days then can apply band aid to right thumb and rewrap leg with ace wrap after shower.      Post Anesthesia Home Care Instructions  Activity: Get plenty of rest for the remainder of the day. A responsible individual must stay with you for 24 hours following the procedure.  For the next 24 hours, DO NOT: -Drive a car -Paediatric nurse -Drink alcoholic beverages -Take any medication unless instructed by your physician -Make any legal decisions or sign important papers.  Meals: Start with liquid foods such as gelatin or soup. Progress to regular foods as tolerated. Avoid greasy, spicy, heavy foods. If nausea and/or vomiting occur, drink only clear liquids until the nausea and/or vomiting subsides. Call your physician if vomiting continues.  Special Instructions/Symptoms: Your throat may feel dry or sore from the anesthesia or the breathing tube placed in your throat during surgery. If this causes discomfort, gargle with warm salt water. The discomfort should disappear within 24 hours.  If you had a scopolamine patch placed behind your ear for the management of post- operative nausea and/or vomiting:  1. The medication in the patch is effective for 72 hours, after which it should be removed.  Wrap patch in a tissue and discard in the trash. Wash hands thoroughly with soap and water. 2. You may remove the patch earlier than 72 hours if you experience unpleasant side effects which may include dry mouth, dizziness or visual disturbances. 3. Avoid touching the patch. Wash your hands with soap and water after contact with the patch.

## 2018-06-15 NOTE — Anesthesia Procedure Notes (Signed)
Procedure Name: LMA Insertion Performed by: Pearson, Donna W, CRNA Pre-anesthesia Checklist: Patient identified, Emergency Drugs available, Suction available and Patient being monitored Patient Re-evaluated:Patient Re-evaluated prior to induction Oxygen Delivery Method: Circle system utilized Preoxygenation: Pre-oxygenation with 100% oxygen Induction Type: IV induction Ventilation: Mask ventilation without difficulty LMA: LMA inserted LMA Size: 4.0 Number of attempts: 1 Placement Confirmation: positive ETCO2 Tube secured with: Tape Dental Injury: Teeth and Oropharynx as per pre-operative assessment        

## 2018-06-16 ENCOUNTER — Encounter (HOSPITAL_BASED_OUTPATIENT_CLINIC_OR_DEPARTMENT_OTHER): Payer: Self-pay | Admitting: Orthopaedic Surgery

## 2018-06-16 ENCOUNTER — Ambulatory Visit: Payer: Medicaid Other | Admitting: Podiatry

## 2018-06-16 ENCOUNTER — Telehealth (INDEPENDENT_AMBULATORY_CARE_PROVIDER_SITE_OTHER): Payer: Self-pay | Admitting: Orthopaedic Surgery

## 2018-06-16 NOTE — Telephone Encounter (Signed)
error 

## 2018-06-16 NOTE — Telephone Encounter (Signed)
Patient aware of the below message  

## 2018-06-16 NOTE — Telephone Encounter (Signed)
Patient called wanting to know when she takes the ace bandage off to take the dressing off and apply the band aid, does she need to leave the ace bandage off or reapply it.  Please advise patient.  CB#[phone].  Thank you.

## 2018-06-16 NOTE — Telephone Encounter (Signed)
06/15/18  Right Trigger Thumb Release   Pt called would like to know if she can receive allergy shot this Friday due to recent surgery

## 2018-06-16 NOTE — Telephone Encounter (Signed)
See below, please advise.

## 2018-06-16 NOTE — Telephone Encounter (Signed)
Ok to remove dressing on hand and put band aid. Ok for allergy shot.

## 2018-06-18 ENCOUNTER — Ambulatory Visit (INDEPENDENT_AMBULATORY_CARE_PROVIDER_SITE_OTHER): Payer: Medicaid Other | Admitting: *Deleted

## 2018-06-18 DIAGNOSIS — J309 Allergic rhinitis, unspecified: Secondary | ICD-10-CM

## 2018-06-22 ENCOUNTER — Other Ambulatory Visit: Payer: Self-pay | Admitting: *Deleted

## 2018-06-22 ENCOUNTER — Ambulatory Visit (INDEPENDENT_AMBULATORY_CARE_PROVIDER_SITE_OTHER): Payer: Medicaid Other | Admitting: *Deleted

## 2018-06-22 DIAGNOSIS — J4550 Severe persistent asthma, uncomplicated: Secondary | ICD-10-CM | POA: Diagnosis not present

## 2018-06-22 MED ORDER — MEPOLIZUMAB 100 MG ~~LOC~~ SOLR
100.0000 mg | SUBCUTANEOUS | Status: DC
  Administered 2018-06-22 – 2021-02-21 (×35): 100 mg via SUBCUTANEOUS

## 2018-06-22 MED ORDER — ALBUTEROL SULFATE (2.5 MG/3ML) 0.083% IN NEBU: 2.5000 mg | INHALATION_SOLUTION | Freq: Four times a day (QID) | RESPIRATORY_TRACT | 1 refills | Status: DC | PRN

## 2018-06-23 ENCOUNTER — Ambulatory Visit (INDEPENDENT_AMBULATORY_CARE_PROVIDER_SITE_OTHER): Payer: Medicaid Other | Admitting: Surgery

## 2018-06-23 ENCOUNTER — Encounter (INDEPENDENT_AMBULATORY_CARE_PROVIDER_SITE_OTHER): Payer: Self-pay | Admitting: Surgery

## 2018-06-23 VITALS — BP 126/78 | HR 87

## 2018-06-23 DIAGNOSIS — M65311 Trigger thumb, right thumb: Secondary | ICD-10-CM | POA: Diagnosis not present

## 2018-06-23 NOTE — Progress Notes (Signed)
Per Jeneen Rinks advised pt not to use any creams or ointment on right hand and thumb only a band-aid and thumb brace that was provided.

## 2018-06-23 NOTE — Progress Notes (Signed)
Office Visit Note   Patient: Jennifer Mullins           Date of Birth: 12-Aug-1971           MRN: [id number] Visit Date: 06/23/2018              Requested by: Eustaquio Maize, MD [place], [place] [address] PCP: Eustaquio Maize, MD   Assessment & Plan: Visit Diagnoses:  1. Trigger thumb, right thumb   Left calf lipoma excision  Plan: Advised patient no aggressive activity.  She was given a removable thumb spica to use.  Work on gentle finger range of motion.  Still continue to elevate her hand and also left foot above heart level to decrease swelling in the lower leg.  I stressed to her the importance of being compliant with my instructions.  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  She voices understanding.  Advised her not to put any creams or ointments on her incisions.  needs follow-up with Dr. Lorin Mercy in 1 week for recheck.  Follow-Up Instructions: Return in about 1 week (around 06/30/2018) for With Dr. Lorin Mercy.   Orders:  No orders of the defined types were placed in this encounter.  No orders of the defined types were placed in this encounter.     Procedures: No procedures performed   Clinical Data: No additional findings.   Subjective: Chief Complaint  Patient presents with  . Left Knee - Pain, Edema  . Right Hand - Pain, Edema    HPI  Review of Systems   Objective: Vital Signs: BP 126/78 (BP Location: Right Arm, Patient Position: Sitting)   Pulse 87   LMP 05/27/2018 Comment: irreg on Provera  Physical Exam  Ortho Exam  Specialty Comments:  No specialty comments available.  Imaging: No results found.   PMFS History: Patient Active Problem List   Diagnosis Date Noted  . Trigger thumb, right thumb 06/15/2018  . Lipoma of lower extremity 06/15/2018  . 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 Pelvic pain in female 12/11/2017  . Dyspareunia, female 12/11/2017  . Menometrorrhagia 12/11/2017  . Pain in right ankle and joints of  right foot 10/29/2017  . Chronic pain of right knee 10/29/2017  . Right elbow pain 10/29/2017  . Iron deficiency anemia due to chronic blood loss 07/07/2017  . S/P cervical spinal fusion 02/27/2017  . Dysfunctional uterine bleeding 11/24/2016  . Anticoagulated 11/24/2016  . Microcytic anemia 11/24/2016  . Tachycardia   . Vaginal bleeding   . Allergic rhinoconjunctivitis 07/28/2015  . Chronic headache 07/28/2015  . PCOS (polycystic ovarian syndrome) 06/20/2015  . 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 Hyperlipidemia LDL goal <100 06/20/2015  . GAD (generalized anxiety disorder) 05/18/2014  . GERD (gastroesophageal reflux disease) 05/18/2014  . Body fluid retention 07/07/2013  . Chronic sinusitis 07/02/2013  . Extrinsic asthma 05/20/2013  . HTN (hypertension) 05/20/2013  . Prediabetes 05/20/2013   Past Medical History:  Diagnosis Date  . Anemia   . Anemia   . Anxiety   . Arthritis   . Asthma   . Bilateral calcaneal spurs   . Bilateral polycystic ovarian syndrome   . Bulging lumbar disc   . Carpal tunnel syndrome, bilateral   . Conjunctivitis   . COPD (chronic obstructive pulmonary disease) (HCC)    BRONCHITIS  . Diabetes mellitus without complication ([place])    TYPE 2   DX  4-5 YRS AGO  . ETD (eustachian tube dysfunction)   .  ETD (Eustachian tube dysfunction), bilateral    left worse than right  . Fibroids    uterine  . GAD (generalized anxiety disorder)   . GERD (gastroesophageal reflux disease)    TAKES PRESCRIPTION MEDS  . Glaucoma    BOTH EYES  . Hematoma    Hematoma of Left Hand- had to wear a split  . Hyperlipidemia   . Hypertension   . Hypothyroidism    NODULES ON THYROID  . PONV (postoperative nausea and vomiting)   . Pulmonary embolism ([facility])   . Tachycardia   . TMJ (dislocation of temporomandibular joint)   . Trigger finger   . Trigger finger of left thumb     Family History  Problem Relation Age of Onset  . Hypertension Mother   .  Hypertension Father     Past Surgical History:  Procedure Laterality Date  . ANTERIOR CERVICAL DECOMP/DISCECTOMY FUSION N/A 11/13/2016   Procedure: Cervical five-six, Cervical six-seven Anterior Cervical Discectomy and Fusion, Allograft, Plate;  Surgeon: Marybelle Killings, MD;  Location: [facility];  Service: Orthopedics;  Laterality: N/A;  . BREAST SURGERY    . CARPAL TUNNEL RELEASE    . DILATION AND CURETTAGE OF UTERUS    . EXPLORATORY LAPAROTOMY    . LIPOMA EXCISION Left 06/15/2018   Procedure: LEFT CALF LIPOMA EXCISION;  Surgeon: Marybelle Killings, MD;  Location: [facility];  Service: Orthopedics;  Laterality: Left;  . NASAL SINUS SURGERY  07/22/13   Dr. Redmond Pulling in [place]  . TRIGGER FINGER RELEASE Right 06/15/2018   Procedure: RIGHT TRIGGER THUMB RELEASE;  Surgeon: Marybelle Killings, MD;  Location: [facility];  Service: Orthopedics;  Laterality: Right;   Social History   Occupational History  . Occupation: Unemployed  Tobacco Use  . Smoking status: Former Smoker    Packs/day: 0.25    Years: 5.00    Pack years: 1.25    Types: Cigarettes    Last attempt to quit: 11/18/1989    Years since quitting: 28.6  . Smokeless tobacco: Never Used  Substance and Sexual Activity  . Alcohol use: No  . Drug use: No  . Sexual activity: Yes    Birth control/protection: None

## 2018-06-24 ENCOUNTER — Telehealth (INDEPENDENT_AMBULATORY_CARE_PROVIDER_SITE_OTHER): Payer: Self-pay | Admitting: Orthopaedic Surgery

## 2018-06-24 ENCOUNTER — Telehealth: Payer: Self-pay | Admitting: Pediatrics

## 2018-06-24 ENCOUNTER — Other Ambulatory Visit: Payer: Self-pay

## 2018-06-24 MED ORDER — ALBUTEROL SULFATE (2.5 MG/3ML) 0.083% IN NEBU: 2.5000 mg | INHALATION_SOLUTION | Freq: Four times a day (QID) | RESPIRATORY_TRACT | 0 refills | Status: DC | PRN

## 2018-06-24 NOTE — Telephone Encounter (Signed)
I called and spoke with patient.  She is requesting some type of brace to cover her lower leg where the steri strips are. She states that she is concerned with her pants rubbing the incision.  I explained that we do not have any type of brace to cover her leg. She explained that she was thinking of something elastic. I explained again that we do not have anything for her. I did advise she could cover with an ace wrap when she is putting her pants on and off, but that we advise her not to use a compression device. Jeneen Rinks has explained to her that he wants her to keep her leg elevated to keep the swelling down.  She asked if I would put an ace wrap at the front for her to pick up. I advised I would.  Patient then asked what she should do about her Aspirin.  She has been on Aspirin 81mg  daily routinely to prevent DVT. She actually missed her doses on Monday and Tuesday and took 2 81mg  Aspirin yesterday. She asks if Jeneen Rinks would like for her to take 2 a day until follow up with Dr. Lorin Mercy. Per verbal instructions from Jeneen Rinks, patient needs to pick up Aspirin 325mg  and take one daily. She asks if she could just take 2 of the ones she has. I advised that Jeneen Rinks wants her to take the 325mg . She was trying to figure out how many 81mg  aspirin she could take. I advised her she would need to speak with the pharmacist to see what is equivalent. She decides to buy the Aspirin 325mg  and take one daily until follow up with Dr. Lorin Mercy.   Ace wrap at front desk for patient to pick up.

## 2018-06-24 NOTE — Telephone Encounter (Signed)
Please advise what you would like to do for this patient. She was requesting a brace to cover her incision on anterior tib/fib.

## 2018-06-24 NOTE — Telephone Encounter (Signed)
Patient walked in requesting a brace or some type of dressing that will cover her leg at night and when wearing pants.

## 2018-06-24 NOTE — Telephone Encounter (Signed)
PT states the albuterol (PROVENTIL) (2.5 MG/3ML) 0.083% nebulizer solution that was sent in was sent in for 15 days but needed to be sent for 90 days. [facility] is cancelling out the 15 day supply and wanting [place] to send over a new rx for 90 day supply to [facility]

## 2018-06-24 NOTE — Telephone Encounter (Signed)
Resent for a 90 day supply per pharmacy request

## 2018-06-25 ENCOUNTER — Telehealth: Payer: Self-pay | Admitting: Pediatrics

## 2018-06-25 ENCOUNTER — Other Ambulatory Visit: Payer: Self-pay | Admitting: *Deleted

## 2018-06-25 ENCOUNTER — Ambulatory Visit (INDEPENDENT_AMBULATORY_CARE_PROVIDER_SITE_OTHER): Payer: Medicaid Other | Admitting: *Deleted

## 2018-06-25 DIAGNOSIS — J309 Allergic rhinitis, unspecified: Secondary | ICD-10-CM

## 2018-06-25 MED ORDER — ALBUTEROL SULFATE (2.5 MG/3ML) 0.083% IN NEBU: 2.5000 mg | INHALATION_SOLUTION | Freq: Four times a day (QID) | RESPIRATORY_TRACT | 0 refills | Status: DC | PRN

## 2018-06-25 NOTE — Telephone Encounter (Signed)
Patient aware that the only appointment Dr., Evette Doffing has tomorrow is 11:30. Patient states she will call back tomorrow.

## 2018-06-25 NOTE — Telephone Encounter (Signed)
Aware 90 day supply sent to pharmacy (1080 ml)

## 2018-06-25 NOTE — Telephone Encounter (Signed)
I spoke with patient and she was very upset because we only had the one opening with Dr. Evette Doffing tomorrow and she was unable to come at that time. Patient stated that Dr. Evette Doffing should work her in. Patient then went on to complain about [place] not reading out faxes and that we needed to read our faxes about her medication and that I needed to get Dr. Evette Doffing to refill her medication now. At this time the fax had not been received. Patient was rude and disrespectful to the front staff and myself when talking with her.

## 2018-06-25 NOTE — Telephone Encounter (Signed)
Patient called in stating that we need to read are faxes she was upset that no one would answer. Also she stated she needed to see Evette Doffing ASAP offered her her only appt at 10:30 on 06/26/18, patient refused and said make her appt at night with her or this Saturday Explained to patient she would not be working then and she kept asking for an after hours appt with her when she was not working. Patient was getting very upset. She asked to be transferred to a nurse because she knew a nurse could work her in. Put message in for nurse. Patient then later called back had it not been 10 min and asked for a nurse about her refill because no one up here was reading their faxes and she wanted to speak with the nurse she had spoken with. Put patient on mute to skype the nurse and patient was saying negative things about me and the practice and fellow employees not doing our job. Transferred to the nurse.

## 2018-06-26 NOTE — Telephone Encounter (Signed)
Pt was offered same day appt and alternate times for appts with alternate provider for times of her preference.

## 2018-06-27 ENCOUNTER — Ambulatory Visit: Payer: Medicaid Other | Admitting: Physician Assistant

## 2018-06-27 ENCOUNTER — Encounter: Payer: Self-pay | Admitting: Physician Assistant

## 2018-06-27 VITALS — BP 127/75 | HR 98 | Temp 98.6°F | Ht 62.0 in | Wt 150.8 lb

## 2018-06-27 DIAGNOSIS — L03116 Cellulitis of left lower limb: Secondary | ICD-10-CM | POA: Diagnosis not present

## 2018-06-27 MED ORDER — CEPHALEXIN 500 MG PO CAPS: 500.0000 mg | ORAL_CAPSULE | Freq: Four times a day (QID) | ORAL | 0 refills | Status: DC

## 2018-06-27 NOTE — Patient Instructions (Signed)

## 2018-06-27 NOTE — Progress Notes (Signed)
BP 127/75   Pulse 98   Temp 98.6 F (37 C) (Oral)   Ht 5\' 2"  (1.575 m)   Wt 150 lb 12.8 oz (68.4 kg)   BMI 27.58 kg/m    Subjective:    Patient ID: Jennifer Mullins, female    DOB: [birth year], 47 y.o.   MRN: [id number]  HPI: Jennifer Mullins is a 47 y.o. female presenting on 06/27/2018 for Leg Pain  About 1 week ago the patient had surgery to remove a lipoma from just below her left knee.  In the past couple of days it has had significant swelling.  It is actually more swollen than even when she had a lipoma.  She denies any fever or chills.  She has had some odor she thinks.  Her husband could smell it also.   Past Medical History:  Diagnosis Date  . Anemia   . Anemia   . Anxiety   . Arthritis   . Asthma   . Bilateral calcaneal spurs   . Bilateral polycystic ovarian syndrome   . Bulging lumbar disc   . Carpal tunnel syndrome, bilateral   . Conjunctivitis   . COPD (chronic obstructive pulmonary disease) (HCC)    BRONCHITIS  . Diabetes mellitus without complication ([place])    TYPE 2   DX  4-5 YRS AGO  . ETD (eustachian tube dysfunction)   . ETD (Eustachian tube dysfunction), bilateral    left worse than right  . Fibroids    uterine  . GAD (generalized anxiety disorder)   . GERD (gastroesophageal reflux disease)    TAKES PRESCRIPTION MEDS  . Glaucoma    BOTH EYES  . Hematoma    Hematoma of Left Hand- had to wear a split  . Hyperlipidemia   . Hypertension   . Hypothyroidism    NODULES ON THYROID  . PONV (postoperative nausea and vomiting)   . Pulmonary embolism ([facility])   . Tachycardia   . TMJ (dislocation of temporomandibular joint)   . Trigger finger   . Trigger finger of left thumb    Relevant past medical, surgical, family and social history reviewed and updated as indicated. Interim medical history since our last visit reviewed. Allergies and medications reviewed and updated. DATA REVIEWED: CHART IN EPIC  Family History reviewed for pertinent  findings.  Review of Systems  Constitutional: Negative.   HENT: Negative.   Eyes: Negative.   Respiratory: Negative.   Gastrointestinal: Negative.   Genitourinary: Negative.   Skin: Positive for color change and wound.    Allergies as of 06/27/2018      Reactions   Demerol [meperidine] Shortness Of Breath   FLUSHING AND SHORTNESS OF BREATH   Morphine And Related Shortness Of Breath   Flushed and hot hyper Flushed and hot hyper Flushed and hot hyper Flushed and hot hyper   Diltiazem Hcl Hives   Lisinopril    Caused cough   Lyrica [pregabalin]    Chest pain, elevated heart rate   Tramadol Other (See Comments)   Tears up stomach      Medication List        Accurate as of 06/27/18  9:41 AM. Always use your most recent med list.          albuterol (2.5 MG/3ML) 0.083% nebulizer solution Commonly known as:  PROVENTIL Take 3 mLs (2.5 mg total) by nebulization every 6 (six) hours as needed for wheezing or shortness of breath.   ALPRAZolam 0.5 MG tablet Commonly  known as:  XANAX Take 1 tab twice a day as needed. May take one more tab before allergy shots once a week.   aspirin 81 MG chewable tablet Chew 81 mg by mouth daily.   AUVI-Q 0.3 mg/0.3 mL Soaj injection Generic drug:  EPINEPHrine Use as directed for life-threatening allergic reaction.   baclofen 10 MG tablet Commonly known as:  LIORESAL Take 10 mg by mouth 3 (three) times daily as needed.   budesonide-formoterol 80-4.5 MCG/ACT inhaler Commonly known as:  SYMBICORT 1-2 inhalations by mouth 1 times daily.   cephALEXin 500 MG capsule Commonly known as:  KEFLEX Take 1 capsule (500 mg total) by mouth 4 (four) times daily.   cetirizine 10 MG tablet Commonly known as:  ZYRTEC Take 10 mg by mouth as needed (With shots).   cromolyn 4 % ophthalmic solution Commonly known as:  OPTICROM Place 1 drop into both eyes 4 (four) times daily.   cycloSPORINE 0.05 % ophthalmic emulsion Commonly known as:   RESTASIS Place 1 drop into both eyes 2 (two) times daily.   diclofenac 0.1 % ophthalmic solution Commonly known as:  VOLTAREN Place 1 drop into both eyes 3 (three) times daily.   Diethylpropion HCl CR 75 MG Tb24 TK 1 T PO QD   DULoxetine 20 MG capsule Commonly known as:  CYMBALTA Take 1 capsule (20 mg total) by mouth daily.   EPIDUO 0.1-2.5 % gel Generic drug:  Adapalene-Benzoyl Peroxide AFFECTED AREA(S) ON FACE AND CHEST EVERY NIGHT AT BEDTIME   fluticasone 50 MCG/ACT nasal spray Commonly known as:  FLONASE 1-2 sprays in each nostril 3 times weekly as needed.   furosemide 20 MG tablet Commonly known as:  LASIX TAKE ONE TABLET BY MOUTH AS NEEDED for fluid   HYDROcodone-acetaminophen 7.5-325 MG tablet Commonly known as:  NORCO Take 1 tablet by mouth 4 (four) times daily as needed.   IRON PO Take 1 tablet by mouth daily as needed (for low hemoglobin levels). Take when hemoglobin is low   LINZESS 290 MCG Caps capsule Generic drug:  linaclotide TAKE ONE CAPSULE BY MOUTH EVERY DAY BEFORE BREAKFAST   losartan 25 MG tablet Commonly known as:  COZAAR Take 1 tablet (25 mg total) by mouth 2 (two) times daily.   medroxyPROGESTERone 10 MG tablet Commonly known as:  PROVERA Take 1 tablet (10 mg total) by mouth daily.   metFORMIN 500 MG tablet Commonly known as:  GLUCOPHAGE Take 1 tablet (500 mg total) by mouth daily with breakfast.   montelukast 10 MG tablet Commonly known as:  SINGULAIR TAKE ONE TABLET BY MOUTH AT BEDTIME   NUCALA [place] Inject into the skin every 28 (twenty-eight) days.   Olopatadine HCl 0.6 % Soln Place 2 sprays into both nostrils 2 (two) times daily.   pantoprazole 40 MG tablet Commonly known as:  PROTONIX TAKE ONE TABLET BY MOUTH DAILY   potassium chloride SA 20 MEQ tablet Commonly known as:  K-DUR,KLOR-CON TAKE TWO TABLETS BY MOUTH DAILY FOR 3 DAYS, THEN TAKE 1 TABLET BY MOUTH DAILY WHILE TAKING LASIX   pravastatin 20 MG tablet Commonly known  as:  PRAVACHOL Take 1 tablet (20 mg total) by mouth daily.   SM COMPLETE ADVANCED FORMULA Tabs Take by mouth.   VANIQA 13.9 % cream Generic drug:  Eflornithine HCl APPLY TO AFFECTED AREA TWICE DAILY   VOLTAREN 1 % Gel Generic drug:  diclofenac sodium APPLY TWO GRAMS TOPICALLY FOUR TIMES DAILY  Objective:    BP 127/75   Pulse 98   Temp 98.6 F (37 C) (Oral)   Ht 5\' 2"  (1.575 m)   Wt 150 lb 12.8 oz (68.4 kg)   BMI 27.58 kg/m   Allergies  Allergen Reactions  . Demerol [Meperidine] Shortness Of Breath    FLUSHING AND SHORTNESS OF BREATH  . Morphine And Related Shortness Of Breath    Flushed and hot hyper Flushed and hot hyper Flushed and hot hyper Flushed and hot hyper  . Diltiazem Hcl Hives  . Lisinopril     Caused cough  . Lyrica [Pregabalin]     Chest pain, elevated heart rate  . Tramadol Other (See Comments)    Tears up stomach    Wt Readings from Last 3 Encounters:  06/27/18 150 lb 12.8 oz (68.4 kg)  06/15/18 150 lb (68 kg)  06/04/18 149 lb (67.6 kg)    Physical Exam  Constitutional: She appears well-developed and well-nourished. No distress.  Eyes: Pupils are equal, round, and reactive to light. EOM are normal.  Cardiovascular: Normal rate.  Pulmonary/Chest: Effort normal.  Skin: Lesion noted.     Surgical wound for lipoma removal on left leg below the knee.  There is a fair amount of swelling and tenderness around the wound.  It is approximately 3 cm wide 5 cm long.  There is no drainage.  The Steri-Strips are still in place.  She does have a follow-up this week with her surgeon.    Results for orders placed or performed during the hospital encounter of 06/15/18  Glucose, capillary  Result Value Ref Range   Glucose-Capillary 81 70 - 99 mg/dL  I-STAT, chem 8  Result Value Ref Range   Sodium 139 135 - 145 mmol/L   Potassium 3.7 3.5 - 5.1 mmol/L   Chloride 101 98 - 111 mmol/L   BUN 5 (L) 6 - 20 mg/dL   Creatinine, Ser 0.60 0.44 - 1.00  mg/dL   Glucose, Bld 87 70 - 99 mg/dL   Calcium, Ion 1.15 1.15 - 1.40 mmol/L   TCO2 25 22 - 32 mmol/L   Hemoglobin 11.2 (L) 12.0 - 15.0 g/dL   HCT 33.0 (L) 36.0 - 46.0 %      Assessment & Plan:   1. Cellulitis of left lower extremity - cephALEXin (KEFLEX) 500 MG capsule; Take 1 capsule (500 mg total) by mouth 4 (four) times daily.  Dispense: 40 capsule; Refill: 0   Continue all other maintenance medications as listed above.  Follow up plan: No follow-ups on file.  Educational handout given for cellulitis  Terald Sleeper PA-C [facility] [address]  [place], [place] [address] [phone]   06/27/2018, 9:41 AM

## 2018-06-30 ENCOUNTER — Encounter: Payer: Self-pay | Admitting: Obstetrics & Gynecology

## 2018-06-30 ENCOUNTER — Ambulatory Visit: Payer: Medicaid Other | Admitting: Obstetrics & Gynecology

## 2018-06-30 VITALS — BP 118/74 | HR 109 | Ht 62.0 in | Wt 149.0 lb

## 2018-06-30 DIAGNOSIS — N924 Excessive bleeding in the premenopausal period: Secondary | ICD-10-CM | POA: Diagnosis not present

## 2018-06-30 MED ORDER — FLUCONAZOLE 150 MG PO TABS: 150.0000 mg | ORAL_TABLET | Freq: Once | ORAL | 0 refills | Status: DC

## 2018-06-30 NOTE — Progress Notes (Signed)
Chief Complaint  Patient presents with  . Follow-up      47 y.o. G1P0101 No LMP recorded. The current method of family planning is .  Outpatient Encounter Medications as of 06/30/2018  Medication Sig Note  . albuterol (PROVENTIL) (2.5 MG/3ML) 0.083% nebulizer solution Take 3 mLs (2.5 mg total) by nebulization every 6 (six) hours as needed for wheezing or shortness of breath.   . ALPRAZolam (XANAX) 0.5 MG tablet Take 1 tab twice a day as needed. May take one more tab before allergy shots once a week.   Marland Kitchen aspirin 81 MG chewable tablet Chew 81 mg by mouth daily.   Marland Kitchen AUVI-Q 0.3 MG/0.3ML SOAJ injection Use as directed for life-threatening allergic reaction.   . baclofen (LIORESAL) 10 MG tablet Take 10 mg by mouth 3 (three) times daily as needed.   . budesonide-formoterol (SYMBICORT) 80-4.5 MCG/ACT inhaler 1-2 inhalations by mouth 1 times daily.   . cephALEXin (KEFLEX) 500 MG capsule Take 1 capsule (500 mg total) by mouth 4 (four) times daily.   . cetirizine (ZYRTEC) 10 MG tablet Take 10 mg by mouth as needed (With shots).    . cromolyn (OPTICROM) 4 % ophthalmic solution Place 1 drop into both eyes 4 (four) times daily.    . cycloSPORINE (RESTASIS) 0.05 % ophthalmic emulsion Place 1 drop into both eyes 2 (two) times daily.   . diclofenac (VOLTAREN) 0.1 % ophthalmic solution Place 1 drop into both eyes 3 (three) times daily.    . Diethylpropion HCl CR 75 MG TB24 TK 1 T PO QD   . EPIDUO 0.1-2.5 % gel AFFECTED AREA(S) ON FACE AND CHEST EVERY NIGHT AT BEDTIME   . fluticasone (FLONASE) 50 MCG/ACT nasal spray 1-2 sprays in each nostril 3 times weekly as needed.   . furosemide (LASIX) 20 MG tablet TAKE ONE TABLET BY MOUTH AS NEEDED for fluid   . HYDROcodone-acetaminophen (NORCO) 7.5-325 MG tablet Take 1 tablet by mouth 4 (four) times daily as needed.   . IRON PO Take 1 tablet by mouth daily as needed (for low hemoglobin levels). Take when hemoglobin is low   . LINZESS 290 MCG CAPS capsule  TAKE ONE CAPSULE BY MOUTH EVERY DAY BEFORE BREAKFAST   . losartan (COZAAR) 25 MG tablet Take 1 tablet (25 mg total) by mouth 2 (two) times daily.   . medroxyPROGESTERone (PROVERA) 10 MG tablet Take 1 tablet (10 mg total) by mouth daily.   . Mepolizumab (NUCALA [place]) Inject into the skin every 28 (twenty-eight) days. 12/31/2017: 02/5/22019  . metFORMIN (GLUCOPHAGE) 500 MG tablet Take 1 tablet (500 mg total) by mouth daily with breakfast.   . montelukast (SINGULAIR) 10 MG tablet TAKE ONE TABLET BY MOUTH AT BEDTIME   . Multiple Vitamins-Minerals (SM COMPLETE ADVANCED FORMULA) TABS Take by mouth.   . Olopatadine HCl 0.6 % SOLN Place 2 sprays into both nostrils 2 (two) times daily.   . pantoprazole (PROTONIX) 40 MG tablet TAKE ONE TABLET BY MOUTH DAILY   . potassium chloride SA (K-DUR,KLOR-CON) 20 MEQ tablet TAKE TWO TABLETS BY MOUTH DAILY FOR 3 DAYS, THEN TAKE 1 TABLET BY MOUTH DAILY WHILE TAKING LASIX   . pravastatin (PRAVACHOL) 20 MG tablet Take 1 tablet (20 mg total) by mouth daily.   Marland Kitchen VANIQA 13.9 % cream APPLY TO AFFECTED AREA TWICE DAILY   . VOLTAREN 1 % GEL APPLY TWO GRAMS TOPICALLY FOUR TIMES DAILY   . fluconazole (DIFLUCAN) 150 MG tablet Take 1 tablet (  150 mg total) by mouth once for 1 dose. Take the second tablet 3 days after the first one.   . [DISCONTINUED] DULoxetine (CYMBALTA) 20 MG capsule Take 1 capsule (20 mg total) by mouth daily.    Facility-Administered Encounter Medications as of 06/30/2018  Medication  . Mepolizumab SOLR 100 mg    Subjective Jennifer Mullins was seen in March for management of perimenopausal bleeding and dysmenorrhea assoicated.  Sonogram essentially normal, very small insignificant myomas x 2,  2.2 cm is the largest Did not take her provera as prescribed because "I was doing fine"  Then she wasn't doing fine as of June.  She then started the provera as prescribed to take for 21 days however it was not adequate to control her menses So she is back in to fix  it  I informed her that this perimenopausal portion of her life will be unpredicatable and the best management is to control it not to wait until problems arise, because they are going to  She has now agreed with this plan and will take the provera 10 mg daily the first 10 days of each month and she can expect to bleed after that if indeed there is associated endometrial stimulation by her ovries, she voices understanding.  She understands it is much more difficult to "fix" the bleeding than it is to control it from the outset Past Medical History:  Diagnosis Date  . Anemia   . Anemia   . Anxiety   . Arthritis   . Asthma   . Bilateral calcaneal spurs   . Bilateral polycystic ovarian syndrome   . Bulging lumbar disc   . Carpal tunnel syndrome, bilateral   . Conjunctivitis   . COPD (chronic obstructive pulmonary disease) (HCC)    BRONCHITIS  . Diabetes mellitus without complication ([place])    TYPE 2   DX  4-5 YRS AGO  . ETD (eustachian tube dysfunction)   . ETD (Eustachian tube dysfunction), bilateral    left worse than right  . Fibroids    uterine  . GAD (generalized anxiety disorder)   . GERD (gastroesophageal reflux disease)    TAKES PRESCRIPTION MEDS  . Glaucoma    BOTH EYES  . Hematoma    Hematoma of Left Hand- had to wear a split  . Hyperlipidemia   . Hypertension   . Hypothyroidism    NODULES ON THYROID  . PONV (postoperative nausea and vomiting)   . Pulmonary embolism ([place])   . Tachycardia   . TMJ (dislocation of temporomandibular joint)   . Trigger finger   . Trigger finger of left thumb     Past Surgical History:  Procedure Laterality Date  . ANTERIOR CERVICAL DECOMP/DISCECTOMY FUSION N/A 11/13/2016   Procedure: Cervical five-six, Cervical six-seven Anterior Cervical Discectomy and Fusion, Allograft, Plate;  Surgeon: Marybelle Killings, MD;  Location: [place];  Service: Orthopedics;  Laterality: N/A;  . BREAST SURGERY    . CARPAL TUNNEL RELEASE    . DILATION AND  CURETTAGE OF UTERUS    . EXPLORATORY LAPAROTOMY    . LIPOMA EXCISION Left 06/15/2018   Procedure: LEFT CALF LIPOMA EXCISION;  Surgeon: Marybelle Killings, MD;  Location: [place];  Service: Orthopedics;  Laterality: Left;  . NASAL SINUS SURGERY  07/22/13   Dr. Redmond Pulling in [place]  . TRIGGER FINGER RELEASE Right 06/15/2018   Procedure: RIGHT TRIGGER THUMB RELEASE;  Surgeon: Marybelle Killings, MD;  Location: [place];  Service: Orthopedics;  Laterality: Right;    OB History    Gravida  1   Para  1   Term      Preterm  1   AB      Living  1     SAB      TAB      Ectopic      Multiple      Live Births              Allergies  Allergen Reactions  . Demerol [Meperidine] Shortness Of Breath    FLUSHING AND SHORTNESS OF BREATH  . Morphine And Related Shortness Of Breath    Flushed and hot hyper Flushed and hot hyper Flushed and hot hyper Flushed and hot hyper  . Diltiazem Hcl Hives  . Lisinopril     Caused cough  . Lyrica [Pregabalin]     Chest pain, elevated heart rate  . Tramadol Other (See Comments)    Tears up stomach    Social History   Socioeconomic History  . Marital status: Married    Spouse name: Not on file  . Number of children: Not on file  . Years of education: Not on file  . Highest education level: Not on file  Occupational History  . Occupation: Unemployed  Social Needs  . Financial resource strain: Not on file  . Food insecurity:    Worry: Not on file    Inability: Not on file  . Transportation needs:    Medical: Not on file    Non-medical: Not on file  Tobacco Use  . Smoking status: Former Smoker    Packs/day: 0.25    Years: 5.00    Pack years: 1.25    Types: Cigarettes    Last attempt to quit: 11/18/1989    Years since quitting: 28.6  . Smokeless tobacco: Never Used  Substance and Sexual Activity  . Alcohol use: No  . Drug use: No  . Sexual activity: Yes    Birth control/protection: None  Lifestyle  .  Physical activity:    Days per week: Not on file    Minutes per session: Not on file  . Stress: Not on file  Relationships  . Social connections:    Talks on phone: Not on file    Gets together: Not on file    Attends religious service: Not on file    Active member of club or organization: Not on file    Attends meetings of clubs or organizations: Not on file    Relationship status: Not on file  Other Topics Concern  . Not on file  Social History Narrative  . Not on file    Family History  Problem Relation Age of Onset  . Hypertension Mother   . Hypertension Father     Medications:       Current Outpatient Medications:  .  albuterol (PROVENTIL) (2.5 MG/3ML) 0.083% nebulizer solution, Take 3 mLs (2.5 mg total) by nebulization every 6 (six) hours as needed for wheezing or shortness of breath., Disp: 1080 mL, Rfl: 0 .  ALPRAZolam (XANAX) 0.5 MG tablet, Take 1 tab twice a day as needed. May take one more tab before allergy shots once a week., Disp: 65 tablet, Rfl: 3 .  aspirin 81 MG chewable tablet, Chew 81 mg by mouth daily., Disp: , Rfl:  .  AUVI-Q 0.3 MG/0.3ML SOAJ injection, Use as directed for life-threatening allergic reaction., Disp: 2 Device, Rfl: 3 .  baclofen (  LIORESAL) 10 MG tablet, Take 10 mg by mouth 3 (three) times daily as needed., Disp: , Rfl: 1 .  budesonide-formoterol (SYMBICORT) 80-4.5 MCG/ACT inhaler, 1-2 inhalations by mouth 1 times daily., Disp: 1 Inhaler, Rfl: 5 .  cephALEXin (KEFLEX) 500 MG capsule, Take 1 capsule (500 mg total) by mouth 4 (four) times daily., Disp: 40 capsule, Rfl: 0 .  cetirizine (ZYRTEC) 10 MG tablet, Take 10 mg by mouth as needed (With shots). , Disp: , Rfl:  .  cromolyn (OPTICROM) 4 % ophthalmic solution, Place 1 drop into both eyes 4 (four) times daily. , Disp: , Rfl: 1 .  cycloSPORINE (RESTASIS) 0.05 % ophthalmic emulsion, Place 1 drop into both eyes 2 (two) times daily., Disp: , Rfl:  .  diclofenac (VOLTAREN) 0.1 % ophthalmic solution,  Place 1 drop into both eyes 3 (three) times daily. , Disp: , Rfl: 0 .  Diethylpropion HCl CR 75 MG TB24, TK 1 T PO QD, Disp: , Rfl: 0 .  EPIDUO 0.1-2.5 % gel, AFFECTED AREA(S) ON FACE AND CHEST EVERY NIGHT AT BEDTIME, Disp: , Rfl: 0 .  fluticasone (FLONASE) 50 MCG/ACT nasal spray, 1-2 sprays in each nostril 3 times weekly as needed., Disp: 16 g, Rfl: 5 .  furosemide (LASIX) 20 MG tablet, TAKE ONE TABLET BY MOUTH AS NEEDED for fluid, Disp: 90 tablet, Rfl: 1 .  HYDROcodone-acetaminophen (NORCO) 7.5-325 MG tablet, Take 1 tablet by mouth 4 (four) times daily as needed., Disp: , Rfl: 0 .  IRON PO, Take 1 tablet by mouth daily as needed (for low hemoglobin levels). Take when hemoglobin is low, Disp: , Rfl:  .  LINZESS 290 MCG CAPS capsule, TAKE ONE CAPSULE BY MOUTH EVERY DAY BEFORE BREAKFAST, Disp: 90 capsule, Rfl: 1 .  losartan (COZAAR) 25 MG tablet, Take 1 tablet (25 mg total) by mouth 2 (two) times daily., Disp: 180 tablet, Rfl: 2 .  medroxyPROGESTERone (PROVERA) 10 MG tablet, Take 1 tablet (10 mg total) by mouth daily., Disp: 30 tablet, Rfl: 11 .  Mepolizumab (NUCALA [place]), Inject into the skin every 28 (twenty-eight) days., Disp: , Rfl:  .  metFORMIN (GLUCOPHAGE) 500 MG tablet, Take 1 tablet (500 mg total) by mouth daily with breakfast., Disp: 90 tablet, Rfl: 1 .  montelukast (SINGULAIR) 10 MG tablet, TAKE ONE TABLET BY MOUTH AT BEDTIME, Disp: 30 tablet, Rfl: 5 .  Multiple Vitamins-Minerals (SM COMPLETE ADVANCED FORMULA) TABS, Take by mouth., Disp: , Rfl:  .  Olopatadine HCl 0.6 % SOLN, Place 2 sprays into both nostrils 2 (two) times daily., Disp: 1 Bottle, Rfl: 3 .  pantoprazole (PROTONIX) 40 MG tablet, TAKE ONE TABLET BY MOUTH DAILY, Disp: 90 tablet, Rfl: 1 .  potassium chloride SA (K-DUR,KLOR-CON) 20 MEQ tablet, TAKE TWO TABLETS BY MOUTH DAILY FOR 3 DAYS, THEN TAKE 1 TABLET BY MOUTH DAILY WHILE TAKING LASIX, Disp: 90 tablet, Rfl: 1 .  pravastatin (PRAVACHOL) 20 MG tablet, Take 1 tablet (20 mg total)  by mouth daily., Disp: 90 tablet, Rfl: 1 .  VANIQA 13.9 % cream, APPLY TO AFFECTED AREA TWICE DAILY, Disp: , Rfl: 5 .  VOLTAREN 1 % GEL, APPLY TWO GRAMS TOPICALLY FOUR TIMES DAILY, Disp: 100 g, Rfl: 2 .  fluconazole (DIFLUCAN) 150 MG tablet, Take 1 tablet (150 mg total) by mouth once for 1 dose. Take the second tablet 3 days after the first one., Disp: 3 tablet, Rfl: 0  Current Facility-Administered Medications:  Marland Kitchen  Mepolizumab SOLR 100 mg, 100 mg, Subcutaneous, Q28 days,  Kozlow, Donnamarie Poag, MD, 100 mg at 06/22/18 0908  Objective Blood pressure 118/74, pulse (!) 109, height 5\' 2"  (1.575 m), weight 149 lb (67.6 kg).  Gen WDWN NAD  Pertinent ROS No burning with urination, frequency or urgency No nausea, vomiting or diarrhea Nor fever chills or other constitutional symptoms   Labs or studies     Impression Diagnoses this Encounter::   ICD-10-CM   1. Abnormal perimenopausal bleeding N92.4     Established relevant diagnosis(es):   Plan/Recommendations: Meds ordered this encounter  Medications  . fluconazole (DIFLUCAN) 150 MG tablet    Sig: Take 1 tablet (150 mg total) by mouth once for 1 dose. Take the second tablet 3 days after the first one.    Dispense:  3 tablet    Refill:  0    Labs or Scans Ordered: No orders of the defined types were placed in this encounter.   Management:: Pt will take provera 10 mg daily the first 10 days of each month and see if that is enough to "control" her perimenopausal DUB  If there are issues she will contact me via MyChart  Follow up Return if symptoms worsen or fail to improve.     Face to face time:  15 minutes  Greater than 50% of the visit time was spent in counseling and coordination of care with the patient.  The summary and outline of the counseling and care coordination is summarized in the note above.   All questions were answered.    All questions were answered.

## 2018-07-01 ENCOUNTER — Encounter: Payer: Self-pay | Admitting: *Deleted

## 2018-07-01 DIAGNOSIS — J301 Allergic rhinitis due to pollen: Secondary | ICD-10-CM | POA: Diagnosis not present

## 2018-07-01 NOTE — Progress Notes (Signed)
Vials made. Exp: 07-02-19. hv 

## 2018-07-02 ENCOUNTER — Ambulatory Visit (INDEPENDENT_AMBULATORY_CARE_PROVIDER_SITE_OTHER): Payer: Medicaid Other | Admitting: *Deleted

## 2018-07-02 DIAGNOSIS — J309 Allergic rhinitis, unspecified: Secondary | ICD-10-CM | POA: Diagnosis not present

## 2018-07-02 MED ORDER — CETIRIZINE HCL 10 MG PO TABS: 10.0000 mg | ORAL_TABLET | Freq: Two times a day (BID) | ORAL | 5 refills | Status: DC

## 2018-07-03 ENCOUNTER — Ambulatory Visit (INDEPENDENT_AMBULATORY_CARE_PROVIDER_SITE_OTHER): Payer: Medicaid Other | Admitting: Orthopaedic Surgery

## 2018-07-03 ENCOUNTER — Encounter (INDEPENDENT_AMBULATORY_CARE_PROVIDER_SITE_OTHER): Payer: Self-pay | Admitting: Orthopaedic Surgery

## 2018-07-03 VITALS — BP 153/87 | HR 100 | Ht 62.0 in | Wt 150.0 lb

## 2018-07-03 DIAGNOSIS — M65311 Trigger thumb, right thumb: Secondary | ICD-10-CM

## 2018-07-03 DIAGNOSIS — D1724 Benign lipomatous neoplasm of skin and subcutaneous tissue of left leg: Secondary | ICD-10-CM

## 2018-07-03 NOTE — Progress Notes (Signed)
Post-Op Visit Note   Patient: Jennifer Mullins           Date of Birth: 1971-02-13           MRN: [id number] Visit Date: 07/03/2018 PCP: Eustaquio Maize, MD   Assessment & Plan: Postop leg lipoma removal of her left leg she is developed some fluid at the level of the incision that stands to 3 cm.  Lipoma was benign but it did leave a cavity after removal.  She was placed on antibiotics and had a yeast infection got treated for that.  Trigger thumb is no longer catching and sutures are still present.  Sutures are removed from her trigger thumb release she is encouraged to get to use of her thumb with flexion.  I aspirated 30 cc of clear thin fluid after Betadine prep using 18-gauge needle and then applied compressive dressing to help hold the skin down to subtenons tissue.  She will keep the dressing on for 2 days then can take it off put some cotton balls after shower on the area of the old scar and then rewrap with an Ace wrap to hold the skin down.  I will recheck her in 2 weeks.  Chief Complaint:  Chief Complaint  Patient presents with  . Left Leg - Routine Post Op  . Right Hand - Routine Post Op   Visit Diagnoses:  1. Trigger thumb, right thumb   2. Lipoma of left lower extremity     Plan: Plan as above.  Recheck 2 weeks.  Follow-Up Instructions: No follow-ups on file.   Orders:  No orders of the defined types were placed in this encounter.  No orders of the defined types were placed in this encounter.   Imaging: No results found.  PMFS History: Patient Active Problem List   Diagnosis Date Noted  . Trigger thumb, right thumb 06/15/2018  . Lipoma of lower extremity 06/15/2018  . 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 Pelvic pain in female 12/11/2017  . Dyspareunia, female 12/11/2017  . Menometrorrhagia 12/11/2017  . Pain in right ankle and joints of right foot 10/29/2017  . Chronic pain of right knee 10/29/2017  . Right elbow pain 10/29/2017  . Iron deficiency  anemia due to chronic blood loss 07/07/2017  . S/P cervical spinal fusion 02/27/2017  . Dysfunctional uterine bleeding 11/24/2016  . Anticoagulated 11/24/2016  . Microcytic anemia 11/24/2016  . Tachycardia   . Vaginal bleeding   . Allergic rhinoconjunctivitis 07/28/2015  . Chronic headache 07/28/2015  . PCOS (polycystic ovarian syndrome) 06/20/2015  . 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 Hyperlipidemia LDL goal <100 06/20/2015  . GAD (generalized anxiety disorder) 05/18/2014  . GERD (gastroesophageal reflux disease) 05/18/2014  . Body fluid retention 07/07/2013  . Chronic sinusitis 07/02/2013  . Extrinsic asthma 05/20/2013  . HTN (hypertension) 05/20/2013  . Prediabetes 05/20/2013   Past Medical History:  Diagnosis Date  . Anemia   . Anemia   . Anxiety   . Arthritis   . Asthma   . Bilateral calcaneal spurs   . Bilateral polycystic ovarian syndrome   . Bulging lumbar disc   . Carpal tunnel syndrome, bilateral   . Conjunctivitis   . COPD (chronic obstructive pulmonary disease) (HCC)    BRONCHITIS  . Diabetes mellitus without complication ([place])    TYPE 2   DX  4-5 YRS AGO  . ETD (eustachian tube dysfunction)   . ETD (Eustachian tube dysfunction), bilateral    left worse  than right  . Fibroids    uterine  . GAD (generalized anxiety disorder)   . GERD (gastroesophageal reflux disease)    TAKES PRESCRIPTION MEDS  . Glaucoma    BOTH EYES  . Hematoma    Hematoma of Left Hand- had to wear a split  . Hyperlipidemia   . Hypertension   . Hypothyroidism    NODULES ON THYROID  . PONV (postoperative nausea and vomiting)   . Pulmonary embolism ([place])   . Tachycardia   . TMJ (dislocation of temporomandibular joint)   . Trigger finger   . Trigger finger of left thumb     Family History  Problem Relation Age of Onset  . Hypertension Mother   . Hypertension Father     Past Surgical History:  Procedure Laterality Date  . ANTERIOR CERVICAL DECOMP/DISCECTOMY FUSION N/A  11/13/2016   Procedure: Cervical five-six, Cervical six-seven Anterior Cervical Discectomy and Fusion, Allograft, Plate;  Surgeon: Marybelle Killings, MD;  Location: [place];  Service: Orthopedics;  Laterality: N/A;  . BREAST SURGERY    . CARPAL TUNNEL RELEASE    . DILATION AND CURETTAGE OF UTERUS    . EXPLORATORY LAPAROTOMY    . LIPOMA EXCISION Left 06/15/2018   Procedure: LEFT CALF LIPOMA EXCISION;  Surgeon: Marybelle Killings, MD;  Location: [place];  Service: Orthopedics;  Laterality: Left;  . NASAL SINUS SURGERY  07/22/13   Dr. Redmond Pulling in [place]  . TRIGGER FINGER RELEASE Right 06/15/2018   Procedure: RIGHT TRIGGER THUMB RELEASE;  Surgeon: Marybelle Killings, MD;  Location: [place];  Service: Orthopedics;  Laterality: Right;   Social History   Occupational History  . Occupation: Unemployed  Tobacco Use  . Smoking status: Former Smoker    Packs/day: 0.25    Years: 5.00    Pack years: 1.25    Types: Cigarettes    Last attempt to quit: 11/18/1989    Years since quitting: 28.6  . Smokeless tobacco: Never Used  Substance and Sexual Activity  . Alcohol use: No  . Drug use: No  . Sexual activity: Yes    Birth control/protection: None

## 2018-07-06 DIAGNOSIS — J3089 Other allergic rhinitis: Secondary | ICD-10-CM | POA: Diagnosis not present

## 2018-07-07 ENCOUNTER — Encounter

## 2018-07-07 ENCOUNTER — Ambulatory Visit: Payer: Medicaid Other | Admitting: Podiatry

## 2018-07-09 ENCOUNTER — Inpatient Hospital Stay (INDEPENDENT_AMBULATORY_CARE_PROVIDER_SITE_OTHER): Payer: Medicaid Other | Admitting: Orthopaedic Surgery

## 2018-07-09 ENCOUNTER — Encounter: Payer: Self-pay | Admitting: Pediatrics

## 2018-07-09 ENCOUNTER — Ambulatory Visit: Payer: Medicaid Other | Admitting: Pediatrics

## 2018-07-09 VITALS — BP 134/80 | HR 83 | Temp 97.5°F | Ht 62.0 in | Wt 150.6 lb

## 2018-07-09 DIAGNOSIS — N938 Other specified abnormal uterine and vaginal bleeding: Secondary | ICD-10-CM

## 2018-07-09 DIAGNOSIS — M797 Fibromyalgia: Secondary | ICD-10-CM

## 2018-07-09 DIAGNOSIS — F411 Generalized anxiety disorder: Secondary | ICD-10-CM

## 2018-07-09 DIAGNOSIS — M7989 Other specified soft tissue disorders: Secondary | ICD-10-CM

## 2018-07-09 DIAGNOSIS — M65311 Trigger thumb, right thumb: Secondary | ICD-10-CM

## 2018-07-09 DIAGNOSIS — D1724 Benign lipomatous neoplasm of skin and subcutaneous tissue of left leg: Secondary | ICD-10-CM

## 2018-07-09 DIAGNOSIS — I10 Essential (primary) hypertension: Secondary | ICD-10-CM

## 2018-07-09 DIAGNOSIS — Z981 Arthrodesis status: Secondary | ICD-10-CM

## 2018-07-09 DIAGNOSIS — N921 Excessive and frequent menstruation with irregular cycle: Secondary | ICD-10-CM

## 2018-07-09 MED ORDER — ALPRAZOLAM 0.5 MG PO TABS: ORAL_TABLET | ORAL | 3 refills | Status: DC

## 2018-07-09 NOTE — Progress Notes (Addendum)
Subjective:   Patient ID: Jennifer Mullins, female    DOB: 1971-06-02, 47 y.o.   MRN: [id number] CC: Home health nurse  HPI: Jennifer Mullins is a 47 y.o. female   Here today with her husband.  She has recently had trigger finger surgery on her right thumb, and is in a splint much of the day still due to pain.  She also had a lipoma removed on her left anterior shin.  She has some swelling that comes and goes in her left leg.  She is having a lot of trouble with her usual activities of daily living, bathing, dressing.  Her husband has health problems and is not able to help her.  She asks about a home health nurse coming out to evaluate her for extra services.  Walking with a cane primarily now.   Continues to have fibromyalgia flares, leading to achiness throughout the body, decreased ability to perform activities of daily living for a day or more at a time.  She also continues to have some activities limited due to her neck pain following cervical spinal fusion last year.  Anxiety: Stable.  She stopped taking Cymbalta, was making her mood worse.  She has been taking Xanax as prescribed.  Not running out early.  Menorrhagia: Following with gynecology.  Now back on progesterone.  She is hopeful that this will help control it in the future so she does not need any more procedures.  Hypertension: Has noticed that her blood pressure at home has been slightly higher.  She is taking her blood pressure medicine regularly.  No fevers, appetite has been okay.  Relevant past medical, surgical, family and social history reviewed. Allergies and medications reviewed and updated. Social History   Tobacco Use  Smoking Status Former Smoker  . Packs/day: 0.25  . Years: 5.00  . Pack years: 1.25  . Types: Cigarettes  . Last attempt to quit: 11/18/1989  . Years since quitting: 28.6  Smokeless Tobacco Never Used   ROS: Per HPI   Objective:    BP 134/80   Pulse 83   Temp (!) 97.5 F (36.4 C) (Oral)    Ht 5\' 2"  (1.575 m)   Wt 150 lb 9.6 oz (68.3 kg)   BMI 27.55 kg/m   Wt Readings from Last 3 Encounters:  07/09/18 150 lb 9.6 oz (68.3 kg)  07/03/18 150 lb (68 kg)  06/30/18 149 lb (67.6 kg)    Gen: NAD, alert, cooperative with exam, NCAT EYES: EOMI, no conjunctival injection, or no icterus ENT:  TMs pearly gray b/l, OP without erythema LYMPH: no cervical LAD CV: NRRR, normal S1/S2, no murmur Resp: CTABL, no wheezes, normal WOB Abd: +BS, soft, NTND.  Ext: Left foot slightly swollen compared to right foot.  No calf tenderness.  No redness.   Neuro: Alert and oriented MSK: normal muscle bulk Skin: Well-healing incision base of right thumb, no drainage or redness.  Surgical incision well healing left anterior shin.    Assessment & Plan:  Jennifer Mullins was seen today for home health nurse.  Diagnoses and all orders for this visit:  Leg swelling Continue to monitor.  Keeping leg wrapped in Ace bandage per orthopedic recommendations.  Any worsening in swelling, redness, pain needs to be seen.  Will refer for home health assessment given her limitations in activities of daily living.  GAD (generalized anxiety disorder) Stable, continue below -     ALPRAZolam (XANAX) 0.5 MG tablet; Take 1 tab twice a day  as needed. May take one more tab before allergy shots once a week.  Lipoma of left lower extremity Now excised.  Healing well.  Following with orthopedics.  Trigger thumb, right thumb Healing well, following with orthopedics.  She is limited with her activities due to pain.  Menometrorrhagia Dysfunctional uterine bleeding Following with gynecology, now on progesterone.  Essential hypertension Stable, continue to check at home.  Take blood pressure medicines as prescribed.  Let me know if persistently elevated.  S/P cervical spinal fusion Follows with pain management for chronic narcotic treatment for her pain.  Fibromyalgia Has not tolerated Cymbalta.  I spent >30 minutes with the  patient with over 50% of the encounter time dedicated to counseling on the above problems.   Follow up plan: Return in about 3 months (around 10/09/2018). [name], MD [place]

## 2018-07-09 NOTE — Patient Instructions (Addendum)
Fax # [phone]

## 2018-07-09 NOTE — Addendum Note (Signed)
Addended by: Wardell Heath on: 07/09/2018 04:03 PM   Modules accepted: Orders

## 2018-07-09 NOTE — Addendum Note (Signed)
Addended by: Eustaquio Maize on: 07/09/2018 01:38 PM   Modules accepted: Level of Service

## 2018-07-09 NOTE — Telephone Encounter (Signed)
Patient seen today

## 2018-07-10 ENCOUNTER — Ambulatory Visit (INDEPENDENT_AMBULATORY_CARE_PROVIDER_SITE_OTHER): Payer: Medicaid Other

## 2018-07-10 DIAGNOSIS — J309 Allergic rhinitis, unspecified: Secondary | ICD-10-CM

## 2018-07-13 ENCOUNTER — Telehealth: Payer: Self-pay | Admitting: Pediatrics

## 2018-07-13 NOTE — Telephone Encounter (Signed)
Patient calling to check on paperwork needed filled out by Dr. Evette Doffing for [facility] and checking on home health.  I explained to her that Dr. Evette Doffing has paperwork to be signed (once Dr. Clayton Bibles signs we will fax) and that [facility] has been given the referral for her home health.  They will be contacting her.

## 2018-07-14 NOTE — Telephone Encounter (Signed)
Signed, paperwork back to [place].

## 2018-07-15 ENCOUNTER — Ambulatory Visit (INDEPENDENT_AMBULATORY_CARE_PROVIDER_SITE_OTHER): Payer: Medicaid Other

## 2018-07-15 DIAGNOSIS — J309 Allergic rhinitis, unspecified: Secondary | ICD-10-CM | POA: Diagnosis not present

## 2018-07-16 ENCOUNTER — Ambulatory Visit (HOSPITAL_COMMUNITY)
Admission: RE | Admit: 2018-07-16 | Discharge: 2018-07-16 | Disposition: A | Discharge status: Home / Self Care | Payer: Medicaid Other | Source: Ambulatory Visit | Attending: Family | Admitting: Family

## 2018-07-16 ENCOUNTER — Ambulatory Visit (INDEPENDENT_AMBULATORY_CARE_PROVIDER_SITE_OTHER): Payer: Medicaid Other | Admitting: Orthopaedic Surgery

## 2018-07-16 ENCOUNTER — Encounter (INDEPENDENT_AMBULATORY_CARE_PROVIDER_SITE_OTHER): Payer: Self-pay | Admitting: Orthopaedic Surgery

## 2018-07-16 VITALS — BP 130/85 | HR 104 | Ht 62.0 in | Wt 150.0 lb

## 2018-07-16 DIAGNOSIS — M7989 Other specified soft tissue disorders: Secondary | ICD-10-CM | POA: Diagnosis present

## 2018-07-16 MED ORDER — APIXABAN 2.5 MG PO TABS: 2.5000 mg | ORAL_TABLET | Freq: Every day | ORAL | 0 refills | Status: DC

## 2018-07-16 NOTE — Progress Notes (Signed)
Office Visit Note   Patient: Jennifer Mullins           Date of Birth: [birth year]           MRN: [id number] Visit Date: 07/16/2018              Requested by: Eustaquio Maize, MD [place], [place] [address] PCP: Eustaquio Maize, MD   Assessment & Plan: Visit Diagnoses:  1. Calf swelling     Plan: Prescription given for Eliquis 2.5 mg.  We will set up for an urgent Doppler test to rule out DVT left lower extremity with her history of PE.  I plan to recheck her in 2 weeks.  They will call with the results of the Doppler test.  If Doppler test is positive then she will need to restart her Eliquis.  Follow-Up Instructions: No follow-ups on file.   Orders:  No orders of the defined types were placed in this encounter.  No orders of the defined types were placed in this encounter.     Procedures: No procedures performed   Clinical Data: No additional findings.   Subjective: Chief Complaint  Patient presents with  . Left Leg - Follow-up  . Right Thumb - Follow-up    HPI 47 year old female returns post lipoma excision left leg with reaccumulation of serous fluid which was aspirated last week 7 days ago.  She been wearing an Ace wrap and now has swelling that she states that extended up to her mid thigh level.  She has some pitting edema of the foot and states she has been elevating her leg taking the Ace wrap off and on when she takes it off she sometimes gets reaccumulation of fluid after a short period of time and then puts a compressive wrap back on.  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  She had problems with bleeding of the uterus when she was on full dose Eliquis.  History of DVT prior to her anterior cervical procedure.  She also noticed some swelling in her left arm and previous ultrasound showed that she had a superficial clot in the basilic vein likely related to IV or blood draws.   This was 2 days after admission with the pulmonary embolism.  When she was on 10 mg of Eliquis one point she had some hemoptysis and then was reduced to 5 mg.  Later she was dropped to 2.5 mg.  Review of Systems 14 point up date review of systems negative other than as mentioned above.   Objective: Vital Signs: BP 130/85   Pulse (!) 104   Ht 5\' 2"  (1.575 m)   Wt 150 lb (68 kg)   BMI 27.44 kg/m   Physical Exam  Constitutional: She is oriented to person, place, and time. She appears well-developed.  HENT:  Head: Normocephalic.  Right Ear: External ear normal.  Left Ear: External ear normal.  Eyes: Pupils are equal, round, and reactive to light.  Neck: No tracheal deviation present. No thyromegaly present.  Cardiovascular: Normal rate.  Pulmonary/Chest: Effort normal.  Abdominal: Soft.  Neurological: She is alert and oriented to person, place, and time.  Skin: Skin is warm and dry.  Psychiatric: She has a normal mood and affect. Her behavior is normal.    Ortho Exam no evidence of cellulitis at the area of lipoma excision.  There is some firmness  subcutaneous scar tissue present.  Minimal reaccumulation of fluid but after removing the Ace wrap begins to swell after 1520 minutes.  She does have lower extremity edema with poor visualization of the foot veins positive Homans sign.  Specialty Comments:  No specialty comments available.  Imaging: No results found.   PMFS History: Patient Active Problem List   Diagnosis Date Noted  . Fibromyalgia 07/09/2018  . Trigger thumb, right thumb 06/15/2018  . Lipoma of lower extremity 06/15/2018  . 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 Pelvic pain in female 12/11/2017  . Dyspareunia, female 12/11/2017  . Menometrorrhagia 12/11/2017  . Pain in right ankle and joints of right foot 10/29/2017  . Chronic pain of right knee 10/29/2017  . Right elbow pain 10/29/2017  . Iron deficiency anemia due to chronic blood loss 07/07/2017  .  S/P cervical spinal fusion 02/27/2017  . Dysfunctional uterine bleeding 11/24/2016  . Anticoagulated 11/24/2016  . Microcytic anemia 11/24/2016  . Tachycardia   . Vaginal bleeding   . Allergic rhinoconjunctivitis 07/28/2015  . Chronic headache 07/28/2015  . PCOS (polycystic ovarian syndrome) 06/20/2015  . 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 Hyperlipidemia LDL goal <100 06/20/2015  . GAD (generalized anxiety disorder) 05/18/2014  . GERD (gastroesophageal reflux disease) 05/18/2014  . Body fluid retention 07/07/2013  . Chronic sinusitis 07/02/2013  . Extrinsic asthma 05/20/2013  . HTN (hypertension) 05/20/2013  . Prediabetes 05/20/2013   Past Medical History:  Diagnosis Date  . Anemia   . Anemia   . Anxiety   . Arthritis   . Asthma   . Bilateral calcaneal spurs   . Bilateral polycystic ovarian syndrome   . Bulging lumbar disc   . Carpal tunnel syndrome, bilateral   . Conjunctivitis   . COPD (chronic obstructive pulmonary disease) (HCC)    BRONCHITIS  . Diabetes mellitus without complication ([place])    TYPE 2   DX  4-5 YRS AGO  . ETD (eustachian tube dysfunction)   . ETD (Eustachian tube dysfunction), bilateral    left worse than right  . Fibroids    uterine  . GAD (generalized anxiety disorder)   . GERD (gastroesophageal reflux disease)    TAKES PRESCRIPTION MEDS  . Glaucoma    BOTH EYES  . Hematoma    Hematoma of Left Hand- had to wear a split  . Hyperlipidemia   . Hypertension   . Hypothyroidism    NODULES ON THYROID  . PONV (postoperative nausea and vomiting)   . Pulmonary embolism ([facility])   . Tachycardia   . TMJ (dislocation of temporomandibular joint)   . Trigger finger   . Trigger finger of left thumb     Family History  Problem Relation Age of Onset  . Hypertension Mother   . Hypertension Father     Past Surgical History:  Procedure Laterality Date  . ANTERIOR CERVICAL DECOMP/DISCECTOMY FUSION N/A 11/13/2016   Procedure: Cervical five-six,  Cervical six-seven Anterior Cervical Discectomy and Fusion, Allograft, Plate;  Surgeon: Marybelle Killings, MD;  Location: [facility];  Service: Orthopedics;  Laterality: N/A;  . BREAST SURGERY    . CARPAL TUNNEL RELEASE    . DILATION AND CURETTAGE OF UTERUS    . EXPLORATORY LAPAROTOMY    . LIPOMA EXCISION Left 06/15/2018   Procedure: LEFT CALF LIPOMA EXCISION;  Surgeon: Marybelle Killings, MD;  Location: [facility];  Service: Orthopedics;  Laterality: Left;  . NASAL SINUS SURGERY  07/22/13   Dr. Redmond Pulling in [place]  .  TRIGGER FINGER RELEASE Right 06/15/2018   Procedure: RIGHT TRIGGER THUMB RELEASE;  Surgeon: Marybelle Killings, MD;  Location: [facility];  Service: Orthopedics;  Laterality: Right;   Social History   Occupational History  . Occupation: Unemployed  Tobacco Use  . Smoking status: Former Smoker    Packs/day: 0.25    Years: 5.00    Pack years: 1.25    Types: Cigarettes    Last attempt to quit: 11/18/1989    Years since quitting: 28.6  . Smokeless tobacco: Never Used  Substance and Sexual Activity  . Alcohol use: No  . Drug use: No  . Sexual activity: Yes    Birth control/protection: None

## 2018-07-16 NOTE — Addendum Note (Signed)
Addended by: Meyer Cory on: 07/16/2018 09:47 AM   Modules accepted: Orders

## 2018-07-17 ENCOUNTER — Telehealth (INDEPENDENT_AMBULATORY_CARE_PROVIDER_SITE_OTHER): Payer: Self-pay | Admitting: Radiology

## 2018-07-17 NOTE — Telephone Encounter (Signed)
Dr. Lorin Mercy received phone call with results of patient's LLE Doppler to R/O DVT. There was no DVT seen. Dr. Lorin Mercy instructed them to let patient know that it was negative, to go home and elevate the leg, and to not take the Eliquis that was prescribed should the test be positive.  Patient called back to office after the study. Dr. Lorin Mercy explained all of this to her in detail, as well as how to dress the leg with no ace wrap but only gauze and tape over the incision left lower leg.  He explained compression was too tight from ace wrap which had aided in the swelling in patient's lower extremity.  Patient expressed understanding.

## 2018-07-21 ENCOUNTER — Ambulatory Visit: Payer: Medicaid Other | Admitting: Neurology

## 2018-07-21 ENCOUNTER — Ambulatory Visit: Payer: Self-pay

## 2018-07-21 ENCOUNTER — Ambulatory Visit (INDEPENDENT_AMBULATORY_CARE_PROVIDER_SITE_OTHER): Payer: Medicaid Other | Admitting: Orthopaedic Surgery

## 2018-07-22 ENCOUNTER — Ambulatory Visit (INDEPENDENT_AMBULATORY_CARE_PROVIDER_SITE_OTHER): Payer: Medicaid Other

## 2018-07-22 DIAGNOSIS — J4550 Severe persistent asthma, uncomplicated: Secondary | ICD-10-CM

## 2018-07-23 ENCOUNTER — Other Ambulatory Visit: Payer: Self-pay | Admitting: Obstetrics & Gynecology

## 2018-07-24 ENCOUNTER — Ambulatory Visit (INDEPENDENT_AMBULATORY_CARE_PROVIDER_SITE_OTHER): Payer: Medicaid Other

## 2018-07-24 DIAGNOSIS — J309 Allergic rhinitis, unspecified: Secondary | ICD-10-CM | POA: Diagnosis not present

## 2018-07-29 ENCOUNTER — Ambulatory Visit (INDEPENDENT_AMBULATORY_CARE_PROVIDER_SITE_OTHER): Payer: Medicaid Other | Admitting: *Deleted

## 2018-07-29 DIAGNOSIS — J309 Allergic rhinitis, unspecified: Secondary | ICD-10-CM | POA: Diagnosis not present

## 2018-07-30 ENCOUNTER — Encounter (INDEPENDENT_AMBULATORY_CARE_PROVIDER_SITE_OTHER): Payer: Self-pay | Admitting: Orthopaedic Surgery

## 2018-07-30 ENCOUNTER — Ambulatory Visit (INDEPENDENT_AMBULATORY_CARE_PROVIDER_SITE_OTHER): Payer: Medicaid Other | Admitting: Orthopaedic Surgery

## 2018-07-30 VITALS — BP 120/75 | HR 83 | Ht 62.0 in | Wt 150.0 lb

## 2018-07-30 DIAGNOSIS — M65311 Trigger thumb, right thumb: Secondary | ICD-10-CM

## 2018-07-30 DIAGNOSIS — D1724 Benign lipomatous neoplasm of skin and subcutaneous tissue of left leg: Secondary | ICD-10-CM

## 2018-07-30 NOTE — Progress Notes (Signed)
Post-Op Visit Note   Patient: Jennifer Mullins           Date of Birth: 1971-04-22           MRN: [id number] Visit Date: 07/30/2018 PCP: Eustaquio Maize, MD   Assessment & Plan:  Chief Complaint:  Chief Complaint  Patient presents with  . Right Thumb - Follow-up    06/15/18 Right Trigger Thumb Release  . Left Leg - Follow-up    06/15/18 Left Leg Lipoma Excision   Visit Diagnoses:  1. Trigger thumb, right thumb   2. Lipoma of left lower extremity     Plan: 47 year old female returns post right trigger thumb release she has some tenderness at the incision is been putting salves and Mederma on it.  She had some excision right medial proximal tibia had some recurrence of serous fluid which was aspirated clear without purulence.  She wore compressive wrap for a while has swelling in her left lower extremity and Doppler test was negative for DVT with her past history of DVT and PE.  She still has some mild swelling at the incision site of the lipoma but is less prominent than previously.  When she puts a wrap on it and goes down.  Follow-Up Instructions: No follow-ups on file.   Orders:  No orders of the defined types were placed in this encounter.  No orders of the defined types were placed in this encounter.   Imaging: No results found.  PMFS History: Patient Active Problem List   Diagnosis Date Noted  . Fibromyalgia 07/09/2018  . Trigger thumb, right thumb 06/15/2018  . Lipoma of lower extremity 06/15/2018  . 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 Pelvic pain in female 12/11/2017  . Dyspareunia, female 12/11/2017  . Menometrorrhagia 12/11/2017  . Pain in right ankle and joints of right foot 10/29/2017  . Chronic pain of right knee 10/29/2017  . Right elbow pain 10/29/2017  . Iron deficiency anemia due to chronic blood loss 07/07/2017  . S/P cervical spinal fusion 02/27/2017  . Dysfunctional uterine bleeding 11/24/2016  . Anticoagulated 11/24/2016  . Microcytic  anemia 11/24/2016  . Tachycardia   . Vaginal bleeding   . Allergic rhinoconjunctivitis 07/28/2015  . Chronic headache 07/28/2015  . PCOS (polycystic ovarian syndrome) 06/20/2015  . 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 Hyperlipidemia LDL goal <100 06/20/2015  . GAD (generalized anxiety disorder) 05/18/2014  . GERD (gastroesophageal reflux disease) 05/18/2014  . Body fluid retention 07/07/2013  . Chronic sinusitis 07/02/2013  . Extrinsic asthma 05/20/2013  . HTN (hypertension) 05/20/2013  . Prediabetes 05/20/2013   Past Medical History:  Diagnosis Date  . Anemia   . Anemia   . Anxiety   . Arthritis   . Asthma   . Bilateral calcaneal spurs   . Bilateral polycystic ovarian syndrome   . Bulging lumbar disc   . Carpal tunnel syndrome, bilateral   . Conjunctivitis   . COPD (chronic obstructive pulmonary disease) (HCC)    BRONCHITIS  . Diabetes mellitus without complication ([facility])    TYPE 2   DX  4-5 YRS AGO  . ETD (eustachian tube dysfunction)   . ETD (Eustachian tube dysfunction), bilateral    left worse than right  . Fibroids    uterine  . Fibromyalgia   . GAD (generalized anxiety disorder)   . GERD (gastroesophageal reflux disease)    TAKES PRESCRIPTION MEDS  . Glaucoma    BOTH EYES  . Hematoma    Hematoma  of Left Hand- had to wear a split  . Hyperlipidemia   . Hypertension   . Hypothyroidism    NODULES ON THYROID  . PONV (postoperative nausea and vomiting)   . Pulmonary embolism ([facility])   . Tachycardia   . TMJ (dislocation of temporomandibular joint)   . Trigger finger   . Trigger finger of left thumb     Family History  Problem Relation Age of Onset  . Hypertension Mother   . Hypertension Father     Past Surgical History:  Procedure Laterality Date  . ANTERIOR CERVICAL DECOMP/DISCECTOMY FUSION N/A 11/13/2016   Procedure: Cervical five-six, Cervical six-seven Anterior Cervical Discectomy and Fusion, Allograft, Plate;  Surgeon: Marybelle Killings, MD;   Location: [facility];  Service: Orthopedics;  Laterality: N/A;  . BREAST SURGERY    . CARPAL TUNNEL RELEASE    . DILATION AND CURETTAGE OF UTERUS    . EXPLORATORY LAPAROTOMY    . LIPOMA EXCISION Left 06/15/2018   Procedure: LEFT CALF LIPOMA EXCISION;  Surgeon: Marybelle Killings, MD;  Location: [facility];  Service: Orthopedics;  Laterality: Left;  . NASAL SINUS SURGERY  07/22/13   Dr. Redmond Pulling in [place]  . TRIGGER FINGER RELEASE Right 06/15/2018   Procedure: RIGHT TRIGGER THUMB RELEASE;  Surgeon: Marybelle Killings, MD;  Location: [facility];  Service: Orthopedics;  Laterality: Right;   Social History   Occupational History  . Occupation: Unemployed  Tobacco Use  . Smoking status: Former Smoker    Packs/day: 0.25    Years: 5.00    Pack years: 1.25    Types: Cigarettes    Last attempt to quit: 11/18/1989    Years since quitting: 28.7  . Smokeless tobacco: Never Used  Substance and Sexual Activity  . Alcohol use: No  . Drug use: No  . Sexual activity: Yes    Birth control/protection: None

## 2018-08-03 ENCOUNTER — Telehealth: Payer: Self-pay | Admitting: Pediatrics

## 2018-08-03 MED ORDER — LINACLOTIDE 72 MCG PO CAPS: 72.0000 ug | ORAL_CAPSULE | Freq: Two times a day (BID) | ORAL | 1 refills | Status: DC

## 2018-08-03 NOTE — Telephone Encounter (Signed)
DR Evette Doffing - are you ok with this change?

## 2018-08-03 NOTE — Telephone Encounter (Signed)
Pt aware and med changed and sent in

## 2018-08-03 NOTE — Telephone Encounter (Signed)
Pt wants to talk to nurse about rx change for LINZESS 290 MCG CAPS capsule- she wants 75 mg one in the morning and 75 mg one at night 60 day supply- [facility] please call back [phone] just for today and lm on [phone]

## 2018-08-03 NOTE — Telephone Encounter (Signed)
OK to send in 

## 2018-08-04 ENCOUNTER — Encounter: Payer: Self-pay | Admitting: Allergy and Immunology

## 2018-08-05 ENCOUNTER — Encounter: Payer: Self-pay | Admitting: Allergy and Immunology

## 2018-08-05 ENCOUNTER — Telehealth: Payer: Self-pay | Admitting: Pediatrics

## 2018-08-05 ENCOUNTER — Ambulatory Visit (INDEPENDENT_AMBULATORY_CARE_PROVIDER_SITE_OTHER): Payer: Medicaid Other | Admitting: Allergy and Immunology

## 2018-08-05 VITALS — BP 118/70 | HR 90 | Resp 18 | Ht 62.0 in | Wt 147.0 lb

## 2018-08-05 DIAGNOSIS — H1010 Acute atopic conjunctivitis, unspecified eye: Secondary | ICD-10-CM

## 2018-08-05 DIAGNOSIS — J3089 Other allergic rhinitis: Secondary | ICD-10-CM | POA: Diagnosis not present

## 2018-08-05 DIAGNOSIS — J301 Allergic rhinitis due to pollen: Secondary | ICD-10-CM | POA: Diagnosis not present

## 2018-08-05 DIAGNOSIS — J4550 Severe persistent asthma, uncomplicated: Secondary | ICD-10-CM | POA: Diagnosis not present

## 2018-08-05 DIAGNOSIS — K219 Gastro-esophageal reflux disease without esophagitis: Secondary | ICD-10-CM

## 2018-08-05 MED ORDER — PREDNISONE 10 MG PO TABS: 5.0000 mg | ORAL_TABLET | Freq: Every day | ORAL | 0 refills | Status: AC

## 2018-08-05 MED ORDER — BUDESONIDE-FORMOTEROL FUMARATE 80-4.5 MCG/ACT IN AERO: 1.0000 | INHALATION_SPRAY | Freq: Every day | RESPIRATORY_TRACT | 5 refills | Status: DC

## 2018-08-05 NOTE — Patient Instructions (Addendum)
  1. Continue Immunotherapy and Epi-Pen   2. Continue to Treat and prevent inflammation:   A.  Symbicort 160 - 1-2 inhalations 1 time per day  B.  Flonase- 1 spray each nostril 3 times a week    C.  montelukast 10 mg - one tablet one time per day  D.  Nucala injections every 4 weeks  E.  Prednisone 10mg  tablet - 1/2 tablet one time per day for 10 days only  3. Continue to Treat and prevent reflux:   A. pantoprazole 40 mg in a.m.  4. If needed:   A. Patanase 2 sprays each nostril two times per day  B. OTC antihistamine - Zyrtec 10mg  1-2 times per day  C. Proventil HFA or similar 2 inhalations every 4-6 hours  5. Continue eye therapy with Dr. Delman Cheadle.  6. ENT evaluation for ETD. Tube placement?  7. Return in 12 weeks or earlier if problem  8. Obtain fall flu vaccine

## 2018-08-05 NOTE — Progress Notes (Signed)
Follow-up Note  Referring Provider: Eustaquio Maize, MD Primary Provider: Eustaquio Maize, MD Date of Office Visit: 08/05/2018  Subjective:   Jennifer Mullins (DOB: Dec 23, 1970) is a 47 y.o. female who returns to the [facility] [place] on 08/05/2018 in re-evaluation of the following:  HPI: Desiraye presents to this clinic in evaluation of asthma and allergic rhinitis and allergic conjunctivitis with a component of vernal conjunctivitis and dry eye syndrome and ETD and LPR.  Her last visit to this clinic was 05 May 2018.  Her asthma is under very good control at this point in time.  She is using a very low dose of Symbicort 160 at 1 inhalation 1 time per day and continues to use mepolizumab injections and has no significant lower airway symptoms and does not need to use a short acting bronchodilator and can exert herself without a problem.  She has not required a systemic steroid since being seen in this clinic last.  She did attempt to use Asmanex but could not tolerate this medication as it gave rise to coughing episodes.  Her nose is doing relatively well though she does develop some intermittent nasal congestion on occasion especially in the morning.  Her neighbor mowed the grass a few weeks ago and she did develop some issues with nasal congestion.  More significantly, she developed problems with ear fullness and feeling as though there is "cotton" in both her ears and ear popping which has been a long-standing issue for her in the past.  She is presently using nasal fluticasone at 3 times per week.  She is somewhat limited in her ability to use nasal fluticasone because of her glaucoma which appears to be very steroid responsive.  She does continue on Patanase on a regular basis.  Recently she is using topical cromolyn and Restasis for her conjunctivitis.  Her reflux is under very good control at this point in time.  She continues on immunotherapy without any adverse  effect.  Allergies as of 08/05/2018      Reactions   Demerol [meperidine] Shortness Of Breath   FLUSHING AND SHORTNESS OF BREATH   Morphine And Related Shortness Of Breath   Flushed and hot hyper Flushed and hot hyper Flushed and hot hyper Flushed and hot hyper   Diltiazem Hcl Hives   Lisinopril    Caused cough   Lyrica [pregabalin]    Chest pain, elevated heart rate   Tramadol Other (See Comments)   Tears up stomach      Medication List      albuterol (2.5 MG/3ML) 0.083% nebulizer solution Commonly known as:  PROVENTIL Take 3 mLs (2.5 mg total) by nebulization every 6 (six) hours as needed for wheezing or shortness of breath.   ALPRAZolam 0.5 MG tablet Commonly known as:  XANAX Take 1 tab twice a day as needed. May take one more tab before allergy shots once a week.   apixaban 2.5 MG Tabs tablet Commonly known as:  ELIQUIS Take 1 tablet (2.5 mg total) by mouth daily.   aspirin 81 MG chewable tablet Chew 81 mg by mouth daily.   AUVI-Q 0.3 mg/0.3 mL Soaj injection Generic drug:  EPINEPHrine Use as directed for life-threatening allergic reaction.   baclofen 10 MG tablet Commonly known as:  LIORESAL Take 10 mg by mouth 3 (three) times daily as needed.   budesonide-formoterol 80-4.5 MCG/ACT inhaler Commonly known as:  SYMBICORT Inhale 1-2 puffs into the lungs See admin instructions. 1-2  times daily   cetirizine 10 MG tablet Commonly known as:  ZYRTEC Take 1 tablet (10 mg total) by mouth 2 (two) times daily.   cromolyn 4 % ophthalmic solution Commonly known as:  OPTICROM Place 1 drop into both eyes 4 (four) times daily.   cycloSPORINE 0.05 % ophthalmic emulsion Commonly known as:  RESTASIS Place 1 drop into both eyes 2 (two) times daily.   Diethylpropion HCl CR 75 MG Tb24 TK 1 T PO QD   EPIDUO 0.1-2.5 % gel Generic drug:  Adapalene-Benzoyl Peroxide AFFECTED AREA(S) ON FACE AND CHEST EVERY NIGHT AT BEDTIME   fluconazole 150 MG tablet Commonly known as:   DIFLUCAN TAKE 1 TABLET BY MOUTH ONCE FOR ONE DOSE, TAKE THE SECOND TABLET THREE DAYS AFTER THE FIRST ONE   fluticasone 50 MCG/ACT nasal spray Commonly known as:  FLONASE 1-2 sprays in each nostril 3 times weekly as needed.   furosemide 20 MG tablet Commonly known as:  LASIX TAKE ONE TABLET BY MOUTH AS NEEDED for fluid   HYDROcodone-acetaminophen 7.5-325 MG tablet Commonly known as:  NORCO Take 1 tablet by mouth 3 (three) times daily as needed for moderate pain.   IRON PO Take 1 tablet by mouth daily as needed (for low hemoglobin levels). Take when hemoglobin is low   linaclotide 72 MCG capsule Commonly known as:  LINZESS Take 1 capsule (72 mcg total) by mouth 2 (two) times daily.   losartan 25 MG tablet Commonly known as:  COZAAR Take 1 tablet (25 mg total) by mouth 2 (two) times daily.   medroxyPROGESTERone 10 MG tablet Commonly known as:  PROVERA Take 1 tablet (10 mg total) by mouth daily.   metFORMIN 500 MG tablet Commonly known as:  GLUCOPHAGE Take 1 tablet (500 mg total) by mouth daily with breakfast.   metroNIDAZOLE 500 MG tablet Commonly known as:  FLAGYL Take 250 mg by mouth 3 (three) times daily.   montelukast 10 MG tablet Commonly known as:  SINGULAIR TAKE ONE TABLET BY MOUTH AT BEDTIME   NUCALA [place] Inject into the skin every 28 (twenty-eight) days.   Olopatadine HCl 0.6 % Soln Place 2 sprays into both nostrils 2 (two) times daily.   pantoprazole 40 MG tablet Commonly known as:  PROTONIX TAKE ONE TABLET BY MOUTH DAILY   potassium chloride SA 20 MEQ tablet Commonly known as:  K-DUR,KLOR-CON TAKE TWO TABLETS BY MOUTH DAILY FOR 3 DAYS, THEN TAKE 1 TABLET BY MOUTH DAILY WHILE TAKING LASIX   pravastatin 20 MG tablet Commonly known as:  PRAVACHOL Take 1 tablet (20 mg total) by mouth daily.   SM COMPLETE ADVANCED FORMULA Tabs Take by mouth.   VANIQA 13.9 % cream Generic drug:  Eflornithine HCl APPLY TO AFFECTED AREA TWICE DAILY       Past  Medical History:  Diagnosis Date  . Anemia   . Anemia   . Anxiety   . Arthritis   . Asthma   . Bilateral calcaneal spurs   . Bilateral polycystic ovarian syndrome   . Bulging lumbar disc   . Carpal tunnel syndrome, bilateral   . Conjunctivitis   . COPD (chronic obstructive pulmonary disease) (HCC)    BRONCHITIS  . Diabetes mellitus without complication ([place])    TYPE 2   DX  4-5 YRS AGO  . ETD (eustachian tube dysfunction)   . ETD (Eustachian tube dysfunction), bilateral    left worse than right  . Fibroids    uterine  . Fibromyalgia   .  GAD (generalized anxiety disorder)   . GERD (gastroesophageal reflux disease)    TAKES PRESCRIPTION MEDS  . Glaucoma    BOTH EYES  . Hematoma    Hematoma of Left Hand- had to wear a split  . Hyperlipidemia   . Hypertension   . Hypothyroidism    NODULES ON THYROID  . PONV (postoperative nausea and vomiting)   . Pulmonary embolism ([place])   . Tachycardia   . TMJ (dislocation of temporomandibular joint)   . Trigger finger   . Trigger finger of left thumb     Past Surgical History:  Procedure Laterality Date  . ANTERIOR CERVICAL DECOMP/DISCECTOMY FUSION N/A 11/13/2016   Procedure: Cervical five-six, Cervical six-seven Anterior Cervical Discectomy and Fusion, Allograft, Plate;  Surgeon: Marybelle Killings, MD;  Location: [facility];  Service: Orthopedics;  Laterality: N/A;  . BREAST SURGERY    . CARPAL TUNNEL RELEASE    . DILATION AND CURETTAGE OF UTERUS    . EXPLORATORY LAPAROTOMY    . LIPOMA EXCISION Left 06/15/2018   Procedure: LEFT CALF LIPOMA EXCISION;  Surgeon: Marybelle Killings, MD;  Location: [facility];  Service: Orthopedics;  Laterality: Left;  . NASAL SINUS SURGERY  07/22/13   Dr. Redmond Pulling in [place]  . TRIGGER FINGER RELEASE Right 06/15/2018   Procedure: RIGHT TRIGGER THUMB RELEASE;  Surgeon: Marybelle Killings, MD;  Location: [facility];  Service: Orthopedics;  Laterality: Right;    Review of systems negative except as  noted in HPI / PMHx or noted below:  Review of Systems  Constitutional: Negative.   HENT: Negative.   Eyes: Negative.   Respiratory: Negative.   Cardiovascular: Negative.   Gastrointestinal: Negative.   Genitourinary: Negative.   Musculoskeletal: Negative.   Skin: Negative.   Neurological: Negative.   Endo/Heme/Allergies: Negative.   Psychiatric/Behavioral: Negative.      Objective:   Vitals:   08/05/18 0845  BP: 118/70  Pulse: 90  Resp: 18  SpO2: 99%   Height: 5\' 2"  (157.5 cm)  Weight: 147 lb (66.7 kg)   Physical Exam  HENT:  Head: Normocephalic.  Right Ear: External ear and ear canal normal. Tympanic membrane mobility is abnormal (Lack of pneumatic movement).  Left Ear: External ear and ear canal normal. Tympanic membrane mobility is abnormal (Lack of pneumatic movement).  Nose: Nose normal. No mucosal edema or rhinorrhea.  Mouth/Throat: Uvula is midline, oropharynx is clear and moist and mucous membranes are normal. No oropharyngeal exudate.  Eyes: Conjunctivae are normal.  Neck: Trachea normal. No tracheal tenderness present. No tracheal deviation present. No thyromegaly present.  Cardiovascular: Normal rate, regular rhythm, S1 normal, S2 normal and normal heart sounds.  No murmur heard. Pulmonary/Chest: Breath sounds normal. No stridor. No respiratory distress. She has no wheezes. She has no rales.  Musculoskeletal: She exhibits no edema.  Lymphadenopathy:       Head (right side): No tonsillar adenopathy present.       Head (left side): No tonsillar adenopathy present.    She has no cervical adenopathy.  Neurological: She is alert.  Skin: No rash noted. She is not diaphoretic. No erythema. Nails show no clubbing.    Diagnostics:    Spirometry was performed and demonstrated an FEV1 of 2.12 at 98 % of predicted.  The patient had an Asthma Control Test with the following results: ACT Total Score: 21.    Assessment and Plan:   1. Asthma, severe persistent,  well-controlled   2. Perennial allergic  rhinitis   3. Seasonal allergic rhinitis due to pollen   4. Seasonal allergic conjunctivitis   5. LPRD (laryngopharyngeal reflux disease)     1. Continue Immunotherapy and Epi-Pen   2. Continue to Treat and prevent inflammation:   A.  Symbicort 160 - 1-2 inhalations 1 time per day  B.  Flonase- 1 spray each nostril 3 times a week    C.  montelukast 10 mg - one tablet one time per day  D.  Nucala injections every 4 weeks  E.  Prednisone 10mg  tablet - 1/2 tablet one time per day for 10 days only  3. Continue to Treat and prevent reflux:   A. pantoprazole 40 mg in a.m.  4. If needed:   A. Patanase 2 sprays each nostril two times per day  B. OTC antihistamine - Zyrtec 10mg  1-2 times per day  C. Proventil HFA or similar 2 inhalations every 4-6 hours  5. Continue eye therapy with Dr. Delman Cheadle.  6. ENT evaluation for ETD. Tube placement?  7. Return in 12 weeks or earlier if problem  8. Obtain fall flu vaccine  Kadian appears to have pretty good control of her respiratory tract inflammatory condition and good control her reflux but she does have ETD and this really bothers her quite a lot and I have given her a low dose of systemic steroids to help with this issue and we will refer her onto ENT to see if she qualifies for tube placement.  I will see her back in his clinic in 12 weeks or earlier if there is a problem.  Allena Katz, MD Allergy / Immunology [place]

## 2018-08-06 ENCOUNTER — Telehealth: Payer: Self-pay

## 2018-08-06 ENCOUNTER — Encounter: Payer: Self-pay | Admitting: Allergy and Immunology

## 2018-08-06 NOTE — Telephone Encounter (Signed)
Referral placed to [facility]

## 2018-08-06 NOTE — Telephone Encounter (Signed)
Does not want to see Dr. Benjamine Mola. Refer to [facility]

## 2018-08-06 NOTE — Telephone Encounter (Signed)
Patient sees Dr Benjamine Mola. She was last seen in May 2019. Will refer her back to their office.

## 2018-08-06 NOTE — Telephone Encounter (Signed)
-----   Message from Rosalio Loud, [place] sent at 08/05/2018  9:21 AM EDT ----- Regarding: referral Please refer patient to [facility] for eustachian tube dysfunction. ?tube replacement?

## 2018-08-10 ENCOUNTER — Ambulatory Visit (INDEPENDENT_AMBULATORY_CARE_PROVIDER_SITE_OTHER): Payer: Medicaid Other

## 2018-08-10 DIAGNOSIS — J309 Allergic rhinitis, unspecified: Secondary | ICD-10-CM

## 2018-08-13 ENCOUNTER — Other Ambulatory Visit: Payer: Self-pay | Admitting: *Deleted

## 2018-08-13 ENCOUNTER — Telehealth: Payer: Self-pay | Admitting: Pediatrics

## 2018-08-13 DIAGNOSIS — K219 Gastro-esophageal reflux disease without esophagitis: Secondary | ICD-10-CM

## 2018-08-13 MED ORDER — PANTOPRAZOLE SODIUM 40 MG PO TBEC: 40.0000 mg | DELAYED_RELEASE_TABLET | Freq: Every day | ORAL | 0 refills | Status: DC

## 2018-08-14 NOTE — Telephone Encounter (Signed)
Talked to patient on 08/13/18 about PCS (personal care services) will have another form filled out for Dr. Evette Doffing to do a change of status request to be faxed to [facility], informed patient that it is up to [place] as to how many hours she received.

## 2018-08-19 ENCOUNTER — Ambulatory Visit (INDEPENDENT_AMBULATORY_CARE_PROVIDER_SITE_OTHER): Payer: Medicaid Other | Admitting: *Deleted

## 2018-08-19 DIAGNOSIS — J4550 Severe persistent asthma, uncomplicated: Secondary | ICD-10-CM | POA: Diagnosis not present

## 2018-08-20 ENCOUNTER — Other Ambulatory Visit: Payer: Self-pay | Admitting: Pediatrics

## 2018-08-20 DIAGNOSIS — M797 Fibromyalgia: Secondary | ICD-10-CM

## 2018-08-21 ENCOUNTER — Ambulatory Visit (INDEPENDENT_AMBULATORY_CARE_PROVIDER_SITE_OTHER): Payer: Medicaid Other

## 2018-08-21 DIAGNOSIS — J309 Allergic rhinitis, unspecified: Secondary | ICD-10-CM

## 2018-08-24 ENCOUNTER — Ambulatory Visit: Payer: Medicaid Other | Admitting: Pediatrics

## 2018-08-24 DIAGNOSIS — S0300XA Dislocation of jaw, unspecified side, initial encounter: Secondary | ICD-10-CM | POA: Insufficient documentation

## 2018-08-28 ENCOUNTER — Ambulatory Visit (INDEPENDENT_AMBULATORY_CARE_PROVIDER_SITE_OTHER): Payer: Medicaid Other

## 2018-08-28 DIAGNOSIS — J309 Allergic rhinitis, unspecified: Secondary | ICD-10-CM | POA: Diagnosis not present

## 2018-09-02 ENCOUNTER — Other Ambulatory Visit: Payer: Self-pay | Admitting: Pediatrics

## 2018-09-02 DIAGNOSIS — K5900 Constipation, unspecified: Secondary | ICD-10-CM

## 2018-09-06 ENCOUNTER — Encounter (HOSPITAL_COMMUNITY): Payer: Self-pay | Admitting: Emergency Medicine

## 2018-09-06 ENCOUNTER — Emergency Department (HOSPITAL_COMMUNITY): Payer: Medicaid Other

## 2018-09-06 ENCOUNTER — Other Ambulatory Visit: Payer: Self-pay

## 2018-09-06 ENCOUNTER — Emergency Department (HOSPITAL_COMMUNITY)
Admission: EM | Admit: 2018-09-06 | Discharge: 2018-09-06 | Disposition: A | Discharge status: Home / Self Care | Payer: Medicaid Other | Attending: Emergency Medicine | Admitting: Emergency Medicine

## 2018-09-06 DIAGNOSIS — Z79899 Other long term (current) drug therapy: Secondary | ICD-10-CM | POA: Insufficient documentation

## 2018-09-06 DIAGNOSIS — Y939 Activity, unspecified: Secondary | ICD-10-CM | POA: Diagnosis not present

## 2018-09-06 DIAGNOSIS — I252 Old myocardial infarction: Secondary | ICD-10-CM | POA: Insufficient documentation

## 2018-09-06 DIAGNOSIS — Y998 Other external cause status: Secondary | ICD-10-CM | POA: Diagnosis not present

## 2018-09-06 DIAGNOSIS — S99921A Unspecified injury of right foot, initial encounter: Secondary | ICD-10-CM | POA: Diagnosis present

## 2018-09-06 DIAGNOSIS — E039 Hypothyroidism, unspecified: Secondary | ICD-10-CM | POA: Diagnosis not present

## 2018-09-06 DIAGNOSIS — S90211A Contusion of right great toe with damage to nail, initial encounter: Secondary | ICD-10-CM | POA: Insufficient documentation

## 2018-09-06 DIAGNOSIS — I10 Essential (primary) hypertension: Secondary | ICD-10-CM | POA: Insufficient documentation

## 2018-09-06 DIAGNOSIS — E785 Hyperlipidemia, unspecified: Secondary | ICD-10-CM | POA: Diagnosis not present

## 2018-09-06 DIAGNOSIS — E119 Type 2 diabetes mellitus without complications: Secondary | ICD-10-CM | POA: Diagnosis not present

## 2018-09-06 DIAGNOSIS — Z86718 Personal history of other venous thrombosis and embolism: Secondary | ICD-10-CM | POA: Diagnosis not present

## 2018-09-06 DIAGNOSIS — S90221A Contusion of right lesser toe(s) with damage to nail, initial encounter: Secondary | ICD-10-CM

## 2018-09-06 DIAGNOSIS — J449 Chronic obstructive pulmonary disease, unspecified: Secondary | ICD-10-CM | POA: Insufficient documentation

## 2018-09-06 DIAGNOSIS — Z7982 Long term (current) use of aspirin: Secondary | ICD-10-CM | POA: Diagnosis not present

## 2018-09-06 DIAGNOSIS — Y92512 Supermarket, store or market as the place of occurrence of the external cause: Secondary | ICD-10-CM | POA: Insufficient documentation

## 2018-09-06 DIAGNOSIS — W208XXA Other cause of strike by thrown, projected or falling object, initial encounter: Secondary | ICD-10-CM | POA: Insufficient documentation

## 2018-09-06 MED ORDER — LIDOCAINE HCL (PF) 1 % IJ SOLN
5.0000 mL | Freq: Once | INTRAMUSCULAR | Status: AC
  Administered 2018-09-06: 5 mL via INTRADERMAL
  Filled 2018-09-06: qty 6

## 2018-09-06 MED ORDER — ONDANSETRON 4 MG PO TBDP: 4.0000 mg | ORAL_TABLET | Freq: Once | ORAL | Status: DC

## 2018-09-06 NOTE — Discharge Instructions (Addendum)
Contact your primary care doctorif: Your pain is not controlled with medicine. You have redness, swelling, or pain around your nail. Get help right away if: You have pus coming from your nail. You have a fever.

## 2018-09-06 NOTE — ED Triage Notes (Signed)
Patient brought in via EMS. Alert and oriented. Airway patent. Patient c/o right foot pain related to injury to foot. Patient at [facility] shopping for toilet seat, had one in her arm and was getting to remove second one of shelf when it fell off on top of foot. Swelling and bruising to right great and 2nd toe. "Some type of pain patch applied" by [employer], EMS paramedic removed.

## 2018-09-06 NOTE — ED Provider Notes (Addendum)
[facility] EMERGENCY DEPARTMENT Provider Note   CSN: [id number] Arrival date & time: 09/06/18  1516     History   Chief Complaint Chief Complaint  Patient presents with  . Foot Injury    HPI Jennifer Mullins is a 47 y.o. female  Who presents for evaluation of R toe injury. Patient dropped a toilet seat on her 1st/2nd toes at [place] today just PTA. Jennifer Mullins was BIB ambulance. Jennifer Mullins has no numbness or tingling. Jennifer Mullins was unable to bear weight. Jennifer Mullins c/o bruising and swelling behind her big toe nail. Jennifer Mullins denies previous injury.  HPI  Past Medical History:  Diagnosis Date  . Anemia   . Anemia   . Anxiety   . Arthritis   . Asthma   . Bilateral calcaneal spurs   . Bilateral polycystic ovarian syndrome   . Bulging lumbar disc   . Carpal tunnel syndrome, bilateral   . Conjunctivitis   . COPD (chronic obstructive pulmonary disease) (HCC)    BRONCHITIS  . Diabetes mellitus without complication ([place])    TYPE 2   DX  4-5 YRS AGO  . ETD (eustachian tube dysfunction)   . ETD (Eustachian tube dysfunction), bilateral    left worse than right  . Fibroids    uterine  . Fibromyalgia   . GAD (generalized anxiety disorder)   . GERD (gastroesophageal reflux disease)    TAKES PRESCRIPTION MEDS  . Glaucoma    BOTH EYES  . Hematoma    Hematoma of Left Hand- had to wear a split  . Hyperlipidemia   . Hypertension   . Hypothyroidism    NODULES ON THYROID  . PONV (postoperative nausea and vomiting)   . Pulmonary embolism ([place])   . Tachycardia   . TMJ (dislocation of temporomandibular joint)   . Trigger finger   . Trigger finger of left thumb     Patient Active Problem List   Diagnosis Date Noted  . Fibromyalgia 07/09/2018  . Trigger thumb, right thumb 06/15/2018  . Lipoma of lower extremity 06/15/2018  . 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 Pelvic pain in female 12/11/2017  . Dyspareunia, female 12/11/2017  . Menometrorrhagia 12/11/2017  . Pain in right ankle and joints of  right foot 10/29/2017  . Chronic pain of right knee 10/29/2017  . Right elbow pain 10/29/2017  . Iron deficiency anemia due to chronic blood loss 07/07/2017  . S/P cervical spinal fusion 02/27/2017  . Dysfunctional uterine bleeding 11/24/2016  . Anticoagulated 11/24/2016  . Microcytic anemia 11/24/2016  . Tachycardia   . Vaginal bleeding   . Allergic rhinoconjunctivitis 07/28/2015  . Chronic headache 07/28/2015  . PCOS (polycystic ovarian syndrome) 06/20/2015  . 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 Hyperlipidemia LDL goal <100 06/20/2015  . GAD (generalized anxiety disorder) 05/18/2014  . GERD (gastroesophageal reflux disease) 05/18/2014  . Body fluid retention 07/07/2013  . Chronic sinusitis 07/02/2013  . Extrinsic asthma 05/20/2013  . HTN (hypertension) 05/20/2013  . Prediabetes 05/20/2013    Past Surgical History:  Procedure Laterality Date  . ANTERIOR CERVICAL DECOMP/DISCECTOMY FUSION N/A 11/13/2016   Procedure: Cervical five-six, Cervical six-seven Anterior Cervical Discectomy and Fusion, Allograft, Plate;  Surgeon: Marybelle Killings, MD;  Location: [place];  Service: Orthopedics;  Laterality: N/A;  . BREAST SURGERY    . CARPAL TUNNEL RELEASE    . DILATION AND CURETTAGE OF UTERUS    . EXPLORATORY LAPAROTOMY    . LIPOMA EXCISION Left 06/15/2018   Procedure: LEFT CALF LIPOMA EXCISION;  Surgeon: Marybelle Killings, MD;  Location: [place];  Service: Orthopedics;  Laterality: Left;  . NASAL SINUS SURGERY  07/22/13   Dr. Redmond Pulling in [place]  . TRIGGER FINGER RELEASE Right 06/15/2018   Procedure: RIGHT TRIGGER THUMB RELEASE;  Surgeon: Marybelle Killings, MD;  Location: [place];  Service: Orthopedics;  Laterality: Right;     OB History    Gravida  1   Para  1   Term      Preterm  1   AB      Living  1     SAB      TAB      Ectopic      Multiple      Live Births               Home Medications    Prior to Admission medications     Medication Sig Start Date End Date Taking? Authorizing Provider  albuterol (PROVENTIL) (2.5 MG/3ML) 0.083% nebulizer solution Take 3 mLs (2.5 mg total) by nebulization every 6 (six) hours as needed for wheezing or shortness of breath. 06/25/18   Eustaquio Maize, MD  ALPRAZolam Duanne Moron) 0.5 MG tablet Take 1 tab twice a day as needed. May take one more tab before allergy shots once a week. 07/09/18   Eustaquio Maize, MD  aspirin 81 MG chewable tablet Chew 81 mg by mouth daily.    [provider]  AUVI-Q 0.3 MG/0.3ML SOAJ injection Use as directed for life-threatening allergic reaction. 03/14/05   Delora Fuel, MD  baclofen (LIORESAL) 10 MG tablet Take 10 mg by mouth 3 (three) times daily as needed. 05/14/18   [provider]  budesonide-formoterol (SYMBICORT) 80-4.5 MCG/ACT inhaler Inhale 1-2 puffs into the lungs daily. 08/05/18   Kozlow, Donnamarie Poag, MD  cetirizine (ZYRTEC) 10 MG tablet Take 1 tablet (10 mg total) by mouth 2 (two) times daily. 07/02/18   Kozlow, Donnamarie Poag, MD  cromolyn (OPTICROM) 4 % ophthalmic solution Place 1 drop into both eyes 4 (four) times daily.  03/20/17   [provider]  cycloSPORINE (RESTASIS) 0.05 % ophthalmic emulsion Place 1 drop into both eyes 2 (two) times daily.    [provider]  Diethylpropion HCl CR 75 MG TB24 TK 1 T PO QD 05/08/18   [provider]  EPIDUO 0.1-2.5 % gel AFFECTED AREA(S) ON FACE AND CHEST EVERY NIGHT AT BEDTIME 12/16/17   [provider]  fluticasone (FLONASE) 50 MCG/ACT nasal spray 1-2 sprays in each nostril 3 times weekly as needed. 05/08/18   Kozlow, Donnamarie Poag, MD  furosemide (LASIX) 20 MG tablet TAKE ONE TABLET BY MOUTH AS NEEDED for fluid 06/10/18   Eustaquio Maize, MD  HYDROcodone-acetaminophen (NORCO) 7.5-325 MG tablet Take 1 tablet by mouth 3 (three) times daily as needed for moderate pain.    [provider]  IRON PO Take 1 tablet by mouth daily as needed (for low hemoglobin levels). Take when  hemoglobin is low 09/11/17   [provider]  linaclotide (LINZESS) 72 MCG capsule Take 1 capsule (72 mcg total) by mouth 2 (two) times daily. 08/03/18   Eustaquio Maize, MD  losartan (COZAAR) 25 MG tablet Take 1 tablet (25 mg total) by mouth 2 (two) times daily. 04/22/18   Eustaquio Maize, MD  medroxyPROGESTERone (PROVERA) 10 MG tablet Take 1 tablet (10 mg total) by mouth daily. 02/12/18   Florian Buff, MD  Mepolizumab (NUCALA [place])  Inject into the skin every 28 (twenty-eight) days.    [provider]  metFORMIN (GLUCOPHAGE) 500 MG tablet Take 1 tablet (500 mg total) by mouth daily with breakfast. 04/22/18   Eustaquio Maize, MD  montelukast (SINGULAIR) 10 MG tablet TAKE ONE TABLET BY MOUTH AT BEDTIME 05/28/18   Kozlow, Donnamarie Poag, MD  Multiple Vitamins-Minerals (SM COMPLETE ADVANCED FORMULA) TABS Take by mouth. 09/11/17   [provider]  Olopatadine HCl 0.6 % SOLN Place 2 sprays into both nostrils 2 (two) times daily. 04/20/18   Kozlow, Donnamarie Poag, MD  pantoprazole (PROTONIX) 40 MG tablet Take 1 tablet (40 mg total) by mouth daily. 08/13/18   Eustaquio Maize, MD  potassium chloride SA (K-DUR,KLOR-CON) 20 MEQ tablet TAKE TWO TABLETS BY MOUTH DAILY FOR 3 DAYS, THEN TAKE 1 TABLET BY MOUTH DAILY WHILE TAKING LASIX 06/10/18   Eustaquio Maize, MD  pravastatin (PRAVACHOL) 20 MG tablet Take 1 tablet (20 mg total) by mouth daily. 04/22/18   Eustaquio Maize, MD  VANIQA 13.9 % cream APPLY TO AFFECTED AREA TWICE DAILY 12/16/17   [provider]  VOLTAREN 1 % GEL APPLY TWO GRAMS TOPICALLY FOUR TIMES DAILY 08/24/18   Claretta Fraise, MD    Family History Family History  Problem Relation Age of Onset  . Hypertension Mother   . Hypertension Father     Social History Social History   Tobacco Use  . Smoking status: Former Smoker    Packs/day: 0.25    Years: 5.00    Pack years: 1.25    Types: Cigarettes    Last attempt to quit: 11/18/1989    Years since quitting: 28.8  . Smokeless  tobacco: Never Used  Substance Use Topics  . Alcohol use: No  . Drug use: No     Allergies   Demerol [meperidine]; Morphine and related; Diltiazem hcl; Lisinopril; Lyrica [pregabalin]; and Tramadol   Review of Systems Review of Systems  Musculoskeletal: Positive for gait problem. Negative for joint swelling.       Nail pain , bruising  Neurological: Negative for weakness, light-headedness and numbness.       Physical Exam Updated Vital Signs BP 130/74 (BP Location: Left Arm)   Pulse 86   Temp 98.4 F (36.9 C) (Oral)   Resp 15   Ht 5\' 2"  (1.575 m)   Wt 66.7 kg   LMP 08/22/2018   SpO2 97%   BMI 26.89 kg/m   Physical Exam  Constitutional: Jennifer Mullins is oriented to person, place, and time. Jennifer Mullins appears well-developed and well-nourished. No distress.  HENT:  Head: Normocephalic and atraumatic.  Eyes: Conjunctivae are normal. No scleral icterus.  Neck: Normal range of motion.  Cardiovascular: Normal rate, regular rhythm and normal heart sounds. Exam reveals no gallop and no friction rub.  No murmur heard. Pulmonary/Chest: Effort normal and breath sounds normal. No respiratory distress.  Abdominal: Soft. Bowel sounds are normal. Jennifer Mullins exhibits no distension and no mass. There is no tenderness. There is no guarding.  Musculoskeletal:  R great toe TTP No obvious bruising or deformity. Minimal bruising at the distal toe. No obvious bruising beyond the nail margin  Neurological: Jennifer Mullins is alert and oriented to person, place, and time.  Skin: Skin is warm and dry. Jennifer Mullins is not diaphoretic.  Psychiatric: Her behavior is normal.  Nursing note and vitals reviewed.    ED Treatments / Results  Labs (all labs ordered are listed, but only abnormal results are displayed) Labs Reviewed -  No data to display  EKG None  Radiology Dg Foot Complete Right  Result Date: 09/06/2018 CLINICAL DATA:  A toilet seat fell on the patient's right foot today with onset of pain. Initial encounter. EXAM:  RIGHT FOOT COMPLETE - 3+ VIEW COMPARISON:  Plain films right foot 09/02/2017. FINDINGS: There is no evidence of fracture or dislocation. There is no evidence of arthropathy or other focal bone abnormality. Small plantar calcaneal spur noted. Soft tissues are unremarkable. IMPRESSION: Negative exam. Electronically Signed   By: Inge Rise M.D.   On: 09/06/2018 16:59    Procedures .Nail Removal Date/Time: 09/06/2018 5:58 PM Performed by: Margarita Mail, PA-C Authorized by: Margarita Mail, PA-C   Consent:    Consent obtained:  Verbal Pre-procedure details:    Skin preparation:  Betadine Anesthesia (see MAR for exact dosages):    Anesthesia method:  Nerve block   Block needle gauge:  25 G   Block anesthetic:  Lidocaine 1% w/o epi (4)   Block injection procedure:  Anatomic landmarks identified   Block outcome:  Anesthesia achieved Trephination:    Subungual hematoma drained: yes     Trephination instrument:  Cautery Post-procedure details:    Dressing:  Post-op shoe and 4x4 sterile gauze   Patient tolerance of procedure:  Tolerated well, no immediate complications   (including critical care time)  Medications Ordered in ED Medications  lidocaine (PF) (XYLOCAINE) 1 % injection 5 mL (5 mLs Intradermal Given 09/06/18 1719)     Initial Impression / Assessment and Plan / ED Course  I have reviewed the triage vital signs and the nursing notes.  Pertinent labs & imaging results that were available during my care of the patient were reviewed by me and considered in my medical decision making (see chart for details).     47 year old female with subungual hematoma of the right great toe.  History is gathered from the patient I reviewed the patient's x-ray and saw no evidence of underlying fracture.  Agree with the radiologic interpretation.  I had a long discussion with the patient as there was very minimal blood under the nail and it was predominantly below the nail margin however  Jennifer Mullins was insistent upon a trephination.  Very little blood came out, but the patient does have some relief of the pressure in her foot.  Jennifer Mullins has hydrocodone at home.  Jennifer Mullins has a primary care doctor and can follow-up closely with her.  Jennifer Mullins is given a postop shoe and crutches.  Jennifer Mullins appears appropriate for discharge at this time.  Final Clinical Impressions(s) / ED Diagnoses   Final diagnoses:  Subungual hematoma of right foot, initial encounter    ED Discharge Orders    None       Margarita Mail, PA-C 09/06/18 1806    Daleen Bo, MD 09/06/18 2246    Margarita Mail, PA-C 09/22/18 1715    Daleen Bo, MD 09/23/18 2132

## 2018-09-07 ENCOUNTER — Telehealth: Payer: Self-pay | Admitting: Pediatrics

## 2018-09-07 NOTE — Telephone Encounter (Signed)
Patient scheduled with Dr. Evette Doffing tomorrow at 11:15 am, patient aware.

## 2018-09-07 NOTE — Telephone Encounter (Signed)
PT is scheduled to see Jennifer Mullins 10/22 for ER f/u wants to know if she can be worked in with Dr Evette Doffing tomorrow instead.

## 2018-09-08 ENCOUNTER — Ambulatory Visit: Payer: Medicaid Other | Admitting: Pediatrics

## 2018-09-08 ENCOUNTER — Telehealth: Payer: Self-pay | Admitting: Pediatrics

## 2018-09-08 ENCOUNTER — Ambulatory Visit: Payer: Medicaid Other | Admitting: Family

## 2018-09-08 NOTE — Telephone Encounter (Signed)
Please advise if this is okay to schedule at this time ?

## 2018-09-08 NOTE — Telephone Encounter (Signed)
appt scheduled Pt notified 

## 2018-09-09 ENCOUNTER — Ambulatory Visit: Payer: Medicaid Other | Admitting: Pediatrics

## 2018-09-09 ENCOUNTER — Encounter: Payer: Self-pay | Admitting: Pediatrics

## 2018-09-09 VITALS — BP 127/83 | HR 93 | Temp 98.4°F | Ht 62.0 in | Wt 150.0 lb

## 2018-09-09 DIAGNOSIS — S90221A Contusion of right lesser toe(s) with damage to nail, initial encounter: Secondary | ICD-10-CM

## 2018-09-09 DIAGNOSIS — I10 Essential (primary) hypertension: Secondary | ICD-10-CM

## 2018-09-09 DIAGNOSIS — S90221S Contusion of right lesser toe(s) with damage to nail, sequela: Secondary | ICD-10-CM

## 2018-09-09 DIAGNOSIS — N938 Other specified abnormal uterine and vaginal bleeding: Secondary | ICD-10-CM | POA: Diagnosis not present

## 2018-09-09 NOTE — Progress Notes (Signed)
  Subjective:   Patient ID: Jennifer Mullins, female    DOB: [birth year], 47 y.o.   MRN: [id number] CC: Follow-up (ER follow up)  HPI: Jennifer Mullins is a 47 y.o. female   Seen by her gynecologist earlier this week.  She continues to have heavy periods with clotting.  She says she is being scheduled to have an endometrial ablation in the future.  Hemoglobin was down to 8.7 gynecologist.  She got 1 unit of pRBCs and portion of a second unit yesterday.  She is feeling slightly stronger today, less weak.  She was seen at [facility] emergency room for subungual hematoma of right great toe after dropping a toilet seat on the toe at [facility].  X-ray was without evidence of fracture.  Because of the pain she was having in the toenail, she did get trephination of the nail.  Very little blood came out per ED note, she did have relief of the pressure in the toe.  She is now wearing a postop shoe and using crutches.  She says she was told the toenail will probably fall off in the next couple weeks.  Hypertension: Taking losartan usually twice a day.  She does check her blood pressure before taking it.  If it is low she skips that dose of losartan.  She is still recovering from recent lipoma surgery on left lower leg and trigger thumb surgery on right hand.  Has been affecting her ability to do housework, personal care including bathing.  Relevant past medical, surgical, family and social history reviewed. Allergies and medications reviewed and updated. Social History   Tobacco Use  Smoking Status Former Smoker  . Packs/day: 0.25  . Years: 5.00  . Pack years: 1.25  . Types: Cigarettes  . Last attempt to quit: 11/18/1989  . Years since quitting: 28.8  Smokeless Tobacco Never Used   ROS: Per HPI   Objective:    BP 127/83   Pulse 93   Temp 98.4 F (36.9 C)   Ht 5\' 2"  (1.575 m)   Wt 150 lb (68 kg)   LMP 08/22/2018   BMI 27.44 kg/m   Wt Readings from Last 3 Encounters:  09/09/18 150 lb (68 kg)    09/06/18 147 lb (66.7 kg)  08/05/18 147 lb (66.7 kg)    Gen: NAD, alert, cooperative with exam, NCAT EYES: EOMI, no conjunctival injection, or no icterus CV: NRRR, normal S1/S2, no murmur, distal pulses 2+ b/l Resp: CTABL, no wheezes, normal WOB Abd: +BS, soft, NTND. Ext: No edema, warm Neuro: Alert and oriented, strength equal b/l UE and LE, coordination grossly normal MSK: Right great toenail with scant amount of bright red blood red edges of toenail.  Bruising present at and proximal to nailbed.  Assessment & Plan:  Betsi was seen today for follow-up.  Diagnoses and all orders for this visit:  Dysfunctional uterine bleeding Following with gynecology.  Planning to have likely ablation procedure done soon, date has not been set.  Essential hypertension Stable, continue current medicines  Toenail bruise, right, sequela Had trephination done 10/20.  Currently in postop shoe, using crutches.  Okay to increase weightbearing as tolerated.  Return precautions discussed including any worsening redness, swelling, pain.  I spent 25 minutes with the patient with over 50% of the encounter time dedicated to counseling on the above problems.   Follow up plan: Return in about 3 weeks (around 09/30/2018). [name], MD [place]

## 2018-09-10 ENCOUNTER — Ambulatory Visit: Payer: Medicaid Other | Admitting: Neurology

## 2018-09-11 ENCOUNTER — Ambulatory Visit (INDEPENDENT_AMBULATORY_CARE_PROVIDER_SITE_OTHER): Payer: Medicaid Other

## 2018-09-11 DIAGNOSIS — J309 Allergic rhinitis, unspecified: Secondary | ICD-10-CM | POA: Diagnosis not present

## 2018-09-16 ENCOUNTER — Ambulatory Visit (INDEPENDENT_AMBULATORY_CARE_PROVIDER_SITE_OTHER): Payer: Medicaid Other | Admitting: *Deleted

## 2018-09-16 DIAGNOSIS — J4550 Severe persistent asthma, uncomplicated: Secondary | ICD-10-CM | POA: Diagnosis not present

## 2018-09-22 ENCOUNTER — Telehealth: Payer: Self-pay

## 2018-09-22 NOTE — Telephone Encounter (Signed)
Medicaid non preferred Diclofenac gel   Preferred are Ibuprofen tab., indomethacin cap., ketorolac tab., meloxicam tab., naproxen EC tab., sulindac tab.

## 2018-09-23 DIAGNOSIS — J301 Allergic rhinitis due to pollen: Secondary | ICD-10-CM | POA: Diagnosis not present

## 2018-09-23 NOTE — Progress Notes (Signed)
VIALS EXP 09-24-19

## 2018-09-24 ENCOUNTER — Ambulatory Visit: Payer: Medicaid Other | Admitting: Pediatrics

## 2018-09-25 ENCOUNTER — Ambulatory Visit (INDEPENDENT_AMBULATORY_CARE_PROVIDER_SITE_OTHER): Payer: Medicaid Other | Admitting: *Deleted

## 2018-09-25 DIAGNOSIS — J309 Allergic rhinitis, unspecified: Secondary | ICD-10-CM

## 2018-09-30 ENCOUNTER — Encounter: Payer: Self-pay | Admitting: Pediatrics

## 2018-09-30 ENCOUNTER — Ambulatory Visit: Payer: Medicaid Other | Admitting: Pediatrics

## 2018-09-30 ENCOUNTER — Ambulatory Visit (INDEPENDENT_AMBULATORY_CARE_PROVIDER_SITE_OTHER): Payer: Medicaid Other

## 2018-09-30 VITALS — BP 134/89 | HR 104 | Temp 98.6°F | Ht 62.0 in | Wt 147.0 lb

## 2018-09-30 DIAGNOSIS — M79671 Pain in right foot: Secondary | ICD-10-CM

## 2018-09-30 DIAGNOSIS — F411 Generalized anxiety disorder: Secondary | ICD-10-CM | POA: Diagnosis not present

## 2018-09-30 NOTE — Progress Notes (Signed)
  Subjective:   Patient ID: Jennifer Mullins, female    DOB: [birth year], 47 y.o.   MRN: [id number] CC: Medical Management of Chronic Issues (3 week recheck)  HPI: Jennifer Mullins is a 47 y.o. female   Foot injury 3 weeks ago: Dropped heavy objec ton foot three weeks ago. Continues to have pain and swelling in foot. Back in crutches and still in post-op shoe.   DUB: planning to have upcoming procedure for bleeding. Hg 11.4 in ED on 09/20/2018. Bleeding has slowed over past few days, very light yesterday, none today. Has follow up this week with gynecology.  Fibromyalgia and chronic pain: following with [facility] medical for chronic pain management  Anxiety: symptoms stable now on current regimen, taking xanax twice a day  Relevant past medical, surgical, family and social history reviewed. Allergies and medications reviewed and updated. Social History   Tobacco Use  Smoking Status Former Smoker  . Packs/day: 0.25  . Years: 5.00  . Pack years: 1.25  . Types: Cigarettes  . Last attempt to quit: 11/18/1989  . Years since quitting: 28.8  Smokeless Tobacco Never Used   ROS: Per HPI   Objective:    BP 134/89   Pulse (!) 104   Temp 98.6 F (37 C) (Oral)   Ht 5\' 2"  (1.575 m)   Wt 147 lb (66.7 kg)   BMI 26.89 kg/m   Wt Readings from Last 3 Encounters:  09/30/18 147 lb (66.7 kg)  09/09/18 150 lb (68 kg)  09/06/18 147 lb (66.7 kg)   Gen: NAD, alert, cooperative with exam, NCAT EYES: EOMI, no conjunctival injection, or no icterus ENT: OP without erythema LYMPH: no cervical LAD CV: NRRR, normal S1/S2, no murmur, distal pulses 2+ b/l Resp: CTABL, no wheezes, normal WOB Ext: No edema, warm Neuro: Alert and oriented MSK: R ankle and foot slightly swollen compared to L. No pitting edema. TTP throughout foot, along all metatarsals, +forefoot squeeze Skin: no bruising over R foot.   Assessment & Plan:  [name] was seen today for medical management of chronic issues.  Diagnoses and all  orders for this visit:  Right foot pain Will get xray, still in significant amount of pain in foot after dropped object injury over 3 weeks ago. Xray at time of injury was negative. -     DG Foot Complete Right; Future  GAD (generalized anxiety disorder) Stable. Continue current meds, has 2 additional refills on xanax, last picked up 09/18/2018.  Follow up plan: Return in about 3 months (around 12/19/2018). Will get repeat cholesterol levels, A1c then, also will be due for colonoscopy. [name], MD [place]

## 2018-10-01 ENCOUNTER — Telehealth: Payer: Self-pay | Admitting: *Deleted

## 2018-10-01 NOTE — Telephone Encounter (Signed)
-----   Message from Eustaquio Maize, MD sent at 09/30/2018  4:59 PM EST ----- Some arthritis in ankle, no fractures

## 2018-10-08 ENCOUNTER — Other Ambulatory Visit: Payer: Self-pay | Admitting: Allergy and Immunology

## 2018-10-08 ENCOUNTER — Other Ambulatory Visit: Payer: Self-pay | Admitting: Hematology

## 2018-10-09 ENCOUNTER — Ambulatory Visit (INDEPENDENT_AMBULATORY_CARE_PROVIDER_SITE_OTHER): Payer: Medicaid Other

## 2018-10-09 DIAGNOSIS — J309 Allergic rhinitis, unspecified: Secondary | ICD-10-CM | POA: Diagnosis not present

## 2018-10-13 ENCOUNTER — Ambulatory Visit (INDEPENDENT_AMBULATORY_CARE_PROVIDER_SITE_OTHER): Payer: Medicaid Other | Admitting: *Deleted

## 2018-10-13 DIAGNOSIS — J4550 Severe persistent asthma, uncomplicated: Secondary | ICD-10-CM | POA: Diagnosis not present

## 2018-10-14 ENCOUNTER — Ambulatory Visit: Payer: Medicaid Other

## 2018-10-22 ENCOUNTER — Other Ambulatory Visit: Payer: Self-pay | Admitting: Pediatrics

## 2018-10-22 DIAGNOSIS — E282 Polycystic ovarian syndrome: Secondary | ICD-10-CM

## 2018-10-22 DIAGNOSIS — F411 Generalized anxiety disorder: Secondary | ICD-10-CM

## 2018-10-23 ENCOUNTER — Ambulatory Visit (INDEPENDENT_AMBULATORY_CARE_PROVIDER_SITE_OTHER): Payer: Medicaid Other | Admitting: *Deleted

## 2018-10-23 DIAGNOSIS — J309 Allergic rhinitis, unspecified: Secondary | ICD-10-CM

## 2018-10-27 ENCOUNTER — Ambulatory Visit: Payer: Self-pay | Admitting: *Deleted

## 2018-10-27 ENCOUNTER — Encounter: Payer: Self-pay | Admitting: Allergy and Immunology

## 2018-10-27 ENCOUNTER — Ambulatory Visit: Payer: Medicaid Other | Admitting: Allergy and Immunology

## 2018-10-27 VITALS — BP 108/68 | HR 88 | Resp 18

## 2018-10-27 DIAGNOSIS — H1044 Vernal conjunctivitis: Secondary | ICD-10-CM | POA: Diagnosis not present

## 2018-10-27 DIAGNOSIS — H1010 Acute atopic conjunctivitis, unspecified eye: Secondary | ICD-10-CM | POA: Diagnosis not present

## 2018-10-27 DIAGNOSIS — J3089 Other allergic rhinitis: Secondary | ICD-10-CM | POA: Diagnosis not present

## 2018-10-27 DIAGNOSIS — J4550 Severe persistent asthma, uncomplicated: Secondary | ICD-10-CM | POA: Diagnosis not present

## 2018-10-27 DIAGNOSIS — J4540 Moderate persistent asthma, uncomplicated: Secondary | ICD-10-CM

## 2018-10-27 DIAGNOSIS — J309 Allergic rhinitis, unspecified: Secondary | ICD-10-CM

## 2018-10-27 DIAGNOSIS — K219 Gastro-esophageal reflux disease without esophagitis: Secondary | ICD-10-CM

## 2018-10-27 MED ORDER — ALBUTEROL SULFATE (2.5 MG/3ML) 0.083% IN NEBU: 2.5000 mg | INHALATION_SOLUTION | Freq: Four times a day (QID) | RESPIRATORY_TRACT | 0 refills | Status: DC | PRN

## 2018-10-27 MED ORDER — FLUTICASONE PROPIONATE 50 MCG/ACT NA SUSP: NASAL | 5 refills | Status: DC

## 2018-10-27 MED ORDER — PANTOPRAZOLE SODIUM 40 MG PO TBEC: 40.0000 mg | DELAYED_RELEASE_TABLET | Freq: Every day | ORAL | 0 refills | Status: DC

## 2018-10-27 MED ORDER — MONTELUKAST SODIUM 10 MG PO TABS: 10.0000 mg | ORAL_TABLET | Freq: Every day | ORAL | 5 refills | Status: DC

## 2018-10-27 NOTE — Patient Instructions (Signed)
  1. Continue Immunotherapy and Epi-Pen   2. Continue to Treat and prevent inflammation:   A.  Flonase- 1 spray each nostril 3 times a week    B.  montelukast 10 mg - one tablet one time per day  C.  Nucala injections every 4 weeks  D.  DISCONTINUE SYMBICORT  3. Continue to Treat and prevent reflux:   A. pantoprazole 40 mg in a.m.  4. If needed:   A. Patanase 2 sprays each nostril two times per day  B. OTC antihistamine - Zyrtec 10mg  1-2 times per day  C. Proventil HFA or similar 2 inhalations every 4-6 hours  5. Continue eye therapy with Dr. Delman Cheadle.  6. Return in 6 months or earlier if problem

## 2018-10-27 NOTE — Progress Notes (Signed)
Follow-up Note  Referring Provider: Eustaquio Maize, MD Primary Provider: Eustaquio Maize, MD Date of Office Visit: 10/27/2018  Subjective:   Jennifer Mullins (DOB: [birth year]) is a 47 y.o. female who returns to the [facility] on 10/27/2018 in re-evaluation of the following:  HPI: Jennifer Mullins returns to this clinic in reevaluation of her asthma and allergic rhinoconjunctivitis and history of rhinoconjunctivitis and dry eye syndrome and ETD and LPR.  Her last visit to this clinic was 05 August 2018.  Her asthma has done wonderful.  She has been using Symbicort at 1 inhalation 1 time per day and she has had absolutely no problems with her airway while continuing on mepolizumab injections and montelukast.  She can exercise without any difficulty and does not use a short acting bronchodilator and has not required a systemic steroid or antibiotic for any type of airway issue.  Likewise, her nose is really doing quite well.  She does use Flonase just a few times a week.  She continues with Dr. Maurie Mullins eye therapy which at this point in time includes cromolyn and Restasis.  She has seen ENT regarding her ETD and at this point there is an observational approach to this issue.  Reflux has been under excellent control while using her proton pump inhibitor.  She is undergoing a course of immunotherapy without any adverse effects.  Currently she is at every 3 weeks.  She refuses to receive the flu vaccine.  Allergies as of 10/27/2018      Reactions   Demerol [meperidine] Shortness Of Breath   FLUSHING AND SHORTNESS OF BREATH   Morphine And Related Shortness Of Breath   Flushed and hot hyper Flushed and hot hyper Flushed and hot hyper Flushed and hot hyper   Other Shortness Of Breath   Flushed and hot hyper Flushed and hot hyper Flushed and hot hyper Flushed and hot hyper Flushed and hot hyper   Cardizem  [diltiazem Hcl] Other (See Comments)   Diltiazem Hcl Hives   Lisinopril    Caused cough   Lyrica [pregabalin]    Chest pain, elevated heart rate   Tramadol Other (See Comments)   Tears up stomach      Medication List      albuterol (2.5 MG/3ML) 0.083% nebulizer solution Commonly known as:  PROVENTIL Take 3 mLs (2.5 mg total) by nebulization every 6 (six) hours as needed for wheezing or shortness of breath.   ALPRAZolam 0.5 MG tablet Commonly known as:  XANAX Take 1 tab twice a day as needed. May take one more tab before allergy shots once a week.   aspirin 81 MG chewable tablet Chew 81 mg by mouth daily.   AUVI-Q 0.3 mg/0.3 mL Soaj injection Generic drug:  EPINEPHrine Use as directed for life-threatening allergic reaction.   baclofen 10 MG tablet Commonly known as:  LIORESAL Take 10 mg by mouth 3 (three) times daily as needed.   budesonide-formoterol 80-4.5 MCG/ACT inhaler Commonly known as:  SYMBICORT Inhale 1-2 puffs into the lungs daily.   cetirizine 10 MG tablet Commonly known as:  ZYRTEC Take 1 tablet (10 mg total) by mouth 2 (two) times daily.   cromolyn 4 % ophthalmic solution Commonly known as:  OPTICROM Place 1 drop into both eyes 4 (four) times daily.   cycloSPORINE 0.05 % ophthalmic emulsion Commonly known as:  RESTASIS Place 1 drop into both eyes 2 (two) times daily.   EPIDUO 0.1-2.5 % gel Generic drug:  Adapalene-Benzoyl Peroxide AFFECTED AREA(S) ON FACE AND CHEST EVERY NIGHT AT BEDTIME   fluticasone 50 MCG/ACT nasal spray Commonly known as:  FLONASE 1-2 sprays in each nostril 3 times weekly as needed.   furosemide 20 MG tablet Commonly known as:  LASIX TAKE ONE TABLET BY MOUTH AS NEEDED for fluid   HYDROcodone-acetaminophen 7.5-325 MG tablet Commonly known as:  NORCO Take 1 tablet by mouth 3 (three) times daily as needed for moderate pain.   IFEREX 150 150 MG capsule Generic drug:  iron polysaccharides TAKE ONE CAPSULE BY MOUTH EVERY DAY   IRON PO Take 1 tablet by mouth daily as needed (for low  hemoglobin levels). Take when hemoglobin is low   LINZESS 145 MCG Caps capsule Generic drug:  linaclotide Take 145 mcg by mouth 2 (two) times daily.   losartan 25 MG tablet Commonly known as:  COZAAR Take 1 tablet (25 mg total) by mouth 2 (two) times daily.   metFORMIN 500 MG tablet Commonly known as:  GLUCOPHAGE TAKE 1 TABLET BY MOUTH EVERY DAY WITH BREAKFAST   montelukast 10 MG tablet Commonly known as:  SINGULAIR TAKE ONE TABLET BY MOUTH AT BEDTIME   norethindrone 0.35 MG tablet Commonly known as:  MICRONOR,CAMILA,ERRIN Take 1 tablet by mouth daily.   Olopatadine HCl 0.6 % Soln USE TWO SPRAYS in BOTH nostrils TWICE DAILY   pantoprazole 40 MG tablet Commonly known as:  PROTONIX Take 1 tablet (40 mg total) by mouth daily.   potassium chloride SA 20 MEQ tablet Commonly known as:  K-DUR,KLOR-CON TAKE TWO TABLETS BY MOUTH DAILY FOR 3 DAYS, THEN TAKE 1 TABLET BY MOUTH DAILY WHILE TAKING LASIX   pravastatin 20 MG tablet Commonly known as:  PRAVACHOL Take 1 tablet (20 mg total) by mouth daily.   SAXENDA 18 MG/3ML Sopn Generic drug:  Liraglutide -Weight Management Inject 0.6 mg into the skin daily.   SM COMPLETE ADVANCED FORMULA Tabs Take by mouth.   VANIQA 13.9 % cream Generic drug:  Eflornithine HCl APPLY TO AFFECTED AREA TWICE DAILY   VOLTAREN 1 % Gel Generic drug:  diclofenac sodium APPLY TWO GRAMS TOPICALLY FOUR TIMES DAILY       Past Medical History:  Diagnosis Date  . Anemia   . Anemia   . Anxiety   . Arthritis   . Asthma   . Bilateral calcaneal spurs   . Bilateral polycystic ovarian syndrome   . Bulging lumbar disc   . Carpal tunnel syndrome, bilateral   . Conjunctivitis   . COPD (chronic obstructive pulmonary disease) (HCC)    BRONCHITIS  . Diabetes mellitus without complication ([place])    TYPE 2   DX  4-5 YRS AGO  . ETD (eustachian tube dysfunction)   . ETD (Eustachian tube dysfunction), bilateral    left worse than right  . Fibroids     uterine  . Fibromyalgia   . GAD (generalized anxiety disorder)   . GERD (gastroesophageal reflux disease)    TAKES PRESCRIPTION MEDS  . Glaucoma    BOTH EYES  . Hematoma    Hematoma of Left Hand- had to wear a split  . Hyperlipidemia   . Hypertension   . Hypothyroidism    NODULES ON THYROID  . PONV (postoperative nausea and vomiting)   . Pulmonary embolism ([place])   . Tachycardia   . TMJ (dislocation of temporomandibular joint)   . Trigger finger   . Trigger finger of left thumb     Past Surgical History:  Procedure Laterality Date  . ANTERIOR CERVICAL DECOMP/DISCECTOMY FUSION N/A 11/13/2016   Procedure: Cervical five-six, Cervical six-seven Anterior Cervical Discectomy and Fusion, Allograft, Plate;  Surgeon: Marybelle Killings, MD;  Location: [facility];  Service: Orthopedics;  Laterality: N/A;  . BREAST SURGERY    . CARPAL TUNNEL RELEASE    . DILATION AND CURETTAGE OF UTERUS    . EXPLORATORY LAPAROTOMY    . LIPOMA EXCISION Left 06/15/2018   Procedure: LEFT CALF LIPOMA EXCISION;  Surgeon: Marybelle Killings, MD;  Location: [facility];  Service: Orthopedics;  Laterality: Left;  . NASAL SINUS SURGERY  07/22/13   Dr. Redmond Pulling in [place]  . TRIGGER FINGER RELEASE Right 06/15/2018   Procedure: RIGHT TRIGGER THUMB RELEASE;  Surgeon: Marybelle Killings, MD;  Location: [facility];  Service: Orthopedics;  Laterality: Right;    Review of systems negative except as noted in HPI / PMHx or noted below:  Review of Systems  Constitutional: Negative.   HENT: Negative.   Eyes: Negative.   Respiratory: Negative.   Cardiovascular: Negative.   Gastrointestinal: Negative.   Genitourinary: Negative.   Musculoskeletal: Negative.   Skin: Negative.   Neurological: Negative.   Endo/Heme/Allergies: Negative.   Psychiatric/Behavioral: Negative.      Objective:   Vitals:   10/27/18 1457  BP: 108/68  Pulse: 88  Resp: 18  SpO2: 99%          Physical Exam  HENT:  Head:  Normocephalic.  Right Ear: Tympanic membrane, external ear and ear canal normal.  Left Ear: Tympanic membrane, external ear and ear canal normal.  Nose: Nose normal. No mucosal edema or rhinorrhea.  Mouth/Throat: Uvula is midline, oropharynx is clear and moist and mucous membranes are normal. No oropharyngeal exudate.  Eyes: Conjunctivae are normal.  Neck: Trachea normal. No tracheal tenderness present. No tracheal deviation present. No thyromegaly present.  Cardiovascular: Normal rate, regular rhythm, S1 normal, S2 normal and normal heart sounds.  No murmur heard. Pulmonary/Chest: Breath sounds normal. No stridor. No respiratory distress. She has no wheezes. She has no rales.  Musculoskeletal: She exhibits no edema.  Lymphadenopathy:       Head (right side): No tonsillar adenopathy present.       Head (left side): No tonsillar adenopathy present.    She has no cervical adenopathy.  Neurological: She is alert.  Skin: No rash noted. She is not diaphoretic. No erythema. Nails show no clubbing.    Diagnostics:    Spirometry was performed and demonstrated an FEV1 of 1.96 at 91 % of predicted.  The patient had an Asthma Control Test with the following results: ACT Total Score: 22.    Assessment and Plan:   1. Asthma, severe persistent, well-controlled   2. Perennial allergic rhinitis   3. Seasonal allergic conjunctivitis   4. Vernal conjunctivitis of both eyes   5. LPRD (laryngopharyngeal reflux disease)   6. Asthma, moderate persistent, well-controlled   7. Gastroesophageal reflux disease without esophagitis     1. Continue Immunotherapy and Epi-Pen   2. Continue to Treat and prevent inflammation:   A.  Flonase- 1 spray each nostril 3 times a week    B.  montelukast 10 mg - one tablet one time per day  C.  Nucala injections every 4 weeks  D.  DISCONTINUE SYMBICORT  3. Continue to Treat and prevent reflux:   A. pantoprazole 40 mg in a.m.  4. If needed:   A. Patanase 2  sprays each  nostril two times per day  B. OTC antihistamine - Zyrtec 10mg  1-2 times per day  C. Proventil HFA or similar 2 inhalations every 4-6 hours  5. Continue eye therapy with Dr. Delman Cheadle.  6. Return in 6 months or earlier if problem  [place] appears to be doing quite well and were going to see if we can further consolidate her medical therapy by discontinuing her very low-dose Symbicort while she continues on mepolizumab injections and other anti-inflammatory agents for her airway.  She will also continue to treat reflux as noted above.  I will see her back in this clinic in 6 months or earlier if there is a problem.  Allena Katz, MD Allergy / Immunology [place]

## 2018-10-28 ENCOUNTER — Encounter: Payer: Self-pay | Admitting: Allergy and Immunology

## 2018-10-30 ENCOUNTER — Ambulatory Visit: Payer: Medicaid Other | Admitting: Obstetrics & Gynecology

## 2018-11-03 ENCOUNTER — Telehealth: Payer: Self-pay | Admitting: *Deleted

## 2018-11-03 ENCOUNTER — Ambulatory Visit (INDEPENDENT_AMBULATORY_CARE_PROVIDER_SITE_OTHER): Payer: Medicaid Other | Admitting: *Deleted

## 2018-11-03 ENCOUNTER — Ambulatory Visit: Payer: Medicaid Other | Admitting: Allergy and Immunology

## 2018-11-03 DIAGNOSIS — J309 Allergic rhinitis, unspecified: Secondary | ICD-10-CM | POA: Diagnosis not present

## 2018-11-03 MED ORDER — ALBUTEROL SULFATE HFA 108 (90 BASE) MCG/ACT IN AERS: 2.0000 | INHALATION_SPRAY | Freq: Four times a day (QID) | RESPIRATORY_TRACT | 3 refills | Status: DC | PRN

## 2018-11-03 NOTE — Telephone Encounter (Signed)
Sent in

## 2018-11-03 NOTE — Telephone Encounter (Signed)
Fax from [facility] AER 1-2 puffs Q4 hrs prn wheezing or SOB Med not on current med list Please advise

## 2018-11-06 ENCOUNTER — Ambulatory Visit: Payer: Medicaid Other | Admitting: Obstetrics & Gynecology

## 2018-11-07 ENCOUNTER — Other Ambulatory Visit: Payer: Self-pay | Admitting: Allergy and Immunology

## 2018-11-13 ENCOUNTER — Ambulatory Visit (INDEPENDENT_AMBULATORY_CARE_PROVIDER_SITE_OTHER): Payer: Medicaid Other

## 2018-11-13 ENCOUNTER — Ambulatory Visit: Payer: Medicaid Other

## 2018-11-13 ENCOUNTER — Telehealth: Payer: Self-pay | Admitting: *Deleted

## 2018-11-13 DIAGNOSIS — J309 Allergic rhinitis, unspecified: Secondary | ICD-10-CM | POA: Diagnosis not present

## 2018-11-13 NOTE — Telephone Encounter (Signed)
Patient came in to get Nucala shot states she is having issues with breathing and will restart her Symbicort as needed. FYI Dr Neldon Mc

## 2018-11-17 ENCOUNTER — Ambulatory Visit (INDEPENDENT_AMBULATORY_CARE_PROVIDER_SITE_OTHER): Payer: Medicaid Other | Admitting: *Deleted

## 2018-11-17 DIAGNOSIS — J4550 Severe persistent asthma, uncomplicated: Secondary | ICD-10-CM | POA: Diagnosis not present

## 2018-11-18 ENCOUNTER — Other Ambulatory Visit: Payer: Self-pay | Admitting: Allergy and Immunology

## 2018-11-18 ENCOUNTER — Other Ambulatory Visit: Payer: Self-pay | Admitting: Pediatrics

## 2018-11-18 DIAGNOSIS — F411 Generalized anxiety disorder: Secondary | ICD-10-CM

## 2018-11-20 ENCOUNTER — Telehealth: Payer: Self-pay | Admitting: *Deleted

## 2018-11-20 MED ORDER — BUDESONIDE-FORMOTEROL FUMARATE 80-4.5 MCG/ACT IN AERO: 1.0000 | INHALATION_SPRAY | Freq: Every day | RESPIRATORY_TRACT | 5 refills | Status: DC

## 2018-11-20 NOTE — Telephone Encounter (Signed)
Per last telephone note on 11/13/2018 per Dr. Neldon Mc. Symbicort sent to pharmacy. Patient is also requesting a refill on Mupirocin. Dr. Neldon Mc Please advise? Marland Kitchen

## 2018-11-20 NOTE — Telephone Encounter (Signed)
Patient called requesting refills on Symbicort and mupirocine. Please call patient at [phone]  Patient states pharmacy sent a refill request over

## 2018-11-23 ENCOUNTER — Other Ambulatory Visit: Payer: Self-pay | Admitting: *Deleted

## 2018-11-23 MED ORDER — MUPIROCIN CALCIUM 2 % EX CREA: 1.0000 "application " | TOPICAL_CREAM | Freq: Two times a day (BID) | CUTANEOUS | 0 refills | Status: DC

## 2018-11-23 MED ORDER — MUPIROCIN 2 % EX OINT: 1.0000 "application " | TOPICAL_OINTMENT | Freq: Two times a day (BID) | CUTANEOUS | 0 refills | Status: DC

## 2018-11-23 NOTE — Telephone Encounter (Signed)
Can refill mupirocin prescription.

## 2018-11-23 NOTE — Telephone Encounter (Signed)
Refilled mupirocin to patient pharmacy. Patient was advised and said thank you for the refills

## 2018-11-27 ENCOUNTER — Ambulatory Visit (INDEPENDENT_AMBULATORY_CARE_PROVIDER_SITE_OTHER): Payer: Medicaid Other | Admitting: *Deleted

## 2018-11-27 DIAGNOSIS — J309 Allergic rhinitis, unspecified: Secondary | ICD-10-CM

## 2018-12-01 ENCOUNTER — Other Ambulatory Visit: Payer: Self-pay | Admitting: Pediatrics

## 2018-12-01 ENCOUNTER — Ambulatory Visit (INDEPENDENT_AMBULATORY_CARE_PROVIDER_SITE_OTHER): Payer: Medicaid Other

## 2018-12-01 DIAGNOSIS — J309 Allergic rhinitis, unspecified: Secondary | ICD-10-CM

## 2018-12-02 ENCOUNTER — Other Ambulatory Visit: Payer: Self-pay | Admitting: Pediatrics

## 2018-12-02 DIAGNOSIS — R609 Edema, unspecified: Secondary | ICD-10-CM

## 2018-12-08 ENCOUNTER — Other Ambulatory Visit: Payer: Self-pay | Admitting: Pediatrics

## 2018-12-08 DIAGNOSIS — F411 Generalized anxiety disorder: Secondary | ICD-10-CM

## 2018-12-08 NOTE — Telephone Encounter (Signed)
Last seen 09/30/18  Dr Evette Doffing

## 2018-12-11 ENCOUNTER — Ambulatory Visit (INDEPENDENT_AMBULATORY_CARE_PROVIDER_SITE_OTHER): Payer: Medicaid Other

## 2018-12-11 DIAGNOSIS — J309 Allergic rhinitis, unspecified: Secondary | ICD-10-CM

## 2018-12-16 ENCOUNTER — Ambulatory Visit (INDEPENDENT_AMBULATORY_CARE_PROVIDER_SITE_OTHER): Payer: Medicaid Other

## 2018-12-16 DIAGNOSIS — J4550 Severe persistent asthma, uncomplicated: Secondary | ICD-10-CM

## 2018-12-17 ENCOUNTER — Telehealth: Payer: Self-pay

## 2018-12-17 NOTE — Telephone Encounter (Signed)
x

## 2018-12-19 DIAGNOSIS — R531 Weakness: Secondary | ICD-10-CM | POA: Diagnosis not present

## 2018-12-22 NOTE — Progress Notes (Signed)
Exp 12/24/19

## 2018-12-23 DIAGNOSIS — J301 Allergic rhinitis due to pollen: Secondary | ICD-10-CM | POA: Diagnosis not present

## 2018-12-24 DIAGNOSIS — K649 Unspecified hemorrhoids: Secondary | ICD-10-CM | POA: Diagnosis not present

## 2018-12-24 DIAGNOSIS — Z6826 Body mass index (BMI) 26.0-26.9, adult: Secondary | ICD-10-CM | POA: Diagnosis not present

## 2018-12-25 ENCOUNTER — Ambulatory Visit (INDEPENDENT_AMBULATORY_CARE_PROVIDER_SITE_OTHER): Payer: BLUE CROSS/BLUE SHIELD

## 2018-12-25 DIAGNOSIS — J309 Allergic rhinitis, unspecified: Secondary | ICD-10-CM

## 2018-12-26 DIAGNOSIS — R635 Abnormal weight gain: Secondary | ICD-10-CM | POA: Diagnosis not present

## 2018-12-31 ENCOUNTER — Other Ambulatory Visit: Payer: Self-pay | Admitting: Allergy and Immunology

## 2019-01-06 ENCOUNTER — Ambulatory Visit: Payer: Medicaid Other | Admitting: Pediatrics

## 2019-01-06 ENCOUNTER — Ambulatory Visit (INDEPENDENT_AMBULATORY_CARE_PROVIDER_SITE_OTHER): Payer: BLUE CROSS/BLUE SHIELD | Admitting: Physician Assistant

## 2019-01-06 ENCOUNTER — Telehealth: Payer: Self-pay

## 2019-01-06 ENCOUNTER — Encounter: Payer: Self-pay | Admitting: Physician Assistant

## 2019-01-06 VITALS — BP 123/77 | HR 89 | Temp 98.2°F | Ht 62.0 in | Wt 140.8 lb

## 2019-01-06 DIAGNOSIS — R609 Edema, unspecified: Secondary | ICD-10-CM

## 2019-01-06 DIAGNOSIS — M797 Fibromyalgia: Secondary | ICD-10-CM | POA: Diagnosis not present

## 2019-01-06 DIAGNOSIS — Z0000 Encounter for general adult medical examination without abnormal findings: Secondary | ICD-10-CM

## 2019-01-06 DIAGNOSIS — F411 Generalized anxiety disorder: Secondary | ICD-10-CM

## 2019-01-06 DIAGNOSIS — L680 Hirsutism: Secondary | ICD-10-CM

## 2019-01-06 DIAGNOSIS — I10 Essential (primary) hypertension: Secondary | ICD-10-CM | POA: Diagnosis not present

## 2019-01-06 DIAGNOSIS — D500 Iron deficiency anemia secondary to blood loss (chronic): Secondary | ICD-10-CM | POA: Diagnosis not present

## 2019-01-06 DIAGNOSIS — E785 Hyperlipidemia, unspecified: Secondary | ICD-10-CM

## 2019-01-06 DIAGNOSIS — L700 Acne vulgaris: Secondary | ICD-10-CM

## 2019-01-06 LAB — BAYER DCA HB A1C WAIVED: HB A1C (BAYER DCA - WAIVED): 5 % (ref <7.0)

## 2019-01-06 MED ORDER — PANTOPRAZOLE SODIUM 40 MG PO TBEC: 40.0000 mg | DELAYED_RELEASE_TABLET | Freq: Every day | ORAL | 1 refills | Status: DC

## 2019-01-06 MED ORDER — EPIDUO 0.1-2.5 % EX GEL: CUTANEOUS | 5 refills | Status: DC

## 2019-01-06 MED ORDER — VANIQA 13.9 % EX CREA: TOPICAL_CREAM | CUTANEOUS | 5 refills | Status: DC

## 2019-01-06 MED ORDER — ALPRAZOLAM 0.5 MG PO TABS: ORAL_TABLET | ORAL | 5 refills | Status: DC

## 2019-01-06 MED ORDER — PRAVASTATIN SODIUM 20 MG PO TABS: 20.0000 mg | ORAL_TABLET | Freq: Every day | ORAL | 1 refills | Status: DC

## 2019-01-06 MED ORDER — FUROSEMIDE 20 MG PO TABS: ORAL_TABLET | ORAL | 3 refills | Status: DC

## 2019-01-06 MED ORDER — LOSARTAN POTASSIUM 25 MG PO TABS: 25.0000 mg | ORAL_TABLET | Freq: Two times a day (BID) | ORAL | 3 refills | Status: DC

## 2019-01-06 NOTE — Telephone Encounter (Signed)
Cystic acne

## 2019-01-06 NOTE — Telephone Encounter (Signed)
Getting prior auth for Epiduo  WHat is DX for this?

## 2019-01-07 ENCOUNTER — Telehealth: Payer: Self-pay | Admitting: Family Medicine

## 2019-01-07 LAB — CMP14+EGFR
ALT: 10 IU/L (ref 0–32)
AST: 18 IU/L (ref 0–40)
Albumin/Globulin Ratio: 1.8 (ref 1.2–2.2)
Albumin: 4.4 g/dL (ref 3.8–4.8)
Alkaline Phosphatase: 84 IU/L (ref 39–117)
BUN/Creatinine Ratio: 11 (ref 9–23)
BUN: 7 mg/dL (ref 6–24)
Bilirubin Total: 0.3 mg/dL (ref 0.0–1.2)
CO2: 22 mmol/L (ref 20–29)
Calcium: 9 mg/dL (ref 8.7–10.2)
Chloride: 105 mmol/L (ref 96–106)
Creatinine, Ser: 0.66 mg/dL (ref 0.57–1.00)
GFR calc Af Amer: 119 mL/min/{1.73_m2} (ref >59)
GFR calc non Af Amer: 103 mL/min/{1.73_m2} (ref >59)
Globulin, Total: 2.4 g/dL (ref 1.5–4.5)
Glucose: 86 mg/dL (ref 65–99)
Potassium: 3.4 mmol/L — ABNORMAL LOW (ref 3.5–5.2)
Sodium: 144 mmol/L (ref 134–144)
Total Protein: 6.8 g/dL (ref 6.0–8.5)

## 2019-01-07 LAB — CBC WITH DIFFERENTIAL/PLATELET
Basophils Absolute: 0 10*3/uL (ref 0.0–0.2)
Basos: 0 %
EOS (ABSOLUTE): 0 10*3/uL (ref 0.0–0.4)
Eos: 0 %
Hematocrit: 26.9 % — ABNORMAL LOW (ref 34.0–46.6)
Hemoglobin: 8.3 g/dL — CL (ref 11.1–15.9)
Immature Grans (Abs): 0 10*3/uL (ref 0.0–0.1)
Immature Granulocytes: 0 %
Lymphocytes Absolute: 1.4 10*3/uL (ref 0.7–3.1)
Lymphs: 31 %
MCH: 21.6 pg — ABNORMAL LOW (ref 26.6–33.0)
MCHC: 30.9 g/dL — ABNORMAL LOW (ref 31.5–35.7)
MCV: 70 fL — ABNORMAL LOW (ref 79–97)
Monocytes Absolute: 0.5 10*3/uL (ref 0.1–0.9)
Monocytes: 10 %
Neutrophils Absolute: 2.7 10*3/uL (ref 1.4–7.0)
Neutrophils: 59 %
Platelets: 305 10*3/uL (ref 150–450)
RBC: 3.85 x10E6/uL (ref 3.77–5.28)
RDW: 17.9 % — ABNORMAL HIGH (ref 11.7–15.4)
WBC: 4.6 10*3/uL (ref 3.4–10.8)

## 2019-01-07 LAB — LIPID PANEL
Chol/HDL Ratio: 2.9 ratio (ref 0.0–4.4)
Cholesterol, Total: 154 mg/dL (ref 100–199)
HDL: 54 mg/dL (ref >39)
LDL Calculated: 88 mg/dL (ref 0–99)
Triglycerides: 62 mg/dL (ref 0–149)
VLDL Cholesterol Cal: 12 mg/dL (ref 5–40)

## 2019-01-07 LAB — TSH: TSH: 0.504 u[IU]/mL (ref 0.450–4.500)

## 2019-01-07 NOTE — Progress Notes (Signed)
BP 123/77   Pulse 89   Temp 98.2 F (36.8 C) (Oral)   Ht _0  (1.575 m)   Wt 140 lb 12.8 oz (63.9 kg)   BMI 25.75 kg/m    Subjective:    Patient ID: Jennifer Mullins, female    DOB: 07-Jul-1971, 48 y.o.   MRN: [id number]  HPI: Jennifer Mullins is a 48 y.o. female presenting on 01/06/2019 for Hypertension (3 month ) and Anxiety  This patient comes in for periodic recheck on medications and conditions including hypertension, iron deficiency for blood loss (GYN), hyperlipid, acne, hirsutism.  She also has GAD, fibromyalgia. She goes to [facility] for this. She comes to our office for anxiety treatment.  .This patient returns for a 6 month recheck on narcotic use for GAD and medication refills  Patient currently taking alprazolam. Behavior- normal Medication side effects- no Any concerns- no  PMP AWARE website reviewed: Yes Any suspicious activity on PMP Aware: No    All medications are reviewed today. There are no reports of any problems with the medications. All of the medical conditions are reviewed and updated.  Lab work is reviewed and will be ordered as medically necessary. There are no new problems reported with today's visit. This patient returns for a 6 month recheck on narcotic use for GAD and medication refills  Patient currently taking alprazolam. Behavior- normal Medication side effects- no Any concerns- no  PMP AWARE website reviewed: Yes Any suspicious activity on PMP Aware: No    Past Medical History:  Diagnosis Date  . Anemia   . Anemia   . Anxiety   . Arthritis   . Asthma   . Bilateral calcaneal spurs   . Bilateral polycystic ovarian syndrome   . Bulging lumbar disc   . Carpal tunnel syndrome, bilateral   . Conjunctivitis   . COPD (chronic obstructive pulmonary disease) (HCC)    BRONCHITIS  . Diabetes mellitus without complication ([place])    TYPE 2   DX  4-5 YRS AGO  . ETD (eustachian tube dysfunction)   . ETD (Eustachian tube dysfunction),  bilateral    left worse than right  . Fibroids    uterine  . Fibromyalgia   . GAD (generalized anxiety disorder)   . GERD (gastroesophageal reflux disease)    TAKES PRESCRIPTION MEDS  . Glaucoma    BOTH EYES  . Hematoma    Hematoma of Left Hand- had to wear a split  . Hyperlipidemia   . Hypertension   . Hypothyroidism    NODULES ON THYROID  . PONV (postoperative nausea and vomiting)   . Pulmonary embolism ([place])   . Tachycardia   . TMJ (dislocation of temporomandibular joint)   . Trigger finger   . Trigger finger of left thumb    Relevant past medical, surgical, family and social history reviewed and updated as indicated. Interim medical history since our last visit reviewed. Allergies and medications reviewed and updated. DATA REVIEWED: CHART IN EPIC  Family History reviewed for pertinent findings.  Review of Systems  Constitutional: Positive for fatigue. Negative for activity change and fever.  HENT: Negative.   Eyes: Negative.   Respiratory: Negative.  Negative for cough.   Cardiovascular: Negative.  Negative for chest pain.  Gastrointestinal: Negative.  Negative for abdominal pain.  Endocrine: Negative.   Genitourinary: Negative.  Negative for dysuria.  Musculoskeletal: Positive for arthralgias, back pain and myalgias.  Skin: Negative.   Neurological: Negative.   Psychiatric/Behavioral: The patient  is nervous/anxious.     Allergies as of 01/06/2019      Reactions   Demerol [meperidine] Shortness Of Breath   FLUSHING AND SHORTNESS OF BREATH   Morphine And Related Shortness Of Breath   Flushed and hot hyper Flushed and hot hyper Flushed and hot hyper Flushed and hot hyper   Other Shortness Of Breath   Flushed and hot hyper Flushed and hot hyper Flushed and hot hyper Flushed and hot hyper Flushed and hot hyper   Cardizem  [diltiazem Hcl] Other (See Comments)   Diltiazem Hcl Hives   Lisinopril    Caused cough   Lyrica [pregabalin]    Chest pain, elevated  heart rate   Tramadol Other (See Comments)   Tears up stomach      Medication List       Accurate as of January 06, 2019 11:59 PM. Always use your most recent med list.        albuterol (2.5 MG/3ML) 0.083% nebulizer solution Commonly known as:  PROVENTIL Take 3 mLs (2.5 mg total) by nebulization every 6 (six) hours as needed for wheezing or shortness of breath.   albuterol 108 (90 Base) MCG/ACT inhaler Commonly known as:  PROVENTIL HFA;VENTOLIN HFA Inhale 2 puffs into the lungs every 6 (six) hours as needed for wheezing or shortness of breath.   ALPRAZolam 0.5 MG tablet Commonly known as:  XANAX TAKE 1 TABLET BY MOUTH TWICE DAILY AS NEEDED. MAY TAKE ONE MORE TABLET BEFORE FOR ALLERGY SHOTS ONCE A WEEK (30 DAY supply)   aspirin 81 MG chewable tablet Chew 81 mg by mouth daily.   AUVI-Q 0.3 mg/0.3 mL Soaj injection Generic drug:  EPINEPHrine Use as directed for life-threatening allergic reaction.   baclofen 10 MG tablet Commonly known as:  LIORESAL Take 10 mg by mouth 3 (three) times daily as needed.   budesonide-formoterol 80-4.5 MCG/ACT inhaler Commonly known as:  SYMBICORT Inhale 1-2 puffs into the lungs daily.   cetirizine 10 MG tablet Commonly known as:  ZYRTEC Take 1 tablet (10 mg total) by mouth 2 (two) times daily.   cromolyn 4 % ophthalmic solution Commonly known as:  OPTICROM Place 1 drop into both eyes 4 (four) times daily.   cycloSPORINE 0.05 % ophthalmic emulsion Commonly known as:  RESTASIS Place 1 drop into both eyes 2 (two) times daily.   EPIDUO 0.1-2.5 % gel Generic drug:  Adapalene-Benzoyl Peroxide AFFECTED AREA(S) ON FACE AND CHEST EVERY NIGHT AT BEDTIME   fluticasone 50 MCG/ACT nasal spray Commonly known as:  FLONASE 1-2 sprays in each nostril 3 times weekly as needed.   furosemide 20 MG tablet Commonly known as:  LASIX TAKE 1 TABLET BY MOUTH DAILY AS NEEDED FOR FLUID   HYDROcodone-acetaminophen 7.5-325 MG tablet Commonly known as:   NORCO Take 1 tablet by mouth 3 (three) times daily as needed for moderate pain.   IFEREX 150 150 MG capsule Generic drug:  iron polysaccharides TAKE ONE CAPSULE BY MOUTH EVERY DAY   IRON PO Take 1 tablet by mouth daily as needed (for low hemoglobin levels). Take when hemoglobin is low   LINZESS 145 MCG Caps capsule Generic drug:  linaclotide Take 145 mcg by mouth 2 (two) times daily.   losartan 25 MG tablet Commonly known as:  COZAAR Take 1 tablet (25 mg total) by mouth 2 (two) times daily.   metFORMIN 500 MG tablet Commonly known as:  GLUCOPHAGE TAKE 1 TABLET BY MOUTH EVERY DAY WITH BREAKFAST   montelukast 10  MG tablet Commonly known as:  SINGULAIR Take 1 tablet (10 mg total) by mouth at bedtime.   Olopatadine HCl 0.6 % Soln USE TWO SPRAYS IN BOTH NOSTRILS TWICE DAILY   pantoprazole 40 MG tablet Commonly known as:  PROTONIX Take 1 tablet (40 mg total) by mouth daily.   potassium chloride SA 20 MEQ tablet Commonly known as:  K-DUR,KLOR-CON TAKE 1 TABLET BY MOUTH DAILY WHILE TAKING LASIX   pravastatin 20 MG tablet Commonly known as:  PRAVACHOL Take 1 tablet (20 mg total) by mouth daily.   SM COMPLETE ADVANCED FORMULA Tabs Take by mouth.   VANIQA 13.9 % cream Generic drug:  Eflornithine HCl APPLY TO AFFECTED AREA TWICE DAILY   VOLTAREN 1 % Gel Generic drug:  diclofenac sodium APPLY TWO GRAMS TOPICALLY FOUR TIMES DAILY          Objective:    BP 123/77   Pulse 89   Temp 98.2 F (36.8 C) (Oral)   Ht _0  (1.575 m)   Wt 140 lb 12.8 oz (63.9 kg)   BMI 25.75 kg/m   Allergies  Allergen Reactions  . Demerol [Meperidine] Shortness Of Breath    FLUSHING AND SHORTNESS OF BREATH  . Morphine And Related Shortness Of Breath    Flushed and hot hyper Flushed and hot hyper Flushed and hot hyper Flushed and hot hyper  . Other Shortness Of Breath    Flushed and hot hyper Flushed and hot hyper Flushed and hot hyper Flushed and hot hyper Flushed and hot hyper   . Cardizem  [Diltiazem Hcl] Other (See Comments)  . Diltiazem Hcl Hives  . Lisinopril     Caused cough  . Lyrica [Pregabalin]     Chest pain, elevated heart rate  . Tramadol Other (See Comments)    Tears up stomach    Wt Readings from Last 3 Encounters:  01/06/19 140 lb 12.8 oz (63.9 kg)  09/30/18 147 lb (66.7 kg)  09/09/18 150 lb (68 kg)    Physical Exam Constitutional:      Appearance: She is well-developed.  HENT:     Head: Normocephalic and atraumatic.  Eyes:     Conjunctiva/sclera: Conjunctivae normal.     Pupils: Pupils are equal, round, and reactive to light.  Cardiovascular:     Rate and Rhythm: Normal rate and regular rhythm.     Heart sounds: Normal heart sounds.  Pulmonary:     Effort: Pulmonary effort is normal.     Breath sounds: Normal breath sounds.  Abdominal:     General: Bowel sounds are normal.     Palpations: Abdomen is soft.  Skin:    General: Skin is warm and dry.     Findings: No rash.  Neurological:     Mental Status: She is alert and oriented to person, place, and time.     Deep Tendon Reflexes: Reflexes are normal and symmetric.  Psychiatric:        Behavior: Behavior normal.        Thought Content: Thought content normal.        Judgment: Judgment normal.     Results for orders placed or performed in visit on 01/06/19  CBC with Differential/Platelet  Result Value Ref Range   WBC 4.6 3.4 - 10.8 x10E3/uL   RBC 3.85 3.77 - 5.28 x10E6/uL   Hemoglobin 8.3 (LL) 11.1 - 15.9 g/dL   Hematocrit 26.9 (L) 34.0 - 46.6 %   MCV 70 (L) 79 - 97 fL  MCH 21.6 (L) 26.6 - 33.0 pg   MCHC 30.9 (L) 31.5 - 35.7 g/dL   RDW 17.9 (H) 11.7 - 15.4 %   Platelets 305 150 - 450 x10E3/uL   Neutrophils 59 Not Estab. %   Lymphs 31 Not Estab. %   Monocytes 10 Not Estab. %   Eos 0 Not Estab. %   Basos 0 Not Estab. %   Neutrophils Absolute 2.7 1.4 - 7.0 x10E3/uL   Lymphocytes Absolute 1.4 0.7 - 3.1 x10E3/uL   Monocytes Absolute 0.5 0.1 - 0.9 x10E3/uL   EOS  (ABSOLUTE) 0.0 0.0 - 0.4 x10E3/uL   Basophils Absolute 0.0 0.0 - 0.2 x10E3/uL   Immature Granulocytes 0 Not Estab. %   Immature Grans (Abs) 0.0 0.0 - 0.1 x10E3/uL  CMP14+EGFR  Result Value Ref Range   Glucose 86 65 - 99 mg/dL   BUN 7 6 - 24 mg/dL   Creatinine, Ser 0.66 0.57 - 1.00 mg/dL   GFR calc non Af Amer 103 >59 mL/min/1.73   GFR calc Af Amer 119 >59 mL/min/1.73   BUN/Creatinine Ratio 11 9 - 23   Sodium 144 134 - 144 mmol/L   Potassium 3.4 (L) 3.5 - 5.2 mmol/L   Chloride 105 96 - 106 mmol/L   CO2 22 20 - 29 mmol/L   Calcium 9.0 8.7 - 10.2 mg/dL   Total Protein 6.8 6.0 - 8.5 g/dL   Albumin 4.4 3.8 - 4.8 g/dL   Globulin, Total 2.4 1.5 - 4.5 g/dL   Albumin/Globulin Ratio 1.8 1.2 - 2.2   Bilirubin Total 0.3 0.0 - 1.2 mg/dL   Alkaline Phosphatase 84 39 - 117 IU/L   AST 18 0 - 40 IU/L   ALT 10 0 - 32 IU/L  Lipid panel  Result Value Ref Range   Cholesterol, Total 154 100 - 199 mg/dL   Triglycerides 62 0 - 149 mg/dL   HDL 54 >39 mg/dL   VLDL Cholesterol Cal 12 5 - 40 mg/dL   LDL Calculated 88 0 - 99 mg/dL   Chol/HDL Ratio 2.9 0.0 - 4.4 ratio  TSH  Result Value Ref Range   TSH 0.504 0.450 - 4.500 uIU/mL  Bayer DCA Hb A1c Waived  Result Value Ref Range   HB A1C (BAYER DCA - WAIVED) 5.0 <7.0 %      Assessment & Plan:   1. GAD (generalized anxiety disorder) - ALPRAZolam (XANAX) 0.5 MG tablet; TAKE 1 TABLET BY MOUTH TWICE DAILY AS NEEDED. MAY TAKE ONE MORE TABLET BEFORE FOR ALLERGY SHOTS ONCE A WEEK (30 DAY supply)  Dispense: 65 tablet; Refill: 5  2. Essential hypertension - CBC with Differential/Platelet - CMP14+EGFR - TSH - Bayer DCA Hb A1c Waived - losartan (COZAAR) 25 MG tablet; Take 1 tablet (25 mg total) by mouth 2 (two) times daily.  Dispense: 180 tablet; Refill: 3  3. Fibromyalgia Continue treatment with [facility] in [place] for her chronic pain  4. Well adult exam - CBC with Differential/Platelet - CMP14+EGFR - Lipid panel - TSH - Bayer DCA Hb A1c  Waived  5. Iron deficiency anemia due to chronic blood loss - CBC with Differential/Platelet  6. Body fluid retention - furosemide (LASIX) 20 MG tablet; TAKE 1 TABLET BY MOUTH DAILY AS NEEDED FOR FLUID  Dispense: 90 tablet; Refill: 3  7. Hyperlipidemia, unspecified hyperlipidemia type - pravastatin (PRAVACHOL) 20 MG tablet; Take 1 tablet (20 mg total) by mouth daily.  Dispense: 90 tablet; Refill: 1  8. Acne vulgaris - EPIDUO  0.1-2.5 % gel; AFFECTED AREA(S) ON FACE AND CHEST EVERY NIGHT AT BEDTIME  Dispense: 45 g; Refill: 5  9. Hirsutism - VANIQA 13.9 % cream; APPLY TO AFFECTED AREA TWICE DAILY  Dispense: 45 g; Refill: 5   Continue all other maintenance medications as listed above.  Follow up plan: No follow-ups on file.  Educational handout given for [place] PA-C [facility] [address]  [place], [place] [address] [phone]   01/07/2019, 8:41 PM

## 2019-01-07 NOTE — Telephone Encounter (Signed)
I am the coverage person for Jennifer Mullins today, her hemoglobin has taken a significant dip from 6 months ago to 8.3.  I want her to start taking ferrous sulfate 325 mg daily and I want her to come in and check a CBC again next week.  Please have her watch for any signs of bleeding and if she is having any rectal or stool bleeding then she may need to go to the emergency department.  Also if she is having any significant heavy vaginal bleeding she likely needs to be seen for that as well. Caryl Pina, MD [facility] 01/07/2019, 9:29 AM

## 2019-01-11 ENCOUNTER — Telehealth: Payer: Self-pay | Admitting: Physician Assistant

## 2019-01-11 NOTE — Telephone Encounter (Signed)
Patient aware per different encounter.  This encounter will now be closed

## 2019-01-11 NOTE — Telephone Encounter (Signed)
Patient has an appt on 01/12/2019 with Glenard Haring will discuss then

## 2019-01-11 NOTE — Telephone Encounter (Signed)
Aware of results and recommendations  

## 2019-01-11 NOTE — Telephone Encounter (Signed)
PT is wanting to talk to nurse about doing either an FOBT or Cologuard to see if there is blood in her stool

## 2019-01-12 ENCOUNTER — Other Ambulatory Visit: Payer: Self-pay

## 2019-01-12 ENCOUNTER — Observation Stay (HOSPITAL_COMMUNITY)
Admission: EM | Admit: 2019-01-12 | Discharge: 2019-01-14 | Disposition: A | Discharge status: Home / Self Care | Payer: BLUE CROSS/BLUE SHIELD | Attending: Internal Medicine | Admitting: Internal Medicine

## 2019-01-12 ENCOUNTER — Encounter: Payer: Self-pay | Admitting: Physician Assistant

## 2019-01-12 ENCOUNTER — Other Ambulatory Visit: Payer: Self-pay | Admitting: Physician Assistant

## 2019-01-12 ENCOUNTER — Telehealth: Payer: Self-pay | Admitting: Physician Assistant

## 2019-01-12 ENCOUNTER — Ambulatory Visit (INDEPENDENT_AMBULATORY_CARE_PROVIDER_SITE_OTHER): Payer: BLUE CROSS/BLUE SHIELD | Admitting: Physician Assistant

## 2019-01-12 ENCOUNTER — Encounter (HOSPITAL_COMMUNITY): Payer: Self-pay | Admitting: Emergency Medicine

## 2019-01-12 VITALS — BP 114/67 | HR 89 | Temp 98.9°F | Ht 62.0 in | Wt 140.6 lb

## 2019-01-12 DIAGNOSIS — Z7902 Long term (current) use of antithrombotics/antiplatelets: Secondary | ICD-10-CM | POA: Diagnosis not present

## 2019-01-12 DIAGNOSIS — E785 Hyperlipidemia, unspecified: Secondary | ICD-10-CM | POA: Diagnosis not present

## 2019-01-12 DIAGNOSIS — D591 Other autoimmune hemolytic anemias: Principal | ICD-10-CM | POA: Insufficient documentation

## 2019-01-12 DIAGNOSIS — D649 Anemia, unspecified: Secondary | ICD-10-CM | POA: Insufficient documentation

## 2019-01-12 DIAGNOSIS — D62 Acute posthemorrhagic anemia: Secondary | ICD-10-CM

## 2019-01-12 DIAGNOSIS — Z1211 Encounter for screening for malignant neoplasm of colon: Secondary | ICD-10-CM | POA: Diagnosis not present

## 2019-01-12 DIAGNOSIS — R0602 Shortness of breath: Secondary | ICD-10-CM | POA: Diagnosis not present

## 2019-01-12 DIAGNOSIS — N938 Other specified abnormal uterine and vaginal bleeding: Secondary | ICD-10-CM

## 2019-01-12 DIAGNOSIS — M797 Fibromyalgia: Secondary | ICD-10-CM | POA: Diagnosis not present

## 2019-01-12 DIAGNOSIS — Z87891 Personal history of nicotine dependence: Secondary | ICD-10-CM | POA: Insufficient documentation

## 2019-01-12 DIAGNOSIS — E876 Hypokalemia: Secondary | ICD-10-CM | POA: Diagnosis present

## 2019-01-12 DIAGNOSIS — Z7982 Long term (current) use of aspirin: Secondary | ICD-10-CM | POA: Diagnosis not present

## 2019-01-12 DIAGNOSIS — Z79899 Other long term (current) drug therapy: Secondary | ICD-10-CM | POA: Diagnosis not present

## 2019-01-12 DIAGNOSIS — E119 Type 2 diabetes mellitus without complications: Secondary | ICD-10-CM | POA: Diagnosis not present

## 2019-01-12 DIAGNOSIS — N920 Excessive and frequent menstruation with regular cycle: Secondary | ICD-10-CM | POA: Insufficient documentation

## 2019-01-12 DIAGNOSIS — J449 Chronic obstructive pulmonary disease, unspecified: Secondary | ICD-10-CM | POA: Diagnosis not present

## 2019-01-12 DIAGNOSIS — I10 Essential (primary) hypertension: Secondary | ICD-10-CM | POA: Diagnosis present

## 2019-01-12 DIAGNOSIS — D500 Iron deficiency anemia secondary to blood loss (chronic): Secondary | ICD-10-CM | POA: Diagnosis present

## 2019-01-12 DIAGNOSIS — R0789 Other chest pain: Secondary | ICD-10-CM | POA: Diagnosis not present

## 2019-01-12 DIAGNOSIS — E039 Hypothyroidism, unspecified: Secondary | ICD-10-CM | POA: Insufficient documentation

## 2019-01-12 DIAGNOSIS — N921 Excessive and frequent menstruation with irregular cycle: Secondary | ICD-10-CM | POA: Diagnosis present

## 2019-01-12 DIAGNOSIS — R7989 Other specified abnormal findings of blood chemistry: Secondary | ICD-10-CM | POA: Diagnosis not present

## 2019-01-12 LAB — HEMOGLOBIN, FINGERSTICK: Hemoglobin: 8.9 g/dL — ABNORMAL LOW (ref 11.1–15.9)

## 2019-01-12 MED ORDER — LINACLOTIDE 145 MCG PO CAPS: 145.0000 ug | ORAL_CAPSULE | Freq: Two times a day (BID) | ORAL | 11 refills | Status: DC

## 2019-01-12 MED ORDER — HYDROCODONE-ACETAMINOPHEN 5-325 MG PO TABS: 1.0000 | ORAL_TABLET | Freq: Three times a day (TID) | ORAL | 0 refills | Status: DC | PRN

## 2019-01-12 MED ORDER — POLYSACCHARIDE IRON COMPLEX 150 MG PO CAPS: 150.0000 mg | ORAL_CAPSULE | Freq: Two times a day (BID) | ORAL | 5 refills | Status: DC

## 2019-01-12 NOTE — Telephone Encounter (Signed)
Labs were faxed over this morning per pt request.

## 2019-01-12 NOTE — Patient Instructions (Signed)
Lab in 10 days

## 2019-01-12 NOTE — ED Triage Notes (Signed)
Pt brought in by [facility] for c/o weakness and sob; pt states she went to her PCP today and was told she is anemic with a hgb of 8; pt c/o some chest tightness; pt told ems she has been under a lot of stress and is anxious

## 2019-01-13 ENCOUNTER — Emergency Department (HOSPITAL_COMMUNITY): Payer: BLUE CROSS/BLUE SHIELD

## 2019-01-13 ENCOUNTER — Observation Stay (HOSPITAL_COMMUNITY): Payer: BLUE CROSS/BLUE SHIELD

## 2019-01-13 ENCOUNTER — Observation Stay (HOSPITAL_BASED_OUTPATIENT_CLINIC_OR_DEPARTMENT_OTHER): Payer: BLUE CROSS/BLUE SHIELD

## 2019-01-13 DIAGNOSIS — D62 Acute posthemorrhagic anemia: Secondary | ICD-10-CM

## 2019-01-13 DIAGNOSIS — R7989 Other specified abnormal findings of blood chemistry: Secondary | ICD-10-CM | POA: Diagnosis not present

## 2019-01-13 DIAGNOSIS — E782 Mixed hyperlipidemia: Secondary | ICD-10-CM | POA: Diagnosis not present

## 2019-01-13 DIAGNOSIS — R079 Chest pain, unspecified: Secondary | ICD-10-CM | POA: Diagnosis not present

## 2019-01-13 DIAGNOSIS — D500 Iron deficiency anemia secondary to blood loss (chronic): Secondary | ICD-10-CM

## 2019-01-13 DIAGNOSIS — D649 Anemia, unspecified: Secondary | ICD-10-CM | POA: Diagnosis not present

## 2019-01-13 DIAGNOSIS — N921 Excessive and frequent menstruation with irregular cycle: Secondary | ICD-10-CM

## 2019-01-13 DIAGNOSIS — E876 Hypokalemia: Secondary | ICD-10-CM | POA: Diagnosis not present

## 2019-01-13 DIAGNOSIS — I10 Essential (primary) hypertension: Secondary | ICD-10-CM

## 2019-01-13 DIAGNOSIS — R140 Abdominal distension (gaseous): Secondary | ICD-10-CM | POA: Diagnosis not present

## 2019-01-13 DIAGNOSIS — D259 Leiomyoma of uterus, unspecified: Secondary | ICD-10-CM | POA: Diagnosis not present

## 2019-01-13 LAB — ABO/RH: ABO/RH(D): O POS

## 2019-01-13 LAB — IRON AND TIBC
Iron: 18 ug/dL — ABNORMAL LOW (ref 28–170)
Saturation Ratios: 4 % — ABNORMAL LOW (ref 10.4–31.8)
TIBC: 446 ug/dL (ref 250–450)
UIBC: 428 ug/dL

## 2019-01-13 LAB — VITAMIN B12: Vitamin B-12: 671 pg/mL (ref 180–914)

## 2019-01-13 LAB — WET PREP, GENITAL
Clue Cells Wet Prep HPF POC: NONE SEEN
Sperm: NONE SEEN
Trich, Wet Prep: NONE SEEN
Yeast Wet Prep HPF POC: NONE SEEN

## 2019-01-13 LAB — PROTIME-INR
INR: 1 (ref 0.8–1.2)
Prothrombin Time: 13.4 seconds (ref 11.4–15.2)

## 2019-01-13 LAB — COMPREHENSIVE METABOLIC PANEL
ALT: 14 U/L (ref 0–44)
AST: 17 U/L (ref 15–41)
Albumin: 3.8 g/dL (ref 3.5–5.0)
Alkaline Phosphatase: 84 U/L (ref 38–126)
Anion gap: 9 (ref 5–15)
BUN: 8 mg/dL (ref 6–20)
CO2: 24 mmol/L (ref 22–32)
Calcium: 8.3 mg/dL — ABNORMAL LOW (ref 8.9–10.3)
Chloride: 104 mmol/L (ref 98–111)
Creatinine, Ser: 0.62 mg/dL (ref 0.44–1.00)
GFR calc Af Amer: >60 mL/min (ref >60)
GFR calc non Af Amer: >60 mL/min (ref >60)
Glucose, Bld: 97 mg/dL (ref 70–99)
Potassium: 2.6 mmol/L — CL (ref 3.5–5.1)
Sodium: 137 mmol/L (ref 135–145)
Total Bilirubin: 0.3 mg/dL (ref 0.3–1.2)
Total Protein: 6.7 g/dL (ref 6.5–8.1)

## 2019-01-13 LAB — CBC WITH DIFFERENTIAL/PLATELET
Abs Immature Granulocytes: 0.01 10*3/uL (ref 0.00–0.07)
Basophils Absolute: 0 10*3/uL (ref 0.0–0.1)
Basophils Relative: 0 %
Eosinophils Absolute: 0 10*3/uL (ref 0.0–0.5)
Eosinophils Relative: 0 %
HCT: 28.6 % — ABNORMAL LOW (ref 36.0–46.0)
Hemoglobin: 8.2 g/dL — ABNORMAL LOW (ref 12.0–15.0)
Immature Granulocytes: 0 %
Lymphocytes Relative: 40 %
Lymphs Abs: 2 10*3/uL (ref 0.7–4.0)
MCH: 21.1 pg — ABNORMAL LOW (ref 26.0–34.0)
MCHC: 28.7 g/dL — ABNORMAL LOW (ref 30.0–36.0)
MCV: 73.7 fL — ABNORMAL LOW (ref 80.0–100.0)
Monocytes Absolute: 0.3 10*3/uL (ref 0.1–1.0)
Monocytes Relative: 7 %
Neutro Abs: 2.6 10*3/uL (ref 1.7–7.7)
Neutrophils Relative %: 53 %
Platelets: 277 10*3/uL (ref 150–400)
RBC: 3.88 MIL/uL (ref 3.87–5.11)
RDW: 19.2 % — ABNORMAL HIGH (ref 11.5–15.5)
WBC: 4.9 10*3/uL (ref 4.0–10.5)
nRBC: 0 % (ref 0.0–0.2)

## 2019-01-13 LAB — ECHOCARDIOGRAM COMPLETE
Height: 62 in
Weight: 2249.6 oz

## 2019-01-13 LAB — CBC
HCT: 25.8 % — ABNORMAL LOW (ref 36.0–46.0)
Hemoglobin: 7.3 g/dL — ABNORMAL LOW (ref 12.0–15.0)
MCH: 21.2 pg — ABNORMAL LOW (ref 26.0–34.0)
MCHC: 28.3 g/dL — ABNORMAL LOW (ref 30.0–36.0)
MCV: 75 fL — ABNORMAL LOW (ref 80.0–100.0)
Platelets: 218 10*3/uL (ref 150–400)
RBC: 3.44 MIL/uL — ABNORMAL LOW (ref 3.87–5.11)
RDW: 19 % — ABNORMAL HIGH (ref 11.5–15.5)
WBC: 3.6 10*3/uL — ABNORMAL LOW (ref 4.0–10.5)
nRBC: 0 % (ref 0.0–0.2)

## 2019-01-13 LAB — BASIC METABOLIC PANEL
Anion gap: 5 (ref 5–15)
BUN: 5 mg/dL — ABNORMAL LOW (ref 6–20)
CO2: 23 mmol/L (ref 22–32)
Calcium: 7.6 mg/dL — ABNORMAL LOW (ref 8.9–10.3)
Chloride: 113 mmol/L — ABNORMAL HIGH (ref 98–111)
Creatinine, Ser: 0.55 mg/dL (ref 0.44–1.00)
GFR calc Af Amer: >60 mL/min (ref >60)
GFR calc non Af Amer: >60 mL/min (ref >60)
Glucose, Bld: 93 mg/dL (ref 70–99)
Potassium: 3.9 mmol/L (ref 3.5–5.1)
Sodium: 141 mmol/L (ref 135–145)

## 2019-01-13 LAB — FOLATE: Folate: 12.7 ng/mL (ref >5.9)

## 2019-01-13 LAB — MAGNESIUM: Magnesium: 1.6 mg/dL — ABNORMAL LOW (ref 1.7–2.4)

## 2019-01-13 LAB — TROPONIN I
Troponin I: <0.03 ng/mL (ref <0.03)
Troponin I: <0.03 ng/mL (ref <0.03)
Troponin I: <0.03 ng/mL (ref <0.03)

## 2019-01-13 LAB — FERRITIN: Ferritin: 4 ng/mL — ABNORMAL LOW (ref 11–307)

## 2019-01-13 LAB — RETICULOCYTES
Immature Retic Fract: 18.7 % — ABNORMAL HIGH (ref 2.3–15.9)
RBC.: 3.87 MIL/uL (ref 3.87–5.11)
Retic Count, Absolute: 48.4 10*3/uL (ref 19.0–186.0)
Retic Ct Pct: 1.3 % (ref 0.4–3.1)

## 2019-01-13 LAB — PREPARE RBC (CROSSMATCH)

## 2019-01-13 MED ORDER — MAGNESIUM SULFATE 2 GM/50ML IV SOLN
2.0000 g | Freq: Once | INTRAVENOUS | Status: AC
  Administered 2019-01-13: 2 g via INTRAVENOUS
  Filled 2019-01-13: qty 50

## 2019-01-13 MED ORDER — DIPHENHYDRAMINE HCL 25 MG PO CAPS
25.0000 mg | ORAL_CAPSULE | Freq: Four times a day (QID) | ORAL | Status: DC | PRN
  Administered 2019-01-13: 25 mg via ORAL
  Filled 2019-01-13: qty 1

## 2019-01-13 MED ORDER — ONDANSETRON HCL 4 MG PO TABS: 4.0000 mg | ORAL_TABLET | Freq: Four times a day (QID) | ORAL | Status: DC | PRN

## 2019-01-13 MED ORDER — MONTELUKAST SODIUM 10 MG PO TABS
10.0000 mg | ORAL_TABLET | Freq: Every day | ORAL | Status: DC
  Administered 2019-01-13: 10 mg via ORAL
  Filled 2019-01-13: qty 1

## 2019-01-13 MED ORDER — ONDANSETRON HCL 4 MG/2ML IJ SOLN: 4.0000 mg | Freq: Four times a day (QID) | INTRAMUSCULAR | Status: DC | PRN

## 2019-01-13 MED ORDER — CROMOLYN SODIUM 4 % OP SOLN
1.0000 [drp] | Freq: Four times a day (QID) | OPHTHALMIC | Status: DC
  Filled 2019-01-13: qty 10

## 2019-01-13 MED ORDER — ALBUTEROL SULFATE (2.5 MG/3ML) 0.083% IN NEBU: 2.5000 mg | INHALATION_SOLUTION | Freq: Four times a day (QID) | RESPIRATORY_TRACT | Status: DC | PRN

## 2019-01-13 MED ORDER — HYDROCODONE-ACETAMINOPHEN 5-325 MG PO TABS: 1.0000 | ORAL_TABLET | Freq: Three times a day (TID) | ORAL | 0 refills | Status: DC | PRN

## 2019-01-13 MED ORDER — ACETAMINOPHEN 650 MG RE SUPP: 650.0000 mg | Freq: Four times a day (QID) | RECTAL | Status: DC | PRN

## 2019-01-13 MED ORDER — PANTOPRAZOLE SODIUM 40 MG PO TBEC
40.0000 mg | DELAYED_RELEASE_TABLET | Freq: Every day | ORAL | Status: DC
  Administered 2019-01-13 – 2019-01-14 (×2): 40 mg via ORAL
  Filled 2019-01-13 (×2): qty 1

## 2019-01-13 MED ORDER — SODIUM CHLORIDE 0.9% IV SOLUTION: Freq: Once | INTRAVENOUS | Status: DC

## 2019-01-13 MED ORDER — SODIUM CHLORIDE 0.9% IV SOLUTION
Freq: Once | INTRAVENOUS | Status: AC
  Administered 2019-01-13: 12:00:00 via INTRAVENOUS

## 2019-01-13 MED ORDER — POTASSIUM CHLORIDE 10 MEQ/100ML IV SOLN
10.0000 meq | INTRAVENOUS | Status: AC
  Administered 2019-01-13 (×3): 10 meq via INTRAVENOUS
  Filled 2019-01-13 (×3): qty 100

## 2019-01-13 MED ORDER — POTASSIUM CHLORIDE IN NACL 40-0.9 MEQ/L-% IV SOLN
INTRAVENOUS | Status: DC
  Filled 2019-01-13 (×2): qty 1000

## 2019-01-13 MED ORDER — HYDROCODONE-ACETAMINOPHEN 5-325 MG PO TABS
1.0000 | ORAL_TABLET | Freq: Three times a day (TID) | ORAL | Status: DC | PRN
  Administered 2019-01-13 (×2): 1 via ORAL
  Filled 2019-01-13 (×3): qty 1

## 2019-01-13 MED ORDER — ALPRAZOLAM 0.5 MG PO TABS
0.5000 mg | ORAL_TABLET | Freq: Two times a day (BID) | ORAL | Status: DC | PRN
  Administered 2019-01-13 – 2019-01-14 (×2): 0.5 mg via ORAL
  Filled 2019-01-13 (×2): qty 1

## 2019-01-13 MED ORDER — IOHEXOL 300 MG/ML  SOLN
100.0000 mL | Freq: Once | INTRAMUSCULAR | Status: AC | PRN
  Administered 2019-01-13: 100 mL via INTRAVENOUS

## 2019-01-13 MED ORDER — BACLOFEN 10 MG PO TABS
10.0000 mg | ORAL_TABLET | Freq: Three times a day (TID) | ORAL | Status: DC | PRN
  Administered 2019-01-13 – 2019-01-14 (×3): 10 mg via ORAL
  Filled 2019-01-13 (×3): qty 1

## 2019-01-13 MED ORDER — PRAVASTATIN SODIUM 10 MG PO TABS
20.0000 mg | ORAL_TABLET | Freq: Every day | ORAL | Status: DC
  Administered 2019-01-13 – 2019-01-14 (×2): 20 mg via ORAL
  Filled 2019-01-13 (×2): qty 2

## 2019-01-13 MED ORDER — MOMETASONE FURO-FORMOTEROL FUM 100-5 MCG/ACT IN AERO
2.0000 | INHALATION_SPRAY | Freq: Two times a day (BID) | RESPIRATORY_TRACT | Status: DC
  Administered 2019-01-14: 2 via RESPIRATORY_TRACT
  Filled 2019-01-13: qty 8.8

## 2019-01-13 MED ORDER — IOPAMIDOL (ISOVUE-300) INJECTION 61%
50.0000 mL | Freq: Once | INTRAVENOUS | Status: AC | PRN
  Administered 2019-01-13: 30 mL via ORAL

## 2019-01-13 MED ORDER — POLYSACCHARIDE IRON COMPLEX 150 MG PO CAPS
150.0000 mg | ORAL_CAPSULE | Freq: Two times a day (BID) | ORAL | Status: DC
  Administered 2019-01-13 – 2019-01-14 (×3): 150 mg via ORAL
  Filled 2019-01-13 (×4): qty 1

## 2019-01-13 MED ORDER — LORATADINE 10 MG PO TABS
10.0000 mg | ORAL_TABLET | Freq: Every day | ORAL | Status: DC
  Administered 2019-01-13 – 2019-01-14 (×2): 10 mg via ORAL
  Filled 2019-01-13 (×2): qty 1

## 2019-01-13 MED ORDER — KETOTIFEN FUMARATE 0.025 % OP SOLN
1.0000 [drp] | Freq: Two times a day (BID) | OPHTHALMIC | Status: DC
  Administered 2019-01-13: 1 [drp] via OPHTHALMIC
  Filled 2019-01-13: qty 5

## 2019-01-13 MED ORDER — CYCLOSPORINE 0.05 % OP EMUL
1.0000 [drp] | Freq: Two times a day (BID) | OPHTHALMIC | Status: DC
  Administered 2019-01-13 – 2019-01-14 (×3): 1 [drp] via OPHTHALMIC
  Filled 2019-01-13 (×3): qty 30

## 2019-01-13 MED ORDER — POLYSACCHARIDE IRON COMPLEX 150 MG PO CAPS: 150.0000 mg | ORAL_CAPSULE | Freq: Two times a day (BID) | ORAL | Status: DC

## 2019-01-13 MED ORDER — LORAZEPAM 1 MG PO TABS
1.0000 mg | ORAL_TABLET | Freq: Once | ORAL | Status: AC
  Administered 2019-01-13: 1 mg via ORAL
  Filled 2019-01-13: qty 1

## 2019-01-13 MED ORDER — POTASSIUM CHLORIDE CRYS ER 20 MEQ PO TBCR
40.0000 meq | EXTENDED_RELEASE_TABLET | Freq: Once | ORAL | Status: AC
  Administered 2019-01-13: 40 meq via ORAL
  Filled 2019-01-13: qty 2

## 2019-01-13 MED ORDER — SODIUM CHLORIDE 0.9 % IV BOLUS (SEPSIS)
1000.0000 mL | Freq: Once | INTRAVENOUS | Status: AC
  Administered 2019-01-13: 1000 mL via INTRAVENOUS

## 2019-01-13 MED ORDER — ACETAMINOPHEN 325 MG PO TABS: 650.0000 mg | ORAL_TABLET | Freq: Four times a day (QID) | ORAL | Status: DC | PRN

## 2019-01-13 MED ORDER — SODIUM CHLORIDE 0.9 % IV SOLN
510.0000 mg | Freq: Once | INTRAVENOUS | Status: AC
  Administered 2019-01-13: 510 mg via INTRAVENOUS
  Filled 2019-01-13: qty 17

## 2019-01-13 MED ORDER — LINACLOTIDE 145 MCG PO CAPS
145.0000 ug | ORAL_CAPSULE | Freq: Two times a day (BID) | ORAL | Status: DC
  Administered 2019-01-13 – 2019-01-14 (×2): 145 ug via ORAL
  Filled 2019-01-13 (×2): qty 1

## 2019-01-13 MED ORDER — IOPAMIDOL (ISOVUE-370) INJECTION 76%
100.0000 mL | Freq: Once | INTRAVENOUS | Status: AC | PRN
  Administered 2019-01-13: 100 mL via INTRAVENOUS

## 2019-01-13 MED ORDER — LOSARTAN POTASSIUM 50 MG PO TABS
25.0000 mg | ORAL_TABLET | Freq: Two times a day (BID) | ORAL | Status: DC
  Administered 2019-01-13 – 2019-01-14 (×2): 25 mg via ORAL
  Filled 2019-01-13 (×3): qty 1

## 2019-01-13 MED ORDER — SODIUM CHLORIDE 0.9 % IV SOLN
1000.0000 mL | INTRAVENOUS | Status: DC
  Administered 2019-01-13: 1000 mL via INTRAVENOUS

## 2019-01-13 NOTE — ED Notes (Signed)
Pt states she is very dizzy and lightheaded. Have put pt in bed. Hooked up to monitor and put up both side rails. VSS

## 2019-01-13 NOTE — Progress Notes (Signed)
*  PRELIMINARY RESULTS* Echocardiogram 2D Echocardiogram has been performed.  Jennifer Mullins 01/13/2019, 11:31 AM

## 2019-01-13 NOTE — ED Notes (Signed)
Will paged [facility] for NS with Potassium

## 2019-01-13 NOTE — ED Notes (Signed)
Pt to ultrasound

## 2019-01-13 NOTE — ED Notes (Signed)
Pharmacy Tech not able to come to see patient until 9am.

## 2019-01-13 NOTE — ED Notes (Signed)
Pt back from ultrasound. Will administer Iron

## 2019-01-13 NOTE — Progress Notes (Signed)
BP 114/67   Pulse 89   Temp 98.9 F (37.2 C) (Oral)   Ht 5\' 2"  (1.575 m)   Wt 140 lb 9.6 oz (63.8 kg)   BMI 25.72 kg/m    Subjective:    Patient ID: Jennifer Mullins, female    DOB: Apr 16, 1971, 48 y.o.   MRN: [id number]  HPI: Jennifer Mullins is a 48 y.o. female presenting on 01/12/2019 for Fatigue  Patient comes in because of her fatigue.  She had a low hemoglobin late last week.  It was 8.3.  She had a problem last year.  It was related at that time to her dysfunctional uterine bleeding.  She is still under the care of her gynecologist.  All of the information has been sent to him.  She does see him later this week.  In just the last couple days it has come back up to 8.9.  She is taking 2 iron pills a day.  She notes that it does give her some GI distress.  But I told her she has to do that for now.  Refills will be sent on the iron tablet because she will run out by taking it twice daily.  She has not had a colonoscopy ever before.  She does not describe any melena or hematochezia.  However I do think a colonoscopy for her screening and then possible chronic blood loss would be appropriate.  Referral will be placed.  The patient has a very limited amount of hydrocodone that she takes for her fibromyalgia.  We currently have her on a very controlled dose of alprazolam.  And she understands I cannot go any higher.  She does she will need to have psychiatry to cover this medication.  Her pain medicine is going to be lowered to 5 mg.  We will do that for the next few months.  And wound will try to lower it even more after that.  I cannot go any higher and if that happens she will have to go back to pain management.  She states that she understands.  Past Medical History:  Diagnosis Date  . Anemia   . Anemia   . Anxiety   . Arthritis   . Asthma   . Bilateral calcaneal spurs   . Bilateral polycystic ovarian syndrome   . Bulging lumbar disc   . Carpal tunnel syndrome, bilateral   .  Conjunctivitis   . COPD (chronic obstructive pulmonary disease) (HCC)    BRONCHITIS  . Diabetes mellitus without complication ([place])    TYPE 2   DX  4-5 YRS AGO  . ETD (eustachian tube dysfunction)   . ETD (Eustachian tube dysfunction), bilateral    left worse than right  . Fibroids    uterine  . Fibromyalgia   . GAD (generalized anxiety disorder)   . GERD (gastroesophageal reflux disease)    TAKES PRESCRIPTION MEDS  . Glaucoma    BOTH EYES  . Hematoma    Hematoma of Left Hand- had to wear a split  . Hyperlipidemia   . Hypertension   . Hypothyroidism    NODULES ON THYROID  . PONV (postoperative nausea and vomiting)   . Pulmonary embolism ([place])   . Tachycardia   . TMJ (dislocation of temporomandibular joint)   . Trigger finger   . Trigger finger of left thumb    Relevant past medical, surgical, family and social history reviewed and updated as indicated. Interim medical history since our last visit  reviewed. Allergies and medications reviewed and updated. DATA REVIEWED: CHART IN EPIC  Family History reviewed for pertinent findings.  Review of Systems  Constitutional: Positive for fatigue. Negative for activity change and fever.  HENT: Negative.   Eyes: Negative.   Respiratory: Negative.  Negative for cough.   Cardiovascular: Negative.  Negative for chest pain.  Gastrointestinal: Negative.  Negative for abdominal pain.  Endocrine: Negative.   Genitourinary: Positive for dyspareunia, menstrual problem and pelvic pain. Negative for dysuria and hematuria.  Musculoskeletal: Positive for arthralgias and myalgias.  Skin: Negative.   Neurological: Negative.     Allergies as of 01/12/2019      Reactions   Demerol [meperidine] Shortness Of Breath   FLUSHING AND SHORTNESS OF BREATH   Morphine And Related Shortness Of Breath   Flushed and hot hyper Flushed and hot hyper Flushed and hot hyper Flushed and hot hyper   Other Shortness Of Breath   Flushed and hot hyper Flushed  and hot hyper Flushed and hot hyper Flushed and hot hyper Flushed and hot hyper   Cardizem  [diltiazem Hcl] Other (See Comments)   Diltiazem Hcl Hives   Lisinopril    Caused cough   Lyrica [pregabalin]    Chest pain, elevated heart rate   Tramadol Other (See Comments)   Tears up stomach      Medication List       Accurate as of January 12, 2019 11:54 PM. Always use your most recent med list.        albuterol (2.5 MG/3ML) 0.083% nebulizer solution Commonly known as:  PROVENTIL Take 3 mLs (2.5 mg total) by nebulization every 6 (six) hours as needed for wheezing or shortness of breath.   albuterol 108 (90 Base) MCG/ACT inhaler Commonly known as:  PROVENTIL HFA;VENTOLIN HFA Inhale 2 puffs into the lungs every 6 (six) hours as needed for wheezing or shortness of breath.   ALPRAZolam 0.5 MG tablet Commonly known as:  XANAX TAKE 1 TABLET BY MOUTH TWICE DAILY AS NEEDED. MAY TAKE ONE MORE TABLET BEFORE FOR ALLERGY SHOTS ONCE A WEEK (30 DAY supply)   aspirin 81 MG chewable tablet Chew 81 mg by mouth daily.   AUVI-Q 0.3 mg/0.3 mL Soaj injection Generic drug:  EPINEPHrine Use as directed for life-threatening allergic reaction.   baclofen 10 MG tablet Commonly known as:  LIORESAL Take 10 mg by mouth 3 (three) times daily as needed.   budesonide-formoterol 80-4.5 MCG/ACT inhaler Commonly known as:  SYMBICORT Inhale 1-2 puffs into the lungs daily.   cetirizine 10 MG tablet Commonly known as:  ZYRTEC Take 1 tablet (10 mg total) by mouth 2 (two) times daily.   cromolyn 4 % ophthalmic solution Commonly known as:  OPTICROM Place 1 drop into both eyes 4 (four) times daily.   cycloSPORINE 0.05 % ophthalmic emulsion Commonly known as:  RESTASIS Place 1 drop into both eyes 2 (two) times daily.   EPIDUO 0.1-2.5 % gel Generic drug:  Adapalene-Benzoyl Peroxide AFFECTED AREA(S) ON FACE AND CHEST EVERY NIGHT AT BEDTIME   fluticasone 50 MCG/ACT nasal spray Commonly known as:   FLONASE 1-2 sprays in each nostril 3 times weekly as needed.   furosemide 20 MG tablet Commonly known as:  LASIX TAKE 1 TABLET BY MOUTH DAILY AS NEEDED FOR FLUID   HYDROcodone-acetaminophen 7.5-325 MG tablet Commonly known as:  NORCO Take 1 tablet by mouth 3 (three) times daily as needed for moderate pain.   HYDROcodone-acetaminophen 5-325 MG tablet Commonly known as:  NORCO/VICODIN Take 1 tablet by mouth 3 (three) times daily as needed for moderate pain or severe pain.   IRON PO Take 1 tablet by mouth daily as needed (for low hemoglobin levels). Take when hemoglobin is low   iron polysaccharides 150 MG capsule Commonly known as:  IFEREX 150 Take 1 capsule (150 mg total) by mouth 2 (two) times daily.   linaclotide 145 MCG Caps capsule Commonly known as:  LINZESS Take 1 capsule (145 mcg total) by mouth 2 (two) times daily.   losartan 25 MG tablet Commonly known as:  COZAAR Take 1 tablet (25 mg total) by mouth 2 (two) times daily.   metFORMIN 500 MG tablet Commonly known as:  GLUCOPHAGE TAKE 1 TABLET BY MOUTH EVERY DAY WITH BREAKFAST   montelukast 10 MG tablet Commonly known as:  SINGULAIR Take 1 tablet (10 mg total) by mouth at bedtime.   Olopatadine HCl 0.6 % Soln USE TWO SPRAYS IN BOTH NOSTRILS TWICE DAILY   pantoprazole 40 MG tablet Commonly known as:  PROTONIX Take 1 tablet (40 mg total) by mouth daily.   potassium chloride SA 20 MEQ tablet Commonly known as:  K-DUR,KLOR-CON TAKE 1 TABLET BY MOUTH DAILY WHILE TAKING LASIX   pravastatin 20 MG tablet Commonly known as:  PRAVACHOL Take 1 tablet (20 mg total) by mouth daily.   SM COMPLETE ADVANCED FORMULA Tabs Take by mouth.   VANIQA 13.9 % cream Generic drug:  Eflornithine HCl APPLY TO AFFECTED AREA TWICE DAILY   VOLTAREN 1 % Gel Generic drug:  diclofenac sodium APPLY TWO GRAMS TOPICALLY FOUR TIMES DAILY          Objective:    BP 114/67   Pulse 89   Temp 98.9 F (37.2 C) (Oral)   Ht 5\' 2"   (1.575 m)   Wt 140 lb 9.6 oz (63.8 kg)   BMI 25.72 kg/m   Allergies  Allergen Reactions  . Demerol [Meperidine] Shortness Of Breath    FLUSHING AND SHORTNESS OF BREATH  . Morphine And Related Shortness Of Breath    Flushed and hot hyper Flushed and hot hyper Flushed and hot hyper Flushed and hot hyper  . Other Shortness Of Breath    Flushed and hot hyper Flushed and hot hyper Flushed and hot hyper Flushed and hot hyper Flushed and hot hyper  . Cardizem  [Diltiazem Hcl] Other (See Comments)  . Diltiazem Hcl Hives  . Lisinopril     Caused cough  . Lyrica [Pregabalin]     Chest pain, elevated heart rate  . Tramadol Other (See Comments)    Tears up stomach    Wt Readings from Last 3 Encounters:  01/12/19 140 lb 9.6 oz (63.8 kg)  01/06/19 140 lb 12.8 oz (63.9 kg)  09/30/18 147 lb (66.7 kg)    Physical Exam Constitutional:      Appearance: She is well-developed.  HENT:     Head: Normocephalic and atraumatic.  Eyes:     Conjunctiva/sclera: Conjunctivae normal.     Pupils: Pupils are equal, round, and reactive to light.  Cardiovascular:     Rate and Rhythm: Normal rate and regular rhythm.     Heart sounds: Normal heart sounds.  Pulmonary:     Effort: Pulmonary effort is normal.     Breath sounds: Normal breath sounds.  Abdominal:     General: Bowel sounds are normal. There is no distension.     Palpations: Abdomen is soft. There is no mass.  Tenderness: There is no abdominal tenderness.     Hernia: No hernia is present.  Skin:    General: Skin is warm and dry.     Findings: No rash.  Neurological:     Mental Status: She is alert and oriented to person, place, and time.     Deep Tendon Reflexes: Reflexes are normal and symmetric.  Psychiatric:        Behavior: Behavior normal.        Thought Content: Thought content normal.        Judgment: Judgment normal.     Results for orders placed or performed in visit on 01/12/19  Hemoglobin, fingerstick  Result  Value Ref Range   Hemoglobin 8.9 (L) 11.1 - 15.9 g/dL      Assessment & Plan:   1. Low hemoglobin Keep appointment with GYN on 01/15/2001 - Hemoglobin, fingerstick - Ambulatory referral to Gastroenterology - CBC with Differential/Platelet; Future - Iron; Future  2. Screening for colon cancer - Ambulatory referral to Gastroenterology  3. Fibromyalgia - HYDROcodone-acetaminophen (NORCO/VICODIN) 5-325 MG tablet; Take 1 tablet by mouth 3 (three) times daily as needed for moderate pain or severe pain.  Dispense: 90 tablet; Refill: 0 We have had a discussion that I cannot go any higher than this amount.  And if she requires more she will have to go back to pain management.  4. Iron deficiency anemia due to chronic blood loss - iron polysaccharides (IFEREX 150) 150 MG capsule; Take 1 capsule (150 mg total) by mouth 2 (two) times daily.  Dispense: 60 capsule; Refill: 5  5. Dysfunctional uterine bleeding - iron polysaccharides (IFEREX 150) 150 MG capsule; Take 1 capsule (150 mg total) by mouth 2 (two) times daily.  Dispense: 60 capsule; Refill: 5   Continue all other maintenance medications as listed above.  Follow up plan: Return in about 3 months (around 04/12/2019) for recheck.  Educational handout given for [place] PA-C [facility] [address]  [place], [place] [address] [phone]   01/13/2019, 10:00 AM

## 2019-01-13 NOTE — H&P (Signed)
History and Physical  Jennifer Mullins FXT:[id number] DOB: 09-Nov-1971 DOA: 01/12/2019   PCP: Terald Sleeper, PA-C   Patient coming from: Home  Chief Complaint: chest discomfort/sob/dizz  HPI:  Jennifer Mullins is a 48 y.o. female with medical history of menorrhagia, hypertension, iron deficiency anemia, COPD/asthma, fibromyalgia, toxic multinodular goiter presenting with chest tightness and shortness of breath while going to the bathroom on 01/12/2019.  She stated that the discomfort radiated to her bilateral shoulders.  She also has complained of some dizziness with standing.  The patient stated that she had a D&C performed on 12/15/2018 for menorrhagia.  She stated that she had sexual intercourse on 01/10/2019 after which she began having some spotting.  Since then, she has had vaginal bleeding with clots.  She states that she has not checked if she has had any hematochezia or melena.  She has began having some lower abdominal cramping.  She denies any fevers, chills, vomiting, diarrhea, hematuria.  She has an appointment to see her OB/GYN on 01/15/2019.  Upon presentation, the patient was noted to have hemoglobin of 8.2.  She had hemoglobin of 8.3 on 01/06/2019.  The patient states that she has not been compliant with her iron supplementation at home.  She only took it for 1 week after her D&C.  Assessment/Plan: Symptomatic anemia/acute blood loss anemia -Secondary to menorrhagia -Case discussed with the patient's OB/GYN, Dr. Evie Lacks -Pelvic ultrasound -Transfuse 2 units PRBC -Infuse IV iron -Baseline hemoglobin 10-11 -Presented with hemoglobin 8.2  Iron deficiency anemia -Secondary to menorrhagia -Transfuse Feraheme -Start Niferex -Iron saturation 4%, ferritin 4  Chest discomfort -CT angiogram chest negative for pulmonary embolus -Cycle troponins -Personally reviewed EKG--sinus rhythm without ST-T wave changes  COPD/asthma -Stable on room air presently -Continue  Symbicort  Essential hypertension -Continue losartan  Menorrhagia -Patient has follow-up appointment with her OB/GYN on 01/15/2019 -Hold off on megestrol for now given the patient's history of pulmonary embolus  Hyperlipidemia -Continue statin  Hypokalemia/hypomagnesemia -replete -mag 1.6      Past Medical History:  Diagnosis Date  . Anemia   . Anemia   . Anxiety   . Arthritis   . Asthma   . Bilateral calcaneal spurs   . Bilateral polycystic ovarian syndrome   . Bulging lumbar disc   . Carpal tunnel syndrome, bilateral   . Conjunctivitis   . COPD (chronic obstructive pulmonary disease) (HCC)    BRONCHITIS  . Diabetes mellitus without complication ([place])    TYPE 2   DX  4-5 YRS AGO  . ETD (eustachian tube dysfunction)   . ETD (Eustachian tube dysfunction), bilateral    left worse than right  . Fibroids    uterine  . Fibromyalgia   . GAD (generalized anxiety disorder)   . GERD (gastroesophageal reflux disease)    TAKES PRESCRIPTION MEDS  . Glaucoma    BOTH EYES  . Hematoma    Hematoma of Left Hand- had to wear a split  . Hyperlipidemia   . Hypertension   . Hypothyroidism    NODULES ON THYROID  . PONV (postoperative nausea and vomiting)   . Pulmonary embolism ([place])   . Tachycardia   . TMJ (dislocation of temporomandibular joint)   . Trigger finger   . Trigger finger of left thumb    Past Surgical History:  Procedure Laterality Date  . ANTERIOR CERVICAL DECOMP/DISCECTOMY FUSION N/A 11/13/2016   Procedure: Cervical five-six, Cervical six-seven Anterior Cervical Discectomy and Fusion, Allograft, Plate;  Surgeon: Marybelle Killings, MD;  Location: [facility];  Service: Orthopedics;  Laterality: N/A;  . BREAST SURGERY    . CARPAL TUNNEL RELEASE    . DILATION AND CURETTAGE OF UTERUS    . EXPLORATORY LAPAROTOMY    . LIPOMA EXCISION Left 06/15/2018   Procedure: LEFT CALF LIPOMA EXCISION;  Surgeon: Marybelle Killings, MD;  Location: [facility];  Service:  Orthopedics;  Laterality: Left;  . NASAL SINUS SURGERY  07/22/13   Dr. Redmond Pulling in [place]  . TRIGGER FINGER RELEASE Right 06/15/2018   Procedure: RIGHT TRIGGER THUMB RELEASE;  Surgeon: Marybelle Killings, MD;  Location: [facility];  Service: Orthopedics;  Laterality: Right;   Social History:  reports that she quit smoking about 29 years ago. Her smoking use included cigarettes. She has a 1.25 pack-year smoking history. She has never used smokeless tobacco. She reports that she does not drink alcohol or use drugs.   Family History  Problem Relation Age of Onset  . Hypertension Mother   . Hypertension Father      Allergies  Allergen Reactions  . Demerol [Meperidine] Shortness Of Breath    FLUSHING AND SHORTNESS OF BREATH  . Morphine And Related Shortness Of Breath    Flushed and hot hyper Flushed and hot hyper Flushed and hot hyper Flushed and hot hyper  . Other Shortness Of Breath    Flushed and hot hyper Flushed and hot hyper Flushed and hot hyper Flushed and hot hyper Flushed and hot hyper  . Cardizem  [Diltiazem Hcl] Other (See Comments)  . Diltiazem Hcl Hives  . Lisinopril     Caused cough  . Lyrica [Pregabalin]     Chest pain, elevated heart rate  . Tramadol Other (See Comments)    Tears up stomach     Prior to Admission medications   Medication Sig Start Date End Date Taking? Authorizing Provider  albuterol (PROVENTIL HFA;VENTOLIN HFA) 108 (90 Base) MCG/ACT inhaler Inhale 2 puffs into the lungs every 6 (six) hours as needed for wheezing or shortness of breath. 11/03/18   Eustaquio Maize, MD  albuterol (PROVENTIL) (2.5 MG/3ML) 0.083% nebulizer solution Take 3 mLs (2.5 mg total) by nebulization every 6 (six) hours as needed for wheezing or shortness of breath. 10/27/18   Kozlow, Donnamarie Poag, MD  ALPRAZolam Duanne Moron) 0.5 MG tablet TAKE 1 TABLET BY MOUTH TWICE DAILY AS NEEDED. MAY TAKE ONE MORE TABLET BEFORE FOR ALLERGY SHOTS ONCE A WEEK (30 DAY supply) 01/06/19   Terald Sleeper, PA-C  aspirin 81 MG chewable tablet Chew 81 mg by mouth daily.    [provider]  AUVI-Q 0.3 MG/0.3ML SOAJ injection Use as directed for life-threatening allergic reaction. 04/24/36   Delora Fuel, MD  baclofen (LIORESAL) 10 MG tablet Take 10 mg by mouth 3 (three) times daily as needed. 05/14/18   [provider]  budesonide-formoterol (SYMBICORT) 80-4.5 MCG/ACT inhaler Inhale 1-2 puffs into the lungs daily. 11/20/18   Kozlow, Donnamarie Poag, MD  cetirizine (ZYRTEC) 10 MG tablet Take 1 tablet (10 mg total) by mouth 2 (two) times daily. 07/02/18   Kozlow, Donnamarie Poag, MD  cromolyn (OPTICROM) 4 % ophthalmic solution Place 1 drop into both eyes 4 (four) times daily.  03/20/17   [provider]  cycloSPORINE (RESTASIS) 0.05 % ophthalmic emulsion Place 1 drop into both eyes 2 (two) times daily.    [provider]  EPIDUO 0.1-2.5 % gel AFFECTED AREA(S) ON FACE AND CHEST  EVERY NIGHT AT BEDTIME 01/06/19   Terald Sleeper, PA-C  fluticasone [facility]) 50 MCG/ACT nasal spray 1-2 sprays in each nostril 3 times weekly as needed. 10/27/18   Kozlow, Donnamarie Poag, MD  furosemide (LASIX) 20 MG tablet TAKE 1 TABLET BY MOUTH DAILY AS NEEDED FOR FLUID 01/06/19   Terald Sleeper, PA-C  HYDROcodone-acetaminophen (NORCO) 7.5-325 MG tablet Take 1 tablet by mouth 3 (three) times daily as needed for moderate pain.    [provider]  HYDROcodone-acetaminophen (NORCO/VICODIN) 5-325 MG tablet Take 1 tablet by mouth 3 (three) times daily as needed for moderate pain or severe pain. 01/12/19   Terald Sleeper, PA-C  IRON PO Take 1 tablet by mouth daily as needed (for low hemoglobin levels). Take when hemoglobin is low 09/11/17   [provider]  iron polysaccharides (IFEREX 150) 150 MG capsule Take 1 capsule (150 mg total) by mouth 2 (two) times daily. 01/12/19   Terald Sleeper, PA-C  linaclotide [facility]) 145 MCG CAPS capsule Take 1 capsule (145 mcg total) by mouth 2 (two) times daily. 01/12/19    Terald Sleeper, PA-C  losartan (COZAAR) 25 MG tablet Take 1 tablet (25 mg total) by mouth 2 (two) times daily. 01/06/19   Terald Sleeper, PA-C  metFORMIN (GLUCOPHAGE) 500 MG tablet TAKE 1 TABLET BY MOUTH EVERY DAY WITH BREAKFAST 10/24/18   Eustaquio Maize, MD  montelukast (SINGULAIR) 10 MG tablet Take 1 tablet (10 mg total) by mouth at bedtime. 10/27/18   Kozlow, Donnamarie Poag, MD  Multiple Vitamins-Minerals (SM COMPLETE ADVANCED FORMULA) TABS Take by mouth. 09/11/17   [provider]  Olopatadine HCl 0.6 % SOLN USE TWO SPRAYS IN BOTH NOSTRILS TWICE DAILY 12/31/18   Kozlow, Donnamarie Poag, MD  pantoprazole (PROTONIX) 40 MG tablet Take 1 tablet (40 mg total) by mouth daily. 01/06/19   Terald Sleeper, PA-C  potassium chloride SA (K-DUR,KLOR-CON) 20 MEQ tablet TAKE 1 TABLET BY MOUTH DAILY WHILE TAKING LASIX 12/04/18   Eustaquio Maize, MD  pravastatin (PRAVACHOL) 20 MG tablet Take 1 tablet (20 mg total) by mouth daily. 01/06/19   Terald Sleeper, PA-C  VANIQA 13.9 % cream APPLY TO AFFECTED AREA TWICE DAILY 01/06/19   Terald Sleeper, PA-C  VOLTAREN 1 % GEL APPLY TWO GRAMS TOPICALLY FOUR TIMES DAILY 08/24/18   Claretta Fraise, MD    Review of Systems:  Constitutional:  No weight loss, night sweats, Fevers, chills, fatigue.  Head&Eyes: No headache.  No vision loss.  No eye pain or scotoma ENT:  No Difficulty swallowing,Tooth/dental problems,Sore throat,  No ear ache, post nasal drip,  Cardio-vascular:  No chest pain, Orthopnea, PND, swelling in lower extremities,  dizziness, palpitations  GI:  No  vomiting, diarrhea, loss of appetite, hematochezia, melena, heartburn, indigestion, Resp:  No shortness of breath with exertion or at rest. No cough. No coughing up of blood .No wheezing.No chest wall deformity  Skin:  no rash or lesions.  GU:  no dysuria, change in color of urine, no urgency or frequency. No flank pain.  Musculoskeletal:  No joint pain or swelling. No decreased range of motion. No back pain.   Psych:  No change in mood or affect. No depression or anxiety. Neurologic: No headache, no dysesthesia, no focal weakness, no vision loss. No syncope  Physical Exam: Vitals:   01/13/19 0400 01/13/19 0515 01/13/19 0600 01/13/19 0700  BP:   126/79 127/78  Pulse: 90 78 82 85  Resp: 18 18  13 17  Temp:      TempSrc:      SpO2: 99% 100% 100% 100%   General:  A&O x 3, NAD, nontoxic, pleasant/cooperative Head/Eye: No conjunctival hemorrhage, no icterus, [place]/AT, No nystagmus ENT:  No icterus,  No thrush, good dentition, no pharyngeal exudate Neck:  No masses, no lymphadenpathy, no bruits CV:  RRR, no rub, no gallop, no S3 Lung:  CTAB, good air movement, no wheeze, no rhonchi Abdomen: soft/NT, +BS, nondistended, no peritoneal signs Ext: No cyanosis, No rashes, No petechiae, No lymphangitis, No edema Neuro: CNII-XII intact, strength 4/5 in bilateral upper and lower extremities, no dysmetria  Labs on Admission:  Basic Metabolic Panel: Recent Labs  Lab 01/06/19 0924 01/13/19 0011 01/13/19 0605  NA 144 137 141  K 3.4* 2.6* 3.9  CL 105 104 113*  CO2 22 24 23   GLUCOSE 86 97 93  BUN 7 8 5*  CREATININE 0.66 0.62 0.55  CALCIUM 9.0 8.3* 7.6*  MG  --  1.6*  --    Liver Function Tests: Recent Labs  Lab 01/06/19 0924 01/13/19 0011  AST 18 17  ALT 10 14  ALKPHOS 84 84  BILITOT 0.3 0.3  PROT 6.8 6.7  ALBUMIN 4.4 3.8   No results for input(s): LIPASE, AMYLASE in the last 168 hours. No results for input(s): AMMONIA in the last 168 hours. CBC: Recent Labs  Lab 01/06/19 0924 01/13/19 0011 01/13/19 0605  WBC 4.6 4.9 3.6*  NEUTROABS 2.7 2.6  --   HGB 8.3* 8.2* 7.3*  HCT 26.9* 28.6* 25.8*  MCV 70* 73.7* 75.0*  PLT 305 277 218   Coagulation Profile: Recent Labs  Lab 01/13/19 0011  INR 1.0   Cardiac Enzymes: Recent Labs  Lab 01/13/19 0011  TROPONINI <0.03   BNP: Invalid input(s): POCBNP CBG: No results for input(s): GLUCAP in the last 168 hours. Urine analysis:     Component Value Date/Time   COLORURINE YELLOW 12/09/2017 2250   APPEARANCEUR Clear 04/22/2018 0906   LABSPEC 1.014 12/09/2017 2250   PHURINE 5.0 12/09/2017 2250   GLUCOSEU Negative 04/22/2018 0906   HGBUR LARGE (A) 12/09/2017 2250   BILIRUBINUR Negative 04/22/2018 0906   KETONESUR NEGATIVE 12/09/2017 2250   PROTEINUR Negative 04/22/2018 0906   PROTEINUR NEGATIVE 12/09/2017 2250   UROBILINOGEN negative 10/31/2014 1347   NITRITE Negative 04/22/2018 0906   NITRITE NEGATIVE 12/09/2017 2250   LEUKOCYTESUR Negative 04/22/2018 0906   Sepsis Labs: @LABRCNTIP (procalcitonin:4,lacticidven:4) ) Recent Results (from the past 240 hour(s))  Wet prep, genital     Status: Abnormal   Collection Time: 01/13/19  2:22 AM  Result Value Ref Range Status   Yeast Wet Prep HPF POC NONE SEEN NONE SEEN Final   Trich, Wet Prep NONE SEEN NONE SEEN Final   Clue Cells Wet Prep HPF POC NONE SEEN NONE SEEN Final   WBC, Wet Prep HPF POC FEW (A) NONE SEEN Final   Sperm NONE SEEN  Final    Comment: Performed at [facility], [address], [place], [place] [address]     Radiological Exams on Admission: Ct Angio Chest Pe W And/or Wo Contrast  Result Date: 01/13/2019 CLINICAL DATA:  Weakness and shortness of breath. Anemia. Chest tightness. Elevated D-dimer. EXAM: CT ANGIOGRAPHY CHEST WITH CONTRAST TECHNIQUE: Multidetector CT imaging of the chest was performed using the standard protocol during bolus administration of intravenous contrast. Multiplanar CT image reconstructions and MIPs were obtained to evaluate the vascular anatomy. CONTRAST:  175mL ISOVUE-370 IOPAMIDOL (ISOVUE-370) INJECTION  76% COMPARISON:  07/08/2017 FINDINGS: Cardiovascular: Good opacification of the central and segmental pulmonary arteries. No focal filling defects. No evidence of significant pulmonary embolus. Normal caliber thoracic aorta. No aortic dissection. Great vessel origins are patent. Normal heart size. No pericardial effusions.  Mediastinum/Nodes: Esophagus is decompressed. No significant lymphadenopathy in the chest. Lungs/Pleura: Nodular scarring in the anterior left lung base, unchanged since prior study. Lungs are otherwise clear and expanded. No pleural effusions. No pneumothorax. Airways are patent. Upper Abdomen: No acute abnormalities. Musculoskeletal: No chest wall abnormality. No acute or significant osseous findings. Review of the MIP images confirms the above findings. IMPRESSION: No evidence of significant pulmonary embolus. No evidence of active pulmonary disease. Electronically Signed   By: Lucienne Capers M.D.   On: 01/13/2019 02:25    EKG: Independently reviewed.  Sinus rhythm, no ST ST wave changes    Time spent:60 minutes Code Status:   FULL Family Communication:  No Family at bedside Disposition Plan: expect 1-2 day hospitalization Consults called: none DVT Prophylaxis: SCDs  Orson Eva, DO  [facility] Pager [phone]  If 7PM-7AM, please contact night-coverage [email or web address] Password [email or web address] 01/13/2019, 7:39 AM

## 2019-01-13 NOTE — ED Provider Notes (Signed)
[facility] EMERGENCY DEPARTMENT Provider Note   CSN: [id number] Arrival date & time: 01/12/19  2354    History   Chief Complaint Chief Complaint  Patient presents with  . Shortness of Breath    HPI Jennifer Mullins is a 48 y.o. female.     Patient presents via EMS with sudden onset of shortness of breath and chest tightness.  States she went up to go to the bathroom when she became short of breath and felt that she had some tightness in her chest that rated to both shoulders.  This lasted for several minutes and has since resolved.  She still feels dizzy and lightheaded especially when she stands up.  She is concerned she could have worsening anemia.  She underwent a D&C about 1 month ago for heavy menstrual bleeding.  States she had some spotting the past several days but has become more bleeding today with clots.  She cannot quantify how many pads or tampons she is used.  She states her hemoglobin was 8.2 last week but was then rechecked at 8.9 later this week.  Her iron tablets were increased to twice a day.  States she does have a history of pulmonary embolism but is no longer anticoagulated.  She is on anticoagulation for several years.  She denies any focal weakness, numbness or tingling.  No fevers or chills.  Denies any black or bloody stools.  The history is provided by the patient.    Past Medical History:  Diagnosis Date  . Anemia   . Anemia   . Anxiety   . Arthritis   . Asthma   . Bilateral calcaneal spurs   . Bilateral polycystic ovarian syndrome   . Bulging lumbar disc   . Carpal tunnel syndrome, bilateral   . Conjunctivitis   . COPD (chronic obstructive pulmonary disease) (HCC)    BRONCHITIS  . Diabetes mellitus without complication ([place])    TYPE 2   DX  4-5 YRS AGO  . ETD (eustachian tube dysfunction)   . ETD (Eustachian tube dysfunction), bilateral    left worse than right  . Fibroids    uterine  . Fibromyalgia   . GAD (generalized anxiety disorder)   .  GERD (gastroesophageal reflux disease)    TAKES PRESCRIPTION MEDS  . Glaucoma    BOTH EYES  . Hematoma    Hematoma of Left Hand- had to wear a split  . Hyperlipidemia   . Hypertension   . Hypothyroidism    NODULES ON THYROID  . PONV (postoperative nausea and vomiting)   . Pulmonary embolism ([place])   . Tachycardia   . TMJ (dislocation of temporomandibular joint)   . Trigger finger   . Trigger finger of left thumb     Patient Active Problem List   Diagnosis Date Noted  . Low hemoglobin 01/12/2019  . Fibromyalgia 07/09/2018  . Trigger thumb, right thumb 06/15/2018  . Lipoma of lower extremity 06/15/2018  . 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 Pelvic pain in female 12/11/2017  . Dyspareunia, female 12/11/2017  . Menometrorrhagia 12/11/2017  . Pain in right ankle and joints of right foot 10/29/2017  . Chronic pain of right knee 10/29/2017  . Right elbow pain 10/29/2017  . Iron deficiency anemia due to chronic blood loss 07/07/2017  . S/P cervical spinal fusion 02/27/2017  . Dysfunctional uterine bleeding 11/24/2016  . Anticoagulated 11/24/2016  . Microcytic anemia 11/24/2016  . Tachycardia   . Vaginal bleeding   . Allergic  rhinoconjunctivitis 07/28/2015  . Chronic headache 07/28/2015  . PCOS (polycystic ovarian syndrome) 06/20/2015  . 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 Hyperlipidemia 06/20/2015  . GAD (generalized anxiety disorder) 05/18/2014  . GERD (gastroesophageal reflux disease) 05/18/2014  . Body fluid retention 07/07/2013  . Chronic sinusitis 07/02/2013  . Extrinsic asthma 05/20/2013  . HTN (hypertension) 05/20/2013    Past Surgical History:  Procedure Laterality Date  . ANTERIOR CERVICAL DECOMP/DISCECTOMY FUSION N/A 11/13/2016   Procedure: Cervical five-six, Cervical six-seven Anterior Cervical Discectomy and Fusion, Allograft, Plate;  Surgeon: Marybelle Killings, MD;  Location: [place];  Service: Orthopedics;  Laterality: N/A;  . BREAST SURGERY    .  CARPAL TUNNEL RELEASE    . DILATION AND CURETTAGE OF UTERUS    . EXPLORATORY LAPAROTOMY    . LIPOMA EXCISION Left 06/15/2018   Procedure: LEFT CALF LIPOMA EXCISION;  Surgeon: Marybelle Killings, MD;  Location: [place];  Service: Orthopedics;  Laterality: Left;  . NASAL SINUS SURGERY  07/22/13   Dr. Redmond Pulling in [place]  . TRIGGER FINGER RELEASE Right 06/15/2018   Procedure: RIGHT TRIGGER THUMB RELEASE;  Surgeon: Marybelle Killings, MD;  Location: [place];  Service: Orthopedics;  Laterality: Right;     OB History    Gravida  1   Para  1   Term      Preterm  1   AB      Living  1     SAB      TAB      Ectopic      Multiple      Live Births               Home Medications    Prior to Admission medications   Medication Sig Start Date End Date Taking? Authorizing Provider  albuterol (PROVENTIL HFA;VENTOLIN HFA) 108 (90 Base) MCG/ACT inhaler Inhale 2 puffs into the lungs every 6 (six) hours as needed for wheezing or shortness of breath. 11/03/18   Eustaquio Maize, MD  albuterol (PROVENTIL) (2.5 MG/3ML) 0.083% nebulizer solution Take 3 mLs (2.5 mg total) by nebulization every 6 (six) hours as needed for wheezing or shortness of breath. 10/27/18   Kozlow, Donnamarie Poag, MD  ALPRAZolam Duanne Moron) 0.5 MG tablet TAKE 1 TABLET BY MOUTH TWICE DAILY AS NEEDED. MAY TAKE ONE MORE TABLET BEFORE FOR ALLERGY SHOTS ONCE A WEEK (30 DAY supply) 01/06/19   Terald Sleeper, PA-C  aspirin 81 MG chewable tablet Chew 81 mg by mouth daily.    [provider]  AUVI-Q 0.3 MG/0.3ML SOAJ injection Use as directed for life-threatening allergic reaction. 3/32/95   Delora Fuel, MD  baclofen (LIORESAL) 10 MG tablet Take 10 mg by mouth 3 (three) times daily as needed. 05/14/18   [provider]  budesonide-formoterol (SYMBICORT) 80-4.5 MCG/ACT inhaler Inhale 1-2 puffs into the lungs daily. 11/20/18   Kozlow, Donnamarie Poag, MD  cetirizine (ZYRTEC) 10 MG tablet Take 1 tablet (10 mg total)  by mouth 2 (two) times daily. 07/02/18   Kozlow, Donnamarie Poag, MD  cromolyn (OPTICROM) 4 % ophthalmic solution Place 1 drop into both eyes 4 (four) times daily.  03/20/17   [provider]  cycloSPORINE (RESTASIS) 0.05 % ophthalmic emulsion Place 1 drop into both eyes 2 (two) times daily.    [provider]  EPIDUO 0.1-2.5 % gel AFFECTED AREA(S) ON FACE AND CHEST EVERY NIGHT AT BEDTIME 01/06/19   Terald Sleeper, PA-C  fluticasone [facility])  50 MCG/ACT nasal spray 1-2 sprays in each nostril 3 times weekly as needed. 10/27/18   Kozlow, Donnamarie Poag, MD  furosemide (LASIX) 20 MG tablet TAKE 1 TABLET BY MOUTH DAILY AS NEEDED FOR FLUID 01/06/19   Terald Sleeper, PA-C  HYDROcodone-acetaminophen (NORCO) 7.5-325 MG tablet Take 1 tablet by mouth 3 (three) times daily as needed for moderate pain.    [provider]  HYDROcodone-acetaminophen (NORCO/VICODIN) 5-325 MG tablet Take 1 tablet by mouth 3 (three) times daily as needed for moderate pain or severe pain. 01/12/19   Terald Sleeper, PA-C  IRON PO Take 1 tablet by mouth daily as needed (for low hemoglobin levels). Take when hemoglobin is low 09/11/17   [provider]  iron polysaccharides (IFEREX 150) 150 MG capsule Take 1 capsule (150 mg total) by mouth 2 (two) times daily. 01/12/19   Terald Sleeper, PA-C  linaclotide [facility]) 145 MCG CAPS capsule Take 1 capsule (145 mcg total) by mouth 2 (two) times daily. 01/12/19   Terald Sleeper, PA-C  losartan (COZAAR) 25 MG tablet Take 1 tablet (25 mg total) by mouth 2 (two) times daily. 01/06/19   Terald Sleeper, PA-C  metFORMIN (GLUCOPHAGE) 500 MG tablet TAKE 1 TABLET BY MOUTH EVERY DAY WITH BREAKFAST 10/24/18   Eustaquio Maize, MD  montelukast (SINGULAIR) 10 MG tablet Take 1 tablet (10 mg total) by mouth at bedtime. 10/27/18   Kozlow, Donnamarie Poag, MD  Multiple Vitamins-Minerals (SM COMPLETE ADVANCED FORMULA) TABS Take by mouth. 09/11/17   [provider]  Olopatadine HCl 0.6 % SOLN USE TWO  SPRAYS IN BOTH NOSTRILS TWICE DAILY 12/31/18   Kozlow, Donnamarie Poag, MD  pantoprazole (PROTONIX) 40 MG tablet Take 1 tablet (40 mg total) by mouth daily. 01/06/19   Terald Sleeper, PA-C  potassium chloride SA (K-DUR,KLOR-CON) 20 MEQ tablet TAKE 1 TABLET BY MOUTH DAILY WHILE TAKING LASIX 12/04/18   Eustaquio Maize, MD  pravastatin (PRAVACHOL) 20 MG tablet Take 1 tablet (20 mg total) by mouth daily. 01/06/19   Terald Sleeper, PA-C  VANIQA 13.9 % cream APPLY TO AFFECTED AREA TWICE DAILY 01/06/19   Terald Sleeper, PA-C  VOLTAREN 1 % GEL APPLY TWO GRAMS TOPICALLY FOUR TIMES DAILY 08/24/18   Claretta Fraise, MD    Family History Family History  Problem Relation Age of Onset  . Hypertension Mother   . Hypertension Father     Social History Social History   Tobacco Use  . Smoking status: Former Smoker    Packs/day: 0.25    Years: 5.00    Pack years: 1.25    Types: Cigarettes    Last attempt to quit: 11/18/1989    Years since quitting: 29.1  . Smokeless tobacco: Never Used  Substance Use Topics  . Alcohol use: No  . Drug use: No     Allergies   Demerol [meperidine]; Morphine and related; Other; Cardizem  [diltiazem hcl]; Diltiazem hcl; Lisinopril; Lyrica [pregabalin]; and Tramadol   Review of Systems Review of Systems  Constitutional: Positive for fatigue. Negative for activity change, appetite change and fever.  HENT: Negative for congestion.   Respiratory: Positive for chest tightness and shortness of breath.   Cardiovascular: Positive for chest pain.  Gastrointestinal: Negative for abdominal pain, nausea and vomiting.  Genitourinary: Negative for dysuria and hematuria.  Musculoskeletal: Negative for arthralgias and myalgias.  Neurological: Positive for dizziness, weakness and light-headedness.   all other systems are negative except as noted in the HPI  and PMH.     Physical Exam Updated Vital Signs BP 134/66 (BP Location: Left Arm)   Pulse 78   Temp 98 F (36.7 C) (Oral)   Resp  14   SpO2 99%   Physical Exam Vitals signs and nursing note reviewed.  Constitutional:      General: She is not in acute distress.    Appearance: She is well-developed.     Comments: Anxious appearing  HENT:     Head: Normocephalic and atraumatic.     Comments: Pale conjunctiva    Mouth/Throat:     Pharynx: No oropharyngeal exudate.  Eyes:     Conjunctiva/sclera: Conjunctivae normal.     Pupils: Pupils are equal, round, and reactive to light.  Neck:     Musculoskeletal: Normal range of motion and neck supple.     Comments: No meningismus. Cardiovascular:     Rate and Rhythm: Normal rate and regular rhythm.     Heart sounds: Normal heart sounds. No murmur.  Pulmonary:     Effort: Pulmonary effort is normal. No respiratory distress.     Breath sounds: Normal breath sounds.  Abdominal:     Palpations: Abdomen is soft.     Tenderness: There is no abdominal tenderness. There is no guarding or rebound.  Genitourinary:    Comments: Chaperone present.  Patient does have some blood near her labia and rectum.  Dark blood in vaginal vault.  Cervix appears to be fingertip.  There is some oozing of blood.  No CMT or lateralize adnexal tenderness Musculoskeletal: Normal range of motion.        General: No tenderness.  Skin:    General: Skin is warm.     Capillary Refill: Capillary refill takes less than 2 seconds.  Neurological:     General: No focal deficit present.     Mental Status: She is alert and oriented to person, place, and time. Mental status is at baseline.     Cranial Nerves: No cranial nerve deficit.     Motor: No abnormal muscle tone.     Coordination: Coordination normal.     Comments: No ataxia on finger to nose bilaterally. No pronator drift. 5/5 strength throughout. CN 2-12 intact.Equal grip strength. Sensation intact.   Psychiatric:        Behavior: Behavior normal.      ED Treatments / Results  Labs (all labs ordered are listed, but only abnormal results are  displayed) Labs Reviewed  WET PREP, GENITAL - Abnormal; Notable for the following components:      Result Value   WBC, Wet Prep HPF POC FEW (*)    All other components within normal limits  CBC WITH DIFFERENTIAL/PLATELET - Abnormal; Notable for the following components:   Hemoglobin 8.2 (*)    HCT 28.6 (*)    MCV 73.7 (*)    MCH 21.1 (*)    MCHC 28.7 (*)    RDW 19.2 (*)    All other components within normal limits  COMPREHENSIVE METABOLIC PANEL - Abnormal; Notable for the following components:   Potassium 2.6 (*)    Calcium 8.3 (*)    All other components within normal limits  IRON AND TIBC - Abnormal; Notable for the following components:   Iron 18 (*)    Saturation Ratios 4 (*)    All other components within normal limits  FERRITIN - Abnormal; Notable for the following components:   Ferritin 4 (*)    All other components within normal  limits  RETICULOCYTES - Abnormal; Notable for the following components:   Immature Retic Fract 18.7 (*)    All other components within normal limits  MAGNESIUM - Abnormal; Notable for the following components:   Magnesium 1.6 (*)    All other components within normal limits  TROPONIN I  PROTIME-INR  VITAMIN B12  FOLATE  BASIC METABOLIC PANEL  CBC  POC OCCULT BLOOD, ED  TYPE AND SCREEN  GC/CHLAMYDIA PROBE AMP ([place]) NOT AT [facility]    EKG EKG Interpretation  Date/Time:  Wednesday January 13 2019 00:02:27 EST Ventricular Rate:  75 PR Interval:    QRS Duration: 93 QT Interval:  395 QTC Calculation: 442 R Axis:   72 Text Interpretation:  Sinus rhythm No significant change was found Confirmed by Ezequiel Essex [phone]) on 01/13/2019 12:50:24 AM   Radiology Ct Angio Chest Pe W And/or Wo Contrast  Result Date: 01/13/2019 CLINICAL DATA:  Weakness and shortness of breath. Anemia. Chest tightness. Elevated D-dimer. EXAM: CT ANGIOGRAPHY CHEST WITH CONTRAST TECHNIQUE: Multidetector CT imaging of the chest was performed using the  standard protocol during bolus administration of intravenous contrast. Multiplanar CT image reconstructions and MIPs were obtained to evaluate the vascular anatomy. CONTRAST:  175mL ISOVUE-370 IOPAMIDOL (ISOVUE-370) INJECTION 76% COMPARISON:  07/08/2017 FINDINGS: Cardiovascular: Good opacification of the central and segmental pulmonary arteries. No focal filling defects. No evidence of significant pulmonary embolus. Normal caliber thoracic aorta. No aortic dissection. Great vessel origins are patent. Normal heart size. No pericardial effusions. Mediastinum/Nodes: Esophagus is decompressed. No significant lymphadenopathy in the chest. Lungs/Pleura: Nodular scarring in the anterior left lung base, unchanged since prior study. Lungs are otherwise clear and expanded. No pleural effusions. No pneumothorax. Airways are patent. Upper Abdomen: No acute abnormalities. Musculoskeletal: No chest wall abnormality. No acute or significant osseous findings. Review of the MIP images confirms the above findings. IMPRESSION: No evidence of significant pulmonary embolus. No evidence of active pulmonary disease. Electronically Signed   By: Lucienne Capers M.D.   On: 01/13/2019 02:25    Procedures Procedures (including critical care time)  Medications Ordered in ED Medications - No data to display   Initial Impression / Assessment and Plan / ED Course  I have reviewed the triage vital signs and the nursing notes.  Pertinent labs & imaging results that were available during my care of the patient were reviewed by me and considered in my medical decision making (see chart for details).       Patient with shortness of breath and chest tightness and concern for anemia.  Her EKG is nonischemic sinus rhythm.  Stacks increased by heart rate 73-93.  Patient given IV fluids.  Hemoglobin 8.2 which is somewhat stable.  Patient does have some bleeding on speculum exam.  Potassium 2.6 without acute ST changes on  EKG.  Suspect her hemoglobin continued to decline.  With her hypokalemia and anemia plan observation admission.   will hold blood transfusion at this time.  Work-up consistent with iron deficiency anemia.  Troponin is negative.  CT PE study is negative. Pelvic exam performed as above.  Discussed with patient that she will need admission for her severe hypokalemia as well as symptomatic anemia.  Suspect her chest tightness and shortness of breath are due to her anemia and suspect that her hemoglobin will continue to trend down.  Vitals remained stable in the ED.  Observation admission for hypokalemia and symptomatic anemia discussed with Dr. Olevia Bowens.  CRITICAL CARE Performed by: Ezequiel Essex Total critical  care time: 34 minutes Critical care time was exclusive of separately billable procedures and treating other patients. Critical care was necessary to treat or prevent imminent or life-threatening deterioration. Critical care was time spent personally by me on the following activities: development of treatment plan with patient and/or surrogate as well as nursing, discussions with consultants, evaluation of patient's response to treatment, examination of patient, obtaining history from patient or surrogate, ordering and performing treatments and interventions, ordering and review of laboratory studies, ordering and review of radiographic studies, pulse oximetry and re-evaluation of patient's condition.   Final Clinical Impressions(s) / ED Diagnoses   Final diagnoses:  Symptomatic anemia  Hypokalemia    ED Discharge Orders    None       , Annie Main, MD 01/13/19 [phone]

## 2019-01-13 NOTE — Progress Notes (Signed)
Patient has orders for Blood transfusion, however patient has requested a CT abdomen. Per Radiology Patient has to be NPO for approximately 4 hours before procedure can be performed and patient cannot have blood administration during CT. Blood administration have been held pending CT scan and MD has been made aware. Will transfuse blood post CT scan.

## 2019-01-13 NOTE — ED Notes (Signed)
Pt up to bathroom and back to bed.

## 2019-01-13 NOTE — ED Notes (Signed)
Pt eating breakfast. Will administer iron when she finishes.

## 2019-01-13 NOTE — ED Notes (Signed)
CRITICAL VALUE ALERT  Critical Value:  Potassium 2.6  Date & Time Notied:  01/13/2019 0121  Provider Notified: Dr. Wyvonnia Dusky  Orders Received/Actions taken: see chart

## 2019-01-14 ENCOUNTER — Telehealth: Payer: Self-pay | Admitting: Physician Assistant

## 2019-01-14 ENCOUNTER — Ambulatory Visit: Payer: Medicaid Other

## 2019-01-14 ENCOUNTER — Encounter: Payer: Self-pay | Admitting: Gastroenterology

## 2019-01-14 DIAGNOSIS — D62 Acute posthemorrhagic anemia: Secondary | ICD-10-CM | POA: Diagnosis not present

## 2019-01-14 DIAGNOSIS — D649 Anemia, unspecified: Secondary | ICD-10-CM | POA: Diagnosis not present

## 2019-01-14 DIAGNOSIS — I10 Essential (primary) hypertension: Secondary | ICD-10-CM | POA: Diagnosis not present

## 2019-01-14 DIAGNOSIS — E876 Hypokalemia: Secondary | ICD-10-CM | POA: Diagnosis not present

## 2019-01-14 LAB — BASIC METABOLIC PANEL
Anion gap: 6 (ref 5–15)
BUN: 5 mg/dL — ABNORMAL LOW (ref 6–20)
CO2: 24 mmol/L (ref 22–32)
Calcium: 8.8 mg/dL — ABNORMAL LOW (ref 8.9–10.3)
Chloride: 109 mmol/L (ref 98–111)
Creatinine, Ser: 0.56 mg/dL (ref 0.44–1.00)
GFR calc Af Amer: >60 mL/min (ref >60)
GFR calc non Af Amer: >60 mL/min (ref >60)
Glucose, Bld: 88 mg/dL (ref 70–99)
Potassium: 3.5 mmol/L (ref 3.5–5.1)
Sodium: 139 mmol/L (ref 135–145)

## 2019-01-14 LAB — BPAM RBC
Blood Product Expiration Date: 202003192359
Blood Product Expiration Date: 202003192359
ISSUE DATE / TIME: 202002261718
ISSUE DATE / TIME: 202002262253
Unit Type and Rh: 5100
Unit Type and Rh: 5100

## 2019-01-14 LAB — CBC
HCT: 33.8 % — ABNORMAL LOW (ref 36.0–46.0)
Hemoglobin: 10.1 g/dL — ABNORMAL LOW (ref 12.0–15.0)
MCH: 22.7 pg — ABNORMAL LOW (ref 26.0–34.0)
MCHC: 29.9 g/dL — ABNORMAL LOW (ref 30.0–36.0)
MCV: 76 fL — ABNORMAL LOW (ref 80.0–100.0)
Platelets: 263 10*3/uL (ref 150–400)
RBC: 4.45 MIL/uL (ref 3.87–5.11)
RDW: 19.4 % — ABNORMAL HIGH (ref 11.5–15.5)
WBC: 3.6 10*3/uL — ABNORMAL LOW (ref 4.0–10.5)
nRBC: 0 % (ref 0.0–0.2)

## 2019-01-14 LAB — TYPE AND SCREEN
ABO/RH(D): O POS
Antibody Screen: NEGATIVE
Unit division: 0
Unit division: 0

## 2019-01-14 LAB — MAGNESIUM: Magnesium: 2.2 mg/dL (ref 1.7–2.4)

## 2019-01-14 LAB — GC/CHLAMYDIA PROBE AMP (~~LOC~~) NOT AT ARMC
Chlamydia: NEGATIVE
Neisseria Gonorrhea: NEGATIVE

## 2019-01-14 NOTE — Progress Notes (Signed)
Removed IV-clean, dry, intact. Patient asked me to ask dr. Carles Mullins if her Xanax could be increased to TID instead of BID. Dr. Carles Mullins said that he would not increase to TID. I relayed to patient. Reviewed d/c paperwork with patient. Answered all questions and reviewed new medications. Wheeled stable patient to main entrance where she was picked up by her significant other.

## 2019-01-14 NOTE — Telephone Encounter (Signed)
I know

## 2019-01-14 NOTE — Discharge Summary (Signed)
Physician Discharge Summary  Jennifer Mullins HKV:[id number] DOB: 1971-10-20 DOA: 01/12/2019  PCP: Terald Sleeper, PA-C  Admit date: 01/12/2019 Discharge date: 01/14/2019  Admitted From: Home Disposition:  Home  Recommendations for Outpatient Follow-up:  1. Follow up with PCP in 1-2 weeks 2. CBC in one week 3. Follow up with OB/GYN on 01/15/19 at 10AM    Discharge Condition: Stable CODE STATUS: FULL Diet recommendation: Heart Healthy   Brief/Interim Summary: 48 y.o. female with medical history of menorrhagia, hypertension, iron deficiency anemia, COPD/asthma, fibromyalgia, toxic multinodular goiter presenting with chest tightness and shortness of breath while going to the bathroom on 01/12/2019.  She stated that the discomfort radiated to her bilateral shoulders.  She also has complained of some dizziness with standing.  The patient stated that she had a D&C performed on 12/15/2018 for menorrhagia.  She stated that she had sexual intercourse on 01/10/2019 after which she began having some spotting.  Since then, she has had vaginal bleeding with clots.  She states that she has not checked if she has had any hematochezia or melena.  She has began having some lower abdominal cramping.  She denies any fevers, chills, vomiting, diarrhea, hematuria.  She has an appointment to see her OB/GYN on 01/15/2019.  Upon presentation, the patient was noted to have hemoglobin of 8.2.  She had hemoglobin of 8.3 on 01/06/2019.  The patient states that she has not been compliant with her iron supplementation at home.  She only took it for 1 week after her D&C.  Discharge Diagnoses:  Symptomatic anemia/acute blood loss anemia -Secondary to menorrhagia -Case discussed with the patient's OB/GYN, Dr. Evie Lacks -Pelvic ultrasound--to uterine fibroids in the upper uterine segment.  No other abnormalities noted -01/13/2019 CT abdomen and pelvis--heterogenous/indistinct cervix.  Anterior uterine fibroid.  Endometrium not  significantly thickened -Transfused 2 units PRBC -Infuse IV iron -Baseline hemoglobin 10-11 -Presented with hemoglobin 8.2 -Hemoglobin 10.1 on the day of discharge -Follow-up with OB/GYN 01/15/2019 at 10 AM  Iron deficiency anemia -Secondary to menorrhagia -Transfuse Feraheme x1 -Restart Niferex -Iron saturation 4%, ferritin 4  Chest discomfort -CT angiogram chest negative for pulmonary embolus -Cycle troponins--neg x 3 -Personally reviewed EKG--sinus rhythm without ST-T wave changes -01/13/2019 echo EF 55-60%, no WMA, normal RV  COPD/asthma -Stable on room air presently -Continue Symbicort  Essential hypertension -Continue losartan  Menorrhagia -Patient has follow-up appointment with her OB/GYN on 01/15/2019 -Hold off on megestrol for now given the patient's history of pulmonary embolus--defer to GYN  Hyperlipidemia -Continue statin  Hypokalemia/hypomagnesemia -replete -mag 1.6>> 2.2   Discharge Instructions   Allergies as of 01/14/2019      Reactions   Demerol [meperidine] Shortness Of Breath   FLUSHING AND SHORTNESS OF BREATH   Morphine And Related Shortness Of Breath   Flushed and hot hyper Flushed and hot hyper Flushed and hot hyper Flushed and hot hyper   Other Shortness Of Breath   Flushed and hot hyper Flushed and hot hyper Flushed and hot hyper Flushed and hot hyper Flushed and hot hyper   Cardizem  [diltiazem Hcl] Other (See Comments)   Diltiazem Hcl Hives   Lisinopril    Caused cough   Lyrica [pregabalin]    Chest pain, elevated heart rate   Tramadol Other (See Comments)   Tears up stomach      Medication List    TAKE these medications   albuterol (2.5 MG/3ML) 0.083% nebulizer solution Commonly known as:  PROVENTIL Take 3 mLs (2.5 mg total) by  nebulization every 6 (six) hours as needed for wheezing or shortness of breath.   albuterol 108 (90 Base) MCG/ACT inhaler Commonly known as:  PROVENTIL HFA;VENTOLIN HFA Inhale 2 puffs  into the lungs every 6 (six) hours as needed for wheezing or shortness of breath.   ALPRAZolam 0.5 MG tablet Commonly known as:  XANAX TAKE 1 TABLET BY MOUTH TWICE DAILY AS NEEDED. MAY TAKE ONE MORE TABLET BEFORE FOR ALLERGY SHOTS ONCE A WEEK (30 DAY supply)   aspirin 81 MG chewable tablet Chew 81 mg by mouth daily.   AUVI-Q 0.3 mg/0.3 mL Soaj injection Generic drug:  EPINEPHrine Use as directed for life-threatening allergic reaction.   baclofen 10 MG tablet Commonly known as:  LIORESAL Take 10 mg by mouth 3 (three) times daily as needed.   budesonide-formoterol 80-4.5 MCG/ACT inhaler Commonly known as:  SYMBICORT Inhale 1-2 puffs into the lungs daily.   cetirizine 10 MG tablet Commonly known as:  ZYRTEC Take 1 tablet (10 mg total) by mouth 2 (two) times daily.   cromolyn 4 % ophthalmic solution Commonly known as:  OPTICROM Place 1 drop into both eyes 4 (four) times daily.   cycloSPORINE 0.05 % ophthalmic emulsion Commonly known as:  RESTASIS Place 1 drop into both eyes 2 (two) times daily.   EPIDUO 0.1-2.5 % gel Generic drug:  Adapalene-Benzoyl Peroxide AFFECTED AREA(S) ON FACE AND CHEST EVERY NIGHT AT BEDTIME   fluticasone 50 MCG/ACT nasal spray Commonly known as:  FLONASE 1-2 sprays in each nostril 3 times weekly as needed.   furosemide 20 MG tablet Commonly known as:  LASIX TAKE 1 TABLET BY MOUTH DAILY AS NEEDED FOR FLUID   HYDROcodone-acetaminophen 5-325 MG tablet Commonly known as:  NORCO Take 1 tablet by mouth every 8 (eight) hours as needed for moderate pain or severe pain.   iron polysaccharides 150 MG capsule Commonly known as:  IFEREX 150 Take 1 capsule (150 mg total) by mouth 2 (two) times daily.   linaclotide 145 MCG Caps capsule Commonly known as:  LINZESS Take 1 capsule (145 mcg total) by mouth 2 (two) times daily.   losartan 25 MG tablet Commonly known as:  COZAAR Take 1 tablet (25 mg total) by mouth 2 (two) times daily.   metFORMIN 500 MG  tablet Commonly known as:  GLUCOPHAGE TAKE 1 TABLET BY MOUTH EVERY DAY WITH BREAKFAST   montelukast 10 MG tablet Commonly known as:  SINGULAIR Take 1 tablet (10 mg total) by mouth at bedtime.   Olopatadine HCl 0.6 % Soln USE TWO SPRAYS IN BOTH NOSTRILS TWICE DAILY   pantoprazole 40 MG tablet Commonly known as:  PROTONIX Take 1 tablet (40 mg total) by mouth daily. What changed:  when to take this   potassium chloride SA 20 MEQ tablet Commonly known as:  K-DUR,KLOR-CON TAKE 1 TABLET BY MOUTH DAILY WHILE TAKING LASIX   pravastatin 20 MG tablet Commonly known as:  PRAVACHOL Take 1 tablet (20 mg total) by mouth daily. What changed:  when to take this   SM COMPLETE ADVANCED FORMULA Tabs Take 1 tablet by mouth daily.   VANIQA 13.9 % cream Generic drug:  Eflornithine HCl APPLY TO AFFECTED AREA TWICE DAILY   VOLTAREN 1 % Gel Generic drug:  diclofenac sodium APPLY TWO GRAMS TOPICALLY FOUR TIMES DAILY       Allergies  Allergen Reactions  . Demerol [Meperidine] Shortness Of Breath    FLUSHING AND SHORTNESS OF BREATH  . Morphine And Related Shortness Of Breath  Flushed and hot hyper Flushed and hot hyper Flushed and hot hyper Flushed and hot hyper  . Other Shortness Of Breath    Flushed and hot hyper Flushed and hot hyper Flushed and hot hyper Flushed and hot hyper Flushed and hot hyper  . Cardizem  [Diltiazem Hcl] Other (See Comments)  . Diltiazem Hcl Hives  . Lisinopril     Caused cough  . Lyrica [Pregabalin]     Chest pain, elevated heart rate  . Tramadol Other (See Comments)    Tears up stomach    Consultations:  none   Procedures/Studies: Ct Angio Chest Pe W And/or Wo Contrast  Result Date: 01/13/2019 CLINICAL DATA:  Weakness and shortness of breath. Anemia. Chest tightness. Elevated D-dimer. EXAM: CT ANGIOGRAPHY CHEST WITH CONTRAST TECHNIQUE: Multidetector CT imaging of the chest was performed using the standard protocol during bolus administration  of intravenous contrast. Multiplanar CT image reconstructions and MIPs were obtained to evaluate the vascular anatomy. CONTRAST:  136mL ISOVUE-370 IOPAMIDOL (ISOVUE-370) INJECTION 76% COMPARISON:  07/08/2017 FINDINGS: Cardiovascular: Good opacification of the central and segmental pulmonary arteries. No focal filling defects. No evidence of significant pulmonary embolus. Normal caliber thoracic aorta. No aortic dissection. Great vessel origins are patent. Normal heart size. No pericardial effusions. Mediastinum/Nodes: Esophagus is decompressed. No significant lymphadenopathy in the chest. Lungs/Pleura: Nodular scarring in the anterior left lung base, unchanged since prior study. Lungs are otherwise clear and expanded. No pleural effusions. No pneumothorax. Airways are patent. Upper Abdomen: No acute abnormalities. Musculoskeletal: No chest wall abnormality. No acute or significant osseous findings. Review of the MIP images confirms the above findings. IMPRESSION: No evidence of significant pulmonary embolus. No evidence of active pulmonary disease. Electronically Signed   By: Lucienne Capers M.D.   On: 01/13/2019 02:25   Ct Abdomen Pelvis W Contrast  Result Date: 01/13/2019 CLINICAL DATA:  Pelvic pain starting on 01/12/2019. Vaginal bleeding. Abdominal distention. Prior [facility] on 12/15/2018. EXAM: CT ABDOMEN AND PELVIS WITH CONTRAST TECHNIQUE: Multidetector CT imaging of the abdomen and pelvis was performed using the standard protocol following bolus administration of intravenous contrast. CONTRAST:  73mL ISOVUE-300 IOPAMIDOL (ISOVUE-300) INJECTION 61%, 118mL OMNIPAQUE IOHEXOL 300 MG/ML SOLN COMPARISON:  Chest CT 01/13/2019.  Pelvic ultrasound 01/06/2018 FINDINGS: Lower chest: Stable scarring or atelectasis in the lingula. Hepatobiliary: 5 mm hypodense lesion posteriorly in the right hepatic lobe on image 27/2, technically too small to characterize although statistically likely to be benign. Gallbladder  unremarkable. No appreciable biliary dilatation. Pancreas: Unremarkable Spleen: Unremarkable Adrenals/Urinary Tract: A 1.2 by 1.6 cm left adrenal mass has a relative washout of 72%, consistent with adrenal adenoma. Fluid density 6 mm lesion of the right kidney upper pole is likely a cyst although technically too small to characterize. Otherwise unremarkable. Stomach/Bowel: Unremarkable Vascular/Lymphatic: Unremarkable Reproductive: The endometrium does not appear particularly thickened, at about 5 mm. Heterogeneity and masslike appearance of the anterior uterine body suspicious for a 4.5 cm in diameter fibroid. There is heterogeneity and mild prominence of the cervix, and inflammation in this vicinity is not excluded. The ovaries appear fairly unremarkable. Other: No supplemental non-categorized findings. Musculoskeletal: Grade 1 degenerative retrolisthesis at L4-5 with moderate right and mild left foraminal impingement at the L4-5 level due to disc bulge and facet arthropathy. There is also a disc bulge at L3-4. IMPRESSION: 1. Heterogeneous and indistinct cervix, inflammation of the cervix is not excluded. 2. Suspected anterior uterine body fibroid. 3. Endometrium is not currently significantly thickened in the uterine body, although the cervical endometrium  is less certain. 4. Left adrenal adenoma. 5. Impingement at L4-5 due to spondylosis and degenerative disc disease. Electronically Signed   By: Van Clines M.D.   On: 01/13/2019 18:29   US Pelvic Complete With Transvaginal  Result Date: 01/13/2019 CLINICAL DATA:  Menorrhagia. EXAM: TRANSABDOMINAL AND TRANSVAGINAL ULTRASOUND OF PELVIS TECHNIQUE: Both transabdominal and transvaginal ultrasound examinations of the pelvis were performed. Transabdominal technique was performed for global imaging of the pelvis including uterus, ovaries, adnexal regions, and pelvic cul-de-sac. It was necessary to proceed with endovaginal exam following the transabdominal  exam to visualize the endometrium. COMPARISON:  None FINDINGS: Uterus Measurements: 11.9 x 5.4 x 4.1 cm = volume: 137.4 mL. Two fibroids are identified. Both are in the fundus. The larger is more anterior measuring 3.1 x 3.2 x 2.7 cm. Second fibroid which is more posterior to the right measures 2.3 x 2.9 x 2.7 cm. The fibroids appear to be subendometrial. Endometrium Thickness: 0.3 cm.  No focal abnormality visualized. Right ovary Measurements: 2.1 x 2.2 x 2.1 cm = volume: 5.0 mL. Normal appearance/no adnexal mass. Left ovary Measurements: 4.0 x 2.2 x 2.0 cm = volume: 9.1 mL. Normal appearance/no adnexal mass. Other findings No abnormal free fluid. IMPRESSION: Two uterine fibroids are identified measuring 3.2 and 2.9 cm. Both fibroids are in the upper uterine segment and fundus and appear to abut the endometrial canal. The exam is otherwise negative. Electronically Signed   By: Inge Rise M.D.   On: 01/13/2019 10:14         Discharge Exam: Vitals:   01/14/19 0651 01/14/19 0754  BP: 125/79   Pulse: 72   Resp: 17   Temp: 98.2 F (36.8 C)   SpO2: 100% 98%   Vitals:   01/13/19 2315 01/14/19 0139 01/14/19 0651 01/14/19 0754  BP: 114/62 129/71 125/79   Pulse: 89 81 72   Resp: 18 17 17    Temp: 98.8 F (37.1 C) 97.7 F (36.5 C) 98.2 F (36.8 C)   TempSrc: Oral Oral Oral   SpO2: 99% 100% 100% 98%    General: Pt is alert, awake, not in acute distress Cardiovascular: RRR, S1/S2 +, no rubs, no gallops Respiratory: CTA bilaterally, no wheezing, no rhonchi Abdominal: Soft, NT, ND, bowel sounds + Extremities: no edema, no cyanosis   The results of significant diagnostics from this hospitalization (including imaging, microbiology, ancillary and laboratory) are listed below for reference.    Significant Diagnostic Studies: Ct Angio Chest Pe W And/or Wo Contrast  Result Date: 01/13/2019 CLINICAL DATA:  Weakness and shortness of breath. Anemia. Chest tightness. Elevated D-dimer. EXAM:  CT ANGIOGRAPHY CHEST WITH CONTRAST TECHNIQUE: Multidetector CT imaging of the chest was performed using the standard protocol during bolus administration of intravenous contrast. Multiplanar CT image reconstructions and MIPs were obtained to evaluate the vascular anatomy. CONTRAST:  146mL ISOVUE-370 IOPAMIDOL (ISOVUE-370) INJECTION 76% COMPARISON:  07/08/2017 FINDINGS: Cardiovascular: Good opacification of the central and segmental pulmonary arteries. No focal filling defects. No evidence of significant pulmonary embolus. Normal caliber thoracic aorta. No aortic dissection. Great vessel origins are patent. Normal heart size. No pericardial effusions. Mediastinum/Nodes: Esophagus is decompressed. No significant lymphadenopathy in the chest. Lungs/Pleura: Nodular scarring in the anterior left lung base, unchanged since prior study. Lungs are otherwise clear and expanded. No pleural effusions. No pneumothorax. Airways are patent. Upper Abdomen: No acute abnormalities. Musculoskeletal: No chest wall abnormality. No acute or significant osseous findings. Review of the MIP images confirms the above findings. IMPRESSION: No evidence of  significant pulmonary embolus. No evidence of active pulmonary disease. Electronically Signed   By: Lucienne Capers M.D.   On: 01/13/2019 02:25   Ct Abdomen Pelvis W Contrast  Result Date: 01/13/2019 CLINICAL DATA:  Pelvic pain starting on 01/12/2019. Vaginal bleeding. Abdominal distention. Prior [facility] on 12/15/2018. EXAM: CT ABDOMEN AND PELVIS WITH CONTRAST TECHNIQUE: Multidetector CT imaging of the abdomen and pelvis was performed using the standard protocol following bolus administration of intravenous contrast. CONTRAST:  21mL ISOVUE-300 IOPAMIDOL (ISOVUE-300) INJECTION 61%, 136mL OMNIPAQUE IOHEXOL 300 MG/ML SOLN COMPARISON:  Chest CT 01/13/2019.  Pelvic ultrasound 01/06/2018 FINDINGS: Lower chest: Stable scarring or atelectasis in the lingula. Hepatobiliary: 5 mm hypodense lesion  posteriorly in the right hepatic lobe on image 27/2, technically too small to characterize although statistically likely to be benign. Gallbladder unremarkable. No appreciable biliary dilatation. Pancreas: Unremarkable Spleen: Unremarkable Adrenals/Urinary Tract: A 1.2 by 1.6 cm left adrenal mass has a relative washout of 72%, consistent with adrenal adenoma. Fluid density 6 mm lesion of the right kidney upper pole is likely a cyst although technically too small to characterize. Otherwise unremarkable. Stomach/Bowel: Unremarkable Vascular/Lymphatic: Unremarkable Reproductive: The endometrium does not appear particularly thickened, at about 5 mm. Heterogeneity and masslike appearance of the anterior uterine body suspicious for a 4.5 cm in diameter fibroid. There is heterogeneity and mild prominence of the cervix, and inflammation in this vicinity is not excluded. The ovaries appear fairly unremarkable. Other: No supplemental non-categorized findings. Musculoskeletal: Grade 1 degenerative retrolisthesis at L4-5 with moderate right and mild left foraminal impingement at the L4-5 level due to disc bulge and facet arthropathy. There is also a disc bulge at L3-4. IMPRESSION: 1. Heterogeneous and indistinct cervix, inflammation of the cervix is not excluded. 2. Suspected anterior uterine body fibroid. 3. Endometrium is not currently significantly thickened in the uterine body, although the cervical endometrium is less certain. 4. Left adrenal adenoma. 5. Impingement at L4-5 due to spondylosis and degenerative disc disease. Electronically Signed   By: Van Clines M.D.   On: 01/13/2019 18:29   US Pelvic Complete With Transvaginal  Result Date: 01/13/2019 CLINICAL DATA:  Menorrhagia. EXAM: TRANSABDOMINAL AND TRANSVAGINAL ULTRASOUND OF PELVIS TECHNIQUE: Both transabdominal and transvaginal ultrasound examinations of the pelvis were performed. Transabdominal technique was performed for global imaging of the pelvis  including uterus, ovaries, adnexal regions, and pelvic cul-de-sac. It was necessary to proceed with endovaginal exam following the transabdominal exam to visualize the endometrium. COMPARISON:  None FINDINGS: Uterus Measurements: 11.9 x 5.4 x 4.1 cm = volume: 137.4 mL. Two fibroids are identified. Both are in the fundus. The larger is more anterior measuring 3.1 x 3.2 x 2.7 cm. Second fibroid which is more posterior to the right measures 2.3 x 2.9 x 2.7 cm. The fibroids appear to be subendometrial. Endometrium Thickness: 0.3 cm.  No focal abnormality visualized. Right ovary Measurements: 2.1 x 2.2 x 2.1 cm = volume: 5.0 mL. Normal appearance/no adnexal mass. Left ovary Measurements: 4.0 x 2.2 x 2.0 cm = volume: 9.1 mL. Normal appearance/no adnexal mass. Other findings No abnormal free fluid. IMPRESSION: Two uterine fibroids are identified measuring 3.2 and 2.9 cm. Both fibroids are in the upper uterine segment and fundus and appear to abut the endometrial canal. The exam is otherwise negative. Electronically Signed   By: Inge Rise M.D.   On: 01/13/2019 10:14     Microbiology: Recent Results (from the past 240 hour(s))  Wet prep, genital     Status: Abnormal   Collection Time:  01/13/19  2:22 AM  Result Value Ref Range Status   Yeast Wet Prep HPF POC NONE SEEN NONE SEEN Final   Trich, Wet Prep NONE SEEN NONE SEEN Final   Clue Cells Wet Prep HPF POC NONE SEEN NONE SEEN Final   WBC, Wet Prep HPF POC FEW (A) NONE SEEN Final   Sperm NONE SEEN  Final    Comment: Performed at [facility], [address] [place], [place] [address]     Labs: Basic Metabolic Panel: Recent Labs  Lab 01/13/19 0011 01/13/19 0605 01/14/19 0623  NA 137 141 139  K 2.6* 3.9 3.5  CL 104 113* 109  CO2 24 23 24   GLUCOSE 97 93 88  BUN 8 5* 5*  CREATININE 0.62 0.55 0.56  CALCIUM 8.3* 7.6* 8.8*  MG 1.6*  --  2.2   Liver Function Tests: Recent Labs  Lab 01/13/19 0011  AST 17  ALT 14  ALKPHOS 84  BILITOT 0.3    PROT 6.7  ALBUMIN 3.8   No results for input(s): LIPASE, AMYLASE in the last 168 hours. No results for input(s): AMMONIA in the last 168 hours. CBC: Recent Labs  Lab 01/13/19 0011 01/13/19 0605 01/14/19 0623  WBC 4.9 3.6* 3.6*  NEUTROABS 2.6  --   --   HGB 8.2* 7.3* 10.1*  HCT 28.6* 25.8* 33.8*  MCV 73.7* 75.0* 76.0*  PLT 277 218 263   Cardiac Enzymes: Recent Labs  Lab 01/13/19 0011 01/13/19 0605 01/13/19 1207  TROPONINI <0.03 <0.03 <0.03   BNP: Invalid input(s): POCBNP CBG: No results for input(s): GLUCAP in the last 168 hours.  Time coordinating discharge:  36 minutes  Signed:  Orson Eva, DO [facility] Pager: [phone] 01/14/2019, 8:56 AM

## 2019-01-15 ENCOUNTER — Telehealth: Payer: Self-pay | Admitting: Physician Assistant

## 2019-01-15 DIAGNOSIS — N938 Other specified abnormal uterine and vaginal bleeding: Secondary | ICD-10-CM | POA: Diagnosis not present

## 2019-01-15 LAB — HIV ANTIBODY (ROUTINE TESTING W REFLEX): HIV Screen 4th Generation wRfx: NONREACTIVE

## 2019-01-15 MED ORDER — LINACLOTIDE 145 MCG PO CAPS: 145.0000 ug | ORAL_CAPSULE | Freq: Two times a day (BID) | ORAL | 0 refills | Status: DC

## 2019-01-15 NOTE — Telephone Encounter (Signed)
In process with her insurance

## 2019-01-15 NOTE — Telephone Encounter (Signed)
Pt is wanting to know if we have samples for linzess that could last her 4-5 days until her medication can be approved.

## 2019-01-15 NOTE — Telephone Encounter (Signed)
2 bottles of Linzess 145 given to patient.

## 2019-01-18 ENCOUNTER — Other Ambulatory Visit: Payer: Self-pay | Admitting: Physician Assistant

## 2019-01-18 NOTE — Telephone Encounter (Signed)
She has Linzess, she should not be taking 2 of these medicines.  Linzess was sent to the pharmacy recently.

## 2019-01-19 ENCOUNTER — Telehealth: Payer: Self-pay | Admitting: *Deleted

## 2019-01-19 ENCOUNTER — Ambulatory Visit (INDEPENDENT_AMBULATORY_CARE_PROVIDER_SITE_OTHER): Payer: BLUE CROSS/BLUE SHIELD | Admitting: *Deleted

## 2019-01-19 ENCOUNTER — Other Ambulatory Visit: Payer: Self-pay | Admitting: Physician Assistant

## 2019-01-19 DIAGNOSIS — J309 Allergic rhinitis, unspecified: Secondary | ICD-10-CM | POA: Diagnosis not present

## 2019-01-19 MED ORDER — PLECANATIDE 3 MG PO TABS: 3.0000 mg | ORAL_TABLET | Freq: Every day | ORAL | 5 refills | Status: DC

## 2019-01-19 NOTE — Telephone Encounter (Signed)
Patient notified when she came in for allergy shot states pharmacy will ship out medication on Thursday or Friday

## 2019-01-19 NOTE — Telephone Encounter (Signed)
Has it paid for trulance in the past?

## 2019-01-19 NOTE — Telephone Encounter (Signed)
script is sent

## 2019-01-19 NOTE — Telephone Encounter (Signed)
Insurance denied Linzess  

## 2019-01-19 NOTE — Telephone Encounter (Signed)
Left message for patient to call the office. Per Tammy okay for patient to come into the office for a sample of Nucala. PA has been submitted but she has not received a response yet.

## 2019-01-19 NOTE — Telephone Encounter (Signed)
No.  Patient states that she has to show that she has failed other medications.  Patient also states that insurance company informed patient that Trulance should be covered

## 2019-01-21 ENCOUNTER — Telehealth: Payer: Self-pay | Admitting: *Deleted

## 2019-01-21 ENCOUNTER — Ambulatory Visit (INDEPENDENT_AMBULATORY_CARE_PROVIDER_SITE_OTHER): Payer: BLUE CROSS/BLUE SHIELD | Admitting: *Deleted

## 2019-01-21 DIAGNOSIS — Z6825 Body mass index (BMI) 25.0-25.9, adult: Secondary | ICD-10-CM | POA: Diagnosis not present

## 2019-01-21 DIAGNOSIS — J4550 Severe persistent asthma, uncomplicated: Secondary | ICD-10-CM | POA: Diagnosis not present

## 2019-01-21 DIAGNOSIS — N938 Other specified abnormal uterine and vaginal bleeding: Secondary | ICD-10-CM | POA: Diagnosis not present

## 2019-01-21 DIAGNOSIS — H1013 Acute atopic conjunctivitis, bilateral: Secondary | ICD-10-CM | POA: Diagnosis not present

## 2019-01-21 NOTE — Telephone Encounter (Signed)
I advised Jennifer Mullins to contact [place] [facility] to check to see if we have sample to give patient.  Unfortunately didn't know patient had change of insurance till last week when her Rx was getting ready to ship and pt advised pharmacy of ins. Change. I did look into chart and saw that she had given office new card but I was unaware.  Started her PA with her new ins last week

## 2019-01-21 NOTE — Telephone Encounter (Signed)
-----   Message from Herbie Drape, LPN sent at 03/24/5050  4:34 PM EST ----- Patient has called several times today upset because her Nucala isn't here and she can not get in touch with you. I did not see her on the shipment list. It looks like she had an appt on 01/14/2019 but her medication wasn't going to be shipped due to her insurance I believe. She wanted me to send you an email in regards to this matter, so I thought I would send a staff message just letting you know. Thank You.

## 2019-01-25 NOTE — Telephone Encounter (Signed)
Patient aware, per voice mail message,script is ready.

## 2019-01-29 ENCOUNTER — Other Ambulatory Visit: Payer: Self-pay

## 2019-01-29 ENCOUNTER — Encounter: Payer: Self-pay | Admitting: Physician Assistant

## 2019-01-29 ENCOUNTER — Ambulatory Visit: Payer: BLUE CROSS/BLUE SHIELD | Admitting: Physician Assistant

## 2019-01-29 VITALS — BP 127/81 | HR 77 | Temp 97.5°F | Ht 62.0 in | Wt 141.0 lb

## 2019-01-29 DIAGNOSIS — Z981 Arthrodesis status: Secondary | ICD-10-CM | POA: Diagnosis not present

## 2019-01-29 DIAGNOSIS — F411 Generalized anxiety disorder: Secondary | ICD-10-CM

## 2019-01-29 DIAGNOSIS — L680 Hirsutism: Secondary | ICD-10-CM | POA: Diagnosis not present

## 2019-01-29 DIAGNOSIS — L700 Acne vulgaris: Secondary | ICD-10-CM

## 2019-01-29 DIAGNOSIS — R635 Abnormal weight gain: Secondary | ICD-10-CM

## 2019-01-29 DIAGNOSIS — K625 Hemorrhage of anus and rectum: Secondary | ICD-10-CM | POA: Diagnosis not present

## 2019-01-29 MED ORDER — DIETHYLPROPION HCL ER 75 MG PO TB24: 75.0000 mg | ORAL_TABLET | Freq: Every day | ORAL | 2 refills | Status: DC

## 2019-01-29 MED ORDER — VANIQA 13.9 % EX CREA: TOPICAL_CREAM | CUTANEOUS | 5 refills | Status: DC

## 2019-01-29 MED ORDER — HYDROCODONE-ACETAMINOPHEN 5-325 MG PO TABS: 1.0000 | ORAL_TABLET | Freq: Four times a day (QID) | ORAL | 0 refills | Status: DC | PRN

## 2019-01-29 MED ORDER — ADAPALENE 0.1 % EX CREA: TOPICAL_CREAM | Freq: Every day | CUTANEOUS | 5 refills | Status: DC

## 2019-01-29 MED ORDER — BUSPIRONE HCL 10 MG PO TABS: 10.0000 mg | ORAL_TABLET | Freq: Three times a day (TID) | ORAL | 3 refills | Status: DC

## 2019-01-29 MED ORDER — HYDROCODONE-ACETAMINOPHEN 5-325 MG PO TABS: 1.0000 | ORAL_TABLET | Freq: Three times a day (TID) | ORAL | 0 refills | Status: DC | PRN

## 2019-01-29 NOTE — Progress Notes (Signed)
adapalen  Hair removal, nair hurt skin.    BP 127/81   Pulse 77   Temp (!) 97.5 F (36.4 C) (Oral)   Ht 5\' 2"  (1.575 m)   Wt 141 lb (64 kg)   SpO2 98%   BMI 25.79 kg/m    Subjective:    Patient ID: Jennifer Mullins, female    DOB: Jul 25, 1971, 48 y.o.   MRN: [id number]  HPI: Jennifer Mullins is a 48 y.o. female presenting on 01/29/2019 for Anemia (2 week rck )  Patient comes in for a follow-up from her hospitalization for anemia.  She will be going to gastroenterologist soon and having a colonoscopy performed.  She is also probably going to be having a hysterectomy due to her fibroids and menorrhagia.  Hirsutism: Vanacof needs to be refilled she has tried Carlton Adam, shaving, waxing and none of them daily well.  And she could not tolerate the nair  She is status post cervical spinal fusion and uses very little hydrocodone.  Refills will be sent today.  We will have urine drug screen performed.  Contract is on file.  She had impingement at L4-5 with spondylosis and again cervical neck fusion.  She has had some weight gain.  She used Contrave in the past and would like to get a prescription again to work on lowering her weight.  When she lowered her weight before her diabetes got completely controlled.  She does have recent rectal bleeding therefore going to the gastroenterologist.  She has longstanding generalized anxiety disorder and has a set amount that we have agreed on using.  Acne vulgaris.  The adapalene may be covered by her insurance and a prescription will be sent in.  Past Medical History:  Diagnosis Date  . Anxiety   . Arthritis   . Asthma   . Bilateral calcaneal spurs   . Bilateral polycystic ovarian syndrome   . Bulging lumbar disc   . Carpal tunnel syndrome, bilateral   . COPD (chronic obstructive pulmonary disease) (HCC)    BRONCHITIS  . Diabetes mellitus without complication ([place])    TYPE 2   DX  4-5 YRS AGO  . ETD (Eustachian tube dysfunction), bilateral    left  worse than right  . Fibromyalgia   . GAD (generalized anxiety disorder)   . GERD (gastroesophageal reflux disease)    TAKES PRESCRIPTION MEDS  . Glaucoma    BOTH EYES  . Hyperlipidemia   . Hypertension   . Hypothyroidism    NODULES ON THYROID  . PONV (postoperative nausea and vomiting)   . Pulmonary embolism ([place])   . Tachycardia   . TMJ (dislocation of temporomandibular joint)   . Trigger finger   . Trigger finger of left thumb    Relevant past medical, surgical, family and social history reviewed and updated as indicated. Interim medical history since our last visit reviewed. Allergies and medications reviewed and updated. DATA REVIEWED: CHART IN EPIC  Family History reviewed for pertinent findings.  Review of Systems  Constitutional: Positive for fatigue. Negative for fever.  HENT: Negative.   Eyes: Negative.   Respiratory: Negative.   Gastrointestinal: Negative.   Genitourinary: Negative.   Musculoskeletal: Positive for arthralgias, back pain, myalgias and neck pain.  Neurological: Negative.   Psychiatric/Behavioral: Positive for decreased concentration and dysphoric mood. The patient is nervous/anxious.     Allergies as of 01/29/2019      Reactions   Demerol [meperidine] Shortness Of Breath   FLUSHING AND SHORTNESS OF  BREATH   Morphine And Related Shortness Of Breath   Flushed and hot hyper Flushed and hot hyper Flushed and hot hyper Flushed and hot hyper   Other Shortness Of Breath   Flushed and hot hyper Flushed and hot hyper Flushed and hot hyper Flushed and hot hyper Flushed and hot hyper   Cardizem  [diltiazem Hcl] Other (See Comments)   Diltiazem Hcl Hives   Lisinopril    Caused cough   Lyrica [pregabalin]    Chest pain, elevated heart rate   Tramadol Other (See Comments)   Tears up stomach      Medication List       Accurate as of January 29, 2019 11:59 PM. Always use your most recent med list.        adapalene 0.1 % cream Commonly known  as:  Differin Apply topically at bedtime.   albuterol (2.5 MG/3ML) 0.083% nebulizer solution Commonly known as:  PROVENTIL Take 3 mLs (2.5 mg total) by nebulization every 6 (six) hours as needed for wheezing or shortness of breath.   albuterol 108 (90 Base) MCG/ACT inhaler Commonly known as:  PROVENTIL HFA;VENTOLIN HFA Inhale 2 puffs into the lungs every 6 (six) hours as needed for wheezing or shortness of breath.   ALPRAZolam 0.5 MG tablet Commonly known as:  XANAX TAKE 1 TABLET BY MOUTH TWICE DAILY AS NEEDED. MAY TAKE ONE MORE TABLET BEFORE FOR ALLERGY SHOTS ONCE A WEEK (30 DAY supply)   aspirin 81 MG chewable tablet Chew 81 mg by mouth daily.   Auvi-Q 0.3 mg/0.3 mL Soaj injection Generic drug:  EPINEPHrine Use as directed for life-threatening allergic reaction.   baclofen 10 MG tablet Commonly known as:  LIORESAL Take 10 mg by mouth 3 (three) times daily as needed.   budesonide-formoterol 80-4.5 MCG/ACT inhaler Commonly known as:  SYMBICORT Inhale 1-2 puffs into the lungs daily.   busPIRone 10 MG tablet Commonly known as:  BUSPAR Take 1 tablet (10 mg total) by mouth 3 (three) times daily.   cetirizine 10 MG tablet Commonly known as:  ZYRTEC Take 1 tablet (10 mg total) by mouth 2 (two) times daily.   cromolyn 4 % ophthalmic solution Commonly known as:  OPTICROM Place 1 drop into both eyes 4 (four) times daily.   cycloSPORINE 0.05 % ophthalmic emulsion Commonly known as:  RESTASIS Place 1 drop into both eyes 2 (two) times daily.   Diethylpropion HCl CR 75 MG Tb24 Take 1 tablet (75 mg total) by mouth daily.   Epiduo 0.1-2.5 % gel Generic drug:  Adapalene-Benzoyl Peroxide AFFECTED AREA(S) ON FACE AND CHEST EVERY NIGHT AT BEDTIME   fluticasone 50 MCG/ACT nasal spray Commonly known as:  FLONASE 1-2 sprays in each nostril 3 times weekly as needed.   furosemide 20 MG tablet Commonly known as:  LASIX TAKE 1 TABLET BY MOUTH DAILY AS NEEDED FOR FLUID    HYDROcodone-acetaminophen 5-325 MG tablet Commonly known as:  Norco Take 1 tablet by mouth every 8 (eight) hours as needed for moderate pain or severe pain.   HYDROcodone-acetaminophen 5-325 MG tablet Commonly known as:  Norco Take 1 tablet by mouth every 6 (six) hours as needed for moderate pain.   iron polysaccharides 150 MG capsule Commonly known as:  IFerex 150 Take 1 capsule (150 mg total) by mouth 2 (two) times daily.   linaclotide 145 MCG Caps capsule Commonly known as:  Linzess Take 1 capsule (145 mcg total) by mouth 2 (two) times daily.   losartan 25  MG tablet Commonly known as:  COZAAR Take 1 tablet (25 mg total) by mouth 2 (two) times daily.   metFORMIN 500 MG tablet Commonly known as:  GLUCOPHAGE TAKE 1 TABLET BY MOUTH EVERY DAY WITH BREAKFAST   montelukast 10 MG tablet Commonly known as:  SINGULAIR Take 1 tablet (10 mg total) by mouth at bedtime.   norethindrone 0.35 MG tablet Commonly known as:  MICRONOR,CAMILA,ERRIN   Olopatadine HCl 0.6 % Soln USE TWO SPRAYS IN BOTH NOSTRILS TWICE DAILY   pantoprazole 40 MG tablet Commonly known as:  PROTONIX Take 1 tablet (40 mg total) by mouth daily.   Plecanatide 3 MG Tabs Commonly known as:  Trulance Take 3 mg by mouth daily.   potassium chloride SA 20 MEQ tablet Commonly known as:  K-DUR,KLOR-CON TAKE 1 TABLET BY MOUTH DAILY WHILE TAKING LASIX   pravastatin 20 MG tablet Commonly known as:  PRAVACHOL Take 1 tablet (20 mg total) by mouth daily.   SM Complete Advanced Formula Tabs Take 1 tablet by mouth daily.   Vaniqa 13.9 % cream Generic drug:  Eflornithine HCl APPLY TO AFFECTED AREA TWICE DAILY   Voltaren 1 % Gel Generic drug:  diclofenac sodium APPLY TWO GRAMS TOPICALLY FOUR TIMES DAILY          Objective:    BP 127/81   Pulse 77   Temp (!) 97.5 F (36.4 C) (Oral)   Ht 5\' 2"  (1.575 m)   Wt 141 lb (64 kg)   SpO2 98%   BMI 25.79 kg/m   Allergies  Allergen Reactions  . Demerol  [Meperidine] Shortness Of Breath    FLUSHING AND SHORTNESS OF BREATH  . Morphine And Related Shortness Of Breath    Flushed and hot hyper Flushed and hot hyper Flushed and hot hyper Flushed and hot hyper  . Other Shortness Of Breath    Flushed and hot hyper Flushed and hot hyper Flushed and hot hyper Flushed and hot hyper Flushed and hot hyper  . Cardizem  [Diltiazem Hcl] Other (See Comments)  . Diltiazem Hcl Hives  . Lisinopril     Caused cough  . Lyrica [Pregabalin]     Chest pain, elevated heart rate  . Tramadol Other (See Comments)    Tears up stomach    Wt Readings from Last 3 Encounters:  01/29/19 141 lb (64 kg)  01/12/19 140 lb 9.6 oz (63.8 kg)  01/06/19 140 lb 12.8 oz (63.9 kg)    Physical Exam Constitutional:      Appearance: She is well-developed.  HENT:     Head: Normocephalic and atraumatic.     Right Ear: Tympanic membrane, ear canal and external ear normal.     Left Ear: Tympanic membrane, ear canal and external ear normal.     Nose: Nose normal. No rhinorrhea.     Mouth/Throat:     Pharynx: No oropharyngeal exudate or posterior oropharyngeal erythema.  Eyes:     Conjunctiva/sclera: Conjunctivae normal.     Pupils: Pupils are equal, round, and reactive to light.  Neck:     Musculoskeletal: Normal range of motion and neck supple.  Cardiovascular:     Rate and Rhythm: Normal rate and regular rhythm.     Heart sounds: Normal heart sounds.  Pulmonary:     Effort: Pulmonary effort is normal.     Breath sounds: Normal breath sounds.  Abdominal:     General: Bowel sounds are normal.     Palpations: Abdomen is soft.  Skin:  General: Skin is warm and dry.     Findings: Lesion and rash present.  Neurological:     Mental Status: She is alert and oriented to person, place, and time.     Deep Tendon Reflexes: Reflexes are normal and symmetric.  Psychiatric:        Behavior: Behavior normal.        Thought Content: Thought content normal.         Judgment: Judgment normal.     Results for orders placed or performed in visit on 01/29/19  CBC with Differential  Result Value Ref Range   WBC 4.9 3.4 - 10.8 x10E3/uL   RBC 4.88 3.77 - 5.28 x10E6/uL   Hemoglobin 12.6 11.1 - 15.9 g/dL   Hematocrit 39.3 34.0 - 46.6 %   MCV 81 79 - 97 fL   MCH 25.8 (L) 26.6 - 33.0 pg   MCHC 32.1 31.5 - 35.7 g/dL   RDW 25.1 (H) 11.7 - 15.4 %   Platelets 247 150 - 450 x10E3/uL   Neutrophils 61 Not Estab. %   Lymphs 31 Not Estab. %   Monocytes 8 Not Estab. %   Eos 0 Not Estab. %   Basos 0 Not Estab. %   Neutrophils Absolute 3.0 1.4 - 7.0 x10E3/uL   Lymphocytes Absolute 1.5 0.7 - 3.1 x10E3/uL   Monocytes Absolute 0.4 0.1 - 0.9 x10E3/uL   EOS (ABSOLUTE) 0.0 0.0 - 0.4 x10E3/uL   Basophils Absolute 0.0 0.0 - 0.2 x10E3/uL   Immature Granulocytes 0 Not Estab. %   Immature Grans (Abs) 0.0 0.0 - 0.1 x10E3/uL   Hematology Comments: Note:   Iron  Result Value Ref Range   Iron 87 27 - 159 ug/dL      Assessment & Plan:   1. Hirsutism - VANIQA 13.9 % cream; APPLY TO AFFECTED AREA TWICE DAILY  Dispense: 45 g; Refill: 5  2. Rectal bleeding - CBC with Differential - Iron - Fecal occult blood, imunochemical; Future  3. GAD (generalized anxiety disorder) - ToxASSURE Select 13 (MW), Urine - busPIRone (BUSPAR) 10 MG tablet; Take 1 tablet (10 mg total) by mouth 3 (three) times daily.  Dispense: 90 tablet; Refill: 3  4. S/P cervical spinal fusion - ToxASSURE Select 13 (MW), Urine - HYDROcodone-acetaminophen (NORCO) 5-325 MG tablet; Take 1 tablet by mouth every 8 (eight) hours as needed for moderate pain or severe pain.  Dispense: 30 tablet; Refill: 0 - HYDROcodone-acetaminophen (NORCO) 5-325 MG tablet; Take 1 tablet by mouth every 6 (six) hours as needed for moderate pain.  Dispense: 30 tablet; Refill: 0  5. Weight gain - Diethylpropion HCl CR 75 MG TB24; Take 1 tablet (75 mg total) by mouth daily.  Dispense: 30 each; Refill: 2  6. Acne vulgaris -  adapalene (DIFFERIN) 0.1 % cream; Apply topically at bedtime.  Dispense: 45 g; Refill: 5   Continue all other maintenance medications as listed above.  Follow up plan: Return in about 8 weeks (around 03/26/2019).  Educational handout given for [place] PA-C [place] [address]  [place], [place] [address] [phone]   02/01/2019, 12:47 AM

## 2019-01-30 LAB — CBC WITH DIFFERENTIAL/PLATELET
Basophils Absolute: 0 10*3/uL (ref 0.0–0.2)
Basos: 0 %
EOS (ABSOLUTE): 0 10*3/uL (ref 0.0–0.4)
Eos: 0 %
Hematocrit: 39.3 % (ref 34.0–46.6)
Hemoglobin: 12.6 g/dL (ref 11.1–15.9)
Immature Grans (Abs): 0 10*3/uL (ref 0.0–0.1)
Immature Granulocytes: 0 %
Lymphocytes Absolute: 1.5 10*3/uL (ref 0.7–3.1)
Lymphs: 31 %
MCH: 25.8 pg — ABNORMAL LOW (ref 26.6–33.0)
MCHC: 32.1 g/dL (ref 31.5–35.7)
MCV: 81 fL (ref 79–97)
Monocytes Absolute: 0.4 10*3/uL (ref 0.1–0.9)
Monocytes: 8 %
Neutrophils Absolute: 3 10*3/uL (ref 1.4–7.0)
Neutrophils: 61 %
Platelets: 247 10*3/uL (ref 150–450)
RBC: 4.88 x10E6/uL (ref 3.77–5.28)
RDW: 25.1 % — ABNORMAL HIGH (ref 11.7–15.4)
WBC: 4.9 10*3/uL (ref 3.4–10.8)

## 2019-01-30 LAB — IRON: Iron: 87 ug/dL (ref 27–159)

## 2019-02-01 ENCOUNTER — Encounter: Payer: Self-pay | Admitting: Physician Assistant

## 2019-02-01 DIAGNOSIS — K625 Hemorrhage of anus and rectum: Secondary | ICD-10-CM | POA: Insufficient documentation

## 2019-02-01 DIAGNOSIS — L700 Acne vulgaris: Secondary | ICD-10-CM | POA: Insufficient documentation

## 2019-02-02 ENCOUNTER — Telehealth: Payer: Self-pay | Admitting: *Deleted

## 2019-02-02 NOTE — Telephone Encounter (Signed)
Aware of all current lab results.

## 2019-02-03 ENCOUNTER — Other Ambulatory Visit: Payer: Self-pay

## 2019-02-03 ENCOUNTER — Other Ambulatory Visit: Payer: BLUE CROSS/BLUE SHIELD

## 2019-02-03 DIAGNOSIS — K625 Hemorrhage of anus and rectum: Secondary | ICD-10-CM | POA: Diagnosis not present

## 2019-02-04 ENCOUNTER — Telehealth: Payer: Self-pay | Admitting: Physician Assistant

## 2019-02-04 ENCOUNTER — Telehealth: Payer: Self-pay

## 2019-02-04 LAB — FECAL OCCULT BLOOD, IMMUNOCHEMICAL: Fecal Occult Bld: NEGATIVE

## 2019-02-04 LAB — TOXASSURE SELECT 13 (MW), URINE

## 2019-02-04 NOTE — Telephone Encounter (Signed)
Labs faxed

## 2019-02-04 NOTE — Telephone Encounter (Signed)
Insurance denied prior auth for Vaniqa cream   Not a covered benefit

## 2019-02-05 ENCOUNTER — Telehealth: Payer: Self-pay | Admitting: Physician Assistant

## 2019-02-05 ENCOUNTER — Ambulatory Visit (INDEPENDENT_AMBULATORY_CARE_PROVIDER_SITE_OTHER): Payer: BLUE CROSS/BLUE SHIELD | Admitting: *Deleted

## 2019-02-05 ENCOUNTER — Telehealth: Payer: Self-pay | Admitting: *Deleted

## 2019-02-05 DIAGNOSIS — J309 Allergic rhinitis, unspecified: Secondary | ICD-10-CM | POA: Diagnosis not present

## 2019-02-05 DIAGNOSIS — H1013 Acute atopic conjunctivitis, bilateral: Secondary | ICD-10-CM | POA: Diagnosis not present

## 2019-02-05 DIAGNOSIS — N938 Other specified abnormal uterine and vaginal bleeding: Secondary | ICD-10-CM | POA: Diagnosis not present

## 2019-02-05 DIAGNOSIS — Z6825 Body mass index (BMI) 25.0-25.9, adult: Secondary | ICD-10-CM | POA: Diagnosis not present

## 2019-02-05 NOTE — Telephone Encounter (Signed)
Pt has appointment 02/18/19 for next Nucala injection. Pt wanted to double check and make sure that there wouldn't be any issues with ordering the medication given recent issues due to insurance. Please advise.

## 2019-02-05 NOTE — Telephone Encounter (Signed)
Faxed release # [id number]

## 2019-02-08 NOTE — Telephone Encounter (Signed)
I talked to [facility] on Friday and there were no issues I am aware of they are planning on shipping Rx

## 2019-02-09 ENCOUNTER — Telehealth: Payer: Self-pay | Admitting: Physician Assistant

## 2019-02-10 NOTE — Telephone Encounter (Signed)
Yes, please proceed

## 2019-02-10 NOTE — Telephone Encounter (Signed)
Linzess denied   Do you want appeal done since she says Trulance did not work   And the cream was denied due to not being a covered item on her plan

## 2019-02-11 ENCOUNTER — Other Ambulatory Visit: Payer: Self-pay

## 2019-02-11 ENCOUNTER — Telehealth: Payer: Self-pay | Admitting: Physician Assistant

## 2019-02-11 ENCOUNTER — Encounter: Payer: Self-pay | Admitting: Gastroenterology

## 2019-02-11 ENCOUNTER — Ambulatory Visit (INDEPENDENT_AMBULATORY_CARE_PROVIDER_SITE_OTHER): Payer: BLUE CROSS/BLUE SHIELD | Admitting: Gastroenterology

## 2019-02-11 DIAGNOSIS — D508 Other iron deficiency anemias: Secondary | ICD-10-CM

## 2019-02-11 DIAGNOSIS — K5909 Other constipation: Secondary | ICD-10-CM | POA: Diagnosis not present

## 2019-02-11 DIAGNOSIS — R195 Other fecal abnormalities: Secondary | ICD-10-CM

## 2019-02-11 MED ORDER — LINACLOTIDE 145 MCG PO CAPS: 145.0000 ug | ORAL_CAPSULE | Freq: Two times a day (BID) | ORAL | 0 refills | Status: DC

## 2019-02-11 MED ORDER — LINACLOTIDE 145 MCG PO CAPS: 145.0000 ug | ORAL_CAPSULE | Freq: Two times a day (BID) | ORAL | 3 refills | Status: DC

## 2019-02-11 NOTE — Progress Notes (Addendum)
TELEHEALTH VISIT  Referring Provider: Terald Sleeper, PA-C Primary Care Physician:  Terald Sleeper, PA-C   Tele-visit due to COVID-19 pandemic Patient requested visit virtually, consented to the encounter telephone call Patient seems aware of limitations, risks, security and privacy concerns of performing an evaluation and management service by telephone and the ongoing risk and availability of in-person appointments  Contact made at: 16:00 02/11/19 Patient verified by name and date of birth Location of patient: Home Location provider: My medical office Names of persons participating: Me, patient Time spent on call: 30 minutes   Reason for Consultation:  Anemia   IMPRESSION:  Iron deficiency anemia without overt GI blood loss    - longstanding history    - history of non-compliance with iron supplements    - FOBT negative    - hemoglobin 7.3 01/13/19    - hemoglobin 11.2 06/15/28    - received 2 units PRBCs  Chronic constipation    - previously on     - currently on Linzess Menorrhagia    - followed by Dr. Gari Crown    - on Provera    - D&C and keloid tissue removed from episiotomy 11/2018    - considering hysterectomy Hemorrhoids - treated with topical therapy by Dr. Evie Lacks No prior colon cancer screening  Iron deficiency may be multifactorial including large component of menorrhagia. Well evaluate for GI etiologies and screening for colon cancer giver her age.   PLAN: Continue iron supplements Resume Linzess Follow-up with GYN as previously planned EGD with duodenal biopsies and colonoscopy when Covid19 restrictions have been lifted  30 minutes spent with the patient today. Greater than 50% was spent in counseling and coordination of care with the patient  I consented the patient discussing the risks, benefits, and alternatives to endoscopic evaluation. In particular, we discussed the risks that include, but are not limited to, reaction to medication,  cardiopulmonary compromise, bleeding requiring blood transfusion, aspiration resulting in pneumonia, perforation requiring surgery, lack of diagnosis, severe illness requiring hospitalization, and even death. We reviewed the risk of missed lesion including polyps or even cancer. The patient acknowledges these risks and asks that we proceed.   HPI: Jennifer Mullins is a 48 y.o. female seen in consultation. History obtained through the patient, review of her electronic health record, and CareEverywhere.  History of upper extremity thrombus. On Eliquis until she had severe vaginal bleeding. Replaced Eliquis with aspirin 81 mg daily.  Had a cervical spine fusion requiring chronic hydrocodone, anxiety on buspar, COPD/asthma, fibromyalgia, toxic multinodular goiter.   Labs 01/13/19: hgb 7.3, MCV 75, RDW 19 Labs 01/14/19: hgb 10.1, MCV 76, RDW 19.4 Labs 01/29/19: hgb 12.6, MCV 81, RDW 25.1, iron 87, ferritin 4 Labs 02/03/19: FOBT negative  Has had multiple blood transfusions in the past. She had a transfusion last month at [facility] when she was hospitalized for symptomatic anemia. CT abd/pelvis with contrast 01/13/19 showed no acute abdominal findings.  Last year at a [facility].   Followed by Dr. Evie Lacks, who she saw last week, for menorrhagia. Normal Pap and endometrial biopsy. D&C in January.  Has bleeding that has been lighter on Provera. He has suggested that she may need a hysterectomy.    No symptoms associated with the anemia.  No fatigue, weakness, headache, irritability, exercise intolerance, exertional dyspnea, vertigo, or angina pectoris.  No pica.  No beeturia.  No hearing loss.    No overt GI blood loss except for recent  rectal bleeding. No rectal pain. She attributes the bleeding to hemorrhoids.  No melena, hematochezia. No epistaxis, hemoptysis, or hematuria. No identified exacerbating or relieving features.  She reports chronic constipation. Worsened by iron supplements and pain  medications that she takes for fibromyalgia. Trulance is not working. She feels one small pill once a day is not enough. Resuming Linzess.  Will sometimes have bowel movements twice daily. Uses stool softener or prune juice when needed. No other associated symptoms. No identified exacerbating or relieving features.   No prior colonoscopy or upper endoscopy. No prior colon cancer screening.   Two family deaths in the family recently. She is under significant stress.   No known family history of colon cancer or polyps. No family history of uterine/endometrial cancer, pancreatic cancer or gastric/stomach cancer.  Past Medical History:  Diagnosis Date  . Anxiety   . Arthritis   . Asthma   . Bilateral calcaneal spurs   . Bilateral polycystic ovarian syndrome   . Bulging lumbar disc   . Carpal tunnel syndrome, bilateral   . COPD (chronic obstructive pulmonary disease) (HCC)    BRONCHITIS  . Diabetes mellitus without complication ([place])    TYPE 2   DX  4-5 YRS AGO  . ETD (Eustachian tube dysfunction), bilateral    left worse than right  . Fibromyalgia   . GAD (generalized anxiety disorder)   . GERD (gastroesophageal reflux disease)    TAKES PRESCRIPTION MEDS  . Glaucoma    BOTH EYES  . Hyperlipidemia   . Hypertension   . Hypothyroidism    NODULES ON THYROID  . PONV (postoperative nausea and vomiting)   . Pulmonary embolism ([place])   . Tachycardia   . TMJ (dislocation of temporomandibular joint)   . Trigger finger   . Trigger finger of left thumb     Past Surgical History:  Procedure Laterality Date  . ANTERIOR CERVICAL DECOMP/DISCECTOMY FUSION N/A 11/13/2016   Procedure: Cervical five-six, Cervical six-seven Anterior Cervical Discectomy and Fusion, Allograft, Plate;  Surgeon: Marybelle Killings, MD;  Location: [facility];  Service: Orthopedics;  Laterality: N/A;  . BREAST SURGERY    . CARPAL TUNNEL RELEASE    . DILATION AND CURETTAGE OF UTERUS    . EXPLORATORY LAPAROTOMY    . LIPOMA  EXCISION Left 06/15/2018   Procedure: LEFT CALF LIPOMA EXCISION;  Surgeon: Marybelle Killings, MD;  Location: [place];  Service: Orthopedics;  Laterality: Left;  . NASAL SINUS SURGERY  07/22/13   Dr. Redmond Pulling in [place]  . TRIGGER FINGER RELEASE Right 06/15/2018   Procedure: RIGHT TRIGGER THUMB RELEASE;  Surgeon: Marybelle Killings, MD;  Location: [place];  Service: Orthopedics;  Laterality: Right;    Current Outpatient Medications  Medication Sig Dispense Refill  . adapalene (DIFFERIN) 0.1 % cream Apply topically at bedtime. 45 g 5  . albuterol (PROVENTIL HFA;VENTOLIN HFA) 108 (90 Base) MCG/ACT inhaler Inhale 2 puffs into the lungs every 6 (six) hours as needed for wheezing or shortness of breath. 1 Inhaler 3  . albuterol (PROVENTIL) (2.5 MG/3ML) 0.083% nebulizer solution Take 3 mLs (2.5 mg total) by nebulization every 6 (six) hours as needed for wheezing or shortness of breath. 1080 mL 0  . ALPRAZolam (XANAX) 0.5 MG tablet TAKE 1 TABLET BY MOUTH TWICE DAILY AS NEEDED. MAY TAKE ONE MORE TABLET BEFORE FOR ALLERGY SHOTS ONCE A WEEK (30 DAY supply) 65 tablet 5  . aspirin 81 MG chewable tablet Chew 81 mg by  mouth daily.    Marland Kitchen AUVI-Q 0.3 MG/0.3ML SOAJ injection Use as directed for life-threatening allergic reaction. 2 Device 3  . baclofen (LIORESAL) 10 MG tablet Take 10 mg by mouth 3 (three) times daily as needed.  1  . budesonide-formoterol (SYMBICORT) 80-4.5 MCG/ACT inhaler Inhale 1-2 puffs into the lungs daily. 1 Inhaler 5  . busPIRone (BUSPAR) 10 MG tablet Take 1 tablet (10 mg total) by mouth 3 (three) times daily. 90 tablet 3  . cetirizine (ZYRTEC) 10 MG tablet Take 1 tablet (10 mg total) by mouth 2 (two) times daily. 60 tablet 5  . cromolyn (OPTICROM) 4 % ophthalmic solution Place 1 drop into both eyes 4 (four) times daily.   1  . cycloSPORINE (RESTASIS) 0.05 % ophthalmic emulsion Place 1 drop into both eyes 2 (two) times daily.    . Diethylpropion HCl CR 75 MG TB24 Take 1  tablet (75 mg total) by mouth daily. 30 each 2  . EPIDUO 0.1-2.5 % gel AFFECTED AREA(S) ON FACE AND CHEST EVERY NIGHT AT BEDTIME 45 g 5  . fluticasone (FLONASE) 50 MCG/ACT nasal spray 1-2 sprays in each nostril 3 times weekly as needed. 16 g 5  . furosemide (LASIX) 20 MG tablet TAKE 1 TABLET BY MOUTH DAILY AS NEEDED FOR FLUID 90 tablet 3  . HYDROcodone-acetaminophen (NORCO) 5-325 MG tablet Take 1 tablet by mouth every 8 (eight) hours as needed for moderate pain or severe pain. 30 tablet 0  . HYDROcodone-acetaminophen (NORCO) 5-325 MG tablet Take 1 tablet by mouth every 6 (six) hours as needed for moderate pain. 30 tablet 0  . iron polysaccharides (IFEREX 150) 150 MG capsule Take 1 capsule (150 mg total) by mouth 2 (two) times daily. 60 capsule 5  . linaclotide (LINZESS) 145 MCG CAPS capsule Take 1 capsule (145 mcg total) by mouth 2 (two) times daily. 8 capsule 0  . losartan (COZAAR) 25 MG tablet Take 1 tablet (25 mg total) by mouth 2 (two) times daily. 180 tablet 3  . metFORMIN (GLUCOPHAGE) 500 MG tablet TAKE 1 TABLET BY MOUTH EVERY DAY WITH BREAKFAST 90 tablet 1  . montelukast (SINGULAIR) 10 MG tablet Take 1 tablet (10 mg total) by mouth at bedtime. 30 tablet 5  . Multiple Vitamins-Minerals (SM COMPLETE ADVANCED FORMULA) TABS Take 1 tablet by mouth daily.     . norethindrone (MICRONOR,CAMILA,ERRIN) 0.35 MG tablet     . Olopatadine HCl 0.6 % SOLN USE TWO SPRAYS IN BOTH NOSTRILS TWICE DAILY 30.5 g 5  . pantoprazole (PROTONIX) 40 MG tablet Take 1 tablet (40 mg total) by mouth daily. (Patient taking differently: Take 40 mg by mouth every morning. ) 90 tablet 1  . Plecanatide (TRULANCE) 3 MG TABS Take 3 mg by mouth daily. 30 tablet 5  . potassium chloride SA (K-DUR,KLOR-CON) 20 MEQ tablet TAKE 1 TABLET BY MOUTH DAILY WHILE TAKING LASIX 90 tablet 1  . pravastatin (PRAVACHOL) 20 MG tablet Take 1 tablet (20 mg total) by mouth daily. (Patient taking differently: Take 20 mg by mouth at bedtime. ) 90 tablet 1   . VANIQA 13.9 % cream APPLY TO AFFECTED AREA TWICE DAILY 45 g 5  . VOLTAREN 1 % GEL APPLY TWO GRAMS TOPICALLY FOUR TIMES DAILY 300 g 5   Current Facility-Administered Medications  Medication Dose Route Frequency Provider Last Rate Last Dose  . Mepolizumab SOLR 100 mg  100 mg Subcutaneous Q28 days Jiles Prows, MD   100 mg at 01/21/19 7782    Allergies  as of 02/11/2019 - Review Complete 01/29/2019  Allergen Reaction Noted  . Demerol [meperidine] Shortness Of Breath 07/28/2015  . Morphine and related Shortness Of Breath 02/24/2012  . Other Shortness Of Breath 02/24/2012  . Cardizem  [diltiazem hcl] Other (See Comments) 09/23/2018  . Diltiazem hcl Hives 02/24/2012  . Lisinopril  01/20/2017  . Lyrica [pregabalin]  09/26/2017  . Tramadol Other (See Comments) 04/04/2017    Family History  Problem Relation Age of Onset  . Hypertension Mother   . Hypertension Father     Social History   Socioeconomic History  . Marital status: Married    Spouse name: Not on file  . Number of children: Not on file  . Years of education: Not on file  . Highest education level: Not on file  Occupational History  . Occupation: Unemployed  Social Needs  . Financial resource strain: Not on file  . Food insecurity:    Worry: Not on file    Inability: Not on file  . Transportation needs:    Medical: Not on file    Non-medical: Not on file  Tobacco Use  . Smoking status: Former Smoker    Packs/day: 0.25    Years: 5.00    Pack years: 1.25    Types: Cigarettes    Last attempt to quit: 11/18/1989    Years since quitting: 29.2  . Smokeless tobacco: Never Used  Substance and Sexual Activity  . Alcohol use: No  . Drug use: No  . Sexual activity: Yes    Birth control/protection: None  Lifestyle  . Physical activity:    Days per week: Not on file    Minutes per session: Not on file  . Stress: Not on file  Relationships  . Social connections:    Talks on phone: Not on file    Gets together:  Not on file    Attends religious service: Not on file    Active member of club or organization: Not on file    Attends meetings of clubs or organizations: Not on file    Relationship status: Not on file  . Intimate partner violence:    Fear of current or ex partner: Not on file    Emotionally abused: Not on file    Physically abused: Not on file    Forced sexual activity: Not on file  Other Topics Concern  . Not on file  Social History Narrative  . Not on file    Review of Systems: ALL ROS discussed and all others negative except listed in HPI.  Physical Exam: General: in no acute distress Neuro: Alert and appropriate Psych: Normal affect and normal insight   Jennifer L. Tarri Glenn, MD, MPH [facility] 02/11/2019, 4:01 PM

## 2019-02-11 NOTE — Telephone Encounter (Signed)
Detailed message left for patient that samples were placed up front.

## 2019-02-11 NOTE — Patient Instructions (Addendum)
I have recommended a colonoscopy and upper endoscopy.  Please continue with take your iron supplements.  Please call me if your blood count declines or if you see any blood.   Thank you for your patience with me and our technology today! Please stay home, safe, and healthy. I look forward to meeting you in person in the future.

## 2019-02-12 ENCOUNTER — Telehealth: Payer: BLUE CROSS/BLUE SHIELD | Admitting: Gastroenterology

## 2019-02-16 ENCOUNTER — Telehealth: Payer: Self-pay | Admitting: *Deleted

## 2019-02-16 DIAGNOSIS — J4550 Severe persistent asthma, uncomplicated: Secondary | ICD-10-CM | POA: Diagnosis not present

## 2019-02-16 DIAGNOSIS — G894 Chronic pain syndrome: Secondary | ICD-10-CM | POA: Diagnosis not present

## 2019-02-16 DIAGNOSIS — Z79899 Other long term (current) drug therapy: Secondary | ICD-10-CM | POA: Diagnosis not present

## 2019-02-16 NOTE — Telephone Encounter (Signed)
Jennifer Mullins called regarding her Nucla and stated that she informed the specialty pharmacy that we can be in charge of ordering her Nucala and ok shipment.

## 2019-02-16 NOTE — Telephone Encounter (Signed)
Ok will see when I order again if she gave permission

## 2019-02-17 ENCOUNTER — Other Ambulatory Visit: Payer: Self-pay

## 2019-02-17 ENCOUNTER — Ambulatory Visit (INDEPENDENT_AMBULATORY_CARE_PROVIDER_SITE_OTHER): Payer: BLUE CROSS/BLUE SHIELD | Admitting: *Deleted

## 2019-02-17 DIAGNOSIS — J4550 Severe persistent asthma, uncomplicated: Secondary | ICD-10-CM | POA: Diagnosis not present

## 2019-02-17 NOTE — Progress Notes (Signed)
Appeal in process.

## 2019-02-18 ENCOUNTER — Other Ambulatory Visit: Payer: Self-pay | Admitting: Physician Assistant

## 2019-02-18 ENCOUNTER — Other Ambulatory Visit: Payer: Self-pay | Admitting: Obstetrics & Gynecology

## 2019-02-18 ENCOUNTER — Ambulatory Visit: Payer: BLUE CROSS/BLUE SHIELD

## 2019-02-18 ENCOUNTER — Other Ambulatory Visit: Payer: Self-pay

## 2019-02-18 MED ORDER — BACLOFEN 10 MG PO TABS: 10.0000 mg | ORAL_TABLET | Freq: Three times a day (TID) | ORAL | 1 refills | Status: DC | PRN

## 2019-02-18 NOTE — Telephone Encounter (Signed)
Patient is requesting refill of her Baclofen 10 mg

## 2019-02-18 NOTE — Telephone Encounter (Signed)
sent 

## 2019-02-19 ENCOUNTER — Ambulatory Visit (INDEPENDENT_AMBULATORY_CARE_PROVIDER_SITE_OTHER): Payer: BLUE CROSS/BLUE SHIELD | Admitting: *Deleted

## 2019-02-19 DIAGNOSIS — J309 Allergic rhinitis, unspecified: Secondary | ICD-10-CM

## 2019-02-19 NOTE — Telephone Encounter (Signed)
Pt aware rx sent in. 

## 2019-02-20 ENCOUNTER — Other Ambulatory Visit: Payer: Self-pay | Admitting: Pediatrics

## 2019-02-23 ENCOUNTER — Telehealth: Payer: Self-pay | Admitting: Physician Assistant

## 2019-02-23 ENCOUNTER — Ambulatory Visit (INDEPENDENT_AMBULATORY_CARE_PROVIDER_SITE_OTHER): Payer: BLUE CROSS/BLUE SHIELD | Admitting: Physician Assistant

## 2019-02-23 ENCOUNTER — Other Ambulatory Visit: Payer: Self-pay | Admitting: Physician Assistant

## 2019-02-23 ENCOUNTER — Other Ambulatory Visit: Payer: Self-pay

## 2019-02-23 ENCOUNTER — Encounter: Payer: Self-pay | Admitting: Physician Assistant

## 2019-02-23 DIAGNOSIS — M797 Fibromyalgia: Secondary | ICD-10-CM | POA: Diagnosis not present

## 2019-02-23 DIAGNOSIS — Z207 Contact with and (suspected) exposure to pediculosis, acariasis and other infestations: Secondary | ICD-10-CM

## 2019-02-23 DIAGNOSIS — Z2089 Contact with and (suspected) exposure to other communicable diseases: Secondary | ICD-10-CM | POA: Diagnosis not present

## 2019-02-23 MED ORDER — PREDNISONE 10 MG (21) PO TBPK: ORAL_TABLET | ORAL | 0 refills | Status: DC

## 2019-02-23 MED ORDER — PERMETHRIN 5 % EX CREA: 1.0000 "application " | TOPICAL_CREAM | Freq: Once | CUTANEOUS | 0 refills | Status: AC

## 2019-02-23 MED ORDER — IVERMECTIN 0.5 % EX LOTN: 1.0000 "application " | TOPICAL_LOTION | Freq: Once | CUTANEOUS | 1 refills | Status: DC

## 2019-02-23 NOTE — Telephone Encounter (Signed)
Pain Management, I told her I absolutely could not increase her pain med any higher than the current dose. She get benzodiazepine from me.

## 2019-02-23 NOTE — Progress Notes (Signed)
Telephone visit  Subjective: KT:GYBW and fibromyalgia PCP: Terald Sleeper, PA-C Jennifer Mullins is a 48 y.o. female calls for telephone consult today. Patient provides verbal consent for consult held via phone.  Patient is identified with 2 separate identifiers.  At this time the entire area is on COVID-19 social distancing and stay home orders are in place.  Patient is of higher risk and therefore we are performing this by a virtual method.  Location of provider: home Location of patient: home Others present for call: no  This patient has an issue with a rash.  It is been going on for several days.  She has been cleaning out of her son's house that had passed away.  So she is not sure if she may be came into contact with something in that area she has small red dots all over her trunk and neck.  The itching is most severe at night and after a warm shower.  She has had issues with fibromyalgia in recent days.  She has extensive joints that are swollen and tender.  She is unable to take an anti-inflammatory because of her bleeding risk.  She is still dealing with menorrhagia and the possibility of hysterectomy.  But at this point they are still doing medication.  We have had discussion about not raising her pain medication whatsoever.  She is on a very small amount.  I told her though if it gets increased she has to go back to the pain clinic.   ROS: Per HPI  Allergies  Allergen Reactions  . Demerol [Meperidine] Shortness Of Breath    FLUSHING AND SHORTNESS OF BREATH  . Morphine And Related Shortness Of Breath    Flushed and hot hyper Flushed and hot hyper Flushed and hot hyper Flushed and hot hyper  . Other Shortness Of Breath    Flushed and hot hyper Flushed and hot hyper Flushed and hot hyper Flushed and hot hyper Flushed and hot hyper  . Cardizem  [Diltiazem Hcl] Other (See Comments)  . Diltiazem Hcl Hives  . Lisinopril     Caused cough  . Lyrica [Pregabalin]    Chest pain, elevated heart rate  . Tramadol Other (See Comments)    Tears up stomach   Past Medical History:  Diagnosis Date  . Anxiety   . Arthritis   . Asthma   . Bilateral calcaneal spurs   . Bilateral polycystic ovarian syndrome   . Bulging lumbar disc   . Carpal tunnel syndrome, bilateral   . COPD (chronic obstructive pulmonary disease) (HCC)    BRONCHITIS  . Diabetes mellitus without complication ([place])    TYPE 2   DX  4-5 YRS AGO  . ETD (Eustachian tube dysfunction), bilateral    left worse than right  . Fibromyalgia   . GAD (generalized anxiety disorder)   . GERD (gastroesophageal reflux disease)    TAKES PRESCRIPTION MEDS  . Glaucoma    BOTH EYES  . Hyperlipidemia   . Hypertension   . Hypothyroidism    NODULES ON THYROID  . PONV (postoperative nausea and vomiting)   . Pulmonary embolism ([place])   . Tachycardia   . TMJ (dislocation of temporomandibular joint)   . Trigger finger   . Trigger finger of left thumb     Current Outpatient Medications:  .  adapalene (DIFFERIN) 0.1 % cream, Apply topically at bedtime., Disp: 45 g, Rfl: 5 .  albuterol (PROVENTIL) (2.5 MG/3ML) 0.083% nebulizer solution, Take 3 mLs (  2.5 mg total) by nebulization every 6 (six) hours as needed for wheezing or shortness of breath., Disp: 1080 mL, Rfl: 0 .  ALPRAZolam (XANAX) 0.5 MG tablet, TAKE 1 TABLET BY MOUTH TWICE DAILY AS NEEDED. MAY TAKE ONE MORE TABLET BEFORE FOR ALLERGY SHOTS ONCE A WEEK (30 DAY supply), Disp: 65 tablet, Rfl: 5 .  aspirin 81 MG chewable tablet, Chew 81 mg by mouth daily., Disp: , Rfl:  .  AUVI-Q 0.3 MG/0.3ML SOAJ injection, Use as directed for life-threatening allergic reaction., Disp: 2 Device, Rfl: 3 .  baclofen (LIORESAL) 10 MG tablet, Take 1 tablet (10 mg total) by mouth 3 (three) times daily as needed., Disp: 30 each, Rfl: 1 .  budesonide-formoterol (SYMBICORT) 80-4.5 MCG/ACT inhaler, Inhale 1-2 puffs into the lungs daily., Disp: 1 Inhaler, Rfl: 5 .  busPIRone  (BUSPAR) 10 MG tablet, Take 1 tablet (10 mg total) by mouth 3 (three) times daily., Disp: 90 tablet, Rfl: 3 .  cetirizine (ZYRTEC) 10 MG tablet, Take 1 tablet (10 mg total) by mouth 2 (two) times daily., Disp: 60 tablet, Rfl: 5 .  cromolyn (OPTICROM) 4 % ophthalmic solution, Place 1 drop into both eyes 4 (four) times daily. , Disp: , Rfl: 1 .  cycloSPORINE (RESTASIS) 0.05 % ophthalmic emulsion, Place 1 drop into both eyes 2 (two) times daily., Disp: , Rfl:  .  Diethylpropion HCl CR 75 MG TB24, Take 1 tablet (75 mg total) by mouth daily., Disp: 30 each, Rfl: 2 .  EPIDUO 0.1-2.5 % gel, AFFECTED AREA(S) ON FACE AND CHEST EVERY NIGHT AT BEDTIME, Disp: 45 g, Rfl: 5 .  fluticasone (FLONASE) 50 MCG/ACT nasal spray, 1-2 sprays in each nostril 3 times weekly as needed., Disp: 16 g, Rfl: 5 .  furosemide (LASIX) 20 MG tablet, TAKE 1 TABLET BY MOUTH DAILY AS NEEDED FOR FLUID, Disp: 90 tablet, Rfl: 3 .  HYDROcodone-acetaminophen (NORCO) 5-325 MG tablet, Take 1 tablet by mouth every 8 (eight) hours as needed for moderate pain or severe pain., Disp: 30 tablet, Rfl: 0 .  HYDROcodone-acetaminophen (NORCO) 5-325 MG tablet, Take 1 tablet by mouth every 6 (six) hours as needed for moderate pain., Disp: 30 tablet, Rfl: 0 .  iron polysaccharides (IFEREX 150) 150 MG capsule, Take 1 capsule (150 mg total) by mouth 2 (two) times daily., Disp: 60 capsule, Rfl: 5 .  Ivermectin (SKLICE) 0.5 % LOTN, Apply 1 application topically once for 1 dose. Follow package instructions, Disp: 117 g, Rfl: 1 .  losartan (COZAAR) 25 MG tablet, Take 1 tablet (25 mg total) by mouth 2 (two) times daily., Disp: 180 tablet, Rfl: 3 .  medroxyPROGESTERone (PROVERA) 10 MG tablet, TAKE 1 TABLET BY MOUTH DAILY, Disp: 30 tablet, Rfl: 11 .  metFORMIN (GLUCOPHAGE) 500 MG tablet, TAKE 1 TABLET BY MOUTH EVERY DAY WITH BREAKFAST, Disp: 90 tablet, Rfl: 1 .  montelukast (SINGULAIR) 10 MG tablet, Take 1 tablet (10 mg total) by mouth at bedtime., Disp: 30 tablet,  Rfl: 5 .  Multiple Vitamins-Minerals (SM COMPLETE ADVANCED FORMULA) TABS, Take 1 tablet by mouth daily. , Disp: , Rfl:  .  norethindrone (MICRONOR,CAMILA,ERRIN) 0.35 MG tablet, , Disp: , Rfl:  .  Olopatadine HCl 0.6 % SOLN, USE TWO SPRAYS IN BOTH NOSTRILS TWICE DAILY, Disp: 30.5 g, Rfl: 5 .  pantoprazole (PROTONIX) 40 MG tablet, Take 1 tablet (40 mg total) by mouth daily. (Patient taking differently: Take 40 mg by mouth every morning. ), Disp: 90 tablet, Rfl: 1 .  Plecanatide (TRULANCE) 3  MG TABS, Take 3 mg by mouth daily., Disp: 30 tablet, Rfl: 5 .  potassium chloride SA (K-DUR,KLOR-CON) 20 MEQ tablet, TAKE 1 TABLET BY MOUTH DAILY WHILE TAKING LASIX, Disp: 90 tablet, Rfl: 1 .  pravastatin (PRAVACHOL) 20 MG tablet, Take 1 tablet (20 mg total) by mouth daily. (Patient taking differently: Take 20 mg by mouth at bedtime. ), Disp: 90 tablet, Rfl: 1 .  predniSONE (STERAPRED UNI-PAK 21 TAB) 10 MG (21) TBPK tablet, Take as directed, Disp: 21 tablet, Rfl: 0 .  PROAIR HFA 108 (90 Base) MCG/ACT inhaler, INHALE TWO PUFFS INTO THE LUNGS EVERY 6 HOURS AS NEEDED FOR WHEEZING OR SHORTNESS OF BREATH, Disp: 8.5 g, Rfl: 3 .  VANIQA 13.9 % cream, APPLY TO AFFECTED AREA TWICE DAILY, Disp: 45 g, Rfl: 5 .  VOLTAREN 1 % GEL, APPLY TWO GRAMS TOPICALLY FOUR TIMES DAILY, Disp: 300 g, Rfl: 5  Current Facility-Administered Medications:  Marland Kitchen  Mepolizumab SOLR 100 mg, 100 mg, Subcutaneous, Q28 days, Kozlow, Donnamarie Poag, MD, 100 mg at 02/17/19 1054  Assessment/ Plan: 48 y.o. female   1. Scabies exposure - Ivermectin (SKLICE) 0.5 % LOTN; Apply 1 application topically once for 1 dose. Follow package instructions  Dispense: 117 g; Refill: 1  2. Fibromyalgia - predniSONE (STERAPRED UNI-PAK 21 TAB) 10 MG (21) TBPK tablet; Take as directed  Dispense: 21 tablet; Refill: 0   Start time: 8:48 AM End time: 9:01 AM  Meds ordered this encounter  Medications  . predniSONE (STERAPRED UNI-PAK 21 TAB) 10 MG (21) TBPK tablet    Sig: Take  as directed    Dispense:  21 tablet    Refill:  0    Order Specific Question:   Supervising Provider    Answer:   Janora Norlander [[id number]]  . Ivermectin (SKLICE) 0.5 % LOTN    Sig: Apply 1 application topically once for 1 dose. Follow package instructions    Dispense:  117 g    Refill:  1    Order Specific Question:   Supervising Provider    Answer:   Janora Norlander [[id number]]    Particia Nearing PA-C [place] [phone]

## 2019-02-23 NOTE — Telephone Encounter (Signed)
Pt called - she doesn't need azith.  She needs the sclice changed to premeth-elimite 5% cream.  But also needs something for her hair.

## 2019-02-23 NOTE — Telephone Encounter (Signed)
Patient aware and verbalizes understanding. 

## 2019-02-23 NOTE — Telephone Encounter (Signed)
Patient aware and verbalizes understanding- states that she has apt with [facility] pain management tomorrow.  States that she does have a pain contract with Glen[facility] Haring since she is prescribing her Nocro 5-325 for fibromyalgia but states she needs the strength increased- wants to know if [facility] H[facility] would increased the strength or if she should go to pain management?

## 2019-02-23 NOTE — Telephone Encounter (Signed)
LM for pt   Pt needs a zpak - need to know why???

## 2019-02-23 NOTE — Telephone Encounter (Signed)
I will send elimite, and she can apply the lotion to affect areas on the scalp. Azithromycin is not for this.

## 2019-02-24 ENCOUNTER — Other Ambulatory Visit: Payer: Self-pay | Admitting: Physician Assistant

## 2019-02-24 DIAGNOSIS — G894 Chronic pain syndrome: Secondary | ICD-10-CM | POA: Diagnosis not present

## 2019-02-24 DIAGNOSIS — Z79899 Other long term (current) drug therapy: Secondary | ICD-10-CM | POA: Diagnosis not present

## 2019-02-24 MED ORDER — PERMETHRIN 5 % EX CREA: 1.0000 "application " | TOPICAL_CREAM | Freq: Once | CUTANEOUS | 0 refills | Status: DC

## 2019-02-24 NOTE — Telephone Encounter (Signed)
Pt calling in today asked to talk to Particia Nearing please call back

## 2019-02-24 NOTE — Telephone Encounter (Signed)
FYI Pt went to pain management today  Was not satisfied with visit Pt states she is going to stay with Jennifer Mullins for pain management

## 2019-02-24 NOTE — Progress Notes (Unsigned)
permeth

## 2019-02-25 ENCOUNTER — Other Ambulatory Visit: Payer: Self-pay | Admitting: Allergy and Immunology

## 2019-02-27 ENCOUNTER — Other Ambulatory Visit: Payer: Self-pay | Admitting: Allergy and Immunology

## 2019-03-01 MED ORDER — STERILE WATER FOR INJECTION IJ SOLN: 10.0000 mL | INTRAMUSCULAR | 11 refills | Status: DC

## 2019-03-04 ENCOUNTER — Telehealth: Payer: Self-pay

## 2019-03-04 NOTE — Telephone Encounter (Signed)
An appeal was created to try to get patient's Vaniqa approved, the appeal was denied and her insurance company will not pay for this medication.

## 2019-03-05 ENCOUNTER — Other Ambulatory Visit: Payer: Self-pay

## 2019-03-05 ENCOUNTER — Ambulatory Visit (INDEPENDENT_AMBULATORY_CARE_PROVIDER_SITE_OTHER): Payer: BLUE CROSS/BLUE SHIELD | Admitting: Physician Assistant

## 2019-03-05 ENCOUNTER — Encounter: Payer: Self-pay | Admitting: Physician Assistant

## 2019-03-05 DIAGNOSIS — J0111 Acute recurrent frontal sinusitis: Secondary | ICD-10-CM | POA: Diagnosis not present

## 2019-03-05 MED ORDER — FLUCONAZOLE 150 MG PO TABS: 150.0000 mg | ORAL_TABLET | Freq: Once | ORAL | 0 refills | Status: AC

## 2019-03-05 MED ORDER — AMOXICILLIN 500 MG PO CAPS: 500.0000 mg | ORAL_CAPSULE | Freq: Three times a day (TID) | ORAL | 0 refills | Status: DC

## 2019-03-05 NOTE — Progress Notes (Signed)
Telephone visit  Subjective: XI:HWTUU infection PCP: Terald Sleeper, PA-C EKC:[id number] Jennifer Mullins is a 48 y.o. female calls for telephone consult today. Patient provides verbal consent for consult held via phone.  Patient is identified with 2 separate identifiers.  At this time the entire area is on COVID-19 social distancing and stay home orders are in place.  Patient is of higher risk and therefore we are performing this by a virtual method.  Location of provider: home Location of patient: home Others present for call: no  This patient has had many days of sinus headache and postnasal drainage. There is copious drainage at times. Denies any fever at this time. There has been a history of sinus infections in the past.  No history of sinus surgery. There is cough at night. It has become more prevalent in recent days.    ROS: Per HPI  Allergies  Allergen Reactions  . Demerol [Meperidine] Shortness Of Breath    FLUSHING AND SHORTNESS OF BREATH  . Morphine And Related Shortness Of Breath    Flushed and hot hyper Flushed and hot hyper Flushed and hot hyper Flushed and hot hyper  . Other Shortness Of Breath    Flushed and hot hyper Flushed and hot hyper Flushed and hot hyper Flushed and hot hyper Flushed and hot hyper  . Cardizem  [Diltiazem Hcl] Other (See Comments)  . Diltiazem Hcl Hives  . Lisinopril     Caused cough  . Lyrica [Pregabalin]     Chest pain, elevated heart rate  . Tramadol Other (See Comments)    Tears up stomach   Past Medical History:  Diagnosis Date  . Anxiety   . Arthritis   . Asthma   . Bilateral calcaneal spurs   . Bilateral polycystic ovarian syndrome   . Bulging lumbar disc   . Carpal tunnel syndrome, bilateral   . COPD (chronic obstructive pulmonary disease) (HCC)    BRONCHITIS  . Diabetes mellitus without complication ([place])    TYPE 2   DX  4-5 YRS AGO  . ETD (Eustachian tube dysfunction), bilateral    left worse than right  .  Fibromyalgia   . GAD (generalized anxiety disorder)   . GERD (gastroesophageal reflux disease)    TAKES PRESCRIPTION MEDS  . Glaucoma    BOTH EYES  . Hyperlipidemia   . Hypertension   . Hypothyroidism    NODULES ON THYROID  . PONV (postoperative nausea and vomiting)   . Pulmonary embolism ([place])   . Tachycardia   . TMJ (dislocation of temporomandibular joint)   . Trigger finger   . Trigger finger of left thumb     Current Outpatient Medications:  .  adapalene (DIFFERIN) 0.1 % cream, Apply topically at bedtime., Disp: 45 g, Rfl: 5 .  albuterol (PROVENTIL) (2.5 MG/3ML) 0.083% nebulizer solution, Take 3 mLs (2.5 mg total) by nebulization every 6 (six) hours as needed for wheezing or shortness of breath., Disp: 1080 mL, Rfl: 0 .  ALPRAZolam (XANAX) 0.5 MG tablet, TAKE 1 TABLET BY MOUTH TWICE DAILY AS NEEDED. MAY TAKE ONE MORE TABLET BEFORE FOR ALLERGY SHOTS ONCE A WEEK (30 DAY supply), Disp: 65 tablet, Rfl: 5 .  aspirin 81 MG chewable tablet, Chew 81 mg by mouth daily., Disp: , Rfl:  .  AUVI-Q 0.3 MG/0.3ML SOAJ injection, Use as directed for life-threatening allergic reaction., Disp: 2 Device, Rfl: 3 .  baclofen (LIORESAL) 10 MG tablet, Take 1 tablet (10 mg total) by mouth  3 (three) times daily as needed., Disp: 30 each, Rfl: 1 .  budesonide-formoterol (SYMBICORT) 80-4.5 MCG/ACT inhaler, Inhale 1-2 puffs into the lungs daily., Disp: 1 Inhaler, Rfl: 5 .  busPIRone (BUSPAR) 10 MG tablet, Take 1 tablet (10 mg total) by mouth 3 (three) times daily., Disp: 90 tablet, Rfl: 3 .  cetirizine (ZYRTEC) 10 MG tablet, TAKE 1 TABLET BY MOUTH TWICE DAILY, Disp: 60 tablet, Rfl: 2 .  cromolyn (OPTICROM) 4 % ophthalmic solution, Place 1 drop into both eyes 4 (four) times daily. , Disp: , Rfl: 1 .  cycloSPORINE (RESTASIS) 0.05 % ophthalmic emulsion, Place 1 drop into both eyes 2 (two) times daily., Disp: , Rfl:  .  Diethylpropion HCl CR 75 MG TB24, Take 1 tablet (75 mg total) by mouth daily., Disp: 30 each,  Rfl: 2 .  EPIDUO 0.1-2.5 % gel, AFFECTED AREA(S) ON FACE AND CHEST EVERY NIGHT AT BEDTIME, Disp: 45 g, Rfl: 5 .  fluticasone (FLONASE) 50 MCG/ACT nasal spray, 1-2 sprays in each nostril 3 times weekly as needed., Disp: 16 g, Rfl: 5 .  furosemide (LASIX) 20 MG tablet, TAKE 1 TABLET BY MOUTH DAILY AS NEEDED FOR FLUID, Disp: 90 tablet, Rfl: 3 .  HYDROcodone-acetaminophen (NORCO) 5-325 MG tablet, Take 1 tablet by mouth every 8 (eight) hours as needed for moderate pain or severe pain., Disp: 30 tablet, Rfl: 0 .  HYDROcodone-acetaminophen (NORCO) 5-325 MG tablet, Take 1 tablet by mouth every 6 (six) hours as needed for moderate pain., Disp: 30 tablet, Rfl: 0 .  iron polysaccharides (IFEREX 150) 150 MG capsule, Take 1 capsule (150 mg total) by mouth 2 (two) times daily., Disp: 60 capsule, Rfl: 5 .  losartan (COZAAR) 25 MG tablet, Take 1 tablet (25 mg total) by mouth 2 (two) times daily., Disp: 180 tablet, Rfl: 3 .  medroxyPROGESTERone (PROVERA) 10 MG tablet, TAKE 1 TABLET BY MOUTH DAILY, Disp: 30 tablet, Rfl: 11 .  metFORMIN (GLUCOPHAGE) 500 MG tablet, TAKE 1 TABLET BY MOUTH EVERY DAY WITH BREAKFAST, Disp: 90 tablet, Rfl: 1 .  montelukast (SINGULAIR) 10 MG tablet, Take 1 tablet (10 mg total) by mouth at bedtime., Disp: 30 tablet, Rfl: 5 .  Multiple Vitamins-Minerals (SM COMPLETE ADVANCED FORMULA) TABS, Take 1 tablet by mouth daily. , Disp: , Rfl:  .  norethindrone (MICRONOR,CAMILA,ERRIN) 0.35 MG tablet, , Disp: , Rfl:  .  NUCALA 100 MG SOLR, inject 100 mg under the skin every 4 weeks (to be given by a healthcare provider), Disp: 1 each, Rfl: 10 .  Olopatadine HCl 0.6 % SOLN, USE TWO SPRAYS IN BOTH NOSTRILS TWICE DAILY, Disp: 30.5 g, Rfl: 5 .  pantoprazole (PROTONIX) 40 MG tablet, Take 1 tablet (40 mg total) by mouth daily. (Patient taking differently: Take 40 mg by mouth every morning. ), Disp: 90 tablet, Rfl: 1 .  Plecanatide (TRULANCE) 3 MG TABS, Take 3 mg by mouth daily., Disp: 30 tablet, Rfl: 5 .   potassium chloride SA (K-DUR,KLOR-CON) 20 MEQ tablet, TAKE 1 TABLET BY MOUTH DAILY WHILE TAKING LASIX, Disp: 90 tablet, Rfl: 1 .  pravastatin (PRAVACHOL) 20 MG tablet, Take 1 tablet (20 mg total) by mouth daily. (Patient taking differently: Take 20 mg by mouth at bedtime. ), Disp: 90 tablet, Rfl: 1 .  predniSONE (STERAPRED UNI-PAK 21 TAB) 10 MG (21) TBPK tablet, Take as directed, Disp: 21 tablet, Rfl: 0 .  PROAIR HFA 108 (90 Base) MCG/ACT inhaler, INHALE TWO PUFFS INTO THE LUNGS EVERY 6 HOURS AS NEEDED FOR WHEEZING  OR SHORTNESS OF BREATH, Disp: 8.5 g, Rfl: 3 .  VANIQA 13.9 % cream, APPLY TO AFFECTED AREA TWICE DAILY, Disp: 45 g, Rfl: 5 .  VOLTAREN 1 % GEL, APPLY TWO GRAMS TOPICALLY FOUR TIMES DAILY, Disp: 300 g, Rfl: 5 .  Water For Injection Sterile (STERILE WATER, PRESERVATIVE FREE,) injection, Inject 10 mLs as directed as directed., Disp: 10 mL, Rfl: 11  Current Facility-Administered Medications:  Marland Kitchen  Mepolizumab SOLR 100 mg, 100 mg, Subcutaneous, Q28 days, Kozlow, Donnamarie Poag, MD, 100 mg at 02/17/19 1054  Assessment/ Plan: 48 y.o. female   1. Acute recurrent frontal sinusitis - amoxicillin (AMOXIL) 500 MG capsule; Take 1 capsule (500 mg total) by mouth 3 (three) times daily.  Dispense: 30 capsule; Refill: 0 - fluconazole (DIFLUCAN) 150 MG tablet; Take 1 tablet (150 mg total) by mouth once for 1 dose.  Dispense: 1 tablet; Refill: 0    Start time: 9:19 am End time: 9:27 AM  No orders of the defined types were placed in this encounter.   Particia Nearing PA-C [place] [phone]

## 2019-03-08 ENCOUNTER — Ambulatory Visit (INDEPENDENT_AMBULATORY_CARE_PROVIDER_SITE_OTHER): Payer: BLUE CROSS/BLUE SHIELD

## 2019-03-08 DIAGNOSIS — J309 Allergic rhinitis, unspecified: Secondary | ICD-10-CM

## 2019-03-08 NOTE — Addendum Note (Signed)
Addended by: Donell Beers on: 03/08/2019 08:53 PM   Modules accepted: Level of Service

## 2019-03-16 ENCOUNTER — Other Ambulatory Visit: Payer: Self-pay | Admitting: Physician Assistant

## 2019-03-16 ENCOUNTER — Telehealth: Payer: Self-pay

## 2019-03-16 DIAGNOSIS — J4550 Severe persistent asthma, uncomplicated: Secondary | ICD-10-CM | POA: Diagnosis not present

## 2019-03-16 DIAGNOSIS — Z981 Arthrodesis status: Secondary | ICD-10-CM

## 2019-03-16 MED ORDER — HYDROCODONE-ACETAMINOPHEN 5-325 MG PO TABS: 1.0000 | ORAL_TABLET | Freq: Three times a day (TID) | ORAL | 0 refills | Status: DC | PRN

## 2019-03-16 MED ORDER — HYDROCODONE-ACETAMINOPHEN 5-325 MG PO TABS: 1.0000 | ORAL_TABLET | Freq: Four times a day (QID) | ORAL | 0 refills | Status: DC | PRN

## 2019-03-16 NOTE — Telephone Encounter (Signed)
Patient aware.

## 2019-03-16 NOTE — Telephone Encounter (Signed)
Sent corrected scripts for 2 months, appointment at that time

## 2019-03-16 NOTE — Telephone Encounter (Signed)
Patient states that her Norco was written wrong- patient states she is supposed to get 90 for a month and only go 30. Please advise

## 2019-03-17 ENCOUNTER — Ambulatory Visit (INDEPENDENT_AMBULATORY_CARE_PROVIDER_SITE_OTHER): Payer: BLUE CROSS/BLUE SHIELD

## 2019-03-17 ENCOUNTER — Other Ambulatory Visit: Payer: Self-pay

## 2019-03-17 DIAGNOSIS — J4550 Severe persistent asthma, uncomplicated: Secondary | ICD-10-CM

## 2019-03-19 ENCOUNTER — Ambulatory Visit (INDEPENDENT_AMBULATORY_CARE_PROVIDER_SITE_OTHER): Payer: BLUE CROSS/BLUE SHIELD | Admitting: *Deleted

## 2019-03-19 DIAGNOSIS — J309 Allergic rhinitis, unspecified: Secondary | ICD-10-CM

## 2019-03-26 ENCOUNTER — Ambulatory Visit (INDEPENDENT_AMBULATORY_CARE_PROVIDER_SITE_OTHER): Payer: BLUE CROSS/BLUE SHIELD | Admitting: *Deleted

## 2019-03-26 DIAGNOSIS — J309 Allergic rhinitis, unspecified: Secondary | ICD-10-CM

## 2019-03-28 ENCOUNTER — Other Ambulatory Visit: Payer: Self-pay | Admitting: Family Medicine

## 2019-03-28 DIAGNOSIS — M797 Fibromyalgia: Secondary | ICD-10-CM

## 2019-04-01 ENCOUNTER — Other Ambulatory Visit: Payer: Self-pay | Admitting: *Deleted

## 2019-04-01 ENCOUNTER — Ambulatory Visit (INDEPENDENT_AMBULATORY_CARE_PROVIDER_SITE_OTHER): Payer: BLUE CROSS/BLUE SHIELD

## 2019-04-01 DIAGNOSIS — J309 Allergic rhinitis, unspecified: Secondary | ICD-10-CM

## 2019-04-01 MED ORDER — AUVI-Q 0.3 MG/0.3ML IJ SOAJ: INTRAMUSCULAR | 3 refills | Status: DC

## 2019-04-06 ENCOUNTER — Other Ambulatory Visit: Payer: Self-pay

## 2019-04-06 MED ORDER — AUVI-Q 0.3 MG/0.3ML IJ SOAJ: INTRAMUSCULAR | 3 refills | Status: DC

## 2019-04-06 NOTE — Addendum Note (Signed)
Addended by: Lucrezia Starch I on: 04/06/2019 03:38 PM   Modules accepted: Orders

## 2019-04-07 ENCOUNTER — Other Ambulatory Visit: Payer: Self-pay

## 2019-04-07 ENCOUNTER — Encounter: Payer: Self-pay | Admitting: Physician Assistant

## 2019-04-07 ENCOUNTER — Other Ambulatory Visit: Payer: Self-pay | Admitting: *Deleted

## 2019-04-07 ENCOUNTER — Other Ambulatory Visit: Payer: Self-pay | Admitting: Physician Assistant

## 2019-04-07 ENCOUNTER — Ambulatory Visit: Payer: BLUE CROSS/BLUE SHIELD | Admitting: Physician Assistant

## 2019-04-07 VITALS — BP 121/85 | HR 100 | Temp 98.6°F | Ht 62.0 in | Wt 140.2 lb

## 2019-04-07 DIAGNOSIS — D500 Iron deficiency anemia secondary to blood loss (chronic): Secondary | ICD-10-CM

## 2019-04-07 DIAGNOSIS — R635 Abnormal weight gain: Secondary | ICD-10-CM

## 2019-04-07 DIAGNOSIS — Z981 Arthrodesis status: Secondary | ICD-10-CM

## 2019-04-07 DIAGNOSIS — L680 Hirsutism: Secondary | ICD-10-CM | POA: Diagnosis not present

## 2019-04-07 DIAGNOSIS — M47816 Spondylosis without myelopathy or radiculopathy, lumbar region: Secondary | ICD-10-CM

## 2019-04-07 MED ORDER — DIETHYLPROPION HCL ER 75 MG PO TB24: 75.0000 mg | ORAL_TABLET | Freq: Every day | ORAL | 2 refills | Status: DC

## 2019-04-07 MED ORDER — HYDROCODONE-ACETAMINOPHEN 5-325 MG PO TABS: 1.0000 | ORAL_TABLET | Freq: Four times a day (QID) | ORAL | 0 refills | Status: DC | PRN

## 2019-04-07 MED ORDER — VANIQA 13.9 % EX CREA: TOPICAL_CREAM | CUTANEOUS | 5 refills | Status: DC

## 2019-04-07 MED ORDER — HYDROCODONE-ACETAMINOPHEN 5-325 MG PO TABS: 1.0000 | ORAL_TABLET | Freq: Three times a day (TID) | ORAL | 0 refills | Status: DC | PRN

## 2019-04-07 MED ORDER — CELACYN EX GEL: 1.0000 "application " | Freq: Every day | CUTANEOUS | 5 refills | Status: DC

## 2019-04-07 NOTE — Progress Notes (Signed)
BP 121/85   Pulse 100   Temp 98.6 F (37 C) (Oral)   Ht 5\' 2"  (1.575 m)   Wt 140 lb 3.2 oz (63.6 kg)   BMI 25.64 kg/m    Subjective:    Patient ID: Jennifer Mullins, female    DOB: 30-Nov-1970, 48 y.o.   MRN: [id number]  HPI: Jennifer Mullins is a 48 y.o. female presenting on 04/07/2019 for No chief complaint on file.  Patient comes in for periodic recheck.  She had been going to [facility] for pain control.  She had been seen Jeanella Anton nurse practitioner.  The phone number is [phone], extension [phone].  Back in February she had a hospitalization related to severe anemia.  They have been able to control the bleeding and have this greatly improved.  She had become a new patient with me around that time.  Dr. Evette Doffing had left this office.  And I allowed her to come over with pain medication with [place] but at an even lower dose that she had before.  I told her she could have 90 tablets/month but would need to extend those.  She did end up having much more back and neck pain related to her degenerative disc, fibromyalgia.  She tried to go back to the pain center but they did not want her to come because she had come to me.  I do think she is appropriate for a pain center.  I am giving her a limited amount of alprazolam and would feel comfortable with her going to the center.  We will send a referral for her to go back to that office if at all possible.    PAIN ASSESSMENT: Cause of pain-degenerative disc throughout spine CT 01/14/2019 :impingement at L4-5 due to spondylosis and degenerative disc disease. Status post lower cervical ACDF with development of solid arthrodesis at C5-C6. Improved since 2018 but perhaps not yet fullysolid arthrodesis at C6-C7. This patient returns for a 3 month recheck on narcotic use for the above named conditions  Current medications- Baclofen Voltaren gel Norco 5/325 1 up to every 6 hours as needed for pain number 90/month  Medication  side effects- no Any concerns-does not feel that the medication is helping enough.  We have had a long discussion about her going back to a pain center.  We will try to refer her back to [facility].  Pain on scale of 1-10- 8 Frequency- daily What increases pain- walking, lifting, cold weather What makes pain Better-rest and medication Effects on ADL -moderate some days Any change in general medical condition-no  Effectiveness of current meds-not adequate Adverse reactions form pain meds-no PMP AWARE website reviewed: Yes Any suspicious activity on PMP Aware: No MME daily dose: 15  LME daily dose: 2  UDS 01/29/19 contract on file 01/13/19  History of overdose or risk of abuse no  Patient also needs several refills for her chronic conditions of hirsutism, iron deficiency anemia, weight gain.  Medications have been sent.  Past Medical History:  Diagnosis Date  . Anxiety   . Arthritis   . Asthma   . Bilateral calcaneal spurs   . Bilateral polycystic ovarian syndrome   . Bulging lumbar disc   . Carpal tunnel syndrome, bilateral   . COPD (chronic obstructive pulmonary disease) (HCC)    BRONCHITIS  . Diabetes mellitus without complication ([place])    TYPE 2   DX  4-5 YRS AGO  . ETD (Eustachian tube dysfunction),  bilateral    left worse than right  . Fibromyalgia   . GAD (generalized anxiety disorder)   . GERD (gastroesophageal reflux disease)    TAKES PRESCRIPTION MEDS  . Glaucoma    BOTH EYES  . Hyperlipidemia   . Hypertension   . Hypothyroidism    NODULES ON THYROID  . PONV (postoperative nausea and vomiting)   . Pulmonary embolism ([place])   . Tachycardia   . TMJ (dislocation of temporomandibular joint)   . Trigger finger   . Trigger finger of left thumb    Relevant past medical, surgical, family and social history reviewed and updated as indicated. Interim medical history since our last visit reviewed. Allergies and medications reviewed and updated. DATA REVIEWED: CHART IN  EPIC  Family History reviewed for pertinent findings.  Review of Systems  Constitutional: Negative.   HENT: Negative.   Eyes: Negative.   Respiratory: Negative.   Gastrointestinal: Negative.   Genitourinary: Negative.   Musculoskeletal: Positive for arthralgias, back pain, myalgias, neck pain and neck stiffness.  Skin: Positive for color change.    Allergies as of 04/07/2019      Reactions   Demerol [meperidine] Shortness Of Breath   FLUSHING AND SHORTNESS OF BREATH   Morphine And Related Shortness Of Breath   Flushed and hot hyper Flushed and hot hyper Flushed and hot hyper Flushed and hot hyper   Other Shortness Of Breath   Flushed and hot hyper Flushed and hot hyper Flushed and hot hyper Flushed and hot hyper Flushed and hot hyper   Cardizem  [diltiazem Hcl] Other (See Comments)   Diltiazem Hcl Hives   Lisinopril    Caused cough   Lyrica [pregabalin]    Chest pain, elevated heart rate   Tramadol Other (See Comments)   Tears up stomach      Medication List       Accurate as of Apr 07, 2019 12:44 PM. If you have any questions, ask your nurse or doctor.        STOP taking these medications   amoxicillin 500 MG capsule Commonly known as:  AMOXIL Stopped by:  Terald Sleeper, PA-C   Epiduo 0.1-2.5 % gel Generic drug:  Adapalene-Benzoyl Peroxide Stopped by:  Terald Sleeper, PA-C   norethindrone 0.35 MG tablet Commonly known as:  MICRONOR Stopped by:  Terald Sleeper, PA-C   permethrin 5 % cream Commonly known as:  ELIMITE Stopped by:  Terald Sleeper, PA-C   Plecanatide 3 MG Tabs Commonly known as:  Trulance Stopped by:  Terald Sleeper, PA-C   predniSONE 10 MG (21) Tbpk tablet Commonly known as:  STERAPRED UNI-PAK 21 TAB Stopped by:  Terald Sleeper, PA-C     TAKE these medications   adapalene 0.1 % cream Commonly known as:  Differin Apply topically at bedtime.   albuterol (2.5 MG/3ML) 0.083% nebulizer solution Commonly known as:  PROVENTIL Take 3 mLs  (2.5 mg total) by nebulization every 6 (six) hours as needed for wheezing or shortness of breath.   ProAir HFA 108 (90 Base) MCG/ACT inhaler Generic drug:  albuterol INHALE TWO PUFFS INTO THE LUNGS EVERY 6 HOURS AS NEEDED FOR WHEEZING OR SHORTNESS OF BREATH   ALPRAZolam 0.5 MG tablet Commonly known as:  XANAX TAKE 1 TABLET BY MOUTH TWICE DAILY AS NEEDED. MAY TAKE ONE MORE TABLET BEFORE FOR ALLERGY SHOTS ONCE A WEEK (30 DAY supply)   aspirin 81 MG chewable tablet Chew 81 mg by mouth daily.  Auvi-Q 0.3 mg/0.3 mL Soaj injection Generic drug:  EPINEPHrine Use as directed for life-threatening allergic reaction.   baclofen 10 MG tablet Commonly known as:  LIORESAL Take 1 tablet (10 mg total) by mouth 3 (three) times daily as needed.   budesonide-formoterol 80-4.5 MCG/ACT inhaler Commonly known as:  SYMBICORT Inhale 1-2 puffs into the lungs daily.   busPIRone 10 MG tablet Commonly known as:  BUSPAR Take 1 tablet (10 mg total) by mouth 3 (three) times daily.   Celacyn Gel Apply 1 application topically at bedtime. Started by:  Terald Sleeper, PA-C   cetirizine 10 MG tablet Commonly known as:  ZYRTEC TAKE 1 TABLET BY MOUTH TWICE DAILY   cromolyn 4 % ophthalmic solution Commonly known as:  OPTICROM Place 1 drop into both eyes 4 (four) times daily.   cycloSPORINE 0.05 % ophthalmic emulsion Commonly known as:  RESTASIS Place 1 drop into both eyes 2 (two) times daily.   diclofenac sodium 1 % Gel Commonly known as:  VOLTAREN APPLY TWO GRAMS TOPICALLY FOUR TIMES DAILY   Diethylpropion HCl CR 75 MG Tb24 Take 1 tablet (75 mg total) by mouth daily.   fluticasone 50 MCG/ACT nasal spray Commonly known as:  FLONASE 1-2 sprays in each nostril 3 times weekly as needed.   furosemide 20 MG tablet Commonly known as:  LASIX TAKE 1 TABLET BY MOUTH DAILY AS NEEDED FOR FLUID   HYDROcodone-acetaminophen 5-325 MG tablet Commonly known as:  Norco Take 1 tablet by mouth every 6 (six) hours  as needed for moderate pain.   HYDROcodone-acetaminophen 5-325 MG tablet Commonly known as:  Norco Take 1 tablet by mouth every 8 (eight) hours as needed for moderate pain or severe pain.   iron polysaccharides 150 MG capsule Commonly known as:  IFerex 150 Take 1 capsule (150 mg total) by mouth 2 (two) times daily.   Linzess 145 MCG Caps capsule Generic drug:  linaclotide Take 145 mcg by mouth 2 (two) times daily.   losartan 25 MG tablet Commonly known as:  COZAAR Take 1 tablet (25 mg total) by mouth 2 (two) times daily.   medroxyPROGESTERone 10 MG tablet Commonly known as:  PROVERA TAKE 1 TABLET BY MOUTH DAILY   metFORMIN 500 MG tablet Commonly known as:  GLUCOPHAGE TAKE 1 TABLET BY MOUTH EVERY DAY WITH BREAKFAST   montelukast 10 MG tablet Commonly known as:  SINGULAIR Take 1 tablet (10 mg total) by mouth at bedtime.   Nucala 100 MG Solr Generic drug:  Mepolizumab inject 100 mg under the skin every 4 weeks (to be given by a healthcare provider)   Olopatadine HCl 0.6 % Soln USE TWO SPRAYS IN BOTH NOSTRILS TWICE DAILY   pantoprazole 40 MG tablet Commonly known as:  PROTONIX Take 1 tablet (40 mg total) by mouth daily. What changed:  when to take this   potassium chloride SA 20 MEQ tablet Commonly known as:  K-DUR TAKE 1 TABLET BY MOUTH DAILY WHILE TAKING LASIX   pravastatin 20 MG tablet Commonly known as:  PRAVACHOL Take 1 tablet (20 mg total) by mouth daily. What changed:  when to take this   SM Complete Advanced Formula Tabs Take 1 tablet by mouth daily.   sterile water (preservative free) injection Inject 10 mLs as directed as directed.   Vaniqa 13.9 % cream Generic drug:  Eflornithine HCl APPLY TO AFFECTED AREA TWICE DAILY          Objective:    BP 121/85   Pulse  100   Temp 98.6 F (37 C) (Oral)   Ht 5\' 2"  (1.575 m)   Wt 140 lb 3.2 oz (63.6 kg)   BMI 25.64 kg/m   Allergies  Allergen Reactions  . Demerol [Meperidine] Shortness Of Breath     FLUSHING AND SHORTNESS OF BREATH  . Morphine And Related Shortness Of Breath    Flushed and hot hyper Flushed and hot hyper Flushed and hot hyper Flushed and hot hyper  . Other Shortness Of Breath    Flushed and hot hyper Flushed and hot hyper Flushed and hot hyper Flushed and hot hyper Flushed and hot hyper  . Cardizem  [Diltiazem Hcl] Other (See Comments)  . Diltiazem Hcl Hives  . Lisinopril     Caused cough  . Lyrica [Pregabalin]     Chest pain, elevated heart rate  . Tramadol Other (See Comments)    Tears up stomach    Wt Readings from Last 3 Encounters:  04/07/19 140 lb 3.2 oz (63.6 kg)  01/29/19 141 lb (64 kg)  01/12/19 140 lb 9.6 oz (63.8 kg)    Physical Exam Constitutional:      Appearance: She is well-developed.  HENT:     Head: Normocephalic and atraumatic.  Eyes:     Conjunctiva/sclera: Conjunctivae normal.     Pupils: Pupils are equal, round, and reactive to light.  Cardiovascular:     Rate and Rhythm: Normal rate and regular rhythm.     Heart sounds: Normal heart sounds.  Pulmonary:     Effort: Pulmonary effort is normal.     Breath sounds: Normal breath sounds.  Skin:    General: Skin is warm and dry.     Findings: No rash.  Neurological:     Mental Status: She is alert and oriented to person, place, and time.     Deep Tendon Reflexes: Reflexes are normal and symmetric.  Psychiatric:        Behavior: Behavior normal.        Thought Content: Thought content normal.        Judgment: Judgment normal.         Assessment & Plan:   1. Hirsutism - VANIQA 13.9 % cream; APPLY TO AFFECTED AREA TWICE DAILY  Dispense: 45 g; Refill: 5  2. S/P cervical spinal fusion - HYDROcodone-acetaminophen (NORCO) 5-325 MG tablet; Take 1 tablet by mouth every 6 (six) hours as needed for moderate pain.  Dispense: 90 tablet; Refill: 0 - HYDROcodone-acetaminophen (NORCO) 5-325 MG tablet; Take 1 tablet by mouth every 8 (eight) hours as needed for moderate pain or severe  pain.  Dispense: 90 tablet; Refill: 0 - Ambulatory referral to Pain Clinic  3. Iron deficiency anemia due to chronic blood loss - CBC with Differential/Platelet - Iron  4. Weight gain - Diethylpropion HCl CR 75 MG TB24; Take 1 tablet (75 mg total) by mouth daily.  Dispense: 30 each; Refill: 2  5. Spondylosis of lumbar spine - Ambulatory referral to Pain Clinic   Continue all other maintenance medications as listed above.  Follow up plan: Return in about 3 months (around 07/08/2019) for recheck.  Educational handout given for [place] PA-C [place] [address]  [place], [place] [address] [phone]   04/07/2019, 12:44 PM

## 2019-04-08 ENCOUNTER — Telehealth: Payer: Self-pay | Admitting: *Deleted

## 2019-04-08 ENCOUNTER — Other Ambulatory Visit: Payer: Self-pay | Admitting: Emergency Medicine

## 2019-04-08 DIAGNOSIS — R195 Other fecal abnormalities: Secondary | ICD-10-CM

## 2019-04-08 DIAGNOSIS — K5909 Other constipation: Secondary | ICD-10-CM

## 2019-04-08 DIAGNOSIS — D508 Other iron deficiency anemias: Secondary | ICD-10-CM

## 2019-04-08 LAB — CBC WITH DIFFERENTIAL/PLATELET
Basophils Absolute: 0 10*3/uL (ref 0.0–0.2)
Basos: 0 %
EOS (ABSOLUTE): 0.2 10*3/uL (ref 0.0–0.4)
Eos: 2 %
Hematocrit: 42.1 % (ref 34.0–46.6)
Hemoglobin: 14.2 g/dL (ref 11.1–15.9)
Immature Grans (Abs): 0 10*3/uL (ref 0.0–0.1)
Immature Granulocytes: 0 %
Lymphocytes Absolute: 1.7 10*3/uL (ref 0.7–3.1)
Lymphs: 24 %
MCH: 29.3 pg (ref 26.6–33.0)
MCHC: 33.7 g/dL (ref 31.5–35.7)
MCV: 87 fL (ref 79–97)
Monocytes Absolute: 0.5 10*3/uL (ref 0.1–0.9)
Monocytes: 7 %
Neutrophils Absolute: 4.7 10*3/uL (ref 1.4–7.0)
Neutrophils: 67 %
Platelets: 257 10*3/uL (ref 150–450)
RBC: 4.85 x10E6/uL (ref 3.77–5.28)
RDW: 17.6 % — ABNORMAL HIGH (ref 11.7–15.4)
WBC: 7.1 10*3/uL (ref 3.4–10.8)

## 2019-04-08 LAB — IRON: Iron: 42 ug/dL (ref 27–159)

## 2019-04-08 MED ORDER — NA SULFATE-K SULFATE-MG SULF 17.5-3.13-1.6 GM/177ML PO SOLN: 1.0000 | ORAL | 0 refills | Status: DC

## 2019-04-08 NOTE — Telephone Encounter (Signed)
Patient called back and states [employer] told her Auvi-Q needed a prior authorization. Will submit.

## 2019-04-08 NOTE — Telephone Encounter (Signed)
Patient states Auvi-Q is not covered by [employer]. She wants use to do a prior authorization. Told patient will work on [place].

## 2019-04-08 NOTE — Addendum Note (Signed)
Addended by: Wyline Beady on: 04/08/2019 09:28 AM   Modules accepted: Orders

## 2019-04-09 ENCOUNTER — Ambulatory Visit (INDEPENDENT_AMBULATORY_CARE_PROVIDER_SITE_OTHER): Payer: BLUE CROSS/BLUE SHIELD | Admitting: *Deleted

## 2019-04-09 DIAGNOSIS — J309 Allergic rhinitis, unspecified: Secondary | ICD-10-CM | POA: Diagnosis not present

## 2019-04-09 NOTE — Telephone Encounter (Signed)
PA submitted.

## 2019-04-13 ENCOUNTER — Ambulatory Visit (INDEPENDENT_AMBULATORY_CARE_PROVIDER_SITE_OTHER): Payer: BLUE CROSS/BLUE SHIELD | Admitting: *Deleted

## 2019-04-13 DIAGNOSIS — J4550 Severe persistent asthma, uncomplicated: Secondary | ICD-10-CM

## 2019-04-13 NOTE — Telephone Encounter (Signed)
Informed patient of denial.

## 2019-04-13 NOTE — Telephone Encounter (Signed)
PA was denied. Patient must have Mylan or TEVA brand generic.

## 2019-04-14 ENCOUNTER — Ambulatory Visit: Payer: Self-pay

## 2019-04-14 ENCOUNTER — Telehealth: Payer: Self-pay | Admitting: Physician Assistant

## 2019-04-14 ENCOUNTER — Telehealth: Payer: Self-pay

## 2019-04-14 DIAGNOSIS — H1013 Acute atopic conjunctivitis, bilateral: Secondary | ICD-10-CM | POA: Diagnosis not present

## 2019-04-14 DIAGNOSIS — J4550 Severe persistent asthma, uncomplicated: Secondary | ICD-10-CM | POA: Diagnosis not present

## 2019-04-14 NOTE — Telephone Encounter (Signed)
Spoke with patient.

## 2019-04-14 NOTE — Telephone Encounter (Signed)
Patient called to check on her referral to pain management at [facility].  I explained to her that I had sent all of her information to [facility] for review and had received a call back from them stating they could not see her because she had been dismissed from the practice and that I had sent all of her information to another pain clinic for review.  Patient is insistent that you contact the provider she saw at [facility], and explain to her what happened and ask them to reconsider seeing her again.  I explained to the patient that I had tried to get them to take her back as a patient but they refused.  She insists that you contact them and speak with Jeanella Anton.

## 2019-04-14 NOTE — Telephone Encounter (Signed)
I sent the whole encounter where we discussed this to the provider at [facility].  I do not think anything else will change the office's decision and policy.

## 2019-04-15 NOTE — Telephone Encounter (Signed)
Left message to call back  

## 2019-04-16 ENCOUNTER — Ambulatory Visit (INDEPENDENT_AMBULATORY_CARE_PROVIDER_SITE_OTHER): Payer: BLUE CROSS/BLUE SHIELD | Admitting: *Deleted

## 2019-04-16 DIAGNOSIS — H1013 Acute atopic conjunctivitis, bilateral: Secondary | ICD-10-CM | POA: Diagnosis not present

## 2019-04-16 DIAGNOSIS — J309 Allergic rhinitis, unspecified: Secondary | ICD-10-CM | POA: Diagnosis not present

## 2019-04-23 ENCOUNTER — Ambulatory Visit (INDEPENDENT_AMBULATORY_CARE_PROVIDER_SITE_OTHER): Payer: BLUE CROSS/BLUE SHIELD

## 2019-04-23 DIAGNOSIS — J309 Allergic rhinitis, unspecified: Secondary | ICD-10-CM

## 2019-04-26 ENCOUNTER — Other Ambulatory Visit: Payer: Self-pay | Admitting: Allergy and Immunology

## 2019-04-30 ENCOUNTER — Ambulatory Visit (INDEPENDENT_AMBULATORY_CARE_PROVIDER_SITE_OTHER): Payer: BC Managed Care – PPO | Admitting: *Deleted

## 2019-04-30 DIAGNOSIS — J309 Allergic rhinitis, unspecified: Secondary | ICD-10-CM

## 2019-05-04 ENCOUNTER — Telehealth: Payer: Self-pay | Admitting: Gastroenterology

## 2019-05-04 ENCOUNTER — Ambulatory Visit: Payer: Medicaid Other | Admitting: Allergy and Immunology

## 2019-05-04 NOTE — Telephone Encounter (Signed)

## 2019-05-05 ENCOUNTER — Encounter: Payer: Self-pay | Admitting: Gastroenterology

## 2019-05-05 ENCOUNTER — Other Ambulatory Visit: Payer: Self-pay

## 2019-05-05 ENCOUNTER — Ambulatory Visit (AMBULATORY_SURGERY_CENTER): Payer: BC Managed Care – PPO | Admitting: Gastroenterology

## 2019-05-05 ENCOUNTER — Telehealth: Payer: Self-pay | Admitting: Physician Assistant

## 2019-05-05 VITALS — BP 129/76 | HR 92 | Temp 98.5°F | Resp 15 | Ht 62.0 in | Wt 140.0 lb

## 2019-05-05 DIAGNOSIS — K648 Other hemorrhoids: Secondary | ICD-10-CM

## 2019-05-05 DIAGNOSIS — K317 Polyp of stomach and duodenum: Secondary | ICD-10-CM | POA: Diagnosis not present

## 2019-05-05 DIAGNOSIS — K319 Disease of stomach and duodenum, unspecified: Secondary | ICD-10-CM | POA: Diagnosis not present

## 2019-05-05 DIAGNOSIS — K599 Functional intestinal disorder, unspecified: Secondary | ICD-10-CM | POA: Diagnosis not present

## 2019-05-05 DIAGNOSIS — K5909 Other constipation: Secondary | ICD-10-CM | POA: Diagnosis not present

## 2019-05-05 DIAGNOSIS — D500 Iron deficiency anemia secondary to blood loss (chronic): Secondary | ICD-10-CM

## 2019-05-05 DIAGNOSIS — D509 Iron deficiency anemia, unspecified: Secondary | ICD-10-CM | POA: Diagnosis not present

## 2019-05-05 MED ORDER — SODIUM CHLORIDE 0.9 % IV SOLN: 500.0000 mL | Freq: Once | INTRAVENOUS | Status: DC

## 2019-05-05 NOTE — Patient Instructions (Signed)
Handout given for hemorrhoids.  YOU HAD AN ENDOSCOPIC PROCEDURE TODAY AT [facility]:   Refer to the procedure report that was given to you for any specific questions about what was found during the examination.  If the procedure report does not answer your questions, please call your gastroenterologist to clarify.  If you requested that your care partner not be given the details of your procedure findings, then the procedure report has been included in a sealed envelope for you to review at your convenience later.  YOU SHOULD EXPECT: Some feelings of bloating in the abdomen. Passage of more gas than usual.  Walking can help get rid of the air that was put into your GI tract during the procedure and reduce the bloating. If you had a lower endoscopy (such as a colonoscopy or flexible sigmoidoscopy) you may notice spotting of blood in your stool or on the toilet paper. If you underwent a bowel prep for your procedure, you may not have a normal bowel movement for a few days.  Please Note:  You might notice some irritation and congestion in your nose or some drainage.  This is from the oxygen used during your procedure.  There is no need for concern and it should clear up in a day or so.  SYMPTOMS TO REPORT IMMEDIATELY:   Following lower endoscopy (colonoscopy or flexible sigmoidoscopy):  Excessive amounts of blood in the stool  Significant tenderness or worsening of abdominal pains  Swelling of the abdomen that is new, acute  Fever of 100F or higher   Following upper endoscopy (EGD)  Vomiting of blood or coffee ground material  New chest pain or pain under the shoulder blades  Painful or persistently difficult swallowing  New shortness of breath  Fever of 100F or higher  Black, tarry-looking stools  For urgent or emergent issues, a gastroenterologist can be reached at any hour by calling [phone].   DIET:  We do recommend a small meal at first, but then you may  proceed to your regular diet.  Drink plenty of fluids but you should avoid alcoholic beverages for 24 hours.  ACTIVITY:  You should plan to take it easy for the rest of today and you should NOT DRIVE or use heavy machinery until tomorrow (because of the sedation medicines used during the test).    FOLLOW UP: Our staff will call the number listed on your records 48-72 hours following your procedure to check on you and address any questions or concerns that you may have regarding the information given to you following your procedure. If we do not reach you, we will leave a message.  We will attempt to reach you two times.  During this call, we will ask if you have developed any symptoms of COVID 19. If you develop any symptoms (ie: fever, flu-like symptoms, shortness of breath, cough etc.) before then, please call [phone].  If you test positive for Covid 19 in the 2 weeks post procedure, please call and report this information to [place].    If any biopsies were taken you will be contacted by phone or by letter within the next 1-3 weeks.  Please call us at [phone] if you have not heard about the biopsies in 3 weeks.    SIGNATURES/CONFIDENTIALITY: You and/or your care partner have signed paperwork which will be entered into your electronic medical record.  These signatures attest to the fact that that the information above on your After Visit Summary  has been reviewed and is understood.  Full responsibility of the confidentiality of this discharge information lies with you and/or your care-partner. 

## 2019-05-05 NOTE — Op Note (Signed)
[place] Patient Name: Jennifer Mullins Procedure Date: 05/05/2019 8:58 AM MRN: [id number] Endoscopist: Thornton Park MD, MD Age: 48 Referring MD:  Date of Birth: 11-Oct-1971 Gender: Female Account #: [id number] Procedure:                Upper GI endoscopy Indications:              Unexplained iron deficiency anemia                           Iron deficiency anemia without overt GI blood loss                           - longstanding history                           - history of non-compliance with iron supplements                           - FOBT negative                           - hemoglobin 7.3 01/13/19                           - hemoglobin 11.2 06/15/28                           - received 2 units PRBCs Medicines:                See the Anesthesia note for documentation of the                            administered medications Procedure:                Pre-Anesthesia Assessment:                           - Prior to the procedure, a History and Physical                            was performed, and patient medications and                            allergies were reviewed. The patient's tolerance of                            previous anesthesia was also reviewed. The risks                            and benefits of the procedure and the sedation                            options and risks were discussed with the patient.                            All questions were answered, and informed consent  was obtained. Prior Anticoagulants: The patient has                            taken no previous anticoagulant or antiplatelet                            agents. ASA Grade Assessment: II - A patient with                            mild systemic disease. After reviewing the risks                            and benefits, the patient was deemed in                            satisfactory condition to undergo the procedure.  After obtaining informed consent, the endoscope was                            passed under direct vision. Throughout the                            procedure, the patient's blood pressure, pulse, and                            oxygen saturations were monitored continuously. The                            Model GIF-HQ190 [id number]) scope was introduced                            through the mouth, and advanced to the third part                            of duodenum. The upper GI endoscopy was                            accomplished without difficulty. The patient                            tolerated the procedure well. Scope In: Scope Out: Findings:                 The esophagus was normal.                           A few small sessile polyps with no bleeding and no                            stigmata of recent bleeding were found in the                            gastric body.  The examined duodenum was normal. Biopsies for                            histology were taken with a cold forceps for                            evaluation of celiac disease. Estimated blood loss                            was minimal.                           The exam was otherwise without abnormality. Complications:            No immediate complications. Estimated blood loss:                            Minimal. Estimated Blood Loss:     Estimated blood loss was minimal. Impression:               - Normal esophagus.                           - A few gastric polyps. Suspected fundic gland                            polyps.                           - Normal examined duodenum. Biopsied.                           - The examination was otherwise normal. Recommendation:           - Patient has a contact number available for                            emergencies. The signs and symptoms of potential                            delayed complications were discussed with the                             patient. Return to normal activities tomorrow.                            Written discharge instructions were provided to the                            patient.                           - Resume regular diet today.                           - Continue present medications.                           -  Await pathology results.                           - Repeat upper endoscopy is not recommended given                            these results. Thornton Park MD, MD 05/05/2019 9:34:25 AM This report has been signed electronically.

## 2019-05-05 NOTE — Progress Notes (Signed)
Report to PACU, RN, vss, BBS= Clear.  

## 2019-05-05 NOTE — Op Note (Addendum)
[place] Patient Name: Jennifer Mullins Procedure Date: 05/05/2019 8:57 AM MRN: [id number] Endoscopist: Thornton Park MD, MD Age: 48 Referring MD:  Date of Birth: 09-Jun-1971 Gender: Female Account #: [id number] Procedure:                Colonoscopy Indications:              Unexplained iron deficiency anemia                           Iron deficiency anemia without overt GI blood loss                           - longstanding history                           - history of non-compliance with iron supplements                           - FOBT negative                           - hemoglobin 7.3 01/13/19                           - hemoglobin 11.2 06/15/28                           - received 2 units PRBCs                           Chronic constipation                           - previously on                           - currently on Linzess                           Menorrhagia                           - followed by Dr. Gari Crown                           - on Provera                           - D&C and keloid tissue removed from episiotomy                            11/2018                           - considering hysterectomy                           Hemorrhoids - treated with topical therapy by Dr.  Buist                           No prior colon cancer screeningIron deficiency                            anemia without overt GI blood loss Medicines:                See the Anesthesia note for documentation of the                            administered medications Procedure:                Pre-Anesthesia Assessment:                           - Prior to the procedure, a History and Physical                            was performed, and patient medications and                            allergies were reviewed. The patient's tolerance of                            previous anesthesia was also reviewed. The risks                            and  benefits of the procedure and the sedation                            options and risks were discussed with the patient.                            All questions were answered, and informed consent                            was obtained. Prior Anticoagulants: The patient has                            taken no previous anticoagulant or antiplatelet                            agents. ASA Grade Assessment: II - A patient with                            mild systemic disease. After reviewing the risks                            and benefits, the patient was deemed in                            satisfactory condition to undergo the procedure.  After obtaining informed consent, the colonoscope                            was passed under direct vision. Throughout the                            procedure, the patient's blood pressure, pulse, and                            oxygen saturations were monitored continuously. The                            Colonoscope was introduced through the anus and                            advanced to the the terminal ileum, with                            identification of the appendiceal orifice and IC                            valve. The colonoscopy was performed with moderate                            difficulty due to a redundant colon, significant                            looping and a tortuous colon. Successful completion                            of the procedure was aided by applying abdominal                            pressure. The patient tolerated the procedure well.                            The quality of the bowel preparation was good. The                            terminal ileum, ileocecal valve, appendiceal                            orifice, and rectum were photographed. Scope In: 9:16:41 AM Scope Out: 9:28:46 AM Scope Withdrawal Time: 0 hours 8 minutes 31 seconds  Total Procedure Duration: 0 hours 12 minutes 5  seconds  Findings:                 Hemorrhoids were found on perianal exam.                           Non-bleeding internal hemorrhoids were found. The                            hemorrhoids were small.  There is a nodular appearance to the cecum and                            ascending colon most consistent with prominant                            lymphoid aggregates. Biopsies were taken with a                            cold forceps for histology.                           The exam was otherwise without abnormality on                            direct and retroflexion views. Complications:            No immediate complications. Estimated blood loss:                            Minimal. Estimated Blood Loss:     Estimated blood loss was minimal. Impression:               - Hemorrhoids found on perianal exam.                           - Non-bleeding internal hemorrhoids.                           - Nodular mucosa in the ascending colon. Biopsied.                           - The examination was otherwise normal on direct                            and retroflexion views. Recommendation:           - Patient has a contact number available for                            emergencies. The signs and symptoms of potential                            delayed complications were discussed with the                            patient. Return to normal activities tomorrow.                            Written discharge instructions were provided to the                            patient.                           - Resume regular diet.                           -  Continue present medications.                           - Await pathology results.                           - Repeat colonoscopy in 10 years for screening                            purposes. Thornton Park MD, MD 05/05/2019 9:38:15 AM This report has been signed electronically.

## 2019-05-06 ENCOUNTER — Telehealth: Payer: Self-pay | Admitting: Gastroenterology

## 2019-05-06 NOTE — Telephone Encounter (Signed)
This RN can pass this information on to the patient and can also fax the patient's information to [facility] for a surgical referral.

## 2019-05-06 NOTE — Telephone Encounter (Signed)
She can just wit for the med to get back in stock. I do not write for the other version.  Plus this medication is only to be taken for about 3 months.

## 2019-05-06 NOTE — Telephone Encounter (Signed)
Pt aware of provider feedback and voiced understanding. 

## 2019-05-06 NOTE — Telephone Encounter (Signed)
Thank you :)

## 2019-05-06 NOTE — Telephone Encounter (Signed)
Spoke to the patient and told the patient that she would be referred to [facility] as a surgical referral. The patient verbalized understanding. Patient information faxed to [phone] to the attention of Dr. Nadeen Landau. Nothing further needed at the time of the call.

## 2019-05-06 NOTE — Telephone Encounter (Signed)
The patient called to ask if a "skin tag" or keloid was removed during her colonoscopy on 05/05/2019. The patient said that the keloid resulted from an episiotomy from a delivery that the patient had in the 90's. The patient said she inquired about the removal of this during the phone consultation with Dr. Tarri Glenn prior to the colonoscopy and was calling to see if it was removed. The patient wanted to speak to Dr. Tarri Glenn about this and this RN told the patient that Dr. Tarri Glenn typically had follow up appointments a few weeks after procedures and also when biopsy results came back (if a letter is was not sufficient enough) and to wait until then to have the conversation. FYI--

## 2019-05-06 NOTE — Telephone Encounter (Signed)
Thank you for the information. I do not have a way to remove skin tags or keloids at the time of a colonoscopy. We are able to evaluate and confirm the diagnosis. If she would like to have it removed, she would need to see a Psychologist, sport and exercise. I would recommend Dr. Nadeen Landau. However, I would be happy to refer her to the surgeon of her choice. Thank you.

## 2019-05-07 ENCOUNTER — Encounter: Payer: Self-pay | Admitting: Gastroenterology

## 2019-05-07 ENCOUNTER — Telehealth: Payer: Self-pay

## 2019-05-07 NOTE — Telephone Encounter (Signed)
Second post procedure follow up call, no answer 

## 2019-05-07 NOTE — Telephone Encounter (Signed)
First post procedure follow up call, no answer 

## 2019-05-11 ENCOUNTER — Ambulatory Visit: Payer: Self-pay | Admitting: Allergy and Immunology

## 2019-05-11 ENCOUNTER — Other Ambulatory Visit: Payer: Self-pay

## 2019-05-11 ENCOUNTER — Telehealth: Payer: Self-pay | Admitting: Gastroenterology

## 2019-05-11 ENCOUNTER — Ambulatory Visit (INDEPENDENT_AMBULATORY_CARE_PROVIDER_SITE_OTHER): Payer: BC Managed Care – PPO | Admitting: *Deleted

## 2019-05-11 DIAGNOSIS — J4550 Severe persistent asthma, uncomplicated: Secondary | ICD-10-CM

## 2019-05-11 NOTE — Telephone Encounter (Signed)
Spoke to the patient, reiterated the results previous talked about. Reiterated [facility] referral information. Reiterated follow up visit date and time and the reason why Dr. Tarri Glenn wanted to follow up. Patient verbalized understanding. Nothing further at the time of the call.

## 2019-05-11 NOTE — Telephone Encounter (Signed)
Patient called she said someone called her on Friday with the pathology results but had to switch phones. She would like to know if someone can go over her results again. Her call back [phone].. she would like a call back before 10:30 b/c her husband is going to have a phone visit with his doctor.

## 2019-05-12 ENCOUNTER — Other Ambulatory Visit: Payer: Self-pay | Admitting: Physician Assistant

## 2019-05-14 ENCOUNTER — Ambulatory Visit (INDEPENDENT_AMBULATORY_CARE_PROVIDER_SITE_OTHER): Payer: BC Managed Care – PPO | Admitting: *Deleted

## 2019-05-14 DIAGNOSIS — J309 Allergic rhinitis, unspecified: Secondary | ICD-10-CM | POA: Diagnosis not present

## 2019-05-18 ENCOUNTER — Ambulatory Visit (INDEPENDENT_AMBULATORY_CARE_PROVIDER_SITE_OTHER): Payer: BC Managed Care – PPO | Admitting: Allergy and Immunology

## 2019-05-18 ENCOUNTER — Other Ambulatory Visit: Payer: Self-pay

## 2019-05-18 ENCOUNTER — Encounter: Payer: Self-pay | Admitting: Allergy and Immunology

## 2019-05-18 DIAGNOSIS — H1044 Vernal conjunctivitis: Secondary | ICD-10-CM | POA: Diagnosis not present

## 2019-05-18 DIAGNOSIS — H1010 Acute atopic conjunctivitis, unspecified eye: Secondary | ICD-10-CM | POA: Diagnosis not present

## 2019-05-18 DIAGNOSIS — K219 Gastro-esophageal reflux disease without esophagitis: Secondary | ICD-10-CM

## 2019-05-18 DIAGNOSIS — J4550 Severe persistent asthma, uncomplicated: Secondary | ICD-10-CM

## 2019-05-18 DIAGNOSIS — J3089 Other allergic rhinitis: Secondary | ICD-10-CM

## 2019-05-18 NOTE — Progress Notes (Signed)
Patient is at home. Provider is in office. Start Time: 451 pm.  End Time:505

## 2019-05-18 NOTE — Patient Instructions (Addendum)
  1. Continue Immunotherapy and Epi-Pen   2. Continue to Treat and prevent inflammation:   A.  Flonase- 1 spray each nostril 3-7 times per week  B.  montelukast 10 mg - one tablet one time per day  C.  Nucala injections every 4 weeks  D.  Symbicort 80 - 2 inhalations 3-7 times per week  3. Continue to Treat and prevent reflux:   A. pantoprazole 40 mg in a.m.  4. If needed:   A. Patanase 2 sprays each nostril two times per day  B. OTC antihistamine - Zyrtec 10mg  1-2 times per day  C. Proventil HFA or similar 2 inhalations every 4-6 hours  5. Continue eye therapy with Dr. Delman Cheadle.  6. Return in 6 months or earlier if problem  7. Obtain flu vaccine

## 2019-05-18 NOTE — Progress Notes (Signed)
[place]   Follow-up Note  Referring Provider: Eustaquio Maize, MD Primary Provider: Theodoro Clock Date of Office Visit: 05/18/2019  Subjective:   Jennifer Mullins (DOB: 07/10/1971) is a 48 y.o. female who returns to the [facility] on 05/18/2019 in re-evaluation of the following:  HPI: This is a E-Med visit requested by patient who is located at home.  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  Her last visit to this clinic was 27 October 2018.  Still with immunotherapy. Every week administration. Occasional large local reaction. No other adverse effects  Asthma very good. SABA use about 1 time per week. No systemic steroid use.  Nose and eyes doing well. Past few days some nasal congestion and some scratchy eyes and headache without any fever. Still sees Dr. Delman Cheadle for eye care. No antibiotic use for sinusitis.  Admitted for treatment of anemia February 2020. Had D/C performed.  Had upper endoscopy and colonoscopy last week.   Allergies as of 05/18/2019      Reactions   Demerol [meperidine] Shortness Of Breath   FLUSHING AND SHORTNESS OF BREATH   Morphine And Related Shortness Of Breath   Flushed and hot hyper Flushed and hot hyper Flushed and hot hyper Flushed and hot hyper   Other Shortness Of Breath   Flushed and hot hyper Flushed and hot hyper Flushed and hot hyper Flushed and hot hyper Flushed and hot hyper   Cardizem  [diltiazem Hcl] Other (See Comments)   Diltiazem Hcl Hives   Lisinopril    Caused cough   Lyrica [pregabalin]    Chest pain, elevated heart rate   Tramadol Other (See Comments)   Tears up stomach      Medication List      adapalene 0.1 % cream Commonly known as: Differin Apply topically at bedtime.    ALPRAZolam 0.5 MG tablet Commonly known as: XANAX TAKE 1 TABLET BY MOUTH TWICE DAILY AS NEEDED. MAY TAKE ONE MORE TABLET BEFORE FOR ALLERGY SHOTS ONCE A WEEK (30 DAY supply)   aspirin 81 MG chewable tablet Chew 81 mg by mouth daily.   Auvi-Q 0.3 mg/0.3 mL Soaj injection Generic drug: EPINEPHrine Use as directed for life-threatening allergic reaction.   baclofen 10 MG tablet Commonly known as: LIORESAL Take 1 tablet (10 mg total) by mouth 3 (three) times daily as needed.   budesonide-formoterol 80-4.5 MCG/ACT inhaler Commonly known as: SYMBICORT Inhale 1-2 puffs into the lungs daily.   busPIRone 10 MG tablet Commonly known as: BUSPAR Take 1 tablet (10 mg total) by mouth 3 (three) times daily. What changed:   how much to take  when to take this   Celacyn Gel Apply 1 application topically at bedtime.   cetirizine 10 MG tablet Commonly known as: ZYRTEC TAKE 1 TABLET BY MOUTH TWICE DAILY   cromolyn 4 % ophthalmic solution Commonly known as: OPTICROM Place 1 drop into both eyes 4 (four) times daily.   cycloSPORINE 0.05 % ophthalmic emulsion Commonly known as: RESTASIS Place 1 drop into both eyes 2 (two) times daily.   diclofenac sodium 1 % Gel Commonly known as: VOLTAREN APPLY TWO GRAMS TOPICALLY FOUR TIMES DAILY   Diethylpropion HCl CR 75 MG Tb24 Take 1 tablet (75 mg total) by mouth daily.  fluticasone 50 MCG/ACT nasal spray Commonly known as: FLONASE 1-2 sprays in each nostril 3 times weekly as needed.   furosemide 20 MG tablet Commonly known as: LASIX TAKE 1 TABLET BY MOUTH DAILY AS NEEDED FOR FLUID   HYDROcodone-acetaminophen 5-325 MG tablet Commonly known as: Norco Take 1 tablet by mouth every 6 (six) hours as needed for moderate pain.   iron polysaccharides 150 MG capsule Commonly known as: IFerex 150 Take 1 capsule (150 mg total) by mouth 2 (two) times daily.   Linzess 145 MCG Caps capsule Generic drug: linaclotide Take 145 mcg by mouth 2  (two) times daily.   losartan 25 MG tablet Commonly known as: COZAAR Take 1 tablet (25 mg total) by mouth 2 (two) times daily.   medroxyPROGESTERone 10 MG tablet Commonly known as: PROVERA TAKE 1 TABLET BY MOUTH DAILY   metFORMIN 500 MG tablet Commonly known as: GLUCOPHAGE TAKE 1 TABLET BY MOUTH EVERY DAY WITH BREAKFAST   montelukast 10 MG tablet Commonly known as: SINGULAIR TAKE 1 TABLET BY MOUTH AT BEDTIME   Nucala 100 MG Solr Generic drug: Mepolizumab inject 100 mg under the skin every 4 weeks (to be given by a healthcare provider)   Olopatadine HCl 0.6 % Soln USE TWO SPRAYS IN BOTH NOSTRILS TWICE DAILY   pantoprazole 40 MG tablet Commonly known as: PROTONIX Take 1 tablet (40 mg total) by mouth daily. What changed: when to take this   potassium chloride SA 20 MEQ tablet Commonly known as: K-DUR TAKE 1 TABLET BY MOUTH DAILY WHILE TAKING LASIX   pravastatin 20 MG tablet Commonly known as: PRAVACHOL Take 1 tablet (20 mg total) by mouth daily. What changed: when to take this   ProAir HFA 108 (90 Base) MCG/ACT inhaler Generic drug: albuterol INHALE TWO PUFFS INTO THE LUNGS EVERY 6 HOURS AS NEEDED FOR WHEEZING OR SHORTNESS OF BREATH   albuterol (2.5 MG/3ML) 0.083% nebulizer solution Commonly known as: PROVENTIL INHALE THE CONTENTS OF ONE VIAL EVERY 6 HOURS AS NEEDED FOR WHEEZING OR SHORTNESS OF BREATH   SM Complete Advanced Formula Tabs Take 1 tablet by mouth daily.   sterile water (preservative free) injection Inject 10 mLs as directed as directed.   Vaniqa 13.9 % cream Generic drug: Eflornithine HCl APPLY TO AFFECTED AREA TWICE DAILY       Past Medical History:  Diagnosis Date  . Anxiety   . Arthritis   . Asthma   . Bilateral calcaneal spurs   . Bilateral polycystic ovarian syndrome   . Bulging lumbar disc   . Carpal tunnel syndrome, bilateral   . COPD (chronic obstructive pulmonary disease) (HCC)    BRONCHITIS  . Diabetes mellitus without  complication ([place])    TYPE 2   DX  4-5 YRS AGO  . ETD (Eustachian tube dysfunction), bilateral    left worse than right  . Fibromyalgia   . GAD (generalized anxiety disorder)   . GERD (gastroesophageal reflux disease)    TAKES PRESCRIPTION MEDS  . Glaucoma    BOTH EYES  . Hyperlipidemia   . Hypertension   . Hypothyroidism    NODULES ON THYROID  . PONV (postoperative nausea and vomiting)   . Pulmonary embolism ([place])   . Tachycardia   . TMJ (dislocation of temporomandibular joint)   . Trigger finger   . Trigger finger of left thumb     Past Surgical History:  Procedure Laterality Date  . ANTERIOR CERVICAL DECOMP/DISCECTOMY FUSION N/A 11/13/2016   Procedure: Cervical five-six, Cervical  six-seven Anterior Cervical Discectomy and Fusion, Allograft, Plate;  Surgeon: Marybelle Killings, MD;  Location: [facility];  Service: Orthopedics;  Laterality: N/A;  . BREAST SURGERY    . CARPAL TUNNEL RELEASE    . DILATION AND CURETTAGE OF UTERUS    . EXPLORATORY LAPAROTOMY    . LIPOMA EXCISION Left 06/15/2018   Procedure: LEFT CALF LIPOMA EXCISION;  Surgeon: Marybelle Killings, MD;  Location: [place];  Service: Orthopedics;  Laterality: Left;  . NASAL SINUS SURGERY  07/22/13   Dr. Redmond Pulling in [place]  . plate and six screws in back    . TRIGGER FINGER RELEASE Right 06/15/2018   Procedure: RIGHT TRIGGER THUMB RELEASE;  Surgeon: Marybelle Killings, MD;  Location: [place];  Service: Orthopedics;  Laterality: Right;    Review of systems negative except as noted in HPI / PMHx or noted below:  Review of Systems  Constitutional: Negative.   HENT: Negative.   Eyes: Negative.   Respiratory: Negative.   Cardiovascular: Negative.   Gastrointestinal: Negative.   Genitourinary: Negative.   Musculoskeletal: Negative.   Skin: Negative.   Neurological: Negative.   Endo/Heme/Allergies: Negative.   Psychiatric/Behavioral: Negative.      Objective:   There were no vitals filed for this  visit.        Physical Exam-deferred  Diagnostics:   The patient had an Asthma Control Test with the following results: ACT Total Score: 20.    Assessment and Plan:   1. Asthma, severe persistent, well-controlled   2. Perennial allergic rhinitis   3. Seasonal allergic conjunctivitis   4. Vernal conjunctivitis of both eyes   5. LPRD (laryngopharyngeal reflux disease)     Patient Instructions   1. Continue Immunotherapy and Epi-Pen   2. Continue to Treat and prevent inflammation:   A.  Flonase- 1 spray each nostril 3-7 times per week  B.  montelukast 10 mg - one tablet one time per day  C.  Nucala injections every 4 weeks  D.  Symbicort 80 - 2 inhalations 3-7 times per week  3. Continue to Treat and prevent reflux:   A. pantoprazole 40 mg in a.m.  4. If needed:   A. Patanase 2 sprays each nostril two times per day  B. OTC antihistamine - Zyrtec 10mg  1-2 times per day  C. Proventil HFA or similar 2 inhalations every 4-6 hours  5. Continue eye therapy with Dr. Delman Cheadle.  6. Return in 6 months or earlier if problem  7. Obtain flu vaccine   Suvi sounds as though she is doing very well and we are not going to change her medications.  She will maintain therapy with medications directed against inflammation of her airway and also reflux.  She will continue with therapy prescribed by Dr. Delman Cheadle regarding her eye issue.  I will see her back in this clinic in 6 months or earlier if there is a problem.  Allena Katz, MD Allergy / Immunology [place]

## 2019-05-26 ENCOUNTER — Ambulatory Visit (INDEPENDENT_AMBULATORY_CARE_PROVIDER_SITE_OTHER): Payer: BC Managed Care – PPO | Admitting: *Deleted

## 2019-05-26 ENCOUNTER — Telehealth: Payer: Self-pay | Admitting: Physician Assistant

## 2019-05-26 DIAGNOSIS — J309 Allergic rhinitis, unspecified: Secondary | ICD-10-CM | POA: Diagnosis not present

## 2019-05-26 NOTE — Telephone Encounter (Signed)
FYI i reached out the the [facility] center for pain management they do not accept patients insurance.

## 2019-05-27 ENCOUNTER — Telehealth: Payer: Self-pay | Admitting: *Deleted

## 2019-05-27 ENCOUNTER — Ambulatory Visit (INDEPENDENT_AMBULATORY_CARE_PROVIDER_SITE_OTHER): Payer: BC Managed Care – PPO | Admitting: Gastroenterology

## 2019-05-27 ENCOUNTER — Encounter: Payer: Self-pay | Admitting: Gastroenterology

## 2019-05-27 ENCOUNTER — Other Ambulatory Visit: Payer: Self-pay

## 2019-05-27 VITALS — Ht 62.0 in | Wt 140.0 lb

## 2019-05-27 DIAGNOSIS — D500 Iron deficiency anemia secondary to blood loss (chronic): Secondary | ICD-10-CM

## 2019-05-27 MED ORDER — PREDNISONE 10 MG PO TABS: 10.0000 mg | ORAL_TABLET | Freq: Every day | ORAL | 0 refills | Status: AC

## 2019-05-27 NOTE — Telephone Encounter (Signed)
Please advise 

## 2019-05-27 NOTE — Telephone Encounter (Signed)
Left message for patient to call office.  

## 2019-05-27 NOTE — Telephone Encounter (Signed)
Patient would like a call back from [place]. Pt stated she had a phone visit with Kozlow on 6/30 and states she is not any better at all. Her ears and eyes are stopped up. Pt has a sinus headache and her face is puffy around her cheeks. She says that it'll go away and come right back. Patient states that she waited to get her shot like he recommended but finally came in for her shot yesterday and it made things way worse. Pt is wanting to know what to do.

## 2019-05-27 NOTE — Telephone Encounter (Signed)
Informed patient of Prednisone. Script sent into pharmacy.

## 2019-05-27 NOTE — Progress Notes (Signed)
TELEHEALTH VISIT  Referring Provider: Terald Sleeper, PA-C Primary Care Physician:  Terald Sleeper, PA-C   Tele-visit due to COVID-19 pandemic Patient requested visit virtually, consented to the encounter telephone call Patient seems aware of limitations, risks, security and privacy concerns of performing an evaluation and management service by telephone and the ongoing risk and availability of in-person appointments (Doximity, converted to audio due to failed sound during the Doximity call) Contact made at: 14:00 05/27/19 Patient verified by name and date of birth Location of patient: Home Location provider: My medical office Names of persons participating: Me, patient, Tinnie Gens CMA Time spent on call: 30 minutes   Reason for Consultation:  Anemia   IMPRESSION:  Iron deficiency anemia without overt GI blood loss    - FOBT negative    - hemoglobin 7.3 01/13/19    - hemoglobin 11.2 06/15/28    - received 2 units PRBCs     - iron infusion 2018 with Dr. Irene Limbo    - no source on EGD or colonoscopy 05/05/19    - normal duodenal biopsies 05/05/19 Chronic constipation    - previously on Trulance    - currently on Linzess with one BM QD to QOD Menorrhagia    - followed by Dr. Gari Crown    - on Provera    - D&C and keloid tissue removed from episiotomy 11/2018    - considering hysterectomy Hemorrhoids - treated with topical therapy by Dr. Evie Lacks Normal screening colonoscopy 05/15/19    - next colonoscopy due 2027, or earlier with symptoms  No source for iron deficiency identified on EGD or colonoscopy. Although menorrhagia is likely contributing if not the main source, must exclude GI sources. Capsule endoscopy recommended. Will repeat iron studies.   Continue Linzess. Encouraged adequate fiber and water in the diet.   PLAN: Continue iron supplements Iron, ferritin, transferring saturation, and CBC Capsule endoscopy Continue Linzess Next colonoscopy due 2027, or earlier  with symptoms Follow-up encounter two weeks after the capsule endoscopy to review the results  I have discussed the risks, benefits, and alternatives to capsule endoscopy and our goal to evaluate the small intestine.It s not intended to examine the esophagus, stomach or colon.It is a visual examination only and no biopsies may be taken with this technology. We discussed the potential risks that include, but are not limited to, bowel obstruction that may ultimately require surgery and aspiration resulting in pneumonia.  I instructed the patient to avoid MRI testing until the capsule is excreted. Due to variations in intestinal motility, the capsule may only image part of the small intestine. It is also possible that due to interferences, some images may be lost and this may result in the need to repeat the Capsule Endoscopy.  Capsule endoscopy is not a perfect study. Abnormalities, including cancer, might not be seen in some cases.  HPI: Jennifer Mullins is a 48 y.o. female has been under evaluation for anemia. Initial consultation with me 02/11/19. Endoscopy was performed 05/05/19.  The interval history is obtained through the patient and review of her electronic health record.  History of upper extremity thrombus. On Eliquis until she had severe vaginal bleeding. Replaced Eliquis with aspirin 81 mg daily.  Had a cervical spine fusion requiring chronic hydrocodone, anxiety on buspar, COPD/asthma, fibromyalgia, toxic multinodular goiter.    Labs 01/13/19: hgb 7.3, MCV 75, RDW 19, iron 18, ferritin 4 Labs 01/14/19: hgb 10.1, MCV 76, RDW 19.4 Labs 01/29/19: hgb 12.6, MCV  81, RDW 25.1, iron 87, ferritin 4 Labs 02/03/19: FOBT negative Labs 04/07/19: hgb 14.2, MCV 87, RDW 17.6, iron 42  Colonoscopy showed small internal hemorrhoids. There was a nodular appearance to the cecum and ascending colon most consistent with prominant lymphoid aggregates. Biopsies confirmed normal colonic mucosa.  EGD was normal except for  small fundic gland polyps.   Duodenal and colon biopsies were normal.  She continues to have heavy menses. Reviewed with Dr. Evie Lacks and resumed Provera at that time. Hoping that she may be a candidate for robotic surgery.   She resumed the Linzess for constipation worsened by oral iron replacements. She has a bowel movement QD to QOD. She is also eating two Activa yogurts daily. Uses stool softener or prune juice when needed. No other associated symptoms. No identified exacerbating or relieving features.   Craves ice when her hemoglobin is low. No recent pica.  Last PRBC transfusion 12/2018. Last IV iron 2018.   No overt GI blood loss except for rare rectal bleeding attributed to hemorrhoids. No melena, hematochezia. No epistaxis, hemoptysis, or hematuria. No identified exacerbating or relieving features.  She has an upcoming appointment with Dr. Dema Severin to evaluate a possible skin tag.     Past Medical History:  Diagnosis Date  . Anxiety   . Arthritis   . Asthma   . Bilateral calcaneal spurs   . Bilateral polycystic ovarian syndrome   . Bulging lumbar disc   . Carpal tunnel syndrome, bilateral   . COPD (chronic obstructive pulmonary disease) (HCC)    BRONCHITIS  . Diabetes mellitus without complication ([place])    TYPE 2   DX  4-5 YRS AGO  . ETD (Eustachian tube dysfunction), bilateral    left worse than right  . Fibromyalgia   . GAD (generalized anxiety disorder)   . GERD (gastroesophageal reflux disease)    TAKES PRESCRIPTION MEDS  . Glaucoma    BOTH EYES  . Hyperlipidemia   . Hypertension   . Hypothyroidism    NODULES ON THYROID  . PONV (postoperative nausea and vomiting)   . Pulmonary embolism ([place])   . Tachycardia   . TMJ (dislocation of temporomandibular joint)   . Trigger finger   . Trigger finger of left thumb     Past Surgical History:  Procedure Laterality Date  . ANTERIOR CERVICAL DECOMP/DISCECTOMY FUSION N/A 11/13/2016   Procedure: Cervical five-six,  Cervical six-seven Anterior Cervical Discectomy and Fusion, Allograft, Plate;  Surgeon: Marybelle Killings, MD;  Location: [place] [facility];  Service: Orthopedics;  Laterality: N/A;  . BREAST SURGERY    . CARPAL TUNNEL RELEASE    . DILATION AND CURETTAGE OF UTERUS    . EXPLORATORY LAPAROTOMY    . LIPOMA EXCISION Left 06/15/2018   Procedure: LEFT CALF LIPOMA EXCISION;  Surgeon: Marybelle Killings, MD;  Location: [facility];  Service: Orthopedics;  Laterality: Left;  . NASAL SINUS SURGERY  07/22/13   Dr. Redmond Pulling in [place]  . plate and six screws in back    . TRIGGER FINGER RELEASE Right 06/15/2018   Procedure: RIGHT TRIGGER THUMB RELEASE;  Surgeon: Marybelle Killings, MD;  Location: [place];  Service: Orthopedics;  Laterality: Right;    Current Outpatient Medications  Medication Sig Dispense Refill  . adapalene (DIFFERIN) 0.1 % cream Apply topically at bedtime. 45 g 5  . albuterol (PROVENTIL) (2.5 MG/3ML) 0.083% nebulizer solution INHALE THE CONTENTS OF ONE VIAL EVERY 6 HOURS AS NEEDED FOR WHEEZING OR SHORTNESS OF  BREATH 1080 mL 1  . ALPRAZolam (XANAX) 0.5 MG tablet TAKE 1 TABLET BY MOUTH TWICE DAILY AS NEEDED. MAY TAKE ONE MORE TABLET BEFORE FOR ALLERGY SHOTS ONCE A WEEK (30 DAY supply) 65 tablet 5  . aspirin 81 MG chewable tablet Chew 81 mg by mouth daily.    Marland Kitchen AUVI-Q 0.3 MG/0.3ML SOAJ injection Use as directed for life-threatening allergic reaction. 2 Device 3  . baclofen (LIORESAL) 10 MG tablet Take 1 tablet (10 mg total) by mouth 3 (three) times daily as needed. 30 each 1  . budesonide-formoterol (SYMBICORT) 80-4.5 MCG/ACT inhaler Inhale 1-2 puffs into the lungs daily. 1 Inhaler 5  . busPIRone (BUSPAR) 10 MG tablet Take 1 tablet (10 mg total) by mouth 3 (three) times daily. (Patient taking differently: Take 5 mg by mouth daily. ) 90 tablet 3  . cetirizine (ZYRTEC) 10 MG tablet TAKE 1 TABLET BY MOUTH TWICE DAILY 60 tablet 2  . cromolyn (OPTICROM) 4 % ophthalmic solution Place 1 drop into  both eyes 4 (four) times daily.   1  . cycloSPORINE (RESTASIS) 0.05 % ophthalmic emulsion Place 1 drop into both eyes 2 (two) times daily.    . diclofenac sodium (VOLTAREN) 1 % GEL APPLY TWO GRAMS TOPICALLY FOUR TIMES DAILY 300 g 5  . Diethylpropion HCl CR 75 MG TB24 Take 1 tablet (75 mg total) by mouth daily. 30 each 2  . fluticasone (FLONASE) 50 MCG/ACT nasal spray 1-2 sprays in each nostril 3 times weekly as needed. 16 g 5  . furosemide (LASIX) 20 MG tablet TAKE 1 TABLET BY MOUTH DAILY AS NEEDED FOR FLUID 90 tablet 3  . HYDROcodone-acetaminophen (NORCO) 5-325 MG tablet Take 1 tablet by mouth every 6 (six) hours as needed for moderate pain. 90 tablet 0  . iron polysaccharides (IFEREX 150) 150 MG capsule Take 1 capsule (150 mg total) by mouth 2 (two) times daily. 60 capsule 5  . LINZESS 145 MCG CAPS capsule Take 145 mcg by mouth 2 (two) times daily.    Marland Kitchen losartan (COZAAR) 25 MG tablet Take 1 tablet (25 mg total) by mouth 2 (two) times daily. 180 tablet 3  . medroxyPROGESTERone (PROVERA) 10 MG tablet TAKE 1 TABLET BY MOUTH DAILY 30 tablet 11  . metFORMIN (GLUCOPHAGE) 500 MG tablet TAKE 1 TABLET BY MOUTH EVERY DAY WITH BREAKFAST 90 tablet 1  . montelukast (SINGULAIR) 10 MG tablet TAKE 1 TABLET BY MOUTH AT BEDTIME 30 tablet 0  . Multiple Vitamins-Minerals (SM COMPLETE ADVANCED FORMULA) TABS Take 1 tablet by mouth daily.     Marland Kitchen NUCALA 100 MG SOLR inject 100 mg under the skin every 4 weeks (to be given by a healthcare provider) 1 each 10  . Olopatadine HCl 0.6 % SOLN USE TWO SPRAYS IN BOTH NOSTRILS TWICE DAILY 30.5 g 5  . pantoprazole (PROTONIX) 40 MG tablet Take 1 tablet (40 mg total) by mouth daily. (Patient taking differently: Take 40 mg by mouth every morning. ) 90 tablet 1  . potassium chloride SA (K-DUR,KLOR-CON) 20 MEQ tablet TAKE 1 TABLET BY MOUTH DAILY WHILE TAKING LASIX 90 tablet 1  . pravastatin (PRAVACHOL) 20 MG tablet Take 1 tablet (20 mg total) by mouth daily. (Patient taking differently:  Take 20 mg by mouth at bedtime. ) 90 tablet 1  . PROAIR HFA 108 (90 Base) MCG/ACT inhaler INHALE TWO PUFFS INTO THE LUNGS EVERY 6 HOURS AS NEEDED FOR WHEEZING OR SHORTNESS OF BREATH 8.5 g 3  . Scar Treatment Products (CELACYN) GEL  Apply 1 application topically at bedtime. 28 g 5  . VANIQA 13.9 % cream APPLY TO AFFECTED AREA TWICE DAILY 45 g 5  . Water For Injection Sterile (STERILE WATER, PRESERVATIVE FREE,) injection Inject 10 mLs as directed as directed. 10 mL 11   Current Facility-Administered Medications  Medication Dose Route Frequency Provider Last Rate Last Dose  . Mepolizumab SOLR 100 mg  100 mg Subcutaneous Q28 days Jiles Prows, MD   100 mg at 05/11/19 1530    Allergies as of 05/27/2019 - Review Complete 05/27/2019  Allergen Reaction Noted  . Demerol [meperidine] Shortness Of Breath 07/28/2015  . Morphine and related Shortness Of Breath 02/24/2012  . Other Shortness Of Breath 02/24/2012  . Cardizem  [diltiazem hcl] Other (See Comments) 09/23/2018  . Diltiazem hcl Hives 02/24/2012  . Lisinopril  01/20/2017  . Lyrica [pregabalin]  09/26/2017  . Tramadol Other (See Comments) 04/04/2017    Family History  Problem Relation Age of Onset  . Hypertension Mother   . Hypertension Father   . Colon cancer Neg Hx   . Rectal cancer Neg Hx   . Stomach cancer Neg Hx   . Esophageal cancer Neg Hx     Social History   Socioeconomic History  . Marital status: Married    Spouse name: Not on file  . Number of children: Not on file  . Years of education: Not on file  . Highest education level: Not on file  Occupational History  . Occupation: Unemployed  Social Needs  . Financial resource strain: Not on file  . Food insecurity    Worry: Not on file    Inability: Not on file  . Transportation needs    Medical: Not on file    Non-medical: Not on file  Tobacco Use  . Smoking status: Former Smoker    Packs/day: 0.25    Years: 5.00    Pack years: 1.25    Types: Cigarettes     Quit date: 11/18/1989    Years since quitting: 29.5  . Smokeless tobacco: Never Used  Substance and Sexual Activity  . Alcohol use: No  . Drug use: No  . Sexual activity: Yes    Birth control/protection: None  Lifestyle  . Physical activity    Days per week: Not on file    Minutes per session: Not on file  . Stress: Not on file  Relationships  . Social Herbalist on phone: Not on file    Gets together: Not on file    Attends religious service: Not on file    Active member of club or organization: Not on file    Attends meetings of clubs or organizations: Not on file    Relationship status: Not on file  . Intimate partner violence    Fear of current or ex partner: Not on file    Emotionally abused: Not on file    Physically abused: Not on file    Forced sexual activity: Not on file  Other Topics Concern  . Not on file  Social History Narrative  . Not on file    Physical Exam: General: in no acute distress Neuro: Alert and appropriate Psych: Normal affect and normal insight   Kimberly L. Tarri Glenn, MD, MPH [facility] 05/27/2019, 1:55 PM

## 2019-05-27 NOTE — Telephone Encounter (Signed)
Please inform patient that she probably has a viral respiratory tract infection and we will treat her conservatively with prednisone 10 mg daily for 5 days only to help with the inflammation tied up with this issue.  She can keep in contact with [place] noting her response to this approach.  At this point we will hold off on any antibiotics.

## 2019-05-27 NOTE — Patient Instructions (Addendum)
I have recommend some repeat labs to follow-up on your iron.   Continue to take Linzess.  I have recommended a capsule endoscopy to complete the evaluation of causes of blood loss from your GI tract. Capsule endoscopy is an endoscopic examination of the small intestine. It is not intended to examine the esophagus, stomach, or colon. It does not replace upper endoscopy or colonoscopy. This is a visual examination only and no biopsies may be taken with this technology. Additional procedures may be necessary to treat abnormalities that are identified.    Examination of the small intestine with capsule endoscopy is generally safe.  Complications are uncommon. Risks and complications include but are not limited to the following:    * In approximately 1 out of 100 procedures, the capsule can become lodged above a stricture or narrowed area. Patients who have Crohn's disease or have had abdominal surgery in the past are at increased risk for this complication. If an obstruction or stricture prevents passage of the capsule, surgery may be required for removal, with the associated risks of bleeding, infection, prolonged hospitalization and even death. An abdominal x-ray may be ordered in the weeks after the procedure if the physician is not able to determine that the capsule passed into the large intestine during the course of the study.    * There have been reports of the capsule becoming lodged at the back of the throat. Should this occur, it may need to be removed by a physician.      * There is a risk of aspiration (swallowing the capsule accidentally into the lung). Should that occur, pneumonia and other complications could result.     * Patients who have difficulty swallowing may be at increased risk for complications. It is important that you notify the nursing staff or physician prior to the capsule endoscopy procedure if you have difficulty swallowing.     * The capsule is not approved for use in patients  with pacemakers or implanted cardiac defibrillators. The capsule has been used in some centers in these patients without complications related to the pacemaker or implanted defibrillator.      * Due to variations in the intestinal motility, the capsule may only image part of the small intestine. It is also  possible that due to technical factors, including but not limited to food debris in the intestine and device malfunction, some images may be inadequate and this may result in the need to repeat the capsule procedure.     * Due to the metallic components of the capsule, MRI studies should not be performed until the capsule has passed out of the body. If you require an MRI study, notify the ordering physician that you have recently had capsule endoscopy and that the capsule must be passed out of the body before the MRI is performed.     * Capsule endoscopy is not a perfect study. Abnormalities, including cancer, might not be seen in some cases.   Small bowel enteroscopy using a standard endoscope is an alternative to examination of the small bowel, but cannot view the entire small bowel, as is the case with capsule endoscopy. Small bowel x-rays can view the entire small bowel, but are much less accurate.  Therefore, I think a capsule endoscopy may be the best test to evaluate for any GI bleeding.

## 2019-06-02 NOTE — Progress Notes (Signed)
VIALS EXP 06-01-2020

## 2019-06-03 ENCOUNTER — Other Ambulatory Visit: Payer: Self-pay | Admitting: Allergy and Immunology

## 2019-06-03 ENCOUNTER — Other Ambulatory Visit: Payer: Self-pay | Admitting: Physician Assistant

## 2019-06-03 ENCOUNTER — Other Ambulatory Visit: Payer: Self-pay | Admitting: *Deleted

## 2019-06-03 DIAGNOSIS — J301 Allergic rhinitis due to pollen: Secondary | ICD-10-CM | POA: Diagnosis not present

## 2019-06-03 DIAGNOSIS — E282 Polycystic ovarian syndrome: Secondary | ICD-10-CM

## 2019-06-03 MED ORDER — METFORMIN HCL 500 MG PO TABS: ORAL_TABLET | ORAL | 1 refills | Status: DC

## 2019-06-04 ENCOUNTER — Other Ambulatory Visit: Payer: Self-pay | Admitting: Physician Assistant

## 2019-06-04 DIAGNOSIS — F411 Generalized anxiety disorder: Secondary | ICD-10-CM

## 2019-06-08 ENCOUNTER — Ambulatory Visit: Payer: Self-pay

## 2019-06-08 DIAGNOSIS — J4550 Severe persistent asthma, uncomplicated: Secondary | ICD-10-CM | POA: Diagnosis not present

## 2019-06-09 ENCOUNTER — Ambulatory Visit (INDEPENDENT_AMBULATORY_CARE_PROVIDER_SITE_OTHER): Payer: BC Managed Care – PPO | Admitting: *Deleted

## 2019-06-09 DIAGNOSIS — J4550 Severe persistent asthma, uncomplicated: Secondary | ICD-10-CM | POA: Diagnosis not present

## 2019-06-10 ENCOUNTER — Ambulatory Visit: Payer: Self-pay

## 2019-06-11 ENCOUNTER — Ambulatory Visit (INDEPENDENT_AMBULATORY_CARE_PROVIDER_SITE_OTHER): Payer: BC Managed Care – PPO | Admitting: *Deleted

## 2019-06-11 DIAGNOSIS — J309 Allergic rhinitis, unspecified: Secondary | ICD-10-CM | POA: Diagnosis not present

## 2019-06-14 ENCOUNTER — Ambulatory Visit (INDEPENDENT_AMBULATORY_CARE_PROVIDER_SITE_OTHER): Payer: BC Managed Care – PPO

## 2019-06-14 DIAGNOSIS — J309 Allergic rhinitis, unspecified: Secondary | ICD-10-CM

## 2019-06-17 ENCOUNTER — Telehealth: Payer: Self-pay | Admitting: *Deleted

## 2019-06-17 NOTE — Telephone Encounter (Signed)
Please inform patient that she can use 10 mg prednisone - 1/2 tablet daily for 5 days only

## 2019-06-17 NOTE — Telephone Encounter (Signed)
Patient called states her cheek bones hurt, nasal congestion, and ear pressure and ear stuffiness since yesterday. States she has the same symptoms she had 3 weeks ago and Dr Neldon Mc prescribed prednisone which helped. Patient would like something called in. Patient denies fever,chills,bodyaches, loss of taste or smell and being around covid positive. Dr Neldon Mc please advise

## 2019-06-17 NOTE — Telephone Encounter (Signed)
Patient does not want prednisone states it makes her to hyper advised it was a low dose. Patient doesn't think it helped last time she would like [place] to send something stronger in. Dr Neldon Mc please advise

## 2019-06-17 NOTE — Telephone Encounter (Signed)
Please inform patient that she can come in for a Depo-Medrol 40 mg IM injection

## 2019-06-18 ENCOUNTER — Telehealth: Payer: Self-pay

## 2019-06-18 ENCOUNTER — Ambulatory Visit (INDEPENDENT_AMBULATORY_CARE_PROVIDER_SITE_OTHER): Payer: BC Managed Care – PPO

## 2019-06-18 ENCOUNTER — Other Ambulatory Visit: Payer: Self-pay

## 2019-06-18 DIAGNOSIS — J4550 Severe persistent asthma, uncomplicated: Secondary | ICD-10-CM | POA: Diagnosis not present

## 2019-06-18 MED ORDER — METHYLPREDNISOLONE ACETATE 40 MG/ML IJ SUSP
40.0000 mg | Freq: Once | INTRAMUSCULAR | Status: AC
  Administered 2019-06-18: 40 mg via INTRAMUSCULAR

## 2019-06-18 NOTE — Telephone Encounter (Signed)
Patient received depo-medrol 40 mg IM right deltoid. Patient called in around 345 and is c/o some soreness in her right arm. She told me that she went home and washed dishes and laid down for a few hours. When she woke up her arm was very sore. I advised her to use ice packs and to keep moving it so it does not stay so sore. I also let her know that any time we do muscular injections they can be sore. I let her know that if it started to bother her more after doing this then she could go to UC or call us back.

## 2019-06-18 NOTE — Telephone Encounter (Signed)
Call to patient, added to injection schedule today.

## 2019-06-21 NOTE — Telephone Encounter (Signed)
Follow up call placed to patient. Her arm is much better with some residual soreness. She says that her head is doing better but she did have some concern over getting her allergy shot this week as she thought this may bring her symptoms back on. I recommended that she hold this week on the allergy shot until she felt 100% better. She did thank me for calling her. I let her know that if she needed anything else to feel free to call the office.

## 2019-06-22 ENCOUNTER — Ambulatory Visit: Payer: BC Managed Care – PPO | Admitting: Orthopaedic Surgery

## 2019-06-25 ENCOUNTER — Other Ambulatory Visit: Payer: Self-pay | Admitting: Physician Assistant

## 2019-06-25 ENCOUNTER — Other Ambulatory Visit: Payer: Self-pay | Admitting: Allergy and Immunology

## 2019-06-29 ENCOUNTER — Ambulatory Visit: Payer: BC Managed Care – PPO | Admitting: Orthopaedic Surgery

## 2019-07-02 ENCOUNTER — Other Ambulatory Visit: Payer: Self-pay

## 2019-07-02 ENCOUNTER — Ambulatory Visit (INDEPENDENT_AMBULATORY_CARE_PROVIDER_SITE_OTHER): Payer: BC Managed Care – PPO | Admitting: *Deleted

## 2019-07-02 DIAGNOSIS — J309 Allergic rhinitis, unspecified: Secondary | ICD-10-CM

## 2019-07-05 ENCOUNTER — Other Ambulatory Visit: Payer: Self-pay

## 2019-07-05 ENCOUNTER — Encounter: Payer: Self-pay | Admitting: Physician Assistant

## 2019-07-05 ENCOUNTER — Ambulatory Visit: Payer: BC Managed Care – PPO | Admitting: Physician Assistant

## 2019-07-05 VITALS — BP 122/83 | HR 75 | Temp 98.6°F | Ht 62.0 in | Wt 143.0 lb

## 2019-07-05 DIAGNOSIS — F411 Generalized anxiety disorder: Secondary | ICD-10-CM

## 2019-07-05 DIAGNOSIS — E042 Nontoxic multinodular goiter: Secondary | ICD-10-CM

## 2019-07-05 DIAGNOSIS — Z981 Arthrodesis status: Secondary | ICD-10-CM

## 2019-07-05 DIAGNOSIS — L680 Hirsutism: Secondary | ICD-10-CM

## 2019-07-05 DIAGNOSIS — D500 Iron deficiency anemia secondary to blood loss (chronic): Secondary | ICD-10-CM

## 2019-07-05 DIAGNOSIS — N938 Other specified abnormal uterine and vaginal bleeding: Secondary | ICD-10-CM

## 2019-07-05 DIAGNOSIS — E785 Hyperlipidemia, unspecified: Secondary | ICD-10-CM

## 2019-07-05 DIAGNOSIS — E282 Polycystic ovarian syndrome: Secondary | ICD-10-CM

## 2019-07-05 DIAGNOSIS — L700 Acne vulgaris: Secondary | ICD-10-CM

## 2019-07-05 DIAGNOSIS — R635 Abnormal weight gain: Secondary | ICD-10-CM | POA: Diagnosis not present

## 2019-07-05 DIAGNOSIS — I10 Essential (primary) hypertension: Secondary | ICD-10-CM

## 2019-07-05 LAB — BAYER DCA HB A1C WAIVED: HB A1C (BAYER DCA - WAIVED): 5.8 % (ref <7.0)

## 2019-07-05 MED ORDER — MONTELUKAST SODIUM 10 MG PO TABS: 10.0000 mg | ORAL_TABLET | Freq: Every day | ORAL | 3 refills | Status: DC

## 2019-07-05 MED ORDER — HYDROCODONE-ACETAMINOPHEN 5-325 MG PO TABS: 1.0000 | ORAL_TABLET | Freq: Four times a day (QID) | ORAL | 0 refills | Status: DC | PRN

## 2019-07-05 MED ORDER — FLUTICASONE PROPIONATE 50 MCG/ACT NA SUSP: NASAL | 5 refills | Status: DC

## 2019-07-05 MED ORDER — FLUCONAZOLE 150 MG PO TABS: ORAL_TABLET | ORAL | 0 refills | Status: DC

## 2019-07-05 MED ORDER — CELACYN EX GEL: 1.0000 "application " | Freq: Every day | CUTANEOUS | 5 refills | Status: DC

## 2019-07-05 MED ORDER — POLYSACCHARIDE IRON COMPLEX 150 MG PO CAPS: 150.0000 mg | ORAL_CAPSULE | Freq: Two times a day (BID) | ORAL | 5 refills | Status: DC

## 2019-07-05 MED ORDER — POTASSIUM CHLORIDE CRYS ER 20 MEQ PO TBCR: EXTENDED_RELEASE_TABLET | ORAL | 1 refills | Status: DC

## 2019-07-05 MED ORDER — PANTOPRAZOLE SODIUM 40 MG PO TBEC: 40.0000 mg | DELAYED_RELEASE_TABLET | ORAL | 3 refills | Status: DC

## 2019-07-05 MED ORDER — VANIQA 13.9 % EX CREA: TOPICAL_CREAM | CUTANEOUS | 5 refills | Status: DC

## 2019-07-05 MED ORDER — LOSARTAN POTASSIUM 25 MG PO TABS: 25.0000 mg | ORAL_TABLET | Freq: Every day | ORAL | 3 refills | Status: DC

## 2019-07-05 MED ORDER — BACLOFEN 10 MG PO TABS: 10.0000 mg | ORAL_TABLET | Freq: Three times a day (TID) | ORAL | 5 refills | Status: DC | PRN

## 2019-07-05 MED ORDER — BUDESONIDE-FORMOTEROL FUMARATE 80-4.5 MCG/ACT IN AERO: 1.0000 | INHALATION_SPRAY | Freq: Every day | RESPIRATORY_TRACT | 5 refills | Status: DC

## 2019-07-05 MED ORDER — AMOXICILLIN-POT CLAVULANATE 875-125 MG PO TABS: 1.0000 | ORAL_TABLET | Freq: Two times a day (BID) | ORAL | 0 refills | Status: DC

## 2019-07-05 MED ORDER — ALPRAZOLAM 0.5 MG PO TABS: ORAL_TABLET | ORAL | 5 refills | Status: DC

## 2019-07-05 MED ORDER — METFORMIN HCL 500 MG PO TABS: ORAL_TABLET | ORAL | 3 refills | Status: AC

## 2019-07-05 MED ORDER — LINZESS 145 MCG PO CAPS: 145.0000 ug | ORAL_CAPSULE | Freq: Two times a day (BID) | ORAL | 5 refills | Status: DC

## 2019-07-05 MED ORDER — ADAPALENE 0.1 % EX CREA: TOPICAL_CREAM | Freq: Every day | CUTANEOUS | 5 refills | Status: DC

## 2019-07-05 MED ORDER — DIETHYLPROPION HCL ER 75 MG PO TB24: 75.0000 mg | ORAL_TABLET | Freq: Every day | ORAL | 5 refills | Status: DC

## 2019-07-05 MED ORDER — PRAVASTATIN SODIUM 20 MG PO TABS: 20.0000 mg | ORAL_TABLET | Freq: Every day | ORAL | 3 refills | Status: DC

## 2019-07-06 ENCOUNTER — Encounter: Payer: Self-pay | Admitting: Orthopaedic Surgery

## 2019-07-06 ENCOUNTER — Ambulatory Visit (INDEPENDENT_AMBULATORY_CARE_PROVIDER_SITE_OTHER): Payer: BC Managed Care – PPO | Admitting: Orthopaedic Surgery

## 2019-07-06 ENCOUNTER — Ambulatory Visit (INDEPENDENT_AMBULATORY_CARE_PROVIDER_SITE_OTHER): Payer: BC Managed Care – PPO | Admitting: *Deleted

## 2019-07-06 ENCOUNTER — Ambulatory Visit: Payer: Self-pay | Admitting: *Deleted

## 2019-07-06 ENCOUNTER — Ambulatory Visit: Payer: Self-pay

## 2019-07-06 VITALS — BP 123/76 | HR 95 | Ht 63.0 in | Wt 143.0 lb

## 2019-07-06 DIAGNOSIS — R200 Anesthesia of skin: Secondary | ICD-10-CM | POA: Insufficient documentation

## 2019-07-06 DIAGNOSIS — M542 Cervicalgia: Secondary | ICD-10-CM

## 2019-07-06 DIAGNOSIS — J4550 Severe persistent asthma, uncomplicated: Secondary | ICD-10-CM

## 2019-07-06 LAB — CBC WITH DIFFERENTIAL/PLATELET
Basophils Absolute: 0 10*3/uL (ref 0.0–0.2)
Basos: 0 %
EOS (ABSOLUTE): 0 10*3/uL (ref 0.0–0.4)
Eos: 0 %
Hematocrit: 39 % (ref 34.0–46.6)
Hemoglobin: 13.1 g/dL (ref 11.1–15.9)
Immature Grans (Abs): 0 10*3/uL (ref 0.0–0.1)
Immature Granulocytes: 0 %
Lymphocytes Absolute: 1.7 10*3/uL (ref 0.7–3.1)
Lymphs: 36 %
MCH: 30.1 pg (ref 26.6–33.0)
MCHC: 33.6 g/dL (ref 31.5–35.7)
MCV: 90 fL (ref 79–97)
Monocytes Absolute: 0.4 10*3/uL (ref 0.1–0.9)
Monocytes: 8 %
Neutrophils Absolute: 2.7 10*3/uL (ref 1.4–7.0)
Neutrophils: 56 %
Platelets: 220 10*3/uL (ref 150–450)
RBC: 4.35 x10E6/uL (ref 3.77–5.28)
RDW: 13.2 % (ref 11.7–15.4)
WBC: 4.9 10*3/uL (ref 3.4–10.8)

## 2019-07-06 LAB — LIPID PANEL
Chol/HDL Ratio: 3.7 ratio (ref 0.0–4.4)
Cholesterol, Total: 180 mg/dL (ref 100–199)
HDL: 49 mg/dL (ref >39)
LDL Calculated: 110 mg/dL — ABNORMAL HIGH (ref 0–99)
Triglycerides: 103 mg/dL (ref 0–149)
VLDL Cholesterol Cal: 21 mg/dL (ref 5–40)

## 2019-07-06 LAB — CMP14+EGFR
ALT: 14 IU/L (ref 0–32)
AST: 13 IU/L (ref 0–40)
Albumin/Globulin Ratio: 1.9 (ref 1.2–2.2)
Albumin: 4.2 g/dL (ref 3.8–4.8)
Alkaline Phosphatase: 77 IU/L (ref 39–117)
BUN/Creatinine Ratio: 12 (ref 9–23)
BUN: 9 mg/dL (ref 6–24)
Bilirubin Total: 0.3 mg/dL (ref 0.0–1.2)
CO2: 24 mmol/L (ref 20–29)
Calcium: 9.2 mg/dL (ref 8.7–10.2)
Chloride: 103 mmol/L (ref 96–106)
Creatinine, Ser: 0.78 mg/dL (ref 0.57–1.00)
GFR calc Af Amer: 103 mL/min/{1.73_m2} (ref >59)
GFR calc non Af Amer: 89 mL/min/{1.73_m2} (ref >59)
Globulin, Total: 2.2 g/dL (ref 1.5–4.5)
Glucose: 91 mg/dL (ref 65–99)
Potassium: 4.8 mmol/L (ref 3.5–5.2)
Sodium: 138 mmol/L (ref 134–144)
Total Protein: 6.4 g/dL (ref 6.0–8.5)

## 2019-07-06 LAB — THYROID PANEL WITH TSH
Free Thyroxine Index: 2.7 (ref 1.2–4.9)
T3 Uptake Ratio: 27 % (ref 24–39)
T4, Total: 9.9 ug/dL (ref 4.5–12.0)
TSH: 1.02 u[IU]/mL (ref 0.450–4.500)

## 2019-07-06 NOTE — Progress Notes (Signed)
Office Visit Note   Patient: Jennifer Mullins           Date of Birth: [birth year]           MRN: [id number] Visit Date: 07/06/2019              Requested by: Terald Sleeper, PA-C [place],  [place] [address] PCP: Terald Sleeper, PA-C   Assessment & Plan: Visit Diagnoses:  1. Neck pain   2. Numbness of left hand     Plan: Patient had previous carpal tunnel release now having ulnar hand numbness which wakes her up at night multiple times.  Her cervical spine is solidly healed and she has a normal triceps strength.  Will obtain electrical test to rule out cubital tunnel on the left office follow-up after test review.  Follow-Up Instructions: return after electical test rule out cubital tunnel.   Orders:  Orders Placed This Encounter  Procedures  . XR Cervical Spine 2 or 3 views   No orders of the defined types were placed in this encounter.     Procedures: No procedures performed   Clinical Data: No additional findings.   Subjective: Chief Complaint  Patient presents with  . Neck - Pain    11/13/16 C5-6,C6-7 ACDF, Allograft, Plate  . Left Arm - Pain    HPI 48 year old female seen with more than a month history of left hand numbness and tingling involving the ring and small finger.  She does denies any injury.  Previous two-level C5-C7 cervical fusion 2017.  Previous carpal tunnel release right and left.  Electrical test 6 to 7 years ago showed no evidence of cubital tunnel.  Patient states hand wakes her up at night she has to flex and extend her fingers.  She denies numbness in the median distribution of her hand.  She denies associated neck pain.  Review of Systems patient is on disability she has had problems with pulmonary embolism now just on baby aspirin a day.  Polycystic ovarian syndrome, GERD, anxiety, depression, asthma, dysfunctional uterine bleeding, hyperlipidemia.  Multinodular goiter nontoxic otherwise negative as pertains HPI.   Objective:  Vital Signs: BP 123/76   Pulse 95   Ht 5\' 3"  (1.6 m)   Wt 143 lb (64.9 kg)   BMI 25.33 kg/m   Physical Exam Constitutional:      Appearance: She is well-developed.  HENT:     Head: Normocephalic.     Right Ear: External ear normal.     Left Ear: External ear normal.  Eyes:     Pupils: Pupils are equal, round, and reactive to light.  Neck:     Thyroid: No thyromegaly.     Trachea: No tracheal deviation.  Cardiovascular:     Rate and Rhythm: Normal rate.  Pulmonary:     Effort: Pulmonary effort is normal.  Abdominal:     Palpations: Abdomen is soft.  Skin:    General: Skin is warm and dry.  Neurological:     Mental Status: She is alert and oriented to person, place, and time.  Psychiatric:        Behavior: Behavior normal.     Ortho Exam negative Spurling good cervical range of motion well-healed anterior cervical incision.  No supraclavicular adenopathy.  Prominent thyroid.  Some tenderness with palpation of the cubital tunnel negative ulnar nerve subluxation.  First dorsal interosseous and small finger abduction is strong.  Decreased sensation fifth finger decreased two-point more prominent  on the radial and ulnar side of the small finger.  Two-point sensation greater than 10 mm.  No tenderness over the hamate or hyperthenar region.  No hyperthenar atrophy.  First dorsal interosseous volar and dorsal interossei muscle appear normal to testing.  Specialty Comments:  No specialty comments available.  Imaging: Xr Cervical Spine 2 Or 3 Views  Result Date: 07/06/2019 2 lateral x-rays AP x-ray cervical spine obtained and reviewed.  This shows solid fusion at C5-6 C6-7 with good graft incorporation anterior plate and screws.  Slight anterior endplate spurring at C4 without disc space narrowing and no listhesis. Impression: C5-C7 anterior fusion satisfactory incorporation.  Negative for acute changes.    PMFS History: Patient Active Problem List   Diagnosis Date Noted  .  Numbness of left hand 07/06/2019  . Rectal bleeding 02/01/2019  . Acne vulgaris 02/01/2019  . Hirsutism 01/29/2019  . Hypokalemia 01/13/2019  . Acute blood loss anemia 01/13/2019  . Symptomatic anemia   . Fibromyalgia 07/09/2018  . Trigger thumb, right thumb 06/15/2018  . Lipoma of lower extremity 06/15/2018  . 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 Dyspareunia, female 12/11/2017  . Menometrorrhagia 12/11/2017  . Pain in right ankle and joints of right foot 10/29/2017  . Chronic pain of right knee 10/29/2017  . Iron deficiency anemia due to chronic blood loss 07/07/2017  . S/P cervical spinal fusion 02/27/2017  . Dysfunctional uterine bleeding 11/24/2016  . Anticoagulated 11/24/2016  . Allergic rhinoconjunctivitis 07/28/2015  . Chronic headache 07/28/2015  . PCOS (polycystic ovarian syndrome) 06/20/2015  . 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 Hyperlipidemia 06/20/2015  . GAD (generalized anxiety disorder) 05/18/2014  . GERD (gastroesophageal reflux disease) 05/18/2014  . Chronic sinusitis 07/02/2013  . Extrinsic asthma 05/20/2013  . HTN (hypertension) 05/20/2013   Past Medical History:  Diagnosis Date  . Anxiety   . Arthritis   . Asthma   . Bilateral calcaneal spurs   . Bilateral polycystic ovarian syndrome   . Bulging lumbar disc   . Carpal tunnel syndrome, bilateral   . COPD (chronic obstructive pulmonary disease) (H[facility])    BRONCHITIS  . Diabetes mellitus without complication ([facility])    TYPE 2   DX  4-5 YRS AGO  . ETD (Eustachian tube dysfunction), bilateral    left worse than right  . Fibromyalgia   . GAD (generalized anxiety disorder)   . GERD (gastroesophageal reflux disease)    TAKES PRESCRIPTION MEDS  . Glaucoma    BOTH EYES  . Hyperlipidemia   . Hypertension   . Hypothyroidism    NODULES ON THYROID  . PONV (postoperative nausea and vomiting)   . Pulmonary embolism ([facility])   . Tachycardia   . TMJ (dislocation of temporomandibular joint)   .  Trigger finger   . Trigger finger of left thumb     Family History  Problem Relation Age of Onset  . Hypertension Mother   . Hypertension Father   . Colon cancer Neg Hx   . Rectal cancer Neg Hx   . Stomach cancer Neg Hx   . Esophageal cancer Neg Hx     Past Surgical History:  Procedure Laterality Date  . ANTERIOR CERVICAL DECOMP/DISCECTOMY FUSION N/A 11/13/2016   Procedure: Cervical five-six, Cervical six-seven Anterior Cervical Discectomy and Fusion, Allograft, Plate;  Surgeon: Marybelle Killings, MD;  Location: [facility];  Service: Orthopedics;  Laterality: N/A;  . BREAST SURGERY    . CARPAL TUNNEL RELEASE    . DILATION AND CURETTAGE OF UTERUS    .  EXPLORATORY LAPAROTOMY    . LIPOMA EXCISION Left 06/15/2018   Procedure: LEFT CALF LIPOMA EXCISION;  Surgeon: Marybelle Killings, MD;  Location: [facility];  Service: Orthopedics;  Laterality: Left;  . NASAL SINUS SURGERY  07/22/13   Dr. Redmond Pulling in [place]  . plate and six screws in back    . TRIGGER FINGER RELEASE Right 06/15/2018   Procedure: RIGHT TRIGGER THUMB RELEASE;  Surgeon: Marybelle Killings, MD;  Location: [facility];  Service: Orthopedics;  Laterality: Right;   Social History   Occupational History  . Occupation: Unemployed  Tobacco Use  . Smoking status: Former Smoker    Packs/day: 0.25    Years: 5.00    Pack years: 1.25    Types: Cigarettes    Quit date: 11/18/1989    Years since quitting: 29.6  . Smokeless tobacco: Never Used  Substance and Sexual Activity  . Alcohol use: No  . Drug use: No  . Sexual activity: Yes    Birth control/protection: None

## 2019-07-07 ENCOUNTER — Ambulatory Visit: Payer: Self-pay

## 2019-07-07 ENCOUNTER — Telehealth: Payer: Self-pay | Admitting: *Deleted

## 2019-07-07 NOTE — Addendum Note (Signed)
Addended by: Meyer Cory on: 07/07/2019 08:06 AM   Modules accepted: Orders

## 2019-07-07 NOTE — Telephone Encounter (Addendum)
Prior Auth for Budesonid-Formoterol Fumarate 80-4.23mcg/act-DENIED  PRODUCT OR SERVICE NOT COVERED. Did not provide reason or alternates  Key: [id number]    Your information has been submitted to [place]. [employer] [place] will review the request and fax you a determination directly, typically within 3 business days of your submission once all necessary information is received.  If [employer] [facility] [employer] has not responded in 3 business days or if you have any questions about your submission, contact [place] at [phone].

## 2019-07-08 NOTE — Progress Notes (Signed)
BP 122/83   Pulse 75   Temp 98.6 F (37 C) (Oral)   Ht '5\' 2"'  (1.575 m)   Wt 143 lb (64.9 kg)   BMI 26.16 kg/m    Subjective:    Patient ID: Jennifer Mullins, female    DOB: November 24, 1970, 48 y.o.   MRN: [id number]  HPI: Jennifer Mullins is a 48 y.o. female presenting on 07/05/2019 for Medical Management of Chronic Issues  This patient comes in for recheck on all of her chronic medical conditions they do include hypertension which has been well controlled.  She does need updated her medication.  She also has dealt with polycystic ovarian syndrome.  And she has had a little bit of diabetes but it is very well controlled.  She does continue to take metformin.  She also takes treatment for acne.  She has had a spinal fusion.  And continued chronic pain in the back and legs.  We will update her medications for this.  Her PDMP was normal and her drug screen this year.  She has a history of iron deficiency and we will and has been doing very well.  She is still under care of gastroenterology and gynecology.  She also has hyperlipidemia, generalized anxiety   PAIN ASSESSMENT: Cause of pain- Impingement at L4-5 due to spondylosis and degenerative disc disease. Status post lower cervical ACDF with development of solid arthrodesis at C5-C6. Improved since 2018 but perhaps not yet fully   solid arthrodesis at C6-C7.  This patient returns for a 3 month recheck on narcotic use for the above named conditions  Current medications- hydrocodone 5/325 1 TID for pain Baclofen 10 mg 1 up to 3 times a day as needed spasm. Diclofenac gel Allergic: lyrica and tramadol Medication side effects- none Any concerns- no  Pain on scale of 1-10- 7 Frequency- most days What increases pain- standing What makes pain Better- rest Effects on ADL - moderate Any change in general medical condition- no  Effectiveness of current meds- good Adverse reactions form pain meds-no PMP AWARE website reviewed: Yes Any  suspicious activity on PMP Aware: No MME daily dose: 15 LME daily dose: 2  Contract on file 01/13/19 Last UDS  01/29/19  [facility] script sent or current  History of overdose or risk of abuse no   ANXIETY ASSESSMENT Cause of anxiety: GAD This patient returns for a  month recheck on narcotic use for the above named condition(s)  Current medications- alprazolam 0.5 mg  1 BID prn anxiety She has been on this over the past years with Dr. Evette Doffing.  Next vist we will discuss buspar or any SSRI  She was a visit with many problems and will address this with more time at the next visit Medication side effects- no Any concerns- no Any change in general medical condition- no Effectiveness of current meds- good PMP AWARE website reviewed: Yes Any suspicious activity on PMP Aware: No LME daily dose: 2  Contract on file 01/13/19 Last UDS  01/29/19  History of overdose or risk of abuse no  Past Medical History:  Diagnosis Date  . Anxiety   . Arthritis   . Asthma   . Bilateral calcaneal spurs   . Bilateral polycystic ovarian syndrome   . Bulging lumbar disc   . Carpal tunnel syndrome, bilateral   . COPD (chronic obstructive pulmonary disease) (HCC)    BRONCHITIS  . Diabetes mellitus without complication (HCC)    TYPE 2   DX  4-5 YRS AGO  . ETD (Eustachian tube dysfunction), bilateral    left worse than right  . Fibromyalgia   . GAD (generalized anxiety disorder)   . GERD (gastroesophageal reflux disease)    TAKES PRESCRIPTION MEDS  . Glaucoma    BOTH EYES  . Hyperlipidemia   . Hypertension   . Hypothyroidism    NODULES ON THYROID  . PONV (postoperative nausea and vomiting)   . Pulmonary embolism ([place])   . Tachycardia   . TMJ (dislocation of temporomandibular joint)   . Trigger finger   . Trigger finger of left thumb    Relevant past medical, surgical, family and social history reviewed and updated as indicated. Interim medical history since our last visit reviewed. Allergies  and medications reviewed and updated. DATA REVIEWED: CHART IN EPIC  Family History reviewed for pertinent findings.  Review of Systems  Constitutional: Negative.   HENT: Negative.   Eyes: Negative.   Respiratory: Negative.   Gastrointestinal: Negative.   Genitourinary: Negative.   Musculoskeletal: Positive for arthralgias, back pain, myalgias and neck pain.  Psychiatric/Behavioral: Positive for dysphoric mood. The patient is nervous/anxious.     Allergies as of 07/05/2019      Reactions   Demerol [meperidine] Shortness Of Breath   FLUSHING AND SHORTNESS OF BREATH   Morphine And Related Shortness Of Breath   Flushed and hot hyper Flushed and hot hyper Flushed and hot hyper Flushed and hot hyper   Other Shortness Of Breath   Flushed and hot hyper Flushed and hot hyper Flushed and hot hyper Flushed and hot hyper Flushed and hot hyper   Cardizem  [diltiazem Hcl] Other (See Comments)   Diltiazem Hcl Hives   Lisinopril    Caused cough   Lyrica [pregabalin]    Chest pain, elevated heart rate   Tramadol Other (See Comments)   Tears up stomach      Medication List       Accurate as of July 05, 2019 11:59 PM. If you have any questions, ask your nurse or doctor.        STOP taking these medications   busPIRone 10 MG tablet Commonly known as: BUSPAR Stopped by: Terald Sleeper, PA-C     TAKE these medications   adapalene 0.1 % cream Commonly known as: Differin Apply topically at bedtime.   albuterol (2.5 MG/3ML) 0.083% nebulizer solution Commonly known as: PROVENTIL INHALE THE CONTENTS OF ONE VIAL EVERY 6 HOURS AS NEEDED FOR WHEEZING OR SHORTNESS OF BREATH   albuterol 108 (90 Base) MCG/ACT inhaler Commonly known as: VENTOLIN HFA INHALE TWO PUFFS INTO THE LUNGS EVERY 6 HOURS AS NEEDED FOR WHEEZING OR SHORTNESS OF BREATH   ALPRAZolam 0.5 MG tablet Commonly known as: XANAX TAKE 1 TABLET BY MOUTH TWICE DAILY AS NEEDED. MAY TAKE ONE MORE TABLET BEFORE FOR ALLERGY  SHOTS ONCE A WEEK (30 DAY supply)   amoxicillin-clavulanate 875-125 MG tablet Commonly known as: AUGMENTIN Take 1 tablet by mouth 2 (two) times daily. Started by: Terald Sleeper, PA-C   aspirin 81 MG chewable tablet Chew 81 mg by mouth daily.   Auvi-Q 0.3 mg/0.3 mL Soaj injection Generic drug: EPINEPHrine Use as directed for life-threatening allergic reaction.   baclofen 10 MG tablet Commonly known as: LIORESAL Take 1 tablet (10 mg total) by mouth 3 (three) times daily as needed.   budesonide-formoterol 80-4.5 MCG/ACT inhaler Commonly known as: SYMBICORT Inhale 1-2 puffs into the lungs daily.   Celacyn Gel Apply 1 application topically  at bedtime.   cetirizine 10 MG tablet Commonly known as: ZYRTEC TAKE 1 TABLET BY MOUTH TWICE DAILY   cromolyn 4 % ophthalmic solution Commonly known as: OPTICROM Place 1 drop into both eyes 4 (four) times daily.   cycloSPORINE 0.05 % ophthalmic emulsion Commonly known as: RESTASIS Place 1 drop into both eyes 2 (two) times daily.   diclofenac sodium 1 % Gel Commonly known as: VOLTAREN APPLY TWO GRAMS TOPICALLY FOUR TIMES DAILY   Diethylpropion HCl CR 75 MG Tb24 Take 1 tablet (75 mg total) by mouth daily.   fluconazole 150 MG tablet Commonly known as: Diflucan 1 po q week x 4 weeks Started by: Terald Sleeper, PA-C   fluticasone 50 MCG/ACT nasal spray Commonly known as: FLONASE 1-2 sprays in each nostril 3 times weekly as needed.   furosemide 20 MG tablet Commonly known as: LASIX TAKE 1 TABLET BY MOUTH DAILY AS NEEDED FOR FLUID   HYDROcodone-acetaminophen 5-325 MG tablet Commonly known as: Norco Take 1 tablet by mouth every 6 (six) hours as needed for moderate pain. What changed: Another medication with the same name was added. Make sure you understand how and when to take each. Changed by: Terald Sleeper, PA-C   HYDROcodone-acetaminophen 5-325 MG tablet Commonly known as: Norco Take 1 tablet by mouth every 6 (six) hours as  needed for moderate pain. What changed: You were already taking a medication with the same name, and this prescription was added. Make sure you understand how and when to take each. Changed by: Terald Sleeper, PA-C   HYDROcodone-acetaminophen 5-325 MG tablet Commonly known as: Norco Take 1 tablet by mouth every 6 (six) hours as needed for moderate pain. What changed: You were already taking a medication with the same name, and this prescription was added. Make sure you understand how and when to take each. Changed by: Terald Sleeper, PA-C   iron polysaccharides 150 MG capsule Commonly known as: IFerex 150 Take 1 capsule (150 mg total) by mouth 2 (two) times daily.   Linzess 145 MCG Caps capsule Generic drug: linaclotide Take 1 capsule (145 mcg total) by mouth 2 (two) times daily.   losartan 25 MG tablet Commonly known as: COZAAR Take 1 tablet (25 mg total) by mouth daily. What changed: when to take this Changed by: Terald Sleeper, PA-C   medroxyPROGESTERone 10 MG tablet Commonly known as: PROVERA TAKE 1 TABLET BY MOUTH DAILY   metFORMIN 500 MG tablet Commonly known as: GLUCOPHAGE TAKE 1 TABLET BY MOUTH EVERY DAY WITH BREAKFAST   montelukast 10 MG tablet Commonly known as: SINGULAIR Take 1 tablet (10 mg total) by mouth at bedtime.   Nucala 100 MG Solr Generic drug: Mepolizumab inject 100 mg under the skin every 4 weeks (to be given by a healthcare provider)   Olopatadine HCl 0.6 % Soln INSTILL 2 SPRAYS IN EACH NOSTRIL TWICE DAILY   pantoprazole 40 MG tablet Commonly known as: PROTONIX Take 1 tablet (40 mg total) by mouth every morning.   potassium chloride SA 20 MEQ tablet Commonly known as: K-DUR TAKE 1 TABLET BY MOUTH DAILY WHILE TAKING LASIX   pravastatin 20 MG tablet Commonly known as: PRAVACHOL Take 1 tablet (20 mg total) by mouth at bedtime.   SM Complete Advanced Formula Tabs Take 1 tablet by mouth daily.   sterile water (preservative free) injection  Inject 10 mLs as directed as directed.   Vaniqa 13.9 % cream Generic drug: Eflornithine HCl APPLY TO AFFECTED AREA  TWICE DAILY          Objective:    BP 122/83   Pulse 75   Temp 98.6 F (37 C) (Oral)   Ht '5\' 2"'  (1.575 m)   Wt 143 lb (64.9 kg)   BMI 26.16 kg/m   Allergies  Allergen Reactions  . Demerol [Meperidine] Shortness Of Breath    FLUSHING AND SHORTNESS OF BREATH  . Morphine And Related Shortness Of Breath    Flushed and hot hyper Flushed and hot hyper Flushed and hot hyper Flushed and hot hyper  . Other Shortness Of Breath    Flushed and hot hyper Flushed and hot hyper Flushed and hot hyper Flushed and hot hyper Flushed and hot hyper  . Cardizem  [Diltiazem Hcl] Other (See Comments)  . Diltiazem Hcl Hives  . Lisinopril     Caused cough  . Lyrica [Pregabalin]     Chest pain, elevated heart rate  . Tramadol Other (See Comments)    Tears up stomach    Wt Readings from Last 3 Encounters:  07/06/19 143 lb (64.9 kg)  07/05/19 143 lb (64.9 kg)  05/27/19 140 lb (63.5 kg)    Physical Exam Constitutional:      General: She is not in acute distress.    Appearance: Normal appearance. She is well-developed.  HENT:     Head: Normocephalic and atraumatic.  Cardiovascular:     Rate and Rhythm: Normal rate.  Pulmonary:     Effort: Pulmonary effort is normal.  Musculoskeletal:     Cervical back: She exhibits decreased range of motion and tenderness.     Lumbar back: She exhibits decreased range of motion, tenderness, pain and spasm.  Skin:    General: Skin is warm and dry.     Findings: No rash.  Neurological:     Mental Status: She is alert and oriented to person, place, and time.     Deep Tendon Reflexes: Reflexes are normal and symmetric.     Results for orders placed or performed in visit on 07/05/19  CBC with Differential/Platelet  Result Value Ref Range   WBC 4.9 3.4 - 10.8 x10E3/uL   RBC 4.35 3.77 - 5.28 x10E6/uL   Hemoglobin 13.1 11.1 - 15.9  g/dL   Hematocrit 39.0 34.0 - 46.6 %   MCV 90 79 - 97 fL   MCH 30.1 26.6 - 33.0 pg   MCHC 33.6 31.5 - 35.7 g/dL   RDW 13.2 11.7 - 15.4 %   Platelets 220 150 - 450 x10E3/uL   Neutrophils 56 Not Estab. %   Lymphs 36 Not Estab. %   Monocytes 8 Not Estab. %   Eos 0 Not Estab. %   Basos 0 Not Estab. %   Neutrophils Absolute 2.7 1.4 - 7.0 x10E3/uL   Lymphocytes Absolute 1.7 0.7 - 3.1 x10E3/uL   Monocytes Absolute 0.4 0.1 - 0.9 x10E3/uL   EOS (ABSOLUTE) 0.0 0.0 - 0.4 x10E3/uL   Basophils Absolute 0.0 0.0 - 0.2 x10E3/uL   Immature Granulocytes 0 Not Estab. %   Immature Grans (Abs) 0.0 0.0 - 0.1 x10E3/uL  CMP14+EGFR  Result Value Ref Range   Glucose 91 65 - 99 mg/dL   BUN 9 6 - 24 mg/dL   Creatinine, Ser 0.78 0.57 - 1.00 mg/dL   GFR calc non Af Amer 89 >59 mL/min/1.73   GFR calc Af Amer 103 >59 mL/min/1.73   BUN/Creatinine Ratio 12 9 - 23   Sodium 138 134 - 144 mmol/L  Potassium 4.8 3.5 - 5.2 mmol/L   Chloride 103 96 - 106 mmol/L   CO2 24 20 - 29 mmol/L   Calcium 9.2 8.7 - 10.2 mg/dL   Total Protein 6.4 6.0 - 8.5 g/dL   Albumin 4.2 3.8 - 4.8 g/dL   Globulin, Total 2.2 1.5 - 4.5 g/dL   Albumin/Globulin Ratio 1.9 1.2 - 2.2   Bilirubin Total 0.3 0.0 - 1.2 mg/dL   Alkaline Phosphatase 77 39 - 117 IU/L   AST 13 0 - 40 IU/L   ALT 14 0 - 32 IU/L  Lipid panel  Result Value Ref Range   Cholesterol, Total 180 100 - 199 mg/dL   Triglycerides 103 0 - 149 mg/dL   HDL 49 >39 mg/dL   VLDL Cholesterol Cal 21 5 - 40 mg/dL   LDL Calculated 110 (H) 0 - 99 mg/dL   Chol/HDL Ratio 3.7 0.0 - 4.4 ratio  Bayer DCA Hb A1c Waived  Result Value Ref Range   HB A1C (BAYER DCA - WAIVED) 5.8 <7.0 %  Thyroid Panel With TSH  Result Value Ref Range   TSH 1.020 0.450 - 4.500 uIU/mL   T4, Total 9.9 4.5 - 12.0 ug/dL   T3 Uptake Ratio 27 24 - 39 %   Free Thyroxine Index 2.7 1.2 - 4.9   *Note: Due to a large number of results and/or encounters for the requested time period, some results have not been  displayed. A complete set of results can be found in Results Review.      Assessment & Plan:   1. GAD (generalized anxiety disorder) - ALPRAZolam (XANAX) 0.5 MG tablet; TAKE 1 TABLET BY MOUTH TWICE DAILY AS NEEDED. MAY TAKE ONE MORE TABLET BEFORE FOR ALLERGY SHOTS ONCE A WEEK (30 DAY supply)  Dispense: 65 tablet; Refill: 5  2. Weight gain - Diethylpropion HCl CR 75 MG TB24; Take 1 tablet (75 mg total) by mouth daily.  Dispense: 30 tablet; Refill: 5  3. Hirsutism - VANIQA 13.9 % cream; APPLY TO AFFECTED AREA TWICE DAILY  Dispense: 45 g; Refill: 5  4. Acne vulgaris - adapalene (DIFFERIN) 0.1 % cream; Apply topically at bedtime.  Dispense: 45 g; Refill: 5  5. PCOS (polycystic ovarian syndrome) - metFORMIN (GLUCOPHAGE) 500 MG tablet; TAKE 1 TABLET BY MOUTH EVERY DAY WITH BREAKFAST  Dispense: 90 tablet; Refill: 3  6. Iron deficiency anemia due to chronic blood loss - iron polysaccharides (IFEREX 150) 150 MG capsule; Take 1 capsule (150 mg total) by mouth 2 (two) times daily.  Dispense: 60 capsule; Refill: 5 - CBC with Differential/Platelet - CMP14+EGFR - Lipid panel - Bayer DCA Hb A1c Waived  7. Dysfunctional uterine bleeding - iron polysaccharides (IFEREX 150) 150 MG capsule; Take 1 capsule (150 mg total) by mouth 2 (two) times daily.  Dispense: 60 capsule; Refill: 5  8. Hyperlipidemia, unspecified hyperlipidemia type - pravastatin (PRAVACHOL) 20 MG tablet; Take 1 tablet (20 mg total) by mouth at bedtime.  Dispense: 90 tablet; Refill: 3 - CBC with Differential/Platelet - CMP14+EGFR - Lipid panel - Bayer DCA Hb A1c Waived  9. S/P cervical spinal fusion - HYDROcodone-acetaminophen (NORCO) 5-325 MG tablet; Take 1 tablet by mouth every 6 (six) hours as needed for moderate pain.  Dispense: 90 tablet; Refill: 0 - HYDROcodone-acetaminophen (NORCO) 5-325 MG tablet; Take 1 tablet by mouth every 6 (six) hours as needed for moderate pain.  Dispense: 90 tablet; Refill: 0 -  HYDROcodone-acetaminophen (NORCO) 5-325 MG tablet; Take 1 tablet by mouth  every 6 (six) hours as needed for moderate pain.  Dispense: 90 tablet; Refill: 0  10. Essential hypertension - losartan (COZAAR) 25 MG tablet; Take 1 tablet (25 mg total) by mouth daily.  Dispense: 90 tablet; Refill: 3 - CBC with Differential/Platelet - CMP14+EGFR - Lipid panel - Bayer DCA Hb A1c Waived  11. Multiple thyroid nodules - Thyroid Panel With TSH   Continue all other maintenance medications as listed above.  Follow up plan: Return in about 3 months (around 10/05/2019).  Educational handout given for Badin PA-C [place] [address]  [place], [place] [address] [phone]   07/08/2019, 2:48 PM

## 2019-07-09 ENCOUNTER — Ambulatory Visit: Payer: BLUE CROSS/BLUE SHIELD | Admitting: Physician Assistant

## 2019-07-09 ENCOUNTER — Ambulatory Visit: Payer: BC Managed Care – PPO | Admitting: Physician Assistant

## 2019-07-09 ENCOUNTER — Ambulatory Visit (INDEPENDENT_AMBULATORY_CARE_PROVIDER_SITE_OTHER): Payer: BC Managed Care – PPO

## 2019-07-09 DIAGNOSIS — J309 Allergic rhinitis, unspecified: Secondary | ICD-10-CM | POA: Diagnosis not present

## 2019-07-13 ENCOUNTER — Telehealth: Payer: Self-pay

## 2019-07-13 ENCOUNTER — Telehealth: Payer: Self-pay | Admitting: *Deleted

## 2019-07-13 ENCOUNTER — Other Ambulatory Visit: Payer: Self-pay | Admitting: Physician Assistant

## 2019-07-13 ENCOUNTER — Telehealth: Payer: Self-pay | Admitting: Physician Assistant

## 2019-07-13 MED ORDER — DULERA 100-5 MCG/ACT IN AERO: 2.0000 | INHALATION_SPRAY | Freq: Every day | RESPIRATORY_TRACT | 11 refills | Status: DC

## 2019-07-13 MED ORDER — SYMBICORT 80-4.5 MCG/ACT IN AERO: 2.0000 | INHALATION_SPRAY | Freq: Every day | RESPIRATORY_TRACT | 12 refills | Status: DC

## 2019-07-13 NOTE — Telephone Encounter (Signed)
I spoke with pt and she says [employer] called her and told her they would cover the Brand name, not the generic. So I sent over the Brand name and pt is aware.

## 2019-07-13 NOTE — Progress Notes (Signed)
dulk

## 2019-07-13 NOTE — Telephone Encounter (Signed)
Patient would like a call back concerning NCV.  Cb# is [phone].  Please advise.  Thank you.

## 2019-07-13 NOTE — Telephone Encounter (Signed)
I have sent in Dulera 100 mg strength to her pharmacy.  It looks like this should be covered.  We will discontinue the Symbicort at this time.

## 2019-07-13 NOTE — Telephone Encounter (Signed)
Called pt lvm #2 on home and mobile.

## 2019-07-13 NOTE — Telephone Encounter (Signed)
Followed up with [facility], [facility]CS notified this RN that the patient has been scheduled 3 times and has cancelled every appointment.

## 2019-07-16 ENCOUNTER — Ambulatory Visit (INDEPENDENT_AMBULATORY_CARE_PROVIDER_SITE_OTHER): Payer: BC Managed Care – PPO

## 2019-07-16 DIAGNOSIS — J309 Allergic rhinitis, unspecified: Secondary | ICD-10-CM | POA: Diagnosis not present

## 2019-07-21 ENCOUNTER — Telehealth: Payer: Self-pay | Admitting: Physician Assistant

## 2019-07-21 ENCOUNTER — Other Ambulatory Visit: Payer: Self-pay | Admitting: Allergy and Immunology

## 2019-07-21 ENCOUNTER — Telehealth: Payer: Self-pay | Admitting: Physical Medicine and Rehabilitation

## 2019-07-21 ENCOUNTER — Encounter: Payer: Self-pay | Admitting: Physical Medicine and Rehabilitation

## 2019-07-21 ENCOUNTER — Ambulatory Visit (INDEPENDENT_AMBULATORY_CARE_PROVIDER_SITE_OTHER): Payer: BC Managed Care – PPO | Admitting: Physical Medicine and Rehabilitation

## 2019-07-21 DIAGNOSIS — R202 Paresthesia of skin: Secondary | ICD-10-CM | POA: Diagnosis not present

## 2019-07-21 NOTE — Progress Notes (Signed)
  Numeric Pain Rating Scale and Functional Assessment Average Pain 8   In the last MONTH (on 0-10 scale) has pain interfered with the following?  1. General activity like being  able to carry out your everyday physical activities such as walking, climbing stairs, carrying groceries, or moving a chair?  Rating(7)     

## 2019-07-21 NOTE — Telephone Encounter (Signed)
Patient called wanting to know if there is suppose to be any pain after the NCS.  She also stated that she took her Fibromyalgia medication and wanted to know if that had anything to do with it.  CB#[phone].  Thank you.

## 2019-07-21 NOTE — Telephone Encounter (Signed)
Please advise 

## 2019-07-21 NOTE — Telephone Encounter (Signed)
Patient called advised she left a message on voicemail this morning and would like a call back. The number to contact patient is [phone] or [phone]

## 2019-07-21 NOTE — Telephone Encounter (Signed)
Called patient and left message to advise. 

## 2019-07-21 NOTE — Telephone Encounter (Signed)
Pt aware.

## 2019-07-21 NOTE — Telephone Encounter (Signed)
There can be discomfort from positioning and the small emg needle obviously could feel some soreness. The stimulation is in milliamps and just stimulates. There is no tissue damage. Medications do not effect the study.

## 2019-07-21 NOTE — Telephone Encounter (Signed)
Patient aware of lab results.

## 2019-07-22 ENCOUNTER — Ambulatory Visit: Payer: Self-pay

## 2019-07-22 ENCOUNTER — Other Ambulatory Visit: Payer: Self-pay

## 2019-07-22 ENCOUNTER — Ambulatory Visit (INDEPENDENT_AMBULATORY_CARE_PROVIDER_SITE_OTHER): Payer: BC Managed Care – PPO | Admitting: Orthopaedic Surgery

## 2019-07-22 ENCOUNTER — Encounter: Payer: Self-pay | Admitting: Orthopaedic Surgery

## 2019-07-22 VITALS — BP 135/61 | HR 75 | Ht 62.0 in | Wt 143.0 lb

## 2019-07-22 DIAGNOSIS — M545 Low back pain, unspecified: Secondary | ICD-10-CM

## 2019-07-22 NOTE — Procedures (Signed)
EMG & NCV Findings: All nerve conduction studies (as indicated in the following tables) were within normal limits.    All examined muscles (as indicated in the following table) showed no evidence of electrical instability.    Impression: Essentially NORMAL electrodiagnostic study of the left upper limb.  There is no significant electrodiagnostic evidence of nerve entrapment, brachial plexopathy or cervical radiculopathy.   As you know, purely sensory or demyelinating radiculopathies and chemical radiculitis may not be detected with this particular electrodiagnostic study.   **This electrodiagnostic study cannot rule out small fiber polyneuropathy and dysesthesias from central pain syndromes such as stroke or central pain sensitization syndromes such as fibromyalgia.  Myotomal referral pain from trigger points is also not excluded.  Recommendations: 1.  Follow-up with referring physician. 2.  Continue current management of symptoms.  ___________________________ Laurence Spates FAAPMR Board Certified, American Board of Physical Medicine and Rehabilitation    Nerve Conduction Studies Anti Sensory Summary Table   Stim Site NR Peak (ms) Norm Peak (ms) P-T Amp (V) Norm P-T Amp Site1 Site2 Delta-P (ms) Dist (cm) Vel (m/s) Norm Vel (m/s)  Left Median Acr Palm Anti Sensory (2nd Digit)  32.1C  Wrist    3.5 <3.6 27.6 >10 Wrist Palm 1.8 0.0    Palm    1.7 <2.0 38.1         Left Radial Anti Sensory (Base 1st Digit)  32C  Wrist    1.9 <3.1 30.9  Wrist Base 1st Digit 1.9 0.0    Left Ulnar Anti Sensory (5th Digit)  32.3C  Wrist    3.1 <3.7 32.5 >15.0 Wrist 5th Digit 3.1 14.0 45 >38   Motor Summary Table   Stim Site NR Onset (ms) Norm Onset (ms) O-P Amp (mV) Norm O-P Amp Site1 Site2 Delta-0 (ms) Dist (cm) Vel (m/s) Norm Vel (m/s)  Left Median Motor (Abd Poll Brev)  32.1C  Wrist    3.6 <4.2 8.7 >5 Elbow Wrist 3.7 20.0 54 >50  Elbow    7.3  9.2         Left Ulnar Motor (Abd Dig Min)  32.3C   Wrist    2.7 <4.2 9.1 >3 B Elbow Wrist 3.2 19.5 61 >53  B Elbow    5.9  8.6  A Elbow B Elbow 1.4 10.0 71 >53  A Elbow    7.3  8.8          EMG   Side Muscle Nerve Root Ins Act Fibs Psw Amp Dur Poly Recrt Int Fraser Din Comment  Left Abd Poll Brev Median C8-T1 Nml Nml Nml Nml Nml 0 Nml Nml   Left 1stDorInt Ulnar C8-T1 Nml Nml Nml Nml Nml 0 Nml Nml   Left PronatorTeres Median C6-7 Nml Nml Nml Nml Nml 0 Nml Nml   Left Biceps Musculocut C5-6 Nml Nml Nml Nml Nml 0 Nml Nml   Left Deltoid Axillary C5-6 Nml Nml Nml Nml Nml 0 Nml Nml     Nerve Conduction Studies Anti Sensory Left/Right Comparison   Stim Site L Lat (ms) R Lat (ms) L-R Lat (ms) L Amp (V) R Amp (V) L-R Amp (%) Site1 Site2 L Vel (m/s) R Vel (m/s) L-R Vel (m/s)  Median Acr Palm Anti Sensory (2nd Digit)  32.1C  Wrist 3.5   27.6   Wrist Palm     Palm 1.7   38.1         Radial Anti Sensory (Base 1st Digit)  32C  Wrist 1.9  30.9   Wrist Base 1st Digit     Ulnar Anti Sensory (5th Digit)  32.3C  Wrist 3.1   32.5   Wrist 5th Digit 45     Motor Left/Right Comparison   Stim Site L Lat (ms) R Lat (ms) L-R Lat (ms) L Amp (mV) R Amp (mV) L-R Amp (%) Site1 Site2 L Vel (m/s) R Vel (m/s) L-R Vel (m/s)  Median Motor (Abd Poll Brev)  32.1C  Wrist 3.6   8.7   Elbow Wrist 54    Elbow 7.3   9.2         Ulnar Motor (Abd Dig Min)  32.3C  Wrist 2.7   9.1   B Elbow Wrist 61    B Elbow 5.9   8.6   A Elbow B Elbow 71    A Elbow 7.3   8.8            Waveforms:

## 2019-07-22 NOTE — Progress Notes (Signed)
Office Visit Note   Patient: Jennifer Mullins           Date of Birth: 03-Aug-1971           MRN: [id number] Visit Date: 07/22/2019              Requested by: Terald Sleeper, PA-C [address] [place],  [place] [address] PCP: Terald Sleeper, PA-C   Assessment & Plan: Visit Diagnoses:  1. Low back pain, unspecified back pain laterality, unspecified chronicity, unspecified whether sciatica present     Plan: Post cervical fusion with continued problems with left hand numbness.  Electrical tests were normal and recommend she resume normal activities.  She does recall that she did moving at her uncles was doing a lot of lifting and activity.  Discussed with her that the electrical test show no need for further surgery at this time.  Follow-Up Instructions: No follow-ups on file.   Orders:  Orders Placed This Encounter  Procedures  . XR Lumbar Spine 2-3 Views   No orders of the defined types were placed in this encounter.     Procedures: No procedures performed   Clinical Data: No additional findings.   Subjective: Chief Complaint  Patient presents with  . Lower Back - Pain  . Follow-up    review EMG/NCV    HPI 48 year old female returns for follow-up with problems with left hand numbness.  Previous cervical fusion C5-C7 in 2017.  She does have a history of fibromyalgia.  Electrical test have been obtained EMGs nerve conduction velocities which are normal.  No evidence of nerve entrapment, no brachial plexopathy and no evidence of cervical radiculopathy.  Patient had previous carpal tunnel release by Dr. Fredna Dow on the left.  Review of Systems 14 point update unchanged from 07/06/2019 office visit.  Objective: Vital Signs: BP 135/61   Pulse 75   Ht 5\' 2"  (1.575 m)   Wt 143 lb (64.9 kg)   BMI 26.16 kg/m   Physical Exam  Ortho Exam  Specialty Comments:  No specialty comments available.  Imaging: Xr Lumbar Spine 2-3 Views  Result Date: 07/22/2019 AP lateral lumbar  spine x-rays are obtained and reviewed.  This shows normal lumbar curvature.  No spondylolisthesis negative for acute fracture. Impression: Normal lumbar x-rays.    PMFS History: Patient Active Problem List   Diagnosis Date Noted  . Numbness of left hand 07/06/2019  . Rectal bleeding 02/01/2019  . Acne vulgaris 02/01/2019  . Hirsutism 01/29/2019  . Hypokalemia 01/13/2019  . Acute blood loss anemia 01/13/2019  . Symptomatic anemia   . Fibromyalgia 07/09/2018  . Trigger thumb, right thumb 06/15/2018  . Lipoma of lower extremity 06/15/2018  . 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 Dyspareunia, female 12/11/2017  . Menometrorrhagia 12/11/2017  . Pain in right ankle and joints of right foot 10/29/2017  . Chronic pain of right knee 10/29/2017  . Iron deficiency anemia due to chronic blood loss 07/07/2017  . S/P cervical spinal fusion 02/27/2017  . Dysfunctional uterine bleeding 11/24/2016  . Anticoagulated 11/24/2016  . Allergic rhinoconjunctivitis 07/28/2015  . Chronic headache 07/28/2015  . PCOS (polycystic ovarian syndrome) 06/20/2015  . 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 Hyperlipidemia 06/20/2015  . GAD (generalized anxiety disorder) 05/18/2014  . GERD (gastroesophageal reflux disease) 05/18/2014  . Chronic sinusitis 07/02/2013  . Extrinsic asthma 05/20/2013  . HTN (hypertension) 05/20/2013   Past Medical History:  Diagnosis Date  . Anxiety   . Arthritis   .  Asthma   . Bilateral calcaneal spurs   . Bilateral polycystic ovarian syndrome   . Bulging lumbar disc   . Carpal tunnel syndrome, bilateral   . COPD (chronic obstructive pulmonary disease) (HCC)    BRONCHITIS  . Diabetes mellitus without complication ([place])    TYPE 2   DX  4-5 YRS AGO  . ETD (Eustachian tube dysfunction), bilateral    left worse than right  . Fibromyalgia   . GAD (generalized anxiety disorder)   . GERD (gastroesophageal reflux disease)    TAKES PRESCRIPTION MEDS  . Glaucoma     BOTH EYES  . Hyperlipidemia   . Hypertension   . Hypothyroidism    NODULES ON THYROID  . PONV (postoperative nausea and vomiting)   . Pulmonary embolism ([facility])   . Tachycardia   . TMJ (dislocation of temporomandibular joint)   . Trigger finger   . Trigger finger of left thumb     Family History  Problem Relation Age of Onset  . Hypertension Mother   . Hypertension Father   . Colon cancer Neg Hx   . Rectal cancer Neg Hx   . Stomach cancer Neg Hx   . Esophageal cancer Neg Hx     Past Surgical History:  Procedure Laterality Date  . ANTERIOR CERVICAL DECOMP/DISCECTOMY FUSION N/A 11/13/2016   Procedure: Cervical five-six, Cervical six-seven Anterior Cervical Discectomy and Fusion, Allograft, Plate;  Surgeon: Marybelle Killings, MD;  Location: [facility];  Service: Orthopedics;  Laterality: N/A;  . BREAST SURGERY    . CARPAL TUNNEL RELEASE    . DILATION AND CURETTAGE OF UTERUS    . EXPLORATORY LAPAROTOMY    . LIPOMA EXCISION Left 06/15/2018   Procedure: LEFT CALF LIPOMA EXCISION;  Surgeon: Marybelle Killings, MD;  Location: [facility];  Service: Orthopedics;  Laterality: Left;  . NASAL SINUS SURGERY  07/22/13   Dr. Redmond Pulling in [place]  . plate and six screws in back    . TRIGGER FINGER RELEASE Right 06/15/2018   Procedure: RIGHT TRIGGER THUMB RELEASE;  Surgeon: Marybelle Killings, MD;  Location: [facility];  Service: Orthopedics;  Laterality: Right;   Social History   Occupational History  . Occupation: Unemployed  Tobacco Use  . Smoking status: Former Smoker    Packs/day: 0.25    Years: 5.00    Pack years: 1.25    Types: Cigarettes    Quit date: 11/18/1989    Years since quitting: 29.6  . Smokeless tobacco: Never Used  Substance and Sexual Activity  . Alcohol use: No  . Drug use: No  . Sexual activity: Yes    Birth control/protection: None

## 2019-07-22 NOTE — Progress Notes (Signed)
Jennifer Mullins - 48 y.o. female MRN [id number]  Date of birth: [birth year]  Office Visit Note: Visit Date: 07/21/2019 PCP: Terald Sleeper, PA-C Referred by: Terald Sleeper, PA-C  Subjective: Chief Complaint  Patient presents with  . Left Hand - Numbness   HPI: Jennifer Mullins is a 48 y.o. female who comes in today For planned left upper extremity electrodiagnostic study as requested by Dr. Rodell Perna.  Patient reports 8 out of 10 pain with numbness in the fourth and fifth digits on the left hand with some referral into the left arm and into the neck.  This seems to be referring up for his without specific radicular pain leading down from the neck.  She does report dropping objects recently with weakness.  She does report pain with hand movement.  She is right-hand dominant.  She reports starting to get some symptoms on the right.  She has had a history of prior left carpal tunnel release by Dr. Fredna Dow I believe.  She has had electrodiagnostic study that I did find on the old medical record system and this was performed at Dr. Levell July office in 2014 and showed mild bilateral median neuropathy.  She is also had ACDF by Dr. Lorin Mercy in 2017 at levels C5-6 and C6-7.  No recent MRI that I could see at least quickly in the medical record.  Her case is further complicated by type 2 diabetes non-insulin-dependent as well as history of fibromyalgia and hydrocodone use.  ROS Otherwise per HPI.  Assessment & Plan: Visit Diagnoses:  1. Paresthesia of skin     Plan: Impression: Essentially NORMAL electrodiagnostic study of the left upper limb.  There is no significant electrodiagnostic evidence of nerve entrapment, brachial plexopathy or cervical radiculopathy.   As you know, purely sensory or demyelinating radiculopathies and chemical radiculitis may not be detected with this particular electrodiagnostic study.   **This electrodiagnostic study cannot rule out small fiber polyneuropathy and dysesthesias from  central pain syndromes such as stroke or central pain sensitization syndromes such as fibromyalgia.  Myotomal referral pain from trigger points is also not excluded.  Recommendations: 1.  Follow-up with referring physician. 2.  Continue current management of symptoms.  Meds & Orders: No orders of the defined types were placed in this encounter.   Orders Placed This Encounter  Procedures  . NCV with EMG (electromyography)    Follow-up: Return for Rodell Perna, MD.   Procedures: No procedures performed  EMG & NCV Findings: All nerve conduction studies (as indicated in the following tables) were within normal limits.    All examined muscles (as indicated in the following table) showed no evidence of electrical instability.    Impression: Essentially NORMAL electrodiagnostic study of the left upper limb.  There is no significant electrodiagnostic evidence of nerve entrapment, brachial plexopathy or cervical radiculopathy.   As you know, purely sensory or demyelinating radiculopathies and chemical radiculitis may not be detected with this particular electrodiagnostic study.   **This electrodiagnostic study cannot rule out small fiber polyneuropathy and dysesthesias from central pain syndromes such as stroke or central pain sensitization syndromes such as fibromyalgia.  Myotomal referral pain from trigger points is also not excluded.  Recommendations: 1.  Follow-up with referring physician. 2.  Continue current management of symptoms.  ___________________________ Wonda [employer]s Board Certified, American Board of Physical Medicine and Rehabilitation    Nerve Conduction Studies Anti Sensory Summary Table   Stim Site NR Peak (ms) Norm Peak (ms) P-T  Amp (V) Norm P-T Amp Site1 Site2 Delta-P (ms) Dist (cm) Vel (m/s) Norm Vel (m/s)  Left Median Acr Palm Anti Sensory (2nd Digit)  32.1C  Wrist    3.5 <3.6 27.6 >10 Wrist Palm 1.8 0.0    Palm    1.7 <2.0 38.1         Left Radial Anti  Sensory (Base 1st Digit)  32C  Wrist    1.9 <3.1 30.9  Wrist Base 1st Digit 1.9 0.0    Left Ulnar Anti Sensory (5th Digit)  32.3C  Wrist    3.1 <3.7 32.5 >15.0 Wrist 5th Digit 3.1 14.0 45 >38   Motor Summary Table   Stim Site NR Onset (ms) Norm Onset (ms) O-P Amp (mV) Norm O-P Amp Site1 Site2 Delta-0 (ms) Dist (cm) Vel (m/s) Norm Vel (m/s)  Left Median Motor (Abd Poll Brev)  32.1C  Wrist    3.6 <4.2 8.7 >5 Elbow Wrist 3.7 20.0 54 >50  Elbow    7.3  9.2         Left Ulnar Motor (Abd Dig Min)  32.3C  Wrist    2.7 <4.2 9.1 >3 B Elbow Wrist 3.2 19.5 61 >53  B Elbow    5.9  8.6  A Elbow B Elbow 1.4 10.0 71 >53  A Elbow    7.3  8.8          EMG   Side Muscle Nerve Root Ins Act Fibs Psw Amp Dur Poly Recrt Int Fraser Din Comment  Left Abd Poll Brev Median C8-T1 Nml Nml Nml Nml Nml 0 Nml Nml   Left 1stDorInt Ulnar C8-T1 Nml Nml Nml Nml Nml 0 Nml Nml   Left PronatorTeres Median C6-7 Nml Nml Nml Nml Nml 0 Nml Nml   Left Biceps Musculocut C5-6 Nml Nml Nml Nml Nml 0 Nml Nml   Left Deltoid Axillary C5-6 Nml Nml Nml Nml Nml 0 Nml Nml     Nerve Conduction Studies Anti Sensory Left/Right Comparison   Stim Site L Lat (ms) R Lat (ms) L-R Lat (ms) L Amp (V) R Amp (V) L-R Amp (%) Site1 Site2 L Vel (m/s) R Vel (m/s) L-R Vel (m/s)  Median Acr Palm Anti Sensory (2nd Digit)  32.1C  Wrist 3.5   27.6   Wrist Palm     Palm 1.7   38.1         Radial Anti Sensory (Base 1st Digit)  32C  Wrist 1.9   30.9   Wrist Base 1st Digit     Ulnar Anti Sensory (5th Digit)  32.3C  Wrist 3.1   32.5   Wrist 5th Digit 45     Motor Left/Right Comparison   Stim Site L Lat (ms) R Lat (ms) L-R Lat (ms) L Amp (mV) R Amp (mV) L-R Amp (%) Site1 Site2 L Vel (m/s) R Vel (m/s) L-R Vel (m/s)  Median Motor (Abd Poll Brev)  32.1C  Wrist 3.6   8.7   Elbow Wrist 54    Elbow 7.3   9.2         Ulnar Motor (Abd Dig Min)  32.3C  Wrist 2.7   9.1   B Elbow Wrist 61    B Elbow 5.9   8.6   A Elbow B Elbow 71    A Elbow 7.3   8.8             Waveforms:            Clinical History:  No specialty comments available.   She reports that she quit smoking about 29 years ago. Her smoking use included cigarettes. She has a 1.25 pack-year smoking history. She has never used smokeless tobacco.  Recent Labs    01/06/19 0925 07/05/19 0921  HGBA1C 5.0 5.8    Objective:  VS:  HT:    WT:   BMI:     BP:   HR: bpm  TEMP: ( )  RESP:  Physical Exam Musculoskeletal:        General: No swelling, tenderness or deformity.     Comments: Inspection reveals no atrophy of the bilateral APB or FDI or hand intrinsics. There is no swelling, color changes, allodynia or dystrophic changes. There is 5 out of 5 strength in the bilateral wrist extension, finger abduction and long finger flexion. There is intact sensation to light touch in all dermatomal and peripheral nerve distributions.  There is a negative Hoffmann's test bilaterally.  Skin:    General: Skin is warm and dry.     Findings: No erythema or rash.  Neurological:     General: No focal deficit present.     Mental Status: She is alert and oriented to person, place, and time.     Motor: No weakness or abnormal muscle tone.     Coordination: Coordination normal.  Psychiatric:        Mood and Affect: Mood normal.        Behavior: Behavior normal.     Ortho Exam Imaging: No results found.  Past Medical/Family/Surgical/Social History: Medications & Allergies reviewed per EMR, new medications updated. Patient Active Problem List   Diagnosis Date Noted  . Numbness of left hand 07/06/2019  . Rectal bleeding 02/01/2019  . Acne vulgaris 02/01/2019  . Hirsutism 01/29/2019  . Hypokalemia 01/13/2019  . Acute blood loss anemia 01/13/2019  . Symptomatic anemia   . Fibromyalgia 07/09/2018  . Trigger thumb, right thumb 06/15/2018  . Lipoma of lower extremity 06/15/2018  . 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 Dyspareunia, female 12/11/2017  . Menometrorrhagia  12/11/2017  . Pain in right ankle and joints of right foot 10/29/2017  . Chronic pain of right knee 10/29/2017  . Iron deficiency anemia due to chronic blood loss 07/07/2017  . S/P cervical spinal fusion 02/27/2017  . Dysfunctional uterine bleeding 11/24/2016  . Anticoagulated 11/24/2016  . Allergic rhinoconjunctivitis 07/28/2015  . Chronic headache 07/28/2015  . PCOS (polycystic ovarian syndrome) 06/20/2015  . 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 Hyperlipidemia 06/20/2015  . GAD (generalized anxiety disorder) 05/18/2014  . GERD (gastroesophageal reflux disease) 05/18/2014  . Chronic sinusitis 07/02/2013  . Extrinsic asthma 05/20/2013  . HTN (hypertension) 05/20/2013   Past Medical History:  Diagnosis Date  . Anxiety   . Arthritis   . Asthma   . Bilateral calcaneal spurs   . Bilateral polycystic ovarian syndrome   . Bulging lumbar disc   . Carpal tunnel syndrome, bilateral   . COPD (chronic obstructive pulmonary disease) (HCC)    BRONCHITIS  . Diabetes mellitus without complication ([facility])    TYPE 2   DX  4-5 YRS AGO  . ETD (Eustachian tube dysfunction), bilateral    left worse than right  . Fibromyalgia   . GAD (generalized anxiety disorder)   . GERD (gastroesophageal reflux disease)    TAKES PRESCRIPTION MEDS  . Glaucoma    BOTH EYES  . Hyperlipidemia   . Hypertension   . Hypothyroidism    NODULES ON THYROID  .  PONV (postoperative nausea and vomiting)   . Pulmonary embolism ([facility])   . Tachycardia   . TMJ (dislocation of temporomandibular joint)   . Trigger finger   . Trigger finger of left thumb    Family History  Problem Relation Age of Onset  . Hypertension Mother   . Hypertension Father   . Colon cancer Neg Hx   . Rectal cancer Neg Hx   . Stomach cancer Neg Hx   . Esophageal cancer Neg Hx    Past Surgical History:  Procedure Laterality Date  . ANTERIOR CERVICAL DECOMP/DISCECTOMY FUSION N/A 11/13/2016   Procedure: Cervical five-six, Cervical  six-seven Anterior Cervical Discectomy and Fusion, Allograft, Plate;  Surgeon: Marybelle Killings, MD;  Location: [facility];  Service: Orthopedics;  Laterality: N/A;  . BREAST SURGERY    . CARPAL TUNNEL RELEASE    . DILATION AND CURETTAGE OF UTERUS    . EXPLORATORY LAPAROTOMY    . LIPOMA EXCISION Left 06/15/2018   Procedure: LEFT CALF LIPOMA EXCISION;  Surgeon: Marybelle Killings, MD;  Location: [facility];  Service: Orthopedics;  Laterality: Left;  . NASAL SINUS SURGERY  07/22/13   Dr. Redmond Pulling in [place]  . plate and six screws in back    . TRIGGER FINGER RELEASE Right 06/15/2018   Procedure: RIGHT TRIGGER THUMB RELEASE;  Surgeon: Marybelle Killings, MD;  Location: [facility];  Service: Orthopedics;  Laterality: Right;   Social History   Occupational History  . Occupation: Unemployed  Tobacco Use  . Smoking status: Former Smoker    Packs/day: 0.25    Years: 5.00    Pack years: 1.25    Types: Cigarettes    Quit date: 11/18/1989    Years since quitting: 29.6  . Smokeless tobacco: Never Used  Substance and Sexual Activity  . Alcohol use: No  . Drug use: No  . Sexual activity: Yes    Birth control/protection: None

## 2019-07-29 ENCOUNTER — Ambulatory Visit: Payer: BC Managed Care – PPO | Admitting: Podiatry

## 2019-07-29 ENCOUNTER — Other Ambulatory Visit: Payer: BC Managed Care – PPO | Admitting: Orthotics

## 2019-07-30 ENCOUNTER — Ambulatory Visit (INDEPENDENT_AMBULATORY_CARE_PROVIDER_SITE_OTHER): Payer: BC Managed Care – PPO

## 2019-07-30 DIAGNOSIS — J309 Allergic rhinitis, unspecified: Secondary | ICD-10-CM

## 2019-08-03 ENCOUNTER — Other Ambulatory Visit: Payer: Self-pay | Admitting: Physician Assistant

## 2019-08-03 ENCOUNTER — Encounter: Payer: Self-pay | Admitting: Podiatry

## 2019-08-03 ENCOUNTER — Ambulatory Visit (INDEPENDENT_AMBULATORY_CARE_PROVIDER_SITE_OTHER): Payer: BC Managed Care – PPO | Admitting: Podiatry

## 2019-08-03 ENCOUNTER — Ambulatory Visit (INDEPENDENT_AMBULATORY_CARE_PROVIDER_SITE_OTHER): Payer: BC Managed Care – PPO | Admitting: *Deleted

## 2019-08-03 ENCOUNTER — Other Ambulatory Visit: Payer: Self-pay

## 2019-08-03 ENCOUNTER — Ambulatory Visit (INDEPENDENT_AMBULATORY_CARE_PROVIDER_SITE_OTHER): Payer: BC Managed Care – PPO

## 2019-08-03 DIAGNOSIS — M722 Plantar fascial fibromatosis: Secondary | ICD-10-CM

## 2019-08-03 DIAGNOSIS — S92535A Nondisplaced fracture of distal phalanx of left lesser toe(s), initial encounter for closed fracture: Secondary | ICD-10-CM

## 2019-08-03 DIAGNOSIS — S90122A Contusion of left lesser toe(s) without damage to nail, initial encounter: Secondary | ICD-10-CM

## 2019-08-03 DIAGNOSIS — J4550 Severe persistent asthma, uncomplicated: Secondary | ICD-10-CM

## 2019-08-03 DIAGNOSIS — R635 Abnormal weight gain: Secondary | ICD-10-CM

## 2019-08-04 ENCOUNTER — Telehealth: Payer: Self-pay | Admitting: Physician Assistant

## 2019-08-04 ENCOUNTER — Encounter: Payer: Self-pay | Admitting: Podiatry

## 2019-08-04 NOTE — Telephone Encounter (Signed)
Aware. 

## 2019-08-04 NOTE — Telephone Encounter (Signed)
She just wants it upped until her toe is better and wants [place] permission.

## 2019-08-04 NOTE — Progress Notes (Signed)
She presents today chief complaint of pain to the fifth toe left foot states that she dropped a can of food on it Sunday she states that is bruised and swollen and tender.  She is also complaining of pain to the right heel.  She would like a new pair of orthotics.  ROS: Denies fever chills nausea vomiting muscle aches pains calf pain back pain chest pain shortness of breath.  Reviewed her past medical history medications allergies surgery social history denies fever chills nausea vomiting muscle aches and pains.  Objective: Vital signs are stable she is alert and oriented x3 edema and ecchymosis is noted to the fifth toe left foot.  Pulses are palpable.  She has pain on palpation medial calcaneal tubercle of the right heel.  Neurologic sensorium is intact degenerative flexors are intact muscle strength is normal symmetrical.  Cutaneous evaluation which is supple well-hydrated cutis mild edema left overlying the fourth and fifth toes with ecchymosis no erythema cellulitis drainage odor.  Radiographs taken today demonstrate fracture distal phalanx fifth toe left.  Assessment fracture fifth toe left nondisplaced.  Plantar fasciitis right.  Plan: She was scanned for orthotics today she also had an injection to her right heel after sterile Betadine skin prep injected 20 mg Kenalog 5 mg Marcaine point of maximal tenderness of the right heel.  Also showed her how to tape the fourth and fifth toes individually.  Follow-up with her in about a month when her orthotics come in.

## 2019-08-04 NOTE — Telephone Encounter (Signed)
A broken bone does not need pain medicine the entire time it heals.  She may take the medication for 1 week at the most.

## 2019-08-06 ENCOUNTER — Ambulatory Visit (INDEPENDENT_AMBULATORY_CARE_PROVIDER_SITE_OTHER): Payer: BC Managed Care – PPO | Admitting: *Deleted

## 2019-08-06 DIAGNOSIS — J309 Allergic rhinitis, unspecified: Secondary | ICD-10-CM | POA: Diagnosis not present

## 2019-08-10 ENCOUNTER — Other Ambulatory Visit: Payer: BC Managed Care – PPO | Admitting: Orthotics

## 2019-08-12 ENCOUNTER — Ambulatory Visit (INDEPENDENT_AMBULATORY_CARE_PROVIDER_SITE_OTHER): Payer: BC Managed Care – PPO

## 2019-08-12 DIAGNOSIS — J309 Allergic rhinitis, unspecified: Secondary | ICD-10-CM | POA: Diagnosis not present

## 2019-08-15 ENCOUNTER — Other Ambulatory Visit: Payer: Self-pay | Admitting: Physician Assistant

## 2019-08-18 ENCOUNTER — Other Ambulatory Visit: Payer: Self-pay | Admitting: Physician Assistant

## 2019-08-18 ENCOUNTER — Telehealth: Payer: Self-pay | Admitting: Physician Assistant

## 2019-08-18 MED ORDER — BACLOFEN 10 MG PO TABS: 10.0000 mg | ORAL_TABLET | Freq: Three times a day (TID) | ORAL | 5 refills | Status: DC | PRN

## 2019-08-18 NOTE — Telephone Encounter (Signed)
Script is sent 

## 2019-08-19 ENCOUNTER — Encounter

## 2019-08-19 ENCOUNTER — Other Ambulatory Visit: Payer: BC Managed Care – PPO | Admitting: Orthotics

## 2019-08-19 ENCOUNTER — Ambulatory Visit: Payer: BC Managed Care – PPO | Admitting: Podiatry

## 2019-08-19 NOTE — Telephone Encounter (Signed)
Left message stating Rx has been sent to pharmacy and to call back with any questions or concerns.

## 2019-08-20 ENCOUNTER — Ambulatory Visit (INDEPENDENT_AMBULATORY_CARE_PROVIDER_SITE_OTHER): Payer: BC Managed Care – PPO | Admitting: *Deleted

## 2019-08-20 DIAGNOSIS — J309 Allergic rhinitis, unspecified: Secondary | ICD-10-CM

## 2019-08-27 ENCOUNTER — Ambulatory Visit (INDEPENDENT_AMBULATORY_CARE_PROVIDER_SITE_OTHER): Payer: BC Managed Care – PPO | Admitting: *Deleted

## 2019-08-27 DIAGNOSIS — J309 Allergic rhinitis, unspecified: Secondary | ICD-10-CM | POA: Diagnosis not present

## 2019-08-31 ENCOUNTER — Ambulatory Visit (INDEPENDENT_AMBULATORY_CARE_PROVIDER_SITE_OTHER): Payer: BC Managed Care – PPO

## 2019-08-31 ENCOUNTER — Other Ambulatory Visit: Payer: Self-pay

## 2019-08-31 ENCOUNTER — Other Ambulatory Visit: Payer: BC Managed Care – PPO | Admitting: Orthotics

## 2019-08-31 ENCOUNTER — Ambulatory Visit (INDEPENDENT_AMBULATORY_CARE_PROVIDER_SITE_OTHER): Payer: BC Managed Care – PPO | Admitting: *Deleted

## 2019-08-31 ENCOUNTER — Encounter: Payer: Self-pay | Admitting: Podiatry

## 2019-08-31 ENCOUNTER — Ambulatory Visit (INDEPENDENT_AMBULATORY_CARE_PROVIDER_SITE_OTHER): Payer: BC Managed Care – PPO | Admitting: Podiatry

## 2019-08-31 DIAGNOSIS — S92535D Nondisplaced fracture of distal phalanx of left lesser toe(s), subsequent encounter for fracture with routine healing: Secondary | ICD-10-CM

## 2019-08-31 DIAGNOSIS — M722 Plantar fascial fibromatosis: Secondary | ICD-10-CM

## 2019-08-31 DIAGNOSIS — J4550 Severe persistent asthma, uncomplicated: Secondary | ICD-10-CM

## 2019-08-31 NOTE — Patient Instructions (Addendum)
CONTRAST FOOT SOAKS  1. Start with 2 tubs of water: 1 filled with cold water and the other filled with warm water.  2. Soak foot in warm water for 2-3 minutes.  3. Soak foot in cold water for 7 minutes.  4. Repeat the above steps for a total of 3 times in each tub of water.  **ALWAYS END IN COLD WATER**

## 2019-09-01 NOTE — Progress Notes (Signed)
She presents today for follow-up of her fracture of her fifth toe left states that is still bit sore in a follow-up of the right heel which she states is 100% improved.  Objective: Vital signs are stable she is alert and oriented x3.  There is no erythema edema cellulitis drainage or odor she still has tenderness on palpation of the fifth toe which radiographically today demonstrates well-healing distal phalanx.  She has no pain on palpation medial calcaneal tubercle of the right heel.  Assessment: Resolving fracture fourth fifth digit left foot.  Resolving plantar fasciitis.  Plan: Continue to wrap the fifth digit on the left foot until it resolves.  Follow-up with me in a few weeks for another set of x-rays.

## 2019-09-02 ENCOUNTER — Ambulatory Visit: Payer: BC Managed Care – PPO | Admitting: Surgery

## 2019-09-03 ENCOUNTER — Ambulatory Visit (INDEPENDENT_AMBULATORY_CARE_PROVIDER_SITE_OTHER): Payer: BC Managed Care – PPO

## 2019-09-03 DIAGNOSIS — J309 Allergic rhinitis, unspecified: Secondary | ICD-10-CM | POA: Diagnosis not present

## 2019-09-10 ENCOUNTER — Ambulatory Visit (INDEPENDENT_AMBULATORY_CARE_PROVIDER_SITE_OTHER): Payer: BC Managed Care – PPO

## 2019-09-10 DIAGNOSIS — J309 Allergic rhinitis, unspecified: Secondary | ICD-10-CM | POA: Diagnosis not present

## 2019-09-13 ENCOUNTER — Other Ambulatory Visit: Payer: BC Managed Care – PPO | Admitting: Orthotics

## 2019-09-15 ENCOUNTER — Other Ambulatory Visit: Payer: BC Managed Care – PPO | Admitting: Orthotics

## 2019-09-17 ENCOUNTER — Ambulatory Visit (INDEPENDENT_AMBULATORY_CARE_PROVIDER_SITE_OTHER): Payer: BC Managed Care – PPO | Admitting: *Deleted

## 2019-09-17 DIAGNOSIS — J309 Allergic rhinitis, unspecified: Secondary | ICD-10-CM

## 2019-09-21 ENCOUNTER — Other Ambulatory Visit: Payer: Self-pay | Admitting: Physician Assistant

## 2019-09-21 ENCOUNTER — Other Ambulatory Visit: Payer: Self-pay | Admitting: Allergy and Immunology

## 2019-09-21 DIAGNOSIS — M797 Fibromyalgia: Secondary | ICD-10-CM

## 2019-09-23 ENCOUNTER — Other Ambulatory Visit: Payer: BC Managed Care – PPO | Admitting: Orthotics

## 2019-09-28 ENCOUNTER — Ambulatory Visit: Payer: BC Managed Care – PPO | Admitting: Podiatry

## 2019-09-28 ENCOUNTER — Other Ambulatory Visit: Payer: Self-pay | Admitting: Physician Assistant

## 2019-09-28 ENCOUNTER — Other Ambulatory Visit: Payer: Self-pay

## 2019-09-28 ENCOUNTER — Ambulatory Visit (INDEPENDENT_AMBULATORY_CARE_PROVIDER_SITE_OTHER): Payer: BC Managed Care – PPO | Admitting: *Deleted

## 2019-09-28 ENCOUNTER — Other Ambulatory Visit: Payer: BC Managed Care – PPO | Admitting: Orthotics

## 2019-09-28 DIAGNOSIS — J4550 Severe persistent asthma, uncomplicated: Secondary | ICD-10-CM

## 2019-09-28 DIAGNOSIS — Z981 Arthrodesis status: Secondary | ICD-10-CM

## 2019-10-01 ENCOUNTER — Ambulatory Visit (INDEPENDENT_AMBULATORY_CARE_PROVIDER_SITE_OTHER): Payer: BC Managed Care – PPO

## 2019-10-01 ENCOUNTER — Other Ambulatory Visit: Payer: BC Managed Care – PPO | Admitting: Orthotics

## 2019-10-01 DIAGNOSIS — J309 Allergic rhinitis, unspecified: Secondary | ICD-10-CM | POA: Diagnosis not present

## 2019-10-04 ENCOUNTER — Other Ambulatory Visit: Payer: Self-pay

## 2019-10-04 ENCOUNTER — Telehealth: Payer: Self-pay | Admitting: Podiatry

## 2019-10-04 ENCOUNTER — Ambulatory Visit: Payer: BC Managed Care – PPO | Admitting: Orthotics

## 2019-10-04 DIAGNOSIS — M722 Plantar fascial fibromatosis: Secondary | ICD-10-CM

## 2019-10-04 DIAGNOSIS — S92535D Nondisplaced fracture of distal phalanx of left lesser toe(s), subsequent encounter for fracture with routine healing: Secondary | ICD-10-CM

## 2019-10-04 NOTE — Telephone Encounter (Signed)
Pt called back and is not available for the 415 or 830am on Thursday but is already scheduled to see Dr Milinda Pointer and Liliane Channel on 11.24.2020.. I told pt to call late Wednesday or early Friday am and see if we had an cxled appt.

## 2019-10-04 NOTE — Telephone Encounter (Signed)
Pt left voicemail stating the adjustments Liliane Channel did today are not working. Right foot is killing her esp across the top. She asked if Liliane Channel could see her this week but her schedule is tight. She is available Friday, Thursday after 330pm and after 1pm on Wednesday..   I called pt and the phone just rings.

## 2019-10-04 NOTE — Progress Notes (Signed)
Trimmed f/o to make more sulcus than before

## 2019-10-05 ENCOUNTER — Encounter: Payer: Self-pay | Admitting: Physician Assistant

## 2019-10-05 ENCOUNTER — Ambulatory Visit (INDEPENDENT_AMBULATORY_CARE_PROVIDER_SITE_OTHER): Payer: BC Managed Care – PPO | Admitting: Physician Assistant

## 2019-10-05 DIAGNOSIS — T7840XS Allergy, unspecified, sequela: Secondary | ICD-10-CM | POA: Diagnosis not present

## 2019-10-05 DIAGNOSIS — J4522 Mild intermittent asthma with status asthmaticus: Secondary | ICD-10-CM | POA: Diagnosis not present

## 2019-10-05 DIAGNOSIS — Z981 Arthrodesis status: Secondary | ICD-10-CM | POA: Diagnosis not present

## 2019-10-05 MED ORDER — HYDROCODONE-ACETAMINOPHEN 5-325 MG PO TABS: 1.0000 | ORAL_TABLET | Freq: Three times a day (TID) | ORAL | 0 refills | Status: DC | PRN

## 2019-10-05 MED ORDER — EPINEPHRINE 0.3 MG/0.3ML IJ SOAJ: 0.3000 mg | INTRAMUSCULAR | 5 refills | Status: DC | PRN

## 2019-10-05 MED ORDER — ALBUTEROL SULFATE HFA 108 (90 BASE) MCG/ACT IN AERS: INHALATION_SPRAY | RESPIRATORY_TRACT | 5 refills | Status: DC

## 2019-10-05 NOTE — Progress Notes (Signed)
VIALS EXP 10-04-20

## 2019-10-06 ENCOUNTER — Telehealth: Payer: Self-pay | Admitting: Physician Assistant

## 2019-10-06 DIAGNOSIS — J301 Allergic rhinitis due to pollen: Secondary | ICD-10-CM | POA: Diagnosis not present

## 2019-10-06 NOTE — Telephone Encounter (Signed)
Please let the patient know that I wrote the prescription for a 90 pill supply.  The typical instructions are 3 times a day for that number of pills.  However if she chooses to use an extra 1 on a particularly bad day of pain, that she will just have to take it away from a future date.  She gets 90 pills/month.  This was the agreement, and if she cannot do that she can go back to the pain clinic.  Is it really necessary for me to call and change the prescription?

## 2019-10-06 NOTE — Telephone Encounter (Signed)
Patient aware.

## 2019-10-08 IMAGING — US US PELVIS COMPLETE WITH TRANSVAGINAL
1 series · 13 of 25 positions shown · non-contrast
Comparison: None

CLINICAL DATA: Menorrhagia.



[Series 1: us pelvis complete with transvaginal · 0.25mm/px · 13 of 76 slices shown]
[im 1/76]
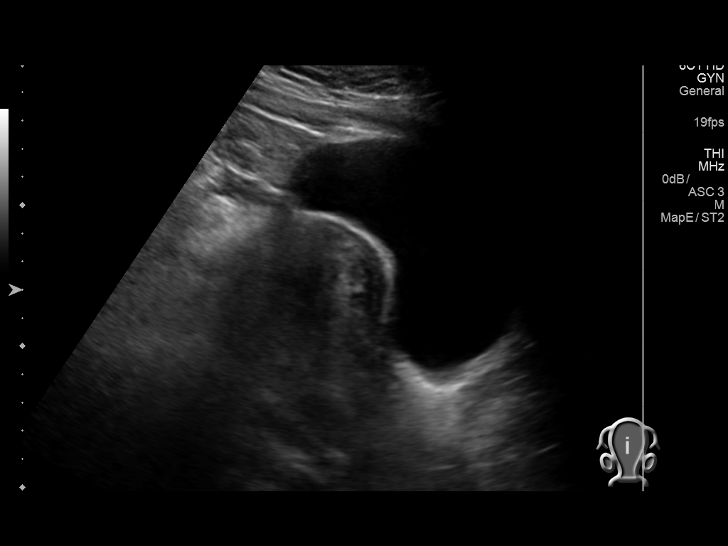
[im 7/76]
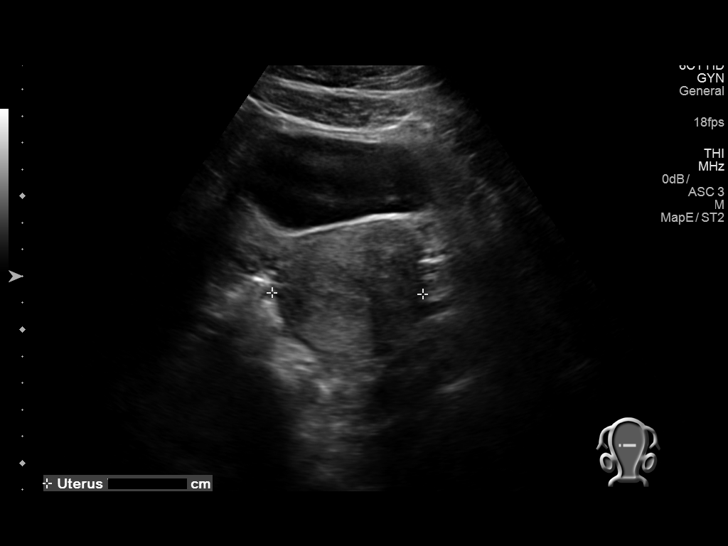
[im 13/76]
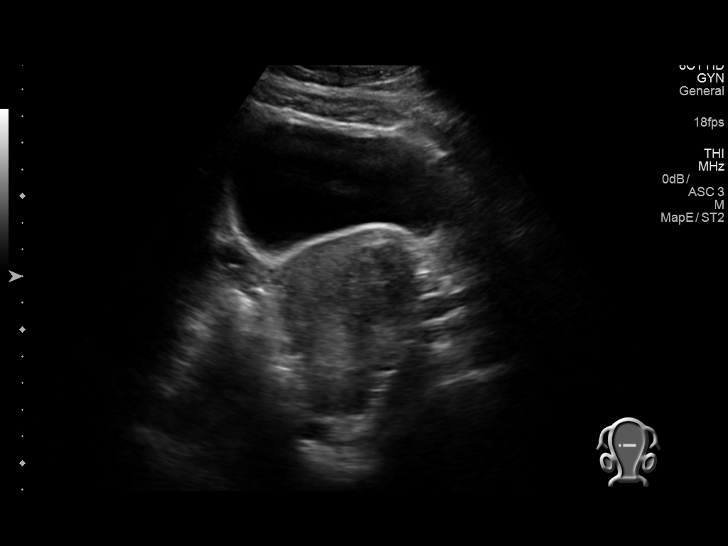
[im 19/76]
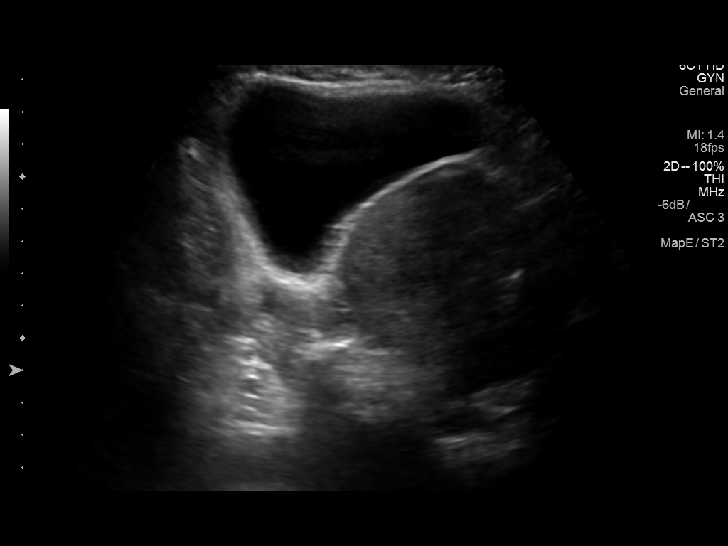
[im 26/76]
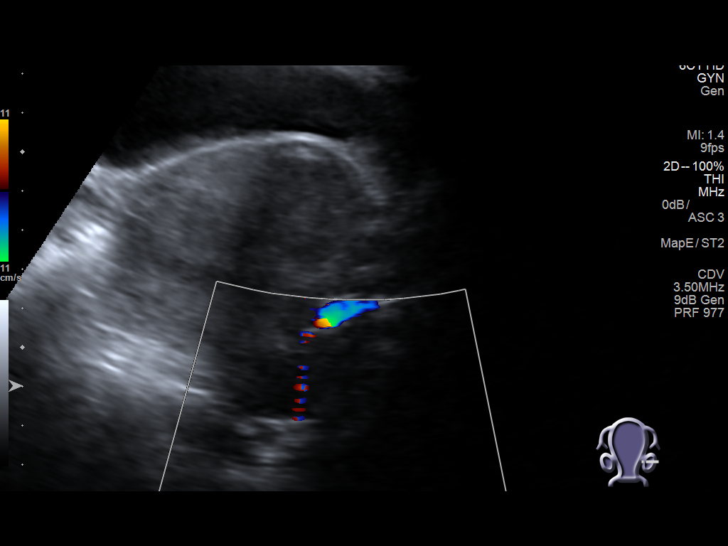
[im 32/76]
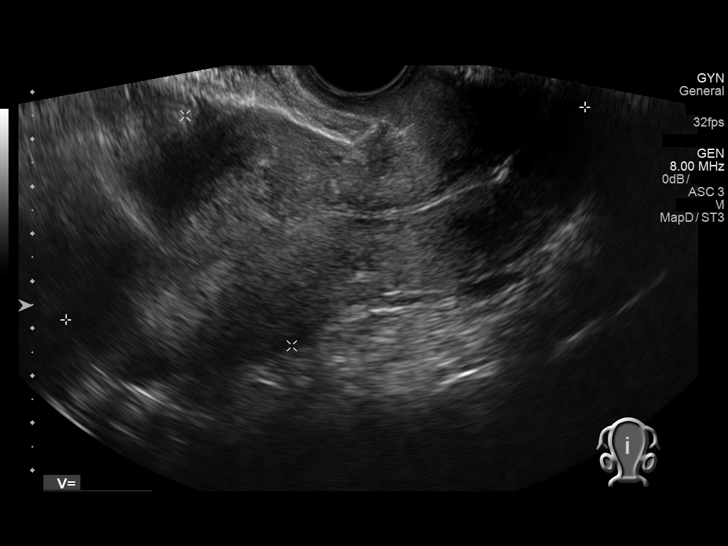
[im 38/76]
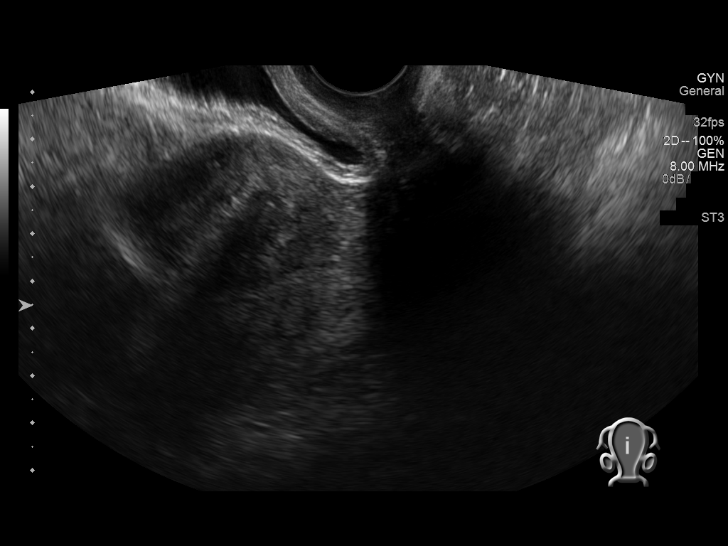
[im 44/76]
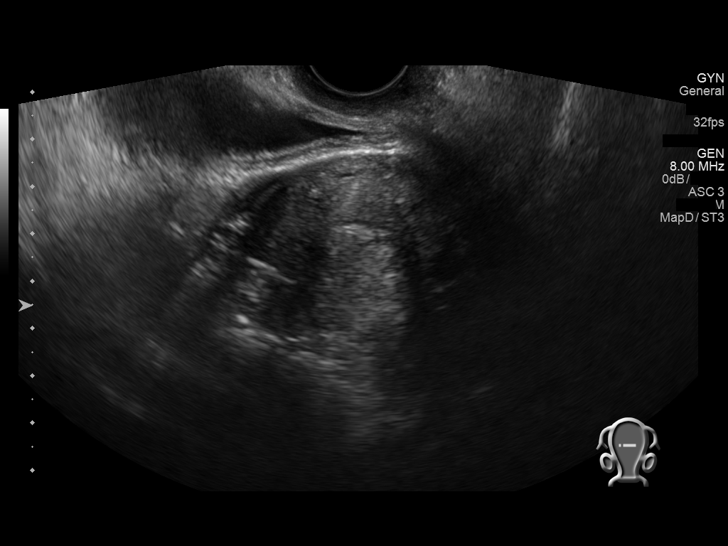
[im 51/76]
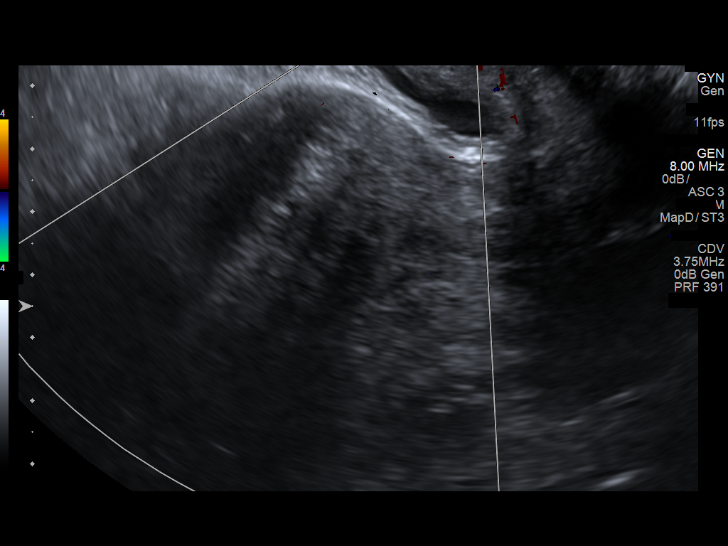
[im 57/76]
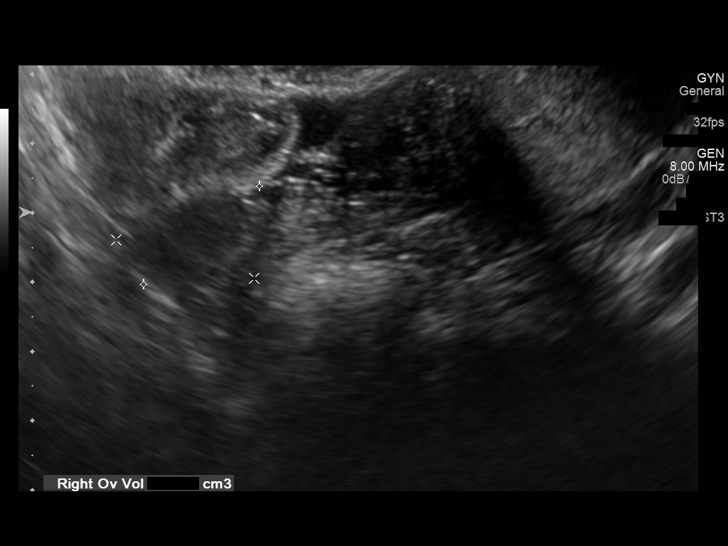
[im 63/76]
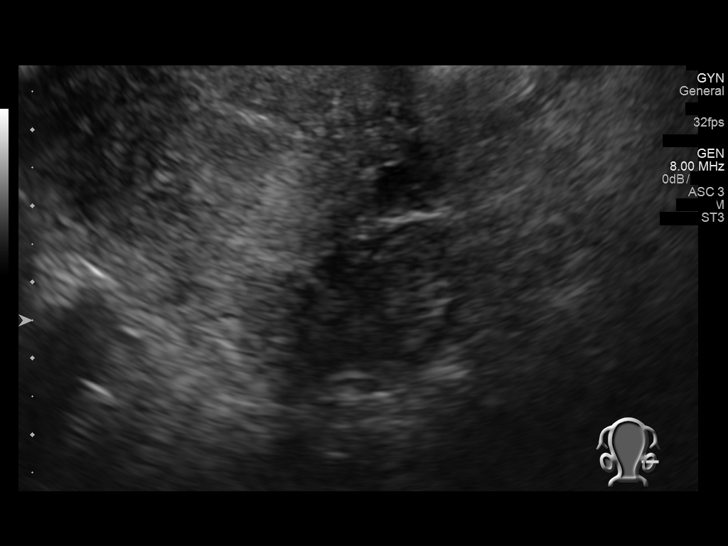
[im 69/76]
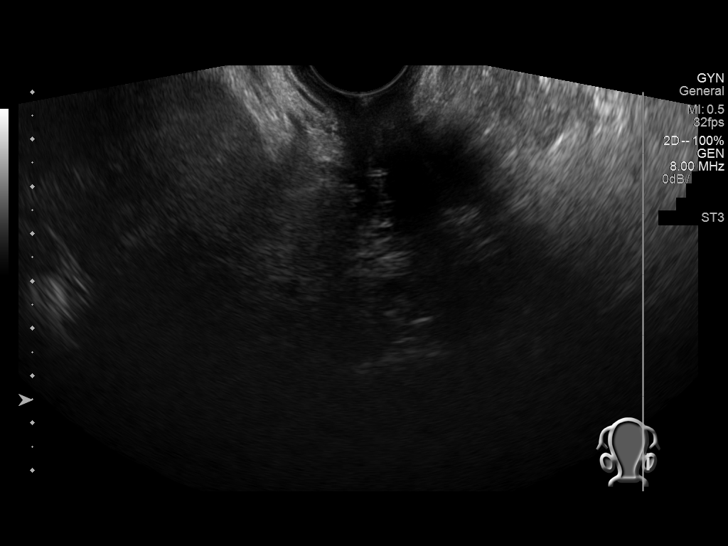
[im 76/76]
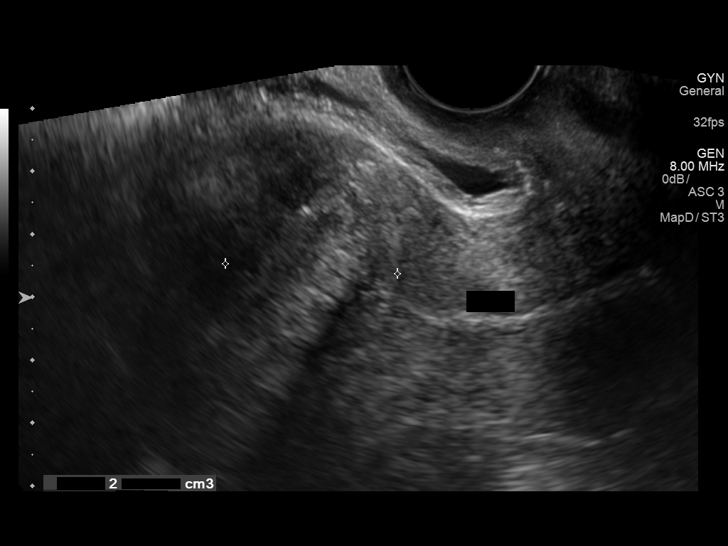

[13 of 25 positions shown; findings below may reference images not displayed]

FINDINGS: Uterus

Measurements: 11.9 x 5.4 x 4.1 cm = volume: 137.4 mL. Two fibroids
are identified. Both are in the fundus. The larger is more anterior
measuring 3.1 x 3.2 x 2.7 cm. Second fibroid which is more posterior
to the right measures 2.3 x 2.9 x 2.7 cm. The fibroids appear to be
subendometrial.

Endometrium

Thickness: 0.3 cm.  No focal abnormality visualized.

Right ovary

Measurements: 2.1 x 2.2 x 2.1 cm = volume: 5.0 mL. Normal
appearance/no adnexal mass.

Left ovary

Measurements: 4.0 x 2.2 x 2.0 cm = volume: 9.1 mL. Normal
appearance/no adnexal mass.

Other findings

No abnormal free fluid.
IMPRESSION: Two uterine fibroids are identified measuring 3.2 and 2.9 cm. Both
fibroids are in the upper uterine segment and fundus and appear to
abut the endometrial canal. The exam is otherwise negative.

## 2019-10-10 NOTE — Progress Notes (Signed)
Telephone visit  Subjective: [id number] chronic conditions PCP: Terald Sleeper, PA-C [id number] L Jennifer Mullins is a 48 y.o. female calls for telephone consult today. Patient provides verbal consent for consult held via phone.  Patient is identified with 2 separate identifiers.  At this time the entire area is on COVID-19 social distancing and stay home orders are in place.  Patient is of higher risk and therefore we are performing this by a virtual method.  Location of patient: home Location of provider: [facility] Others present for call: no  This patient had a visit for follow-up on her chronic medical conditions which do include degenerative disc disease of the cervical spine status post fusion with chronic pain.  She also has asthma and chronic allergies.  She does need refills on some of her medications.  And we will send those in.  At this time she states that she is fairly stable with all of her specialist.  And does not have any upcoming tests or procedures.   PAIN ASSESSMENT: Cause of pain- Impingement at L4-5 due to spondylosis and degenerative disc disease. Status post lower cervical ACDF with development of solid arthrodesis at C5-C6. Improved since 2018 but perhaps not yet fully   solid arthrodesis at C6-C7.  This patient returns for a 3 month recheck on narcotic use for the above named conditions  Current medications- hydrocodone 5/325 1 TID for pain Baclofen 10 mg 1 up to 3 times a day as needed spasm. Diclofenac gel Allergic: lyrica and tramadol Medication side effects- none Any concerns- no  Pain on scale of 1-10- 7 Frequency- most days What increases pain- standing What makes pain Better- rest Effects on ADL - moderate Any change in general medical condition- no  Effectiveness of current meds- good Adverse reactions form pain meds-no PMP AWARE website reviewed: Yes Any suspicious activity on PMP Aware: No MME daily dose: 15 LME daily dose: 2  Contract on  file 01/13/19 Last UDS  01/29/19  [facility] script sent or current  History of overdose or risk of abuse no  ROS: Per HPI  Allergies  Allergen Reactions  . Demerol [Meperidine] Shortness Of Breath    FLUSHING AND SHORTNESS OF BREATH  . Morphine And Related Shortness Of Breath    Flushed and hot hyper Flushed and hot hyper Flushed and hot hyper Flushed and hot hyper  . Other Shortness Of Breath    Flushed and hot hyper Flushed and hot hyper Flushed and hot hyper Flushed and hot hyper Flushed and hot hyper  . Cardizem  [Diltiazem Hcl] Other (See Comments)  . Diltiazem Hcl Hives  . Lisinopril     Caused cough  . Lyrica [Pregabalin]     Chest pain, elevated heart rate  . Tramadol Other (See Comments)    Tears up stomach   Past Medical History:  Diagnosis Date  . Anxiety   . Arthritis   . Asthma   . Bilateral calcaneal spurs   . Bilateral polycystic ovarian syndrome   . Bulging lumbar disc   . Carpal tunnel syndrome, bilateral   . COPD (chronic obstructive pulmonary disease) (HCC)    BRONCHITIS  . Diabetes mellitus without complication ([place])    TYPE 2   DX  4-5 YRS AGO  . ETD (Eustachian tube dysfunction), bilateral    left worse than right  . Fibromyalgia   . GAD (generalized anxiety disorder)   . GERD (gastroesophageal reflux disease)    TAKES PRESCRIPTION MEDS  .  Glaucoma    BOTH EYES  . Hyperlipidemia   . Hypertension   . Hypothyroidism    NODULES ON THYROID  . PONV (postoperative nausea and vomiting)   . Pulmonary embolism ([place])   . Tachycardia   . TMJ (dislocation of temporomandibular joint)   . Trigger finger   . Trigger finger of left thumb     Current Outpatient Medications:  .  adapalene (DIFFERIN) 0.1 % cream, Apply topically at bedtime., Disp: 45 g, Rfl: 5 .  albuterol (VENTOLIN HFA) 108 (90 Base) MCG/ACT inhaler, INHALE TWO PUFFS INTO THE LUNGS EVERY 6 HOURS AS NEEDED FOR WHEEZING OR SHORTNESS OF BREATH, Disp: 8.5 g, Rfl: 5 .  ALPRAZolam  (XANAX) 0.5 MG tablet, TAKE 1 TABLET BY MOUTH TWICE DAILY AS NEEDED. MAY TAKE ONE MORE TABLET BEFORE FOR ALLERGY SHOTS ONCE A WEEK (30 DAY supply), Disp: 65 tablet, Rfl: 5 .  aspirin 81 MG chewable tablet, Chew 81 mg by mouth daily., Disp: , Rfl:  .  baclofen (LIORESAL) 10 MG tablet, Take 1 tablet (10 mg total) by mouth 3 (three) times daily as needed., Disp: 90 tablet, Rfl: 5 .  cetirizine (ZYRTEC) 10 MG tablet, TAKE 1 TABLET BY MOUTH TWICE DAILY, Disp: 60 tablet, Rfl: 1 .  cromolyn (OPTICROM) 4 % ophthalmic solution, Place 1 drop into both eyes 4 (four) times daily. , Disp: , Rfl: 1 .  cycloSPORINE (RESTASIS) 0.05 % ophthalmic emulsion, Place 1 drop into both eyes 2 (two) times daily., Disp: , Rfl:  .  diclofenac sodium (VOLTAREN) 1 % GEL, APPLY TWO GRAMS TOPICALLY FOUR TIMES DAILY, Disp: 300 g, Rfl: 5 .  Diethylpropion HCl CR 75 MG TB24, Take 1 tablet (75 mg total) by mouth daily., Disp: 30 tablet, Rfl: 5 .  EPINEPHrine 0.3 mg/0.3 mL IJ SOAJ injection, Inject 0.3 mLs (0.3 mg total) into the muscle as needed for anaphylaxis., Disp: 1 each, Rfl: 5 .  fluconazole (DIFLUCAN) 150 MG tablet, 1 po q week x 4 weeks, Disp: 4 tablet, Rfl: 0 .  fluticasone (FLONASE) 50 MCG/ACT nasal spray, 1-2 sprays in each nostril 3 times weekly as needed., Disp: 16 g, Rfl: 5 .  furosemide (LASIX) 20 MG tablet, TAKE 1 TABLET BY MOUTH DAILY AS NEEDED FOR FLUID, Disp: 90 tablet, Rfl: 3 .  HYDROcodone-acetaminophen (NORCO) 5-325 MG tablet, Take 1 tablet by mouth every 8 (eight) hours as needed for severe pain., Disp: 90 tablet, Rfl: 0 .  HYDROcodone-acetaminophen (NORCO) 5-325 MG tablet, Take 1 tablet by mouth every 8 (eight) hours as needed for severe pain., Disp: 90 tablet, Rfl: 0 .  HYDROcodone-acetaminophen (NORCO) 5-325 MG tablet, Take 1 tablet by mouth every 8 (eight) hours as needed for severe pain., Disp: 90 tablet, Rfl: 0 .  iron polysaccharides (IFEREX 150) 150 MG capsule, Take 1 capsule (150 mg total) by mouth 2  (two) times daily., Disp: 60 capsule, Rfl: 5 .  LINZESS 145 MCG CAPS capsule, Take 1 capsule (145 mcg total) by mouth 2 (two) times daily., Disp: 30 capsule, Rfl: 5 .  losartan (COZAAR) 25 MG tablet, Take 1 tablet (25 mg total) by mouth daily., Disp: 90 tablet, Rfl: 3 .  medroxyPROGESTERone (PROVERA) 10 MG tablet, TAKE 1 TABLET BY MOUTH DAILY, Disp: 30 tablet, Rfl: 11 .  metFORMIN (GLUCOPHAGE) 500 MG tablet, TAKE 1 TABLET BY MOUTH EVERY DAY WITH BREAKFAST, Disp: 90 tablet, Rfl: 3 .  montelukast (SINGULAIR) 10 MG tablet, Take 1 tablet (10 mg total) by mouth at bedtime.,  Disp: 90 tablet, Rfl: 3 .  Multiple Vitamins-Minerals (SM COMPLETE ADVANCED FORMULA) TABS, Take 1 tablet by mouth daily. , Disp: , Rfl:  .  NUCALA 100 MG SOLR, inject 100 mg under the skin every 4 weeks (to be given by a healthcare provider), Disp: 1 each, Rfl: 10 .  Olopatadine HCl 0.6 % SOLN, INSTILL 2 SPRAYS IN EACH NOSTRIL TWICE DAILY, Disp: 30.5 g, Rfl: 5 .  pantoprazole (PROTONIX) 40 MG tablet, Take 1 tablet (40 mg total) by mouth every morning., Disp: 90 tablet, Rfl: 3 .  potassium chloride SA (K-DUR) 20 MEQ tablet, TAKE 1 TABLET BY MOUTH DAILY WHILE TAKING LASIX, Disp: 90 tablet, Rfl: 1 .  pravastatin (PRAVACHOL) 20 MG tablet, Take 1 tablet (20 mg total) by mouth at bedtime., Disp: 90 tablet, Rfl: 3 .  Scar Treatment Products (CELACYN) GEL, Apply 1 application topically at bedtime., Disp: 28 g, Rfl: 5 .  SYMBICORT 80-4.5 MCG/ACT inhaler, Inhale 2 puffs into the lungs daily., Disp: 1 Inhaler, Rfl: 12 .  VANIQA 13.9 % cream, APPLY TO AFFECTED AREA TWICE DAILY, Disp: 45 g, Rfl: 5  Current Facility-Administered Medications:  Marland Kitchen  Mepolizumab SOLR 100 mg, 100 mg, Subcutaneous, Q28 days, Kozlow, Donnamarie Poag, MD, 100 mg at 09/28/19 1357  Assessment/ Plan: 48 y.o. female   1. S/P cervical spinal fusion - HYDROcodone-acetaminophen (NORCO) 5-325 MG tablet; Take 1 tablet by mouth every 8 (eight) hours as needed for severe pain.  Dispense:  90 tablet; Refill: 0 - HYDROcodone-acetaminophen (NORCO) 5-325 MG tablet; Take 1 tablet by mouth every 8 (eight) hours as needed for severe pain.  Dispense: 90 tablet; Refill: 0 - HYDROcodone-acetaminophen (NORCO) 5-325 MG tablet; Take 1 tablet by mouth every 8 (eight) hours as needed for severe pain.  Dispense: 90 tablet; Refill: 0  2. Severe allergic reaction, sequela - EPINEPHrine 0.3 mg/0.3 mL IJ SOAJ injection; Inject 0.3 mLs (0.3 mg total) into the muscle as needed for anaphylaxis.  Dispense: 1 each; Refill: 5  3. Mild intermittent extrinsic asthma with status asthmaticus - albuterol (VENTOLIN HFA) 108 (90 Base) MCG/ACT inhaler; INHALE TWO PUFFS INTO THE LUNGS EVERY 6 HOURS AS NEEDED FOR WHEEZING OR SHORTNESS OF BREATH  Dispense: 8.5 g; Refill: 5   Return in about 3 months (around 01/05/2020).  Continue all other maintenance medications as listed above.  Start time: 9:02 AM End time: 9:15 AM  Meds ordered this encounter  Medications  . HYDROcodone-acetaminophen (NORCO) 5-325 MG tablet    Sig: Take 1 tablet by mouth every 8 (eight) hours as needed for severe pain.    Dispense:  90 tablet    Refill:  0    Fill 30 days from original script date    Order Specific Question:   Supervising Provider    Answer:   Janora Norlander [id number]  . albuterol (VENTOLIN HFA) 108 (90 Base) MCG/ACT inhaler    Sig: INHALE TWO PUFFS INTO THE LUNGS EVERY 6 HOURS AS NEEDED FOR WHEEZING OR SHORTNESS OF BREATH    Dispense:  8.5 g    Refill:  5    Order Specific Question:   Supervising Provider    Answer:   Janora Norlander [id number]  . EPINEPHrine 0.3 mg/0.3 mL IJ SOAJ injection    Sig: Inject 0.3 mLs (0.3 mg total) into the muscle as needed for anaphylaxis.    Dispense:  1 each    Refill:  5    Order Specific Question:   Supervising Provider  AnswerJanora Norlander [id number]  . HYDROcodone-acetaminophen (NORCO) 5-325 MG tablet    Sig: Take 1 tablet by mouth every 8 (eight) hours  as needed for severe pain.    Dispense:  90 tablet    Refill:  0    Fill 60 days from original script date    Order Specific Question:   Supervising Provider    Answer:   Janora Norlander [id number]  . HYDROcodone-acetaminophen (NORCO) 5-325 MG tablet    Sig: Take 1 tablet by mouth every 8 (eight) hours as needed for severe pain.    Dispense:  90 tablet    Refill:  0    Order Specific Question:   Supervising Provider    Answer:   Janora Norlander [id number]    Particia Nearing PA-C [place] [phone]

## 2019-10-12 ENCOUNTER — Ambulatory Visit (INDEPENDENT_AMBULATORY_CARE_PROVIDER_SITE_OTHER): Payer: BC Managed Care – PPO | Admitting: Podiatry

## 2019-10-12 ENCOUNTER — Encounter: Payer: Self-pay | Admitting: Podiatry

## 2019-10-12 ENCOUNTER — Other Ambulatory Visit: Payer: Self-pay

## 2019-10-12 ENCOUNTER — Ambulatory Visit: Payer: BC Managed Care – PPO | Admitting: Orthotics

## 2019-10-12 ENCOUNTER — Ambulatory Visit (INDEPENDENT_AMBULATORY_CARE_PROVIDER_SITE_OTHER): Payer: BC Managed Care – PPO

## 2019-10-12 ENCOUNTER — Other Ambulatory Visit: Payer: BC Managed Care – PPO | Admitting: Orthotics

## 2019-10-12 DIAGNOSIS — G5762 Lesion of plantar nerve, left lower limb: Secondary | ICD-10-CM | POA: Diagnosis not present

## 2019-10-12 DIAGNOSIS — S92535D Nondisplaced fracture of distal phalanx of left lesser toe(s), subsequent encounter for fracture with routine healing: Secondary | ICD-10-CM

## 2019-10-12 DIAGNOSIS — D3610 Benign neoplasm of peripheral nerves and autonomic nervous system, unspecified: Secondary | ICD-10-CM

## 2019-10-12 DIAGNOSIS — J309 Allergic rhinitis, unspecified: Secondary | ICD-10-CM

## 2019-10-12 DIAGNOSIS — M722 Plantar fascial fibromatosis: Secondary | ICD-10-CM

## 2019-10-12 NOTE — Progress Notes (Signed)
She presents today for follow-up of her fractured fifth toe of the left states that the fourth toe still sits on top of it and is a little tender.  States that is a little more sore right in here as she points to the third interdigital space.  Objective: Vital signs are stable she is alert and oriented x3 she has no tenderness on palpation of the fourth and fifth digit of the left foot.  She has a palpable Mulder's click third interdigital space left.  Radiographs taken today demonstrate an osseously mature individual no fractures are identified at this time.  Fourth toe appears to be healed.  Assessment: Neuroma third interspace left.  Plan: At this point I went ahead and injected the area with 10 mg Kenalog 5 mg Marcaine.  She tolerated procedure without complications.

## 2019-10-13 ENCOUNTER — Telehealth: Payer: Self-pay | Admitting: Podiatry

## 2019-10-13 NOTE — Telephone Encounter (Signed)
Pt called and left a message about orthotics that Liliane Channel is adjusting. She wants to leave the hole in the heel and cover it with a different material maybe leather type material all the way back, raise right arch and fix toes.  I returned call and made her aware Liliane Channel had left for the rest of the week but I would get him to call her Monday to discuss.

## 2019-10-13 NOTE — Progress Notes (Signed)
Sending f/o back again for further adjustments:  Take out heel punch, reduce arch 1/8" rt.  Sulcus b/l...remove bottom cover.

## 2019-10-19 ENCOUNTER — Telehealth: Payer: Self-pay | Admitting: Physician Assistant

## 2019-10-19 ENCOUNTER — Other Ambulatory Visit: Payer: Self-pay | Admitting: Physician Assistant

## 2019-10-19 MED ORDER — IPRATROPIUM BROMIDE 0.03 % NA SOLN: 2.0000 | Freq: Two times a day (BID) | NASAL | 1 refills | Status: DC

## 2019-10-19 MED ORDER — AZITHROMYCIN 250 MG PO TABS: ORAL_TABLET | ORAL | 0 refills | Status: DC

## 2019-10-19 NOTE — Telephone Encounter (Signed)
Is she just asking for congestion and sinus med?

## 2019-10-19 NOTE — Telephone Encounter (Signed)
Appt was made to discuss these phone messages

## 2019-10-19 NOTE — Telephone Encounter (Signed)
I routed to Jennifer Mullins and [facility] ( [place] for the paperwork )   Pt is wanting something for the congestion.

## 2019-10-19 NOTE — Telephone Encounter (Signed)
I have sent in a Z-Pak and Atrovent nose spray to her pharmacy.

## 2019-10-19 NOTE — Progress Notes (Signed)
z-pak 

## 2019-10-20 ENCOUNTER — Ambulatory Visit (INDEPENDENT_AMBULATORY_CARE_PROVIDER_SITE_OTHER): Payer: BC Managed Care – PPO | Admitting: Physician Assistant

## 2019-10-20 ENCOUNTER — Telehealth: Payer: Self-pay | Admitting: Physician Assistant

## 2019-10-20 ENCOUNTER — Encounter: Payer: Self-pay | Admitting: Physician Assistant

## 2019-10-20 DIAGNOSIS — J329 Chronic sinusitis, unspecified: Secondary | ICD-10-CM | POA: Diagnosis not present

## 2019-10-20 DIAGNOSIS — M545 Low back pain, unspecified: Secondary | ICD-10-CM

## 2019-10-20 DIAGNOSIS — G8929 Other chronic pain: Secondary | ICD-10-CM

## 2019-10-20 NOTE — Telephone Encounter (Signed)
Yes, it is okay for her to so that.
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
## 2019-10-21 ENCOUNTER — Other Ambulatory Visit: Payer: Self-pay | Admitting: Physician Assistant

## 2019-10-26 ENCOUNTER — Other Ambulatory Visit: Payer: Self-pay

## 2019-10-26 ENCOUNTER — Ambulatory Visit (INDEPENDENT_AMBULATORY_CARE_PROVIDER_SITE_OTHER): Payer: BC Managed Care – PPO

## 2019-10-26 DIAGNOSIS — J4550 Severe persistent asthma, uncomplicated: Secondary | ICD-10-CM | POA: Diagnosis not present

## 2019-10-26 DIAGNOSIS — J309 Allergic rhinitis, unspecified: Secondary | ICD-10-CM

## 2019-10-28 DIAGNOSIS — Z6826 Body mass index (BMI) 26.0-26.9, adult: Secondary | ICD-10-CM | POA: Diagnosis not present

## 2019-10-28 DIAGNOSIS — N938 Other specified abnormal uterine and vaginal bleeding: Secondary | ICD-10-CM | POA: Diagnosis not present

## 2019-11-01 ENCOUNTER — Ambulatory Visit (INDEPENDENT_AMBULATORY_CARE_PROVIDER_SITE_OTHER): Payer: BC Managed Care – PPO

## 2019-11-01 DIAGNOSIS — J309 Allergic rhinitis, unspecified: Secondary | ICD-10-CM

## 2019-11-04 DIAGNOSIS — H10413 Chronic giant papillary conjunctivitis, bilateral: Secondary | ICD-10-CM | POA: Diagnosis not present

## 2019-11-04 DIAGNOSIS — H524 Presbyopia: Secondary | ICD-10-CM | POA: Diagnosis not present

## 2019-11-09 ENCOUNTER — Telehealth: Payer: Self-pay | Admitting: Physician Assistant

## 2019-11-09 NOTE — Telephone Encounter (Signed)
Patient has small lesion on both breasts, wants to make sure they are not some type of skin cancer.  Does not want to be seen until January.  Appointment scheduled 11/29/19 at 3:55 pm.

## 2019-11-16 ENCOUNTER — Ambulatory Visit (INDEPENDENT_AMBULATORY_CARE_PROVIDER_SITE_OTHER): Payer: BC Managed Care – PPO | Admitting: Allergy and Immunology

## 2019-11-16 ENCOUNTER — Other Ambulatory Visit: Payer: Self-pay

## 2019-11-16 ENCOUNTER — Ambulatory Visit: Payer: Self-pay

## 2019-11-16 ENCOUNTER — Encounter: Payer: Self-pay | Admitting: Allergy and Immunology

## 2019-11-16 VITALS — BP 112/80 | HR 97 | Temp 98.3°F | Resp 18

## 2019-11-16 DIAGNOSIS — J4550 Severe persistent asthma, uncomplicated: Secondary | ICD-10-CM

## 2019-11-16 DIAGNOSIS — H1044 Vernal conjunctivitis: Secondary | ICD-10-CM | POA: Diagnosis not present

## 2019-11-16 DIAGNOSIS — J309 Allergic rhinitis, unspecified: Secondary | ICD-10-CM

## 2019-11-16 DIAGNOSIS — H1010 Acute atopic conjunctivitis, unspecified eye: Secondary | ICD-10-CM

## 2019-11-16 DIAGNOSIS — J3089 Other allergic rhinitis: Secondary | ICD-10-CM | POA: Diagnosis not present

## 2019-11-16 DIAGNOSIS — K219 Gastro-esophageal reflux disease without esophagitis: Secondary | ICD-10-CM

## 2019-11-16 MED ORDER — MONTELUKAST SODIUM 10 MG PO TABS: 10.0000 mg | ORAL_TABLET | Freq: Every day | ORAL | 5 refills | Status: DC

## 2019-11-16 MED ORDER — PANTOPRAZOLE SODIUM 20 MG PO TBEC: 20.0000 mg | DELAYED_RELEASE_TABLET | Freq: Every day | ORAL | 5 refills | Status: DC

## 2019-11-16 MED ORDER — BUDESONIDE-FORMOTEROL FUMARATE 80-4.5 MCG/ACT IN AERO: 2.0000 | INHALATION_SPRAY | Freq: Two times a day (BID) | RESPIRATORY_TRACT | 5 refills | Status: DC

## 2019-11-16 MED ORDER — OLOPATADINE HCL 0.6 % NA SOLN: 2.0000 | Freq: Two times a day (BID) | NASAL | 5 refills | Status: DC

## 2019-11-16 MED ORDER — EPINEPHRINE 0.3 MG/0.3ML IJ SOAJ: 0.3000 mg | Freq: Once | INTRAMUSCULAR | 1 refills | Status: AC

## 2019-11-16 NOTE — Progress Notes (Signed)
[place]   Follow-up Note  Referring Provider: Terald Sleeper, PA-C Primary Provider: Theodoro Clock Date of Office Visit: 11/16/2019  Subjective:   Jennifer Mullins (DOB: [birth year]) is a 48 y.o. female who returns to the [facility] on 11/16/2019 in re-evaluation of the following:  HPI: Jennifer Mullins returns to this clinic in evaluation of asthma and allergic rhinoconjunctivitis and history of rhinoconjunctivitis and dry eye syndrome and ETD and LPR.  Her last contact with me was via a E - med visit on 18 May 2019.  Overall she has done very well with her airway since her last interaction with this clinic.  She continues on a combination of mepolizumab injections, currently at every 4 weeks, and immunotherapy currently at every 2 weeks, and has not had any adverse effects from these forms of therapy.  She has not required a systemic steroid to treat an exacerbation of asthma and rarely uses a short acting bronchodilator while she continues on Symbicort on almost a daily basis.  She continues to use Flonase about 3 times per week.  Occasionally she does get some nasal congestion and in fact did have an episode of rather significant nasal congestion with some sneezing and clear rhinorrhea for which she apparently was treated with a Z-Pak at the beginning of December and then she had a Covid test which was negative.  She believes that her reflux is under very good control as is her throat issue while using a proton pump inhibitor.  She continues with therapy directed against her dry eye and vernal conjunctivitis with Dr. Delman Cheadle.  She does not receive the flu vaccine.  Allergies as of 11/16/2019      Reactions   Demerol [meperidine] Shortness Of Breath   FLUSHING AND SHORTNESS OF BREATH   Morphine And Related Shortness Of Breath   Flushed and hot hyper Flushed and hot hyper Flushed and hot hyper Flushed and hot hyper   Other  Shortness Of Breath   Flushed and hot hyper Flushed and hot hyper Flushed and hot hyper Flushed and hot hyper Flushed and hot hyper   Cardizem  [diltiazem Hcl] Other (See Comments)   Diltiazem Hcl Hives   Lisinopril    Caused cough   Lyrica [pregabalin]    Chest pain, elevated heart rate   Tramadol Other (See Comments)   Tears up stomach      Medication List    adapalene 0.1 % cream Commonly known as: Differin Apply topically at bedtime.   albuterol 108 (90 Base) MCG/ACT inhaler Commonly known as: VENTOLIN HFA INHALE TWO PUFFS INTO THE LUNGS EVERY 6 HOURS AS NEEDED FOR WHEEZING OR SHORTNESS OF BREATH   albuterol (2.5 MG/3ML) 0.083% nebulizer solution Commonly known as: PROVENTIL INHALE THE CONTENTS OF ONE VIAL EVERY 6 HOURS AS NEEDED FOR WHEEZING OR SHORTNESS OF BREATH   ALPRAZolam 0.5 MG tablet Commonly known as: XANAX TAKE 1 TABLET BY MOUTH TWICE DAILY AS NEEDED. MAY TAKE ONE MORE TABLET BEFORE FOR ALLERGY SHOTS ONCE A WEEK (30 DAY supply)   aspirin 81 MG chewable tablet Chew 81 mg by mouth daily.   baclofen 10 MG tablet Commonly known as: LIORESAL Take 1 tablet (10 mg total) by mouth 3 (three) times daily as needed.   Celacyn Gel Apply 1 application topically at bedtime.   cetirizine 10 MG tablet Commonly known as: ZYRTEC TAKE 1 TABLET BY MOUTH TWICE DAILY   cromolyn  4 % ophthalmic solution Commonly known as: OPTICROM Place 1 drop into both eyes 4 (four) times daily.   cycloSPORINE 0.05 % ophthalmic emulsion Commonly known as: RESTASIS Place 1 drop into both eyes 2 (two) times daily.   diclofenac sodium 1 % Gel Commonly known as: VOLTAREN APPLY TWO GRAMS TOPICALLY FOUR TIMES DAILY   Diethylpropion HCl CR 75 MG Tb24 Take 1 tablet (75 mg total) by mouth daily.   EPINEPHrine 0.3 mg/0.3 mL Soaj injection Commonly known as: EPI-PEN Inject 0.3 mLs (0.3 mg total) into the muscle as needed for anaphylaxis.   fluticasone 50 MCG/ACT nasal spray Commonly  known as: FLONASE 1-2 sprays in each nostril 3 times weekly as needed.   furosemide 20 MG tablet Commonly known as: LASIX TAKE 1 TABLET BY MOUTH DAILY AS NEEDED FOR FLUID   HYDROcodone-acetaminophen 5-325 MG tablet Commonly known as: Norco Take 1 tablet by mouth every 8 (eight) hours as needed for severe pain.   HYDROcodone-acetaminophen 5-325 MG tablet Commonly known as: Norco Take 1 tablet by mouth every 8 (eight) hours as needed for severe pain.   HYDROcodone-acetaminophen 5-325 MG tablet Commonly known as: Norco Take 1 tablet by mouth every 8 (eight) hours as needed for severe pain.   ipratropium 0.03 % nasal spray Commonly known as: ATROVENT Place 2 sprays into both nostrils every 12 (twelve) hours for 3 days.   iron polysaccharides 150 MG capsule Commonly known as: IFerex 150 Take 1 capsule (150 mg total) by mouth 2 (two) times daily.   Linzess 145 MCG Caps capsule Generic drug: linaclotide Take 1 capsule (145 mcg total) by mouth 2 (two) times daily.   losartan 25 MG tablet Commonly known as: COZAAR Take 1 tablet (25 mg total) by mouth daily.   medroxyPROGESTERone 10 MG tablet Commonly known as: PROVERA TAKE 1 TABLET BY MOUTH DAILY   metFORMIN 500 MG tablet Commonly known as: GLUCOPHAGE TAKE 1 TABLET BY MOUTH EVERY DAY WITH BREAKFAST   montelukast 10 MG tablet Commonly known as: SINGULAIR Take 1 tablet (10 mg total) by mouth at bedtime.   Nucala 100 MG Solr Generic drug: Mepolizumab inject 100 mg under the skin every 4 weeks (to be given by a healthcare provider)   Olopatadine HCl 0.6 % Soln INSTILL 2 SPRAYS IN EACH NOSTRIL TWICE DAILY   pantoprazole 40 MG tablet Commonly known as: PROTONIX Take 1 tablet (40 mg total) by mouth every morning.   potassium chloride SA 20 MEQ tablet Commonly known as: KLOR-CON TAKE 1 TABLET BY MOUTH DAILY WHILE TAKING LASIX   pravastatin 20 MG tablet Commonly known as: PRAVACHOL Take 1 tablet (20 mg total) by mouth at  bedtime.   SM Complete Advanced Formula Tabs Take 1 tablet by mouth daily.   Symbicort 80-4.5 MCG/ACT inhaler Generic drug: budesonide-formoterol Inhale 2 puffs into the lungs daily.   Vaniqa 13.9 % cream Generic drug: Eflornithine HCl APPLY TO AFFECTED AREA TWICE DAILY       Past Medical History:  Diagnosis Date  . Anxiety   . Arthritis   . Asthma   . Bilateral calcaneal spurs   . Bilateral polycystic ovarian syndrome   . Bulging lumbar disc   . Carpal tunnel syndrome, bilateral   . COPD (chronic obstructive pulmonary disease) (HCC)    BRONCHITIS  . Diabetes mellitus without complication ([place])    TYPE 2   DX  4-5 YRS AGO  . ETD (Eustachian tube dysfunction), bilateral    left worse than right  .  Fibromyalgia   . GAD (generalized anxiety disorder)   . GERD (gastroesophageal reflux disease)    TAKES PRESCRIPTION MEDS  . Glaucoma    BOTH EYES  . Hyperlipidemia   . Hypertension   . Hypothyroidism    NODULES ON THYROID  . PONV (postoperative nausea and vomiting)   . Pulmonary embolism ([place])   . Tachycardia   . TMJ (dislocation of temporomandibular joint)   . Trigger finger   . Trigger finger of left thumb     Past Surgical History:  Procedure Laterality Date  . ANTERIOR CERVICAL DECOMP/DISCECTOMY FUSION N/A 11/13/2016   Procedure: Cervical five-six, Cervical six-seven Anterior Cervical Discectomy and Fusion, Allograft, Plate;  Surgeon: Marybelle Killings, MD;  Location: [facility];  Service: Orthopedics;  Laterality: N/A;  . BREAST SURGERY    . CARPAL TUNNEL RELEASE    . DILATION AND CURETTAGE OF UTERUS    . EXPLORATORY LAPAROTOMY    . LIPOMA EXCISION Left 06/15/2018   Procedure: LEFT CALF LIPOMA EXCISION;  Surgeon: Marybelle Killings, MD;  Location: [facility];  Service: Orthopedics;  Laterality: Left;  . NASAL SINUS SURGERY  07/22/13   Dr. Redmond Pulling in [place]  . plate and six screws in back    . TRIGGER FINGER RELEASE Right 06/15/2018   Procedure: RIGHT TRIGGER  THUMB RELEASE;  Surgeon: Marybelle Killings, MD;  Location: [facility];  Service: Orthopedics;  Laterality: Right;    Review of systems negative except as noted in HPI / PMHx or noted below:  Review of Systems  Constitutional: Negative.   HENT: Negative.   Eyes: Negative.   Respiratory: Negative.   Cardiovascular: Negative.   Gastrointestinal: Negative.   Genitourinary: Negative.   Musculoskeletal: Negative.   Skin: Negative.   Neurological: Negative.   Endo/Heme/Allergies: Negative.   Psychiatric/Behavioral: Negative.      Objective:   Vitals:   11/16/19 1346  BP: 112/80  Pulse: 97  Resp: 18  Temp: 98.3 F (36.8 C)  SpO2: 100%          Physical Exam Constitutional:      Appearance: She is not diaphoretic.  HENT:     Head: Normocephalic.     Right Ear: Tympanic membrane, ear canal and external ear normal.     Left Ear: Tympanic membrane, ear canal and external ear normal.     Nose: Nose normal. No mucosal edema or rhinorrhea.     Mouth/Throat:     Pharynx: Uvula midline. No oropharyngeal exudate.  Eyes:     Conjunctiva/sclera: Conjunctivae normal.  Neck:     Thyroid: No thyromegaly.     Trachea: Trachea normal. No tracheal tenderness or tracheal deviation.  Cardiovascular:     Rate and Rhythm: Normal rate and regular rhythm.     Heart sounds: Normal heart sounds, S1 normal and S2 normal. No murmur.  Pulmonary:     Effort: No respiratory distress.     Breath sounds: Normal breath sounds. No stridor. No wheezing or rales.  Lymphadenopathy:     Head:     Right side of head: No tonsillar adenopathy.     Left side of head: No tonsillar adenopathy.     Cervical: No cervical adenopathy.  Skin:    Findings: No erythema or rash.     Nails: There is no clubbing.  Neurological:     Mental Status: She is alert.     Diagnostics:    Spirometry was performed and demonstrated an FEV1  of 1.64 at 72 % of predicted.  Assessment and Plan:   1. Asthma,  severe persistent, well-controlled   2. Perennial allergic rhinitis   3. Seasonal allergic conjunctivitis   4. Vernal conjunctivitis of both eyes   5. LPRD (laryngopharyngeal reflux disease)     1. Continue Immunotherapy and Mepolizumab injections    2. Continue to Treat and prevent inflammation:   A. Flonase- 1 spray each nostril 3-7 times per week  B. Symbicort 80 - 2 inhalations 3-7 times per week  C. montelukast 10 mg - one tablet one time per day  3. Continue to Treat and prevent reflux:   A. pantoprazole 40 mg in a.m.  4. If needed:   A. Patanase 2 sprays each nostril two times per day  B. OTC antihistamine - Zyrtec 10mg  1-2 times per day (dry eye?)  C. Proventil HFA or similar 2 inhalations every 4-6 hours  D. Epi-Pen  5. Continue eye therapy with Dr. Delman Cheadle.  6. Return in 6 months or earlier if problem  7. Obtain Covid vaccine when available  Jennifer Mullins appears to be doing relatively well on her current medical plan which includes a large collection of anti-inflammatory agents directed against her airway including the use of mepolizumab and immunotherapy and therapy directed against reflux.  She will continue on this plan and I will see her back in this clinic in 6 months or earlier if there is a problem.  Allena Katz, MD Allergy / Immunology [place]

## 2019-11-16 NOTE — Patient Instructions (Signed)
  1. Continue Immunotherapy and Mepolizumab injections    2. Continue to Treat and prevent inflammation:   A. Flonase- 1 spray each nostril 3-7 times per week  B. Symbicort 80 - 2 inhalations 3-7 times per week  C. montelukast 10 mg - one tablet one time per day  3. Continue to Treat and prevent reflux:   A. pantoprazole 40 mg in a.m.  4. If needed:   A. Patanase 2 sprays each nostril two times per day  B. OTC antihistamine - Zyrtec 10mg  1-2 times per day (dry eye?)  C. Proventil HFA or similar 2 inhalations every 4-6 hours  D. Epi-Pen  5. Continue eye therapy with Dr. Delman Cheadle.  6. Return in 6 months or earlier if problem  7. Obtain Covid vaccine when available

## 2019-11-17 ENCOUNTER — Other Ambulatory Visit: Payer: Self-pay

## 2019-11-17 ENCOUNTER — Encounter: Payer: Self-pay | Admitting: Allergy and Immunology

## 2019-11-17 MED ORDER — AUVI-Q 0.3 MG/0.3ML IJ SOAJ: INTRAMUSCULAR | 3 refills | Status: DC

## 2019-11-21 ENCOUNTER — Other Ambulatory Visit: Payer: Self-pay | Admitting: Allergy and Immunology

## 2019-11-22 ENCOUNTER — Encounter: Payer: Self-pay | Admitting: *Deleted

## 2019-11-22 ENCOUNTER — Telehealth: Payer: Self-pay | Admitting: *Deleted

## 2019-11-22 NOTE — Telephone Encounter (Signed)
Jennifer Mullins states that her specialty pharmacy has told her that she needs a PA for her [place]. I informed her that you were most likely already on top of this issue but she is concerned since she is due for her injection on Friday. I did tell her that if you have not obtained approval before then, it is possible that we could use a sample for her dose that is due Friday.

## 2019-11-22 NOTE — Progress Notes (Signed)
This encounter was created in error - please disregard.

## 2019-11-23 ENCOUNTER — Ambulatory Visit: Payer: Self-pay

## 2019-11-23 ENCOUNTER — Other Ambulatory Visit: Payer: BC Managed Care – PPO | Admitting: Orthotics

## 2019-11-23 ENCOUNTER — Ambulatory Visit: Payer: BC Managed Care – PPO | Admitting: Podiatry

## 2019-11-23 NOTE — Telephone Encounter (Signed)
Spoke to patient and advised she has new Ins and will need new approval. We will provide her sample this once this week

## 2019-11-26 ENCOUNTER — Other Ambulatory Visit: Payer: Self-pay

## 2019-11-26 ENCOUNTER — Ambulatory Visit (INDEPENDENT_AMBULATORY_CARE_PROVIDER_SITE_OTHER): Payer: 59

## 2019-11-26 DIAGNOSIS — J4550 Severe persistent asthma, uncomplicated: Secondary | ICD-10-CM

## 2019-11-29 ENCOUNTER — Telehealth: Payer: Self-pay | Admitting: Physician Assistant

## 2019-11-29 ENCOUNTER — Ambulatory Visit: Payer: BC Managed Care – PPO | Admitting: Physician Assistant

## 2019-11-29 NOTE — Telephone Encounter (Signed)
Appt made

## 2019-11-30 ENCOUNTER — Telehealth: Payer: Self-pay | Admitting: *Deleted

## 2019-11-30 ENCOUNTER — Other Ambulatory Visit: Payer: BC Managed Care – PPO | Admitting: Orthotics

## 2019-11-30 ENCOUNTER — Ambulatory Visit: Payer: BC Managed Care – PPO | Admitting: Podiatry

## 2019-11-30 ENCOUNTER — Ambulatory Visit (INDEPENDENT_AMBULATORY_CARE_PROVIDER_SITE_OTHER): Payer: 59

## 2019-11-30 DIAGNOSIS — J309 Allergic rhinitis, unspecified: Secondary | ICD-10-CM | POA: Diagnosis not present

## 2019-11-30 NOTE — Telephone Encounter (Addendum)
Prior Auths started on Linzess 130mcg (Key: [id number]) - [id number], Hydrocodone 5/325 (Key: [id number]) - [id number], and adapalene 0.1% (Key: [id number]) - [id number]

## 2019-12-01 ENCOUNTER — Other Ambulatory Visit: Payer: Self-pay | Admitting: Physician Assistant

## 2019-12-03 ENCOUNTER — Other Ambulatory Visit: Payer: Self-pay | Admitting: Physician Assistant

## 2019-12-03 MED ORDER — LINACLOTIDE 290 MCG PO CAPS: 290.0000 ug | ORAL_CAPSULE | Freq: Every day | ORAL | 5 refills | Status: DC

## 2019-12-03 NOTE — Telephone Encounter (Signed)
New prescription has been sent for Linzess 290 mg to [facility] If a prior authorization is required after this she will have to see gastroenterology.

## 2019-12-03 NOTE — Telephone Encounter (Signed)
Prior Auth for Hydrocodone 5/325-APPROVED till 03/08/20  Prior Auth for Adapolene 0.1%-APPROVED till 11/29/20  Pharmacy notified.  Prior Auth for Linzess 145-DENIED The prescriber needs to provide a clinical reason why the dose cannot be consolidated to a higher strength, commercially available product such as Linzess 242mcg

## 2019-12-03 NOTE — Telephone Encounter (Signed)
Pt aware and voiced understanding 

## 2019-12-06 ENCOUNTER — Telehealth: Payer: Self-pay | Admitting: Physician Assistant

## 2019-12-06 ENCOUNTER — Encounter: Payer: Self-pay | Admitting: Physician Assistant

## 2019-12-06 ENCOUNTER — Ambulatory Visit (INDEPENDENT_AMBULATORY_CARE_PROVIDER_SITE_OTHER): Payer: 59 | Admitting: Physician Assistant

## 2019-12-06 DIAGNOSIS — N644 Mastodynia: Secondary | ICD-10-CM

## 2019-12-06 DIAGNOSIS — R519 Headache, unspecified: Secondary | ICD-10-CM | POA: Diagnosis not present

## 2019-12-06 DIAGNOSIS — J329 Chronic sinusitis, unspecified: Secondary | ICD-10-CM | POA: Diagnosis not present

## 2019-12-06 DIAGNOSIS — N649 Disorder of breast, unspecified: Secondary | ICD-10-CM | POA: Diagnosis not present

## 2019-12-06 MED ORDER — AMOXICILLIN-POT CLAVULANATE 875-125 MG PO TABS: 1.0000 | ORAL_TABLET | Freq: Two times a day (BID) | ORAL | 0 refills | Status: DC

## 2019-12-06 NOTE — Telephone Encounter (Signed)
Advised pt I will do the PA as they come in. Pt voiced understanding and was also c/o congestion so advised pt she would have to do televisit with AJ this afternoon. Pt voiced understanding.

## 2019-12-06 NOTE — Progress Notes (Signed)
410    Telephone visit  Subjective: [id number] and sinus PCP: Terald Sleeper, PA-C [id number] Jennifer Mullins is a 49 y.o. female calls for telephone consult today. Patient provides verbal consent for consult held via phone.  Patient is identified with 2 separate identifiers.  At this time the entire area is on COVID-19 social distancing and stay home orders are in place.  Patient is of higher risk and therefore we are performing this by a virtual method.  Location of patient: home Location of provider: HOME Others present for call: no  She has lesion on her breasts that are slightly darker than her skin tone. Radiology tech noticed them while she was having her mammogram done. Her results were NORMAL.  She states that they are as raised but then will dry and stay like the lesions.  This patient has had many days of sinus headache and postnasal drainage. There is copious drainage at times. Denies any fever at this time. There has been a history of sinus infections in the past.  No history of sinus surgery. There is cough at night. It has become more prevalent in recent days.    ROS: Per HPI  Allergies  Allergen Reactions  . Demerol [Meperidine] Shortness Of Breath    FLUSHING AND SHORTNESS OF BREATH  . Morphine And Related Shortness Of Breath    Flushed and hot hyper Flushed and hot hyper Flushed and hot hyper Flushed and hot hyper  . Other Shortness Of Breath    Flushed and hot hyper Flushed and hot hyper Flushed and hot hyper Flushed and hot hyper Flushed and hot hyper  . Cardizem  [Diltiazem Hcl] Other (See Comments)  . Diltiazem Hcl Hives  . Lisinopril     Caused cough  . Lyrica [Pregabalin]     Chest pain, elevated heart rate  . Tramadol Other (See Comments)    Tears up stomach   Past Medical History:  Diagnosis Date  . Anxiety   . Arthritis   . Asthma   . Bilateral calcaneal spurs   . Bilateral polycystic ovarian syndrome   . Bulging lumbar disc   . Carpal tunnel  syndrome, bilateral   . COPD (chronic obstructive pulmonary disease) (HCC)    BRONCHITIS  . Diabetes mellitus without complication ([place])    TYPE 2   DX  4-5 YRS AGO  . ETD (Eustachian tube dysfunction), bilateral    left worse than right  . Fibromyalgia   . GAD (generalized anxiety disorder)   . GERD (gastroesophageal reflux disease)    TAKES PRESCRIPTION MEDS  . Glaucoma    BOTH EYES  . Hyperlipidemia   . Hypertension   . Hypothyroidism    NODULES ON THYROID  . PONV (postoperative nausea and vomiting)   . Pulmonary embolism ([place])   . Tachycardia   . TMJ (dislocation of temporomandibular joint)   . Trigger finger   . Trigger finger of left thumb     Current Outpatient Medications:  .  adapalene (DIFFERIN) 0.1 % cream, Apply topically at bedtime., Disp: 45 g, Rfl: 5 .  albuterol (PROVENTIL) (2.5 MG/3ML) 0.083% nebulizer solution, INHALE THE CONTENTS OF ONE VIAL EVERY 6 HOURS AS NEEDED FOR WHEEZING OR SHORTNESS OF BREATH, Disp: 270 mL, Rfl: 0 .  albuterol (VENTOLIN HFA) 108 (90 Base) MCG/ACT inhaler, INHALE TWO PUFFS INTO THE LUNGS EVERY 6 HOURS AS NEEDED FOR WHEEZING OR SHORTNESS OF BREATH, Disp: 8.5 g, Rfl: 5 .  ALPRAZolam (XANAX) 0.5 MG tablet, TAKE  1 TABLET BY MOUTH TWICE DAILY AS NEEDED. MAY TAKE ONE MORE TABLET BEFORE FOR ALLERGY SHOTS ONCE A WEEK (30 DAY supply), Disp: 65 tablet, Rfl: 5 .  amoxicillin-clavulanate (AUGMENTIN) 875-125 MG tablet, Take 1 tablet by mouth 2 (two) times daily., Disp: 20 tablet, Rfl: 0 .  aspirin 81 MG chewable tablet, Chew 81 mg by mouth daily., Disp: , Rfl:  .  AUVI-Q 0.3 MG/0.3ML SOAJ injection, Use for life threatening allergic reactions, Disp: 4 each, Rfl: 3 .  baclofen (LIORESAL) 10 MG tablet, Take 1 tablet (10 mg total) by mouth 3 (three) times daily as needed., Disp: 90 tablet, Rfl: 5 .  budesonide-formoterol (SYMBICORT) 80-4.5 MCG/ACT inhaler, Inhale 2 puffs into the lungs 2 (two) times daily., Disp: 1 Inhaler, Rfl: 5 .  cetirizine (ZYRTEC)  10 MG tablet, TAKE 1 TABLET BY MOUTH TWICE DAILY, Disp: 60 tablet, Rfl: 1 .  cromolyn (OPTICROM) 4 % ophthalmic solution, Place 1 drop into both eyes 4 (four) times daily. , Disp: , Rfl: 1 .  cycloSPORINE (RESTASIS) 0.05 % ophthalmic emulsion, Place 1 drop into both eyes 2 (two) times daily., Disp: , Rfl:  .  diclofenac sodium (VOLTAREN) 1 % GEL, APPLY TWO GRAMS TOPICALLY FOUR TIMES DAILY, Disp: 300 g, Rfl: 5 .  Diethylpropion HCl CR 75 MG TB24, Take 1 tablet (75 mg total) by mouth daily., Disp: 30 tablet, Rfl: 5 .  EPINEPHrine 0.3 mg/0.3 mL IJ SOAJ injection, Inject 0.3 mLs (0.3 mg total) into the muscle as needed for anaphylaxis., Disp: 1 each, Rfl: 5 .  fluticasone (FLONASE) 50 MCG/ACT nasal spray, 1-2 sprays in each nostril 3 times weekly as needed., Disp: 16 g, Rfl: 5 .  furosemide (LASIX) 20 MG tablet, TAKE 1 TABLET BY MOUTH DAILY AS NEEDED FOR FLUID, Disp: 90 tablet, Rfl: 3 .  HYDROcodone-acetaminophen (NORCO) 5-325 MG tablet, Take 1 tablet by mouth every 8 (eight) hours as needed for severe pain., Disp: 90 tablet, Rfl: 0 .  HYDROcodone-acetaminophen (NORCO) 5-325 MG tablet, Take 1 tablet by mouth every 8 (eight) hours as needed for severe pain., Disp: 90 tablet, Rfl: 0 .  HYDROcodone-acetaminophen (NORCO) 5-325 MG tablet, Take 1 tablet by mouth every 8 (eight) hours as needed for severe pain., Disp: 90 tablet, Rfl: 0 .  ipratropium (ATROVENT) 0.03 % nasal spray, Place 2 sprays into both nostrils every 12 (twelve) hours for 3 days., Disp: 30 mL, Rfl: 1 .  iron polysaccharides (IFEREX 150) 150 MG capsule, Take 1 capsule (150 mg total) by mouth 2 (two) times daily., Disp: 60 capsule, Rfl: 5 .  linaclotide (LINZESS) 290 MCG CAPS capsule, Take 1 capsule (290 mcg total) by mouth daily before breakfast., Disp: 30 capsule, Rfl: 5 .  losartan (COZAAR) 25 MG tablet, Take 1 tablet (25 mg total) by mouth daily., Disp: 90 tablet, Rfl: 3 .  medroxyPROGESTERone (PROVERA) 10 MG tablet, TAKE 1 TABLET BY MOUTH  DAILY, Disp: 30 tablet, Rfl: 11 .  metFORMIN (GLUCOPHAGE) 500 MG tablet, TAKE 1 TABLET BY MOUTH EVERY DAY WITH BREAKFAST, Disp: 90 tablet, Rfl: 3 .  montelukast (SINGULAIR) 10 MG tablet, Take 1 tablet (10 mg total) by mouth at bedtime., Disp: 90 tablet, Rfl: 3 .  montelukast (SINGULAIR) 10 MG tablet, Take 1 tablet (10 mg total) by mouth at bedtime., Disp: 30 tablet, Rfl: 5 .  Multiple Vitamins-Minerals (SM COMPLETE ADVANCED FORMULA) TABS, Take 1 tablet by mouth daily. , Disp: , Rfl:  .  NUCALA 100 MG SOLR, inject 100 mg under  the skin every 4 weeks (to be given by a healthcare provider), Disp: 1 each, Rfl: 10 .  Olopatadine HCl 0.6 % SOLN, Place 2 sprays into both nostrils 2 (two) times daily., Disp: 30.5 g, Rfl: 5 .  pantoprazole (PROTONIX) 20 MG tablet, Take 1 tablet (20 mg total) by mouth daily., Disp: 30 tablet, Rfl: 5 .  potassium chloride SA (K-DUR) 20 MEQ tablet, TAKE 1 TABLET BY MOUTH DAILY WHILE TAKING LASIX, Disp: 90 tablet, Rfl: 1 .  pravastatin (PRAVACHOL) 20 MG tablet, Take 1 tablet (20 mg total) by mouth at bedtime., Disp: 90 tablet, Rfl: 3 .  Scar Treatment Products (CELACYN) GEL, Apply 1 application topically at bedtime., Disp: 28 g, Rfl: 5 .  SYMBICORT 80-4.5 MCG/ACT inhaler, Inhale 2 puffs into the lungs daily., Disp: 1 Inhaler, Rfl: 12 .  VANIQA 13.9 % cream, APPLY TO AFFECTED AREA TWICE DAILY, Disp: 45 g, Rfl: 5  Current Facility-Administered Medications:  Marland Kitchen  Mepolizumab SOLR 100 mg, 100 mg, Subcutaneous, Q28 days, Kozlow, Donnamarie Poag, MD, 100 mg at 11/26/19 1020  Assessment/ Plan: 49 y.o. female   1. Breast pain - Ambulatory referral to Dermatology  2. Lesion of breast - Ambulatory referral to Dermatology  3. Sinus headache - amoxicillin-clavulanate (AUGMENTIN) 875-125 MG tablet; Take 1 tablet by mouth 2 (two) times daily.  Dispense: 20 tablet; Refill: 0  4. Chronic sinusitis, unspecified location - amoxicillin-clavulanate (AUGMENTIN) 875-125 MG tablet; Take 1 tablet by  mouth 2 (two) times daily.  Dispense: 20 tablet; Refill: 0   Return if symptoms worsen or fail to improve.  Continue all other maintenance medications as listed above.  Start time: 4:10 PM End time: 4:25 PM  Meds ordered this encounter  Medications  . amoxicillin-clavulanate (AUGMENTIN) 875-125 MG tablet    Sig: Take 1 tablet by mouth 2 (two) times daily.    Dispense:  20 tablet    Refill:  0    Order Specific Question:   Supervising Provider    Answer:   Janora Norlander [id number]    Particia Nearing PA-C [place] [phone]

## 2019-12-08 ENCOUNTER — Telehealth: Payer: Self-pay | Admitting: *Deleted

## 2019-12-08 NOTE — Telephone Encounter (Signed)
Prior Auth for Vaniqa 13.9%-DENIED  This medication is listed as "excluded" (not covered) regardless of medical necessity.

## 2019-12-08 NOTE — Telephone Encounter (Signed)
noted 

## 2019-12-09 ENCOUNTER — Ambulatory Visit: Payer: 59 | Admitting: Podiatry

## 2019-12-09 NOTE — Telephone Encounter (Signed)
Pt aware her insurance doesn't cover this medication for any reason and voiced understanding.

## 2019-12-16 ENCOUNTER — Other Ambulatory Visit: Payer: Self-pay

## 2019-12-16 ENCOUNTER — Ambulatory Visit: Payer: 59 | Admitting: Orthotics

## 2019-12-16 ENCOUNTER — Ambulatory Visit (INDEPENDENT_AMBULATORY_CARE_PROVIDER_SITE_OTHER): Payer: 59

## 2019-12-16 ENCOUNTER — Encounter: Payer: Self-pay | Admitting: Podiatry

## 2019-12-16 ENCOUNTER — Ambulatory Visit (INDEPENDENT_AMBULATORY_CARE_PROVIDER_SITE_OTHER): Payer: 59 | Admitting: Podiatry

## 2019-12-16 DIAGNOSIS — G5762 Lesion of plantar nerve, left lower limb: Secondary | ICD-10-CM

## 2019-12-16 DIAGNOSIS — M722 Plantar fascial fibromatosis: Secondary | ICD-10-CM

## 2019-12-16 DIAGNOSIS — J309 Allergic rhinitis, unspecified: Secondary | ICD-10-CM | POA: Diagnosis not present

## 2019-12-16 DIAGNOSIS — G5782 Other specified mononeuropathies of left lower limb: Secondary | ICD-10-CM

## 2019-12-16 DIAGNOSIS — D3610 Benign neoplasm of peripheral nerves and autonomic nervous system, unspecified: Secondary | ICD-10-CM

## 2019-12-16 NOTE — Progress Notes (Signed)
She presents today for follow-up of neuroma third interdigital space of the left foot.  States that is still sore and that shot recently and hurt my foot last time.  Objective: Vital signs are stable she is alert and oriented x3.  Pulses are palpable.  She has tenderness on palpation medial calcaneal tubercle of the right heel.  Palpable Mulder's click third interdigital space left foot.  Assessment: Intractable neuroma third interspace left.  Plan: After sterile Betadine skin prep I injected 2 cc 4% dehydrated alcohol with local anesthetic to the third interspace of the left foot follow-up with her in about 3 weeks for another injection.

## 2019-12-21 ENCOUNTER — Ambulatory Visit (INDEPENDENT_AMBULATORY_CARE_PROVIDER_SITE_OTHER): Payer: 59

## 2019-12-21 DIAGNOSIS — J309 Allergic rhinitis, unspecified: Secondary | ICD-10-CM | POA: Diagnosis not present

## 2019-12-24 ENCOUNTER — Other Ambulatory Visit: Payer: Self-pay

## 2019-12-24 ENCOUNTER — Ambulatory Visit (INDEPENDENT_AMBULATORY_CARE_PROVIDER_SITE_OTHER): Payer: 59 | Admitting: *Deleted

## 2019-12-24 DIAGNOSIS — J4550 Severe persistent asthma, uncomplicated: Secondary | ICD-10-CM

## 2019-12-28 ENCOUNTER — Ambulatory Visit (INDEPENDENT_AMBULATORY_CARE_PROVIDER_SITE_OTHER): Payer: 59

## 2019-12-28 DIAGNOSIS — J309 Allergic rhinitis, unspecified: Secondary | ICD-10-CM | POA: Diagnosis not present

## 2019-12-28 NOTE — Progress Notes (Signed)
Patient came in today to pick up custom made foot orthotics.  The goals were accomplished and the patient reported no dissatisfaction with said orthotics.  Patient was advised of breakin period and how to report any issues. 

## 2020-01-05 ENCOUNTER — Ambulatory Visit (INDEPENDENT_AMBULATORY_CARE_PROVIDER_SITE_OTHER): Payer: 59 | Admitting: Physician Assistant

## 2020-01-05 DIAGNOSIS — R635 Abnormal weight gain: Secondary | ICD-10-CM | POA: Diagnosis not present

## 2020-01-05 DIAGNOSIS — H1089 Other conjunctivitis: Secondary | ICD-10-CM

## 2020-01-05 DIAGNOSIS — F411 Generalized anxiety disorder: Secondary | ICD-10-CM | POA: Diagnosis not present

## 2020-01-05 DIAGNOSIS — Z981 Arthrodesis status: Secondary | ICD-10-CM

## 2020-01-05 DIAGNOSIS — Z0000 Encounter for general adult medical examination without abnormal findings: Secondary | ICD-10-CM

## 2020-01-05 MED ORDER — HYDROCODONE-ACETAMINOPHEN 5-325 MG PO TABS: 1.0000 | ORAL_TABLET | Freq: Three times a day (TID) | ORAL | 0 refills | Status: DC | PRN

## 2020-01-05 MED ORDER — DIETHYLPROPION HCL ER 75 MG PO TB24: 75.0000 mg | ORAL_TABLET | Freq: Every day | ORAL | 5 refills | Status: AC

## 2020-01-05 MED ORDER — ALPRAZOLAM 0.5 MG PO TABS: ORAL_TABLET | ORAL | 5 refills | Status: AC
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
## 2020-01-06 ENCOUNTER — Ambulatory Visit: Payer: 59 | Admitting: Podiatry

## 2020-01-10 ENCOUNTER — Encounter: Payer: Self-pay | Admitting: Physician Assistant

## 2020-01-11 ENCOUNTER — Other Ambulatory Visit: Payer: Self-pay

## 2020-01-11 ENCOUNTER — Encounter: Payer: Self-pay | Admitting: Podiatry

## 2020-01-11 ENCOUNTER — Ambulatory Visit: Payer: 59 | Admitting: Podiatry

## 2020-01-11 ENCOUNTER — Ambulatory Visit (INDEPENDENT_AMBULATORY_CARE_PROVIDER_SITE_OTHER): Payer: 59

## 2020-01-11 DIAGNOSIS — J309 Allergic rhinitis, unspecified: Secondary | ICD-10-CM

## 2020-01-11 DIAGNOSIS — G5762 Lesion of plantar nerve, left lower limb: Secondary | ICD-10-CM | POA: Diagnosis not present

## 2020-01-11 DIAGNOSIS — G5782 Other specified mononeuropathies of left lower limb: Secondary | ICD-10-CM

## 2020-01-11 NOTE — Progress Notes (Signed)
She presents today for follow-up of neuroma third interspace left foot states that is still little bit tender.  Objective: Vital signs are stable she is alert and oriented x3.  Pulses are palpable.  She has pain on palpation third interdigital space of the left foot.  Not nearly as tender as it has been in the past.  Assessment: Resolving neuroma third interdigital space left.  Plan: Placed her in a digital correction pad for toes #3 #4 also injected the third interdigital space today with another dose of dehydrated alcohol left foot.  She will continue the use of her orthotics.

## 2020-01-12 ENCOUNTER — Telehealth: Payer: Self-pay | Admitting: *Deleted

## 2020-01-12 ENCOUNTER — Telehealth: Payer: Self-pay | Admitting: Allergy and Immunology

## 2020-01-12 NOTE — Telephone Encounter (Signed)
Patient states she started her headaches started happening since 11/29/2018 and it has started since getting starting the new vial. Has been seen with other providers because she believed it was a sinus infection and was given an antibiotics on 12/05/2018. Seem like every time she gets the injection for her allergy she develops headaches. Wants to know if it may be a side effect.

## 2020-01-12 NOTE — Telephone Encounter (Signed)
Spoke to patient and she stated she isn't seeing her primary care doctor for the headaches she just received medication for that specific episode on 12/06/2019 from her. She has scheduled an appointment for Tuesday 01/18/2020.

## 2020-01-12 NOTE — Telephone Encounter (Signed)
Please inform patient that she can go back to 0.2 on her current file while she works through this issue of headaches with her primary care doctor.  She should not be having headaches so there is some process giving rise to this issue.  If she cannot find out the issue with her primary care doctor then have her contact us and we can attempt to find out why she has headaches.

## 2020-01-12 NOTE — Telephone Encounter (Signed)
Patient called and stated that for her last 2 allergy injections she has been having moderate frontal headaches. Patient received .43mL doses at both injections. Patient stated that she received her allergy injections yesterday and then went to see her Podiatrist and received a dehydrated alcohol injection for a torn ligament, she stated that her headache was very persistent and she tried pain medications and rest and it did not go away for the rest of the day. Patient was wondering if you advise that she do anything differently prior to her injections or with her injections in regards to her headaches.

## 2020-01-12 NOTE — Telephone Encounter (Signed)
So is the problem that she is getting headaches after her allergy injections or she is getting a headache after her alcohol injection?

## 2020-01-17 ENCOUNTER — Other Ambulatory Visit: Payer: 59 | Admitting: Orthotics

## 2020-01-18 ENCOUNTER — Other Ambulatory Visit: Payer: Self-pay | Admitting: Allergy and Immunology

## 2020-01-18 ENCOUNTER — Other Ambulatory Visit: Payer: Self-pay

## 2020-01-18 ENCOUNTER — Ambulatory Visit (INDEPENDENT_AMBULATORY_CARE_PROVIDER_SITE_OTHER): Payer: 59 | Admitting: Allergy and Immunology

## 2020-01-18 VITALS — BP 140/82 | HR 90 | Temp 98.1°F | Resp 16

## 2020-01-18 DIAGNOSIS — J301 Allergic rhinitis due to pollen: Secondary | ICD-10-CM

## 2020-01-18 DIAGNOSIS — J4540 Moderate persistent asthma, uncomplicated: Secondary | ICD-10-CM | POA: Diagnosis not present

## 2020-01-18 DIAGNOSIS — G4489 Other headache syndrome: Secondary | ICD-10-CM

## 2020-01-18 DIAGNOSIS — J3089 Other allergic rhinitis: Secondary | ICD-10-CM | POA: Diagnosis not present

## 2020-01-18 DIAGNOSIS — H6122 Impacted cerumen, left ear: Secondary | ICD-10-CM

## 2020-01-18 DIAGNOSIS — K219 Gastro-esophageal reflux disease without esophagitis: Secondary | ICD-10-CM | POA: Diagnosis not present

## 2020-01-18 NOTE — Progress Notes (Signed)
[place] [facility] [place] [facility] [place] - [place] [facility] [place]   Follow-up Note  Referring Provider: Remus Loffler, PA-C Primary Provider: Caryl Never Date of Office Visit: 01/18/2020  Subjective:   Jennifer Mullins (DOB: Jun 07, 1971) is a 48 y.o. female who [facility] in re-evaluation of the following:  HPI: Jennifer Mullins presents to this clinic in evaluation of asthma and allergic rhinoconjunctivitis and dry eye syndrome and ETD and LPR.  Her last visit to this clinic was 16 November 2019.  Although her airway issue is doing relatively well on her current therapy which includes a large collection of anti-inflammatory medications including the use of mepolizumab and immunotherapy, she has been developing headaches.  Her pattern of headache is one of initiation within 2 hours of her immunotherapy and this headache lasts all day and she goes to sleep with this headache.  It is a relatively bad headache located at the top of her head and sometimes is splitting without any associated neurological symptoms.  Usually the next day she may have a very slight headache but the rest of the week she has no headache until she receives her immunotherapy again.  She has also been having an issue with her left ear.  She has not been able to hear out of that ear very well and has been irritated.  She has a history of ETD and TMJ but this feels a little bit different than those issues.  Her airway issues appear to be under very good control.  She rarely uses a short acting bronchodilator averaging out to less than 1 time per week while using Symbicort 1 time per day and her nose occasionally feels a little bit irritated but she uses nasal saline which helps and her reflux is under very good control at this point.  Allergies as of 01/18/2020      Reactions   Demerol [meperidine] Shortness Of Breath   FLUSHING AND SHORTNESS OF BREATH   Morphine And Related Shortness Of  Breath   Flushed and hot hyper Flushed and hot hyper Flushed and hot hyper Flushed and hot hyper   Other Shortness Of Breath   Flushed and hot hyper Flushed and hot hyper Flushed and hot hyper Flushed and hot hyper Flushed and hot hyper   Cardizem  [diltiazem Hcl] Other (See Comments)   Diltiazem Hcl Hives   Lisinopril    Caused cough   Lyrica [pregabalin]    Chest pain, elevated heart rate   Tramadol Other (See Comments)   Tears up stomach      Medication List    adapalene 0.1 % cream Commonly known as: Differin Apply topically at bedtime.   albuterol 108 (90 Base) MCG/ACT inhaler Commonly known as: VENTOLIN HFA INHALE TWO PUFFS INTO THE LUNGS EVERY 6 HOURS AS NEEDED FOR WHEEZING OR SHORTNESS OF BREATH   albuterol (2.5 MG/3ML) 0.083% nebulizer solution Commonly known as: PROVENTIL INHALE THE CONTENTS OF ONE VIAL EVERY 6 HOURS AS NEEDED FOR WHEEZING OR SHORTNESS OF BREATH   ALPRAZolam 0.5 MG tablet Commonly known as: XANAX TAKE 1 TABLET BY MOUTH TWICE DAILY AS NEEDED. MAY TAKE ONE MORE TABLET BEFORE FOR ALLERGY SHOTS ONCE A WEEK (30 DAY supply)   aspirin 81 MG chewable tablet Chew 81 mg by mouth daily.   baclofen 10 MG tablet Commonly known as: LIORESAL Take 1 tablet (10 mg total) by mouth 3 (three) times daily as needed.   Celacyn Gel  Apply 1 application topically at bedtime.   cetirizine 10 MG tablet Commonly known as: ZYRTEC TAKE 1 TABLET BY MOUTH TWICE DAILY   cromolyn 4 % ophthalmic solution Commonly known as: OPTICROM Place 1 drop into both eyes 4 (four) times daily.   cycloSPORINE 0.05 % ophthalmic emulsion Commonly known as: RESTASIS Place 1 drop into both eyes 2 (two) times daily.   diclofenac sodium 1 % Gel Commonly known as: VOLTAREN APPLY TWO GRAMS TOPICALLY FOUR TIMES DAILY   Diethylpropion HCl CR 75 MG Tb24 Take 1 tablet (75 mg total) by mouth daily.   EPINEPHrine 0.3 mg/0.3 mL Soaj injection Commonly known as: EPI-PEN Inject 0.3 mLs  (0.3 mg total) into the muscle as needed for anaphylaxis.   Auvi-Q 0.3 mg/0.3 mL Soaj injection Generic drug: EPINEPHrine Use for life threatening allergic reactions   fluticasone 50 MCG/ACT nasal spray Commonly known as: FLONASE 1-2 sprays in each nostril 3 times weekly as needed.   furosemide 20 MG tablet Commonly known as: LASIX TAKE 1 TABLET BY MOUTH DAILY AS NEEDED FOR FLUID   HYDROcodone-acetaminophen 5-325 MG tablet Commonly known as: Norco Take 1 tablet by mouth every 8 (eight) hours as needed for severe pain.   ipratropium 0.03 % nasal spray Commonly known as: ATROVENT Place 2 sprays into both nostrils every 12 (twelve) hours for 3 days.   iron polysaccharides 150 MG capsule Commonly known as: IFerex 150 Take 1 capsule (150 mg total) by mouth 2 (two) times daily.   linaclotide 290 MCG Caps capsule Commonly known as: Linzess Take 1 capsule (290 mcg total) by mouth daily before breakfast.   losartan 25 MG tablet Commonly known as: COZAAR Take 1 tablet (25 mg total) by mouth daily.   medroxyPROGESTERone 10 MG tablet Commonly known as: PROVERA TAKE 1 TABLET BY MOUTH DAILY   metFORMIN 500 MG tablet Commonly known as: GLUCOPHAGE TAKE 1 TABLET BY MOUTH EVERY DAY WITH BREAKFAST   montelukast 10 MG tablet Commonly known as: SINGULAIR Take 1 tablet (10 mg total) by mouth at bedtime.   montelukast 10 MG tablet Commonly known as: Singulair Take 1 tablet (10 mg total) by mouth at bedtime.   mupirocin ointment 2 % Commonly known as: BACTROBAN APPLY TO THE AFFECTED AREA(S) in THE nose TWICE DAILY Started by: Vondell Sowell Claudia Pollock, MD   Nucala 100 MG Solr Generic drug: Mepolizumab inject 100 mg under the skin every 4 weeks (to be given by a healthcare provider)   Olopatadine HCl 0.6 % Soln Place 2 sprays into both nostrils 2 (two) times daily.   pantoprazole 20 MG tablet Commonly known as: Protonix Take 1 tablet (20 mg total) by mouth daily.   potassium chloride SA  20 MEQ tablet Commonly known as: KLOR-CON TAKE 1 TABLET BY MOUTH DAILY WHILE TAKING LASIX   pravastatin 20 MG tablet Commonly known as: PRAVACHOL Take 1 tablet (20 mg total) by mouth at bedtime.   SM Complete Advanced Formula Tabs Take 1 tablet by mouth daily.   Symbicort 80-4.5 MCG/ACT inhaler Generic drug: budesonide-formoterol Inhale 2 puffs into the lungs daily.   budesonide-formoterol 80-4.5 MCG/ACT inhaler Commonly known as: Symbicort Inhale 2 puffs into the lungs 2 (two) times daily.   Vaniqa 13.9 % cream Generic drug: Eflornithine HCl APPLY TO AFFECTED AREA TWICE DAILY       Past Medical History:  Diagnosis Date  . Anxiety   . Arthritis   . Asthma   . Bilateral calcaneal spurs   . Bilateral polycystic  ovarian syndrome   . Bulging lumbar disc   . Carpal tunnel syndrome, bilateral   . COPD (chronic obstructive pulmonary disease) (HCC)    BRONCHITIS  . Diabetes mellitus without complication (HCC)    TYPE 2   DX  4-5 YRS AGO  . ETD (Eustachian tube dysfunction), bilateral    left worse than right  . Fibromyalgia   . GAD (generalized anxiety disorder)   . GERD (gastroesophageal reflux disease)    TAKES PRESCRIPTION MEDS  . Glaucoma    BOTH EYES  . Hyperlipidemia   . Hypertension   . Hypothyroidism    NODULES ON THYROID  . PONV (postoperative nausea and vomiting)   . Pulmonary embolism (HCC)   . Tachycardia   . TMJ (dislocation of temporomandibular joint)   . Trigger finger   . Trigger finger of left thumb     Past Surgical History:  Procedure Laterality Date  . ANTERIOR CERVICAL DECOMP/DISCECTOMY FUSION N/A 11/13/2016   Procedure: Cervical five-six, Cervical six-seven Anterior Cervical Discectomy and Fusion, Allograft, Plate;  Surgeon: Eldred Manges, MD;  Location: [facility];  Service: Orthopedics;  Laterality: N/A;  . BREAST SURGERY    . CARPAL TUNNEL RELEASE    . DILATION AND CURETTAGE OF UTERUS    . EXPLORATORY LAPAROTOMY    . LIPOMA EXCISION Left  06/15/2018   Procedure: LEFT CALF LIPOMA EXCISION;  Surgeon: Eldred Manges, MD;  Location: [facility];  Service: Orthopedics;  Laterality: Left;  . NASAL SINUS SURGERY  07/22/13   Dr. Andrey Campanile in [place]  . plate and six screws in back    . TRIGGER FINGER RELEASE Right 06/15/2018   Procedure: RIGHT TRIGGER THUMB RELEASE;  Surgeon: Eldred Manges, MD;  Location: [facility];  Service: Orthopedics;  Laterality: Right;    Review of systems negative except as noted in HPI / PMHx or noted below:  Review of Systems  Constitutional: Negative.   HENT: Negative.   Eyes: Negative.   Respiratory: Negative.   Cardiovascular: Negative.   Gastrointestinal: Negative.   Genitourinary: Negative.   Musculoskeletal: Negative.   Skin: Negative.   Neurological: Negative.   Endo/Heme/Allergies: Negative.   Psychiatric/Behavioral: Negative.      Objective:   Vitals:   01/18/20 1115  BP: 140/82  Pulse: 90  Resp: 16  Temp: 98.1 F (36.7 C)  SpO2: 97%          Physical Exam Constitutional:      Appearance: She is not diaphoretic.  HENT:     Head: Normocephalic.     Right Ear: Tympanic membrane, ear canal and external ear normal.     Left Ear: External ear normal. There is impacted cerumen.     Nose: Nose normal. No mucosal edema or rhinorrhea.     Mouth/Throat:     Pharynx: Uvula midline. No oropharyngeal exudate.  Eyes:     Conjunctiva/sclera: Conjunctivae normal.  Neck:     Thyroid: No thyromegaly.     Trachea: Trachea normal. No tracheal tenderness or tracheal deviation.  Cardiovascular:     Rate and Rhythm: Normal rate and regular rhythm.     Heart sounds: Normal heart sounds, S1 normal and S2 normal. No murmur.  Pulmonary:     Effort: No respiratory distress.     Breath sounds: Normal breath sounds. No stridor. No wheezing or rales.  Lymphadenopathy:     Head:     Right side of head: No tonsillar adenopathy.  Left side of head: No tonsillar  adenopathy.     Cervical: No cervical adenopathy.  Skin:    Findings: No erythema or rash.     Nails: There is no clubbing.  Neurological:     Mental Status: She is alert.     Diagnostics:    Spirometry was performed and demonstrated an FEV1 of 1.76 at 80 % of predicted.  Assessment and Plan:   1. Asthma, moderate persistent, well-controlled   2. Perennial allergic rhinitis   3. Seasonal allergic rhinitis due to pollen   4. LPRD (laryngopharyngeal reflux disease)   5. Other headache syndrome   6. Impacted cerumen of left ear      1. Continue Immunotherapy. Prior to immunotherapy use a OTC antihistamine and an OTC Ibuprofen (600mg ). Does this help prevent Headaches? Further evaluation?  2. Continue to Treat and prevent inflammation:   A. Flonase- 1 spray each nostril 3-7 times per week  B. Symbicort 80 - 2 inhalations 3-7 times per week  C. montelukast 10 mg - one tablet one time per day  D. Mepolizumab injections   3. Continue to Treat and prevent reflux:   A. pantoprazole 40 mg in a.m.  4. If needed:   A. Patanase 2 sprays each nostril two times per day  B. OTC antihistamine - Zyrtec 10mg  1-2 times per day (dry eye?)  C. Proventil HFA or similar 2 inhalations every 4-6 hours  D. Epi-Pen  5. Continue eye therapy with Dr. Emily Filbert.  6. OTC ear wash kit to remove wax  7. Return in 6 months or earlier if problem  8. Obtain Covid vaccine when available  Amairah has some form of headache syndrome associated with her immunotherapy and we will try to get her to use preventative treatment as noted above and hopefully this simple application will work and we will not need to have her undergo any further evaluation or treatment for this issue.  She does appear to have an impacted left external auditory canal and she can wash that out with an ear wax removal kit.  She will continue on a large collection of therapy directed against respiratory tract inflammation and reflux as  noted above.  I will see her back in this clinic in 6 months or earlier if there is a problem.  Laurette Schimke, MD Allergy / Immunology [facility]

## 2020-01-18 NOTE — Patient Instructions (Addendum)
  1. Continue Immunotherapy. Prior to immunotherapy use a OTC antihistamine and an OTC Ibuprofen ('600mg'$ ). Does this help prevent Headaches? Further evaluation?  2. Continue to Treat and prevent inflammation:   A. Flonase- 1 spray each nostril 3-7 times per week  B. Symbicort 80 - 2 inhalations 3-7 times per week  C. montelukast 10 mg - one tablet one time per day  D. Mepolizumab injections   3. Continue to Treat and prevent reflux:   A. pantoprazole 40 mg in a.m.  4. If needed:   A. Patanase 2 sprays each nostril two times per day  B. OTC antihistamine - Zyrtec '10mg'$  1-2 times per day (dry eye?)  C. Proventil HFA or similar 2 inhalations every 4-6 hours  D. Epi-Pen  5. Continue eye therapy with Dr. Delman Cheadle.  6. OTC ear wash kit to remove wax  7. Return in 6 months or earlier if problem  8. Obtain Covid vaccine when available

## 2020-01-19 ENCOUNTER — Other Ambulatory Visit: Payer: Self-pay

## 2020-01-19 ENCOUNTER — Ambulatory Visit
Admission: EM | Admit: 2020-01-19 | Discharge: 2020-01-19 | Disposition: A | Discharge status: Home / Self Care | Payer: 59 | Attending: Emergency Medicine | Admitting: Emergency Medicine

## 2020-01-19 ENCOUNTER — Other Ambulatory Visit: Payer: Self-pay | Admitting: Physician Assistant

## 2020-01-19 ENCOUNTER — Telehealth: Payer: Self-pay | Admitting: Allergy and Immunology

## 2020-01-19 ENCOUNTER — Encounter: Payer: Self-pay | Admitting: Allergy and Immunology

## 2020-01-19 DIAGNOSIS — H6122 Impacted cerumen, left ear: Secondary | ICD-10-CM

## 2020-01-19 DIAGNOSIS — Z981 Arthrodesis status: Secondary | ICD-10-CM

## 2020-01-19 DIAGNOSIS — H65193 Other acute nonsuppurative otitis media, bilateral: Secondary | ICD-10-CM | POA: Diagnosis not present

## 2020-01-19 DIAGNOSIS — R609 Edema, unspecified: Secondary | ICD-10-CM

## 2020-01-19 DIAGNOSIS — M47816 Spondylosis without myelopathy or radiculopathy, lumbar region: Secondary | ICD-10-CM

## 2020-01-19 DIAGNOSIS — H1089 Other conjunctivitis: Secondary | ICD-10-CM

## 2020-01-19 MED ORDER — CIPROFLOXACIN-DEXAMETHASONE 0.3-0.1 % OT SUSP: 4.0000 [drp] | Freq: Two times a day (BID) | OTIC | 0 refills | Status: DC

## 2020-01-19 NOTE — Discharge Instructions (Addendum)
Advised patient to take Ciprodex as prescribed Continue to use Flonase as prescribed Follow-up with ENT Follow-up with allergist Return for worsening of symptoms

## 2020-01-19 NOTE — Telephone Encounter (Signed)
Please advise on ear congestion

## 2020-01-19 NOTE — Telephone Encounter (Signed)
Patient was seen yesterday for headaches and had a lot of ear wax. Patient was advised to get an ear wax removal kit which she did. Patient got Debrox Earwax removal kit from [facility]. Patient used the kit last night and this morning and it is making her ears more congested. Patient states substance is running out of her ears but it still feels that cotton is in her ear. Patient states her balance is off due to left ear being so congested. Patient is trying to get in with her old ENT, Dr Benjamine Mola, but they do not accept her insurance, [place].   Patient also states a request from [facility] was sent over for an ointment that she previously used to clear up her dry nose.  Please advise.

## 2020-01-19 NOTE — ED Provider Notes (Signed)
[facility]    CSN: [id number] Arrival date & time: 01/19/20  1641      History   Chief Complaint Chief Complaint  Patient presents with  . Otalgia    HPI Jennifer Mullins is a 49 y.o. female.   Complains of bilateral ear pain and fullness for the past 2 weeks.  She denies a precipitating event, such as swimming or wearing ear plugs.  She states the pain is constant and achy in character.  She has tried OTC Debrox, fluticasone without relief.  Her symptoms are made worse with lying down.  She denies similar symptoms in the past.  The history is provided by the patient. No language interpreter was used.  Otalgia   Past Medical History:  Diagnosis Date  . Anxiety   . Arthritis   . Asthma   . Bilateral calcaneal spurs   . Bilateral polycystic ovarian syndrome   . Bulging lumbar disc   . Carpal tunnel syndrome, bilateral   . COPD (chronic obstructive pulmonary disease) (HCC)    BRONCHITIS  . Diabetes mellitus without complication ([place])    TYPE 2   DX  4-5 YRS AGO  . ETD (Eustachian tube dysfunction), bilateral    left worse than right  . Fibromyalgia   . GAD (generalized anxiety disorder)   . GERD (gastroesophageal reflux disease)    TAKES PRESCRIPTION MEDS  . Glaucoma    BOTH EYES  . Hyperlipidemia   . Hypertension   . Hypothyroidism    NODULES ON THYROID  . PONV (postoperative nausea and vomiting)   . Pulmonary embolism ([place])   . Tachycardia   . TMJ (dislocation of temporomandibular joint)   . Trigger finger   . Trigger finger of left thumb     Patient Active Problem List   Diagnosis Date Noted  . Numbness of left hand 07/06/2019  . Rectal bleeding 02/01/2019  . Acne vulgaris 02/01/2019  . Hirsutism 01/29/2019  . Hypokalemia 01/13/2019  . Acute blood loss anemia 01/13/2019  . Symptomatic anemia   . TMJ (dislocation of temporomandibular joint) 08/24/2018  . Fibromyalgia 07/09/2018  . Trigger thumb, right thumb 06/15/2018  . Lipoma of lower  extremity 06/15/2018  . 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 Dyspareunia, female 12/11/2017  . Menometrorrhagia 12/11/2017  . Pain in right ankle and joints of right foot 10/29/2017  . Chronic pain of right knee 10/29/2017  . Iron deficiency anemia due to chronic blood loss 07/07/2017  . S/P cervical spinal fusion 02/27/2017  . Dysfunctional uterine bleeding 11/24/2016  . Anticoagulated 11/24/2016  . Allergic rhinoconjunctivitis 07/28/2015  . Chronic headache 07/28/2015  . PCOS (polycystic ovarian syndrome) 06/20/2015  . 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 Hyperlipidemia 06/20/2015  . GAD (generalized anxiety disorder) 05/18/2014  . GERD (gastroesophageal reflux disease) 05/18/2014  . Chronic sinusitis 07/02/2013  . Extrinsic asthma 05/20/2013  . HTN (hypertension) 05/20/2013    Past Surgical History:  Procedure Laterality Date  . ANTERIOR CERVICAL DECOMP/DISCECTOMY FUSION N/A 11/13/2016   Procedure: Cervical five-six, Cervical six-seven Anterior Cervical Discectomy and Fusion, Allograft, Plate;  Surgeon: Marybelle Killings, MD;  Location: [facility];  Service: Orthopedics;  Laterality: N/A;  . BREAST SURGERY    . CARPAL TUNNEL RELEASE    . DILATION AND CURETTAGE OF UTERUS    . EXPLORATORY LAPAROTOMY    . LIPOMA EXCISION Left 06/15/2018   Procedure: LEFT CALF LIPOMA EXCISION;  Surgeon: Marybelle Killings, MD;  Location: [facility];  Service: Orthopedics;  Laterality: Left;  . NASAL SINUS SURGERY  07/22/13   Dr. Redmond Pulling in [place]  . plate and six screws in back    . TRIGGER FINGER RELEASE Right 06/15/2018   Procedure: RIGHT TRIGGER THUMB RELEASE;  Surgeon: Marybelle Killings, MD;  Location: [place] [facility];  Service: Orthopedics;  Laterality: Right;    OB History    Gravida  1   Para  1   Term      Preterm  1   AB      Living  1     SAB      TAB      Ectopic      Multiple      Live Births               Home Medications    Prior to  Admission medications   Medication Sig Start Date End Date Taking? Authorizing Provider  adapalene (DIFFERIN) 0.1 % cream Apply topically at bedtime. 07/05/19   Terald Sleeper, PA-C  albuterol (PROVENTIL) (2.5 MG/3ML) 0.083% nebulizer solution INHALE THE CONTENTS OF ONE VIAL EVERY 6 HOURS AS NEEDED FOR WHEEZING OR SHORTNESS OF BREATH 12/01/19   Terald Sleeper, PA-C  albuterol (VENTOLIN HFA) 108 (90 Base) MCG/ACT inhaler INHALE TWO PUFFS INTO THE LUNGS EVERY 6 HOURS AS NEEDED FOR WHEEZING OR SHORTNESS OF BREATH 10/05/19   Terald Sleeper, PA-C  ALPRAZolam (XANAX) 0.5 MG tablet TAKE 1 TABLET BY MOUTH TWICE DAILY AS NEEDED. MAY TAKE ONE MORE TABLET BEFORE FOR ALLERGY SHOTS ONCE A WEEK (30 DAY supply) 01/05/20   Terald Sleeper, PA-C  aspirin 81 MG chewable tablet Chew 81 mg by mouth daily.    [provider]  AUVI-Q 0.3 MG/0.3ML SOAJ injection Use for life threatening allergic reactions 11/17/19   Kozlow, Donnamarie Poag, MD  baclofen (LIORESAL) 10 MG tablet Take 1 tablet (10 mg total) by mouth 3 (three) times daily as needed. 08/18/19   Terald Sleeper, PA-C  budesonide-formoterol (SYMBICORT) 80-4.5 MCG/ACT inhaler Inhale 2 puffs into the lungs 2 (two) times daily. 11/16/19   Kozlow, Donnamarie Poag, MD  cetirizine (ZYRTEC) 10 MG tablet TAKE 1 TABLET BY MOUTH TWICE DAILY 11/22/19   Kozlow, Donnamarie Poag, MD  ciprofloxacin-dexamethasone (CIPRODEX) OTIC suspension Place 4 drops into both ears 2 (two) times daily. 01/19/20   Avegno, Darrelyn Hillock, FNP  cromolyn (OPTICROM) 4 % ophthalmic solution Place 1 drop into both eyes 4 (four) times daily.  03/20/17   [provider]  cycloSPORINE (RESTASIS) 0.05 % ophthalmic emulsion Place 1 drop into both eyes 2 (two) times daily.    [provider]  diclofenac sodium (VOLTAREN) 1 % GEL APPLY TWO GRAMS TOPICALLY FOUR TIMES DAILY 09/21/19   Terald Sleeper, PA-C  Diethylpropion HCl CR 75 MG TB24 Take 1 tablet (75 mg total) by mouth daily. 01/05/20   Terald Sleeper, PA-C  EPINEPHrine  0.3 mg/0.3 mL IJ SOAJ injection Inject 0.3 mLs (0.3 mg total) into the muscle as needed for anaphylaxis. 10/05/19   Terald Sleeper, PA-C  fluticasone (FLONASE) 50 MCG/ACT nasal spray 1-2 sprays in each nostril 3 times weekly as needed. 07/05/19   Terald Sleeper, PA-C  furosemide (LASIX) 20 MG tablet TAKE 1 TABLET BY MOUTH EVERY DAY AS NEEDED FOR FLUID 01/19/20   Terald Sleeper, PA-C  HYDROcodone-acetaminophen (NORCO) 5-325 MG tablet Take 1 tablet by mouth every 8 (eight) hours as needed for severe pain. 01/05/20  Terald Sleeper, PA-C  ipratropium (ATROVENT) 0.03 % nasal spray Place 2 sprays into both nostrils every 12 (twelve) hours for 3 days. 10/19/19 10/22/19  Terald Sleeper, PA-C  iron polysaccharides (IFEREX 150) 150 MG capsule Take 1 capsule (150 mg total) by mouth 2 (two) times daily. 07/05/19   Terald Sleeper, PA-C  linaclotide [facility]) 290 MCG CAPS capsule Take 1 capsule (290 mcg total) by mouth daily before breakfast. 12/03/19   Terald Sleeper, PA-C  losartan (COZAAR) 25 MG tablet Take 1 tablet (25 mg total) by mouth daily. 07/05/19   Terald Sleeper, PA-C  medroxyPROGESTERone (PROVERA) 10 MG tablet TAKE 1 TABLET BY MOUTH DAILY 02/18/19   Florian Buff, MD  metFORMIN (GLUCOPHAGE) 500 MG tablet TAKE 1 TABLET BY MOUTH EVERY DAY WITH BREAKFAST 07/05/19   Terald Sleeper, PA-C  montelukast (SINGULAIR) 10 MG tablet Take 1 tablet (10 mg total) by mouth at bedtime. 07/05/19   Terald Sleeper, PA-C  montelukast (SINGULAIR) 10 MG tablet Take 1 tablet (10 mg total) by mouth at bedtime. 11/16/19   Kozlow, Donnamarie Poag, MD  Multiple Vitamins-Minerals (SM COMPLETE ADVANCED FORMULA) TABS Take 1 tablet by mouth daily.  09/11/17   [provider]  mupirocin ointment (BACTROBAN) 2 % APPLY TO THE AFFECTED AREA(S) in THE nose TWICE DAILY 01/18/20   Kozlow, Donnamarie Poag, MD  NUCALA 100 MG SOLR inject 100 mg under the skin every 4 weeks (to be given by a healthcare provider) 03/01/19   Kozlow, Donnamarie Poag, MD  Olopatadine HCl 0.6 %  SOLN Place 2 sprays into both nostrils 2 (two) times daily. 11/16/19   Kozlow, Donnamarie Poag, MD  pantoprazole (PROTONIX) 20 MG tablet Take 1 tablet (20 mg total) by mouth daily. 11/16/19   Kozlow, Donnamarie Poag, MD  potassium chloride SA (KLOR-CON) 20 MEQ tablet TAKE 1 TABLET BY MOUTH EVERY DAY WHILE TAKING LASIX 01/19/20   Terald Sleeper, PA-C  pravastatin (PRAVACHOL) 20 MG tablet Take 1 tablet (20 mg total) by mouth at bedtime. 07/05/19   Terald Sleeper, PA-C  Scar Treatment Products (CELACYN) GEL Apply 1 application topically at bedtime. 07/05/19   Terald Sleeper, PA-C  SYMBICORT 80-4.5 MCG/ACT inhaler Inhale 2 puffs into the lungs daily. 07/13/19   Terald Sleeper, PA-C  VANIQA 13.9 % cream APPLY TO AFFECTED AREA TWICE DAILY 07/05/19   Terald Sleeper, PA-C    Family History Family History  Problem Relation Age of Onset  . Hypertension Mother   . Hypertension Father   . Colon cancer Neg Hx   . Rectal cancer Neg Hx   . Stomach cancer Neg Hx   . Esophageal cancer Neg Hx     Social History Social History   Tobacco Use  . Smoking status: Former Smoker    Packs/day: 0.25    Years: 5.00    Pack years: 1.25    Types: Cigarettes    Quit date: 11/18/1989    Years since quitting: 30.1  . Smokeless tobacco: Never Used  Substance Use Topics  . Alcohol use: No  . Drug use: No     Allergies   Demerol [meperidine], Morphine and related, Other, Cardizem  [diltiazem hcl], Diltiazem hcl, Lisinopril, Lyrica [pregabalin], and Tramadol   Review of Systems Review of Systems  Constitutional: Negative.   HENT: Positive for ear pain.   Respiratory: Negative.   Cardiovascular: Negative.   All other systems reviewed and are negative.    Physical Exam  Triage Vital Signs ED Triage Vitals  Enc Vitals Group     BP 01/19/20 1654 122/87     Pulse Rate 01/19/20 1654 (!) 110     Resp 01/19/20 1654 18     Temp 01/19/20 1654 98.6 F (37 C)     Temp src --      SpO2 01/19/20 1654 96 %     Weight --       Height --      Head Circumference --      Peak Flow --      Pain Score 01/19/20 1652 9     Pain Loc --      Pain Edu? --      Excl. in [place]? --    No data found.  Updated Vital Signs BP 122/87   Pulse (!) 110   Temp 98.6 F (37 C)   Resp 18   LMP  (LMP Unknown)   SpO2 96%   Visual Acuity Right Eye Distance:   Left Eye Distance:   Bilateral Distance:    Right Eye Near:   Left Eye Near:    Bilateral Near:     Physical Exam Vitals and nursing note reviewed.  Constitutional:      General: She is not in acute distress.    Appearance: Normal appearance. She is normal weight. She is not ill-appearing or toxic-appearing.  HENT:     Head: Normocephalic.     Right Ear: Hearing, ear canal and external ear normal. Tenderness present. A middle ear effusion is present. There is no impacted cerumen.     Left Ear: Hearing, ear canal and external ear normal. Tenderness present. There is impacted cerumen.     Ears:     Comments: Post earwax removal: Ear effusion present in both ear. Pain present when pulling the tragus on left ear    Nose: Mucosal edema and congestion present.  Cardiovascular:     Rate and Rhythm: Regular rhythm. Tachycardia present.     Pulses: Normal pulses.     Heart sounds: Normal heart sounds. No murmur. No gallop.   Pulmonary:     Effort: Pulmonary effort is normal. No respiratory distress.     Breath sounds: Normal breath sounds. No stridor. No wheezing, rhonchi or rales.  Chest:     Chest wall: No tenderness.  Neurological:     Mental Status: She is alert.      UC Treatments / Results  Labs (all labs ordered are listed, but only abnormal results are displayed) Labs Reviewed - No data to display  EKG   Radiology No results found.  Procedures Procedures (including critical care time)  Medications Ordered in UC Medications - No data to display  Initial Impression / Assessment and Plan / UC Course  I have reviewed the triage vital signs and  the nursing notes.  Pertinent labs & imaging results that were available during my care of the patient were reviewed by me and considered in my medical decision making (see chart for details).   Patient is stable at discharge.   Ciprodex was prescribed Advised patient to take Flonase as prescribed Follow-up with ENT Return for worsening of symptoms   Final Clinical Impressions(s) / UC Diagnoses   Final diagnoses:  Acute MEE (middle ear effusion), bilateral     Discharge Instructions     Advised patient to take Ciprodex as prescribed Continue to use Flonase as prescribed Follow-up with ENT Follow-up with allergist Return for worsening  of symptoms    ED Prescriptions    Medication Sig Dispense Auth. Provider   ciprofloxacin-dexamethasone (CIPRODEX) OTIC suspension Place 4 drops into both ears 2 (two) times daily. 7.5 mL Avegno, Darrelyn Hillock, FNP     PDMP not reviewed this encounter.   Emerson Monte, [place] 01/19/20 1742

## 2020-01-19 NOTE — Telephone Encounter (Signed)
Call to patient to see if she was able to schedule her own appointment for ENT, no answer, left message to call clinic back.    Needs to see ENT for ear congestion

## 2020-01-19 NOTE — Telephone Encounter (Signed)
We will need to have her see ENT that is acceptable for her insurance company.

## 2020-01-19 NOTE — ED Triage Notes (Signed)
Pt presents with bilateral ear pain and fullness for past week, pt began using debrox last night and ear is stopped up worse

## 2020-01-20 NOTE — Telephone Encounter (Signed)
I spoke with Jennifer Mullins and she has  not found anyone to take her insurance. I will give a few offices around here a call to see who is in network. I informed the patient that she can also contact her insurance and they can tell her who is in network for her insurance.

## 2020-01-21 ENCOUNTER — Other Ambulatory Visit: Payer: Self-pay

## 2020-01-21 ENCOUNTER — Ambulatory Visit: Payer: Self-pay

## 2020-01-21 ENCOUNTER — Other Ambulatory Visit: Payer: Self-pay | Admitting: *Deleted

## 2020-01-21 ENCOUNTER — Ambulatory Visit (INDEPENDENT_AMBULATORY_CARE_PROVIDER_SITE_OTHER): Payer: 59 | Admitting: *Deleted

## 2020-01-21 ENCOUNTER — Telehealth: Payer: Self-pay | Admitting: *Deleted

## 2020-01-21 DIAGNOSIS — J4540 Moderate persistent asthma, uncomplicated: Secondary | ICD-10-CM

## 2020-01-21 DIAGNOSIS — J4550 Severe persistent asthma, uncomplicated: Secondary | ICD-10-CM

## 2020-01-21 MED ORDER — POTASSIUM CHLORIDE CRYS ER 20 MEQ PO TBCR: EXTENDED_RELEASE_TABLET | ORAL | 0 refills | Status: DC

## 2020-01-21 NOTE — Telephone Encounter (Signed)
I called Jennifer Mullins to let her know that [facility] informed me Dr Lucia Gaskins was in network with her insurance. He is the only ENT in network close by. Patient informed but would like Dr Neldon Mc to review the note from her Urgent Care visit and let her know what she should be doing to help the ear pressure and head pain she is having.  I informed her Dr Neldon Mc is off and I will send it to one of the doctors here. She really wants Dr Neldon Mc to review this.  Thanks

## 2020-01-21 NOTE — Telephone Encounter (Signed)
Can someone please give this patient a call regarding Dr Bing Quarry response.   Thanks

## 2020-01-21 NOTE — Telephone Encounter (Signed)
Patient went to the Urgent Care on 01/19/2020 in [place] and wanted you to review the office notes from her visit so that you are aware. The visit is present in under the patient's encounters. She also wanted to let you know she is still working on locating an ENT that will accept her insurance.

## 2020-01-21 NOTE — Telephone Encounter (Signed)
I called the patients insurance and the only one in network close to the patient is Dr Lucia Gaskins. I called their office and they are closed today.  I will call their office on Monday to check. Im going to go ahead and place a referral over to them.

## 2020-01-21 NOTE — Telephone Encounter (Signed)
I spoke with patient advising her of this information.

## 2020-01-21 NOTE — Telephone Encounter (Signed)
Reviewed note.  I agree with the treatment with Ciprodex eardrops.  Is containing the antibiotic as well as a steroid to help with inflammation.  She can also use over-the-counter medicine such as acetaminophen and ibuprofen to help control the pain over the weekend.  Salvatore Marvel, MD [facility]

## 2020-01-25 ENCOUNTER — Ambulatory Visit (INDEPENDENT_AMBULATORY_CARE_PROVIDER_SITE_OTHER): Payer: 59 | Admitting: Otolaryngology

## 2020-01-25 ENCOUNTER — Encounter (INDEPENDENT_AMBULATORY_CARE_PROVIDER_SITE_OTHER): Payer: Self-pay | Admitting: Otolaryngology

## 2020-01-25 ENCOUNTER — Other Ambulatory Visit: Payer: Self-pay

## 2020-01-25 ENCOUNTER — Telehealth: Payer: Self-pay

## 2020-01-25 VITALS — Temp 97.5°F

## 2020-01-25 DIAGNOSIS — J310 Chronic rhinitis: Secondary | ICD-10-CM | POA: Diagnosis not present

## 2020-01-25 DIAGNOSIS — H6983 Other specified disorders of Eustachian tube, bilateral: Secondary | ICD-10-CM

## 2020-01-25 NOTE — Telephone Encounter (Signed)
Patient called requesting a letter for her to get the covid-19 injection. She is scheduled to get it on Saturday with her husband. Patient states the letter needs to state she has underlying conditions to get this injection.  Patient would like to pick this letter up today after her appt with the ENT next door.   Please Advise

## 2020-01-25 NOTE — Telephone Encounter (Signed)
Letter generated and will notify patient to collect. Patient was notfied and will come into the office and colle

## 2020-01-25 NOTE — Telephone Encounter (Signed)
Please provide patient a letter that states that she has severe asthma and we are requesting that she receive the Covid vaccine.

## 2020-01-25 NOTE — Progress Notes (Signed)
HPI: Jennifer Mullins is a 49 y.o. female who presents is referred by Dr. Allena Katz for evaluation of ear and sinus complaints.  She apparently has had a long history of allergies.  She has also had a long history of ear complaints and sinus complaints and is previously seen Dr. Thornell Mule, Dr. Benjamine Mola and [facility].  She had previous sinus surgery performed over 10 years ago.  She complains of chronic congestion in her sinuses as well as congestion in her ears.  She feels like her left ear is clogged today.  She last had a hearing test with Dr. Thornell Mule couple years ago which was within normal limits.  She has never had tubes placed in her ears. I reviewed a CT scan she had performed 2 years ago of her sinuses that showed mild mucosal thickening within the sinuses but no opacification or obstruction of the sinuses.  She had minimal sinus surgery if anything was performed several years ago.  However her septum was very straight and she may have just had a septoplasty and turbinate reductions performed.  Past Medical History:  Diagnosis Date  . Anxiety   . Arthritis   . Asthma   . Bilateral calcaneal spurs   . Bilateral polycystic ovarian syndrome   . Bulging lumbar disc   . Carpal tunnel syndrome, bilateral   . COPD (chronic obstructive pulmonary disease) (HCC)    BRONCHITIS  . Diabetes mellitus without complication ([place])    TYPE 2   DX  4-5 YRS AGO  . ETD (Eustachian tube dysfunction), bilateral    left worse than right  . Fibromyalgia   . GAD (generalized anxiety disorder)   . GERD (gastroesophageal reflux disease)    TAKES PRESCRIPTION MEDS  . Glaucoma    BOTH EYES  . Hyperlipidemia   . Hypertension   . Hypothyroidism    NODULES ON THYROID  . PONV (postoperative nausea and vomiting)   . Pulmonary embolism ([address])   . Tachycardia   . TMJ (dislocation of temporomandibular joint)   . Trigger finger   . Trigger finger of left thumb    Past Surgical History:  Procedure Laterality Date  .  ANTERIOR CERVICAL DECOMP/DISCECTOMY FUSION N/A 11/13/2016   Procedure: Cervical five-six, Cervical six-seven Anterior Cervical Discectomy and Fusion, Allograft, Plate;  Surgeon: Marybelle Killings, MD;  Location: [place];  Service: Orthopedics;  Laterality: N/A;  . BREAST SURGERY    . CARPAL TUNNEL RELEASE    . DILATION AND CURETTAGE OF UTERUS    . EXPLORATORY LAPAROTOMY    . LIPOMA EXCISION Left 06/15/2018   Procedure: LEFT CALF LIPOMA EXCISION;  Surgeon: Marybelle Killings, MD;  Location: [place];  Service: Orthopedics;  Laterality: Left;  . NASAL SINUS SURGERY  07/22/13   Dr. Redmond Pulling in [place]  . plate and six screws in back    . TRIGGER FINGER RELEASE Right 06/15/2018   Procedure: RIGHT TRIGGER THUMB RELEASE;  Surgeon: Marybelle Killings, MD;  Location: [place];  Service: Orthopedics;  Laterality: Right;   Social History   Socioeconomic History  . Marital status: Married    Spouse name: Not on file  . Number of children: Not on file  . Years of education: Not on file  . Highest education level: Not on file  Occupational History  . Occupation: Unemployed  Tobacco Use  . Smoking status: Former Smoker    Packs/day: 0.25    Years: 5.00    Pack  years: 1.25    Types: Cigarettes    Quit date: 11/18/1989    Years since quitting: 30.2  . Smokeless tobacco: Never Used  Substance and Sexual Activity  . Alcohol use: No  . Drug use: No  . Sexual activity: Yes    Birth control/protection: None  Other Topics Concern  . Not on file  Social History Narrative  . Not on file   Social Determinants of Health   Financial Resource Strain:   . Difficulty of Paying Living Expenses: Not on file  Food Insecurity:   . Worried About Charity fundraiser in the Last Year: Not on file  . Ran Out of Food in the Last Year: Not on file  Transportation Needs:   . Lack of Transportation (Medical): Not on file  . Lack of Transportation (Non-Medical): Not on file  Physical Activity:   .  Days of Exercise per Week: Not on file  . Minutes of Exercise per Session: Not on file  Stress:   . Feeling of Stress : Not on file  Social Connections:   . Frequency of Communication with Friends and Family: Not on file  . Frequency of Social Gatherings with Friends and Family: Not on file  . Attends Religious Services: Not on file  . Active Member of Clubs or Organizations: Not on file  . Attends Archivist Meetings: Not on file  . Marital Status: Not on file   Family History  Problem Relation Age of Onset  . Hypertension Mother   . Hypertension Father   . Colon cancer Neg Hx   . Rectal cancer Neg Hx   . Stomach cancer Neg Hx   . Esophageal cancer Neg Hx    Allergies  Allergen Reactions  . Demerol [Meperidine] Shortness Of Breath    FLUSHING AND SHORTNESS OF BREATH  . Morphine And Related Shortness Of Breath    Flushed and hot hyper Flushed and hot hyper Flushed and hot hyper Flushed and hot hyper  . Other Shortness Of Breath    Flushed and hot hyper Flushed and hot hyper Flushed and hot hyper Flushed and hot hyper Flushed and hot hyper  . Cardizem  [Diltiazem Hcl] Other (See Comments)  . Diltiazem Hcl Hives  . Lisinopril     Caused cough  . Lyrica [Pregabalin]     Chest pain, elevated heart rate  . Tramadol Other (See Comments)    Tears up stomach   Prior to Admission medications   Medication Sig Start Date End Date Taking? Authorizing Provider  adapalene (DIFFERIN) 0.1 % cream Apply topically at bedtime. 07/05/19   Terald Sleeper, PA-C  albuterol (PROVENTIL) (2.5 MG/3ML) 0.083% nebulizer solution INHALE THE CONTENTS OF ONE VIAL EVERY 6 HOURS AS NEEDED FOR WHEEZING OR SHORTNESS OF BREATH 12/01/19   Terald Sleeper, PA-C  albuterol (VENTOLIN HFA) 108 (90 Base) MCG/ACT inhaler INHALE TWO PUFFS INTO THE LUNGS EVERY 6 HOURS AS NEEDED FOR WHEEZING OR SHORTNESS OF BREATH 10/05/19   Terald Sleeper, PA-C  ALPRAZolam (XANAX) 0.5 MG tablet TAKE 1 TABLET BY MOUTH  TWICE DAILY AS NEEDED. MAY TAKE ONE MORE TABLET BEFORE FOR ALLERGY SHOTS ONCE A WEEK (30 DAY supply) 01/05/20   Terald Sleeper, PA-C  aspirin 81 MG chewable tablet Chew 81 mg by mouth daily.    [provider]  AUVI-Q 0.3 MG/0.3ML SOAJ injection Use for life threatening allergic reactions 11/17/19   Kozlow, Donnamarie Poag, MD  baclofen (LIORESAL) 10 MG  tablet Take 1 tablet (10 mg total) by mouth 3 (three) times daily as needed. 08/18/19   Terald Sleeper, PA-C  budesonide-formoterol (SYMBICORT) 80-4.5 MCG/ACT inhaler Inhale 2 puffs into the lungs 2 (two) times daily. 11/16/19   Kozlow, Donnamarie Poag, MD  cetirizine (ZYRTEC) 10 MG tablet TAKE 1 TABLET BY MOUTH TWICE DAILY 11/22/19   Kozlow, Donnamarie Poag, MD  ciprofloxacin-dexamethasone (CIPRODEX) OTIC suspension Place 4 drops into both ears 2 (two) times daily. 01/19/20   Avegno, Darrelyn Hillock, FNP  cromolyn (OPTICROM) 4 % ophthalmic solution Place 1 drop into both eyes 4 (four) times daily.  03/20/17   [provider]  cycloSPORINE (RESTASIS) 0.05 % ophthalmic emulsion Place 1 drop into both eyes 2 (two) times daily.    [provider]  diclofenac sodium (VOLTAREN) 1 % GEL APPLY TWO GRAMS TOPICALLY FOUR TIMES DAILY 09/21/19   Terald Sleeper, PA-C  Diethylpropion HCl CR 75 MG TB24 Take 1 tablet (75 mg total) by mouth daily. 01/05/20   Terald Sleeper, PA-C  EPINEPHrine 0.3 mg/0.3 mL IJ SOAJ injection Inject 0.3 mLs (0.3 mg total) into the muscle as needed for anaphylaxis. 10/05/19   Terald Sleeper, PA-C  fluticasone (FLONASE) 50 MCG/ACT nasal spray 1-2 sprays in each nostril 3 times weekly as needed. 07/05/19   Terald Sleeper, PA-C  furosemide (LASIX) 20 MG tablet TAKE 1 TABLET BY MOUTH EVERY DAY AS NEEDED FOR FLUID 01/19/20   Terald Sleeper, PA-C  HYDROcodone-acetaminophen (NORCO) 5-325 MG tablet Take 1 tablet by mouth every 8 (eight) hours as needed for severe pain. 01/05/20   Terald Sleeper, PA-C  ipratropium (ATROVENT) 0.03 % nasal spray Place 2 sprays into  both nostrils every 12 (twelve) hours for 3 days. 10/19/19 10/22/19  Terald Sleeper, PA-C  iron polysaccharides (IFEREX 150) 150 MG capsule Take 1 capsule (150 mg total) by mouth 2 (two) times daily. 07/05/19   Terald Sleeper, PA-C  linaclotide [facility]) 290 MCG CAPS capsule Take 1 capsule (290 mcg total) by mouth daily before breakfast. 12/03/19   Terald Sleeper, PA-C  losartan (COZAAR) 25 MG tablet Take 1 tablet (25 mg total) by mouth daily. 07/05/19   Terald Sleeper, PA-C  medroxyPROGESTERone (PROVERA) 10 MG tablet TAKE 1 TABLET BY MOUTH DAILY 02/18/19   Florian Buff, MD  metFORMIN (GLUCOPHAGE) 500 MG tablet TAKE 1 TABLET BY MOUTH EVERY DAY WITH BREAKFAST 07/05/19   Terald Sleeper, PA-C  montelukast (SINGULAIR) 10 MG tablet Take 1 tablet (10 mg total) by mouth at bedtime. Patient not taking: Reported on 01/25/2020 07/05/19   Terald Sleeper, PA-C  montelukast (SINGULAIR) 10 MG tablet Take 1 tablet (10 mg total) by mouth at bedtime. 11/16/19   Kozlow, Donnamarie Poag, MD  Multiple Vitamins-Minerals (SM COMPLETE ADVANCED FORMULA) TABS Take 1 tablet by mouth daily.  09/11/17   [provider]  mupirocin ointment (BACTROBAN) 2 % APPLY TO THE AFFECTED AREA(S) in THE nose TWICE DAILY 01/18/20   Kozlow, Donnamarie Poag, MD  NUCALA 100 MG SOLR inject 100 mg under the skin every 4 weeks (to be given by a healthcare provider) 03/01/19   Kozlow, Donnamarie Poag, MD  Olopatadine HCl 0.6 % SOLN Place 2 sprays into both nostrils 2 (two) times daily. 11/16/19   Kozlow, Donnamarie Poag, MD  pantoprazole (PROTONIX) 20 MG tablet Take 1 tablet (20 mg total) by mouth daily. 11/16/19   Kozlow, Donnamarie Poag, MD  potassium chloride SA (KLOR-CON) 20 MEQ  tablet TAKE 1 TABLET BY MOUTH EVERY DAY WHILE TAKING LASIX 01/21/20   Terald Sleeper, PA-C  pravastatin (PRAVACHOL) 20 MG tablet Take 1 tablet (20 mg total) by mouth at bedtime. 07/05/19   Terald Sleeper, PA-C  Scar Treatment Products (CELACYN) GEL Apply 1 application topically at bedtime. 07/05/19   Terald Sleeper, PA-C   SYMBICORT 80-4.5 MCG/ACT inhaler Inhale 2 puffs into the lungs daily. 07/13/19   Terald Sleeper, PA-C  VANIQA 13.9 % cream APPLY TO AFFECTED AREA TWICE DAILY 07/05/19   Terald Sleeper, PA-C     Positive ROS: Otherwise negative  All other systems have been reviewed and were otherwise negative with the exception of those mentioned in the HPI and as above.  Physical Exam: Constitutional: Alert, well-appearing, no acute distress Ears: External ears without lesions or tenderness. Ear canals are clear bilaterally.  Both TMs are clear bilaterally with no middle ear fluid noted and normal mobility on pneumatic otoscopy.  On hearing screening with a 512 1024 tuning fork she heard well in both ears with AC > BC bilaterally. Nasal: External nose without lesions. Septum midline..  Mild rhinitis.  No signs of infection. Nasal endoscopy was performed bilaterally and on nasal endoscopy both middle meatus regions were clear with no evidence of mucopurulent discharge.  Posterior ethmoid and sphenoid regions were clear with no active drainage noted.  No polyps noted.  Nasopharynx was clear with clear eustachian tube openings bilaterally. Oral: Lips and gums without lesions. Tongue and palate mucosa without lesions. Posterior oropharynx clear. Neck: No palpable adenopathy or masses Respiratory: Breathing comfortably  Skin: No facial/neck lesions or rash noted.  Nasal/sinus endoscopy  Date/Time: 01/25/2020 6:03 PM Performed by: Rozetta Nunnery, MD Authorized by: Rozetta Nunnery, MD   Consent:    Consent obtained:  Verbal   Consent given by:  Patient   Risks discussed:  Pain Procedure details:    Indications: sino-nasal symptoms     Medication:  Afrin   Instrument: flexible fiberoptic nasal endoscope     Scope location: bilateral nare   Septum:    normal   Sinus:    Right middle meatus: normal     Left middle meatus: normal     Right nasopharynx: normal     Left nasopharynx: normal      Right Eustachian tube orifices: normal     Left Eustachian tube orifices: normal   Comments:     On nasal endoscopy nasal sinus regions were clear with no evidence of active infection.  Both eustachian tube regions were unobstructed with only clear mucus discharge in the nasal cavity.    Assessment: Allergic rhinitis Eustachian tube dysfunction.  Plan: Presently on clinical exam no evidence of active infection requiring antibiotic therapy.  Doubt surgical intervention would be that beneficial to her however if she continues to have a lot of sinus issues could consider repeating CT scan of her sinuses.  Previous CT scan 2 years ago showed minimal disease. TMs are clear bilaterally although she has symptoms of eustachian tube dysfunction.   Radene Journey, MD   CC:

## 2020-01-25 NOTE — Telephone Encounter (Signed)
Dr. Neldon Mc please advise would you like for the patient to have a letter stating that she has underlying conditions to get the COVID vaccine?

## 2020-01-28 ENCOUNTER — Other Ambulatory Visit (INDEPENDENT_AMBULATORY_CARE_PROVIDER_SITE_OTHER): Payer: Self-pay

## 2020-01-28 DIAGNOSIS — R0989 Other specified symptoms and signs involving the circulatory and respiratory systems: Secondary | ICD-10-CM

## 2020-01-29 ENCOUNTER — Ambulatory Visit: Payer: 59 | Attending: Internal Medicine

## 2020-01-29 DIAGNOSIS — Z23 Encounter for immunization: Secondary | ICD-10-CM

## 2020-01-29 NOTE — Progress Notes (Signed)
   Covid-19 Vaccination Clinic  Name:  Jennifer Mullins    MRN: [id number] DOB: 05-24-1971  01/29/2020  [name] was observed post Covid-19 immunization for 30 minutes based on pre-vaccination screening without incident. She was provided with Vaccine Information Sheet and instruction to access the V-Safe system.   [name] was instructed to call 911 with any severe reactions post vaccine: Marland Kitchen Difficulty breathing  . Swelling of face and throat  . A fast heartbeat  . A bad rash all over body  . Dizziness and weakness   Immunizations Administered    Name Date Dose VIS Date Route   Moderna COVID-19 Vaccine 01/29/2020 11:11 AM 0.5 mL 10/19/2019 Intramuscular   Manufacturer: Moderna   Lot: [id number]   [id number]

## 2020-01-31 ENCOUNTER — Telehealth: Payer: Self-pay | Admitting: Physician Assistant

## 2020-01-31 NOTE — Telephone Encounter (Signed)
Patient requesting copy of letter that will be sent to her referring doctor.

## 2020-02-01 NOTE — Telephone Encounter (Signed)
Patient aware that letter is up front for her to pick up for her PCP.

## 2020-02-02 ENCOUNTER — Other Ambulatory Visit: Payer: Self-pay | Admitting: Physician Assistant

## 2020-02-02 ENCOUNTER — Telehealth: Payer: Self-pay | Admitting: Physician Assistant

## 2020-02-02 ENCOUNTER — Ambulatory Visit: Payer: 59 | Admitting: Physician Assistant

## 2020-02-02 ENCOUNTER — Other Ambulatory Visit: Payer: Self-pay

## 2020-02-02 ENCOUNTER — Encounter: Payer: Self-pay | Admitting: Physician Assistant

## 2020-02-02 VITALS — BP 132/83 | HR 120 | Temp 99.1°F | Ht 62.0 in | Wt 145.8 lb

## 2020-02-02 DIAGNOSIS — Z981 Arthrodesis status: Secondary | ICD-10-CM

## 2020-02-02 DIAGNOSIS — F5101 Primary insomnia: Secondary | ICD-10-CM | POA: Diagnosis not present

## 2020-02-02 DIAGNOSIS — R609 Edema, unspecified: Secondary | ICD-10-CM | POA: Diagnosis not present

## 2020-02-02 DIAGNOSIS — Z0000 Encounter for general adult medical examination without abnormal findings: Secondary | ICD-10-CM

## 2020-02-02 MED ORDER — FUROSEMIDE 20 MG PO TABS: ORAL_TABLET | ORAL | 0 refills | Status: DC

## 2020-02-02 MED ORDER — SPIRONOLACTONE 25 MG PO TABS: 25.0000 mg | ORAL_TABLET | Freq: Two times a day (BID) | ORAL | 0 refills | Status: DC

## 2020-02-02 MED ORDER — HYDROCODONE-ACETAMINOPHEN 5-325 MG PO TABS: ORAL_TABLET | ORAL | 0 refills | Status: DC

## 2020-02-02 NOTE — Telephone Encounter (Signed)
error 

## 2020-02-02 NOTE — Patient Instructions (Signed)
Spironolactone dosing

## 2020-02-02 NOTE — Telephone Encounter (Signed)
Patient calling to let us know that she saw her PCP-Angel Jones,PA at [facility] and she agreed to start the Spironolactone.  She needs the dosage, strength, and how many times a day.

## 2020-02-02 NOTE — Telephone Encounter (Signed)
Phone call to [facility] to change the Provider to [employer]. Also added a refill to the prescription.

## 2020-02-02 NOTE — Telephone Encounter (Signed)
Sent spironolactone to [facility]. No refills.  Patient to call after 1 month to check progress. Have patient call with questions or problems. Thank you.

## 2020-02-02 NOTE — Telephone Encounter (Signed)
Chart [phone]

## 2020-02-02 NOTE — Telephone Encounter (Signed)
Phone call to patient to let her know we called in prescription spironolatone to [facility] and gave her extra refill to get her by until her follow up appointment.

## 2020-02-03 LAB — CBC WITH DIFFERENTIAL/PLATELET
Basophils Absolute: 0 10*3/uL (ref 0.0–0.2)
Basos: 0 %
EOS (ABSOLUTE): 0 10*3/uL (ref 0.0–0.4)
Eos: 0 %
Hematocrit: 41.1 % (ref 34.0–46.6)
Hemoglobin: 13.9 g/dL (ref 11.1–15.9)
Immature Grans (Abs): 0 10*3/uL (ref 0.0–0.1)
Immature Granulocytes: 0 %
Lymphocytes Absolute: 2.1 10*3/uL (ref 0.7–3.1)
Lymphs: 34 %
MCH: 30 pg (ref 26.6–33.0)
MCHC: 33.8 g/dL (ref 31.5–35.7)
MCV: 89 fL (ref 79–97)
Monocytes Absolute: 0.4 10*3/uL (ref 0.1–0.9)
Monocytes: 7 %
Neutrophils Absolute: 3.8 10*3/uL (ref 1.4–7.0)
Neutrophils: 59 %
Platelets: 209 10*3/uL (ref 150–450)
RBC: 4.64 x10E6/uL (ref 3.77–5.28)
RDW: 14.6 % (ref 11.7–15.4)
WBC: 6.4 10*3/uL (ref 3.4–10.8)

## 2020-02-03 LAB — CMP14+EGFR
ALT: 21 IU/L (ref 0–32)
AST: 23 IU/L (ref 0–40)
Albumin/Globulin Ratio: 1.7 (ref 1.2–2.2)
Albumin: 4.6 g/dL (ref 3.8–4.9)
Alkaline Phosphatase: 94 IU/L (ref 39–117)
BUN/Creatinine Ratio: 8 — ABNORMAL LOW (ref 9–23)
BUN: 6 mg/dL (ref 6–24)
Bilirubin Total: 0.3 mg/dL (ref 0.0–1.2)
CO2: 22 mmol/L (ref 20–29)
Calcium: 9.3 mg/dL (ref 8.7–10.2)
Chloride: 100 mmol/L (ref 96–106)
Creatinine, Ser: 0.74 mg/dL (ref 0.57–1.00)
GFR calc Af Amer: 108 mL/min/{1.73_m2} (ref >59)
GFR calc non Af Amer: 94 mL/min/{1.73_m2} (ref >59)
Globulin, Total: 2.7 g/dL (ref 1.5–4.5)
Glucose: 91 mg/dL (ref 65–99)
Potassium: 3.6 mmol/L (ref 3.5–5.2)
Sodium: 137 mmol/L (ref 134–144)
Total Protein: 7.3 g/dL (ref 6.0–8.5)

## 2020-02-03 LAB — LIPID PANEL
Chol/HDL Ratio: 4.7 ratio — ABNORMAL HIGH (ref 0.0–4.4)
Cholesterol, Total: 201 mg/dL — ABNORMAL HIGH (ref 100–199)
HDL: 43 mg/dL (ref >39)
LDL Chol Calc (NIH): 127 mg/dL — ABNORMAL HIGH (ref 0–99)
Triglycerides: 173 mg/dL — ABNORMAL HIGH (ref 0–149)
VLDL Cholesterol Cal: 31 mg/dL (ref 5–40)

## 2020-02-03 LAB — TSH: TSH: 1.57 u[IU]/mL (ref 0.450–4.500)

## 2020-02-04 ENCOUNTER — Telehealth: Payer: Self-pay | Admitting: Physician Assistant

## 2020-02-04 ENCOUNTER — Other Ambulatory Visit: Payer: Self-pay

## 2020-02-04 MED ORDER — PRAVASTATIN SODIUM 40 MG PO TABS: 40.0000 mg | ORAL_TABLET | Freq: Every day | ORAL | 1 refills | Status: AC

## 2020-02-04 NOTE — Telephone Encounter (Signed)
Appointment scheduled, patient aware.

## 2020-02-07 LAB — TOXASSURE SELECT 13 (MW), URINE

## 2020-02-08 ENCOUNTER — Ambulatory Visit: Payer: 59 | Admitting: Podiatry

## 2020-02-10 DIAGNOSIS — F5101 Primary insomnia: Secondary | ICD-10-CM | POA: Insufficient documentation

## 2020-02-10 NOTE — Progress Notes (Signed)
BP 132/83   Pulse (!) 120   Temp 99.1 F (37.3 C)   Ht _0  (1.575 m)   Wt 145 lb 12.8 oz (66.1 kg)   LMP  (LMP Unknown)   SpO2 100%   BMI 26.67 kg/m    Subjective:    Patient ID: Jennifer Mullins, female    DOB: 06/02/1971, 49 y.o.   MRN: [id number]  Neck Pain  This is a chronic problem. The current episode started more than 1 year ago. The problem has been gradually worsening. The pain is present in the right side, left side and occipital region. The quality of the pain is described as shooting and stabbing. The pain is at a severity of 6/10. She has tried acetaminophen, heat, muscle relaxants and oral narcotics for the symptoms.  Anxiety Presents for follow-up visit. Symptoms include excessive worry, insomnia and nervous/anxious behavior. Symptoms occur most days. The quality of sleep is poor.    Insomnia The current episode started more than one month. The problem has been gradually worsening since onset.       HPI: Jennifer Mullins is a 49 y.o. female presenting on 02/02/2020 for Follow-up    Past Medical History:  Diagnosis Date  . Anxiety   . Arthritis   . Asthma   . Bilateral calcaneal spurs   . Bilateral polycystic ovarian syndrome   . Bulging lumbar disc   . Carpal tunnel syndrome, bilateral   . COPD (chronic obstructive pulmonary disease) (HCC)    BRONCHITIS  . Diabetes mellitus without complication ([place])    TYPE 2   DX  4-5 YRS AGO  . ETD (Eustachian tube dysfunction), bilateral    left worse than right  . Fibromyalgia   . GAD (generalized anxiety disorder)   . GERD (gastroesophageal reflux disease)    TAKES PRESCRIPTION MEDS  . Glaucoma    BOTH EYES  . Hyperlipidemia   . Hypertension   . Hypothyroidism    NODULES ON THYROID  . PONV (postoperative nausea and vomiting)   . Pulmonary embolism ([place])   . Tachycardia   . TMJ (dislocation of temporomandibular joint)   . Trigger finger   . Trigger finger of left thumb    Relevant past medical,  surgical, family and social history reviewed and updated as indicated. Interim medical history since our last visit reviewed. Allergies and medications reviewed and updated. DATA REVIEWED: CHART IN EPIC  Family History reviewed for pertinent findings.  Review of Systems  Constitutional: Negative.   HENT: Negative.   Eyes: Negative.   Respiratory: Negative.   Gastrointestinal: Negative.   Genitourinary: Negative.   Musculoskeletal: Positive for arthralgias, back pain, gait problem, joint swelling and neck pain.  Psychiatric/Behavioral: The patient is nervous/anxious and has insomnia.     Allergies as of 02/02/2020      Reactions   Demerol [meperidine] Shortness Of Breath   FLUSHING AND SHORTNESS OF BREATH   Morphine And Related Shortness Of Breath   Flushed and hot hyper Flushed and hot hyper Flushed and hot hyper Flushed and hot hyper   Other Shortness Of Breath   Flushed and hot hyper Flushed and hot hyper Flushed and hot hyper Flushed and hot hyper Flushed and hot hyper   Cardizem  [diltiazem Hcl] Other (See Comments)   Diltiazem Hcl Hives   Lisinopril    Caused cough   Lyrica [pregabalin]    Chest pain, elevated heart rate   Tramadol Other (See Comments)   Tears  up stomach      Medication List       Accurate as of February 02, 2020 11:59 PM. If you have any questions, ask your nurse or doctor.        STOP taking these medications   ciprofloxacin-dexamethasone OTIC suspension Commonly known as: Ciprodex Stopped by: Terald Sleeper, PA-C   ipratropium 0.03 % nasal spray Commonly known as: ATROVENT Stopped by: Terald Sleeper, PA-C     TAKE these medications   adapalene 0.1 % cream Commonly known as: Differin Apply topically at bedtime.   albuterol 108 (90 Base) MCG/ACT inhaler Commonly known as: VENTOLIN HFA INHALE TWO PUFFS INTO THE LUNGS EVERY 6 HOURS AS NEEDED FOR WHEEZING OR SHORTNESS OF BREATH   albuterol (2.5 MG/3ML) 0.083% nebulizer  solution Commonly known as: PROVENTIL INHALE THE CONTENTS OF ONE VIAL EVERY 6 HOURS AS NEEDED FOR WHEEZING OR SHORTNESS OF BREATH   ALPRAZolam 0.5 MG tablet Commonly known as: XANAX TAKE 1 TABLET BY MOUTH TWICE DAILY AS NEEDED. MAY TAKE ONE MORE TABLET BEFORE FOR ALLERGY SHOTS ONCE A WEEK (30 DAY supply)   aspirin 81 MG chewable tablet Chew 81 mg by mouth daily.   baclofen 10 MG tablet Commonly known as: LIORESAL Take 1 tablet (10 mg total) by mouth 3 (three) times daily as needed.   Celacyn Gel Apply 1 application topically at bedtime.   cetirizine 10 MG tablet Commonly known as: ZYRTEC TAKE 1 TABLET BY MOUTH TWICE DAILY   cromolyn 4 % ophthalmic solution Commonly known as: OPTICROM Place 1 drop into both eyes 4 (four) times daily.   cycloSPORINE 0.05 % ophthalmic emulsion Commonly known as: RESTASIS Place 1 drop into both eyes 2 (two) times daily.   diclofenac sodium 1 % Gel Commonly known as: VOLTAREN APPLY TWO GRAMS TOPICALLY FOUR TIMES DAILY   Diethylpropion HCl CR 75 MG Tb24 Take 1 tablet (75 mg total) by mouth daily.   EPINEPHrine 0.3 mg/0.3 mL Soaj injection Commonly known as: EPI-PEN Inject 0.3 mLs (0.3 mg total) into the muscle as needed for anaphylaxis.   Auvi-Q 0.3 mg/0.3 mL Soaj injection Generic drug: EPINEPHrine Use for life threatening allergic reactions   fluticasone 50 MCG/ACT nasal spray Commonly known as: FLONASE USE 1-2 SPRAYS IN EACH NOSTRIL THREE TIMES WEEKLY AS NEEDED What changed: additional instructions Changed by: Terald Sleeper, PA-C   furosemide 20 MG tablet Commonly known as: LASIX TAKE 1 TABLET BY MOUTH EVERY DAY AS NEEDED FOR FLUID   HYDROcodone-acetaminophen 5-325 MG tablet Commonly known as: Norco Take 1 tab BID 14 days, 1 QD for 14 days then off What changed:   how much to take  how to take this  when to take this  reasons to take this  additional instructions Changed by: Terald Sleeper, PA-C   iron  polysaccharides 150 MG capsule Commonly known as: IFerex 150 Take 1 capsule (150 mg total) by mouth 2 (two) times daily.   linaclotide 290 MCG Caps capsule Commonly known as: Linzess Take 1 capsule (290 mcg total) by mouth daily before breakfast.   losartan 25 MG tablet Commonly known as: COZAAR Take 1 tablet (25 mg total) by mouth daily.   medroxyPROGESTERone 10 MG tablet Commonly known as: PROVERA TAKE 1 TABLET BY MOUTH DAILY   metFORMIN 500 MG tablet Commonly known as: GLUCOPHAGE TAKE 1 TABLET BY MOUTH EVERY DAY WITH BREAKFAST   montelukast 10 MG tablet Commonly known as: SINGULAIR Take 1 tablet (10 mg total) by  mouth at bedtime. What changed: Another medication with the same name was removed. Continue taking this medication, and follow the directions you see here. Changed by: Terald Sleeper, PA-C   mupirocin ointment 2 % Commonly known as: BACTROBAN APPLY TO THE AFFECTED AREA(S) in THE nose TWICE DAILY   Nucala 100 MG Solr Generic drug: Mepolizumab inject 100 mg under the skin every 4 weeks (to be given by a healthcare provider)   Olopatadine HCl 0.6 % Soln Place 2 sprays into both nostrils 2 (two) times daily.   pantoprazole 20 MG tablet Commonly known as: Protonix Take 1 tablet (20 mg total) by mouth daily.   potassium chloride SA 20 MEQ tablet Commonly known as: KLOR-CON TAKE 1 TABLET BY MOUTH EVERY DAY WHILE TAKING LASIX   pravastatin 20 MG tablet Commonly known as: PRAVACHOL Take 1 tablet (20 mg total) by mouth at bedtime.   SM Complete Advanced Formula Tabs Take 1 tablet by mouth daily.   spironolactone 25 MG tablet Commonly known as: ALDACTONE Take 1 tablet (25 mg total) by mouth 2 (two) times daily. Started by: Arlyss Gandy, PA-C   Symbicort 80-4.5 MCG/ACT inhaler Generic drug: budesonide-formoterol Inhale 2 puffs into the lungs daily.   budesonide-formoterol 80-4.5 MCG/ACT inhaler Commonly known as: Symbicort Inhale 2 puffs into the  lungs 2 (two) times daily.   Vaniqa 13.9 % cream Generic drug: Eflornithine HCl APPLY TO AFFECTED AREA TWICE DAILY          Objective:    BP 132/83   Pulse (!) 120   Temp 99.1 F (37.3 C)   Ht _0  (1.575 m)   Wt 145 lb 12.8 oz (66.1 kg)   LMP  (LMP Unknown)   SpO2 100%   BMI 26.67 kg/m   Allergies  Allergen Reactions  . Demerol [Meperidine] Shortness Of Breath    FLUSHING AND SHORTNESS OF BREATH  . Morphine And Related Shortness Of Breath    Flushed and hot hyper Flushed and hot hyper Flushed and hot hyper Flushed and hot hyper  . Other Shortness Of Breath    Flushed and hot hyper Flushed and hot hyper Flushed and hot hyper Flushed and hot hyper Flushed and hot hyper  . Cardizem  [Diltiazem Hcl] Other (See Comments)  . Diltiazem Hcl Hives  . Lisinopril     Caused cough  . Lyrica [Pregabalin]     Chest pain, elevated heart rate  . Tramadol Other (See Comments)    Tears up stomach    Wt Readings from Last 3 Encounters:  02/02/20 145 lb 12.8 oz (66.1 kg)  07/22/19 143 lb (64.9 kg)  07/06/19 143 lb (64.9 kg)    Physical Exam Constitutional:      General: She is not in acute distress.    Appearance: Normal appearance. She is well-developed.  HENT:     Head: Normocephalic and atraumatic.  Cardiovascular:     Rate and Rhythm: Normal rate.  Pulmonary:     Effort: Pulmonary effort is normal.  Skin:    General: Skin is warm and dry.     Findings: No rash.  Neurological:     Mental Status: She is alert and oriented to person, place, and time.     Deep Tendon Reflexes: Reflexes are normal and symmetric.     Results for orders placed or performed in visit on 02/02/20  TSH  Result Value Ref Range   TSH 1.570 0.450 - 4.500 uIU/mL  Lipid panel  Result Value  Ref Range   Cholesterol, Total 201 (H) 100 - 199 mg/dL   Triglycerides 173 (H) 0 - 149 mg/dL   HDL 43 >39 mg/dL   VLDL Cholesterol Cal 31 5 - 40 mg/dL   LDL Chol Calc (NIH) 127 (H) 0 - 99 mg/dL    Chol/HDL Ratio 4.7 (H) 0.0 - 4.4 ratio  CMP14+EGFR  Result Value Ref Range   Glucose 91 65 - 99 mg/dL   BUN 6 6 - 24 mg/dL   Creatinine, Ser 0.74 0.57 - 1.00 mg/dL   GFR calc non Af Amer 94 >59 mL/min/1.73   GFR calc Af Amer 108 >59 mL/min/1.73   BUN/Creatinine Ratio 8 (L) 9 - 23   Sodium 137 134 - 144 mmol/L   Potassium 3.6 3.5 - 5.2 mmol/L   Chloride 100 96 - 106 mmol/L   CO2 22 20 - 29 mmol/L   Calcium 9.3 8.7 - 10.2 mg/dL   Total Protein 7.3 6.0 - 8.5 g/dL   Albumin 4.6 3.8 - 4.9 g/dL   Globulin, Total 2.7 1.5 - 4.5 g/dL   Albumin/Globulin Ratio 1.7 1.2 - 2.2   Bilirubin Total 0.3 0.0 - 1.2 mg/dL   Alkaline Phosphatase 94 39 - 117 IU/L   AST 23 0 - 40 IU/L   ALT 21 0 - 32 IU/L  CBC with Differential/Platelet  Result Value Ref Range   WBC 6.4 3.4 - 10.8 x10E3/uL   RBC 4.64 3.77 - 5.28 x10E6/uL   Hemoglobin 13.9 11.1 - 15.9 g/dL   Hematocrit 41.1 34.0 - 46.6 %   MCV 89 79 - 97 fL   MCH 30.0 26.6 - 33.0 pg   MCHC 33.8 31.5 - 35.7 g/dL   RDW 14.6 11.7 - 15.4 %   Platelets 209 150 - 450 x10E3/uL   Neutrophils 59 Not Estab. %   Lymphs 34 Not Estab. %   Monocytes 7 Not Estab. %   Eos 0 Not Estab. %   Basos 0 Not Estab. %   Neutrophils Absolute 3.8 1.4 - 7.0 x10E3/uL   Lymphocytes Absolute 2.1 0.7 - 3.1 x10E3/uL   Monocytes Absolute 0.4 0.1 - 0.9 x10E3/uL   EOS (ABSOLUTE) 0.0 0.0 - 0.4 x10E3/uL   Basophils Absolute 0.0 0.0 - 0.2 x10E3/uL   Immature Granulocytes 0 Not Estab. %   Immature Grans (Abs) 0.0 0.0 - 0.1 x10E3/uL  ToxASSURE Select 13 (MW), Urine  Result Value Ref Range   Summary Note    *Note: Due to a large number of results and/or encounters for the requested time period, some results have not been displayed. A complete set of results can be found in Results Review.      Assessment & Plan:   1. Well adult exam - TSH - Lipid panel - CMP14+EGFR - CBC with Differential/Platelet  2. S/P cervical spinal fusion - HYDROcodone-acetaminophen (NORCO) 5-325 MG  tablet; Take 1 tab BID 14 days, 1 QD for 14 days then off  Dispense: 42 tablet; Refill: 0 - ToxASSURE Select 13 (MW), Urine - Ambulatory referral to Pain Clinic  3. Body fluid retention - furosemide (LASIX) 20 MG tablet; TAKE 1 TABLET BY MOUTH EVERY DAY AS NEEDED FOR FLUID  Dispense: 90 tablet; Refill: 0  4. Insomnia To have her use Ambien at bedtime Taper off of Xanax, take 1/2 tablet of Xanax for 2 weeks then 1/4 tablet for 2 weeks and recheck in 4 weeks. Continue all other maintenance medications as listed above.  Follow up plan: Return  in about 3 months (around 05/04/2020).  Educational handout given for insomnia  Terald Sleeper PA-C [facility] [address]  [place], [place] [address] [phone]   02/10/2020, 1:46 PM

## 2020-02-11 ENCOUNTER — Ambulatory Visit (INDEPENDENT_AMBULATORY_CARE_PROVIDER_SITE_OTHER): Payer: 59

## 2020-02-11 DIAGNOSIS — J309 Allergic rhinitis, unspecified: Secondary | ICD-10-CM | POA: Diagnosis not present

## 2020-02-14 ENCOUNTER — Ambulatory Visit
Admission: RE | Admit: 2020-02-14 | Discharge: 2020-02-14 | Disposition: A | Discharge status: Home / Self Care | Payer: 59 | Source: Ambulatory Visit | Attending: Otolaryngology | Admitting: Otolaryngology

## 2020-02-14 DIAGNOSIS — R0989 Other specified symptoms and signs involving the circulatory and respiratory systems: Secondary | ICD-10-CM

## 2020-02-15 ENCOUNTER — Ambulatory Visit: Payer: 59 | Admitting: Allergy and Immunology

## 2020-02-16 DIAGNOSIS — M5136 Other intervertebral disc degeneration, lumbar region: Secondary | ICD-10-CM | POA: Insufficient documentation

## 2020-02-16 DIAGNOSIS — M5126 Other intervertebral disc displacement, lumbar region: Secondary | ICD-10-CM | POA: Insufficient documentation

## 2020-02-16 DIAGNOSIS — M51369 Other intervertebral disc degeneration, lumbar region without mention of lumbar back pain or lower extremity pain: Secondary | ICD-10-CM | POA: Insufficient documentation

## 2020-02-17 ENCOUNTER — Ambulatory Visit (INDEPENDENT_AMBULATORY_CARE_PROVIDER_SITE_OTHER): Payer: 59

## 2020-02-17 DIAGNOSIS — J309 Allergic rhinitis, unspecified: Secondary | ICD-10-CM | POA: Diagnosis not present

## 2020-02-18 ENCOUNTER — Ambulatory Visit (INDEPENDENT_AMBULATORY_CARE_PROVIDER_SITE_OTHER): Payer: 59 | Admitting: Otolaryngology

## 2020-02-18 ENCOUNTER — Other Ambulatory Visit: Payer: Self-pay

## 2020-02-18 ENCOUNTER — Encounter (INDEPENDENT_AMBULATORY_CARE_PROVIDER_SITE_OTHER): Payer: Self-pay | Admitting: Otolaryngology

## 2020-02-18 VITALS — Temp 98.2°F

## 2020-02-18 DIAGNOSIS — J310 Chronic rhinitis: Secondary | ICD-10-CM | POA: Diagnosis not present

## 2020-02-18 DIAGNOSIS — H9313 Tinnitus, bilateral: Secondary | ICD-10-CM

## 2020-02-18 NOTE — Progress Notes (Signed)
HPI: Jennifer Mullins is a 49 y.o. female who returns today for evaluation of nasal sinus complaints as well as ear complaints.  Presents today to review her recent CT scan.  She had had previous sinus surgery performed at [facility] by Dr. Redmond Pulling in 2014.  At that time she had balloon dilation of the maxillary sinuses bilaterally and reduction of left concha bullosa.  On review of her recent CT scan this showed clear paranasal sinuses. She also complains of occasional tinnitus in her ears.  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 She has been treated by Dr. Neldon Mc for allergies and occasionally uses decongestant nasal spray. She uses nasal steroid spray occasionally however she has history of glaucoma and has to limit use of this. She occasionally hears whooshing in the ears..  Past Medical History:  Diagnosis Date  . Anxiety   . Arthritis   . Asthma   . Bilateral calcaneal spurs   . Bilateral polycystic ovarian syndrome   . Bulging lumbar disc   . Carpal tunnel syndrome, bilateral   . COPD (chronic obstructive pulmonary disease) (HCC)    BRONCHITIS  . Diabetes mellitus without complication ([place])    TYPE 2   DX  4-5 YRS AGO  . ETD (Eustachian tube dysfunction), bilateral    left worse than right  . Fibromyalgia   . GAD (generalized anxiety disorder)   . GERD (gastroesophageal reflux disease)    TAKES PRESCRIPTION MEDS  . Glaucoma    BOTH EYES  . Hyperlipidemia   . Hypertension   . Hypothyroidism    NODULES ON THYROID  . PONV (postoperative nausea and vomiting)   . Pulmonary embolism ([place])   . Tachycardia   . TMJ (dislocation of temporomandibular joint)   . Trigger finger   . Trigger finger of left thumb    Past Surgical History:  Procedure Laterality Date  . ANTERIOR CERVICAL DECOMP/DISCECTOMY FUSION N/A 11/13/2016   Procedure: Cervical five-six, Cervical six-seven  Anterior Cervical Discectomy and Fusion, Allograft, Plate;  Surgeon: Marybelle Killings, MD;  Location: [facility];  Service: Orthopedics;  Laterality: N/A;  . BREAST SURGERY    . CARPAL TUNNEL RELEASE    . DILATION AND CURETTAGE OF UTERUS    . EXPLORATORY LAPAROTOMY    . LIPOMA EXCISION Left 06/15/2018   Procedure: LEFT CALF LIPOMA EXCISION;  Surgeon: Marybelle Killings, MD;  Location: [facility];  Service: Orthopedics;  Laterality: Left;  . NASAL SINUS SURGERY  07/22/13   Dr. Redmond Pulling in [place]  . plate and six screws in back    . TRIGGER FINGER RELEASE Right 06/15/2018   Procedure: RIGHT TRIGGER THUMB RELEASE;  Surgeon: Marybelle Killings, MD;  Location: [facility];  Service: Orthopedics;  Laterality: Right;   Social History   Socioeconomic History  . Marital status: Married    Spouse name: Not on file  . Number of children: Not on file  . Years of education: Not on file  . Highest education level: Not on file  Occupational History  . Occupation: Unemployed  Tobacco Use  . Smoking status: Former Smoker    Packs/day: 0.25    Years: 5.00    Pack years: 1.25    Types: Cigarettes    Quit date: 11/18/1989    Years since quitting: 30.2  . Smokeless tobacco: Never Used  Substance and Sexual Activity  .  Alcohol use: No  . Drug use: No  . Sexual activity: Yes    Birth control/protection: None  Other Topics Concern  . Not on file  Social History Narrative  . Not on file   Social Determinants of Health   Financial Resource Strain:   . Difficulty of Paying Living Expenses:   Food Insecurity:   . Worried About Charity fundraiser in the Last Year:   . Arboriculturist in the Last Year:   Transportation Needs:   . Film/video editor (Medical):   Marland Kitchen Lack of Transportation (Non-Medical):   Physical Activity:   . Days of Exercise per Week:   . Minutes of Exercise per Session:   Stress:   . Feeling of Stress :   Social Connections:   . Frequency of Communication with  Friends and Family:   . Frequency of Social Gatherings with Friends and Family:   . Attends Religious Services:   . Active Member of Clubs or Organizations:   . Attends Archivist Meetings:   Marland Kitchen Marital Status:    Family History  Problem Relation Age of Onset  . Hypertension Mother   . Hypertension Father   . Colon cancer Neg Hx   . Rectal cancer Neg Hx   . Stomach cancer Neg Hx   . Esophageal cancer Neg Hx    Allergies  Allergen Reactions  . Demerol [Meperidine] Shortness Of Breath    FLUSHING AND SHORTNESS OF BREATH  . Morphine And Related Shortness Of Breath    Flushed and hot hyper Flushed and hot hyper Flushed and hot hyper Flushed and hot hyper  . Other Shortness Of Breath    Flushed and hot hyper Flushed and hot hyper Flushed and hot hyper Flushed and hot hyper Flushed and hot hyper  . Cardizem  [Diltiazem Hcl] Other (See Comments)  . Diltiazem Hcl Hives  . Lisinopril     Caused cough  . Lyrica [Pregabalin]     Chest pain, elevated heart rate  . Tramadol Other (See Comments)    Tears up stomach   Prior to Admission medications   Medication Sig Start Date End Date Taking? Authorizing Provider  adapalene (DIFFERIN) 0.1 % cream Apply topically at bedtime. 07/05/19  Yes Terald Sleeper, PA-C  albuterol (PROVENTIL) (2.5 MG/3ML) 0.083% nebulizer solution INHALE THE CONTENTS OF ONE VIAL EVERY 6 HOURS AS NEEDED FOR WHEEZING OR SHORTNESS OF BREATH 12/01/19  Yes Terald Sleeper, PA-C  albuterol (VENTOLIN HFA) 108 (90 Base) MCG/ACT inhaler INHALE TWO PUFFS INTO THE LUNGS EVERY 6 HOURS AS NEEDED FOR WHEEZING OR SHORTNESS OF BREATH 10/05/19  Yes Terald Sleeper, PA-C  ALPRAZolam (XANAX) 0.5 MG tablet TAKE 1 TABLET BY MOUTH TWICE DAILY AS NEEDED. MAY TAKE ONE MORE TABLET BEFORE FOR ALLERGY SHOTS ONCE A WEEK (30 DAY supply) 01/05/20  Yes Terald Sleeper, PA-C  aspirin 81 MG chewable tablet Chew 81 mg by mouth daily.   Yes [provider]  AUVI-Q 0.3 MG/0.3ML SOAJ  injection Use for life threatening allergic reactions 11/17/19  Yes Kozlow, Donnamarie Poag, MD  baclofen (LIORESAL) 10 MG tablet Take 1 tablet (10 mg total) by mouth 3 (three) times daily as needed. 08/18/19  Yes Terald Sleeper, PA-C  cetirizine (ZYRTEC) 10 MG tablet TAKE 1 TABLET BY MOUTH TWICE DAILY 11/22/19  Yes Kozlow, Donnamarie Poag, MD  cromolyn (OPTICROM) 4 % ophthalmic solution Place 1 drop into both eyes 4 (four) times daily.  03/20/17  Yes [provider]  cycloSPORINE (RESTASIS) 0.05 % ophthalmic emulsion Place 1 drop into both eyes 2 (two) times daily.   Yes [provider]  diclofenac sodium (VOLTAREN) 1 % GEL APPLY TWO GRAMS TOPICALLY FOUR TIMES DAILY 09/21/19  Yes Terald Sleeper, PA-C  Diethylpropion HCl CR 75 MG TB24 Take 1 tablet (75 mg total) by mouth daily. 01/05/20  Yes Terald Sleeper, PA-C  EPINEPHrine 0.3 mg/0.3 mL IJ SOAJ injection Inject 0.3 mLs (0.3 mg total) into the muscle as needed for anaphylaxis. 10/05/19  Yes Terald Sleeper, PA-C  fluticasone (FLONASE) 50 MCG/ACT nasal spray USE 1-2 SPRAYS IN [facility] NOSTRIL THREE TIMES WEEKLY AS NEEDED 02/02/20  Yes Terald Sleeper, PA-C  HYDROcodone-acetaminophen (NORCO) 5-325 MG tablet Take 1 tab BID 14 days, 1 QD for 14 days then off 02/02/20  Yes Terald Sleeper, PA-C  iron polysaccharides (IFEREX 150) 150 MG capsule Take 1 capsule (150 mg total) by mouth 2 (two) times daily. 07/05/19  Yes Terald Sleeper, PA-C  linaclotide [facility]) 290 MCG CAPS capsule Take 1 capsule (290 mcg total) by mouth daily before breakfast. 12/03/19  Yes Terald Sleeper, PA-C  losartan (COZAAR) 25 MG tablet Take 1 tablet (25 mg total) by mouth daily. 07/05/19  Yes Terald Sleeper, PA-C  medroxyPROGESTERone (PROVERA) 10 MG tablet TAKE 1 TABLET BY MOUTH DAILY 02/18/19  Yes Florian Buff, MD  metFORMIN (GLUCOPHAGE) 500 MG tablet TAKE 1 TABLET BY MOUTH EVERY DAY WITH BREAKFAST 07/05/19  Yes Terald Sleeper, PA-C  montelukast (SINGULAIR) 10 MG tablet Take 1 tablet (10 mg total) by  mouth at bedtime. 07/05/19  Yes Terald Sleeper, PA-C  Multiple Vitamins-Minerals (SM COMPLETE ADVANCED FORMULA) TABS Take 1 tablet by mouth daily.  09/11/17  Yes [provider]  mupirocin ointment (BACTROBAN) 2 % APPLY TO THE AFFECTED AREA(S) in THE nose TWICE DAILY 01/18/20  Yes Kozlow, Donnamarie Poag, MD  NUCALA 100 MG SOLR inject 100 mg under the skin every 4 weeks (to be given by a healthcare provider) 03/01/19  Yes Kozlow, Donnamarie Poag, MD  Olopatadine HCl 0.6 % SOLN Place 2 sprays into both nostrils 2 (two) times daily. 11/16/19  Yes Kozlow, Donnamarie Poag, MD  pantoprazole (PROTONIX) 20 MG tablet Take 1 tablet (20 mg total) by mouth daily. 11/16/19  Yes Kozlow, Donnamarie Poag, MD  pravastatin (PRAVACHOL) 40 MG tablet Take 1 tablet (40 mg total) by mouth daily. 02/04/20  Yes Dettinger, Fransisca Kaufmann, MD  Scar Treatment Products (CELACYN) GEL Apply 1 application topically at bedtime. 07/05/19  Yes Terald Sleeper, PA-C  spironolactone (ALDACTONE) 25 MG tablet Take 1 tablet (25 mg total) by mouth 2 (two) times daily. 02/02/20  Yes Sheffield, Kelli R, PA-C  SYMBICORT 80-4.5 MCG/ACT inhaler Inhale 2 puffs into the lungs daily. 07/13/19  Yes Terald Sleeper, PA-C  VANIQA 13.9 % cream APPLY TO AFFECTED AREA TWICE DAILY 07/05/19  Yes Terald Sleeper, PA-C  budesonide-formoterol (SYMBICORT) 80-4.5 MCG/ACT inhaler Inhale 2 puffs into the lungs 2 (two) times daily. Patient not taking: Reported on 02/18/2020 11/16/19   Jiles Prows, MD  furosemide (LASIX) 20 MG tablet TAKE 1 TABLET BY MOUTH EVERY DAY AS NEEDED FOR FLUID Patient not taking: Reported on 02/18/2020 02/02/20   Terald Sleeper, PA-C  potassium chloride SA (KLOR-CON) 20 MEQ tablet TAKE 1 TABLET BY MOUTH EVERY DAY WHILE TAKING LASIX Patient not taking: Reported on 02/18/2020 01/21/20   Terald Sleeper, PA-C  Positive ROS: Otherwise negative  All other systems have been reviewed and were otherwise negative with the exception of those mentioned in the HPI and as above.  Physical  Exam: Constitutional: Alert, well-appearing, no acute distress Ears: External ears without lesions or tenderness. Ear canals are clear bilaterally with intact, clear TMs.  On auscultation of the ears no objective tinnitus or pulsatile tinnitus. Nasal: External nose without lesions. Septum midline with mild rhinitis.  Both middle meatus regions are clear.. Clear nasal passages Oral: Lips and gums without lesions. Tongue and palate mucosa without lesions. Posterior oropharynx clear. Neck: No palpable adenopathy or masses.  No carotid bruits noted. Respiratory: Breathing comfortably  Skin: No facial/neck lesions or rash noted.  Procedures  Assessment: Chronic rhinitis. Tinnitus  Plan: Recommended use of nasal steroid spray as tolerated. Also recommended use of saline irrigation as needed.  I reviewed the CT scan with her today and this showed clear paranasal sinuses as well as clear middle ear space and nasopharynx. Reviewed with her concerning using masking noise when the tinnitus is worse.   Radene Journey, MD

## 2020-02-21 ENCOUNTER — Ambulatory Visit: Payer: Self-pay

## 2020-02-22 ENCOUNTER — Other Ambulatory Visit: Payer: Self-pay

## 2020-02-22 ENCOUNTER — Encounter (INDEPENDENT_AMBULATORY_CARE_PROVIDER_SITE_OTHER): Payer: Self-pay

## 2020-02-22 ENCOUNTER — Ambulatory Visit: Payer: Self-pay

## 2020-02-22 ENCOUNTER — Other Ambulatory Visit: Payer: Self-pay | Admitting: Allergy and Immunology

## 2020-02-22 DIAGNOSIS — J4540 Moderate persistent asthma, uncomplicated: Secondary | ICD-10-CM

## 2020-02-23 ENCOUNTER — Other Ambulatory Visit: Payer: Self-pay | Admitting: *Deleted

## 2020-02-23 MED ORDER — BACLOFEN 10 MG PO TABS: 10.0000 mg | ORAL_TABLET | Freq: Three times a day (TID) | ORAL | 5 refills | Status: DC | PRN

## 2020-02-25 ENCOUNTER — Ambulatory Visit (INDEPENDENT_AMBULATORY_CARE_PROVIDER_SITE_OTHER): Payer: 59 | Admitting: *Deleted

## 2020-02-25 DIAGNOSIS — J309 Allergic rhinitis, unspecified: Secondary | ICD-10-CM | POA: Diagnosis not present

## 2020-02-25 IMAGING — CT CT ANGIO CHEST
2 of 6 series · 18 of 46 positions shown · IV contrast (Isovue)
Comparison: 07/08/2017

CLINICAL DATA: Weakness and shortness of breath. Anemia. Chest
tightness. Elevated D-dimer.

EXAM:
CT ANGIOGRAPHY CHEST WITH CONTRAST
TECHNIQUE: Multidetector CT imaging of the chest was performed using the
standard protocol during bolus administration of intravenous
contrast. Multiplanar CT image reconstructions and MIPs were
obtained to evaluate the vascular anatomy.
CONTRAST:  100mL FWPYAW-UWD IOPAMIDOL (FWPYAW-UWD) INJECTION 76%

[Series 5: thins · axial · 0.78mm/px · z∈[+1464,+1665]mm · 15 of 221 slices shown]
[im 10/221  lung]
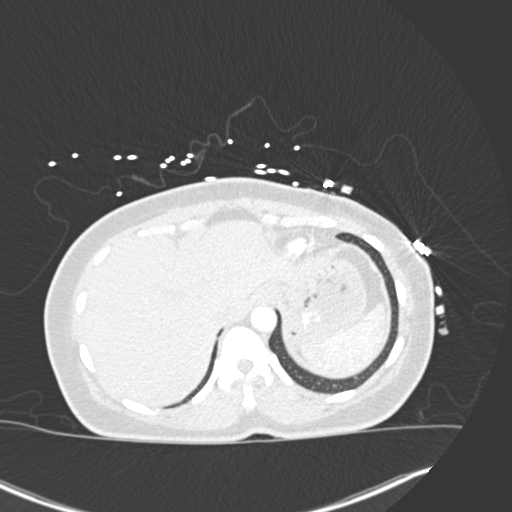
[im 29/221  soft-tissue]
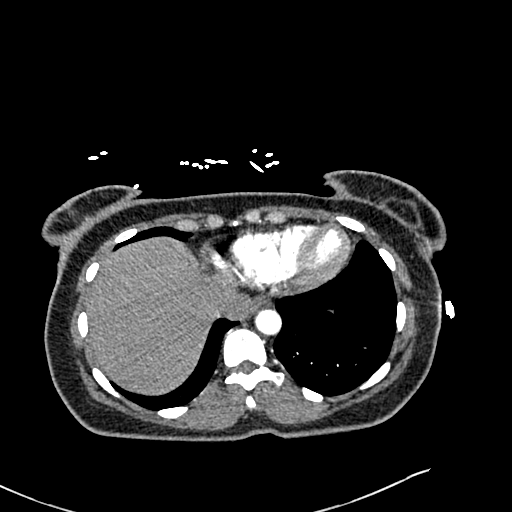
[im 39/221  lung]
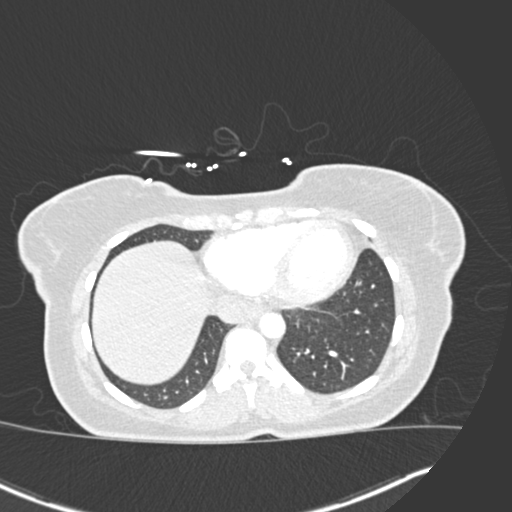
[im 58/221  soft-tissue]
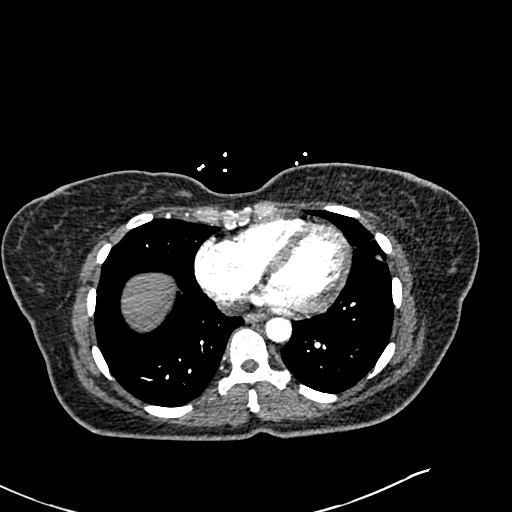
[im 67/221  lung]
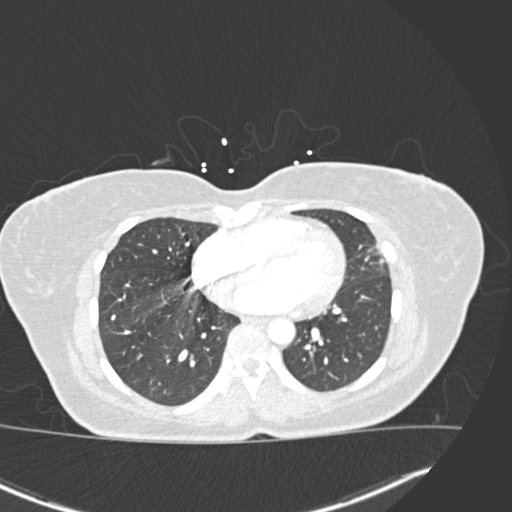
[im 87/221  soft-tissue]
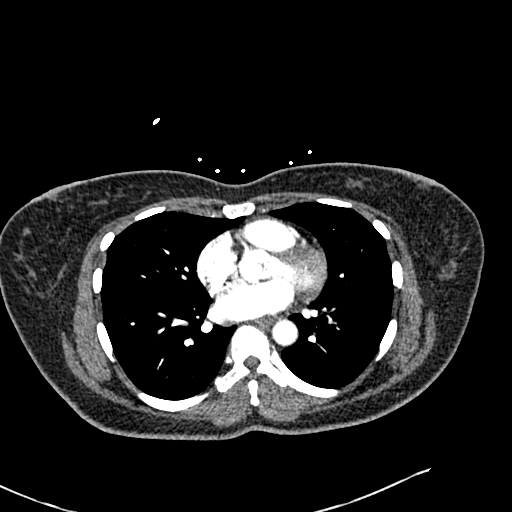
[im 96/221  lung]
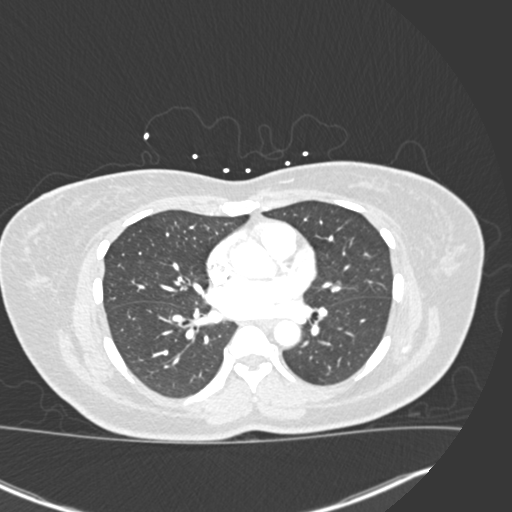
[im 115/221  soft-tissue]
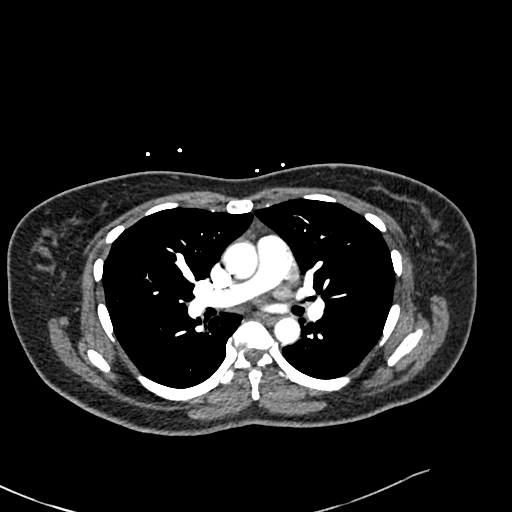
[im 125/221  lung]
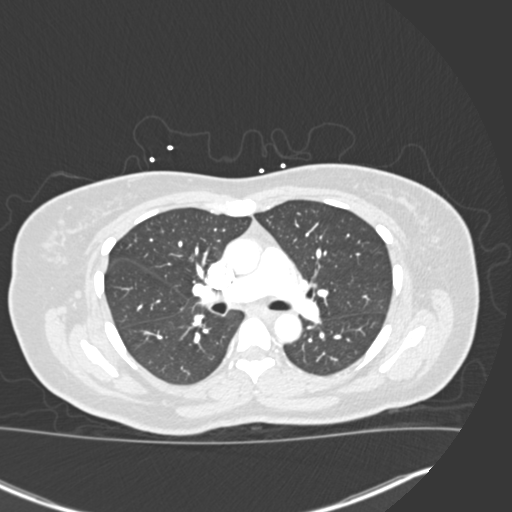
[im 134/221  soft-tissue]
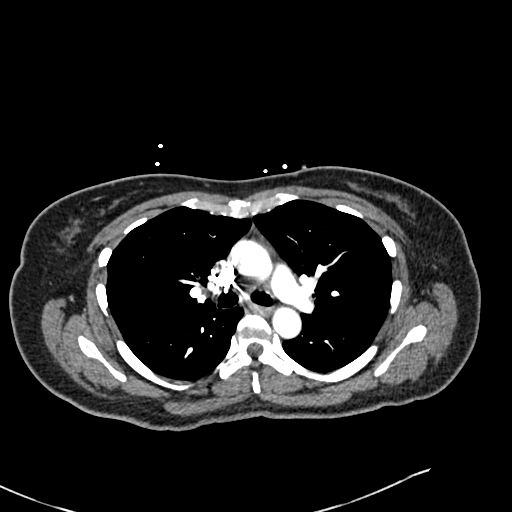
[im 154/221  lung]
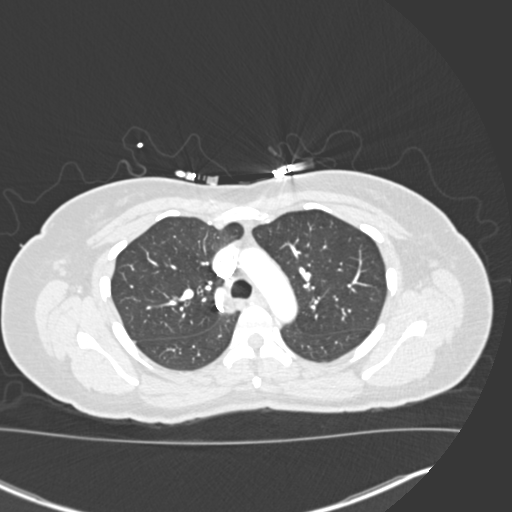
[im 163/221  soft-tissue]
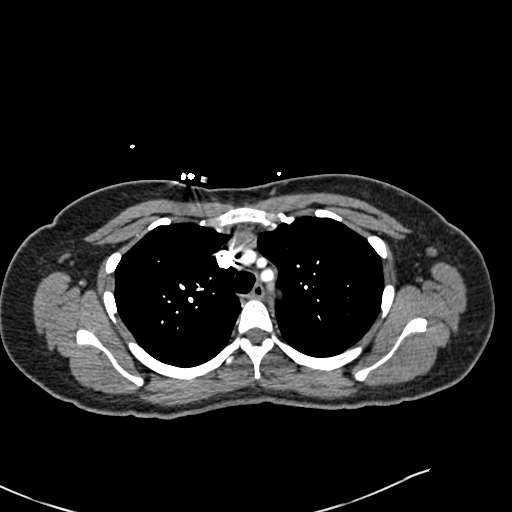
[im 182/221  lung]
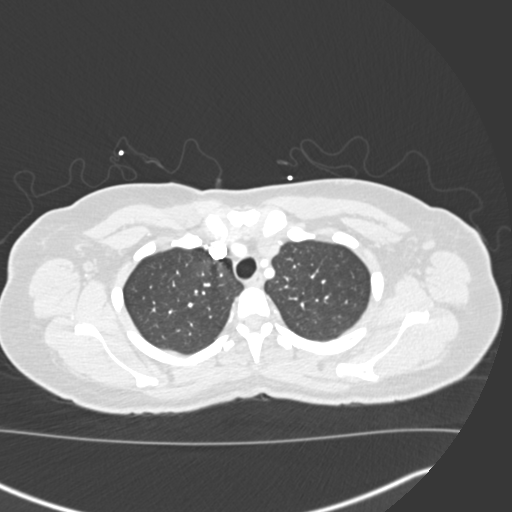
[im 192/221  soft-tissue]
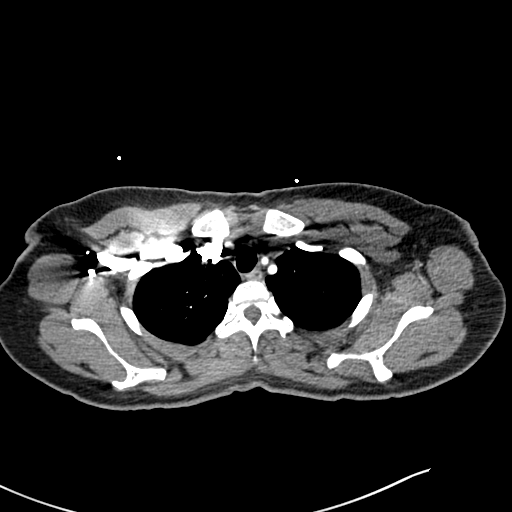
[im 211/221  lung]
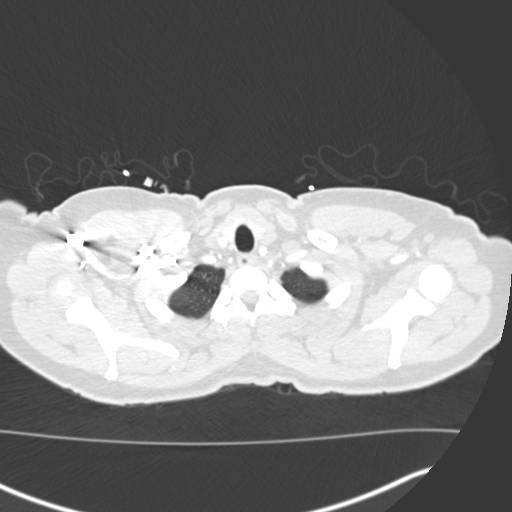

[Series 8: coronal mpr · coronal · 0.44mm/px · 3 of 112 slices shown]
[im 28/112  soft-tissue]
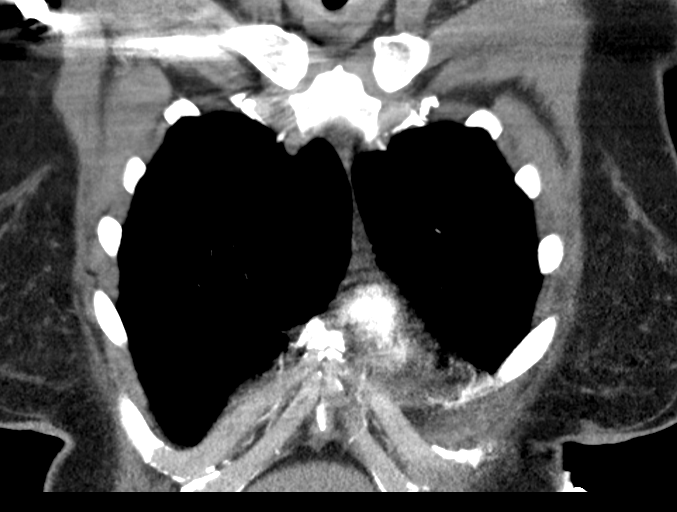
[im 56/112  soft-tissue]
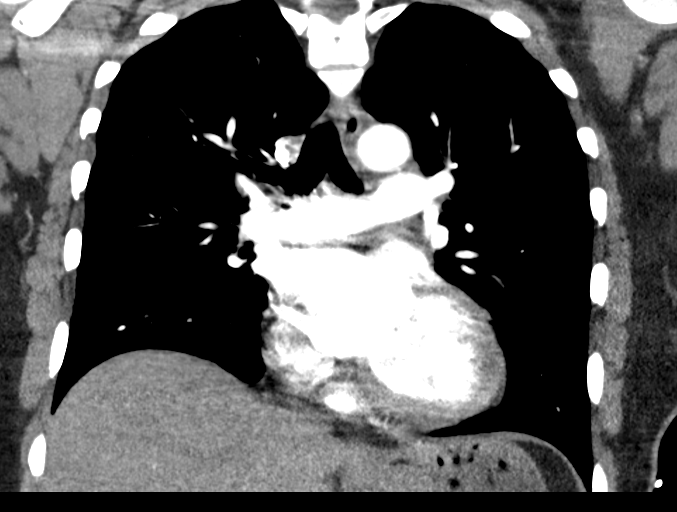
[im 84/112  soft-tissue]
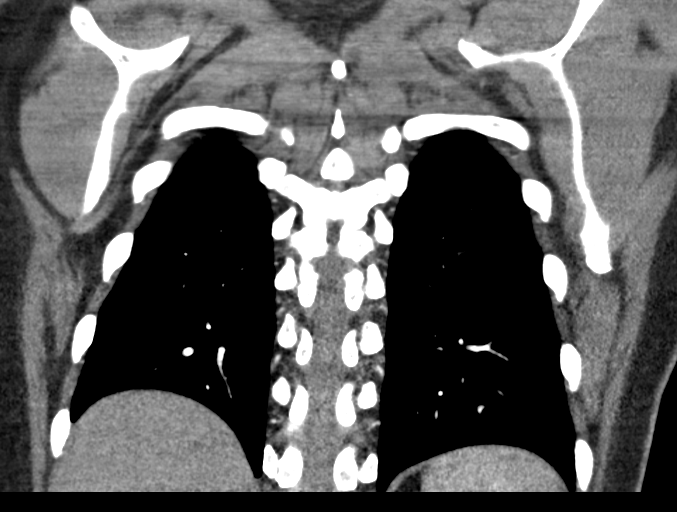

[18 of 46 positions shown; findings below may reference images not displayed]

FINDINGS: Cardiovascular: Good opacification of the central and segmental
pulmonary arteries. No focal filling defects. No evidence of
significant pulmonary embolus. Normal caliber thoracic aorta. No
aortic dissection. Great vessel origins are patent. Normal heart
size. No pericardial effusions.

Mediastinum/Nodes: Esophagus is decompressed. No significant
lymphadenopathy in the chest.

Lungs/Pleura: Nodular scarring in the anterior left lung base,
unchanged since prior study. Lungs are otherwise clear and expanded.
No pleural effusions. No pneumothorax. Airways are patent.

Upper Abdomen: No acute abnormalities.

Musculoskeletal: No chest wall abnormality. No acute or significant
osseous findings.

Review of the MIP images confirms the above findings.
IMPRESSION: No evidence of significant pulmonary embolus. No evidence of active
pulmonary disease.

## 2020-02-25 IMAGING — CT CT ABD-PELV W/ CM
2 of 5 series · 15 of 46 positions shown, 17 images · IV contrast (Isovue)
Comparison: Chest CT 01/13/2019.  Pelvic ultrasound 01/06/2018

CLINICAL DATA: Pelvic pain starting on 01/12/2019. Vaginal
bleeding. Abdominal distention. Prior DNC on 12/15/2018.

EXAM:
CT ABDOMEN AND PELVIS WITH CONTRAST
TECHNIQUE: Multidetector CT imaging of the abdomen and pelvis was performed
using the standard protocol following bolus administration of
intravenous contrast.
CONTRAST:  30mL HYDXMQ-E11 IOPAMIDOL (HYDXMQ-E11) INJECTION 61%,
100mL OMNIPAQUE IOHEXOL 300 MG/ML SOLN

[Series 2: axial st · axial · 0.67mm/px · z∈[-340,+44]mm · 12 of 87 slices shown, 14 images]
[im 5/87  soft-tissue]
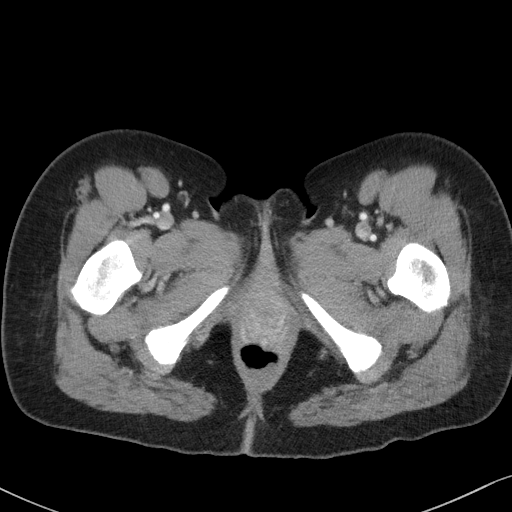
[im 5/87  bone]
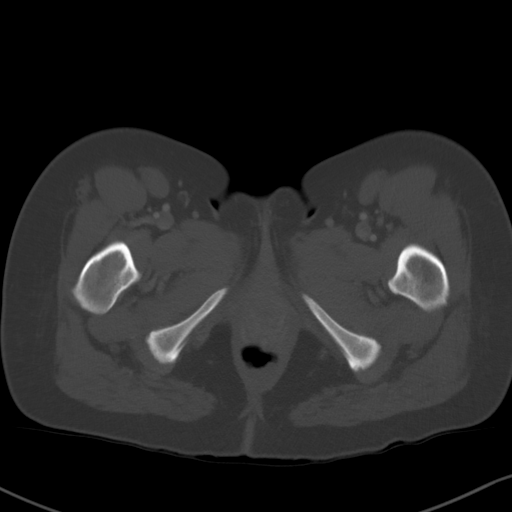
[im 15/87  soft-tissue]
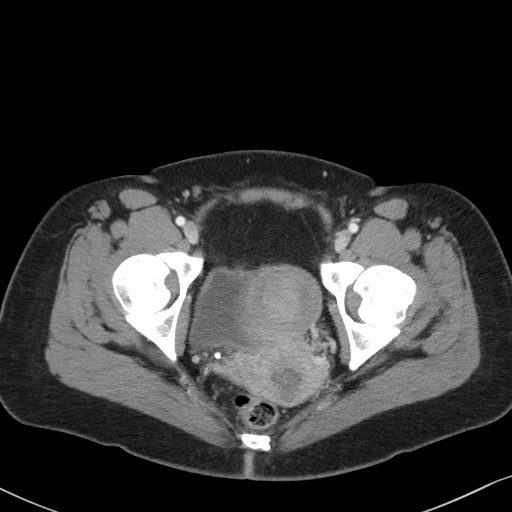
[im 20/87  soft-tissue]
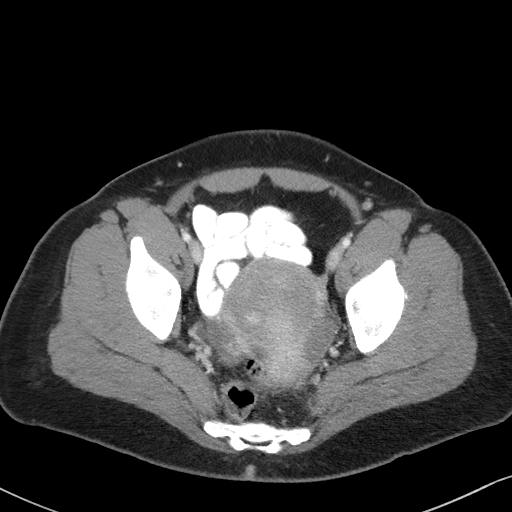
[im 24/87  soft-tissue]
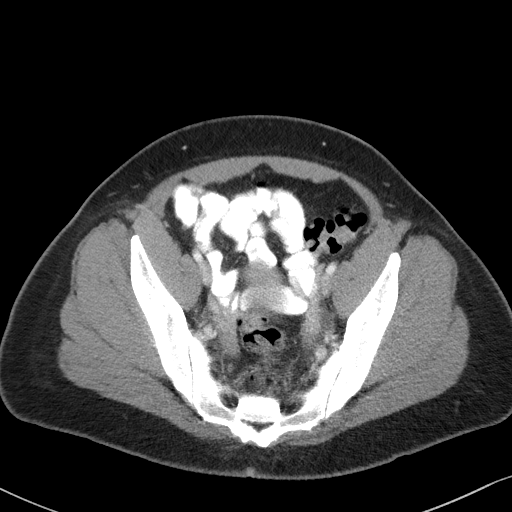
[im 34/87  soft-tissue]
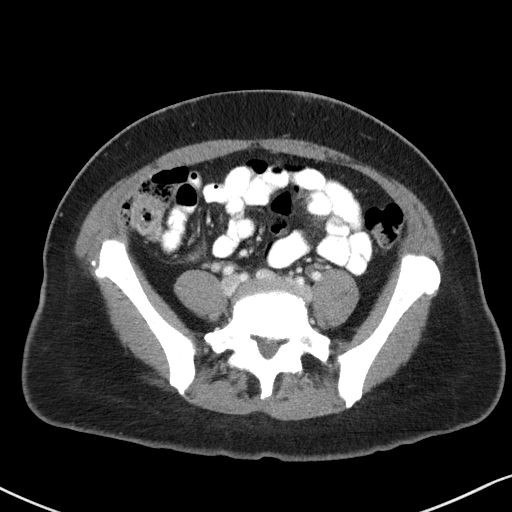
[im 39/87  soft-tissue]
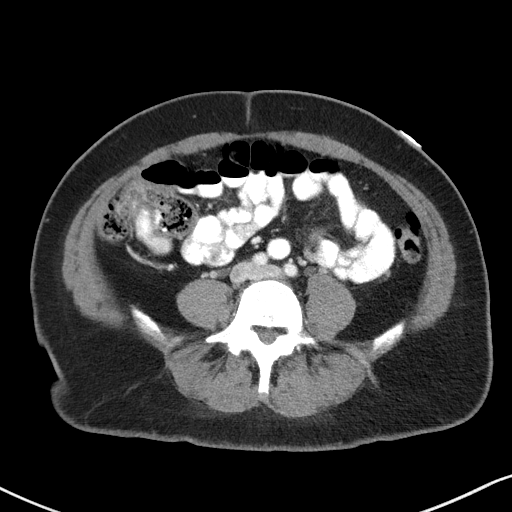
[im 48/87  soft-tissue]
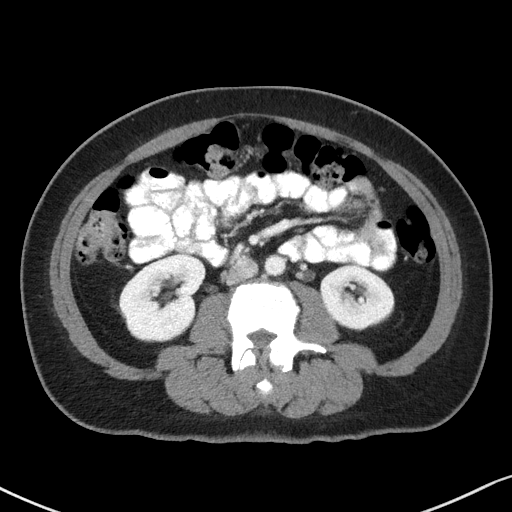
[im 53/87  soft-tissue]
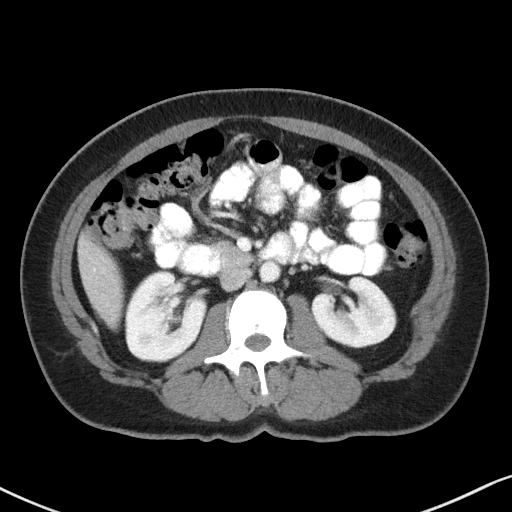
[im 63/87  soft-tissue]
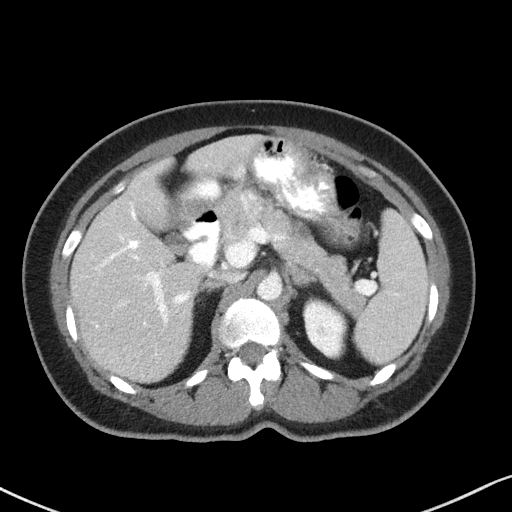
[im 63/87  bone]
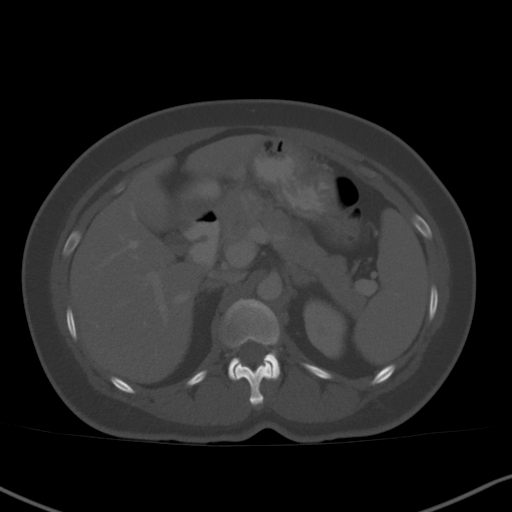
[im 67/87  soft-tissue]
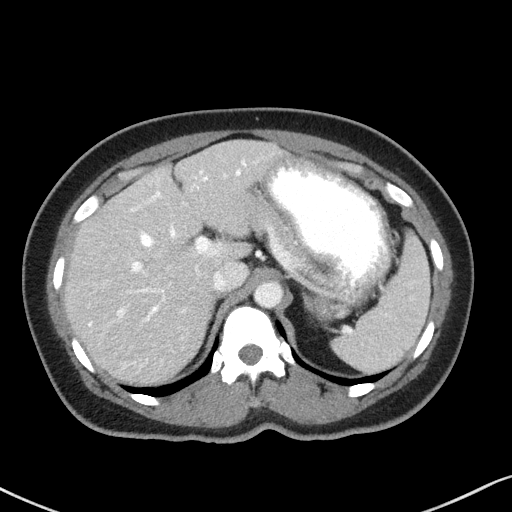
[im 72/87  soft-tissue]
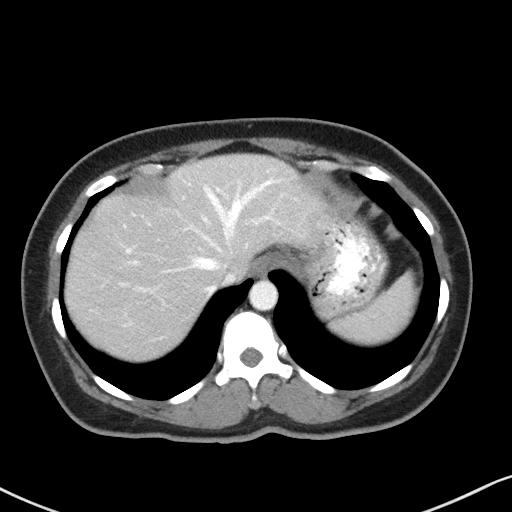
[im 82/87  soft-tissue]
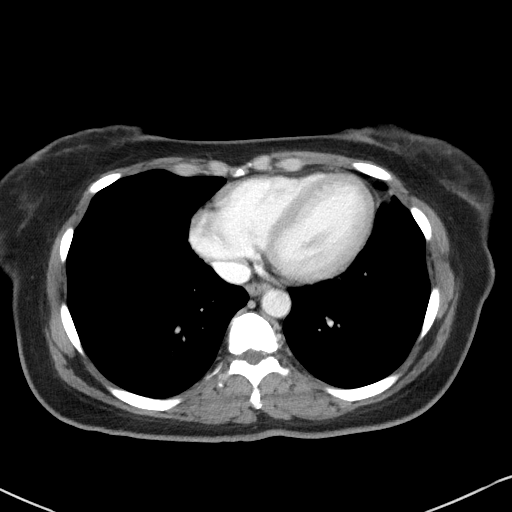

[Series 6: coronal st · coronal · 0.65mm/px · 3 of 99 slices shown]
[im 33/99  soft-tissue]
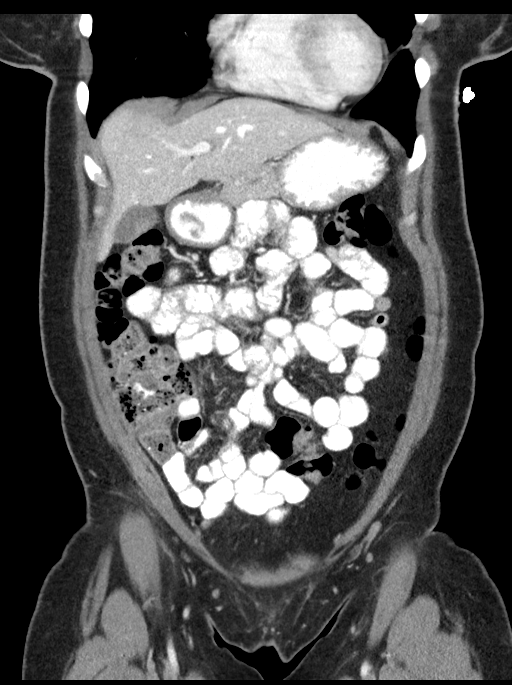
[im 44/99  soft-tissue]
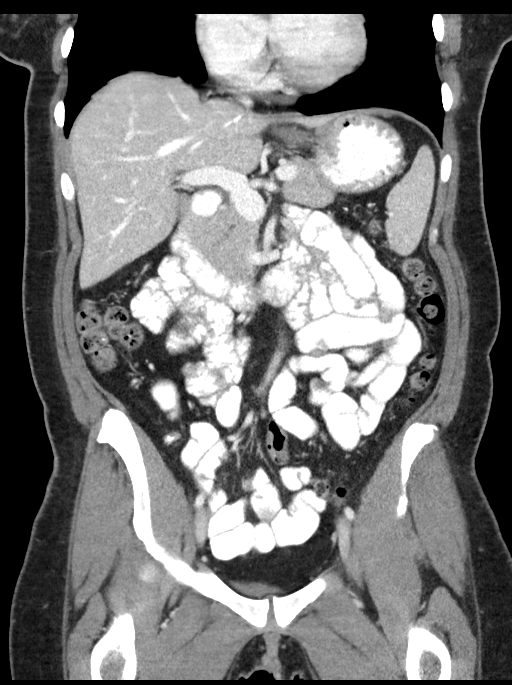
[im 55/99  soft-tissue]
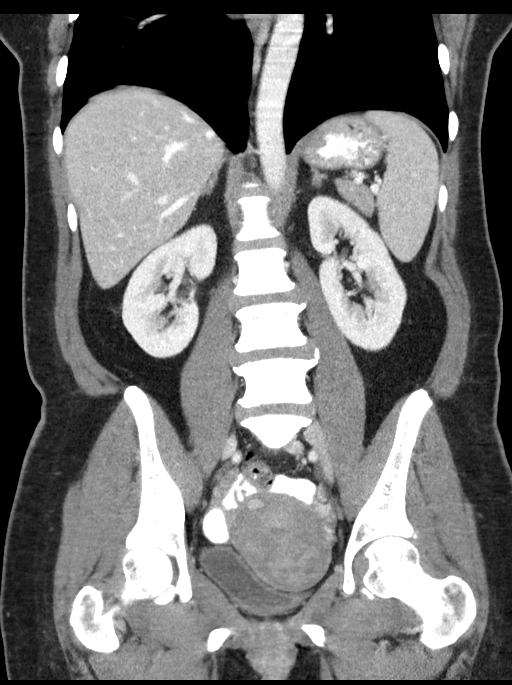

[15 of 46 positions shown; findings below may reference images not displayed]

FINDINGS: Lower chest: Stable scarring or atelectasis in the lingula.

Hepatobiliary: 5 mm hypodense lesion posteriorly in the right
hepatic lobe on image [DATE], technically too small to characterize
although statistically likely to be benign. Gallbladder
unremarkable. No appreciable biliary dilatation.

Pancreas: Unremarkable

Spleen: Unremarkable

Adrenals/Urinary Tract: A 1.2 by 1.6 cm left adrenal mass has a
relative washout of 72%, consistent with adrenal adenoma.

Fluid density 6 mm lesion of the right kidney upper pole is likely a
cyst although technically too small to characterize. Otherwise
unremarkable.

Stomach/Bowel: Unremarkable

Vascular/Lymphatic: Unremarkable

Reproductive: The endometrium does not appear particularly
thickened, at about 5 mm. Heterogeneity and masslike appearance of
the anterior uterine body suspicious for a 4.5 cm in diameter
fibroid. There is heterogeneity and mild prominence of the cervix,
and inflammation in this vicinity is not excluded. The ovaries
appear fairly unremarkable.

Other: No supplemental non-categorized findings.

Musculoskeletal: Grade 1 degenerative retrolisthesis at L4-5 with
moderate right and mild left foraminal impingement at the L4-5 level
due to disc bulge and facet arthropathy. There is also a disc bulge
at L3-4.
IMPRESSION: 1. Heterogeneous and indistinct cervix, inflammation of the cervix
is not excluded.
2. Suspected anterior uterine body fibroid.
3. Endometrium is not currently significantly thickened in the
uterine body, although the cervical endometrium is less certain.
4. Left adrenal adenoma.
5. Impingement at L4-5 due to spondylosis and degenerative disc
disease.

## 2020-02-29 ENCOUNTER — Other Ambulatory Visit: Payer: 59 | Admitting: Orthotics

## 2020-02-29 ENCOUNTER — Ambulatory Visit: Payer: 59 | Admitting: Podiatry

## 2020-03-01 ENCOUNTER — Ambulatory Visit: Payer: 59 | Attending: Internal Medicine

## 2020-03-01 DIAGNOSIS — Z23 Encounter for immunization: Secondary | ICD-10-CM

## 2020-03-01 NOTE — Progress Notes (Signed)
   Covid-19 Vaccination Clinic  Name:  Jennifer Mullins    MRN: [id number] DOB: 1971-05-31  03/01/2020  [name] was observed post Covid-19 immunization for 30 minutes based on pre-vaccination screening without incident. She was provided with Vaccine Information Sheet and instruction to access the V-Safe system.   [name] was instructed to call 911 with any severe reactions post vaccine: Marland Kitchen Difficulty breathing  . Swelling of face and throat  . A fast heartbeat  . A bad rash all over body  . Dizziness and weakness   Immunizations Administered    Name Date Dose VIS Date Route   Moderna COVID-19 Vaccine 03/01/2020 10:30 AM 0.5 mL 10/19/2019 Intramuscular   Manufacturer: Moderna   Lot: [id number]   [id number]

## 2020-03-03 ENCOUNTER — Ambulatory Visit: Payer: 59 | Admitting: Family Medicine

## 2020-03-03 ENCOUNTER — Ambulatory Visit: Payer: 59 | Admitting: Physician Assistant

## 2020-03-07 ENCOUNTER — Ambulatory Visit: Payer: 59 | Admitting: Allergy and Immunology

## 2020-03-09 ENCOUNTER — Ambulatory Visit (INDEPENDENT_AMBULATORY_CARE_PROVIDER_SITE_OTHER): Payer: 59 | Admitting: Physician Assistant

## 2020-03-09 ENCOUNTER — Other Ambulatory Visit: Payer: Self-pay

## 2020-03-09 ENCOUNTER — Encounter: Payer: Self-pay | Admitting: Physician Assistant

## 2020-03-09 VITALS — BP 134/93

## 2020-03-09 DIAGNOSIS — L820 Inflamed seborrheic keratosis: Secondary | ICD-10-CM | POA: Diagnosis not present

## 2020-03-09 DIAGNOSIS — L680 Hirsutism: Secondary | ICD-10-CM | POA: Diagnosis not present

## 2020-03-09 DIAGNOSIS — L918 Other hypertrophic disorders of the skin: Secondary | ICD-10-CM | POA: Diagnosis not present

## 2020-03-09 MED ORDER — SPIRONOLACTONE 50 MG PO TABS: 50.0000 mg | ORAL_TABLET | Freq: Two times a day (BID) | ORAL | 2 refills | Status: DC

## 2020-03-09 NOTE — Progress Notes (Signed)
   Follow up Visit  Subjective  Jennifer Mullins is a 49 y.o. female who presents for the following: Follow-up (Patient here today for LN2 follow up on right armpit x 1 (skin tag), under left breast x 2 (skin tags), right breast x5(SK) and left breast x 5(ISK).  Per patient all areas are healing well most of them have fallen off.  Patient also following up with Spironolactone doing good with that also.).  Objective  Well appearing patient in no apparent distress; mood and affect are within normal limits.  A focused examination was performed including chest, breasts, face.. Relevant physical exam findings are noted in the Assessment and Plan.   Objective  Left Breast (3), Right Breast (3), Right Malar Cheek: Erythematous stuck-on, waxy papule or plaque.   Objective  Right Malar Cheek: Fleshy, skin-colored sessile and pedunculated papules.    Assessment & Plan  Hirsutism (2) Head - Anterior (Face); Chest - Medial [facility])  Other Related Procedures Potassium To have done in two months.  Ordered Medications: spironolactone (ALDACTONE) 50 MG tablet  Other Related Medications VANIQA 13.9 % cream  Inflamed seborrheic keratosis (7) Left Breast (3); Right Breast (3); Right Malar Cheek  Destruction of lesion - Left Breast, Right Breast, Right Malar Cheek Complexity: simple   Destruction method: cryotherapy   Informed consent: discussed and consent obtained   Timeout:  patient name, date of birth, surgical site, and procedure verified Lesion destroyed using liquid nitrogen: Yes   Outcome: patient tolerated procedure well with no complications    Skin tag Right Malar Cheek  Epidermal / dermal shaving - Right Malar Cheek  Informed consent: discussed and consent obtained   Timeout: patient name, date of birth, surgical site, and procedure verified   Instrument used: scissors   Hemostasis achieved with: ferric subsulfate   Outcome: patient tolerated procedure well

## 2020-03-10 ENCOUNTER — Telehealth: Payer: Self-pay | Admitting: Physician Assistant

## 2020-03-10 NOTE — Telephone Encounter (Signed)
Patient called and said that her medication list was wrong and that we needed to correct.   Patient wants:   1.  Budesonide-formoterol (Symbicort)                   80-4.5 MCG/ACT inhaler directions to be 2        puffs into lungs daily.  (not 2 times daily)  2. HYDROcodone-acetaminophen (NORCO)        5-325 MG tablet.  Directions should be 1           tablet every 8 hours.  3. Celacyn gel needs to be taken off the                 medication list as Bonna no longer uses this      medication.  4.  Albuterol  (2.5 MG/3ML) 0.083% nebulizer         solution needs to be taken off list as Mihika       no longer takes this medication.  Patient will be coming by this afternoon and wants to pick up a corrected copy of this medication list.

## 2020-03-10 NOTE — Telephone Encounter (Signed)
This encounter was created in error - please disregard.

## 2020-03-10 NOTE — Telephone Encounter (Signed)
I explained to patient that I did update her medications and that the Symbicort is updated I just couldn't take out what the Prescribed Physician for that medication put in. I explained to the patient that I did add how she's taking the medication verses how the Physician that originally Prescribed the medication has it written.  Patient states that she has an appointment with that Physician on Tuesday and she will have him fix it.

## 2020-03-10 NOTE — Telephone Encounter (Signed)
Patient came by office to say that the directions for Budesonide-Formoterol (Symbicort) 80-4.5 MCG/ACT are still not correct.  Directions on prescription package say inhale 2 puffs by mouth every day NOT 2 puffs twice daily.

## 2020-03-10 NOTE — Telephone Encounter (Signed)
Phone call to patient to let her know that her medication list had been updated.  Patient wanted to know if she could pick up a new copy of her medication list.  Patient informed that she can pick up a new medication list one would be printed out for her and up front when she gets here.  Patient aware.

## 2020-03-14 ENCOUNTER — Ambulatory Visit: Payer: 59 | Admitting: Allergy and Immunology

## 2020-03-16 ENCOUNTER — Ambulatory Visit: Payer: 59 | Admitting: Physician Assistant

## 2020-03-16 ENCOUNTER — Other Ambulatory Visit: Payer: Self-pay | Admitting: *Deleted

## 2020-03-16 ENCOUNTER — Ambulatory Visit (INDEPENDENT_AMBULATORY_CARE_PROVIDER_SITE_OTHER): Payer: 59

## 2020-03-16 DIAGNOSIS — J309 Allergic rhinitis, unspecified: Secondary | ICD-10-CM

## 2020-03-21 ENCOUNTER — Ambulatory Visit: Payer: Self-pay

## 2020-03-22 ENCOUNTER — Other Ambulatory Visit: Payer: Self-pay

## 2020-03-22 ENCOUNTER — Ambulatory Visit (INDEPENDENT_AMBULATORY_CARE_PROVIDER_SITE_OTHER): Payer: 59 | Admitting: *Deleted

## 2020-03-22 DIAGNOSIS — J4550 Severe persistent asthma, uncomplicated: Secondary | ICD-10-CM

## 2020-03-22 DIAGNOSIS — J4540 Moderate persistent asthma, uncomplicated: Secondary | ICD-10-CM

## 2020-03-23 ENCOUNTER — Ambulatory Visit: Payer: 59 | Admitting: Orthotics

## 2020-03-23 ENCOUNTER — Encounter: Payer: Self-pay | Admitting: Podiatry

## 2020-03-23 ENCOUNTER — Ambulatory Visit (INDEPENDENT_AMBULATORY_CARE_PROVIDER_SITE_OTHER): Payer: 59 | Admitting: Podiatry

## 2020-03-23 DIAGNOSIS — G5762 Lesion of plantar nerve, left lower limb: Secondary | ICD-10-CM | POA: Diagnosis not present

## 2020-03-23 DIAGNOSIS — G5782 Other specified mononeuropathies of left lower limb: Secondary | ICD-10-CM

## 2020-03-23 DIAGNOSIS — D3610 Benign neoplasm of peripheral nerves and autonomic nervous system, unspecified: Secondary | ICD-10-CM

## 2020-03-23 NOTE — Progress Notes (Signed)
Shorten sulcus length f/o

## 2020-03-23 NOTE — Progress Notes (Signed)
She presents today for follow-up of neuroma third interdigital space of the left foot states that it still hurts but I do not need another injection today because I just had an injection for my breathing second have those in the same day. And she is going to see Liliane Channel today to help adjust her inserts. She is also concerned about the white spo in the third interdigital space. I expressed to her that that was because of the cortisone shot.  Objective: Vital signs are stable she alert oriented x3 she still has a diastases between the third and fourth toes of the left foot which bothers her. A area of hypopigmentation is also noted. There is no open lesions or wounds. Still has palpable Mulder's click and pain palpation of the third interspace left foot.  Assessment: Neuroma third interspace left. Diastases of the toes.  Plan: She is going to follow-up with Liliane Channel today to have her orthotics reworked. Follow-up with her in about 3 weeks or so for another injection.

## 2020-03-27 ENCOUNTER — Ambulatory Visit (INDEPENDENT_AMBULATORY_CARE_PROVIDER_SITE_OTHER): Payer: 59

## 2020-03-27 DIAGNOSIS — J309 Allergic rhinitis, unspecified: Secondary | ICD-10-CM

## 2020-03-29 ENCOUNTER — Emergency Department (HOSPITAL_COMMUNITY): Payer: 59

## 2020-03-29 ENCOUNTER — Encounter (HOSPITAL_COMMUNITY): Payer: Self-pay | Admitting: Emergency Medicine

## 2020-03-29 ENCOUNTER — Emergency Department (HOSPITAL_COMMUNITY)
Admission: EM | Admit: 2020-03-29 | Discharge: 2020-03-29 | Disposition: A | Discharge status: Home / Self Care | Payer: 59 | Attending: Emergency Medicine | Admitting: Emergency Medicine

## 2020-03-29 ENCOUNTER — Other Ambulatory Visit: Payer: Self-pay

## 2020-03-29 DIAGNOSIS — W228XXA Striking against or struck by other objects, initial encounter: Secondary | ICD-10-CM | POA: Diagnosis not present

## 2020-03-29 DIAGNOSIS — Z87891 Personal history of nicotine dependence: Secondary | ICD-10-CM | POA: Diagnosis not present

## 2020-03-29 DIAGNOSIS — Y9389 Activity, other specified: Secondary | ICD-10-CM | POA: Insufficient documentation

## 2020-03-29 DIAGNOSIS — Z79899 Other long term (current) drug therapy: Secondary | ICD-10-CM | POA: Insufficient documentation

## 2020-03-29 DIAGNOSIS — R519 Headache, unspecified: Secondary | ICD-10-CM

## 2020-03-29 DIAGNOSIS — S0990XA Unspecified injury of head, initial encounter: Secondary | ICD-10-CM | POA: Diagnosis present

## 2020-03-29 DIAGNOSIS — Y92818 Other transport vehicle as the place of occurrence of the external cause: Secondary | ICD-10-CM | POA: Insufficient documentation

## 2020-03-29 DIAGNOSIS — I10 Essential (primary) hypertension: Secondary | ICD-10-CM | POA: Insufficient documentation

## 2020-03-29 DIAGNOSIS — S060X0A Concussion without loss of consciousness, initial encounter: Secondary | ICD-10-CM | POA: Diagnosis not present

## 2020-03-29 DIAGNOSIS — Y999 Unspecified external cause status: Secondary | ICD-10-CM | POA: Diagnosis not present

## 2020-03-29 MED ORDER — CELECOXIB 200 MG PO CAPS: 200.0000 mg | ORAL_CAPSULE | Freq: Two times a day (BID) | ORAL | 0 refills | Status: DC

## 2020-03-29 NOTE — Discharge Instructions (Addendum)
Get help right away if: °You have severe or worsening headaches. °You have weakness or numbness in any part of your body. °You are confused. °Your coordination gets worse. °You vomit repeatedly. °You are sleepier than normal. °Your speech is slurred. °You cannot recognize people or places. °You have a seizure. °It is difficult to wake you up. °You have unusual behavior changes. °You have changes in your vision. °You lose consciousness. °

## 2020-03-29 NOTE — ED Provider Notes (Signed)
[facility] EMERGENCY DEPARTMENT Provider Note   CSN: [id number] Arrival date & time: 03/29/20  1906     History Chief Complaint  Patient presents with  . Head Injury    [name] is a 49 y.o. female.  The history is provided by the patient.  Head Injury Location:  Frontal Time since incident:  6 hours Mechanism of injury: direct blow   Pain details:    Quality:  Aching and throbbing   Severity:  Moderate   Timing:  Constant   Progression:  Unchanged Chronicity:  New Relieved by:  Nothing Worsened by:  Pressure Ineffective treatments:  None tried Associated symptoms: disorientation   Associated symptoms: no blurred vision, no difficulty breathing, no double vision, no focal weakness, no headaches, no hearing loss, no loss of consciousness, no memory loss, no nausea, no neck pain, no numbness, no seizures, no tinnitus and no vomiting   Associated symptoms comment:  Light headed Risk factors: no alcohol use, no aspirin use, not elderly, no obesity and no occupational exposure        Past Medical History:  Diagnosis Date  . Anxiety   . Arthritis   . Asthma   . Bilateral calcaneal spurs   . Bilateral polycystic ovarian syndrome   . Bulging lumbar disc   . Carpal tunnel syndrome, bilateral   . COPD (chronic obstructive pulmonary disease) (HCC)    BRONCHITIS  . Diabetes mellitus without complication ([place])    TYPE 2   DX  4-5 YRS AGO  . ETD (Eustachian tube dysfunction), bilateral    left worse than right  . Fibromyalgia   . GAD (generalized anxiety disorder)   . GERD (gastroesophageal reflux disease)    TAKES PRESCRIPTION MEDS  . Glaucoma    BOTH EYES  . Hyperlipidemia   . Hypertension   . Hypothyroidism    NODULES ON THYROID  . PONV (postoperative nausea and vomiting)   . Pulmonary embolism ([place])   . Tachycardia   . TMJ (dislocation of temporomandibular joint)   . Trigger finger   . Trigger finger of left thumb     Patient Active Problem List   Diagnosis Date Noted  . Primary insomnia 02/10/2020  . Numbness of left hand 07/06/2019  . Rectal bleeding 02/01/2019  . Acne vulgaris 02/01/2019  . Hirsutism 01/29/2019  . Hypokalemia 01/13/2019  . Acute blood loss anemia 01/13/2019  . Symptomatic anemia   . TMJ (dislocation of temporomandibular joint) 08/24/2018  . Fibromyalgia 07/09/2018  . Trigger thumb, right thumb 06/15/2018  . Lipoma of lower extremity 06/15/2018  . 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 Dyspareunia, female 12/11/2017  . Menometrorrhagia 12/11/2017  . Pain in right ankle and joints of right foot 10/29/2017  . Chronic pain of right knee 10/29/2017  . Iron deficiency anemia due to chronic blood loss 07/07/2017  . S/P cervical spinal fusion 02/27/2017  . Dysfunctional uterine bleeding 11/24/2016  . Anticoagulated 11/24/2016  . Allergic rhinoconjunctivitis 07/28/2015  . Chronic headache 07/28/2015  . PCOS (polycystic ovarian syndrome) 06/20/2015  . 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 Hyperlipidemia 06/20/2015  . GAD (generalized anxiety disorder) 05/18/2014  . GERD (gastroesophageal reflux disease) 05/18/2014  . Chronic sinusitis 07/02/2013  . Extrinsic asthma 05/20/2013  . HTN (hypertension) 05/20/2013    Past Surgical History:  Procedure Laterality Date  . ANTERIOR CERVICAL DECOMP/DISCECTOMY FUSION N/A 11/13/2016   Procedure: Cervical five-six, Cervical six-seven Anterior Cervical Discectomy and Fusion, Allograft, Plate;  Surgeon: Marybelle Killings, MD;  Location: [place];  Service: Orthopedics;  Laterality: N/A;  . BREAST SURGERY    . CARPAL TUNNEL RELEASE    . DILATION AND CURETTAGE OF UTERUS    . EXPLORATORY LAPAROTOMY    . LIPOMA EXCISION Left 06/15/2018   Procedure: LEFT CALF LIPOMA EXCISION;  Surgeon: Marybelle Killings, MD;  Location: [place];  Service: Orthopedics;  Laterality: Left;  . NASAL SINUS SURGERY  07/22/13   Dr. Redmond Pulling in [place]  . plate and six screws in back    .  TRIGGER FINGER RELEASE Right 06/15/2018   Procedure: RIGHT TRIGGER THUMB RELEASE;  Surgeon: Marybelle Killings, MD;  Location: [place];  Service: Orthopedics;  Laterality: Right;     OB History    Gravida  1   Para  1   Term      Preterm  1   AB      Living  1     SAB      TAB      Ectopic      Multiple      Live Births              Family History  Problem Relation Age of Onset  . Hypertension Mother   . Hypertension Father   . Colon cancer Neg Hx   . Rectal cancer Neg Hx   . Stomach cancer Neg Hx   . Esophageal cancer Neg Hx     Social History   Tobacco Use  . Smoking status: Former Smoker    Packs/day: 0.25    Years: 5.00    Pack years: 1.25    Types: Cigarettes    Quit date: 11/18/1989    Years since quitting: 30.3  . Smokeless tobacco: Never Used  Substance Use Topics  . Alcohol use: No  . Drug use: No    Home Medications Prior to Admission medications   Medication Sig Start Date End Date Taking? Authorizing Provider  albuterol (VENTOLIN HFA) 108 (90 Base) MCG/ACT inhaler INHALE TWO PUFFS INTO THE LUNGS EVERY 6 HOURS AS NEEDED FOR WHEEZING OR SHORTNESS OF BREATH 10/05/19   Terald Sleeper, PA-C  ALPRAZolam (XANAX) 0.5 MG tablet TAKE 1 TABLET BY MOUTH TWICE DAILY AS NEEDED. MAY TAKE ONE MORE TABLET BEFORE FOR ALLERGY SHOTS ONCE A WEEK (30 DAY supply) 01/05/20   Terald Sleeper, PA-C  aspirin 81 MG chewable tablet Chew 81 mg by mouth daily.    [provider]  AUVI-Q 0.3 MG/0.3ML SOAJ injection Use for life threatening allergic reactions 11/17/19   Kozlow, Donnamarie Poag, MD  baclofen (LIORESAL) 10 MG tablet Take 1 tablet (10 mg total) by mouth 3 (three) times daily as needed. 02/23/20   Sharion Balloon, FNP  budesonide-formoterol (SYMBICORT) 80-4.5 MCG/ACT inhaler Inhale 2 puffs into the lungs 2 (two) times daily. Patient taking differently: Inhale 2 puffs into the lungs daily.  11/16/19   Kozlow, Donnamarie Poag, MD  cetirizine (ZYRTEC) 10 MG  tablet TAKE 1 TABLET BY MOUTH TWICE DAILY 11/22/19   Kozlow, Donnamarie Poag, MD  cromolyn (OPTICROM) 4 % ophthalmic solution Place 1 drop into both eyes 4 (four) times daily.  03/20/17   [provider]  cycloSPORINE (RESTASIS) 0.05 % ophthalmic emulsion Place 1 drop into both eyes 2 (two) times daily.    [provider]  diclofenac Sodium (VOLTAREN) 1 % GEL Apply 2 g topically 4 (four) times daily. 02/25/20   [provider]  Diethylpropion  HCl CR 75 MG TB24 Take 1 tablet (75 mg total) by mouth daily. 01/05/20   Terald Sleeper, PA-C  fluticasone (FLONASE) 50 MCG/ACT nasal spray USE 1-2 SPRAYS IN [facility] NOSTRIL THREE TIMES WEEKLY AS NEEDED 02/02/20   Terald Sleeper, PA-C  HYDROcodone-acetaminophen (NORCO/VICODIN) 5-325 MG tablet Take 1 tablet by mouth every 8 (eight) hours as needed for moderate pain.    [provider]  linaclotide Rolan Lipa) 290 MCG CAPS capsule Take 1 capsule (290 mcg total) by mouth daily before breakfast. 12/03/19   Terald Sleeper, PA-C  losartan (COZAAR) 25 MG tablet Take 1 tablet (25 mg total) by mouth daily. 07/05/19   Terald Sleeper, PA-C  medroxyPROGESTERone (PROVERA) 10 MG tablet TAKE 1 TABLET BY MOUTH DAILY 02/18/19   Florian Buff, MD  metFORMIN (GLUCOPHAGE) 500 MG tablet TAKE 1 TABLET BY MOUTH EVERY DAY WITH BREAKFAST 07/05/19   Terald Sleeper, PA-C  montelukast (SINGULAIR) 10 MG tablet Take 1 tablet (10 mg total) by mouth at bedtime. 07/05/19   Terald Sleeper, PA-C  NUCALA 100 MG SOLR inject 100 mg under the skin every 4 weeks (to be given by a healthcare provider) 03/01/19   Kozlow, Donnamarie Poag, MD  Olopatadine HCl 0.6 % SOLN USE TWO SPRAYS IN EACH NOSTRIL TWICE DAILY 02/22/20   Kozlow, Donnamarie Poag, MD  pantoprazole (PROTONIX) 20 MG tablet Take 1 tablet (20 mg total) by mouth daily. 11/16/19   Kozlow, Donnamarie Poag, MD  pravastatin (PRAVACHOL) 40 MG tablet Take 1 tablet (40 mg total) by mouth daily. 02/04/20   Dettinger, Fransisca Kaufmann, MD  spironolactone (ALDACTONE) 50 MG tablet  Take 1 tablet (50 mg total) by mouth 2 (two) times daily. 03/09/20   Clark-Bruning, Anderson Malta, PA-C  VANIQA 13.9 % cream APPLY TO AFFECTED AREA TWICE DAILY 07/05/19   Terald Sleeper, PA-C    Allergies    Demerol [meperidine], Morphine and related, Other, Cardizem  [diltiazem hcl], Diltiazem hcl, Lisinopril, Lyrica [pregabalin], and Tramadol  Review of Systems   Review of Systems  HENT: Negative for hearing loss and tinnitus.   Eyes: Negative for blurred vision and double vision.  Gastrointestinal: Negative for nausea and vomiting.  Musculoskeletal: Negative for neck pain.  Neurological: Negative for focal weakness, seizures, loss of consciousness, numbness and headaches.  Psychiatric/Behavioral: Negative for memory loss.    Physical Exam Updated Vital Signs BP 122/77 (BP Location: Left Arm)   Pulse 89   Temp 98.3 F (36.8 C) (Oral)   Resp 18   Ht 5\' 2"  (1.575 m)   Wt 64.9 kg   SpO2 100%   BMI 26.16 kg/m   Physical Exam Vitals and nursing note reviewed.  Constitutional:      General: She is not in acute distress.    Appearance: She is well-developed. She is not diaphoretic.  HENT:     Head: Normocephalic and atraumatic.  Eyes:     General: No scleral icterus.    Conjunctiva/sclera: Conjunctivae normal.  Cardiovascular:     Rate and Rhythm: Normal rate and regular rhythm.     Heart sounds: Normal heart sounds. No murmur. No friction rub. No gallop.   Pulmonary:     Effort: Pulmonary effort is normal. No respiratory distress.     Breath sounds: Normal breath sounds.  Abdominal:     General: Bowel sounds are normal. There is no distension.     Palpations: Abdomen is soft. There is no mass.     Tenderness: There  is no abdominal tenderness. There is no guarding.  Musculoskeletal:     Cervical back: Normal range of motion.  Skin:    General: Skin is warm and dry.  Neurological:     Mental Status: She is alert and oriented to person, place, and time.     Comments: Speech  is clear and goal oriented, follows commands Major Cranial nerves without deficit, no facial droop Normal strength in upper and lower extremities bilaterally including dorsiflexion and plantar flexion, strong and equal grip strength Sensation normal to light and sharp touch Moves extremities without ataxia, coordination intact Normal finger to nose and rapid alternating movements Neg romberg, no pronator drift Normal gait Normal heel-shin and balance   Psychiatric:        Behavior: Behavior normal.     ED Results / Procedures / Treatments   Labs (all labs ordered are listed, but only abnormal results are displayed) Labs Reviewed - No data to display  EKG None  Radiology No results found.  Procedures Procedures (including critical care time)  Medications Ordered in ED Medications - No data to display  ED Course  I have reviewed the triage vital signs and the nursing notes.  Pertinent labs & imaging results that were available during my care of the patient were reviewed by me and considered in my medical decision making (see chart for details).    MDM Rules/Calculators/A&P                       I ordered and reviewed images of Head CT which shows no abnormality by my interpretation. Patient symptoms consistent with concussion. No vomiting. No focal neurological deficits on physical exam.  Pt observed in the ED.    Discussed symptoms of post concussive syndrome and reasons to return to the emergency department including any new  severe headaches, disequilibrium, vomiting, double vision, extremity weakness, difficulty ambulating, or any other concerning symptoms. Patient will be discharged with information pertaining to diagnosis  Pt is safe for discharge at this time.  Final Clinical Impression(s) / ED Diagnoses Final diagnoses:  None    Rx / DC Orders ED Discharge Orders    None       Margarita Mail, PA-C 03/29/20 2111    Milton Ferguson, MD 03/29/20 2317
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
## 2020-04-03 ENCOUNTER — Other Ambulatory Visit: Payer: Self-pay

## 2020-04-03 ENCOUNTER — Encounter: Payer: Self-pay | Admitting: Obstetrics & Gynecology

## 2020-04-03 ENCOUNTER — Ambulatory Visit (INDEPENDENT_AMBULATORY_CARE_PROVIDER_SITE_OTHER): Payer: 59 | Admitting: Obstetrics & Gynecology

## 2020-04-03 ENCOUNTER — Other Ambulatory Visit (HOSPITAL_COMMUNITY)
Admission: RE | Admit: 2020-04-03 | Discharge: 2020-04-03 | Disposition: A | Discharge status: Home / Self Care | Payer: 59 | Source: Ambulatory Visit | Attending: Obstetrics & Gynecology | Admitting: Obstetrics & Gynecology

## 2020-04-03 VITALS — BP 117/79 | HR 96 | Ht 62.0 in | Wt 144.0 lb

## 2020-04-03 DIAGNOSIS — Z124 Encounter for screening for malignant neoplasm of cervix: Secondary | ICD-10-CM | POA: Diagnosis present

## 2020-04-03 DIAGNOSIS — R102 Pelvic and perineal pain: Secondary | ICD-10-CM

## 2020-04-03 DIAGNOSIS — N921 Excessive and frequent menstruation with irregular cycle: Secondary | ICD-10-CM

## 2020-04-03 DIAGNOSIS — D219 Benign neoplasm of connective and other soft tissue, unspecified: Secondary | ICD-10-CM

## 2020-04-03 LAB — POCT HEMOGLOBIN: Hemoglobin: 14.7 g/dL — AB (ref 11–14.6)

## 2020-04-03 NOTE — Progress Notes (Signed)
Chief Complaint  Patient presents with  . discuss fibroids and ovarian cysts    needs pap smear      49 y.o. G1P0101 Patient's last menstrual period was 03/18/2020. The current method of family planning is none.  Outpatient Encounter Medications as of 04/03/2020  Medication Sig  . albuterol (VENTOLIN HFA) 108 (90 Base) MCG/ACT inhaler INHALE TWO PUFFS INTO THE LUNGS EVERY 6 HOURS AS NEEDED FOR WHEEZING OR SHORTNESS OF BREATH  . ALPRAZolam (XANAX) 0.5 MG tablet TAKE 1 TABLET BY MOUTH TWICE DAILY AS NEEDED. MAY TAKE ONE MORE TABLET BEFORE FOR ALLERGY SHOTS ONCE A WEEK (30 DAY supply)  . aspirin 81 MG chewable tablet Chew 81 mg by mouth daily.  Marland Kitchen AUVI-Q 0.3 MG/0.3ML SOAJ injection Use for life threatening allergic reactions  . baclofen (LIORESAL) 10 MG tablet Take 1 tablet (10 mg total) by mouth 3 (three) times daily as needed.  . budesonide-formoterol (SYMBICORT) 80-4.5 MCG/ACT inhaler Inhale 2 puffs into the lungs 2 (two) times daily. (Patient taking differently: Inhale 2 puffs into the lungs daily. )  . cetirizine (ZYRTEC) 10 MG tablet TAKE 1 TABLET BY MOUTH TWICE DAILY  . cromolyn (OPTICROM) 4 % ophthalmic solution Place 1 drop into both eyes 4 (four) times daily.   . cycloSPORINE (RESTASIS) 0.05 % ophthalmic emulsion Place 1 drop into both eyes 2 (two) times daily.  . diclofenac Sodium (VOLTAREN) 1 % GEL Apply 2 g topically 4 (four) times daily.  . Diethylpropion HCl CR 75 MG TB24 Take 1 tablet (75 mg total) by mouth daily.  . fluticasone (FLONASE) 50 MCG/ACT nasal spray USE 1-2 SPRAYS IN EACH NOSTRIL THREE TIMES WEEKLY AS NEEDED  . HYDROcodone-acetaminophen (NORCO/VICODIN) 5-325 MG tablet Take 1 tablet by mouth every 8 (eight) hours as needed for moderate pain.  Marland Kitchen linaclotide (LINZESS) 290 MCG CAPS capsule Take 1 capsule (290 mcg total) by mouth daily before breakfast.  . losartan (COZAAR) 25 MG tablet Take 1 tablet (25 mg total) by mouth daily.  . medroxyPROGESTERone (PROVERA)  10 MG tablet TAKE 1 TABLET BY MOUTH DAILY  . metFORMIN (GLUCOPHAGE) 500 MG tablet TAKE 1 TABLET BY MOUTH EVERY DAY WITH BREAKFAST  . montelukast (SINGULAIR) 10 MG tablet Take 1 tablet (10 mg total) by mouth at bedtime.  Marland Kitchen NUCALA 100 MG SOLR inject 100 mg under the skin every 4 weeks (to be given by a healthcare provider)  . Olopatadine HCl 0.6 % SOLN USE TWO SPRAYS IN EACH NOSTRIL TWICE DAILY  . pantoprazole (PROTONIX) 20 MG tablet Take 1 tablet (20 mg total) by mouth daily.  . pravastatin (PRAVACHOL) 40 MG tablet Take 1 tablet (40 mg total) by mouth daily.  Marland Kitchen spironolactone (ALDACTONE) 50 MG tablet Take 1 tablet (50 mg total) by mouth 2 (two) times daily.  Marland Kitchen UNABLE TO FIND Allergy shots-both arms-weekly  . VANIQA 13.9 % cream APPLY TO AFFECTED AREA TWICE DAILY  . [DISCONTINUED] celecoxib (CELEBREX) 200 MG capsule Take 1 capsule (200 mg total) by mouth 2 (two) times daily.   Facility-Administered Encounter Medications as of 04/03/2020  Medication  . Mepolizumab SOLR 100 mg    Subjective Pt with several year history of heavy vginal bleeding pretty regular Bled down to 8.2 last year and was trnasfused Has seen Dr Evie Lacks and had a D&C 2/20 no ablation I saw her 2 years ago and in trying to manage her bleeding to some degree she was taking provera 21 days on 7 days off Has  hx of PE Still taking the proveral Hemoglobin today 14.7 Past Medical History:  Diagnosis Date  . Anxiety   . Arthritis   . Asthma   . Bilateral calcaneal spurs   . Bilateral polycystic ovarian syndrome   . Bulging lumbar disc   . Carpal tunnel syndrome, bilateral   . COPD (chronic obstructive pulmonary disease) (HCC)    BRONCHITIS  . Diabetes mellitus without complication ([place])    TYPE 2   DX  4-5 YRS AGO  . ETD (Eustachian tube dysfunction), bilateral    left worse than right  . Fibromyalgia   . GAD (generalized anxiety disorder)   . GERD (gastroesophageal reflux disease)    TAKES PRESCRIPTION MEDS  .  Glaucoma    BOTH EYES  . Hyperlipidemia   . Hypertension   . Hypothyroidism    NODULES ON THYROID  . PONV (postoperative nausea and vomiting)   . Pulmonary embolism ([place])   . Tachycardia   . TMJ (dislocation of temporomandibular joint)   . Trigger finger   . Trigger finger of left thumb     Past Surgical History:  Procedure Laterality Date  . ANTERIOR CERVICAL DECOMP/DISCECTOMY FUSION N/A 11/13/2016   Procedure: Cervical five-six, Cervical six-seven Anterior Cervical Discectomy and Fusion, Allograft, Plate;  Surgeon: Marybelle Killings, MD;  Location: [facility];  Service: Orthopedics;  Laterality: N/A;  . BREAST SURGERY    . CARPAL TUNNEL RELEASE    . DILATION AND CURETTAGE OF UTERUS    . EXPLORATORY LAPAROTOMY    . LIPOMA EXCISION Left 06/15/2018   Procedure: LEFT CALF LIPOMA EXCISION;  Surgeon: Marybelle Killings, MD;  Location: [facility];  Service: Orthopedics;  Laterality: Left;  . NASAL SINUS SURGERY  07/22/13   Dr. Redmond Pulling in [place]  . plate and six screws in back    . TRIGGER FINGER RELEASE Right 06/15/2018   Procedure: RIGHT TRIGGER THUMB RELEASE;  Surgeon: Marybelle Killings, MD;  Location: [facility];  Service: Orthopedics;  Laterality: Right;    OB History    Gravida  1   Para  1   Term      Preterm  1   AB      Living  1     SAB      TAB      Ectopic      Multiple      Live Births  1           Allergies  Allergen Reactions  . Demerol [Meperidine] Shortness Of Breath    FLUSHING AND SHORTNESS OF BREATH  . Morphine And Related Shortness Of Breath    Flushed and hot hyper Flushed and hot hyper Flushed and hot hyper Flushed and hot hyper  . Other Shortness Of Breath    Flushed and hot hyper Flushed and hot hyper Flushed and hot hyper Flushed and hot hyper Flushed and hot hyper  . Cardizem  [Diltiazem Hcl] Other (See Comments)  . Diltiazem Hcl Hives  . Lisinopril     Caused cough  . Lyrica [Pregabalin]     Chest pain, elevated  heart rate  . Tramadol Other (See Comments)    Tears up stomach    Social History   Socioeconomic History  . Marital status: Married    Spouse name: Not on file  . Number of children: Not on file  . Years of education: Not on file  . Highest education level: Not on file  Occupational History  . Occupation: Unemployed  Tobacco Use  . Smoking status: Former Smoker    Packs/day: 0.25    Years: 5.00    Pack years: 1.25    Types: Cigarettes    Quit date: 11/18/1989    Years since quitting: 30.3  . Smokeless tobacco: Never Used  Substance and Sexual Activity  . Alcohol use: No  . Drug use: No  . Sexual activity: Yes    Birth control/protection: None  Other Topics Concern  . Not on file  Social History Narrative  . Not on file   Social Determinants of Health   Financial Resource Strain:   . Difficulty of Paying Living Expenses:   Food Insecurity:   . Worried About Charity fundraiser in the Last Year:   . Arboriculturist in the Last Year:   Transportation Needs:   . Film/video editor (Medical):   Marland Kitchen Lack of Transportation (Non-Medical):   Physical Activity:   . Days of Exercise per Week:   . Minutes of Exercise per Session:   Stress:   . Feeling of Stress :   Social Connections:   . Frequency of Communication with Friends and Family:   . Frequency of Social Gatherings with Friends and Family:   . Attends Religious Services:   . Active Member of Clubs or Organizations:   . Attends Archivist Meetings:   Marland Kitchen Marital Status:     Family History  Problem Relation Age of Onset  . Hypertension Mother   . Hypertension Father   . Cancer Son   . Colon cancer Neg Hx   . Rectal cancer Neg Hx   . Stomach cancer Neg Hx   . Esophageal cancer Neg Hx     Medications:       Current Outpatient Medications:  .  albuterol (VENTOLIN HFA) 108 (90 Base) MCG/ACT inhaler, INHALE TWO PUFFS INTO THE LUNGS EVERY 6 HOURS AS NEEDED FOR WHEEZING OR SHORTNESS OF BREATH, Disp:  8.5 g, Rfl: 5 .  ALPRAZolam (XANAX) 0.5 MG tablet, TAKE 1 TABLET BY MOUTH TWICE DAILY AS NEEDED. MAY TAKE ONE MORE TABLET BEFORE FOR ALLERGY SHOTS ONCE A WEEK (30 DAY supply), Disp: 65 tablet, Rfl: 5 .  aspirin 81 MG chewable tablet, Chew 81 mg by mouth daily., Disp: , Rfl:  .  AUVI-Q 0.3 MG/0.3ML SOAJ injection, Use for life threatening allergic reactions, Disp: 4 each, Rfl: 3 .  baclofen (LIORESAL) 10 MG tablet, Take 1 tablet (10 mg total) by mouth 3 (three) times daily as needed., Disp: 90 tablet, Rfl: 5 .  budesonide-formoterol (SYMBICORT) 80-4.5 MCG/ACT inhaler, Inhale 2 puffs into the lungs 2 (two) times daily. (Patient taking differently: Inhale 2 puffs into the lungs daily. ), Disp: 1 Inhaler, Rfl: 5 .  cetirizine (ZYRTEC) 10 MG tablet, TAKE 1 TABLET BY MOUTH TWICE DAILY, Disp: 60 tablet, Rfl: 1 .  cromolyn (OPTICROM) 4 % ophthalmic solution, Place 1 drop into both eyes 4 (four) times daily. , Disp: , Rfl: 1 .  cycloSPORINE (RESTASIS) 0.05 % ophthalmic emulsion, Place 1 drop into both eyes 2 (two) times daily., Disp: , Rfl:  .  diclofenac Sodium (VOLTAREN) 1 % GEL, Apply 2 g topically 4 (four) times daily., Disp: , Rfl:  .  Diethylpropion HCl CR 75 MG TB24, Take 1 tablet (75 mg total) by mouth daily., Disp: 30 tablet, Rfl: 5 .  fluticasone (FLONASE) 50 MCG/ACT nasal spray, USE 1-2 SPRAYS IN EACH NOSTRIL THREE  TIMES WEEKLY AS NEEDED, Disp: 16 g, Rfl: 5 .  HYDROcodone-acetaminophen (NORCO/VICODIN) 5-325 MG tablet, Take 1 tablet by mouth every 8 (eight) hours as needed for moderate pain., Disp: , Rfl:  .  linaclotide (LINZESS) 290 MCG CAPS capsule, Take 1 capsule (290 mcg total) by mouth daily before breakfast., Disp: 30 capsule, Rfl: 5 .  losartan (COZAAR) 25 MG tablet, Take 1 tablet (25 mg total) by mouth daily., Disp: 90 tablet, Rfl: 3 .  medroxyPROGESTERone (PROVERA) 10 MG tablet, TAKE 1 TABLET BY MOUTH DAILY, Disp: 30 tablet, Rfl: 11 .  metFORMIN (GLUCOPHAGE) 500 MG tablet, TAKE 1 TABLET BY  MOUTH EVERY DAY WITH BREAKFAST, Disp: 90 tablet, Rfl: 3 .  montelukast (SINGULAIR) 10 MG tablet, Take 1 tablet (10 mg total) by mouth at bedtime., Disp: 90 tablet, Rfl: 3 .  NUCALA 100 MG SOLR, inject 100 mg under the skin every 4 weeks (to be given by a healthcare provider), Disp: 1 each, Rfl: 10 .  Olopatadine HCl 0.6 % SOLN, USE TWO SPRAYS IN EACH NOSTRIL TWICE DAILY, Disp: 30.5 g, Rfl: 4 .  pantoprazole (PROTONIX) 20 MG tablet, Take 1 tablet (20 mg total) by mouth daily., Disp: 30 tablet, Rfl: 5 .  pravastatin (PRAVACHOL) 40 MG tablet, Take 1 tablet (40 mg total) by mouth daily., Disp: 90 tablet, Rfl: 1 .  spironolactone (ALDACTONE) 50 MG tablet, Take 1 tablet (50 mg total) by mouth 2 (two) times daily., Disp: 60 tablet, Rfl: 2 .  UNABLE TO FIND, Allergy shots-both arms-weekly, Disp: , Rfl:  .  VANIQA 13.9 % cream, APPLY TO AFFECTED AREA TWICE DAILY, Disp: 45 g, Rfl: 5  Current Facility-Administered Medications:  Marland Kitchen  Mepolizumab SOLR 100 mg, 100 mg, Subcutaneous, Q28 days, Kozlow, Donnamarie Poag, MD, 100 mg at 03/22/20 1522  Objective Blood pressure 117/79, pulse 96, height 5\' 2"  (1.575 m), weight 144 lb (65.3 kg), last menstrual period 03/18/2020.  General [facility] female NAD Vulva:  normal appearing vulva with no masses, tenderness or lesions Vagina:  normal mucosa, no discharge Cervix:  no bleeding following Pap, no cervical motion tenderness and no lesions Uterus:  10 weeks size with tender fibroid, possible degenerating myoma, anterior Adnexa: ovaries:present,  normal adnexa in size, nontender and no masses   Pertinent ROS No burning with urination, frequency or urgency No nausea, vomiting or diarrhea Nor fever chills or other constitutional symptoms   Labs or studies Reviewed all her labs and scans from the last year or so    Impression Diagnoses this Encounter::   ICD-10-CM   1. Menometrorrhagia  N92.1 US PELVIS (TRANSABDOMINAL ONLY)    US Transvaginal Non-OB  2. Routine  cervical smear  Z12.4 Cytology - PAP( [place])  3. Fibroids  D21.9 POCT hemoglobin    US PELVIS (TRANSABDOMINAL ONLY)    US Transvaginal Non-OB  4. Pelvic pain  R10.2 US PELVIS (TRANSABDOMINAL ONLY)    US Transvaginal Non-OB    Established relevant diagnosis(es):   Plan/Recommendations: No orders of the defined types were placed in this encounter.   Labs or Scans Ordered: Orders Placed This Encounter  Procedures  . US PELVIS (TRANSABDOMINAL ONLY)  . US Transvaginal Non-OB  . POCT hemoglobin    Management:: Sonogram in 1-2 weeks for evaluation of pelvic anatomy  Follow up Return for GYN sono, Follow up, with Dr Elonda Husky.      All questions were answered.

## 2020-04-04 ENCOUNTER — Ambulatory Visit (INDEPENDENT_AMBULATORY_CARE_PROVIDER_SITE_OTHER): Payer: 59

## 2020-04-04 DIAGNOSIS — J309 Allergic rhinitis, unspecified: Secondary | ICD-10-CM

## 2020-04-05 ENCOUNTER — Telehealth: Payer: Self-pay | Admitting: Obstetrics & Gynecology

## 2020-04-05 NOTE — Telephone Encounter (Signed)
Pt states she is has a light period earlier in the year, none in March with odor. She states she has a yellow discharge. She is having pelvic pain with stomach swelling. No bleeding. She is wanting to see if she can have [place] before the 27th. She is able to come early Mon, Tues, or Wed. Pt will not be available after 12:30 today to speak with a nurse.

## 2020-04-05 NOTE — Telephone Encounter (Signed)
Telephoned patient at home number and patient states is having a discharge with some odor and is yellow. Patient wanted ultrasound earlier. Advised patient no appointments available for [place]. Scheduled patient to be checked for vaginal discharge. Patient voiced understanding.

## 2020-04-06 LAB — CYTOLOGY - PAP
Comment: NEGATIVE
Diagnosis: NEGATIVE
High risk HPV: NEGATIVE

## 2020-04-10 ENCOUNTER — Other Ambulatory Visit (HOSPITAL_COMMUNITY)
Admission: RE | Admit: 2020-04-10 | Discharge: 2020-04-10 | Disposition: A | Discharge status: Home / Self Care | Payer: 59 | Source: Ambulatory Visit | Attending: Obstetrics & Gynecology | Admitting: Obstetrics & Gynecology

## 2020-04-10 ENCOUNTER — Encounter: Payer: Self-pay | Admitting: Obstetrics & Gynecology

## 2020-04-10 ENCOUNTER — Ambulatory Visit (INDEPENDENT_AMBULATORY_CARE_PROVIDER_SITE_OTHER): Payer: 59 | Admitting: Obstetrics & Gynecology

## 2020-04-10 VITALS — BP 120/73 | HR 97 | Ht 62.0 in | Wt 144.0 lb

## 2020-04-10 DIAGNOSIS — N898 Other specified noninflammatory disorders of vagina: Secondary | ICD-10-CM

## 2020-04-10 DIAGNOSIS — R102 Pelvic and perineal pain: Secondary | ICD-10-CM

## 2020-04-10 NOTE — Progress Notes (Signed)
Chief Complaint  Patient presents with  . Vaginal Discharge    also having some pelvic pain      49 y.o. G1P0101 Patient's last menstrual period was 03/18/2020. The current method of family planning is tubal ligation.  Outpatient Encounter Medications as of 04/10/2020  Medication Sig  . albuterol (VENTOLIN HFA) 108 (90 Base) MCG/ACT inhaler INHALE TWO PUFFS INTO THE LUNGS EVERY 6 HOURS AS NEEDED FOR WHEEZING OR SHORTNESS OF BREATH  . ALPRAZolam (XANAX) 0.5 MG tablet TAKE 1 TABLET BY MOUTH TWICE DAILY AS NEEDED. MAY TAKE ONE MORE TABLET BEFORE FOR ALLERGY SHOTS ONCE A WEEK (30 DAY supply)  . aspirin 81 MG chewable tablet Chew 81 mg by mouth daily.  Marland Kitchen AUVI-Q 0.3 MG/0.3ML SOAJ injection Use for life threatening allergic reactions  . baclofen (LIORESAL) 10 MG tablet Take 1 tablet (10 mg total) by mouth 3 (three) times daily as needed.  . budesonide-formoterol (SYMBICORT) 80-4.5 MCG/ACT inhaler Inhale 2 puffs into the lungs 2 (two) times daily. (Patient taking differently: Inhale 2 puffs into the lungs daily. )  . cetirizine (ZYRTEC) 10 MG tablet TAKE 1 TABLET BY MOUTH TWICE DAILY  . cromolyn (OPTICROM) 4 % ophthalmic solution Place 1 drop into both eyes 4 (four) times daily.   . cycloSPORINE (RESTASIS) 0.05 % ophthalmic emulsion Place 1 drop into both eyes 2 (two) times daily.  . diclofenac Sodium (VOLTAREN) 1 % GEL Apply 2 g topically 4 (four) times daily.  . Diethylpropion HCl CR 75 MG TB24 Take 1 tablet (75 mg total) by mouth daily.  . fluticasone (FLONASE) 50 MCG/ACT nasal spray USE 1-2 SPRAYS IN EACH NOSTRIL THREE TIMES WEEKLY AS NEEDED  . HYDROcodone-acetaminophen (NORCO/VICODIN) 5-325 MG tablet Take 1 tablet by mouth every 8 (eight) hours as needed for moderate pain.  Marland Kitchen linaclotide (LINZESS) 290 MCG CAPS capsule Take 1 capsule (290 mcg total) by mouth daily before breakfast.  . losartan (COZAAR) 25 MG tablet Take 1 tablet (25 mg total) by mouth daily.  . medroxyPROGESTERone  (PROVERA) 10 MG tablet TAKE 1 TABLET BY MOUTH DAILY  . metFORMIN (GLUCOPHAGE) 500 MG tablet TAKE 1 TABLET BY MOUTH EVERY DAY WITH BREAKFAST  . montelukast (SINGULAIR) 10 MG tablet Take 1 tablet (10 mg total) by mouth at bedtime.  Marland Kitchen NUCALA 100 MG SOLR inject 100 mg under the skin every 4 weeks (to be given by a healthcare provider)  . Olopatadine HCl 0.6 % SOLN USE TWO SPRAYS IN EACH NOSTRIL TWICE DAILY  . pantoprazole (PROTONIX) 20 MG tablet Take 1 tablet (20 mg total) by mouth daily.  . pravastatin (PRAVACHOL) 40 MG tablet Take 1 tablet (40 mg total) by mouth daily.  Marland Kitchen spironolactone (ALDACTONE) 50 MG tablet Take 1 tablet (50 mg total) by mouth 2 (two) times daily.  Marland Kitchen UNABLE TO FIND Allergy shots-both arms-weekly  . VANIQA 13.9 % cream APPLY TO AFFECTED AREA TWICE DAILY   Facility-Administered Encounter Medications as of 04/10/2020  Medication  . Mepolizumab SOLR 100 mg    Subjective Pt is concerned regarding pelvic infection No specific STI concerns Having pelvic pressure which I have told her is from her fibroid  PCR done today Past Medical History:  Diagnosis Date  . Anxiety   . Arthritis   . Asthma   . Bilateral calcaneal spurs   . Bilateral polycystic ovarian syndrome   . Bulging lumbar disc   . Carpal tunnel syndrome, bilateral   . COPD (chronic obstructive pulmonary disease) ([facility] [facility])  BRONCHITIS  . Diabetes mellitus without complication ([facility])    TYPE 2   DX  4-5 YRS AGO  . ETD (Eustachian tube dysfunction), bilateral    left worse than right  . Fibromyalgia   . GAD (generalized anxiety disorder)   . GERD (gastroesophageal reflux disease)    TAKES PRESCRIPTION MEDS  . Glaucoma    BOTH EYES  . Hyperlipidemia   . Hypertension   . Hypothyroidism    NODULES ON THYROID  . PONV (postoperative nausea and vomiting)   . Pulmonary embolism ([facility])   . Tachycardia   . TMJ (dislocation of temporomandibular joint)   . Trigger finger   . Trigger finger of left thumb      Past Surgical History:  Procedure Laterality Date  . ANTERIOR CERVICAL DECOMP/DISCECTOMY FUSION N/A 11/13/2016   Procedure: Cervical five-six, Cervical six-seven Anterior Cervical Discectomy and Fusion, Allograft, Plate;  Surgeon: Marybelle Killings, MD;  Location: [facility];  Service: Orthopedics;  Laterality: N/A;  . BREAST SURGERY    . CARPAL TUNNEL RELEASE    . DILATION AND CURETTAGE OF UTERUS    . EXPLORATORY LAPAROTOMY    . LIPOMA EXCISION Left 06/15/2018   Procedure: LEFT CALF LIPOMA EXCISION;  Surgeon: Marybelle Killings, MD;  Location: [facility];  Service: Orthopedics;  Laterality: Left;  . NASAL SINUS SURGERY  07/22/13   Dr. Redmond Pulling in [place]  . plate and six screws in back    . TRIGGER FINGER RELEASE Right 06/15/2018   Procedure: RIGHT TRIGGER THUMB RELEASE;  Surgeon: Marybelle Killings, MD;  Location: [facility];  Service: Orthopedics;  Laterality: Right;    OB History    Gravida  1   Para  1   Term      Preterm  1   AB      Living  1     SAB      TAB      Ectopic      Multiple      Live Births  1           Allergies  Allergen Reactions  . Demerol [Meperidine] Shortness Of Breath    FLUSHING AND SHORTNESS OF BREATH  . Morphine And Related Shortness Of Breath    Flushed and hot hyper Flushed and hot hyper Flushed and hot hyper Flushed and hot hyper  . Other Shortness Of Breath    Flushed and hot hyper Flushed and hot hyper Flushed and hot hyper Flushed and hot hyper Flushed and hot hyper  . Cardizem  [Diltiazem Hcl] Other (See Comments)  . Diltiazem Hcl Hives  . Lisinopril     Caused cough  . Lyrica [Pregabalin]     Chest pain, elevated heart rate  . Tramadol Other (See Comments)    Tears up stomach    Social History   Socioeconomic History  . Marital status: Married    Spouse name: Not on file  . Number of children: Not on file  . Years of education: Not on file  . Highest education level: Not on file  Occupational  History  . Occupation: Unemployed  Tobacco Use  . Smoking status: Former Smoker    Packs/day: 0.25    Years: 5.00    Pack years: 1.25    Types: Cigarettes    Quit date: 11/18/1989    Years since quitting: 30.4  . Smokeless tobacco: Never Used  Substance and Sexual Activity  . Alcohol use: No  .  Drug use: No  . Sexual activity: Yes    Birth control/protection: None  Other Topics Concern  . Not on file  Social History Narrative  . Not on file   Social Determinants of Health   Financial Resource Strain:   . Difficulty of Paying Living Expenses:   Food Insecurity:   . Worried About Charity fundraiser in the Last Year:   . Arboriculturist in the Last Year:   Transportation Needs:   . Film/video editor (Medical):   Marland Kitchen Lack of Transportation (Non-Medical):   Physical Activity:   . Days of Exercise per Week:   . Minutes of Exercise per Session:   Stress:   . Feeling of Stress :   Social Connections:   . Frequency of Communication with Friends and Family:   . Frequency of Social Gatherings with Friends and Family:   . Attends Religious Services:   . Active Member of Clubs or Organizations:   . Attends Archivist Meetings:   Marland Kitchen Marital Status:     Family History  Problem Relation Age of Onset  . Hypertension Mother   . Hypertension Father   . Cancer Son   . Colon cancer Neg Hx   . Rectal cancer Neg Hx   . Stomach cancer Neg Hx   . Esophageal cancer Neg Hx     Medications:       Current Outpatient Medications:  .  albuterol (VENTOLIN HFA) 108 (90 Base) MCG/ACT inhaler, INHALE TWO PUFFS INTO THE LUNGS EVERY 6 HOURS AS NEEDED FOR WHEEZING OR SHORTNESS OF BREATH, Disp: 8.5 g, Rfl: 5 .  ALPRAZolam (XANAX) 0.5 MG tablet, TAKE 1 TABLET BY MOUTH TWICE DAILY AS NEEDED. MAY TAKE ONE MORE TABLET BEFORE FOR ALLERGY SHOTS ONCE A WEEK (30 DAY supply), Disp: 65 tablet, Rfl: 5 .  aspirin 81 MG chewable tablet, Chew 81 mg by mouth daily., Disp: , Rfl:  .  AUVI-Q 0.3  MG/0.3ML SOAJ injection, Use for life threatening allergic reactions, Disp: 4 each, Rfl: 3 .  baclofen (LIORESAL) 10 MG tablet, Take 1 tablet (10 mg total) by mouth 3 (three) times daily as needed., Disp: 90 tablet, Rfl: 5 .  budesonide-formoterol (SYMBICORT) 80-4.5 MCG/ACT inhaler, Inhale 2 puffs into the lungs 2 (two) times daily. (Patient taking differently: Inhale 2 puffs into the lungs daily. ), Disp: 1 Inhaler, Rfl: 5 .  cetirizine (ZYRTEC) 10 MG tablet, TAKE 1 TABLET BY MOUTH TWICE DAILY, Disp: 60 tablet, Rfl: 1 .  cromolyn (OPTICROM) 4 % ophthalmic solution, Place 1 drop into both eyes 4 (four) times daily. , Disp: , Rfl: 1 .  cycloSPORINE (RESTASIS) 0.05 % ophthalmic emulsion, Place 1 drop into both eyes 2 (two) times daily., Disp: , Rfl:  .  diclofenac Sodium (VOLTAREN) 1 % GEL, Apply 2 g topically 4 (four) times daily., Disp: , Rfl:  .  Diethylpropion HCl CR 75 MG TB24, Take 1 tablet (75 mg total) by mouth daily., Disp: 30 tablet, Rfl: 5 .  fluticasone (FLONASE) 50 MCG/ACT nasal spray, USE 1-2 SPRAYS IN EACH NOSTRIL THREE TIMES WEEKLY AS NEEDED, Disp: 16 g, Rfl: 5 .  HYDROcodone-acetaminophen (NORCO/VICODIN) 5-325 MG tablet, Take 1 tablet by mouth every 8 (eight) hours as needed for moderate pain., Disp: , Rfl:  .  linaclotide (LINZESS) 290 MCG CAPS capsule, Take 1 capsule (290 mcg total) by mouth daily before breakfast., Disp: 30 capsule, Rfl: 5 .  losartan (COZAAR) 25 MG tablet, Take 1  tablet (25 mg total) by mouth daily., Disp: 90 tablet, Rfl: 3 .  medroxyPROGESTERone (PROVERA) 10 MG tablet, TAKE 1 TABLET BY MOUTH DAILY, Disp: 30 tablet, Rfl: 11 .  metFORMIN (GLUCOPHAGE) 500 MG tablet, TAKE 1 TABLET BY MOUTH EVERY DAY WITH BREAKFAST, Disp: 90 tablet, Rfl: 3 .  montelukast (SINGULAIR) 10 MG tablet, Take 1 tablet (10 mg total) by mouth at bedtime., Disp: 90 tablet, Rfl: 3 .  NUCALA 100 MG SOLR, inject 100 mg under the skin every 4 weeks (to be given by a healthcare provider), Disp: 1 each,  Rfl: 10 .  Olopatadine HCl 0.6 % SOLN, USE TWO SPRAYS IN EACH NOSTRIL TWICE DAILY, Disp: 30.5 g, Rfl: 4 .  pantoprazole (PROTONIX) 20 MG tablet, Take 1 tablet (20 mg total) by mouth daily., Disp: 30 tablet, Rfl: 5 .  pravastatin (PRAVACHOL) 40 MG tablet, Take 1 tablet (40 mg total) by mouth daily., Disp: 90 tablet, Rfl: 1 .  spironolactone (ALDACTONE) 50 MG tablet, Take 1 tablet (50 mg total) by mouth 2 (two) times daily., Disp: 60 tablet, Rfl: 2 .  UNABLE TO FIND, Allergy shots-both arms-weekly, Disp: , Rfl:  .  VANIQA 13.9 % cream, APPLY TO AFFECTED AREA TWICE DAILY, Disp: 45 g, Rfl: 5  Current Facility-Administered Medications:  Marland Kitchen  Mepolizumab SOLR 100 mg, 100 mg, Subcutaneous, Q28 days, Kozlow, Donnamarie Poag, MD, 100 mg at 03/22/20 1522  Objective Blood pressure 120/73, pulse 97, height 5\' 2"  (1.575 m), weight 144 lb (65.3 kg), last menstrual period 03/18/2020.  General WDWN female NAD Vulva:  normal appearing vulva with no masses, tenderness or lesions Vagina:  normal mucosa, no discharge Cervix:  Normal no lesions PCR done  Wet prep is negative for BV trich yeast  Pertinent ROS No burning with urination, frequency or urgency No nausea, vomiting or diarrhea Nor fever chills or other constitutional symptoms   Labs or studies     Impression Diagnoses this Encounter::   ICD-10-CM   1. Vaginal discharge  N89.8 Cervicovaginal ancillary only( )   wet prep is negative, PCR done  2. Pelvic pain  R10.2     Established relevant diagnosis(es):   Plan/Recommendations: No orders of the defined types were placed in this encounter.   Labs or Scans Ordered: No orders of the defined types were placed in this encounter.   Management:: Keep sonogram appointment as scheduled  Follow up Return for keep scheduled, change her follow up appt with me to a my chart video visit.      All questions were answered.

## 2020-04-11 ENCOUNTER — Ambulatory Visit: Payer: BC Managed Care – PPO | Admitting: Allergy and Immunology

## 2020-04-11 LAB — CERVICOVAGINAL ANCILLARY ONLY
Chlamydia: NEGATIVE
Comment: NEGATIVE
Comment: NORMAL
Neisseria Gonorrhea: NEGATIVE

## 2020-04-13 ENCOUNTER — Ambulatory Visit (INDEPENDENT_AMBULATORY_CARE_PROVIDER_SITE_OTHER): Payer: 59

## 2020-04-13 DIAGNOSIS — D259 Leiomyoma of uterus, unspecified: Secondary | ICD-10-CM | POA: Diagnosis not present

## 2020-04-13 DIAGNOSIS — R102 Pelvic and perineal pain: Secondary | ICD-10-CM | POA: Diagnosis not present

## 2020-04-13 DIAGNOSIS — D219 Benign neoplasm of connective and other soft tissue, unspecified: Secondary | ICD-10-CM

## 2020-04-13 DIAGNOSIS — N921 Excessive and frequent menstruation with irregular cycle: Secondary | ICD-10-CM

## 2020-04-13 NOTE — Progress Notes (Signed)
PELVIC US TA/TV:heterogeneous anteverted uterus with mult fibroids,(#1) submucosal fibroid mid left uterus 4.2 x 3.9 x 4 cm,(#2) fundal subserosal fibroid 2.9 x 1.7 x 3.9 cm,(#3) mid right intramural fibroid 1.3 x 2 x 1.3 cm,EEC 11.2 mm, endometrium is distorted by submucosal fibroid,normal left ovary,left ovary does not slide,simple right ovarian cyst 3 x 1.9 x 2.7 cm,right ovary slides with probe pressure,no free fluid,left adnexal pain during ultrasound   Chaperone Sabrina

## 2020-04-18 ENCOUNTER — Ambulatory Visit: Payer: 59 | Admitting: Allergy and Immunology

## 2020-04-18 ENCOUNTER — Telehealth: Payer: Self-pay

## 2020-04-18 NOTE — Telephone Encounter (Signed)
Patient came in and talk to [facility] regarding her not feeling well. Patient has yellow nasal discharge & facial pain for 2 days.  Patient started mucinex D today. Patient would like to know what else she should be doing.  Thanks

## 2020-04-18 NOTE — Telephone Encounter (Signed)
Called and spoke to patient and she states she has been using a nasal lavage with no relief. Also, she has had her Covid vaccine. Is there anything else you recommend for patient to take?

## 2020-04-18 NOTE — Telephone Encounter (Signed)
Please have her make sure to do nasal saline washes a few times per day to try and remove any mucus in her airway.  Hopefully this will be short-lived and will not require any additional therapy.  I assume that she has had her Covid vaccinations administered, is that correct

## 2020-04-19 ENCOUNTER — Ambulatory Visit (INDEPENDENT_AMBULATORY_CARE_PROVIDER_SITE_OTHER): Payer: 59

## 2020-04-19 DIAGNOSIS — J4550 Severe persistent asthma, uncomplicated: Secondary | ICD-10-CM | POA: Diagnosis not present

## 2020-04-19 NOTE — Telephone Encounter (Signed)
Spoke with patient and informed her of Dr. Lyndon Code recommendation. Patient verbalized understanding. Patient asked about her allergy injections and I recommended that she hold off until she is feeling better.

## 2020-04-19 NOTE — Telephone Encounter (Signed)
Lets continue nasal saline and other medications for several more days. Most of these infection clear on their own around day 7-10 as most of this infections are viral.

## 2020-04-20 ENCOUNTER — Encounter: Payer: Self-pay | Admitting: Obstetrics & Gynecology

## 2020-04-20 ENCOUNTER — Ambulatory Visit (INDEPENDENT_AMBULATORY_CARE_PROVIDER_SITE_OTHER): Payer: 59 | Admitting: Obstetrics & Gynecology

## 2020-04-20 VITALS — BP 127/84 | HR 89 | Ht 62.0 in | Wt 145.0 lb

## 2020-04-20 DIAGNOSIS — N924 Excessive bleeding in the premenopausal period: Secondary | ICD-10-CM | POA: Diagnosis not present

## 2020-04-20 DIAGNOSIS — R102 Pelvic and perineal pain: Secondary | ICD-10-CM | POA: Diagnosis not present

## 2020-04-20 DIAGNOSIS — D250 Submucous leiomyoma of uterus: Secondary | ICD-10-CM | POA: Diagnosis not present

## 2020-04-20 DIAGNOSIS — D219 Benign neoplasm of connective and other soft tissue, unspecified: Secondary | ICD-10-CM

## 2020-04-20 MED ORDER — MEGESTROL ACETATE 40 MG PO TABS: ORAL_TABLET | ORAL | 0 refills | Status: DC

## 2020-04-20 MED ORDER — PROMETRIUM 200 MG PO CAPS: 200.0000 mg | ORAL_CAPSULE | Freq: Every day | ORAL | 11 refills | Status: DC

## 2020-04-20 NOTE — Progress Notes (Signed)
Follow up appointment for results  Chief Complaint  Patient presents with  . discuss [place] results    Blood pressure 127/84, pulse 89, height 5\' 2"  (1.575 m), weight 145 lb (65.8 kg), last menstrual period 04/18/2020.    GYNECOLOGIC SONOGRAM   Jennifer Mullins is a 49 y.o. G1P0101 Patient's last menstrual period was 03/18/2020. She is here for a pelvic sonogram for menometrorrhagia and fibroids.  Uterus                      7.5 x 5.6 x 7.7 cm, Total uterine volume 169 cc,heterogeneous anteverted uterus with mult fibroids,(#1) submucosal fibroid mid left uterus 4.2 x 3.9 x 4 cm,(#2) fundal subserosal fibroid 2.9 x 1.7 x 3.9 cm,(#3) mid right intramural fibroid 1.3 x 2 x 1.3 cm  Endometrium          11.2 mm, symmetrical, endometrium is distorted by a submucosal fibroid  Right ovary             4.6 x 1.5 x 2.7 cm, simple right ovarian cyst 3 x 1.9 x 2.7 cm,right ovary slides with probe pressure  Left ovary                4 x 1.4 x 2.3 cm, ,normal left ovary,left ovary does not slide  No free fluid   Technician Comments:  PELVIC US TA/TV:heterogeneous anteverted uterus with mult fibroids,(#1) submucosal fibroid mid left uterus 4.2 x 3.9 x 4 cm,(#2) fundal subserosal fibroid 2.9 x 1.7 x 3.9 cm,(#3) mid right intramural fibroid 1.3 x 2 x 1.3 cm,EEC 11.2 mm, endometrium is distorted by a submucosal fibroid,normal left ovary,left ovary does not slide,simple right ovarian cyst 3 x 1.9 x 2.7 cm,right ovary slides with probe pressure,no free fluid,left adnexal pain during ultrasound   Chaperone [address] Heide Guile 04/13/2020 4:55 PM  Clinical Impression and recommendations:  I have reviewed the sonogram results above, combined with the patient's current clinical course, below are my impressions and any appropriate recommendations for management based on the sonographic findings.  Uterus is mildly enlarged due to fibroids, one of which is submucosal Endometrium is  distorted by the submucosal myoma otherwise normal Right ovarian physiologic cyst is present Ovaries otherwise are normal   Florian Buff 04/20/2020 9:55 AM  MEDS ordered this encounter: Meds ordered this encounter  Medications  . megestrol (MEGACE) 40 MG tablet    Sig: 3 tablets a day for 5 days, 2 tablets a day for 5 days then 1 tablet daily    Dispense:  45 tablet    Refill:  0  . PROMETRIUM 200 MG capsule    Sig: Take 1 capsule (200 mg total) by mouth at bedtime.    Dispense:  30 capsule    Refill:  11    Orders for this encounter: No orders of the defined types were placed in this encounter.   Impression:   ICD-10-CM   1. Fibroids  D21.9   2. Pelvic pain  R10.2   3. Abnormal perimenopausal bleeding  N92.4       Plan: >megestrol algorithm x 1 to control current bleeding >off 7 days >begin prometrium DAB for ongoing chronic suppression  Follow Up: Return in about 3 months (around 07/21/2020) for Follow up, with Dr Elonda Husky.       Face to face time:  15 minutes  Greater than 50% of the visit time was spent in counseling and coordination of care  with the patient.  The summary and outline of the counseling and care coordination is summarized in the note above.   All questions were answered.  Past Medical History:  Diagnosis Date  . Anxiety   . Arthritis   . Asthma   . Bilateral calcaneal spurs   . Bilateral polycystic ovarian syndrome   . Bulging lumbar disc   . Carpal tunnel syndrome, bilateral   . COPD (chronic obstructive pulmonary disease) ([facility])    BRONCHITIS  . Diabetes mellitus without complication ([place])    TYPE 2   DX  4-5 YRS AGO  . ETD (Eustachian tube dysfunction), bilateral    left worse than right  . Fibromyalgia   . GAD (generalized anxiety disorder)   . GERD (gastroesophageal reflux disease)    TAKES PRESCRIPTION MEDS  . Glaucoma    BOTH EYES  . Hyperlipidemia   . Hypertension   . Hypothyroidism    NODULES ON THYROID  . PONV  (postoperative nausea and vomiting)   . Pulmonary embolism ([place])   . Tachycardia   . TMJ (dislocation of temporomandibular joint)   . Trigger finger   . Trigger finger of left thumb     Past Surgical History:  Procedure Laterality Date  . ANTERIOR CERVICAL DECOMP/DISCECTOMY FUSION N/A 11/13/2016   Procedure: Cervical five-six, Cervical six-seven Anterior Cervical Discectomy and Fusion, Allograft, Plate;  Surgeon: Marybelle Killings, MD;  Location: [facility];  Service: Orthopedics;  Laterality: N/A;  . BREAST SURGERY    . CARPAL TUNNEL RELEASE    . DILATION AND CURETTAGE OF UTERUS    . EXPLORATORY LAPAROTOMY    . LIPOMA EXCISION Left 06/15/2018   Procedure: LEFT CALF LIPOMA EXCISION;  Surgeon: Marybelle Killings, MD;  Location: [facility];  Service: Orthopedics;  Laterality: Left;  . NASAL SINUS SURGERY  07/22/13   Dr. Redmond Pulling in [place]  . plate and six screws in back    . TRIGGER FINGER RELEASE Right 06/15/2018   Procedure: RIGHT TRIGGER THUMB RELEASE;  Surgeon: Marybelle Killings, MD;  Location: [facility];  Service: Orthopedics;  Laterality: Right;    OB History    Gravida  1   Para  1   Term      Preterm  1   AB      Living  1     SAB      TAB      Ectopic      Multiple      Live Births  1           Allergies  Allergen Reactions  . Demerol [Meperidine] Shortness Of Breath    FLUSHING AND SHORTNESS OF BREATH  . Morphine And Related Shortness Of Breath    Flushed and hot hyper Flushed and hot hyper Flushed and hot hyper Flushed and hot hyper  . Other Shortness Of Breath    Flushed and hot hyper Flushed and hot hyper Flushed and hot hyper Flushed and hot hyper Flushed and hot hyper  . Cardizem  [Diltiazem Hcl] Other (See Comments)  . Diltiazem Hcl Hives  . Lisinopril     Caused cough  . Lyrica [Pregabalin]     Chest pain, elevated heart rate  . Tramadol Other (See Comments)    Tears up stomach    Social History   Socioeconomic  History  . Marital status: Married    Spouse name: Not on file  . Number of children: Not on file  .  Years of education: Not on file  . Highest education level: Not on file  Occupational History  . Occupation: Unemployed  Tobacco Use  . Smoking status: Former Smoker    Packs/day: 0.25    Years: 5.00    Pack years: 1.25    Types: Cigarettes    Quit date: 11/18/1989    Years since quitting: 30.4  . Smokeless tobacco: Never Used  Substance and Sexual Activity  . Alcohol use: No  . Drug use: No  . Sexual activity: Yes    Birth control/protection: None  Other Topics Concern  . Not on file  Social History Narrative  . Not on file   Social Determinants of Health   Financial Resource Strain:   . Difficulty of Paying Living Expenses:   Food Insecurity:   . Worried About Charity fundraiser in the Last Year:   . Arboriculturist in the Last Year:   Transportation Needs:   . Film/video editor (Medical):   Marland Kitchen Lack of Transportation (Non-Medical):   Physical Activity:   . Days of Exercise per Week:   . Minutes of Exercise per Session:   Stress:   . Feeling of Stress :   Social Connections:   . Frequency of Communication with Friends and Family:   . Frequency of Social Gatherings with Friends and Family:   . Attends Religious Services:   . Active Member of Clubs or Organizations:   . Attends Archivist Meetings:   Marland Kitchen Marital Status:     Family History  Problem Relation Age of Onset  . Hypertension Mother   . Hypertension Father   . Cancer Son   . Colon cancer Neg Hx   . Rectal cancer Neg Hx   . Stomach cancer Neg Hx   . Esophageal cancer Neg Hx

## 2020-04-21 ENCOUNTER — Other Ambulatory Visit: Payer: Self-pay | Admitting: Obstetrics & Gynecology

## 2020-04-21 ENCOUNTER — Telehealth: Payer: Self-pay | Admitting: Obstetrics & Gynecology

## 2020-04-21 MED ORDER — PROGESTERONE 200 MG PO CAPS: 200.0000 mg | ORAL_CAPSULE | Freq: Every day | ORAL | 11 refills | Status: DC

## 2020-04-21 NOTE — Telephone Encounter (Signed)
Mel Almond from [facility] wants to know if you can send a new prescription for progesterone for generic in place of Prometrium. That is what her insurance will cover. Thanks!! [place]

## 2020-04-21 NOTE — Telephone Encounter (Signed)
Needs a call back from a nurse in ref to a script

## 2020-04-21 NOTE — Telephone Encounter (Signed)
done

## 2020-04-24 ENCOUNTER — Other Ambulatory Visit: Payer: Self-pay | Admitting: *Deleted

## 2020-04-24 ENCOUNTER — Other Ambulatory Visit: Payer: Self-pay

## 2020-04-24 DIAGNOSIS — L680 Hirsutism: Secondary | ICD-10-CM

## 2020-04-24 MED ORDER — AMOXICILLIN-POT CLAVULANATE 875-125 MG PO TABS: ORAL_TABLET | ORAL | 0 refills | Status: DC

## 2020-04-24 MED ORDER — SPIRONOLACTONE 50 MG PO TABS: 50.0000 mg | ORAL_TABLET | Freq: Two times a day (BID) | ORAL | 2 refills | Status: DC

## 2020-04-24 NOTE — Telephone Encounter (Signed)
Please ask patient if she can tolerate Augmentin.  If so have her use 875 1 tablet twice a day for 14 days.

## 2020-04-24 NOTE — Telephone Encounter (Signed)
Patient called and informed me that she still isn't any better and her head is killing her. She is still blowing out yellow mucus and she feels like she has a sinus infection and is wondering what she can do. Nothing she is doing is helping her. Please advise.

## 2020-04-24 NOTE — Telephone Encounter (Signed)
[name] can tolerate this and I have sent the rx to her pharmacy.

## 2020-04-25 ENCOUNTER — Other Ambulatory Visit: Payer: 59 | Admitting: Orthotics

## 2020-04-25 ENCOUNTER — Telehealth: Payer: Self-pay | Admitting: Obstetrics & Gynecology

## 2020-04-25 NOTE — Telephone Encounter (Signed)
Patient called stating that she was placed on a medication by Dr. Elonda Husky and she has recently seen her primary care and they prescribed her amox /cal 875 mg for her sinus infection. Pt would like to know if this medication will have a reaction to the ones Dr.E rue has prescribed. Please contact pt

## 2020-04-25 NOTE — Telephone Encounter (Signed)
Telephoned patient at home number and advised patient could take antibiotic with Megace and Prometrium. Patient voiced understanding.

## 2020-05-02 ENCOUNTER — Telehealth: Payer: Self-pay | Admitting: Obstetrics & Gynecology

## 2020-05-02 NOTE — Telephone Encounter (Signed)
Pt wants someone to review medication instructions with her. I could not find the medication in the list in chart.

## 2020-05-02 NOTE — Telephone Encounter (Addendum)
Pt called to ask how she takes the Megace. I advised 3 tabs daily x 5 days, 2 tabs daily x 5 days then 1 daily until gone. Will wait 7 days then will start Provera. Pt voiced understanding. [place]

## 2020-05-04 ENCOUNTER — Ambulatory Visit (INDEPENDENT_AMBULATORY_CARE_PROVIDER_SITE_OTHER): Payer: 59

## 2020-05-04 ENCOUNTER — Ambulatory Visit: Payer: 59 | Admitting: Family Medicine

## 2020-05-04 ENCOUNTER — Ambulatory Visit (INDEPENDENT_AMBULATORY_CARE_PROVIDER_SITE_OTHER): Payer: 59 | Admitting: Podiatry

## 2020-05-04 ENCOUNTER — Encounter: Payer: Self-pay | Admitting: Podiatry

## 2020-05-04 ENCOUNTER — Other Ambulatory Visit: Payer: Self-pay

## 2020-05-04 ENCOUNTER — Ambulatory Visit: Payer: 59 | Admitting: Physician Assistant

## 2020-05-04 ENCOUNTER — Ambulatory Visit: Payer: 59 | Admitting: Orthotics

## 2020-05-04 ENCOUNTER — Ambulatory Visit: Payer: 59 | Admitting: Podiatry

## 2020-05-04 DIAGNOSIS — J309 Allergic rhinitis, unspecified: Secondary | ICD-10-CM

## 2020-05-04 DIAGNOSIS — G5782 Other specified mononeuropathies of left lower limb: Secondary | ICD-10-CM

## 2020-05-04 DIAGNOSIS — M66871 Spontaneous rupture of other tendons, right ankle and foot: Secondary | ICD-10-CM | POA: Diagnosis not present

## 2020-05-04 DIAGNOSIS — M722 Plantar fascial fibromatosis: Secondary | ICD-10-CM

## 2020-05-04 DIAGNOSIS — D3610 Benign neoplasm of peripheral nerves and autonomic nervous system, unspecified: Secondary | ICD-10-CM

## 2020-05-04 DIAGNOSIS — G5762 Lesion of plantar nerve, left lower limb: Secondary | ICD-10-CM

## 2020-05-04 MED ORDER — MELOXICAM 15 MG PO TABS: 15.0000 mg | ORAL_TABLET | Freq: Every day | ORAL | 3 refills | Status: DC

## 2020-05-04 NOTE — Progress Notes (Signed)
Patient came in today to pick up custom made foot orthotics.  The goals were accomplished and the patient reported no dissatisfaction with said orthotics.  Patient was advised of breakin period and how to report any issues. 

## 2020-05-05 ENCOUNTER — Telehealth: Payer: Self-pay | Admitting: *Deleted

## 2020-05-05 DIAGNOSIS — M66871 Spontaneous rupture of other tendons, right ankle and foot: Secondary | ICD-10-CM

## 2020-05-05 NOTE — Telephone Encounter (Signed)
Prepared orders to be faxed to [facility] for pre-cert and scheduling, to be faxed once 05/04/2020 clinicals are received.

## 2020-05-05 NOTE — Telephone Encounter (Signed)
-----   Message from Rip Harbour, [facility] sent at 05/04/2020  4:33 PM EDT ----- Regarding: MRI MRI right ankle - evaluate posterior tibial tendon tear right - surgical consideration

## 2020-05-06 NOTE — Progress Notes (Signed)
She presents today for follow-up of her posterior tibial tendinitis right foot.  States that is just killing her.  Is been bothering her consistently.  Is affecting her ability to perform daily duties and walk.  She is also following up for the neuroma third interdigital space of the left foot.  Objective: Evaluation demonstrates pain on palpation to the posterior tibial tendon demonstrates exquisite pain and fluctuance within the tendon sheath distal to the medial malleolus.  She has inversion against resistance which is painful.  And she has some swelling in the posterior compartment of the distal leg.  Palpable neuroma third and fourth interdigital space of the left foot the fourth appears to be worse this time than previously noted.  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  Plan: Requesting MRI of the posterior tibial tendon right ankle starting her back on meloxicam 15 mg 1 p.o. daily 30 in number with 3 refills and I reinjected the fourth interdigital space for the first time today.  I will follow-up with her once the MRI of the right ankle is complete.

## 2020-05-08 ENCOUNTER — Telehealth: Payer: Self-pay | Admitting: Physician Assistant

## 2020-05-08 ENCOUNTER — Other Ambulatory Visit: Payer: Self-pay

## 2020-05-08 NOTE — Telephone Encounter (Signed)
Said lab order was supposed to be sent to [facility] in [place] after last vislt weeks ago but they never got it. Wants call back @ [phone].

## 2020-05-08 NOTE — Telephone Encounter (Signed)
Faxed orders, and clinicals 05/04/2020 - 10/12/2019 and demographics to [place] for pre-cert and scheduling.

## 2020-05-08 NOTE — Addendum Note (Signed)
Addended by: Lennie Odor on: 05/08/2020 02:54 PM   Modules accepted: Orders

## 2020-05-08 NOTE — Telephone Encounter (Signed)
Phone call to patient to inform her that her lab order has been released to [facility] lab.  Patient aware.

## 2020-05-09 LAB — POTASSIUM: Potassium: 4.1 mmol/L (ref 3.5–5.3)

## 2020-05-11 ENCOUNTER — Ambulatory Visit (INDEPENDENT_AMBULATORY_CARE_PROVIDER_SITE_OTHER): Payer: 59

## 2020-05-11 DIAGNOSIS — J309 Allergic rhinitis, unspecified: Secondary | ICD-10-CM

## 2020-05-15 NOTE — Progress Notes (Signed)
VIALS EXP 05-15-21

## 2020-05-16 ENCOUNTER — Telehealth: Payer: Self-pay | Admitting: *Deleted

## 2020-05-16 ENCOUNTER — Ambulatory Visit: Payer: Self-pay

## 2020-05-16 ENCOUNTER — Ambulatory Visit (INDEPENDENT_AMBULATORY_CARE_PROVIDER_SITE_OTHER): Payer: 59 | Admitting: Allergy and Immunology

## 2020-05-16 ENCOUNTER — Other Ambulatory Visit: Payer: Self-pay

## 2020-05-16 ENCOUNTER — Encounter: Payer: Self-pay | Admitting: Allergy and Immunology

## 2020-05-16 VITALS — BP 122/80 | HR 62 | Resp 18 | Ht 62.0 in | Wt 139.8 lb

## 2020-05-16 DIAGNOSIS — J301 Allergic rhinitis due to pollen: Secondary | ICD-10-CM | POA: Diagnosis not present

## 2020-05-16 DIAGNOSIS — J4540 Moderate persistent asthma, uncomplicated: Secondary | ICD-10-CM

## 2020-05-16 DIAGNOSIS — J3089 Other allergic rhinitis: Secondary | ICD-10-CM | POA: Diagnosis not present

## 2020-05-16 DIAGNOSIS — J324 Chronic pansinusitis: Secondary | ICD-10-CM

## 2020-05-16 DIAGNOSIS — K219 Gastro-esophageal reflux disease without esophagitis: Secondary | ICD-10-CM

## 2020-05-16 DIAGNOSIS — L680 Hirsutism: Secondary | ICD-10-CM

## 2020-05-16 DIAGNOSIS — H1010 Acute atopic conjunctivitis, unspecified eye: Secondary | ICD-10-CM

## 2020-05-16 MED ORDER — SPIRONOLACTONE 50 MG PO TABS: 50.0000 mg | ORAL_TABLET | Freq: Two times a day (BID) | ORAL | 1 refills | Status: DC

## 2020-05-16 NOTE — Progress Notes (Signed)
[facility] [place] [facility] [place]   Follow-up Note  Referring Provider: Terald Sleeper, PA-C Primary Provider: Wannetta Sender, FNP Date of Office Visit: 05/16/2020  Subjective:   Jennifer Mullins (DOB: Jul 31, 1971) is a 49 y.o. 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  Her last visit to this clinic was 18 January 2020.  She has been having some problems with persistent ugly nasal discharge from her right nostril more than her left nostril.  She just completed a course of Augmentin which did result in complete elimination of this discharge but unfortunately when she discontinued her Augmentin this discharge did return.  Otherwise her airway is doing quite well and she has not really had a significant issue with asthma that required her to use a bronchodilator more than just 1 or 2 times per week.  She has not required systemic steroid to treat any type of airway issue.  Her reflux is under very good control at this point in time while using her proton pump inhibitor.  Her ear issue tied up with a ETD and tinnitus is really doing quite well at this point in time.  She has been having problems with her immunotherapy.  She has been developing large local reactions when she gets to full dose.  These large local reactions are without any associated systemic or constitutional symptoms but they do last several days and they are somewhat uncomfortable.  Allergies as of 05/16/2020      Reactions   Demerol [meperidine] Shortness Of Breath   FLUSHING AND SHORTNESS OF BREATH   Morphine And Related Shortness Of Breath   Flushed and hot hyper Flushed and hot hyper Flushed and hot hyper Flushed and hot hyper   Other Shortness Of Breath    Flushed and hot hyper Flushed and hot hyper Flushed and hot hyper Flushed and hot hyper Flushed and hot hyper   Cardizem  [diltiazem Hcl] Other (See Comments)   Diltiazem Hcl Hives   Lisinopril    Caused cough   Lyrica [pregabalin]    Chest pain, elevated heart rate   Tramadol Other (See Comments)   Tears up stomach      Medication List      albuterol 108 (90 Base) MCG/ACT inhaler Commonly known as: VENTOLIN HFA INHALE TWO PUFFS INTO THE LUNGS EVERY 6 HOURS AS NEEDED FOR WHEEZING OR SHORTNESS OF BREATH   ALPRAZolam 0.5 MG tablet Commonly known as: XANAX TAKE 1 TABLET BY MOUTH TWICE DAILY AS NEEDED. MAY TAKE ONE MORE TABLET BEFORE FOR ALLERGY SHOTS ONCE A WEEK (30 DAY supply)   amoxicillin-clavulanate 875-125 MG tablet Commonly known as: Augmentin Take one tablet twice daily for 14 days   aspirin 81 MG chewable tablet Chew 81 mg by mouth daily.   Auvi-Q 0.3 mg/0.3 mL Soaj injection Generic drug: EPINEPHrine Use for life threatening allergic reactions   baclofen 10 MG tablet Commonly known as: LIORESAL Take 1 tablet (10 mg total) by mouth 3 (three) times daily as needed.   budesonide-formoterol 80-4.5 MCG/ACT inhaler Commonly known as: Symbicort Inhale 2 puffs into the lungs 2 (two) times daily.   cetirizine 10 MG tablet Commonly known as: ZYRTEC TAKE 1 TABLET BY MOUTH TWICE DAILY   cromolyn 4 % ophthalmic  solution Commonly known as: OPTICROM Place 1 drop into both eyes 4 (four) times daily.   cycloSPORINE 0.05 % ophthalmic emulsion Commonly known as: RESTASIS Place 1 drop into both eyes 2 (two) times daily.   diclofenac Sodium 1 % Gel Commonly known as: VOLTAREN Apply 2 g topically 4 (four) times daily.   Diethylpropion HCl CR 75 MG Tb24 Take 1 tablet (75 mg total) by mouth daily.   fluticasone 50 MCG/ACT nasal spray Commonly known as: FLONASE USE 1-2 SPRAYS IN EACH NOSTRIL THREE TIMES WEEKLY AS NEEDED   HYDROcodone-acetaminophen 5-325 MG  tablet Commonly known as: NORCO/VICODIN Take 1 tablet by mouth every 8 (eight) hours as needed for moderate pain.   linaclotide 290 MCG Caps capsule Commonly known as: Linzess Take 1 capsule (290 mcg total) by mouth daily before breakfast.   losartan 25 MG tablet Commonly known as: COZAAR Take 1 tablet (25 mg total) by mouth daily.   medroxyPROGESTERone 10 MG tablet Commonly known as: PROVERA TAKE 1 TABLET BY MOUTH DAILY   megestrol 40 MG tablet Commonly known as: MEGACE 3 tablets a day for 5 days, 2 tablets a day for 5 days then 1 tablet daily   meloxicam 15 MG tablet Commonly known as: MOBIC Take 1 tablet (15 mg total) by mouth daily.   metFORMIN 500 MG tablet Commonly known as: GLUCOPHAGE TAKE 1 TABLET BY MOUTH EVERY DAY WITH BREAKFAST   montelukast 10 MG tablet Commonly known as: SINGULAIR Take 1 tablet (10 mg total) by mouth at bedtime.   Nucala 100 MG Solr Generic drug: Mepolizumab inject 100 mg under the skin every 4 weeks (to be given by a healthcare provider)   Olopatadine HCl 0.6 % Soln USE TWO SPRAYS IN EACH NOSTRIL TWICE DAILY   pantoprazole 40 MG tablet Commonly known as: PROTONIX Take 40 mg by mouth every morning.   pravastatin 40 MG tablet Commonly known as: PRAVACHOL Take 1 tablet (40 mg total) by mouth daily.   progesterone 200 MG capsule Commonly known as: Prometrium Take 1 capsule (200 mg total) by mouth at bedtime.   spironolactone 50 MG tablet Commonly known as: ALDACTONE Take 1 tablet (50 mg total) by mouth 2 (two) times daily.   UNABLE TO FIND Allergy shots-both arms-weekly   Vaniqa 13.9 % cream Generic drug: Eflornithine HCl APPLY TO AFFECTED AREA TWICE DAILY       Past Medical History:  Diagnosis Date  . Anxiety   . Arthritis   . Asthma   . Bilateral calcaneal spurs   . Bilateral polycystic ovarian syndrome   . Bulging lumbar disc   . Carpal tunnel syndrome, bilateral   . COPD (chronic obstructive pulmonary disease)  (HCC)    BRONCHITIS  . Diabetes mellitus without complication ([place])    TYPE 2   DX  4-5 YRS AGO  . ETD (Eustachian tube dysfunction), bilateral    left worse than right  . Fibromyalgia   . GAD (generalized anxiety disorder)   . GERD (gastroesophageal reflux disease)    TAKES PRESCRIPTION MEDS  . Glaucoma    BOTH EYES  . Hyperlipidemia   . Hypertension   . Hypothyroidism    NODULES ON THYROID  . PONV (postoperative nausea and vomiting)   . Pulmonary embolism ([place])   . Tachycardia   . TMJ (dislocation of temporomandibular joint)   . Trigger finger   . Trigger finger of left thumb     Past Surgical History:  Procedure Laterality Date  . ANTERIOR  CERVICAL DECOMP/DISCECTOMY FUSION N/A 11/13/2016   Procedure: Cervical five-six, Cervical six-seven Anterior Cervical Discectomy and Fusion, Allograft, Plate;  Surgeon: Marybelle Killings, MD;  Location: [facility];  Service: Orthopedics;  Laterality: N/A;  . BREAST SURGERY    . CARPAL TUNNEL RELEASE    . DILATION AND CURETTAGE OF UTERUS    . EXPLORATORY LAPAROTOMY    . LIPOMA EXCISION Left 06/15/2018   Procedure: LEFT CALF LIPOMA EXCISION;  Surgeon: Marybelle Killings, MD;  Location: [facility];  Service: Orthopedics;  Laterality: Left;  . NASAL SINUS SURGERY  07/22/13   Dr. Redmond Pulling in [place]  . plate and six screws in back    . TRIGGER FINGER RELEASE Right 06/15/2018   Procedure: RIGHT TRIGGER THUMB RELEASE;  Surgeon: Marybelle Killings, MD;  Location: [facility];  Service: Orthopedics;  Laterality: Right;    Review of systems negative except as noted in HPI / PMHx or noted below:  Review of Systems  Constitutional: Negative.   HENT: Negative.   Eyes: Negative.   Respiratory: Negative.   Cardiovascular: Negative.   Gastrointestinal: Negative.   Genitourinary: Negative.   Musculoskeletal: Negative.   Skin: Negative.   Neurological: Negative.   Endo/Heme/Allergies: Negative.   Psychiatric/Behavioral: Negative.       Objective:   Vitals:   05/16/20 1615  BP: 122/80  Pulse: 62  Resp: 18  SpO2: 99%   Height: 5\' 2"  (157.5 cm)  Weight: 139 lb 12.8 oz (63.4 kg)   Physical Exam Constitutional:      Appearance: She is not diaphoretic.  HENT:     Head: Normocephalic.     Right Ear: Tympanic membrane, ear canal and external ear normal.     Left Ear: Tympanic membrane, ear canal and external ear normal.     Nose: Nose normal. No mucosal edema or rhinorrhea.     Mouth/Throat:     Pharynx: Uvula midline. No oropharyngeal exudate.  Eyes:     Conjunctiva/sclera: Conjunctivae normal.  Neck:     Thyroid: No thyromegaly.     Trachea: Trachea normal. No tracheal tenderness or tracheal deviation.  Cardiovascular:     Rate and Rhythm: Normal rate and regular rhythm.     Heart sounds: Normal heart sounds, S1 normal and S2 normal. No murmur heard.   Pulmonary:     Effort: No respiratory distress.     Breath sounds: Normal breath sounds. No stridor. No wheezing or rales.  Lymphadenopathy:     Head:     Right side of head: No tonsillar adenopathy.     Left side of head: No tonsillar adenopathy.     Cervical: No cervical adenopathy.  Skin:    Findings: No erythema or rash.     Nails: There is no clubbing.  Neurological:     Mental Status: She is alert.     Diagnostics:    Spirometry was performed and demonstrated an FEV1 of 2.04 at 97 % of predicted.  The patient had an Asthma Control Test with the following results: ACT Total Score: 25.    Assessment and Plan:   1. Asthma, moderate persistent, well-controlled   2. Perennial allergic rhinitis   3. Seasonal allergic rhinitis due to pollen   4. Seasonal allergic conjunctivitis   5. LPRD (laryngopharyngeal reflux disease)   6. Chronic pansinusitis     1. Continue Immunotherapy. Decrease dose to 0.2 red vial. Do not increase past this concentration.  2. Continue to Treat and  prevent inflammation:   A. Flonase- 1 spray each nostril  3-7 times per week  B. Symbicort 80 - 2 inhalations 3-7 times per week  C. montelukast 10 mg - one tablet one time per day  D. Mepolizumab injections   3. Continue to Treat and prevent reflux:   A. pantoprazole 40 mg in a.m.  4. Treat prolonged infection:   A. Augmentin 875 - 1 tablet 2 times a day for 30 days  5. If needed:   A. Patanase 2 sprays each nostril two times per day  B. OTC antihistamine - Zyrtec 10mg  1-2 times per day (dry eye?)  C. Proventil HFA or similar 2 inhalations every 4-6 hours  D. Epi-Pen  6. Continue eye therapy with Dr. Delman Cheadle.  7. Return in 6 months or earlier if problem  [name] may have a component of chronic sinusitis given her purulent nasal discharge that appears to be responsive to the administration of antibiotics and will now treat her with a full 30 days of antibiotics to see if we can clear up this issue.  She will continue on a collection of anti-inflammatory agents for her airway including the use of mepolizumab injections as noted above and will also continue to treat reflux.  We will decrease her maximal dose of full-strength immunotherapy extract of 0.2 mL given her propensity for developing large local reactions with immunotherapy.  Assuming she does well with this plan I will see her back in this clinic in 6 months or earlier if there is a problem.  Allena Katz, MD [facility] / [facility]

## 2020-05-16 NOTE — Telephone Encounter (Signed)
-----   Message from Arlyss Gandy, [place] sent at 05/16/2020 11:17 AM EDT ----- Fine for Spironolactone

## 2020-05-16 NOTE — Patient Instructions (Addendum)
  1. Continue Immunotherapy. Decrease dose to 0.2 red vial. Do not increase past this concentration.  2. Continue to Treat and prevent inflammation:   A. Flonase- 1 spray each nostril 3-7 times per week  B. Symbicort 80 - 2 inhalations 3-7 times per week  C. montelukast 10 mg - one tablet one time per day  D. Mepolizumab injections   3. Continue to Treat and prevent reflux:   A. pantoprazole 40 mg in a.m.  4. Treat prolonged infection:   A. Augmentin 875 - 1 tablet 2 times a day for 30 days  5. If needed:   A. Patanase 2 sprays each nostril two times per day  B. OTC antihistamine - Zyrtec 10mg  1-2 times per day (dry eye?)  C. Proventil HFA or similar 2 inhalations every 4-6 hours  D. Epi-Pen  6. Continue eye therapy with Dr. Delman Cheadle.  7. Return in 6 months or earlier if problem

## 2020-05-16 NOTE — Telephone Encounter (Signed)
Phone call to patient to let her know that Anderson Malta saw her labs and they are ok for the Spironolactone. We sent in refill to the [facility] on [address].

## 2020-05-17 ENCOUNTER — Encounter: Payer: Self-pay | Admitting: Allergy and Immunology

## 2020-05-17 ENCOUNTER — Other Ambulatory Visit: Payer: Self-pay

## 2020-05-17 DIAGNOSIS — J301 Allergic rhinitis due to pollen: Secondary | ICD-10-CM

## 2020-05-17 MED ORDER — AMOXICILLIN-POT CLAVULANATE 875-125 MG PO TABS: 1.0000 | ORAL_TABLET | Freq: Two times a day (BID) | ORAL | 0 refills | Status: AC

## 2020-05-18 ENCOUNTER — Ambulatory Visit (INDEPENDENT_AMBULATORY_CARE_PROVIDER_SITE_OTHER): Payer: 59

## 2020-05-18 ENCOUNTER — Other Ambulatory Visit: Payer: Self-pay

## 2020-05-18 DIAGNOSIS — J3089 Other allergic rhinitis: Secondary | ICD-10-CM | POA: Diagnosis not present

## 2020-05-18 MED ORDER — BUDESONIDE-FORMOTEROL FUMARATE 80-4.5 MCG/ACT IN AERO: 2.0000 | INHALATION_SPRAY | Freq: Every day | RESPIRATORY_TRACT | 5 refills | Status: DC

## 2020-05-18 NOTE — Progress Notes (Signed)
Patient came into the clinic and needed her Symbicort medication to state 2 puff daily.

## 2020-05-25 ENCOUNTER — Other Ambulatory Visit: Payer: Self-pay | Admitting: Podiatry

## 2020-05-27 ENCOUNTER — Ambulatory Visit
Admission: RE | Admit: 2020-05-27 | Discharge: 2020-05-27 | Disposition: A | Discharge status: Home / Self Care | Payer: 59 | Source: Ambulatory Visit | Attending: Podiatry | Admitting: Podiatry

## 2020-05-27 ENCOUNTER — Other Ambulatory Visit: Payer: Self-pay

## 2020-05-29 ENCOUNTER — Telehealth: Payer: Self-pay

## 2020-05-29 MED ORDER — FLUCONAZOLE 150 MG PO TABS: ORAL_TABLET | ORAL | 1 refills | Status: DC

## 2020-05-29 NOTE — Telephone Encounter (Signed)
Please provide patient fluconazole 150 mg tablet-1 tablet today and can repeat in 3 days if needed

## 2020-05-29 NOTE — Telephone Encounter (Signed)
Prescription has been sent and patient aware.

## 2020-05-29 NOTE — Telephone Encounter (Signed)
Patient called and mentioned she was advise by Dr. Neldon Mc to call back and to have Fluconazole called into her [facility] in [place] for yeast infection.Patient states Dr. Neldon Mc normally orders a high dose Rx where she takes. Dr. Neldon Mc please advise on this.

## 2020-06-02 ENCOUNTER — Telehealth: Payer: Self-pay | Admitting: *Deleted

## 2020-06-02 NOTE — Telephone Encounter (Signed)
-----   Message from Garrel Ridgel, [place] sent at 05/31/2020  7:31 AM EDT ----- Please send for an over read and inform patient of the delay.

## 2020-06-02 NOTE — Telephone Encounter (Signed)
Unable to leave a message on pt's home phone no answering. I informed pt of Dr. Stephenie Acres request to send a copy of the MRI to a radiology specialist for more details to plan treatment, there would be a 10-14 day delay in the final results, once received we would call with instructions. Pt states understanding. Faxed request for the MRI disc to [facility].

## 2020-06-06 ENCOUNTER — Telehealth: Payer: Self-pay | Admitting: Obstetrics & Gynecology

## 2020-06-06 NOTE — Telephone Encounter (Signed)
Needs to talk to someone about her meds and bleeding she has done what he told her to do and she needs some advise/ I explained to her that Elonda Husky was out of the office but she still wanted to speak to a nurse today

## 2020-06-06 NOTE — Telephone Encounter (Signed)
Telephoned patient at home number. Patient states started Megace 6/3 and took as directed. Patient believes she did not start back on the correct day. She is currently using the Prometrium at night as prescribed. Still having bleeding. It will stop for 2 days and come back. Patient would like to try Megace again. Advised patient provider was out of the office but will send message. Patient to call back if symptoms get worse.

## 2020-06-06 NOTE — Telephone Encounter (Signed)
Received copy of MRI disc from [facility] yesterday afternoon, mailed to [facility].

## 2020-06-07 ENCOUNTER — Telehealth: Payer: Self-pay | Admitting: Obstetrics & Gynecology

## 2020-06-07 MED ORDER — MEGESTROL ACETATE 40 MG PO TABS: ORAL_TABLET | ORAL | 0 refills | Status: DC

## 2020-06-07 NOTE — Telephone Encounter (Signed)
Patient received call from pharmacy regarding medication. Patient would like someone to give her a call in reference to the medicine. She has questions for the doctor.

## 2020-06-07 NOTE — Telephone Encounter (Signed)
Patient was calling and wanted me to let the nurse know that her antibiotics might be the reason for the bleeding and wants you to give her a call.

## 2020-06-07 NOTE — Telephone Encounter (Signed)
Telephoned patient at home number and patient states was on antibiotic. Pharmacist told her that could cause some irregular bleeding. Patient is spotting right now. Advised patient could wait to start Megace after antibiotic and only take if having bleeding. Patient voiced understanding.

## 2020-06-07 NOTE — Telephone Encounter (Signed)
Another course of megestrol was ordered

## 2020-06-13 ENCOUNTER — Ambulatory Visit (INDEPENDENT_AMBULATORY_CARE_PROVIDER_SITE_OTHER): Payer: 59

## 2020-06-13 ENCOUNTER — Other Ambulatory Visit: Payer: Self-pay

## 2020-06-13 DIAGNOSIS — J4540 Moderate persistent asthma, uncomplicated: Secondary | ICD-10-CM

## 2020-06-13 DIAGNOSIS — J4550 Severe persistent asthma, uncomplicated: Secondary | ICD-10-CM | POA: Diagnosis not present

## 2020-06-16 ENCOUNTER — Ambulatory Visit (INDEPENDENT_AMBULATORY_CARE_PROVIDER_SITE_OTHER): Payer: 59

## 2020-06-16 DIAGNOSIS — J309 Allergic rhinitis, unspecified: Secondary | ICD-10-CM

## 2020-06-19 ENCOUNTER — Other Ambulatory Visit: Payer: Self-pay | Admitting: Physician Assistant

## 2020-06-19 DIAGNOSIS — L680 Hirsutism: Secondary | ICD-10-CM

## 2020-06-19 NOTE — Telephone Encounter (Signed)
She is good. She just had potassium done at the end of June. She wont need again until December.

## 2020-06-19 NOTE — Telephone Encounter (Signed)
Does she need any follow up labs for this refill.

## 2020-06-22 ENCOUNTER — Telehealth: Payer: Self-pay | Admitting: Physician Assistant

## 2020-06-22 ENCOUNTER — Ambulatory Visit (INDEPENDENT_AMBULATORY_CARE_PROVIDER_SITE_OTHER): Payer: 59

## 2020-06-22 DIAGNOSIS — J309 Allergic rhinitis, unspecified: Secondary | ICD-10-CM

## 2020-06-22 NOTE — Telephone Encounter (Signed)
She should have a follow up. So she can have enough RF to get her to a follow up.

## 2020-06-22 NOTE — Telephone Encounter (Signed)
Wants refill for spironolactone 50 mg 2x/day . [place], [place],, [address]. JCB pt. Said request had been sent.

## 2020-06-22 NOTE — Telephone Encounter (Signed)
[facility] didn't have prescription so I gave a verbal order for spir. 50mg  # 60 no refills per Lars Pinks Brun[place].  Patient aware

## 2020-06-27 ENCOUNTER — Telehealth: Payer: Self-pay | Admitting: Podiatry

## 2020-06-27 NOTE — Telephone Encounter (Signed)
Pt called following up on MRI Results. Pt would like to know if [facility] results are back yet.   Please give patient a call.

## 2020-06-30 ENCOUNTER — Ambulatory Visit (INDEPENDENT_AMBULATORY_CARE_PROVIDER_SITE_OTHER): Payer: 59 | Admitting: *Deleted

## 2020-06-30 DIAGNOSIS — J309 Allergic rhinitis, unspecified: Secondary | ICD-10-CM | POA: Diagnosis not present

## 2020-07-04 NOTE — Telephone Encounter (Signed)
No tear of the posterior tibial tendon - send for PT

## 2020-07-04 NOTE — Telephone Encounter (Signed)
Could you please find this for me?

## 2020-07-06 NOTE — Addendum Note (Signed)
Addended by: Clovis Riley E on: 07/06/2020 11:12 AM   Modules accepted: Orders

## 2020-07-06 NOTE — Telephone Encounter (Signed)
Called pt-L/M - informed patient no tear of the tendon, would like for her to go to PT. Asked patient to call back with location preference and I would place the order.

## 2020-07-09 ENCOUNTER — Other Ambulatory Visit: Payer: Self-pay | Admitting: Family Medicine

## 2020-07-10 ENCOUNTER — Ambulatory Visit (INDEPENDENT_AMBULATORY_CARE_PROVIDER_SITE_OTHER): Payer: 59 | Admitting: Physician Assistant

## 2020-07-10 ENCOUNTER — Ambulatory Visit: Payer: Self-pay

## 2020-07-10 ENCOUNTER — Encounter: Payer: Self-pay | Admitting: Physician Assistant

## 2020-07-10 ENCOUNTER — Ambulatory Visit (INDEPENDENT_AMBULATORY_CARE_PROVIDER_SITE_OTHER): Payer: 59

## 2020-07-10 ENCOUNTER — Other Ambulatory Visit: Payer: Self-pay

## 2020-07-10 DIAGNOSIS — L680 Hirsutism: Secondary | ICD-10-CM | POA: Diagnosis not present

## 2020-07-10 DIAGNOSIS — J4540 Moderate persistent asthma, uncomplicated: Secondary | ICD-10-CM

## 2020-07-10 DIAGNOSIS — L639 Alopecia areata, unspecified: Secondary | ICD-10-CM | POA: Diagnosis not present

## 2020-07-10 DIAGNOSIS — L820 Inflamed seborrheic keratosis: Secondary | ICD-10-CM

## 2020-07-10 DIAGNOSIS — J4550 Severe persistent asthma, uncomplicated: Secondary | ICD-10-CM

## 2020-07-10 MED ORDER — CELACYN EX GEL: 1.0000 "application " | Freq: Every morning | CUTANEOUS | 3 refills | Status: DC

## 2020-07-10 MED ORDER — VANIQA 13.9 % EX CREA: TOPICAL_CREAM | CUTANEOUS | 5 refills | Status: DC

## 2020-07-10 NOTE — Progress Notes (Signed)
   Follow up Visit  Subjective  Jennifer Mullins is a 49 y.o. female who presents for the following: Follow-up (4 months- Hirsutism- Better- Tx spironalactone & vaniqa cream). Feels the vaniqa is helping. She is doing well with the spironolactone. She had a bald spot come up on her scalp after her COVID shot but it has grown in again.   Objective  Well appearing patient in no apparent distress; mood and affect are within normal limits.  A focused examination was performed including face, chest, scalp. Relevant physical exam findings are noted in the Assessment and Plan.   Objective  Chest - Medial [facility]), Mid Chin: Excess coarse hair growth  Objective  Left Malar Cheek, Right Malar Cheek: Erythematous stuck-on, waxy papule or plaque.   Objective  Left Frontal Scalp: New hair growth noted over entire spot  Assessment & Plan  Hirsutism (2) Chest - Medial (Center); Mid Chin  Reordered Medications VANIQA 13.9 % cream  Other Related Medications spironolactone (ALDACTONE) 50 MG tablet  Inflamed seborrheic keratosis (2) Left Malar Cheek; Right Malar Cheek  Destruction of lesion - Left Malar Cheek, Right Malar Cheek Complexity: simple   Destruction method: cryotherapy   Informed consent: discussed and consent obtained   Timeout:  patient name, date of birth, surgical site, and procedure verified Lesion destroyed using liquid nitrogen: Yes   Outcome: patient tolerated procedure well with no complications   Additional details:  Done with a swab   Alopecia areata Left Frontal Scalp

## 2020-07-11 ENCOUNTER — Ambulatory Visit: Payer: Self-pay

## 2020-07-12 ENCOUNTER — Telehealth: Payer: Self-pay | Admitting: Physician Assistant

## 2020-07-12 ENCOUNTER — Encounter: Payer: Self-pay | Admitting: *Deleted

## 2020-07-12 NOTE — Telephone Encounter (Signed)
One

## 2020-07-12 NOTE — Telephone Encounter (Signed)
Question about skin and nail vitamin strength that she takes She uses extra strength hair skin and nails, 5000 mcg biotin per serving. Wants to know how many to take a day; she's currently taking 2. OK to leave messag if she's not in.Marland Kitchen

## 2020-07-13 ENCOUNTER — Ambulatory Visit (INDEPENDENT_AMBULATORY_CARE_PROVIDER_SITE_OTHER): Payer: 59 | Admitting: *Deleted

## 2020-07-13 DIAGNOSIS — J309 Allergic rhinitis, unspecified: Secondary | ICD-10-CM

## 2020-07-19 ENCOUNTER — Other Ambulatory Visit: Payer: Self-pay | Admitting: Physician Assistant

## 2020-07-19 DIAGNOSIS — L680 Hirsutism: Secondary | ICD-10-CM

## 2020-07-21 ENCOUNTER — Other Ambulatory Visit: Payer: Self-pay

## 2020-07-25 ENCOUNTER — Ambulatory Visit: Payer: 59 | Admitting: Obstetrics & Gynecology

## 2020-07-26 ENCOUNTER — Other Ambulatory Visit: Payer: Self-pay | Admitting: Obstetrics & Gynecology

## 2020-07-28 ENCOUNTER — Ambulatory Visit (INDEPENDENT_AMBULATORY_CARE_PROVIDER_SITE_OTHER): Payer: 59

## 2020-07-28 DIAGNOSIS — J309 Allergic rhinitis, unspecified: Secondary | ICD-10-CM

## 2020-07-31 ENCOUNTER — Telehealth: Payer: Self-pay

## 2020-07-31 DIAGNOSIS — J329 Chronic sinusitis, unspecified: Secondary | ICD-10-CM

## 2020-07-31 NOTE — Telephone Encounter (Signed)
Patient is unhappy with this response. She says that she is in severe pain and her face is hurting and feels like it got hit. She is using a navage, netti pot and nasal rinse. She said that she thinks it is time for the further evaluation and treatment because antibiotics did not work the last time she had something this bad. Patient said that she received 0.3cc of her injections by mistake a couple of weeks ago and developed a headache for a couple of days afterwards then that got better. She said that after receiving her injection 07-28-20 of 0.2 cc which is her correct dose she developed all of these symptoms. Patient says that the stuff coming out of her nose is dark yellow, dark green and some of it is very hard. She says that she is hurting very badly. I went over in detail for 13 minutes about her medication list and her recommendations that you gave last visit when she had similar issues. Patient wants to see ENT for evaluation. I did let her know that if she is already an established patient she should be able to just call and schedule and appointment. I also let her know that if she wishes she can go see urgent care for more immediate care. I did let her know that at this time I can only relay the message from Dr. Neldon Mc and go over her current medications. I told her that I am going to send this back to Dr. Neldon Mc so that he is aware of the situation and what her wishes are.

## 2020-07-31 NOTE — Telephone Encounter (Signed)
Please Jennifer Mullins patient that she should use conservative therapy during the early part of these sinus episodes and attempt to clear this without the use of an antibiotic by using lots of nasal saline every day and continuing on all of her other medications.  If this persists past 10 to 14 days then there is probably a need to have her undergo further evaluation and treatment.

## 2020-07-31 NOTE — Telephone Encounter (Signed)
Have Jennifer Mullins obtain a sinus CT scan tomorrow.

## 2020-07-31 NOTE — Telephone Encounter (Signed)
Patient called with complaints of severe head congestion, severe sinus headache and green-yellow nasal discharge. She is using the nasal rinses and a vaporizer as instructed previously. She does think it is a sinus infection and would like to know what you would like for her to do.

## 2020-07-31 NOTE — Telephone Encounter (Signed)
Patient will need a pre-certification.

## 2020-08-01 ENCOUNTER — Telehealth: Payer: Self-pay | Admitting: Allergy and Immunology

## 2020-08-01 ENCOUNTER — Ambulatory Visit
Admission: EM | Admit: 2020-08-01 | Discharge: 2020-08-01 | Disposition: A | Discharge status: Home / Self Care | Payer: 59 | Attending: Emergency Medicine | Admitting: Emergency Medicine

## 2020-08-01 DIAGNOSIS — J019 Acute sinusitis, unspecified: Secondary | ICD-10-CM

## 2020-08-01 DIAGNOSIS — R0981 Nasal congestion: Secondary | ICD-10-CM

## 2020-08-01 MED ORDER — AMOXICILLIN-POT CLAVULANATE 875-125 MG PO TABS: 1.0000 | ORAL_TABLET | Freq: Two times a day (BID) | ORAL | 0 refills | Status: DC

## 2020-08-01 MED ORDER — PREDNISONE 20 MG PO TABS: 20.0000 mg | ORAL_TABLET | Freq: Two times a day (BID) | ORAL | 0 refills | Status: AC

## 2020-08-01 MED ORDER — DOXYCYCLINE HYCLATE 100 MG PO CAPS: 100.0000 mg | ORAL_CAPSULE | Freq: Two times a day (BID) | ORAL | 0 refills | Status: DC

## 2020-08-01 NOTE — Telephone Encounter (Signed)
Patient was seen at urgent care and given prednisone as well as antibiotics. I did let her know about the need for the ct scan.

## 2020-08-01 NOTE — Discharge Instructions (Signed)
Get plenty of rest and push fluids Augmentin prescribed for sinus infection Prednisone prescribed for sinus inflammation and congestion Use OTC zyrtec for nasal congestion, runny nose, and/or sore throat Use OTC flonase for nasal congestion and runny nose Use medications daily for symptom relief Use OTC medications like ibuprofen or tylenol as needed fever or pain Follow up with allergist Call or go to the ED if you have any new or worsening symptoms such as fever, cough, shortness of breath, chest tightness, chest pain, turning blue, changes in mental status, etc..Marland Kitchen

## 2020-08-01 NOTE — Addendum Note (Signed)
Addended by: Lucrezia Starch I on: 08/01/2020 12:37 PM   Modules accepted: Orders

## 2020-08-01 NOTE — Telephone Encounter (Signed)
Patient is also seeing Dr. Benjamine Mola next week.

## 2020-08-01 NOTE — ED Triage Notes (Signed)
Pt presents with nasal congestion and headache that has been ongoing since end of August. Pt states that she was inadvertently given larger dose of immune injection

## 2020-08-01 NOTE — Telephone Encounter (Signed)
Noted  

## 2020-08-01 NOTE — ED Provider Notes (Signed)
[facility]   [id number] 08/01/20 Arrival Time: 2831   CC: Sinus congestion  SUBJECTIVE: History from: patient.  Jennifer Mullins is a 49 y.o. female who presents with sinus pain/ pressure, nasal congestion and sinus HA x 2 weeks.  Denies sick exposure to COVID, flu or strep.   Has tried OTC medications without relief.  Denies aggravating factors.  Reports previous symptoms in the past.   Denies fever, chills, SOB, wheezing, chest pain, nausea, changes in bowel or bladder habits.    ROS: As per HPI.  All other pertinent ROS negative.     Past Medical History:  Diagnosis Date  . Anxiety   . Arthritis   . Asthma   . Bilateral calcaneal spurs   . Bilateral polycystic ovarian syndrome   . Bulging lumbar disc   . Carpal tunnel syndrome, bilateral   . COPD (chronic obstructive pulmonary disease) (HCC)    BRONCHITIS  . Diabetes mellitus without complication ([place])    TYPE 2   DX  4-5 YRS AGO  . ETD (Eustachian tube dysfunction), bilateral    left worse than right  . Fibromyalgia   . GAD (generalized anxiety disorder)   . GERD (gastroesophageal reflux disease)    TAKES PRESCRIPTION MEDS  . Glaucoma    BOTH EYES  . Hyperlipidemia   . Hypertension   . Hypothyroidism    NODULES ON THYROID  . PONV (postoperative nausea and vomiting)   . Pulmonary embolism ([place])   . Tachycardia   . TMJ (dislocation of temporomandibular joint)   . Trigger finger   . Trigger finger of left thumb    Past Surgical History:  Procedure Laterality Date  . ANTERIOR CERVICAL DECOMP/DISCECTOMY FUSION N/A 11/13/2016   Procedure: Cervical five-six, Cervical six-seven Anterior Cervical Discectomy and Fusion, Allograft, Plate;  Surgeon: Marybelle Killings, MD;  Location: [facility];  Service: Orthopedics;  Laterality: N/A;  . BREAST SURGERY    . CARPAL TUNNEL RELEASE    . DILATION AND CURETTAGE OF UTERUS    . EXPLORATORY LAPAROTOMY    . LIPOMA EXCISION Left 06/15/2018   Procedure: LEFT CALF LIPOMA EXCISION;   Surgeon: Marybelle Killings, MD;  Location: [facility];  Service: Orthopedics;  Laterality: Left;  . NASAL SINUS SURGERY  07/22/13   Dr. Redmond Pulling in [place]  . plate and six screws in back    . TRIGGER FINGER RELEASE Right 06/15/2018   Procedure: RIGHT TRIGGER THUMB RELEASE;  Surgeon: Marybelle Killings, MD;  Location: [facility];  Service: Orthopedics;  Laterality: Right;   Allergies  Allergen Reactions  . Demerol [Meperidine] Shortness Of Breath    FLUSHING AND SHORTNESS OF BREATH  . Morphine And Related Shortness Of Breath    Flushed and hot hyper Flushed and hot hyper Flushed and hot hyper Flushed and hot hyper  . Other Shortness Of Breath    Flushed and hot hyper Flushed and hot hyper Flushed and hot hyper Flushed and hot hyper Flushed and hot hyper  . Cardizem  [Diltiazem Hcl] Other (See Comments)  . Diltiazem Hcl Hives  . Lisinopril     Caused cough  . Lyrica [Pregabalin]     Chest pain, elevated heart rate  . Tramadol Other (See Comments)    Tears up stomach   Current Facility-Administered Medications on File Prior to Encounter  Medication Dose Route Frequency Provider Last Rate Last Admin  . Mepolizumab SOLR 100 mg  100 mg Subcutaneous Q28 days Kozlow,  Donnamarie Poag, MD   100 mg at 07/10/20 1336   Current Outpatient Medications on File Prior to Encounter  Medication Sig Dispense Refill  . albuterol (VENTOLIN HFA) 108 (90 Base) MCG/ACT inhaler INHALE TWO PUFFS INTO THE LUNGS EVERY 6 HOURS AS NEEDED FOR WHEEZING OR SHORTNESS OF BREATH 8.5 g 5  . ALPRAZolam (XANAX) 0.5 MG tablet TAKE 1 TABLET BY MOUTH TWICE DAILY AS NEEDED. MAY TAKE ONE MORE TABLET BEFORE FOR ALLERGY SHOTS ONCE A WEEK (30 DAY supply) 65 tablet 5  . aspirin 81 MG chewable tablet Chew 81 mg by mouth daily.    Marland Kitchen AUVI-Q 0.3 MG/0.3ML SOAJ injection Use for life threatening allergic reactions 4 each 3  . baclofen (LIORESAL) 10 MG tablet Take 1 tablet (10 mg total) by mouth 3 (three) times daily as  needed. 90 tablet 5  . budesonide-formoterol (SYMBICORT) 80-4.5 MCG/ACT inhaler Inhale 2 puffs into the lungs daily. 1 Inhaler 5  . cetirizine (ZYRTEC) 10 MG tablet TAKE 1 TABLET BY MOUTH TWICE DAILY 60 tablet 1  . cromolyn (OPTICROM) 4 % ophthalmic solution Place 1 drop into both eyes 4 (four) times daily.   1  . cycloSPORINE (RESTASIS) 0.05 % ophthalmic emulsion Place 1 drop into both eyes 2 (two) times daily.    . diclofenac Sodium (VOLTAREN) 1 % GEL Apply 2 g topically 4 (four) times daily.    . Diethylpropion HCl CR 75 MG TB24 Take 1 tablet (75 mg total) by mouth daily. 30 tablet 5  . fluconazole (DIFLUCAN) 150 MG tablet 1 tablet by mouth once. May repeat 1 more time in 3 days. (Patient not taking: Reported on 07/10/2020) 2 tablet 1  . fluticasone (FLONASE) 50 MCG/ACT nasal spray USE 1-2 SPRAYS IN EACH NOSTRIL THREE TIMES WEEKLY AS NEEDED 16 g 5  . HYDROcodone-acetaminophen (NORCO/VICODIN) 5-325 MG tablet Take 1 tablet by mouth every 8 (eight) hours as needed for moderate pain.    Marland Kitchen linaclotide (LINZESS) 290 MCG CAPS capsule Take 1 capsule (290 mcg total) by mouth daily before breakfast. 30 capsule 5  . losartan (COZAAR) 25 MG tablet Take 1 tablet (25 mg total) by mouth daily. 90 tablet 3  . megestrol (MEGACE) 40 MG tablet TAKE 3 TABLETS DAILY BY MOUTH FOR 5 DAYS, 2 TABLETS DAILY FOR 5 DAYS, THEN 1 TABLET DAILY 45 tablet 4  . metFORMIN (GLUCOPHAGE) 500 MG tablet TAKE 1 TABLET BY MOUTH EVERY DAY WITH BREAKFAST 90 tablet 3  . montelukast (SINGULAIR) 10 MG tablet Take 1 tablet (10 mg total) by mouth at bedtime. 90 tablet 3  . NUCALA 100 MG SOLR inject 100 mg under the skin every 4 weeks (to be given by a healthcare provider) 1 each 10  . Olopatadine HCl 0.6 % SOLN USE TWO SPRAYS IN EACH NOSTRIL TWICE DAILY 30.5 g 4  . pantoprazole (PROTONIX) 40 MG tablet Take 40 mg by mouth every morning.    . pravastatin (PRAVACHOL) 40 MG tablet Take 1 tablet (40 mg total) by mouth daily. 90 tablet 1  .  progesterone (PROMETRIUM) 200 MG capsule Take 1 capsule (200 mg total) by mouth at bedtime. 30 capsule 11  . Scar Treatment Products (CELACYN) GEL Apply 1 application topically every morning. 28 g 3  . spironolactone (ALDACTONE) 50 MG tablet TAKE 1 TABLET BY MOUTH TWICE DAILY 60 tablet 1  . UNABLE TO FIND Allergy shots-both arms-weekly    . VANIQA 13.9 % cream APPLY TO AFFECTED AREA TWICE DAILY 45 g 5  Social History   Socioeconomic History  . Marital status: Married    Spouse name: Not on file  . Number of children: Not on file  . Years of education: Not on file  . Highest education level: Not on file  Occupational History  . Occupation: Unemployed  Tobacco Use  . Smoking status: Former Smoker    Packs/day: 0.25    Years: 5.00    Pack years: 1.25    Types: Cigarettes    Quit date: 11/18/1989    Years since quitting: 30.7  . Smokeless tobacco: Never Used  Vaping Use  . Vaping Use: Never used  Substance and Sexual Activity  . Alcohol use: No  . Drug use: No  . Sexual activity: Yes    Birth control/protection: None  Other Topics Concern  . Not on file  Social History Narrative  . Not on file   Social Determinants of Health   Financial Resource Strain:   . Difficulty of Paying Living Expenses: Not on file  Food Insecurity:   . Worried About Charity fundraiser in the Last Year: Not on file  . Ran Out of Food in the Last Year: Not on file  Transportation Needs:   . Lack of Transportation (Medical): Not on file  . Lack of Transportation (Non-Medical): Not on file  Physical Activity:   . Days of Exercise per Week: Not on file  . Minutes of Exercise per Session: Not on file  Stress:   . Feeling of Stress : Not on file  Social Connections:   . Frequency of Communication with Friends and Family: Not on file  . Frequency of Social Gatherings with Friends and Family: Not on file  . Attends Religious Services: Not on file  . Active Member of Clubs or Organizations: Not on  file  . Attends Archivist Meetings: Not on file  . Marital Status: Not on file  Intimate Partner Violence:   . Fear of Current or Ex-Partner: Not on file  . Emotionally Abused: Not on file  . Physically Abused: Not on file  . Sexually Abused: Not on file   Family History  Problem Relation Age of Onset  . Hypertension Mother   . Hypertension Father   . Cancer Son   . Colon cancer Neg Hx   . Rectal cancer Neg Hx   . Stomach cancer Neg Hx   . Esophageal cancer Neg Hx     OBJECTIVE:  Vitals:   08/01/20 0858  BP: 116/63  Pulse: 98  Resp: 20  Temp: 98.9 F (37.2 C)  SpO2: 97%     General appearance: alert; appears fatigued, but nontoxic; speaking in full sentences and tolerating own secretions HEENT: NCAT; Ears: EACs clear, TMs pearly gray; Eyes: PERRL.  EOM grossly intact. Sinuses: TTP frontal and maxillary sinuses; Nose: nares patent without rhinorrhea, Throat: oropharynx clear, tonsils non erythematous or enlarged, uvula midline  Neck: supple without LAD Lungs: unlabored respirations, symmetrical air entry; cough: absent; no respiratory distress; CTAB Heart: regular rate and rhythm.   Skin: warm and dry Psychological: alert and cooperative; normal mood and affect   ASSESSMENT & PLAN:  1. Acute non-recurrent sinusitis, unspecified location   2. Nasal congestion     Meds ordered this encounter  Medications  . amoxicillin-clavulanate (AUGMENTIN) 875-125 MG tablet    Sig: Take 1 tablet by mouth every 12 (twelve) hours for 10 days.    Dispense:  20 tablet    Refill:  0  Order Specific Question:   Supervising Provider    Answer:   Raylene Everts [[id number]]  . predniSONE (DELTASONE) 20 MG tablet    Sig: Take 1 tablet (20 mg total) by mouth 2 (two) times daily with a meal for 5 days.    Dispense:  10 tablet    Refill:  0    Order Specific Question:   Supervising Provider    Answer:   Raylene Everts [[id number]]    Get plenty of rest and push  fluids Augmentin prescribed for sinus infection Prednisone prescribed for sinus inflammation and congestion Use OTC zyrtec for nasal congestion, runny nose, and/or sore throat Use OTC flonase for nasal congestion and runny nose Use medications daily for symptom relief Use OTC medications like ibuprofen or tylenol as needed fever or pain Follow up with allergist Call or go to the ED if you have any new or worsening symptoms such as fever, cough, shortness of breath, chest tightness, chest pain, turning blue, changes in mental status, etc...   Reviewed expectations re: course of current medical issues. Questions answered. Outlined signs and symptoms indicating need for more acute intervention. Patient verbalized understanding. After Visit Summary given.         Lestine Box, PA-C 08/01/20 [phone]

## 2020-08-01 NOTE — Telephone Encounter (Signed)
Patient called and would like for Kayla to call her. She said that she went to the uregent care this morning.  [phone].

## 2020-08-04 ENCOUNTER — Other Ambulatory Visit: Payer: Self-pay | Admitting: *Deleted

## 2020-08-04 MED ORDER — CETIRIZINE HCL 10 MG PO TABS
10.0000 mg | ORAL_TABLET | Freq: Two times a day (BID) | ORAL | 5 refills | Status: DC
Start: 2020-08-04 — End: 2021-02-08

## 2020-08-07 ENCOUNTER — Other Ambulatory Visit: Payer: Self-pay

## 2020-08-07 ENCOUNTER — Ambulatory Visit (INDEPENDENT_AMBULATORY_CARE_PROVIDER_SITE_OTHER): Payer: 59

## 2020-08-07 DIAGNOSIS — J4540 Moderate persistent asthma, uncomplicated: Secondary | ICD-10-CM

## 2020-08-07 NOTE — Telephone Encounter (Signed)
Patient just informed me that Dr. Benjamine Mola has ordered the same imaging study and they are currently working on a precert.

## 2020-08-07 NOTE — Telephone Encounter (Signed)
FYI

## 2020-08-07 NOTE — Telephone Encounter (Signed)
Pre certification form faxed on 08-01-20 & 08-07-20.

## 2020-08-10 ENCOUNTER — Other Ambulatory Visit (HOSPITAL_COMMUNITY): Payer: Self-pay | Admitting: Otolaryngology

## 2020-08-10 ENCOUNTER — Other Ambulatory Visit: Payer: Self-pay | Admitting: Otolaryngology

## 2020-08-10 DIAGNOSIS — J320 Chronic maxillary sinusitis: Secondary | ICD-10-CM

## 2020-08-10 NOTE — Telephone Encounter (Signed)
I spoke with Dr. Deeann Saint office and they did see Jennifer Mullins on 08/04/2020. Dr. Benjamine Mola did order a ct scan and they will send [place] the results once the imaging study has been performed.

## 2020-08-21 ENCOUNTER — Ambulatory Visit: Payer: 59 | Admitting: Obstetrics & Gynecology

## 2020-08-22 ENCOUNTER — Ambulatory Visit (INDEPENDENT_AMBULATORY_CARE_PROVIDER_SITE_OTHER): Payer: 59 | Admitting: *Deleted

## 2020-08-22 DIAGNOSIS — J309 Allergic rhinitis, unspecified: Secondary | ICD-10-CM | POA: Diagnosis not present

## 2020-08-25 ENCOUNTER — Encounter (HOSPITAL_COMMUNITY): Payer: Self-pay

## 2020-08-25 ENCOUNTER — Ambulatory Visit (HOSPITAL_COMMUNITY): Payer: 59

## 2020-08-28 ENCOUNTER — Encounter: Payer: Self-pay | Admitting: Obstetrics & Gynecology

## 2020-08-28 ENCOUNTER — Ambulatory Visit (INDEPENDENT_AMBULATORY_CARE_PROVIDER_SITE_OTHER): Payer: 59 | Admitting: Obstetrics & Gynecology

## 2020-08-28 VITALS — BP 116/78 | HR 83 | Ht 62.0 in | Wt 133.5 lb

## 2020-08-28 DIAGNOSIS — N924 Excessive bleeding in the premenopausal period: Secondary | ICD-10-CM | POA: Diagnosis not present

## 2020-08-28 NOTE — Progress Notes (Signed)
Chief Complaint  Patient presents with  . Follow-up      49 y.o. G1P0101 No LMP recorded. Patient is perimenopausal. The current method of family planning is peri menopausal.  Outpatient Encounter Medications as of 08/28/2020  Medication Sig  . albuterol (VENTOLIN HFA) 108 (90 Base) MCG/ACT inhaler INHALE TWO PUFFS INTO THE LUNGS EVERY 6 HOURS AS NEEDED FOR WHEEZING OR SHORTNESS OF BREATH  . ALPRAZolam (XANAX) 0.5 MG tablet TAKE 1 TABLET BY MOUTH TWICE DAILY AS NEEDED. MAY TAKE ONE MORE TABLET BEFORE FOR ALLERGY SHOTS ONCE A WEEK (30 DAY supply)  . aspirin 81 MG chewable tablet Chew 81 mg by mouth daily.  Marland Kitchen AUVI-Q 0.3 MG/0.3ML SOAJ injection Use for life threatening allergic reactions  . budesonide-formoterol (SYMBICORT) 80-4.5 MCG/ACT inhaler Inhale 2 puffs into the lungs daily.  . cetirizine (ZYRTEC) 10 MG tablet Take 1 tablet (10 mg total) by mouth 2 (two) times daily.  . cromolyn (OPTICROM) 4 % ophthalmic solution Place 1 drop into both eyes 4 (four) times daily.   . cycloSPORINE (RESTASIS) 0.05 % ophthalmic emulsion Place 1 drop into both eyes 2 (two) times daily.  . diclofenac Sodium (VOLTAREN) 1 % GEL Apply 2 g topically 4 (four) times daily.  . Diethylpropion HCl CR 75 MG TB24 Take 1 tablet (75 mg total) by mouth daily.  . fluticasone (FLONASE) 50 MCG/ACT nasal spray USE 1-2 SPRAYS IN EACH NOSTRIL THREE TIMES WEEKLY AS NEEDED  . HYDROcodone-acetaminophen (NORCO/VICODIN) 5-325 MG tablet Take 1 tablet by mouth every 8 (eight) hours as needed for moderate pain.  Marland Kitchen linaclotide (LINZESS) 290 MCG CAPS capsule Take 1 capsule (290 mcg total) by mouth daily before breakfast.  . losartan (COZAAR) 25 MG tablet Take 1 tablet (25 mg total) by mouth daily.  . megestrol (MEGACE) 40 MG tablet TAKE 3 TABLETS DAILY BY MOUTH FOR 5 DAYS, 2 TABLETS DAILY FOR 5 DAYS, THEN 1 TABLET DAILY  . metFORMIN (GLUCOPHAGE) 500 MG tablet TAKE 1 TABLET BY MOUTH EVERY DAY WITH BREAKFAST  . montelukast  (SINGULAIR) 10 MG tablet Take 1 tablet (10 mg total) by mouth at bedtime.  Marland Kitchen NUCALA 100 MG SOLR inject 100 mg under the skin every 4 weeks (to be given by a healthcare provider)  . Olopatadine HCl 0.6 % SOLN USE TWO SPRAYS IN EACH NOSTRIL TWICE DAILY  . pantoprazole (PROTONIX) 40 MG tablet Take 40 mg by mouth every morning.  . pravastatin (PRAVACHOL) 40 MG tablet Take 1 tablet (40 mg total) by mouth daily.  . progesterone (PROMETRIUM) 200 MG capsule Take 1 capsule (200 mg total) by mouth at bedtime.  . Scar Treatment Products (CELACYN) GEL Apply 1 application topically every morning.  Marland Kitchen spironolactone (ALDACTONE) 50 MG tablet TAKE 1 TABLET BY MOUTH TWICE DAILY  . UNABLE TO FIND Allergy shots-both arms-weekly  . VANIQA 13.9 % cream APPLY TO AFFECTED AREA TWICE DAILY  . [DISCONTINUED] baclofen (LIORESAL) 10 MG tablet Take 1 tablet (10 mg total) by mouth 3 (three) times daily as needed.  . [DISCONTINUED] doxycycline (VIBRAMYCIN) 100 MG capsule Take 1 capsule (100 mg total) by mouth 2 (two) times daily.  . [DISCONTINUED] fluconazole (DIFLUCAN) 150 MG tablet 1 tablet by mouth once. May repeat 1 more time in 3 days. (Patient not taking: Reported on 07/10/2020)   Facility-Administered Encounter Medications as of 08/28/2020  Medication  . Mepolizumab SOLR 100 mg    Subjective Pt has responded well to the megestrol and prometrium  Stopped now  for >1 month Will now start to do a suppressive cycling course Past Medical History:  Diagnosis Date  . Anxiety   . Arthritis   . Asthma   . Bilateral calcaneal spurs   . Bilateral polycystic ovarian syndrome   . Bulging lumbar disc   . Carpal tunnel syndrome, bilateral   . COPD (chronic obstructive pulmonary disease) (HCC)    BRONCHITIS  . Diabetes mellitus without complication ([place])    TYPE 2   DX  4-5 YRS AGO  . ETD (Eustachian tube dysfunction), bilateral    left worse than right  . Fibromyalgia   . GAD (generalized anxiety disorder)   . GERD  (gastroesophageal reflux disease)    TAKES PRESCRIPTION MEDS  . Glaucoma    BOTH EYES  . Hyperlipidemia   . Hypertension   . Hypothyroidism    NODULES ON THYROID  . PONV (postoperative nausea and vomiting)   . Pulmonary embolism ([place])   . Tachycardia   . TMJ (dislocation of temporomandibular joint)   . Trigger finger   . Trigger finger of left thumb     Past Surgical History:  Procedure Laterality Date  . ANTERIOR CERVICAL DECOMP/DISCECTOMY FUSION N/A 11/13/2016   Procedure: Cervical five-six, Cervical six-seven Anterior Cervical Discectomy and Fusion, Allograft, Plate;  Surgeon: Marybelle Killings, MD;  Location: [facility];  Service: Orthopedics;  Laterality: N/A;  . BREAST SURGERY    . CARPAL TUNNEL RELEASE    . DILATION AND CURETTAGE OF UTERUS    . EXPLORATORY LAPAROTOMY    . LIPOMA EXCISION Left 06/15/2018   Procedure: LEFT CALF LIPOMA EXCISION;  Surgeon: Marybelle Killings, MD;  Location: [facility];  Service: Orthopedics;  Laterality: Left;  . NASAL SINUS SURGERY  07/22/13   Dr. Redmond Pulling in [place]  . plate and six screws in back    . TRIGGER FINGER RELEASE Right 06/15/2018   Procedure: RIGHT TRIGGER THUMB RELEASE;  Surgeon: Marybelle Killings, MD;  Location: [facility];  Service: Orthopedics;  Laterality: Right;    OB History    Gravida  1   Para  1   Term      Preterm  1   AB      Living  1     SAB      TAB      Ectopic      Multiple      Live Births  1           Allergies  Allergen Reactions  . Demerol [Meperidine] Shortness Of Breath    FLUSHING AND SHORTNESS OF BREATH  . Morphine And Related Shortness Of Breath    Flushed and hot hyper Flushed and hot hyper Flushed and hot hyper Flushed and hot hyper  . Other Shortness Of Breath    Flushed and hot hyper Flushed and hot hyper Flushed and hot hyper Flushed and hot hyper Flushed and hot hyper  . Cardizem  [Diltiazem Hcl] Other (See Comments)  . Diltiazem Hcl Hives  . Lisinopril       Caused cough  . Lyrica [Pregabalin]     Chest pain, elevated heart rate  . Tramadol Other (See Comments)    Tears up stomach    Social History   Socioeconomic History  . Marital status: Married    Spouse name: Not on file  . Number of children: Not on file  . Years of education: Not on file  . Highest education level: Not  on file  Occupational History  . Occupation: Unemployed  Tobacco Use  . Smoking status: Former Smoker    Packs/day: 0.25    Years: 5.00    Pack years: 1.25    Types: Cigarettes    Quit date: 11/18/1989    Years since quitting: 30.7  . Smokeless tobacco: Never Used  Vaping Use  . Vaping Use: Never used  Substance and Sexual Activity  . Alcohol use: No  . Drug use: No  . Sexual activity: Yes    Birth control/protection: None  Other Topics Concern  . Not on file  Social History Narrative  . Not on file   Social Determinants of Health   Financial Resource Strain:   . Difficulty of Paying Living Expenses: Not on file  Food Insecurity:   . Worried About Charity fundraiser in the Last Year: Not on file  . Ran Out of Food in the Last Year: Not on file  Transportation Needs:   . Lack of Transportation (Medical): Not on file  . Lack of Transportation (Non-Medical): Not on file  Physical Activity:   . Days of Exercise per Week: Not on file  . Minutes of Exercise per Session: Not on file  Stress:   . Feeling of Stress : Not on file  Social Connections:   . Frequency of Communication with Friends and Family: Not on file  . Frequency of Social Gatherings with Friends and Family: Not on file  . Attends Religious Services: Not on file  . Active Member of Clubs or Organizations: Not on file  . Attends Archivist Meetings: Not on file  . Marital Status: Not on file    Family History  Problem Relation Age of Onset  . Hypertension Mother   . Hypertension Father   . Cancer Son   . Colon cancer Neg Hx   . Rectal cancer Neg Hx   . Stomach  cancer Neg Hx   . Esophageal cancer Neg Hx     Medications:       Current Outpatient Medications:  .  albuterol (VENTOLIN HFA) 108 (90 Base) MCG/ACT inhaler, INHALE TWO PUFFS INTO THE LUNGS EVERY 6 HOURS AS NEEDED FOR WHEEZING OR SHORTNESS OF BREATH, Disp: 8.5 g, Rfl: 5 .  ALPRAZolam (XANAX) 0.5 MG tablet, TAKE 1 TABLET BY MOUTH TWICE DAILY AS NEEDED. MAY TAKE ONE MORE TABLET BEFORE FOR ALLERGY SHOTS ONCE A WEEK (30 DAY supply), Disp: 65 tablet, Rfl: 5 .  aspirin 81 MG chewable tablet, Chew 81 mg by mouth daily., Disp: , Rfl:  .  AUVI-Q 0.3 MG/0.3ML SOAJ injection, Use for life threatening allergic reactions, Disp: 4 each, Rfl: 3 .  budesonide-formoterol (SYMBICORT) 80-4.5 MCG/ACT inhaler, Inhale 2 puffs into the lungs daily., Disp: 1 Inhaler, Rfl: 5 .  cetirizine (ZYRTEC) 10 MG tablet, Take 1 tablet (10 mg total) by mouth 2 (two) times daily., Disp: 60 tablet, Rfl: 5 .  cromolyn (OPTICROM) 4 % ophthalmic solution, Place 1 drop into both eyes 4 (four) times daily. , Disp: , Rfl: 1 .  cycloSPORINE (RESTASIS) 0.05 % ophthalmic emulsion, Place 1 drop into both eyes 2 (two) times daily., Disp: , Rfl:  .  diclofenac Sodium (VOLTAREN) 1 % GEL, Apply 2 g topically 4 (four) times daily., Disp: , Rfl:  .  Diethylpropion HCl CR 75 MG TB24, Take 1 tablet (75 mg total) by mouth daily., Disp: 30 tablet, Rfl: 5 .  fluticasone (FLONASE) 50 MCG/ACT nasal spray, USE 1-2  SPRAYS IN EACH NOSTRIL THREE TIMES WEEKLY AS NEEDED, Disp: 16 g, Rfl: 5 .  HYDROcodone-acetaminophen (NORCO/VICODIN) 5-325 MG tablet, Take 1 tablet by mouth every 8 (eight) hours as needed for moderate pain., Disp: , Rfl:  .  linaclotide (LINZESS) 290 MCG CAPS capsule, Take 1 capsule (290 mcg total) by mouth daily before breakfast., Disp: 30 capsule, Rfl: 5 .  losartan (COZAAR) 25 MG tablet, Take 1 tablet (25 mg total) by mouth daily., Disp: 90 tablet, Rfl: 3 .  megestrol (MEGACE) 40 MG tablet, TAKE 3 TABLETS DAILY BY MOUTH FOR 5 DAYS, 2 TABLETS  DAILY FOR 5 DAYS, THEN 1 TABLET DAILY, Disp: 45 tablet, Rfl: 4 .  metFORMIN (GLUCOPHAGE) 500 MG tablet, TAKE 1 TABLET BY MOUTH EVERY DAY WITH BREAKFAST, Disp: 90 tablet, Rfl: 3 .  montelukast (SINGULAIR) 10 MG tablet, Take 1 tablet (10 mg total) by mouth at bedtime., Disp: 90 tablet, Rfl: 3 .  NUCALA 100 MG SOLR, inject 100 mg under the skin every 4 weeks (to be given by a healthcare provider), Disp: 1 each, Rfl: 10 .  Olopatadine HCl 0.6 % SOLN, USE TWO SPRAYS IN EACH NOSTRIL TWICE DAILY, Disp: 30.5 g, Rfl: 4 .  pantoprazole (PROTONIX) 40 MG tablet, Take 40 mg by mouth every morning., Disp: , Rfl:  .  pravastatin (PRAVACHOL) 40 MG tablet, Take 1 tablet (40 mg total) by mouth daily., Disp: 90 tablet, Rfl: 1 .  progesterone (PROMETRIUM) 200 MG capsule, Take 1 capsule (200 mg total) by mouth at bedtime., Disp: 30 capsule, Rfl: 11 .  Scar Treatment Products (CELACYN) GEL, Apply 1 application topically every morning., Disp: 28 g, Rfl: 3 .  spironolactone (ALDACTONE) 50 MG tablet, TAKE 1 TABLET BY MOUTH TWICE DAILY, Disp: 60 tablet, Rfl: 1 .  UNABLE TO FIND, Allergy shots-both arms-weekly, Disp: , Rfl:  .  VANIQA 13.9 % cream, APPLY TO AFFECTED AREA TWICE DAILY, Disp: 45 g, Rfl: 5  Current Facility-Administered Medications:  Marland Kitchen  Mepolizumab SOLR 100 mg, 100 mg, Subcutaneous, Q28 days, Kozlow, Donnamarie Poag, MD, 100 mg at 08/07/20 1343  Objective Blood pressure 116/78, pulse 83, height 5\' 2"  (1.575 m), weight 133 lb 8 oz (60.6 kg).  Gen [facility] NAD  Pertinent ROS No burning with urination, frequency or urgency No nausea, vomiting or diarrhea Nor fever chills or other constitutional symptoms   Labs or studies     Impression Diagnoses this Encounter::   ICD-10-CM   1. Perimenopausal DUB  N92.4     Established relevant diagnosis(es):   Plan/Recommendations: No orders of the defined types were placed in this encounter.   Labs or Scans Ordered: No orders of the defined types were placed in  this encounter.   Management:: >synchronization phase is successful with megestrol with transition to prometrium >begin suppressive cycling with 21 days prometrium then 7 days off  Follow up No follow-ups on file.        Face to face time:  80minutes  Greater than 50% of the visit time was spent in counseling and coordination of care with the patient.  The summary and outline of the counseling and care coordination is summarized in the note above.   All questions were answered.

## 2020-08-28 NOTE — Patient Instructions (Signed)
21 days +/- on for the prometrium then   7 days off  Rinse and repeat.Marland KitchenMarland Kitchen

## 2020-08-29 ENCOUNTER — Ambulatory Visit (INDEPENDENT_AMBULATORY_CARE_PROVIDER_SITE_OTHER): Payer: 59 | Admitting: Podiatry

## 2020-08-29 ENCOUNTER — Ambulatory Visit: Payer: Self-pay

## 2020-08-29 ENCOUNTER — Other Ambulatory Visit: Payer: Self-pay

## 2020-08-29 DIAGNOSIS — M76821 Posterior tibial tendinitis, right leg: Secondary | ICD-10-CM

## 2020-08-30 ENCOUNTER — Telehealth: Payer: Self-pay | Admitting: Obstetrics & Gynecology

## 2020-08-30 ENCOUNTER — Telehealth: Payer: Self-pay | Admitting: *Deleted

## 2020-08-30 NOTE — Telephone Encounter (Signed)
Patient started bleeding this morning, states that Dr. Elonda Husky told her whenever she was ready to stop the progesterone (PROMETRIUM) 200 MG capsule for 7 days Patient planned on stopping Monday for the 7days however she's unsure if the 7 days off of the medicine should start tonight due to starting bleeding this morning  Please advise do patient knows if she should continue med tonight or stop for the 7 days

## 2020-08-30 NOTE — Progress Notes (Signed)
She presents today states that she seems to be doing some better after her physical therapy.  She states that I love these orthotics the height of them that gives me a lot of support on this tendon but these other ones seem to be not as supportive.  Objective: She is not as tender on physical exam as she has been in the past and appears to be functioning normally.  Mild edema still present left.  Assessment posterior tibial tendinitis right neuroma left  Plan: She needs to see Liliane Channel to have the orthotics readjusted she needs an orthotic with an extrinsic post Legrand Como her initial orthotics and this at least a slightly elevated navicular ledge.

## 2020-08-30 NOTE — Telephone Encounter (Signed)
Pt is taking Prometrium at night. Pt started bleeding today. Last period was 07/25/20. It was light. Pt was going to stop the Prometrium Monday and be off for 7 days. Dr. Elonda Husky not in the office today. I spoke with JAG and advised pt to let today be the first day of the 7 days off. Pt voiced understanding. [place]

## 2020-08-31 ENCOUNTER — Encounter: Payer: Self-pay | Admitting: *Deleted

## 2020-08-31 NOTE — Telephone Encounter (Signed)
Stay with the plan as prescribed

## 2020-08-31 NOTE — Progress Notes (Signed)
ok 

## 2020-08-31 NOTE — Telephone Encounter (Signed)
Dr. Elonda Husky is aware pt was advised to stop med for 7 days. [place]

## 2020-08-31 NOTE — Telephone Encounter (Signed)
Stay with the plan do not alter it based on the bleeding

## 2020-09-04 ENCOUNTER — Telehealth: Payer: Self-pay

## 2020-09-04 ENCOUNTER — Ambulatory Visit (INDEPENDENT_AMBULATORY_CARE_PROVIDER_SITE_OTHER): Payer: 59

## 2020-09-04 ENCOUNTER — Telehealth: Payer: Self-pay | Admitting: Advanced Practice Midwife

## 2020-09-04 ENCOUNTER — Telehealth: Payer: Self-pay | Admitting: Obstetrics & Gynecology

## 2020-09-04 DIAGNOSIS — J309 Allergic rhinitis, unspecified: Secondary | ICD-10-CM

## 2020-09-04 NOTE — Telephone Encounter (Signed)
Pt calling about a message that was previously put in on 09/04/20 about beginning take the medication and when she needs to beginning due to the heavy bleeding

## 2020-09-04 NOTE — Telephone Encounter (Signed)
Yes continue with the plan, if there is bleeding it is because there is tissue to slough, we are controlling her endometrium with the cyclical prometrium so stay on it as previously prescribed, do not waver from the plan!!

## 2020-09-04 NOTE — Telephone Encounter (Signed)
Patient states having heavy bleeding and would like to know if she should still continue to take meds that dr. Elonda Husky prescribed.

## 2020-09-04 NOTE — Telephone Encounter (Signed)
Pt is on day 6 of stopping her meds. She had heavy bleeding over the weekend and it is lighter today. Patient would like to know if she should restart the medications? Will route note to Dr. Elonda Husky for recommendation.

## 2020-09-04 NOTE — Telephone Encounter (Signed)
Patient calling back, see below for previous message  Patient states she stopped medicine on the 13th, so this evening will be day 6 of stopping & that her flow was heavy Saturday & Sunday & has slowed today   Please advise - also please call pt as she states she doesn't have her MyChart set up to receive messages     Jennifer Canterbury, LPN to Jennifer, Mullins    09/04/20 1:29 PM Jennifer Mullins,  Are you still on the 7 day break from the medication? If so what day are you on?  Estill Bamberg   This MyChart message has not been read.  AK  09/04/20 10:35 AM Jennifer Mullins routed this conversation to Jennifer Canterbury, LPN  Jennifer Mullins   AK  09/04/20 10:35 AM Note Patient states having heavy bleeding and would like to know if she should still continue to take meds that dr. Elonda Husky prescribed.

## 2020-09-05 ENCOUNTER — Telehealth: Payer: Self-pay | Admitting: Obstetrics & Gynecology

## 2020-09-05 NOTE — Telephone Encounter (Signed)
Pt calling back to report that the bleeding has almost stopped & she thinks she'll be ok

## 2020-09-05 NOTE — Telephone Encounter (Signed)
Called patient and left message that I am returning her call.

## 2020-09-08 ENCOUNTER — Other Ambulatory Visit: Payer: Self-pay | Admitting: Obstetrics & Gynecology

## 2020-09-08 ENCOUNTER — Ambulatory Visit (HOSPITAL_COMMUNITY): Admission: RE | Admit: 2020-09-08 | Payer: 59 | Source: Ambulatory Visit

## 2020-09-08 ENCOUNTER — Ambulatory Visit (INDEPENDENT_AMBULATORY_CARE_PROVIDER_SITE_OTHER): Payer: 59 | Admitting: *Deleted

## 2020-09-08 ENCOUNTER — Other Ambulatory Visit: Payer: Self-pay

## 2020-09-08 DIAGNOSIS — Z1231 Encounter for screening mammogram for malignant neoplasm of breast: Secondary | ICD-10-CM

## 2020-09-08 DIAGNOSIS — J4550 Severe persistent asthma, uncomplicated: Secondary | ICD-10-CM | POA: Diagnosis not present

## 2020-09-08 DIAGNOSIS — J4540 Moderate persistent asthma, uncomplicated: Secondary | ICD-10-CM

## 2020-09-11 ENCOUNTER — Other Ambulatory Visit: Payer: Self-pay

## 2020-09-11 ENCOUNTER — Ambulatory Visit (HOSPITAL_COMMUNITY)
Admission: RE | Admit: 2020-09-11 | Discharge: 2020-09-11 | Disposition: A | Discharge status: Home / Self Care | Payer: 59 | Source: Ambulatory Visit | Attending: Otolaryngology | Admitting: Otolaryngology

## 2020-09-11 DIAGNOSIS — J320 Chronic maxillary sinusitis: Secondary | ICD-10-CM | POA: Insufficient documentation

## 2020-09-20 ENCOUNTER — Other Ambulatory Visit: Payer: Self-pay | Admitting: Physician Assistant

## 2020-09-20 DIAGNOSIS — L680 Hirsutism: Secondary | ICD-10-CM

## 2020-09-21 ENCOUNTER — Ambulatory Visit: Payer: 59 | Attending: Internal Medicine

## 2020-09-21 DIAGNOSIS — Z23 Encounter for immunization: Secondary | ICD-10-CM

## 2020-09-21 NOTE — Progress Notes (Signed)
   Covid-19 Vaccination Clinic  Name:  DARRIEN LAAKSO    MRN: [id number] DOB: 1971-07-14  09/21/2020  Ms. Marceaux was observed post Covid-19 immunization for 15 minutes without incident. She was provided with Vaccine Information Sheet and instruction to access the V-Safe system.   Ms. Ebron was instructed to call 911 with any severe reactions post vaccine: Marland Kitchen Difficulty breathing  . Swelling of face and throat  . A fast heartbeat  . A bad rash all over body  . Dizziness and weakness

## 2020-09-22 ENCOUNTER — Telehealth: Payer: Self-pay | Admitting: Obstetrics & Gynecology

## 2020-09-22 NOTE — Telephone Encounter (Signed)
Keep taking it as prescribed no matter the bleeding timing

## 2020-09-22 NOTE — Telephone Encounter (Signed)
Left message letting pt know to keep taking the med as prescribed, no matter the bleeding timing. [place]

## 2020-09-22 NOTE — Telephone Encounter (Signed)
Pt states she's having the same issue again as last month  She's on her 21 days on of the medicine she's on for menopause, the 9th will be the 21st day then she's supposed to be off the medicine for 7 days  She started bleeding this morning, wants to know should she continue the meds until the 9th (the 21st day) or stop it today for day 1 of the 7 off days  Please advise & call pt  Please leave a detailed message if she doesn't answer & please do NOT respond through [place]

## 2020-10-03 ENCOUNTER — Encounter: Payer: Self-pay | Admitting: Allergy and Immunology

## 2020-10-03 ENCOUNTER — Other Ambulatory Visit: Payer: Self-pay

## 2020-10-03 ENCOUNTER — Ambulatory Visit (INDEPENDENT_AMBULATORY_CARE_PROVIDER_SITE_OTHER): Payer: 59 | Admitting: Allergy and Immunology

## 2020-10-03 ENCOUNTER — Ambulatory Visit: Payer: Self-pay

## 2020-10-03 VITALS — BP 124/78 | HR 90 | Temp 98.0°F | Resp 18

## 2020-10-03 DIAGNOSIS — J309 Allergic rhinitis, unspecified: Secondary | ICD-10-CM

## 2020-10-03 DIAGNOSIS — H1010 Acute atopic conjunctivitis, unspecified eye: Secondary | ICD-10-CM | POA: Diagnosis not present

## 2020-10-03 DIAGNOSIS — J301 Allergic rhinitis due to pollen: Secondary | ICD-10-CM

## 2020-10-03 DIAGNOSIS — K219 Gastro-esophageal reflux disease without esophagitis: Secondary | ICD-10-CM

## 2020-10-03 DIAGNOSIS — J3089 Other allergic rhinitis: Secondary | ICD-10-CM

## 2020-10-03 DIAGNOSIS — J4540 Moderate persistent asthma, uncomplicated: Secondary | ICD-10-CM | POA: Diagnosis not present

## 2020-10-03 MED ORDER — BUDESONIDE 0.5 MG/2ML IN SUSP: RESPIRATORY_TRACT | 1 refills | Status: DC

## 2020-10-03 NOTE — Progress Notes (Signed)
Jennifer Mullins   Follow-up Note  Referring Provider: Hector Brunswick* Primary Provider: Wannetta Sender, FNP Date of Office Visit: 10/03/2020  Subjective:   Jennifer Mullins (DOB: Apr 27, 1971) is a 49 y.o. female who returns to the [facility] [place] on 10/03/2020 in re-evaluation of the following:  HPI: Avin returns to this clinic in evaluation of asthma and allergic rhinoconjunctivitis and history of recurrent sinusitis and history of dry eye syndrome and history of LPR.  Her last visit to this clinic was 16 May 2020.  During her last visit we gave her prolonged antibiotics to treat what may have been a episode of sinusitis and she continued to have problems and visited with her ENT doctor who once again gave her more antibiotics and then performed a CT scan of her sinuses which identified no evidence of chronic sinusitis.  Even in the face of that study she still believes that she has sinusitis as she makes this ugly nasal discharge that appears to be a pretty consistent issue and she still has a facial pain issue specially around her maxillary sinuses and headaches.  She continues to use a nasal steroid on a pretty regular basis.  Her asthma has been under excellent control at this point in time utilizing Symbicort mostly just 1 time per day and continuing on mepolizumab injections.  Her reflux has been under excellent control while using pantoprazole.  Her eyes have not been causing her problem and she continues to use therapy prescribed by her eye doctor.  She has received 3 Moderna Covid vaccines.  Allergies as of 10/03/2020      Reactions   Demerol [meperidine] Shortness Of Breath   FLUSHING AND SHORTNESS OF BREATH   Morphine And Related Shortness Of Breath   Flushed and hot hyper Flushed and hot hyper Flushed and hot hyper Flushed and hot hyper   Other Shortness Of Breath   Flushed and hot hyper Flushed  and hot hyper Flushed and hot hyper Flushed and hot hyper Flushed and hot hyper   Cardizem  [diltiazem Hcl] Other (See Comments)   Diltiazem Hcl Hives   Lisinopril    Caused cough   Lyrica [pregabalin]    Chest pain, elevated heart rate   Tramadol Other (See Comments)   Tears up stomach      Medication List      albuterol 108 (90 Base) MCG/ACT inhaler Commonly known as: VENTOLIN HFA INHALE TWO PUFFS INTO THE LUNGS EVERY 6 HOURS AS NEEDED FOR WHEEZING OR SHORTNESS OF BREATH   ALPRAZolam 0.5 MG tablet Commonly known as: XANAX TAKE 1 TABLET BY MOUTH TWICE DAILY AS NEEDED. MAY TAKE ONE MORE TABLET BEFORE FOR ALLERGY SHOTS ONCE A WEEK (30 DAY supply)   aspirin 81 MG chewable tablet Chew 81 mg by mouth daily.   Auvi-Q 0.3 mg/0.3 mL Soaj injection Generic drug: EPINEPHrine Use for life threatening allergic reactions   budesonide-formoterol 80-4.5 MCG/ACT inhaler Commonly known as: Symbicort Inhale 2 puffs into the lungs daily.   Celacyn Gel Apply 1 application topically every morning.   cetirizine 10 MG tablet Commonly known as: ZYRTEC Take 1 tablet (10 mg total) by mouth 2 (two) times daily.   cromolyn 4 % ophthalmic solution Commonly known as: OPTICROM Place 1 drop into both eyes 4 (four) times daily.   cycloSPORINE 0.05 % ophthalmic emulsion Commonly known as: RESTASIS Place 1 drop into both eyes 2 (two) times  daily.   diclofenac Sodium 1 % Gel Commonly known as: VOLTAREN Apply 2 g topically 4 (four) times daily.   Diethylpropion HCl CR 75 MG Tb24 Take 1 tablet (75 mg total) by mouth daily.   fluticasone 50 MCG/ACT nasal spray Commonly known as: FLONASE USE 1-2 SPRAYS IN EACH NOSTRIL THREE TIMES WEEKLY AS NEEDED   HYDROCODONE-ACETAMINOPHEN PO Take 1 tablet by mouth every 8 (eight) hours as needed. 7-325 tablet   linaclotide 290 MCG Caps capsule Commonly known as: Linzess Take 1 capsule (290 mcg total) by mouth daily before breakfast.   losartan 25 MG  tablet Commonly known as: COZAAR Take 1 tablet (25 mg total) by mouth daily.   megestrol 40 MG tablet Commonly known as: MEGACE TAKE 3 TABLETS DAILY BY MOUTH FOR 5 DAYS, 2 TABLETS DAILY FOR 5 DAYS, THEN 1 TABLET DAILY   metFORMIN 500 MG tablet Commonly known as: GLUCOPHAGE TAKE 1 TABLET BY MOUTH EVERY DAY WITH BREAKFAST   montelukast 10 MG tablet Commonly known as: SINGULAIR Take 1 tablet (10 mg total) by mouth at bedtime.   Nucala 100 MG Solr Generic drug: Mepolizumab inject 100 mg under the skin every 4 weeks (to be given by a healthcare provider)   Olopatadine HCl 0.6 % Soln USE TWO SPRAYS IN EACH NOSTRIL TWICE DAILY   pantoprazole 40 MG tablet Commonly known as: PROTONIX Take 40 mg by mouth every morning.   pravastatin 40 MG tablet Commonly known as: PRAVACHOL Take 1 tablet (40 mg total) by mouth daily.   progesterone 200 MG capsule Commonly known as: Prometrium Take 1 capsule (200 mg total) by mouth at bedtime.   spironolactone 50 MG tablet Commonly known as: ALDACTONE TAKE 1 TABLET BY MOUTH TWICE DAILY   UNABLE TO FIND Allergy shots-both arms-weekly   Vaniqa 13.9 % cream Generic drug: Eflornithine HCl APPLY TO AFFECTED AREA TWICE DAILY       Past Medical History:  Diagnosis Date  . Anxiety   . Arthritis   . Asthma   . Bilateral calcaneal spurs   . Bilateral polycystic ovarian syndrome   . Bulging lumbar disc   . Carpal tunnel syndrome, bilateral   . COPD (chronic obstructive pulmonary disease) (HCC)    BRONCHITIS  . Diabetes mellitus without complication ([place])    TYPE 2   DX  4-5 YRS AGO  . ETD (Eustachian tube dysfunction), bilateral    left worse than right  . Fibromyalgia   . GAD (generalized anxiety disorder)   . GERD (gastroesophageal reflux disease)    TAKES PRESCRIPTION MEDS  . Glaucoma    BOTH EYES  . Hyperlipidemia   . Hypertension   . Hypothyroidism    NODULES ON THYROID  . PONV (postoperative nausea and vomiting)   .  Pulmonary embolism ([place])   . Tachycardia   . TMJ (dislocation of temporomandibular joint)   . Trigger finger   . Trigger finger of left thumb     Past Surgical History:  Procedure Laterality Date  . ANTERIOR CERVICAL DECOMP/DISCECTOMY FUSION N/A 11/13/2016   Procedure: Cervical five-six, Cervical six-seven Anterior Cervical Discectomy and Fusion, Allograft, Plate;  Surgeon: Marybelle Killings, MD;  Location: [facility];  Service: Orthopedics;  Laterality: N/A;  . BREAST SURGERY    . CARPAL TUNNEL RELEASE    . DILATION AND CURETTAGE OF UTERUS    . EXPLORATORY LAPAROTOMY    . LIPOMA EXCISION Left 06/15/2018   Procedure: LEFT CALF LIPOMA EXCISION;  Surgeon: Marybelle Killings,  MD;  Location: [facility];  Service: Orthopedics;  Laterality: Left;  . NASAL SINUS SURGERY  07/22/13   Dr. Redmond Pulling in [place]  . plate and six screws in back    . TRIGGER FINGER RELEASE Right 06/15/2018   Procedure: RIGHT TRIGGER THUMB RELEASE;  Surgeon: Marybelle Killings, MD;  Location: [facility];  Service: Orthopedics;  Laterality: Right;    Review of systems negative except as noted in HPI / PMHx or noted below:  Review of Systems  Constitutional: Negative.   HENT: Negative.   Eyes: Negative.   Respiratory: Negative.   Cardiovascular: Negative.   Gastrointestinal: Negative.   Genitourinary: Negative.   Musculoskeletal: Negative.   Skin: Negative.   Neurological: Negative.   Endo/Heme/Allergies: Negative.   Psychiatric/Behavioral: Negative.      Objective:   Vitals:   10/03/20 1547  BP: 124/78  Pulse: 90  Resp: 18  Temp: 98 F (36.7 C)  SpO2: 99%          Physical Exam Constitutional:      Appearance: She is not diaphoretic.  HENT:     Head: Normocephalic.     Right Ear: Tympanic membrane, ear canal and external ear normal.     Left Ear: Tympanic membrane, ear canal and external ear normal.     Nose: Nose normal. No mucosal edema or rhinorrhea.     Mouth/Throat:     Pharynx:  Uvula midline. No oropharyngeal exudate.  Eyes:     Conjunctiva/sclera: Conjunctivae normal.  Neck:     Thyroid: No thyromegaly.     Trachea: Trachea normal. No tracheal tenderness or tracheal deviation.  Cardiovascular:     Rate and Rhythm: Normal rate and regular rhythm.     Heart sounds: Normal heart sounds, S1 normal and S2 normal. No murmur heard.   Pulmonary:     Effort: No respiratory distress.     Breath sounds: Normal breath sounds. No stridor. No wheezing or rales.  Lymphadenopathy:     Head:     Right side of head: No tonsillar adenopathy.     Left side of head: No tonsillar adenopathy.     Cervical: No cervical adenopathy.  Skin:    Findings: No erythema or rash.     Nails: There is no clubbing.  Neurological:     Mental Status: She is alert.     Diagnostics:    Spirometry was performed and demonstrated an FEV1 of 2.05 at 97 % of predicted.  The patient had an Asthma Control Test with the following results: ACT Total Score: 22.    Assessment and Plan:   1. Asthma, moderate persistent, well-controlled   2. Perennial allergic rhinitis   3. Seasonal allergic rhinitis due to pollen   4. Seasonal allergic conjunctivitis   5. LPRD (laryngopharyngeal reflux disease)     1. Continue Immunotherapy. Decrease dose to 0.2 red vial. Do not increase past this concentration.  2. Continue to Treat and prevent inflammation:   A. Budesonide washes ( 0.5 ampule in 200 mls saline) 2 times per day  B. Symbicort 80 - 2 inhalations 3-7 times per week  C. montelukast 10 mg - one tablet one time per day  D. Mepolizumab injections   3. Continue to Treat and prevent reflux:   A. pantoprazole 40 mg in a.m.  4. If needed:   A. Patanase 2 sprays each nostril two times per day  B. OTC antihistamine - Zyrtec 10mg  1-2 times per  day (dry eye?)  C. Proventil HFA or similar 2 inhalations every 4-6 hours  D. Epi-Pen  E. Eye therapy with eye doctor  5. Return in 4 weeks or earlier  if problem  Maytal will now utilize budesonide washes to replace her nasal steroid and hopefully this will help with moving mucus through her head and clearing out what may be some low-grade bacterial growth within her upper airway.  I will see her back in his clinic in 4 weeks to assess her response to that approach.  She will continue to use a large collection of anti-inflammatory agents for her airway including mepolizumab and therapy directed against reflux as noted above.  Allena Katz, MD Allergy / Immunology [place]

## 2020-10-03 NOTE — Patient Instructions (Addendum)
  1. Continue Immunotherapy. Decrease dose to 0.2 red vial. Do not increase past this concentration.  2. Continue to Treat and prevent inflammation:   A. Budesonide washes ( 0.5 ampule in 200 mls saline) 2 times per day  B. Symbicort 80 - 2 inhalations 3-7 times per week  C. montelukast 10 mg - one tablet one time per day  D. Mepolizumab injections   3. Continue to Treat and prevent reflux:   A. pantoprazole 40 mg in a.m.  4. If needed:   A. Patanase 2 sprays each nostril two times per day  B. OTC antihistamine - Zyrtec 10mg  1-2 times per day (dry eye?)  C. Proventil HFA or similar 2 inhalations every 4-6 hours  D. Epi-Pen  E. Eye therapy with eye doctor  5. Return in 4 weeks or earlier if problem

## 2020-10-04 ENCOUNTER — Encounter: Payer: Self-pay | Admitting: Allergy and Immunology

## 2020-10-05 ENCOUNTER — Telehealth: Payer: Self-pay | Admitting: Allergy and Immunology

## 2020-10-05 ENCOUNTER — Other Ambulatory Visit: Payer: Self-pay | Admitting: *Deleted

## 2020-10-05 MED ORDER — BUDESONIDE 0.5 MG/2ML IN SUSP: RESPIRATORY_TRACT | 1 refills | Status: DC

## 2020-10-06 ENCOUNTER — Ambulatory Visit (INDEPENDENT_AMBULATORY_CARE_PROVIDER_SITE_OTHER): Payer: 59

## 2020-10-06 ENCOUNTER — Other Ambulatory Visit: Payer: Self-pay

## 2020-10-06 DIAGNOSIS — J4540 Moderate persistent asthma, uncomplicated: Secondary | ICD-10-CM

## 2020-10-10 ENCOUNTER — Ambulatory Visit: Payer: 59 | Admitting: Allergy and Immunology

## 2020-10-11 ENCOUNTER — Telehealth: Payer: Self-pay | Admitting: *Deleted

## 2020-10-11 NOTE — Telephone Encounter (Signed)
Please instruct [name] that she is being a little bit too forceful with her nasal spray/wash.  She needs to be somewhat gentle and maybe use only half the volume of fluid that she had used in the past and see how this goes.

## 2020-10-11 NOTE — Telephone Encounter (Signed)
Patient called stating that this is the second time she has used the budesonide nasal rinse as instructed. She stated that today when she used it she noted a lot of burning and increased congestion and now she can't smell. She described how she was using it and it sounded correct. Please advise.

## 2020-10-11 NOTE — Telephone Encounter (Signed)
Patient stated she is already using half of vial on budesonide solution mixed in with nasal saline with 200 mL of Distill water bid as directed. She is still getting burning sensation and is spraying into her nostril. She stated this is the second time this has occurred and is wondering is she needs to do less such as doing up to 140 mL instead of 200 mL.

## 2020-10-16 NOTE — Telephone Encounter (Signed)
Patient is still complaining of burning in the nostrils, nasal congestion. She stated she is having difficult smelling through her nostrils. Spoke with Dr. Neldon Mc agrees with doing 200 mL of Normal saline with 1/4 ampule of Budesonide to reduce the burning sensation.

## 2020-10-16 NOTE — Telephone Encounter (Signed)
Patient stated she was going to try 1/4 of her budesonide ampule instead of half with the 100 mL of Saline solutions to see if this minimized any burning sensations. Are you okay with that plan?

## 2020-10-16 NOTE — Telephone Encounter (Signed)
Lets have her try that and see how things move forward with that plan.

## 2020-10-16 NOTE — Telephone Encounter (Signed)
Please have her try just 100 mL of saline wash per wash.

## 2020-10-18 ENCOUNTER — Other Ambulatory Visit: Payer: Self-pay | Admitting: *Deleted

## 2020-10-18 ENCOUNTER — Ambulatory Visit (INDEPENDENT_AMBULATORY_CARE_PROVIDER_SITE_OTHER): Payer: 59 | Admitting: *Deleted

## 2020-10-18 ENCOUNTER — Telehealth: Payer: Self-pay | Admitting: *Deleted

## 2020-10-18 DIAGNOSIS — J309 Allergic rhinitis, unspecified: Secondary | ICD-10-CM

## 2020-10-18 MED ORDER — BACTROBAN NASAL 2 % NA OINT: TOPICAL_OINTMENT | NASAL | 0 refills | Status: DC

## 2020-10-18 MED ORDER — MUPIROCIN 2 % EX OINT: TOPICAL_OINTMENT | CUTANEOUS | 0 refills | Status: DC

## 2020-10-18 NOTE — Telephone Encounter (Signed)
Lets have Jennifer Mullins utilize Bactroban in her nose 3 times per day for the next 10 days.

## 2020-10-18 NOTE — Telephone Encounter (Signed)
Patient came in for her allergy injections and stated that even with decreasing the budesonide and nasal saline for rinses she is still having severe burning and loss of smell. She states that her ears get stopped up and she also feels weird tingling/itching sensations in different parts of the top of her head. I advised for her to stop using the medication until we hear back from you. Please advise.

## 2020-10-18 NOTE — Telephone Encounter (Signed)
Patient wants to know should she continue all other medications and nasal sprays while using the bactroban.

## 2020-10-18 NOTE — Telephone Encounter (Signed)
Left voicemail to inform patient.

## 2020-10-18 NOTE — Telephone Encounter (Signed)
Called and informed patient that new prescription has been sent in and to inform us if that works better for her. Patient wants to know if she should continue using the other medications. I asked if the other medications were giving her issues she denied that they were. I informed her that the medication that was prescribed to her she should continue  if they do not cause discomfort. Patient verbally agreed and verbalized understanding and acknowledged what was said to her

## 2020-10-18 NOTE — Addendum Note (Signed)
Addended by: Clovis Cao A on: 10/18/2020 12:28 PM   Modules accepted: Orders

## 2020-10-18 NOTE — Telephone Encounter (Signed)
Just go with Bactroban for the initial 5 days

## 2020-10-18 NOTE — Telephone Encounter (Signed)
Patient wants to know if she should go back to using her flonase nasal spray after using the bactroban? Please advice. Thank you.

## 2020-10-18 NOTE — Telephone Encounter (Signed)
No Flonase until her nose heals up.

## 2020-10-24 NOTE — Telephone Encounter (Signed)
Patient called today stating that she has been having congestion in the morning and in the evening. She did use her Flonase on Saturday. She stated that she has been sneezing. She is still using the Bactroban in her nostrils. She is wondering should she use Zyrtec or Mucinex?

## 2020-10-25 ENCOUNTER — Telehealth: Payer: Self-pay | Admitting: Podiatry

## 2020-10-25 ENCOUNTER — Other Ambulatory Visit: Payer: Self-pay | Admitting: *Deleted

## 2020-10-25 MED ORDER — BUDESONIDE 32 MCG/ACT NA SUSP: 1.0000 | Freq: Every day | NASAL | 5 refills | Status: DC

## 2020-10-25 NOTE — Telephone Encounter (Signed)
Called and spoke with the patient and advised. Patient verbalized understanding and asked if I could send in the generic prescription to the pharmacy. I did advised that if it is not covered by her insurance it can be purchased over the counter. Patient verbalized understanding. General Rhinocort has been sent in.

## 2020-10-25 NOTE — Telephone Encounter (Signed)
Pt left message asking about the prior auth for orthotics to [facility].   I returned call and explained that I did fax prior auth on Monday as I told her I would and we have not heard back. She was asking if I could add in that she has Plantar fasciitis and the mri results. I explained that I am not able to change notes written by the doctor and I have faxed them with the authorization form. She is hoping they will cover them.  Pt asked if she could call to see if they were covered and I told her she could and gave her the cpt code of [id number] and also told her to make sure she gets the name of the person she talked with and a reference number from the call.

## 2020-10-25 NOTE — Telephone Encounter (Signed)
Unable to reach patient. We will try again later

## 2020-10-25 NOTE — Telephone Encounter (Signed)
Lets get her to start a nasal steroid but not Flonase as it has too much alcohol and is drying out her nose.  Lets get her to use OTC Rhinocort/budesonide at 1 spray each nostril 1 time per day.  She can add an antihistamine if she needs to do so.

## 2020-10-26 ENCOUNTER — Ambulatory Visit: Payer: 59 | Admitting: Physician Assistant

## 2020-10-30 ENCOUNTER — Ambulatory Visit (INDEPENDENT_AMBULATORY_CARE_PROVIDER_SITE_OTHER): Payer: 59 | Admitting: *Deleted

## 2020-10-30 ENCOUNTER — Other Ambulatory Visit: Payer: Self-pay | Admitting: Allergy and Immunology

## 2020-10-30 ENCOUNTER — Ambulatory Visit (INDEPENDENT_AMBULATORY_CARE_PROVIDER_SITE_OTHER): Payer: 59 | Admitting: Obstetrics & Gynecology

## 2020-10-30 ENCOUNTER — Other Ambulatory Visit: Payer: Self-pay

## 2020-10-30 ENCOUNTER — Encounter: Payer: Self-pay | Admitting: Obstetrics & Gynecology

## 2020-10-30 VITALS — BP 124/88 | HR 98 | Ht 62.0 in | Wt 130.0 lb

## 2020-10-30 DIAGNOSIS — N924 Excessive bleeding in the premenopausal period: Secondary | ICD-10-CM | POA: Diagnosis not present

## 2020-10-30 DIAGNOSIS — J309 Allergic rhinitis, unspecified: Secondary | ICD-10-CM

## 2020-10-30 DIAGNOSIS — D219 Benign neoplasm of connective and other soft tissue, unspecified: Secondary | ICD-10-CM

## 2020-10-30 NOTE — Telephone Encounter (Signed)
Please advise 

## 2020-10-30 NOTE — Progress Notes (Signed)
Chief Complaint  Patient presents with  . Follow-up      49 y.o. G1P0101 No LMP recorded. Patient is perimenopausal. The current method of family planning is peri menopausal.  Outpatient Encounter Medications as of 10/30/2020  Medication Sig  . albuterol (VENTOLIN HFA) 108 (90 Base) MCG/ACT inhaler INHALE TWO PUFFS INTO THE LUNGS EVERY 6 HOURS AS NEEDED FOR WHEEZING OR SHORTNESS OF BREATH  . ALPRAZolam (XANAX) 0.5 MG tablet TAKE 1 TABLET BY MOUTH TWICE DAILY AS NEEDED. MAY TAKE ONE MORE TABLET BEFORE FOR ALLERGY SHOTS ONCE A WEEK (30 DAY supply)  . aspirin 81 MG chewable tablet Chew 81 mg by mouth daily.  Marland Kitchen AUVI-Q 0.3 MG/0.3ML SOAJ injection Use for life threatening allergic reactions  . budesonide (PULMICORT) 0.5 MG/2ML nebulizer solution Use 0.5 ampule in 200 mL saline 2 times daily nasal rinse.  . budesonide (RHINOCORT AQUA) 32 MCG/ACT nasal spray Place 1 spray into both nostrils daily.  . budesonide-formoterol (SYMBICORT) 80-4.5 MCG/ACT inhaler Inhale 2 puffs into the lungs daily.  . cetirizine (ZYRTEC) 10 MG tablet Take 1 tablet (10 mg total) by mouth 2 (two) times daily.  . cromolyn (OPTICROM) 4 % ophthalmic solution Place 1 drop into both eyes 4 (four) times daily.   . cycloSPORINE (RESTASIS) 0.05 % ophthalmic emulsion Place 1 drop into both eyes 2 (two) times daily.  . diclofenac Sodium (VOLTAREN) 1 % GEL Apply 2 g topically 4 (four) times daily.  . Diethylpropion HCl CR 75 MG TB24 Take 1 tablet (75 mg total) by mouth daily.  . fluticasone (FLONASE) 50 MCG/ACT nasal spray USE 1-2 SPRAYS IN EACH NOSTRIL THREE TIMES WEEKLY AS NEEDED  . HYDROCODONE-ACETAMINOPHEN PO Take 1 tablet by mouth every 8 (eight) hours as needed. 5-325 mg  . linaclotide (LINZESS) 290 MCG CAPS capsule Take 1 capsule (290 mcg total) by mouth daily before breakfast.  . losartan (COZAAR) 25 MG tablet Take 1 tablet (25 mg total) by mouth daily.  . megestrol (MEGACE) 40 MG tablet TAKE 3 TABLETS DAILY BY  MOUTH FOR 5 DAYS, 2 TABLETS DAILY FOR 5 DAYS, THEN 1 TABLET DAILY  . metFORMIN (GLUCOPHAGE) 500 MG tablet TAKE 1 TABLET BY MOUTH EVERY DAY WITH BREAKFAST  . montelukast (SINGULAIR) 10 MG tablet Take 1 tablet (10 mg total) by mouth at bedtime.  Marland Kitchen NUCALA 100 MG SOLR inject 100 mg under the skin every 4 weeks (to be given by a healthcare provider)  . Olopatadine HCl 0.6 % SOLN USE TWO SPRAYS IN EACH NOSTRIL TWICE DAILY  . pantoprazole (PROTONIX) 40 MG tablet Take 40 mg by mouth every morning.  . pravastatin (PRAVACHOL) 40 MG tablet Take 1 tablet (40 mg total) by mouth daily.  . progesterone (PROMETRIUM) 200 MG capsule Take 1 capsule (200 mg total) by mouth at bedtime.  Marland Kitchen spironolactone (ALDACTONE) 50 MG tablet TAKE 1 TABLET BY MOUTH TWICE DAILY  . UNABLE TO FIND Allergy shots-both arms-weekly  . VANIQA 13.9 % cream APPLY TO AFFECTED AREA TWICE DAILY  . Scar Treatment Products (CELACYN) GEL Apply 1 application topically every morning. (Patient not taking: Reported on 10/30/2020)  . [DISCONTINUED] BACTROBAN NASAL 2 % Apply ointment in nose 3 times per day for the next 10 days.  . [DISCONTINUED] mupirocin ointment (BACTROBAN) 2 % Apply ointment in nose 3 times a day for the next 10 days.   Facility-Administered Encounter Medications as of 10/30/2020  Medication  . Mepolizumab SOLR 100 mg    Subjective Pt is  doing well on the prometrium 200 mg x 21 days then off for 7 days Has not bled with this particular withdrawal Past Medical History:  Diagnosis Date  . Anxiety   . Arthritis   . Asthma   . Bilateral calcaneal spurs   . Bilateral polycystic ovarian syndrome   . Bulging lumbar disc   . Carpal tunnel syndrome, bilateral   . COPD (chronic obstructive pulmonary disease) (HCC)    BRONCHITIS  . Diabetes mellitus without complication ([place])    TYPE 2   DX  4-5 YRS AGO  . ETD (Eustachian tube dysfunction), bilateral    left worse than right  . Fibromyalgia   . GAD (generalized anxiety  disorder)   . GERD (gastroesophageal reflux disease)    TAKES PRESCRIPTION MEDS  . Glaucoma    BOTH EYES  . Hyperlipidemia   . Hypertension   . Hypothyroidism    NODULES ON THYROID  . PONV (postoperative nausea and vomiting)   . Pulmonary embolism ([place])   . Tachycardia   . TMJ (dislocation of temporomandibular joint)   . Trigger finger   . Trigger finger of left thumb     Past Surgical History:  Procedure Laterality Date  . ANTERIOR CERVICAL DECOMP/DISCECTOMY FUSION N/A 11/13/2016   Procedure: Cervical five-six, Cervical six-seven Anterior Cervical Discectomy and Fusion, Allograft, Plate;  Surgeon: Marybelle Killings, MD;  Location: [facility];  Service: Orthopedics;  Laterality: N/A;  . BREAST SURGERY    . CARPAL TUNNEL RELEASE    . DILATION AND CURETTAGE OF UTERUS    . EXPLORATORY LAPAROTOMY    . LIPOMA EXCISION Left 06/15/2018   Procedure: LEFT CALF LIPOMA EXCISION;  Surgeon: Marybelle Killings, MD;  Location: [facility];  Service: Orthopedics;  Laterality: Left;  . NASAL SINUS SURGERY  07/22/13   Dr. Redmond Pulling in [place]  . plate and six screws in back    . TRIGGER FINGER RELEASE Right 06/15/2018   Procedure: RIGHT TRIGGER THUMB RELEASE;  Surgeon: Marybelle Killings, MD;  Location: [place];  Service: Orthopedics;  Laterality: Right;    OB History    Gravida  1   Para  1   Term      Preterm  1   AB      Living  1     SAB      IAB      Ectopic      Multiple      Live Births  1           Allergies  Allergen Reactions  . Demerol [Meperidine] Shortness Of Breath    FLUSHING AND SHORTNESS OF BREATH  . Morphine And Related Shortness Of Breath    Flushed and hot hyper Flushed and hot hyper Flushed and hot hyper Flushed and hot hyper  . Other Shortness Of Breath    Flushed and hot hyper Flushed and hot hyper Flushed and hot hyper Flushed and hot hyper Flushed and hot hyper  . Cardizem  [Diltiazem Hcl] Other (See Comments)  . Diltiazem Hcl  Hives  . Gabapentin Other (See Comments)    Chest pain and elevated heart rate  . Lisinopril     Caused cough  . Lyrica [Pregabalin]     Chest pain, elevated heart rate  . Tramadol Other (See Comments)    Tears up stomach    Social History   Socioeconomic History  . Marital status: Married    Spouse name: Not  on file  . Number of children: Not on file  . Years of education: Not on file  . Highest education level: Not on file  Occupational History  . Occupation: Unemployed  Tobacco Use  . Smoking status: Former Smoker    Packs/day: 0.25    Years: 5.00    Pack years: 1.25    Types: Cigarettes    Quit date: 11/18/1989    Years since quitting: 30.9  . Smokeless tobacco: Never Used  Vaping Use  . Vaping Use: Never used  Substance and Sexual Activity  . Alcohol use: No  . Drug use: No  . Sexual activity: Yes    Birth control/protection: None  Other Topics Concern  . Not on file  Social History Narrative  . Not on file   Social Determinants of Health   Financial Resource Strain: Not on file  Food Insecurity: Not on file  Transportation Needs: Not on file  Physical Activity: Not on file  Stress: Not on file  Social Connections: Not on file    Family History  Problem Relation Age of Onset  . Hypertension Mother   . Hypertension Father   . Cancer Son   . Colon cancer Neg Hx   . Rectal cancer Neg Hx   . Stomach cancer Neg Hx   . Esophageal cancer Neg Hx     Medications:       Current Outpatient Medications:  .  albuterol (VENTOLIN HFA) 108 (90 Base) MCG/ACT inhaler, INHALE TWO PUFFS INTO THE LUNGS EVERY 6 HOURS AS NEEDED FOR WHEEZING OR SHORTNESS OF BREATH, Disp: 8.5 g, Rfl: 5 .  ALPRAZolam (XANAX) 0.5 MG tablet, TAKE 1 TABLET BY MOUTH TWICE DAILY AS NEEDED. MAY TAKE ONE MORE TABLET BEFORE FOR ALLERGY SHOTS ONCE A WEEK (30 DAY supply), Disp: 65 tablet, Rfl: 5 .  aspirin 81 MG chewable tablet, Chew 81 mg by mouth daily., Disp: , Rfl:  .  AUVI-Q 0.3 MG/0.3ML SOAJ  injection, Use for life threatening allergic reactions, Disp: 4 each, Rfl: 3 .  budesonide (PULMICORT) 0.5 MG/2ML nebulizer solution, Use 0.5 ampule in 200 mL saline 2 times daily nasal rinse., Disp: 120 mL, Rfl: 1 .  budesonide (RHINOCORT AQUA) 32 MCG/ACT nasal spray, Place 1 spray into both nostrils daily., Disp: 8.43 mL, Rfl: 5 .  budesonide-formoterol (SYMBICORT) 80-4.5 MCG/ACT inhaler, Inhale 2 puffs into the lungs daily., Disp: 1 Inhaler, Rfl: 5 .  cetirizine (ZYRTEC) 10 MG tablet, Take 1 tablet (10 mg total) by mouth 2 (two) times daily., Disp: 60 tablet, Rfl: 5 .  cromolyn (OPTICROM) 4 % ophthalmic solution, Place 1 drop into both eyes 4 (four) times daily. , Disp: , Rfl: 1 .  cycloSPORINE (RESTASIS) 0.05 % ophthalmic emulsion, Place 1 drop into both eyes 2 (two) times daily., Disp: , Rfl:  .  diclofenac Sodium (VOLTAREN) 1 % GEL, Apply 2 g topically 4 (four) times daily., Disp: , Rfl:  .  Diethylpropion HCl CR 75 MG TB24, Take 1 tablet (75 mg total) by mouth daily., Disp: 30 tablet, Rfl: 5 .  fluticasone (FLONASE) 50 MCG/ACT nasal spray, USE 1-2 SPRAYS IN EACH NOSTRIL THREE TIMES WEEKLY AS NEEDED, Disp: 16 g, Rfl: 5 .  HYDROCODONE-ACETAMINOPHEN PO, Take 1 tablet by mouth every 8 (eight) hours as needed. 5-325 mg, Disp: , Rfl:  .  linaclotide (LINZESS) 290 MCG CAPS capsule, Take 1 capsule (290 mcg total) by mouth daily before breakfast., Disp: 30 capsule, Rfl: 5 .  losartan (COZAAR) 25  MG tablet, Take 1 tablet (25 mg total) by mouth daily., Disp: 90 tablet, Rfl: 3 .  megestrol (MEGACE) 40 MG tablet, TAKE 3 TABLETS DAILY BY MOUTH FOR 5 DAYS, 2 TABLETS DAILY FOR 5 DAYS, THEN 1 TABLET DAILY, Disp: 45 tablet, Rfl: 4 .  metFORMIN (GLUCOPHAGE) 500 MG tablet, TAKE 1 TABLET BY MOUTH EVERY DAY WITH BREAKFAST, Disp: 90 tablet, Rfl: 3 .  montelukast (SINGULAIR) 10 MG tablet, Take 1 tablet (10 mg total) by mouth at bedtime., Disp: 90 tablet, Rfl: 3 .  NUCALA 100 MG SOLR, inject 100 mg under the skin  every 4 weeks (to be given by a healthcare provider), Disp: 1 each, Rfl: 10 .  Olopatadine HCl 0.6 % SOLN, USE TWO SPRAYS IN EACH NOSTRIL TWICE DAILY, Disp: 30.5 g, Rfl: 4 .  pantoprazole (PROTONIX) 40 MG tablet, Take 40 mg by mouth every morning., Disp: , Rfl:  .  pravastatin (PRAVACHOL) 40 MG tablet, Take 1 tablet (40 mg total) by mouth daily., Disp: 90 tablet, Rfl: 1 .  progesterone (PROMETRIUM) 200 MG capsule, Take 1 capsule (200 mg total) by mouth at bedtime., Disp: 30 capsule, Rfl: 11 .  spironolactone (ALDACTONE) 50 MG tablet, TAKE 1 TABLET BY MOUTH TWICE DAILY, Disp: 60 tablet, Rfl: 1 .  UNABLE TO FIND, Allergy shots-both arms-weekly, Disp: , Rfl:  .  VANIQA 13.9 % cream, APPLY TO AFFECTED AREA TWICE DAILY, Disp: 45 g, Rfl: 5 .  Scar Treatment Products (CELACYN) GEL, Apply 1 application topically every morning. (Patient not taking: Reported on 10/30/2020), Disp: 28 g, Rfl: 3  Current Facility-Administered Medications:  Marland Kitchen  Mepolizumab SOLR 100 mg, 100 mg, Subcutaneous, Q28 days, Kozlow, Donnamarie Poag, MD, 100 mg at 10/06/20 1402  Objective Blood pressure 124/88, pulse 98, height 5\' 2"  (1.575 m), weight 130 lb (59 kg).  Gen WDWN NAD  Pertinent ROS No burning with urination, frequency or urgency No nausea, vomiting or diarrhea Nor fever chills or other constitutional symptoms   Labs or studies     Impression Diagnoses this Encounter::   ICD-10-CM   1. Perimenopausal DUB, prometrium 21 d then off for 7 days  N92.4   2. Fibroids  D21.9     Established relevant diagnosis(es):   Plan/Recommendations: No orders of the defined types were placed in this encounter.   Labs or Scans Ordered: No orders of the defined types were placed in this encounter.   Management:: Continue current care plan  Follow up Return in about 6 months (around 04/30/2021).        Face to face time:  15 minutes  Greater than 50% of the visit time was spent in counseling and coordination of  care with the patient.  The summary and outline of the counseling and care coordination is summarized in the note above.   All questions were answered.

## 2020-10-31 ENCOUNTER — Ambulatory Visit: Payer: 59 | Admitting: Orthotics

## 2020-10-31 DIAGNOSIS — M797 Fibromyalgia: Secondary | ICD-10-CM

## 2020-10-31 DIAGNOSIS — G5782 Other specified mononeuropathies of left lower limb: Secondary | ICD-10-CM

## 2020-10-31 DIAGNOSIS — G5762 Lesion of plantar nerve, left lower limb: Secondary | ICD-10-CM

## 2020-10-31 DIAGNOSIS — M722 Plantar fascial fibromatosis: Secondary | ICD-10-CM

## 2020-10-31 DIAGNOSIS — D3610 Benign neoplasm of peripheral nerves and autonomic nervous system, unspecified: Secondary | ICD-10-CM

## 2020-10-31 DIAGNOSIS — M76821 Posterior tibial tendinitis, right leg: Secondary | ICD-10-CM

## 2020-10-31 NOTE — Progress Notes (Signed)
Raise arch 1/8"    RIGHT only

## 2020-11-01 ENCOUNTER — Telehealth: Payer: Self-pay | Admitting: *Deleted

## 2020-11-01 ENCOUNTER — Ambulatory Visit (INDEPENDENT_AMBULATORY_CARE_PROVIDER_SITE_OTHER): Payer: 59

## 2020-11-01 ENCOUNTER — Ambulatory Visit: Payer: 59 | Admitting: Dermatology

## 2020-11-01 ENCOUNTER — Other Ambulatory Visit: Payer: Self-pay

## 2020-11-01 DIAGNOSIS — J4550 Severe persistent asthma, uncomplicated: Secondary | ICD-10-CM

## 2020-11-01 DIAGNOSIS — L680 Hirsutism: Secondary | ICD-10-CM

## 2020-11-01 MED ORDER — SPIRONOLACTONE 50 MG PO TABS: 50.0000 mg | ORAL_TABLET | Freq: Two times a day (BID) | ORAL | 5 refills | Status: DC

## 2020-11-01 NOTE — Addendum Note (Signed)
Addended by: Octaviano Glow on: 11/01/2020 08:58 AM   Modules accepted: Orders

## 2020-11-01 NOTE — Telephone Encounter (Signed)
Patient needed refill, refill sent to patient pharmacy

## 2020-11-07 ENCOUNTER — Other Ambulatory Visit: Payer: Self-pay

## 2020-11-07 ENCOUNTER — Ambulatory Visit (INDEPENDENT_AMBULATORY_CARE_PROVIDER_SITE_OTHER): Payer: 59 | Admitting: Allergy and Immunology

## 2020-11-07 ENCOUNTER — Encounter: Payer: Self-pay | Admitting: Allergy and Immunology

## 2020-11-07 VITALS — BP 142/76 | HR 107 | Temp 97.9°F | Resp 16 | Ht 62.0 in | Wt 133.2 lb

## 2020-11-07 DIAGNOSIS — K219 Gastro-esophageal reflux disease without esophagitis: Secondary | ICD-10-CM

## 2020-11-07 DIAGNOSIS — H1010 Acute atopic conjunctivitis, unspecified eye: Secondary | ICD-10-CM | POA: Diagnosis not present

## 2020-11-07 DIAGNOSIS — J301 Allergic rhinitis due to pollen: Secondary | ICD-10-CM

## 2020-11-07 DIAGNOSIS — G500 Trigeminal neuralgia: Secondary | ICD-10-CM

## 2020-11-07 DIAGNOSIS — J4550 Severe persistent asthma, uncomplicated: Secondary | ICD-10-CM | POA: Diagnosis not present

## 2020-11-07 DIAGNOSIS — J3089 Other allergic rhinitis: Secondary | ICD-10-CM | POA: Diagnosis not present

## 2020-11-07 MED ORDER — CYPROHEPTADINE HCL 4 MG PO TABS: ORAL_TABLET | ORAL | 2 refills | Status: DC

## 2020-11-07 MED ORDER — BUDESONIDE 32 MCG/ACT NA SUSP: NASAL | 5 refills | Status: DC

## 2020-11-07 MED ORDER — PANTOPRAZOLE SODIUM 40 MG PO TBEC
40.0000 mg | DELAYED_RELEASE_TABLET | Freq: Two times a day (BID) | ORAL | 5 refills | Status: DC
Start: 2020-11-07 — End: 2021-05-07

## 2020-11-07 NOTE — Patient Instructions (Addendum)
  1. Continue Immunotherapy.  Maintenance dose = 0.2 red vial.    2. Continue to Treat and prevent inflammation:   A. Budesonide - 1-2 sprays each nostril daily  B. Symbicort 80 - 2 inhalations 3-7 times per week  C. montelukast 10 mg - one tablet one time per day  D. Mepolizumab injections   3. Continue to Treat and prevent reflux:   A.  Increase pantoprazole 40 mg twice a day  4. Treat and prevent facial pain syndrome:   A. Periactin 4 mg - 1/2 - 1 tablet at bedtime  5. If needed:   A. Patanase 2 sprays each nostril two times per day  B. OTC antihistamine - Zyrtec 10mg  1-2 times per day (dry eye?)  C. Proventil HFA or similar 2 inhalations every 4-6 hours  D. Epi-Pen  E. Eye therapy with eye doctor  6. Return in 8 weeks or earlier if problem

## 2020-11-07 NOTE — Progress Notes (Signed)
[place] [facility] [place] - [place] - [place]   Follow-up Note  Referring CalvertProvider: Dorene GrebeNicholson, Sterling J I* Primary Provider: Jason CoopNicholson, Sterling J IV, FNP Date of Office Visit: 11/07/2020  Subjective:   Jennifer Mullins (DOB: [birth year]) is a 49 y.o. female who [facility] in re-evaluation of the following:  HPI: Jennifer Mullins returns to this clinic in evaluation of asthma and allergic rhinoconjunctivitis, history of recurrent sinusitis, and history of facial pain syndrome, and history of dry eye syndrome and LPR.  Her last visit to this clinic was 03 October 2020.  During her last visit we attempted to have her use nasal budesonide washes but she developed a side effect from the use of this agent with what sounds like a very irritated burning nose and some head discomfort for which we gave her topical Bactroban for 10 days which cleared up her raw nose.  The big issue for Jennifer Mullins at this point in time is that she still continues to have the sensation of facial pain.  This is a continuous headache but it is also constant pain in her face and sometimes it is associated with ear fullness.  She has had this issue for many many years.  We have treated empirically for chronic sinusitis for this issue and have obtained a CT scan several years back which did not identify any significant abnormality and she also had a repeat CT scan this past year which did not identify any significant abnormality of her airway giving rise to those symptoms.  She does not have any other neurological symptoms with this issue.  Her airway is actually doing pretty well.  She has been using her Symbicort about 4 times per week.  She uses a short acting bronchodilator usually several days before the nadir of her next mepolizumab injection but otherwise has had her asthma under excellent control.   She has been having lots of heartburn especially with spicy foods for which  she must supplement her proton pump inhibitor with Tums.  Allergies as of 11/07/2020      Reactions   Demerol [meperidine] Shortness Of Breath   FLUSHING AND SHORTNESS OF BREATH   Morphine And Related Shortness Of Breath   Flushed and hot hyper Flushed and hot hyper Flushed and hot hyper Flushed and hot hyper   Other Shortness Of Breath   Flushed and hot hyper Flushed and hot hyper Flushed and hot hyper Flushed and hot hyper Flushed and hot hyper   Cardizem  [diltiazem Hcl] Other (See Comments)   Diltiazem Hcl Hives   Gabapentin Other (See Comments)   Chest pain and elevated heart rate   Lisinopril    Caused cough   Lyrica [pregabalin]    Chest pain, elevated heart rate   Tramadol Other (See Comments)   Tears up stomach      Medication List      albuterol 108 (90 Base) MCG/ACT inhaler Commonly known as: VENTOLIN HFA INHALE TWO PUFFS INTO THE LUNGS EVERY 6 HOURS AS NEEDED FOR WHEEZING OR SHORTNESS OF BREATH   ALPRAZolam 0.5 MG tablet Commonly known as: XANAX TAKE 1 TABLET BY MOUTH TWICE DAILY AS NEEDED. MAY TAKE ONE MORE TABLET BEFORE FOR ALLERGY SHOTS ONCE A WEEK (30 DAY supply)   aspirin 81 MG chewable tablet Chew 81 mg by mouth daily.   Auvi-Q 0.3 mg/0.3 mL Soaj injection Generic drug: EPINEPHrine Use for life threatening allergic reactions   budesonide  0.5 MG/2ML nebulizer solution Commonly known as: PULMICORT Use 0.5 ampule in 200 mL saline 2 times daily nasal rinse.   budesonide 32 MCG/ACT nasal spray Commonly known as: RHINOCORT AQUA Place 1 spray into both nostrils daily.   budesonide-formoterol 80-4.5 MCG/ACT inhaler Commonly known as: Symbicort Inhale 2 puffs into the lungs daily.   Celacyn Gel Apply 1 application topically every morning.   cetirizine 10 MG tablet Commonly known as: ZYRTEC Take 1 tablet (10 mg total) by mouth 2 (two) times daily.   cromolyn 4 % ophthalmic solution Commonly known as: OPTICROM Place 1 drop into both eyes 4  (four) times daily.   cycloSPORINE 0.05 % ophthalmic emulsion Commonly known as: RESTASIS Place 1 drop into both eyes 2 (two) times daily.   diclofenac Sodium 1 % Gel Commonly known as: VOLTAREN Apply 2 g topically 4 (four) times daily.   Diethylpropion HCl CR 75 MG Tb24 Take 1 tablet (75 mg total) by mouth daily.   fluticasone 50 MCG/ACT nasal spray Commonly known as: FLONASE USE 1-2 SPRAYS IN EACH NOSTRIL THREE TIMES WEEKLY AS NEEDED   HYDROcodone-acetaminophen 7.5-325 MG tablet Commonly known as: NORCO Take 1 tablet by mouth every 8 (eight) hours as needed.   linaclotide 290 MCG Caps capsule Commonly known as: Linzess Take 1 capsule (290 mcg total) by mouth daily before breakfast.   losartan 25 MG tablet Commonly known as: COZAAR Take 1 tablet (25 mg total) by mouth daily.   megestrol 40 MG tablet Commonly known as: MEGACE TAKE 3 TABLETS DAILY BY MOUTH FOR 5 DAYS, 2 TABLETS DAILY FOR 5 DAYS, THEN 1 TABLET DAILY   metFORMIN 500 MG tablet Commonly known as: GLUCOPHAGE TAKE 1 TABLET BY MOUTH EVERY DAY WITH BREAKFAST   montelukast 10 MG tablet Commonly known as: SINGULAIR Take 1 tablet (10 mg total) by mouth at bedtime.   Nucala 100 MG Solr Generic drug: Mepolizumab Mix and Inject 100mg  subcutaneously every 28 days   Olopatadine HCl 0.6 % Soln USE TWO SPRAYS IN EACH NOSTRIL TWICE DAILY   pantoprazole 40 MG tablet Commonly known as: PROTONIX Take 40 mg by mouth every morning.   pravastatin 40 MG tablet Commonly known as: PRAVACHOL Take 1 tablet (40 mg total) by mouth daily.   progesterone 200 MG capsule Commonly known as: Prometrium Take 1 capsule (200 mg total) by mouth at bedtime.   spironolactone 50 MG tablet Commonly known as: ALDACTONE Take 1 tablet (50 mg total) by mouth 2 (two) times daily.   UNABLE TO FIND Allergy shots-both arms-weekly   Vaniqa 13.9 % cream Generic drug: Eflornithine HCl APPLY TO AFFECTED AREA TWICE DAILY       Past  Medical History:  Diagnosis Date  . Anxiety   . Arthritis   . Asthma   . Bilateral calcaneal spurs   . Bilateral polycystic ovarian syndrome   . Bulging lumbar disc   . Carpal tunnel syndrome, bilateral   . COPD (chronic obstructive pulmonary disease) (HCC)    BRONCHITIS  . Diabetes mellitus without complication ([place])    TYPE 2   DX  4-5 YRS AGO  . ETD (Eustachian tube dysfunction), bilateral    left worse than right  . Fibromyalgia   . GAD (generalized anxiety disorder)   . GERD (gastroesophageal reflux disease)    TAKES PRESCRIPTION MEDS  . Glaucoma    BOTH EYES  . Hyperlipidemia   . Hypertension   . Hypothyroidism    NODULES ON THYROID  . PONV (  postoperative nausea and vomiting)   . Pulmonary embolism ([place])   . Tachycardia   . TMJ (dislocation of temporomandibular joint)   . Trigger finger   . Trigger finger of left thumb     Past Surgical History:  Procedure Laterality Date  . ANTERIOR CERVICAL DECOMP/DISCECTOMY FUSION N/A 11/13/2016   Procedure: Cervical five-six, Cervical six-seven Anterior Cervical Discectomy and Fusion, Allograft, Plate;  Surgeon: Marybelle Killings, MD;  Location: [facility];  Service: Orthopedics;  Laterality: N/A;  . BREAST SURGERY    . CARPAL TUNNEL RELEASE    . DILATION AND CURETTAGE OF UTERUS    . EXPLORATORY LAPAROTOMY    . LIPOMA EXCISION Left 06/15/2018   Procedure: LEFT CALF LIPOMA EXCISION;  Surgeon: Marybelle Killings, MD;  Location: [facility];  Service: Orthopedics;  Laterality: Left;  . NASAL SINUS SURGERY  07/22/13   Dr. Redmond Pulling in [place]  . plate and six screws in back    . TRIGGER FINGER RELEASE Right 06/15/2018   Procedure: RIGHT TRIGGER THUMB RELEASE;  Surgeon: Marybelle Killings, MD;  Location: [facility];  Service: Orthopedics;  Laterality: Right;    Review of systems negative except as noted in HPI / PMHx or noted below:  Review of Systems  Constitutional: Negative.   HENT: Negative.   Eyes: Negative.    Respiratory: Negative.   Cardiovascular: Negative.   Gastrointestinal: Negative.   Genitourinary: Negative.   Musculoskeletal: Negative.   Skin: Negative.   Neurological: Negative.   Endo/Heme/Allergies: Negative.   Psychiatric/Behavioral: Negative.      Objective:   Vitals:   11/07/20 1538  BP: (!) 142/76  Pulse: (!) 107  Resp: 16  Temp: 97.9 F (36.6 C)  SpO2: 98%   Height: 5\' 2"  (157.5 cm)  Weight: 133 lb 3.2 oz (60.4 kg)   Physical Exam Constitutional:      Appearance: She is not diaphoretic.  HENT:     Head: Normocephalic.     Right Ear: Tympanic membrane, ear canal and external ear normal.     Left Ear: Tympanic membrane, ear canal and external ear normal.     Nose: Nose normal. No mucosal edema or rhinorrhea.     Mouth/Throat:     Mouth: Oropharynx is clear and moist and mucous membranes are normal.     Pharynx: Uvula midline. No oropharyngeal exudate.  Eyes:     Conjunctiva/sclera: Conjunctivae normal.  Neck:     Thyroid: No thyromegaly.     Trachea: Trachea normal. No tracheal tenderness or tracheal deviation.  Cardiovascular:     Rate and Rhythm: Normal rate and regular rhythm.     Heart sounds: Normal heart sounds, S1 normal and S2 normal. No murmur heard.   Pulmonary:     Effort: No respiratory distress.     Breath sounds: Normal breath sounds. No stridor. No wheezing or rales.  Musculoskeletal:        General: No edema.  Lymphadenopathy:     Head:     Right side of head: No tonsillar adenopathy.     Left side of head: No tonsillar adenopathy.     Cervical: No cervical adenopathy.  Skin:    Findings: No erythema or rash.     Nails: There is no clubbing.  Neurological:     Mental Status: She is alert.     Diagnostics:    Spirometry was performed and demonstrated an FEV1 of 1.91 at 91 % of predicted.  Results of  a sinus CT scan obtained 11 September 2020 identified the following:  Paranasal sinuses:  Frontal: Normally aerated. Patent  frontal sinus drainage pathways.  Ethmoid: Normally aerated.  Maxillary: Mild mucosal edema in the maxillary sinus bilaterally. No change on the left. Mild progression in the right maxillary sinus mucosal edema. No air-fluid level.  Sphenoid: Normally aerated. Patent sphenoethmoidal recesses.  Mastoid: Well developed and clear bilaterally.  Right ostiomeatal unit: Patent.  Left ostiomeatal unit: Narrowed due to mucosal edema.  Nasal passages: Patent. Intact nasal septum is midline.  Anatomy: Concha bullosa left middle turbinate.  Keros type 2 olfactory recess  Assessment and Plan:   1. Asthma, severe persistent, well-controlled   2. Perennial allergic rhinitis   3. Seasonal allergic rhinitis due to pollen   4. Seasonal allergic conjunctivitis   5. LPRD (laryngopharyngeal reflux disease)   6. Facial pain syndrome     1. Continue Immunotherapy.  Maintenance dose = 0.2 red vial.    2. Continue to Treat and prevent inflammation:   A. Budesonide - 1-2 sprays each nostril daily  B. Symbicort 80 - 2 inhalations 3-7 times per week  C. montelukast 10 mg - one tablet one time per day  D. Mepolizumab injections   3. Continue to Treat and prevent reflux:   A.  Increase pantoprazole 40 mg twice a day  4. Treat and prevent facial pain syndrome:   A. Periactin 4 mg - 1/2 - 1 tablet at bedtime  5. If needed:   A. Patanase 2 sprays each nostril two times per day  B. OTC antihistamine - Zyrtec 10mg  1-2 times per day (dry eye?)  C. Proventil HFA or similar 2 inhalations every 4-6 hours  D. Epi-Pen  E. Eye therapy with eye doctor  6. Return in 8 weeks or earlier if problem  Elvera appears to have 2 active issues.  She still has this facial pain syndrome which may be tied up to some degree with her atopic disease but given the fact that she is utilizing such a large collection of medical therapy directed against atopic disease I think we need to also consider the fact  that she has an independent facial pain syndrome and treat her with Periactin at bedtime.  As well her reflux is very active and we will increase her pantoprazole as noted above.  She will continue on a large collection of anti-inflammatory agents for airway including the use of mepolizumab and immunotherapy and I will see her back in his clinic in 8 weeks or earlier if there is a problem.  Allena Katz, MD Allergy / Immunology [place]

## 2020-11-08 ENCOUNTER — Encounter: Payer: Self-pay | Admitting: Allergy and Immunology

## 2020-11-14 ENCOUNTER — Ambulatory Visit (INDEPENDENT_AMBULATORY_CARE_PROVIDER_SITE_OTHER): Payer: 59 | Admitting: *Deleted

## 2020-11-14 ENCOUNTER — Other Ambulatory Visit: Payer: Self-pay

## 2020-11-14 ENCOUNTER — Ambulatory Visit
Admission: RE | Admit: 2020-11-14 | Discharge: 2020-11-14 | Disposition: A | Discharge status: Home / Self Care | Payer: 59 | Source: Ambulatory Visit | Attending: Obstetrics & Gynecology | Admitting: Obstetrics & Gynecology

## 2020-11-14 DIAGNOSIS — Z1231 Encounter for screening mammogram for malignant neoplasm of breast: Secondary | ICD-10-CM

## 2020-11-14 DIAGNOSIS — J309 Allergic rhinitis, unspecified: Secondary | ICD-10-CM | POA: Diagnosis not present

## 2020-11-21 ENCOUNTER — Other Ambulatory Visit: Payer: Self-pay | Admitting: *Deleted

## 2020-11-21 MED ORDER — NUCALA 100 MG ~~LOC~~ SOLR: 100.0000 mg | SUBCUTANEOUS | 11 refills | Status: DC

## 2020-11-24 ENCOUNTER — Other Ambulatory Visit: Payer: Self-pay | Admitting: Allergy and Immunology

## 2020-11-27 ENCOUNTER — Ambulatory Visit: Payer: 59 | Admitting: Orthotics

## 2020-11-27 ENCOUNTER — Ambulatory Visit (INDEPENDENT_AMBULATORY_CARE_PROVIDER_SITE_OTHER): Payer: 59 | Admitting: *Deleted

## 2020-11-27 ENCOUNTER — Other Ambulatory Visit: Payer: Self-pay

## 2020-11-27 DIAGNOSIS — J4550 Severe persistent asthma, uncomplicated: Secondary | ICD-10-CM

## 2020-11-27 DIAGNOSIS — M76821 Posterior tibial tendinitis, right leg: Secondary | ICD-10-CM

## 2020-11-27 DIAGNOSIS — M722 Plantar fascial fibromatosis: Secondary | ICD-10-CM

## 2020-11-27 NOTE — Progress Notes (Signed)
Picked up recorrected inserts RIGHT only (1/8 scaphoid pad)..Also, added 3* RF varus post

## 2020-11-28 ENCOUNTER — Ambulatory Visit: Payer: Self-pay

## 2020-11-28 ENCOUNTER — Ambulatory Visit: Payer: 59 | Admitting: Allergy and Immunology

## 2020-11-28 ENCOUNTER — Encounter: Payer: 59 | Admitting: Orthotics

## 2020-11-28 ENCOUNTER — Ambulatory Visit: Payer: 59

## 2020-11-29 ENCOUNTER — Ambulatory Visit: Payer: Self-pay

## 2020-11-29 ENCOUNTER — Ambulatory Visit (INDEPENDENT_AMBULATORY_CARE_PROVIDER_SITE_OTHER): Payer: 59 | Admitting: *Deleted

## 2020-11-29 DIAGNOSIS — J309 Allergic rhinitis, unspecified: Secondary | ICD-10-CM

## 2020-12-05 ENCOUNTER — Encounter: Payer: Self-pay | Admitting: Allergy and Immunology

## 2020-12-05 ENCOUNTER — Telehealth: Payer: Self-pay

## 2020-12-05 ENCOUNTER — Ambulatory Visit: Payer: 59 | Admitting: Allergy and Immunology

## 2020-12-05 ENCOUNTER — Other Ambulatory Visit: Payer: Self-pay

## 2020-12-05 ENCOUNTER — Ambulatory Visit (INDEPENDENT_AMBULATORY_CARE_PROVIDER_SITE_OTHER): Payer: 59 | Admitting: Allergy and Immunology

## 2020-12-05 VITALS — BP 110/80 | HR 87 | Temp 98.0°F | Resp 18 | Ht 62.0 in | Wt 131.4 lb

## 2020-12-05 DIAGNOSIS — K219 Gastro-esophageal reflux disease without esophagitis: Secondary | ICD-10-CM

## 2020-12-05 DIAGNOSIS — J301 Allergic rhinitis due to pollen: Secondary | ICD-10-CM | POA: Diagnosis not present

## 2020-12-05 DIAGNOSIS — J3089 Other allergic rhinitis: Secondary | ICD-10-CM | POA: Diagnosis not present

## 2020-12-05 DIAGNOSIS — H1010 Acute atopic conjunctivitis, unspecified eye: Secondary | ICD-10-CM

## 2020-12-05 DIAGNOSIS — G500 Trigeminal neuralgia: Secondary | ICD-10-CM

## 2020-12-05 DIAGNOSIS — J4550 Severe persistent asthma, uncomplicated: Secondary | ICD-10-CM

## 2020-12-05 MED ORDER — CYPROHEPTADINE HCL 4 MG PO TABS: ORAL_TABLET | ORAL | 5 refills | Status: DC

## 2020-12-05 NOTE — Progress Notes (Signed)
[place]   Follow-up Note  Referring Provider: Hector Brunswick* Primary Provider: Wannetta Sender, FNP Date of Office Visit: 12/05/2020  Subjective:   BURNADETTE BASKETT (DOB: [birth year]) is a 50 y.o. female who returns to the [facility] on 12/05/2020 in re-evaluation of the following:  HPI: Ivorie returns to this clinic in evaluation of asthma and allergic rhinoconjunctivitis and history of recurrent sinusitis and history of facial pain syndrome and history of dry eye syndrome and LPR.  Her last visit to this clinic was 07 November 2020.  Her airway issue is going quite well at this point in time other than the fact that she gets nasal congestion upon awakening in the morning for which she will use her nasal budesonide and nasal Patanase which appears to help that issue.  She also has intermittent ugly nasal discharge which has been a longstanding issue.  She had no issues with her asthma and does not use a short acting bronchodilator and overall feels as though her lower airway has been doing well.  She still continues to have thumping facial pain headaches.  I did ask her today about whether or not there is any apnea at night as her husband accompanied her to the visit today and that does not appear to be the issue.  She is not sleepy during the daytime.  She has never had a sleep study.  We did start her on 2 mg of Periactin during her last visit which has not really helped this issue.  During her last visit we increased her dose of proton pump inhibitor because she was having lots of heartburn and that is much better at this point.  Allergies as of 12/05/2020      Reactions   Demerol [meperidine] Shortness Of Breath   FLUSHING AND SHORTNESS OF BREATH   Morphine And Related Shortness Of Breath   Flushed and hot hyper Flushed and hot hyper Flushed and hot hyper Flushed and hot hyper   Other Shortness Of  Breath   Flushed and hot hyper Flushed and hot hyper Flushed and hot hyper Flushed and hot hyper Flushed and hot hyper   Cardizem  [diltiazem Hcl] Other (See Comments)   Diltiazem Hcl Hives   Gabapentin Other (See Comments)   Chest pain and elevated heart rate   Lisinopril    Caused cough   Lyrica [pregabalin]    Chest pain, elevated heart rate   Tramadol Other (See Comments)   Tears up stomach      Medication List       Accurate as of December 05, 2020  3:35 PM. If you have any questions, ask your nurse or doctor.        albuterol 108 (90 Base) MCG/ACT inhaler Commonly known as: VENTOLIN HFA INHALE TWO PUFFS INTO THE LUNGS EVERY 6 HOURS AS NEEDED FOR WHEEZING OR SHORTNESS OF BREATH   ALPRAZolam 0.5 MG tablet Commonly known as: XANAX TAKE 1 TABLET BY MOUTH TWICE DAILY AS NEEDED. MAY TAKE ONE MORE TABLET BEFORE FOR ALLERGY SHOTS ONCE A WEEK (30 DAY supply)   aspirin 81 MG chewable tablet Chew 81 mg by mouth daily.   Auvi-Q 0.3 mg/0.3 mL Soaj injection Generic drug: EPINEPHrine Use for life threatening allergic reactions   budesonide 32 MCG/ACT nasal spray Commonly known as: RHINOCORT AQUA 1-2 spray in each nostril daily   budesonide-formoterol 80-4.5 MCG/ACT inhaler Commonly known as: Symbicort  Inhale 2 puffs into the lungs daily.   Celacyn Gel Apply 1 application topically every morning.   cetirizine 10 MG tablet Commonly known as: ZYRTEC Take 1 tablet (10 mg total) by mouth 2 (two) times daily.   cromolyn 4 % ophthalmic solution Commonly known as: OPTICROM Place 1 drop into both eyes 4 (four) times daily.   cycloSPORINE 0.05 % ophthalmic emulsion Commonly known as: RESTASIS Place 1 drop into both eyes 2 (two) times daily.   cyproheptadine 4 MG tablet Commonly known as: PERIACTIN Take 1/2 tablet at bedtime   diclofenac Sodium 1 % Gel Commonly known as: VOLTAREN Apply 2 g topically 4 (four) times daily.   Diethylpropion HCl CR 75 MG Tb24 Take 1  tablet (75 mg total) by mouth daily.   fluticasone 50 MCG/ACT nasal spray Commonly known as: FLONASE USE 1-2 SPRAYS IN EACH NOSTRIL THREE TIMES WEEKLY AS NEEDED   HYDROcodone-acetaminophen 7.5-325 MG tablet Commonly known as: NORCO Take 1 tablet by mouth every 8 (eight) hours as needed.   linaclotide 290 MCG Caps capsule Commonly known as: Linzess Take 1 capsule (290 mcg total) by mouth daily before breakfast.   losartan 25 MG tablet Commonly known as: COZAAR Take 1 tablet (25 mg total) by mouth daily.   megestrol 40 MG tablet Commonly known as: MEGACE TAKE 3 TABLETS DAILY BY MOUTH FOR 5 DAYS, 2 TABLETS DAILY FOR 5 DAYS, THEN 1 TABLET DAILY   metFORMIN 500 MG tablet Commonly known as: GLUCOPHAGE TAKE 1 TABLET BY MOUTH EVERY DAY WITH BREAKFAST   montelukast 10 MG tablet Commonly known as: SINGULAIR TAKE 1 TABLET BY MOUTH AT BEDTIME   Nucala 100 MG Solr Generic drug: Mepolizumab Mix and Inject 100mg  subcutaneously every 28 days   Nucala 100 MG Solr Generic drug: Mepolizumab Inject 100 mg into the skin every 28 (twenty-eight) days.   Olopatadine HCl 0.6 % Soln USE TWO SPRAYS IN EACH NOSTRIL TWICE DAILY   pantoprazole 40 MG tablet Commonly known as: PROTONIX Take 1 tablet (40 mg total) by mouth 2 (two) times daily.   pravastatin 40 MG tablet Commonly known as: PRAVACHOL Take 1 tablet (40 mg total) by mouth daily.   progesterone 200 MG capsule Commonly known as: Prometrium Take 1 capsule (200 mg total) by mouth at bedtime.   spironolactone 50 MG tablet Commonly known as: ALDACTONE Take 1 tablet (50 mg total) by mouth 2 (two) times daily.   UNABLE TO FIND Allergy shots-both arms-weekly   Vaniqa 13.9 % cream Generic drug: Eflornithine HCl APPLY TO AFFECTED AREA TWICE DAILY       Past Medical History:  Diagnosis Date  . Anxiety   . Arthritis   . Asthma   . Bilateral calcaneal spurs   . Bilateral polycystic ovarian syndrome   . Bulging lumbar disc   .  Carpal tunnel syndrome, bilateral   . COPD (chronic obstructive pulmonary disease) (HCC)    BRONCHITIS  . Diabetes mellitus without complication ([place])    TYPE 2   DX  4-5 YRS AGO  . ETD (Eustachian tube dysfunction), bilateral    left worse than right  . Fibromyalgia   . GAD (generalized anxiety disorder)   . GERD (gastroesophageal reflux disease)    TAKES PRESCRIPTION MEDS  . Glaucoma    BOTH EYES  . Hyperlipidemia   . Hypertension   . Hypothyroidism    NODULES ON THYROID  . PONV (postoperative nausea and vomiting)   . Pulmonary embolism ([place])   .  Tachycardia   . TMJ (dislocation of temporomandibular joint)   . Trigger finger   . Trigger finger of left thumb     Past Surgical History:  Procedure Laterality Date  . ANTERIOR CERVICAL DECOMP/DISCECTOMY FUSION N/A 11/13/2016   Procedure: Cervical five-six, Cervical six-seven Anterior Cervical Discectomy and Fusion, Allograft, Plate;  Surgeon: Marybelle Killings, MD;  Location: [facility];  Service: Orthopedics;  Laterality: N/A;  . BREAST SURGERY    . CARPAL TUNNEL RELEASE    . DILATION AND CURETTAGE OF UTERUS    . EXPLORATORY LAPAROTOMY    . LIPOMA EXCISION Left 06/15/2018   Procedure: LEFT CALF LIPOMA EXCISION;  Surgeon: Marybelle Killings, MD;  Location: [facility];  Service: Orthopedics;  Laterality: Left;  . NASAL SINUS SURGERY  07/22/13   Dr. Redmond Pulling in [place]  . plate and six screws in back    . REDUCTION MAMMAPLASTY Bilateral 2000  . TRIGGER FINGER RELEASE Right 06/15/2018   Procedure: RIGHT TRIGGER THUMB RELEASE;  Surgeon: Marybelle Killings, MD;  Location: [facility];  Service: Orthopedics;  Laterality: Right;    Review of systems negative except as noted in HPI / PMHx or noted below:  Review of Systems  Constitutional: Negative.   HENT: Negative.   Eyes: Negative.   Respiratory: Negative.   Cardiovascular: Negative.   Gastrointestinal: Negative.   Genitourinary: Negative.   Musculoskeletal: Negative.    Skin: Negative.   Neurological: Negative.   Endo/Heme/Allergies: Negative.   Psychiatric/Behavioral: Negative.      Objective:   Vitals:   12/05/20 1513  BP: 110/80  Pulse: 87  Resp: 18  Temp: 98 F (36.7 C)  SpO2: 99%   Height: 5\' 2"  (157.5 cm)  Weight: 131 lb 6.4 oz (59.6 kg)   Physical Exam Constitutional:      Appearance: She is not diaphoretic.  HENT:     Head: Normocephalic.     Right Ear: Tympanic membrane, ear canal and external ear normal.     Left Ear: Tympanic membrane, ear canal and external ear normal.     Nose: Nose normal. No mucosal edema or rhinorrhea.     Mouth/Throat:     Mouth: Oropharynx is clear and moist and mucous membranes are normal.     Pharynx: Uvula midline. No oropharyngeal exudate.  Eyes:     Conjunctiva/sclera: Conjunctivae normal.  Neck:     Thyroid: No thyromegaly.     Trachea: Trachea normal. No tracheal tenderness or tracheal deviation.  Cardiovascular:     Rate and Rhythm: Normal rate and regular rhythm.     Heart sounds: Normal heart sounds, S1 normal and S2 normal. No murmur heard.   Pulmonary:     Effort: No respiratory distress.     Breath sounds: Normal breath sounds. No stridor. No wheezing or rales.  Musculoskeletal:        General: No edema.  Lymphadenopathy:     Head:     Right side of head: No tonsillar adenopathy.     Left side of head: No tonsillar adenopathy.     Cervical: No cervical adenopathy.  Skin:    Findings: No erythema or rash.     Nails: There is no clubbing.  Neurological:     Mental Status: She is alert.     Diagnostics:   The patient had an Asthma Control Test with the following results: ACT Total Score: 20.    Assessment and Plan:   1. Asthma, severe persistent,  well-controlled   2. Perennial allergic rhinitis   3. Seasonal allergic rhinitis due to pollen   4. Seasonal allergic conjunctivitis   5. Facial pain syndrome   6. LPRD (laryngopharyngeal reflux disease)     1. Continue  Immunotherapy.  Maintenance dose = 0.2 red vial.    2. Continue to Treat and prevent inflammation:   A. Budesonide - 1-2 sprays each nostril daily  B. Symbicort 80 - 2 inhalations 3-7 times per week  C. montelukast 10 mg - one tablet one time per day  D. Mepolizumab injections   3. Continue to Treat and prevent reflux:   A.  Pantoprazole 40 mg twice a day  4. Treat and prevent facial pain syndrome:   A. Increase Periactin 4 mg - 1 (4mg ) - 1+1/2 (6mg ) - 2 (8 mg)  tablet at bedtime  5. If needed:   A. Patanase 2 sprays each nostril two times per day  B. OTC antihistamine - Zyrtec 10mg  1-2 times per day (dry eye?)  C. Proventil HFA or similar 2 inhalations every 4-6 hours  D. Epi-Pen  E. Eye therapy with eye doctor  6. [facility] visit for rhinitis / sinusitis  7. Sleep study for headache???  8. Return in 8 weeks or earlier if problem  Jodi appears to still have problems with her airway in the face of using anti-inflammatory medications including the use of mepolizumab.  She is convinced that she has "sinus" and this is the cause of her facial pain headache syndrome.  We will refer her on to [facility] department for further evaluation of this issue.  I am going to increase her Periactin as a preventative directed against this facial pain headache syndrome and she can slowly escalate the dose of Periactin by 2 mg every 2 weeks up to a total of 8 mg attempting to find a dose that helps this issue.  With her chronic headaches there is the possibility that this may be precipitated by sleep dysfunction and possible sleep apnea although since there does not appear to be any significant sleepiness during the daytime.  If she still continues to have significant headaches in the face of the plan noted above then we need to obtain a sleep study to exclude sleep apnea.  Allena Katz, MD Allergy / Immunology [place]

## 2020-12-05 NOTE — Telephone Encounter (Signed)
Patient needs to be referred to [facility] ENT for rhinitis / sinusitis

## 2020-12-05 NOTE — Patient Instructions (Addendum)
  1. Continue Immunotherapy.  Maintenance dose = 0.2 red vial.    2. Continue to Treat and prevent inflammation:   A. Budesonide - 1-2 sprays each nostril daily  B. Symbicort 80 - 2 inhalations 3-7 times per week  C. montelukast 10 mg - one tablet one time per day  D. Mepolizumab injections   3. Continue to Treat and prevent reflux:   A.  Pantoprazole 40 mg twice a day  4. Treat and prevent facial pain syndrome:   A. Increase Periactin 4 mg - 1 (4mg ) - 1+1/2 (6mg ) - 2 (8 mg)  tablet at bedtime  5. If needed:   A. Patanase 2 sprays each nostril two times per day  B. OTC antihistamine - Zyrtec 10mg  1-2 times per day (dry eye?)  C. Proventil HFA or similar 2 inhalations every 4-6 hours  D. Epi-Pen  E. Eye therapy with eye doctor  6. [facility] ENT visit for rhinitis / sinusitis  7. Sleep study for headache???  8. Return in 8 weeks or earlier if problem

## 2020-12-06 ENCOUNTER — Encounter: Payer: Self-pay | Admitting: Allergy and Immunology

## 2020-12-06 NOTE — Addendum Note (Signed)
Addended by: Clovis Cao A on: 12/06/2020 02:06 PM   Modules accepted: Orders

## 2020-12-12 ENCOUNTER — Ambulatory Visit (INDEPENDENT_AMBULATORY_CARE_PROVIDER_SITE_OTHER): Payer: 59

## 2020-12-12 DIAGNOSIS — J309 Allergic rhinitis, unspecified: Secondary | ICD-10-CM | POA: Diagnosis not present

## 2020-12-13 NOTE — Telephone Encounter (Signed)
I called Jennifer Mullins to make sure we were on the same page regarding the referral to [facility]. Patient is wanting to come in and see Dr Neldon Mc on next Tuesday to go over some follow up questions. She is having issues with her allergy injections and sinus congestion every time she gets her allergy shot. Patient is wanting to hold off on the referral for [facility] till she talks to Dr Neldon Mc again on next week first. So the patient has been taking one @ night time. Patient is going to try taking Periactin 1 & 1/2 dose instead of 2 as she took 2 at night and it made her feel horrible. I have also taken the patients PCP out the system so they can not receive any of her office visit notes per patient request.  Dr Neldon Mc do you have any more recommendations for the sinus congestion and pressure in her face?

## 2020-12-19 ENCOUNTER — Ambulatory Visit: Payer: 59 | Admitting: Podiatry

## 2020-12-19 ENCOUNTER — Ambulatory Visit: Payer: 59 | Admitting: Allergy and Immunology

## 2020-12-25 ENCOUNTER — Ambulatory Visit: Payer: Self-pay

## 2020-12-26 ENCOUNTER — Ambulatory Visit (INDEPENDENT_AMBULATORY_CARE_PROVIDER_SITE_OTHER): Payer: 59 | Admitting: *Deleted

## 2020-12-26 ENCOUNTER — Other Ambulatory Visit: Payer: Self-pay

## 2020-12-26 DIAGNOSIS — J4550 Severe persistent asthma, uncomplicated: Secondary | ICD-10-CM

## 2020-12-28 ENCOUNTER — Ambulatory Visit (INDEPENDENT_AMBULATORY_CARE_PROVIDER_SITE_OTHER): Payer: 59

## 2020-12-28 ENCOUNTER — Ambulatory Visit (INDEPENDENT_AMBULATORY_CARE_PROVIDER_SITE_OTHER): Payer: 59 | Admitting: Podiatry

## 2020-12-28 ENCOUNTER — Other Ambulatory Visit: Payer: Self-pay

## 2020-12-28 ENCOUNTER — Encounter: Payer: Self-pay | Admitting: Podiatry

## 2020-12-28 DIAGNOSIS — G5762 Lesion of plantar nerve, left lower limb: Secondary | ICD-10-CM

## 2020-12-28 DIAGNOSIS — J309 Allergic rhinitis, unspecified: Secondary | ICD-10-CM

## 2020-12-28 DIAGNOSIS — M76821 Posterior tibial tendinitis, right leg: Secondary | ICD-10-CM | POA: Diagnosis not present

## 2020-12-28 DIAGNOSIS — M722 Plantar fascial fibromatosis: Secondary | ICD-10-CM

## 2020-12-28 DIAGNOSIS — E1169 Type 2 diabetes mellitus with other specified complication: Secondary | ICD-10-CM

## 2020-12-28 DIAGNOSIS — M7730 Calcaneal spur, unspecified foot: Secondary | ICD-10-CM

## 2020-12-28 DIAGNOSIS — M19071 Primary osteoarthritis, right ankle and foot: Secondary | ICD-10-CM

## 2020-12-28 DIAGNOSIS — M797 Fibromyalgia: Secondary | ICD-10-CM

## 2020-12-28 DIAGNOSIS — M19072 Primary osteoarthritis, left ankle and foot: Secondary | ICD-10-CM

## 2020-12-28 DIAGNOSIS — G5782 Other specified mononeuropathies of left lower limb: Secondary | ICD-10-CM

## 2020-12-30 NOTE — Progress Notes (Addendum)
She presents today for follow-up of her posterior tibial tendinitis of her right foot.  States that the orthotics are different and they do not feel well.  Feels like they are not holding my foot far enough over.  She states now that they have been messed with so many times the left foot is not sitting correctly either and she is also having problems with her right first metatarsophalangeal joint blaming the arthritis that is present on her newest orthotic which was only picked up a month or so ago.  Objective: Vital signs are stable alert and oriented x3.  Pulses are palpable.  There is no erythema edema cellulitis drainage or odor pes planus with tenderness on palpation of the posterior tibial tendon right she has tenderness on range of motion of the first metatarsal phalangeal joint right.  No pain on palpation of the left foot at all.  I have her stand and her orthotics in her tennis shoes.  The orthotic appears to hold the right foot and a subtalar joint neutral with the calcaneus and the leg in perfect alignment.  She states that if still feels like it is too low.  She places on wedge underneath the orthotic which then inverts the subtalar joint in a varus position and the patient states that that feels much better.  She states that it does not feel unstable to her lateral ankle.  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  She also has diabetes mellitus.  Plan: Obviously this is important to get his orthotics right so were going to have Rick redo her orthotics.  We cannot have more pressure in particular areas because of her diabetes mellitus as well as the tension placed on the plantar fascia as well as the posterior tibial tendon.  I feel that a long extension of the cover will help with the first metatarsophalangeal joint arthritis.

## 2021-01-01 ENCOUNTER — Ambulatory Visit (INDEPENDENT_AMBULATORY_CARE_PROVIDER_SITE_OTHER): Payer: 59 | Admitting: *Deleted

## 2021-01-01 DIAGNOSIS — J309 Allergic rhinitis, unspecified: Secondary | ICD-10-CM

## 2021-01-01 NOTE — Progress Notes (Signed)
VIALS EXP 01-01-22 

## 2021-01-02 DIAGNOSIS — J301 Allergic rhinitis due to pollen: Secondary | ICD-10-CM

## 2021-01-08 ENCOUNTER — Telehealth: Payer: Self-pay | Admitting: Allergy and Immunology

## 2021-01-08 NOTE — Telephone Encounter (Signed)
Jennifer Mullins called to cancel her appointment with you for tomorrow 01/09/21. She said she and an emergency that came up. She wanted to reschedule, but you do not have anything until 02/20/21. I offered a Nurse Practitioner and she said she only wanted to see you. It was about her blood pressure. She said she cannot get it to come up to an acceptable level. She has been keeping a journal of all her readings. She is requesting to be fit in on 01/16/21 late afternoon or 01/23/21 after her Nucala shot. Is there a time to work her in on either of those days? She only wants afternoons.

## 2021-01-09 ENCOUNTER — Ambulatory Visit: Payer: 59 | Admitting: Allergy and Immunology

## 2021-01-09 NOTE — Telephone Encounter (Signed)
Patient called back.  I put her in for 01/23/2021  @ 1:30. She gets her nucala shot @ 2:00 the same day. Patient wants [place] to give her a call if we have any afternoon cancellations for 01/16/2021.   Thanks

## 2021-01-17 ENCOUNTER — Ambulatory Visit: Payer: 59 | Admitting: Dermatology

## 2021-01-18 ENCOUNTER — Ambulatory Visit (INDEPENDENT_AMBULATORY_CARE_PROVIDER_SITE_OTHER): Payer: 59

## 2021-01-18 DIAGNOSIS — J309 Allergic rhinitis, unspecified: Secondary | ICD-10-CM | POA: Diagnosis not present

## 2021-01-22 ENCOUNTER — Telehealth: Payer: Self-pay | Admitting: Podiatry

## 2021-01-22 NOTE — Telephone Encounter (Signed)
Pt called and left message Friday afternoon 3.4.2022 asking if I had heard back from [employer] regarding the prior auth from [employer].  I returned call and explained that I have not heard back from [employer] nor am I able to get in touch with them I have tried several times and they are not accepting calls from providers offices to verify benefits.

## 2021-01-23 ENCOUNTER — Ambulatory Visit (INDEPENDENT_AMBULATORY_CARE_PROVIDER_SITE_OTHER): Payer: 59 | Admitting: Allergy and Immunology

## 2021-01-23 ENCOUNTER — Encounter: Payer: Self-pay | Admitting: Allergy and Immunology

## 2021-01-23 ENCOUNTER — Other Ambulatory Visit: Payer: Self-pay

## 2021-01-23 ENCOUNTER — Ambulatory Visit (INDEPENDENT_AMBULATORY_CARE_PROVIDER_SITE_OTHER): Payer: 59 | Admitting: *Deleted

## 2021-01-23 VITALS — BP 116/74 | HR 88 | Temp 98.4°F | Resp 16

## 2021-01-23 DIAGNOSIS — J4550 Severe persistent asthma, uncomplicated: Secondary | ICD-10-CM | POA: Diagnosis not present

## 2021-01-23 DIAGNOSIS — G500 Trigeminal neuralgia: Secondary | ICD-10-CM

## 2021-01-23 DIAGNOSIS — G4720 Circadian rhythm sleep disorder, unspecified type: Secondary | ICD-10-CM

## 2021-01-23 DIAGNOSIS — J3089 Other allergic rhinitis: Secondary | ICD-10-CM

## 2021-01-23 DIAGNOSIS — H1044 Vernal conjunctivitis: Secondary | ICD-10-CM

## 2021-01-23 DIAGNOSIS — J301 Allergic rhinitis due to pollen: Secondary | ICD-10-CM | POA: Diagnosis not present

## 2021-01-23 DIAGNOSIS — H1013 Acute atopic conjunctivitis, bilateral: Secondary | ICD-10-CM | POA: Diagnosis not present

## 2021-01-23 DIAGNOSIS — K219 Gastro-esophageal reflux disease without esophagitis: Secondary | ICD-10-CM

## 2021-01-23 DIAGNOSIS — H1010 Acute atopic conjunctivitis, unspecified eye: Secondary | ICD-10-CM

## 2021-01-23 MED ORDER — BUDESONIDE 32 MCG/ACT NA SUSP: NASAL | 5 refills | Status: DC

## 2021-01-23 NOTE — Patient Instructions (Addendum)
  1. Continue Immunotherapy.  Maintenance dose = 0.2 red vial.    2. Continue to Treat and prevent inflammation:   A. Budesonide - 1-2 sprays each nostril daily  B. Symbicort 80 - 2 inhalations 3-7 times per week  C. montelukast 10 mg - one tablet one time per day  D. Mepolizumab injections   3. Continue to Treat and prevent reflux:   A.  Pantoprazole 40 mg twice a day  4. Treat and prevent facial pain syndrome:   A. Periactin 4 mg - 1/2 - 1 tablet at bedtime  5. If needed:   A. Patanase 2 sprays each nostril two times per day  B. OTC antihistamine - Zyrtec 10mg  1-2 times per day (dry eye?)  C. Proventil HFA or similar 2 inhalations every 4-6 hours  D. Epi-Pen  E. Eye therapy with eye doctor  6. Return to clinic in 6 months or earlier if problem

## 2021-01-23 NOTE — Progress Notes (Signed)
[facility] [place] [facility] [place]   Follow-up Note   Referring Provider: No ref. provider found Primary Provider: Patient, No Pcp Per Date of Office Visit: 01/23/2021  Subjective:   Jennifer Mullins (DOB: 1971/10/31) is a 50 y.o. female who returns to the [facility] [place] on 01/23/2021 in re-evaluation of the following:  HPI: [name] returns to this clinic in evaluation of asthma and allergic rhinoconjunctivitis and history of recurrent acute sinusitis and history of facial pain syndrome and history of dry eye syndrome and LPR.  Her last visit to this clinic was 05 December 2020.  Her airway is doing relatively well at this point in time.  It does not sound as though she uses a short acting bronchodilator on a common basis and she can exert herself without any problem.  She continues to utilize mepolizumab and Symbicort on a regular basis.  It sounds as though her nose is doing relatively well.  She continues on montelukast and a nasal steroid.  Sometimes she gets a little bit stuffy and a little bit of runny nose but overall has done relatively well.  Her facial pain syndrome is better while using Periactin.  Her sleep is also better while using Periactin.  She uses 1/2 to 1 tablet of 4 mg every night.  She believes that since she has been using Periactin her blood pressure is somewhat low.  She was on losartan and she discontinued that agent but it should be noted that she is still on spironolactone twice a day.  Her reflux is under pretty good control at this point in time on her current therapy.  She continues on immunotherapy currently frozen at 0.2 mL red vial as she has had difficulty extending past that volume in the past.  Allergies as of 01/23/2021      Reactions   Demerol [meperidine] Shortness Of Breath   FLUSHING AND SHORTNESS OF BREATH   Morphine And Related Shortness Of Breath   Flushed and hot hyper Flushed and hot hyper Flushed and hot  hyper Flushed and hot hyper   Other Shortness Of Breath   Flushed and hot hyper Flushed and hot hyper Flushed and hot hyper Flushed and hot hyper Flushed and hot hyper   Cardizem  [diltiazem Hcl] Other (See Comments)   Diltiazem Hcl Hives   Gabapentin Other (See Comments)   Chest pain and elevated heart rate   Lisinopril    Caused cough   Lyrica [pregabalin]    Chest pain, elevated heart rate   Tramadol Other (See Comments)   Tears up stomach      Medication List      albuterol 108 (90 Base) MCG/ACT inhaler Commonly known as: VENTOLIN HFA INHALE TWO PUFFS INTO THE LUNGS EVERY 6 HOURS AS NEEDED FOR WHEEZING OR SHORTNESS OF BREATH   ALPRAZolam 0.5 MG tablet Commonly known as: XANAX TAKE 1 TABLET BY MOUTH TWICE DAILY AS NEEDED. MAY TAKE ONE MORE TABLET BEFORE FOR ALLERGY SHOTS ONCE A WEEK (30 DAY supply)   aspirin 81 MG chewable tablet Chew 81 mg by mouth daily.   Auvi-Q 0.3 mg/0.3 mL Soaj injection Generic drug: EPINEPHrine Use for life threatening allergic reactions   budesonide 32 MCG/ACT nasal spray Commonly known as: RHINOCORT AQUA 1-2 spray in each nostril daily   budesonide-formoterol 80-4.5 MCG/ACT inhaler Commonly known as: Symbicort Inhale 2 puffs into the lungs daily.   Celacyn Gel Apply 1 application topically every morning.  cetirizine 10 MG tablet Commonly known as: ZYRTEC Take 1 tablet (10 mg total) by mouth 2 (two) times daily.   cromolyn 4 % ophthalmic solution Commonly known as: OPTICROM Place 1 drop into both eyes 4 (four) times daily.   cycloSPORINE 0.05 % ophthalmic emulsion Commonly known as: RESTASIS Place 1 drop into both eyes 2 (two) times daily.   cyproheptadine 4 MG tablet Commonly known as: PERIACTIN Take one and a half tablet in the morning and 2 tablets at night   diclofenac Sodium 1 % Gel Commonly known as: VOLTAREN Apply 2 g topically 4 (four) times daily.   Diethylpropion HCl CR 75 MG Tb24 Take 1 tablet (75 mg total)  by mouth daily.   fluticasone 50 MCG/ACT nasal spray Commonly known as: FLONASE USE 1-2 SPRAYS IN EACH NOSTRIL THREE TIMES WEEKLY AS NEEDED   HYDROcodone-acetaminophen 7.5-325 MG tablet Commonly known as: NORCO Take 1 tablet by mouth every 8 (eight) hours as needed.   linaclotide 290 MCG Caps capsule Commonly known as: Linzess Take 1 capsule (290 mcg total) by mouth daily before breakfast.   losartan 25 MG tablet Commonly known as: COZAAR Take 1 tablet (25 mg total) by mouth daily.   megestrol 40 MG tablet Commonly known as: MEGACE TAKE 3 TABLETS DAILY BY MOUTH FOR 5 DAYS, 2 TABLETS DAILY FOR 5 DAYS, THEN 1 TABLET DAILY   metFORMIN 500 MG tablet Commonly known as: GLUCOPHAGE TAKE 1 TABLET BY MOUTH EVERY DAY WITH BREAKFAST   montelukast 10 MG tablet Commonly known as: SINGULAIR TAKE 1 TABLET BY MOUTH AT BEDTIME   Nucala 100 MG Solr Generic drug: Mepolizumab Mix and Inject 100mg  subcutaneously every 28 days   Olopatadine HCl 0.6 % Soln USE TWO SPRAYS IN EACH NOSTRIL TWICE DAILY   pantoprazole 40 MG tablet Commonly known as: PROTONIX Take 1 tablet (40 mg total) by mouth 2 (two) times daily.   pravastatin 40 MG tablet Commonly known as: PRAVACHOL Take 1 tablet (40 mg total) by mouth daily. What changed: when to take this   progesterone 200 MG capsule Commonly known as: Prometrium Take 1 capsule (200 mg total) by mouth at bedtime.   spironolactone 50 MG tablet Commonly known as: ALDACTONE Take 1 tablet (50 mg total) by mouth 2 (two) times daily.   UNABLE TO FIND Allergy shots-both arms-weekly   Vaniqa 13.9 % cream Generic drug: Eflornithine HCl APPLY TO AFFECTED AREA TWICE DAILY       Past Medical History:  Diagnosis Date  . Anxiety   . Arthritis   . Asthma   . Bilateral calcaneal spurs   . Bilateral polycystic ovarian syndrome   . Bulging lumbar disc   . Carpal tunnel syndrome, bilateral   . COPD (chronic obstructive pulmonary disease) (HCC)     BRONCHITIS  . Diabetes mellitus without complication ([place])    TYPE 2   DX  4-5 YRS AGO  . ETD (Eustachian tube dysfunction), bilateral    left worse than right  . Fibromyalgia   . GAD (generalized anxiety disorder)   . GERD (gastroesophageal reflux disease)    TAKES PRESCRIPTION MEDS  . Glaucoma    BOTH EYES  . Hyperlipidemia   . Hypertension   . Hypothyroidism    NODULES ON THYROID  . PONV (postoperative nausea and vomiting)   . Pulmonary embolism ([place])   . Tachycardia   . TMJ (dislocation of temporomandibular joint)   . Trigger finger   . Trigger finger of left thumb  Past Surgical History:  Procedure Laterality Date  . ANTERIOR CERVICAL DECOMP/DISCECTOMY FUSION N/A 11/13/2016   Procedure: Cervical five-six, Cervical six-seven Anterior Cervical Discectomy and Fusion, Allograft, Plate;  Surgeon: Marybelle Killings, MD;  Location: [facility];  Service: Orthopedics;  Laterality: N/A;  . BREAST SURGERY    . CARPAL TUNNEL RELEASE    . DILATION AND CURETTAGE OF UTERUS    . EXPLORATORY LAPAROTOMY    . LIPOMA EXCISION Left 06/15/2018   Procedure: LEFT CALF LIPOMA EXCISION;  Surgeon: Marybelle Killings, MD;  Location: [facility];  Service: Orthopedics;  Laterality: Left;  . NASAL SINUS SURGERY  07/22/13   Dr. Redmond Pulling in [place]  . plate and six screws in back    . REDUCTION MAMMAPLASTY Bilateral 2000  . TRIGGER FINGER RELEASE Right 06/15/2018   Procedure: RIGHT TRIGGER THUMB RELEASE;  Surgeon: Marybelle Killings, MD;  Location: [facility];  Service: Orthopedics;  Laterality: Right;    Review of systems negative except as noted in HPI / PMHx or noted below:  Review of Systems  Constitutional: Negative.   HENT: Negative.   Eyes: Negative.   Respiratory: Negative.   Cardiovascular: Negative.   Gastrointestinal: Negative.   Genitourinary: Negative.   Musculoskeletal: Negative.   Skin: Negative.   Neurological: Negative.   Endo/Heme/Allergies: Negative.    Psychiatric/Behavioral: Negative.      Objective:   Vitals:   01/23/21 1344  BP: 116/74  Pulse: 88  Resp: 16  Temp: 98.4 F (36.9 C)          Physical Exam Constitutional:      Appearance: She is not diaphoretic.  HENT:     Head: Normocephalic.     Right Ear: Tympanic membrane, ear canal and external ear normal.     Left Ear: Tympanic membrane, ear canal and external ear normal.     Nose: Nose normal. No mucosal edema or rhinorrhea.     Mouth/Throat:     Mouth: Oropharynx is clear and moist and mucous membranes are normal.     Pharynx: Uvula midline. No oropharyngeal exudate.  Eyes:     Conjunctiva/sclera: Conjunctivae normal.  Neck:     Thyroid: No thyromegaly.     Trachea: Trachea normal. No tracheal tenderness or tracheal deviation.  Cardiovascular:     Rate and Rhythm: Normal rate and regular rhythm.     Heart sounds: Normal heart sounds, S1 normal and S2 normal. No murmur heard.   Pulmonary:     Effort: No respiratory distress.     Breath sounds: Normal breath sounds. No stridor. No wheezing or rales.  Musculoskeletal:        General: No edema.  Lymphadenopathy:     Head:     Right side of head: No tonsillar adenopathy.     Left side of head: No tonsillar adenopathy.     Cervical: No cervical adenopathy.  Skin:    Findings: No erythema or rash.     Nails: There is no clubbing.  Neurological:     Mental Status: She is alert.     Diagnostics:   The patient had an Asthma Control Test with the following results: ACT Total Score: 19.    Assessment and Plan:   1. Asthma, severe persistent, well-controlled   2. Perennial allergic rhinitis   3. Seasonal allergic rhinitis due to pollen   4. Seasonal allergic conjunctivitis   5. Vernal conjunctivitis of both eyes   6. Facial pain syndrome  7. LPRD (laryngopharyngeal reflux disease)   8. 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 Continue Immunotherapy.  Maintenance dose = 0.2 red vial.     2. Continue to Treat and prevent inflammation:   A. Budesonide - 1-2 sprays each nostril daily  B. Symbicort 80 - 2 inhalations 3-7 times per week  C. montelukast 10 mg - one tablet one time per day  D. Mepolizumab injections   3. Continue to Treat and prevent reflux:   A.  Pantoprazole 40 mg twice a day  4. Treat and prevent facial pain syndrome:   A. Periactin 4 mg - 1/2 - 1 tablet at bedtime  5. If needed:   A. Patanase 2 sprays each nostril two times per day  B. OTC antihistamine - Zyrtec 10mg  1-2 times per day (dry eye?)  C. Proventil HFA or similar 2 inhalations every 4-6 hours  D. Epi-Pen  E. Eye therapy with eye doctor  6. Return to clinic in 6 months or earlier if problem  I think that Careli is doing relatively well at this point.  She needs to get through the springtime season while utilizing therapy directed against respiratory tract and conjunctival inflammation/irritation including the use of mepolizumab.  We will keep her on therapy for reflux as well on her current dose of proton pump inhibitor and she will remain on Periactin for her facial pain syndrome and sleep dysfunction.  I will see her back in this clinic in 6 months or earlier if there is a problem.  Allena Katz, MD Allergy / Immunology [place]

## 2021-01-24 ENCOUNTER — Encounter: Payer: Self-pay | Admitting: Allergy and Immunology

## 2021-01-26 ENCOUNTER — Telehealth: Payer: Self-pay | Admitting: Allergy and Immunology

## 2021-01-26 NOTE — Telephone Encounter (Signed)
Dr. Neldon Mc please advice in regards to patient's issue with the back cramps. Should she contact her PCP for this issue?

## 2021-01-26 NOTE — Telephone Encounter (Signed)
Patient was seen 01/23/21. She called today to say she is having lower left back cramps, around her kidney. She said it has now traveled up to her middle back and it feels like it is in the same spot as the blood clot she had. She would like to know if Dr. Neldon Mc will order x-rays or scans of her kidney and lung or she wants to know if she should contact her PCP. She said she would prefer Dr. Neldon Mc to order these.

## 2021-01-26 NOTE — Telephone Encounter (Signed)
Patient was informed of Dr. Bruna Potter recommendations. She states she cramps come and go and has been going on for 2 months. She states she will follow up with PCP in regards to this issue as well and will go to the emergency room if needed.

## 2021-01-26 NOTE — Telephone Encounter (Signed)
Please inform Cole that this is probably not something that we can take care of.  If it is a blood clot then that is evaluating the emergency room evaluation with an emergent CAT scan.

## 2021-01-30 ENCOUNTER — Ambulatory Visit: Payer: 59 | Admitting: Allergy and Immunology

## 2021-02-01 ENCOUNTER — Ambulatory Visit: Payer: 59 | Admitting: Podiatry

## 2021-02-01 ENCOUNTER — Telehealth: Payer: Self-pay | Admitting: Podiatry

## 2021-02-01 ENCOUNTER — Ambulatory Visit (INDEPENDENT_AMBULATORY_CARE_PROVIDER_SITE_OTHER): Payer: 59 | Admitting: *Deleted

## 2021-02-01 ENCOUNTER — Other Ambulatory Visit: Payer: 59

## 2021-02-01 DIAGNOSIS — J309 Allergic rhinitis, unspecified: Secondary | ICD-10-CM

## 2021-02-01 NOTE — Telephone Encounter (Signed)
Chart note was update as requested by patient. Jennifer Mullins will send in chart notes with authorization.

## 2021-02-01 NOTE — Telephone Encounter (Signed)
Pt called stating she talked with her insurance([employer]) and they never received a prior authorization request.  Pt states Dr Stephenie Acres nurse was to be doing this and it has to have all the dx on it that is documented in pts chart from 2.10.2022 visit. Pt would like to talk to Dr Stephenie Acres nurse about this. I offered to fax it over but she is requesting to talk to nurse since she was the one that was to be doing the [place].  Pt states she talked with the supervisor Corky Sing ) @ [employer].

## 2021-02-02 ENCOUNTER — Other Ambulatory Visit (HOSPITAL_COMMUNITY): Payer: Self-pay | Admitting: Family

## 2021-02-02 ENCOUNTER — Ambulatory Visit (HOSPITAL_COMMUNITY)
Admission: RE | Admit: 2021-02-02 | Discharge: 2021-02-02 | Disposition: A | Discharge status: Home / Self Care | Payer: 59 | Source: Ambulatory Visit | Attending: Family | Admitting: Family

## 2021-02-02 ENCOUNTER — Telehealth: Payer: Self-pay | Admitting: Podiatry

## 2021-02-02 ENCOUNTER — Other Ambulatory Visit: Payer: Self-pay

## 2021-02-02 DIAGNOSIS — R109 Unspecified abdominal pain: Secondary | ICD-10-CM | POA: Diagnosis not present

## 2021-02-02 NOTE — Telephone Encounter (Signed)
Pt called and left message yesterday @ 314pm stating she had not heard from [facility] nurse but she wanted to give [place] the fax number to fax the notes to for prior auth for the orthotics. She said [place] is the supervisor she spoke to and the fax # [phone].  I received message from Dr Milinda Pointer this morning that he had addended the note and asked me to fax it to [employer]. I have done this to the number given to me by the patient and notified pt that I have faxed it and received confirmation.

## 2021-02-02 NOTE — Telephone Encounter (Signed)
[employer] insurance calling to clarify and verify diagnostic codes from date of service 01-07-21.  CPP code [id number] Please advise or contact John @ [employer]. Pending authorization due to lack of information

## 2021-02-03 ENCOUNTER — Other Ambulatory Visit: Payer: Self-pay

## 2021-02-03 ENCOUNTER — Emergency Department (HOSPITAL_COMMUNITY): Payer: 59

## 2021-02-03 ENCOUNTER — Emergency Department (HOSPITAL_COMMUNITY)
Admission: EM | Admit: 2021-02-03 | Discharge: 2021-02-03 | Disposition: A | Discharge status: Home / Self Care | Payer: 59 | Attending: Emergency Medicine | Admitting: Emergency Medicine

## 2021-02-03 ENCOUNTER — Encounter (HOSPITAL_COMMUNITY): Payer: Self-pay

## 2021-02-03 DIAGNOSIS — M25572 Pain in left ankle and joints of left foot: Secondary | ICD-10-CM | POA: Diagnosis not present

## 2021-02-03 DIAGNOSIS — Z7951 Long term (current) use of inhaled steroids: Secondary | ICD-10-CM | POA: Insufficient documentation

## 2021-02-03 DIAGNOSIS — J449 Chronic obstructive pulmonary disease, unspecified: Secondary | ICD-10-CM | POA: Diagnosis not present

## 2021-02-03 DIAGNOSIS — R519 Headache, unspecified: Secondary | ICD-10-CM | POA: Insufficient documentation

## 2021-02-03 DIAGNOSIS — M25512 Pain in left shoulder: Secondary | ICD-10-CM | POA: Insufficient documentation

## 2021-02-03 DIAGNOSIS — E119 Type 2 diabetes mellitus without complications: Secondary | ICD-10-CM | POA: Insufficient documentation

## 2021-02-03 DIAGNOSIS — M542 Cervicalgia: Secondary | ICD-10-CM | POA: Diagnosis not present

## 2021-02-03 DIAGNOSIS — J45909 Unspecified asthma, uncomplicated: Secondary | ICD-10-CM | POA: Diagnosis not present

## 2021-02-03 DIAGNOSIS — Z7982 Long term (current) use of aspirin: Secondary | ICD-10-CM | POA: Diagnosis not present

## 2021-02-03 DIAGNOSIS — I10 Essential (primary) hypertension: Secondary | ICD-10-CM | POA: Diagnosis not present

## 2021-02-03 DIAGNOSIS — E039 Hypothyroidism, unspecified: Secondary | ICD-10-CM | POA: Diagnosis not present

## 2021-02-03 DIAGNOSIS — Z79899 Other long term (current) drug therapy: Secondary | ICD-10-CM | POA: Diagnosis not present

## 2021-02-03 DIAGNOSIS — Y92410 Unspecified street and highway as the place of occurrence of the external cause: Secondary | ICD-10-CM | POA: Diagnosis not present

## 2021-02-03 DIAGNOSIS — Z87891 Personal history of nicotine dependence: Secondary | ICD-10-CM | POA: Diagnosis not present

## 2021-02-03 DIAGNOSIS — Z7984 Long term (current) use of oral hypoglycemic drugs: Secondary | ICD-10-CM | POA: Insufficient documentation

## 2021-02-03 DIAGNOSIS — M7918 Myalgia, other site: Secondary | ICD-10-CM

## 2021-02-03 MED ORDER — CYCLOBENZAPRINE HCL 10 MG PO TABS: 10.0000 mg | ORAL_TABLET | Freq: Two times a day (BID) | ORAL | 0 refills | Status: DC | PRN

## 2021-02-03 MED ORDER — IBUPROFEN 400 MG PO TABS
600.0000 mg | ORAL_TABLET | Freq: Once | ORAL | Status: AC
  Administered 2021-02-03: 600 mg via ORAL
  Filled 2021-02-03: qty 2
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
## 2021-02-05 NOTE — Telephone Encounter (Signed)
Here is some information you may need.

## 2021-02-08 ENCOUNTER — Other Ambulatory Visit: Payer: Self-pay | Admitting: Allergy and Immunology

## 2021-02-09 ENCOUNTER — Other Ambulatory Visit: Payer: Self-pay | Admitting: Family

## 2021-02-09 DIAGNOSIS — R109 Unspecified abdominal pain: Secondary | ICD-10-CM

## 2021-02-15 ENCOUNTER — Ambulatory Visit (INDEPENDENT_AMBULATORY_CARE_PROVIDER_SITE_OTHER): Payer: 59 | Admitting: *Deleted

## 2021-02-15 DIAGNOSIS — J309 Allergic rhinitis, unspecified: Secondary | ICD-10-CM | POA: Diagnosis not present

## 2021-02-20 ENCOUNTER — Ambulatory Visit: Payer: Self-pay

## 2021-02-21 ENCOUNTER — Ambulatory Visit (INDEPENDENT_AMBULATORY_CARE_PROVIDER_SITE_OTHER): Payer: 59

## 2021-02-21 DIAGNOSIS — J4550 Severe persistent asthma, uncomplicated: Secondary | ICD-10-CM | POA: Diagnosis not present

## 2021-03-05 ENCOUNTER — Ambulatory Visit
Admission: RE | Admit: 2021-03-05 | Discharge: 2021-03-05 | Disposition: A | Discharge status: Home / Self Care | Payer: 59 | Source: Ambulatory Visit | Attending: Family | Admitting: Family

## 2021-03-05 ENCOUNTER — Ambulatory Visit (INDEPENDENT_AMBULATORY_CARE_PROVIDER_SITE_OTHER): Payer: 59 | Admitting: *Deleted

## 2021-03-05 DIAGNOSIS — J309 Allergic rhinitis, unspecified: Secondary | ICD-10-CM

## 2021-03-05 DIAGNOSIS — R109 Unspecified abdominal pain: Secondary | ICD-10-CM

## 2021-03-12 ENCOUNTER — Telehealth: Payer: Self-pay | Admitting: Podiatry

## 2021-03-12 NOTE — Telephone Encounter (Signed)
Pt left message 4.22 asking if insurance had paid for the orthotics.    I returned call today and explained that we resubmitted it when we got the authorization last week from [employer]. She was confused why we did not submit it when we started the authorization. I explained that if we would have submitted it without the authorization they would have denied it again and so we waited until we got the authorization and resubmitted at that time. She said she is still keeping her appt with Dr Milinda Pointer as this has already prolonged it and if insurance has not paid for the orthotics she is not going to take them. I explained that she did sign the financial form stating if insurance did not cover she would be responsible so she could go ahead and take the orthotics at her appt. She is going to think about it and let us know what she decides when she is here.

## 2021-03-15 ENCOUNTER — Encounter: Payer: Self-pay | Admitting: Podiatry

## 2021-03-15 ENCOUNTER — Ambulatory Visit (INDEPENDENT_AMBULATORY_CARE_PROVIDER_SITE_OTHER): Payer: 59 | Admitting: Podiatry

## 2021-03-15 ENCOUNTER — Other Ambulatory Visit: Payer: Self-pay

## 2021-03-15 ENCOUNTER — Ambulatory Visit (INDEPENDENT_AMBULATORY_CARE_PROVIDER_SITE_OTHER): Payer: 59

## 2021-03-15 DIAGNOSIS — S90122D Contusion of left lesser toe(s) without damage to nail, subsequent encounter: Secondary | ICD-10-CM

## 2021-03-15 DIAGNOSIS — M722 Plantar fascial fibromatosis: Secondary | ICD-10-CM

## 2021-03-15 MED ORDER — TRIAMCINOLONE ACETONIDE 40 MG/ML IJ SUSP
20.0000 mg | Freq: Once | INTRAMUSCULAR | Status: AC
Start: 2021-03-15 — End: 2021-03-15
  Administered 2021-03-15: 20 mg

## 2021-03-15 NOTE — Telephone Encounter (Signed)
Patient is requesting a copy of the prior authorization letter stating the she was approved for the orthotics.

## 2021-03-17 NOTE — Progress Notes (Signed)
She presents today after having not seen her since February.  She presented today to pick up her orthotics.  She goes on to say that she injured her third and fourth toes of her left foot by hitting them on the dresser this morning she states that the fourth and seems to be laying to the side more than it was.  Date of injury was 03/15/2021 left foot kicking the dresser and is now experiencing lateral deviation of the toe.  She goes on to say that the orthotics are not made the way they are supposed to be there is supposed to have been shorter and she feels that the arch is not supportive enough.  Objective: Vital signs are stable she is alert and oriented x3.  Pulses are palpable.  Neurologic sensorium is intact reflexes are intact muscle strength is normal symmetrical.  She does have some posterior tibial tendon pain on palpation bilaterally with mild to moderate plantar fascial type symptoms as well.  States that the majority of her pain is right here if she points to the posterior tibial tendon extending to the medial calcaneal tubercle.  Most likely this is compensatory in nature  Radiographs taken today demonstrate an osseously mature individual mild pes planus.  Lesser digits to the left foot demonstrate an osseously mature individual no fractures are identified.  Mild deviation.  However there is no dislocation of the fourth toe.  No fractures identified.  Assessment contusion toes third and fourth left.  Posterior tibial tendinitis and plantar fasciitis bilateral.  Plan: Discussed etiology pathology conservative versus surgical therapies at this point were going to have her back with a EJ to augment her orthotics.  And I proceeded to perform an injection for her today from the posterior tibial tendon area plantarly toward Planter fasciitis.  She tolerated this procedure well with ethyl chloride and alcohol skin prep.  I will follow-up with her once her orthotics have come in or at any other point  that she needs me.

## 2021-03-21 ENCOUNTER — Ambulatory Visit (INDEPENDENT_AMBULATORY_CARE_PROVIDER_SITE_OTHER): Payer: 59

## 2021-03-21 DIAGNOSIS — J4550 Severe persistent asthma, uncomplicated: Secondary | ICD-10-CM

## 2021-03-21 MED ORDER — MEPOLIZUMAB 100 MG ~~LOC~~ SOLR
100.0000 mg | SUBCUTANEOUS | Status: AC
  Administered 2021-03-21 – 2024-12-08 (×48): 100 mg via SUBCUTANEOUS

## 2021-03-30 ENCOUNTER — Ambulatory Visit (INDEPENDENT_AMBULATORY_CARE_PROVIDER_SITE_OTHER): Payer: 59

## 2021-03-30 DIAGNOSIS — J309 Allergic rhinitis, unspecified: Secondary | ICD-10-CM

## 2021-04-02 ENCOUNTER — Encounter: Payer: Self-pay | Admitting: Obstetrics & Gynecology

## 2021-04-02 ENCOUNTER — Other Ambulatory Visit: Payer: Self-pay

## 2021-04-02 ENCOUNTER — Ambulatory Visit (INDEPENDENT_AMBULATORY_CARE_PROVIDER_SITE_OTHER): Payer: 59 | Admitting: Obstetrics & Gynecology

## 2021-04-02 VITALS — BP 135/87 | HR 88 | Ht 62.0 in | Wt 134.0 lb

## 2021-04-02 DIAGNOSIS — N924 Excessive bleeding in the premenopausal period: Secondary | ICD-10-CM | POA: Diagnosis not present

## 2021-04-02 MED ORDER — PROGESTERONE 200 MG PO CAPS: 200.0000 mg | ORAL_CAPSULE | Freq: Every day | ORAL | 11 refills | Status: DC

## 2021-04-02 NOTE — Progress Notes (Signed)
Chief Complaint  Patient presents with  . Follow-up      50 y.o. [id number] Patient's last menstrual period was 01/08/2021 (exact date). The current method of family planning is post menopausal status.  Outpatient Encounter Medications as of 04/02/2021  Medication Sig  . albuterol (VENTOLIN HFA) 108 (90 Base) MCG/ACT inhaler INHALE TWO PUFFS INTO THE LUNGS EVERY 6 HOURS AS NEEDED FOR WHEEZING OR SHORTNESS OF BREATH  . ALPRAZolam (XANAX) 0.5 MG tablet TAKE 1 TABLET BY MOUTH TWICE DAILY AS NEEDED. MAY TAKE ONE MORE TABLET BEFORE FOR ALLERGY SHOTS ONCE A WEEK (30 DAY supply)  . aspirin 81 MG chewable tablet Chew 81 mg by mouth daily.  Marland Kitchen AUVI-Q 0.3 MG/0.3ML SOAJ injection Use for life threatening allergic reactions  . budesonide (RHINOCORT AQUA) 32 MCG/ACT nasal spray 1-2 spray in each nostril daily  . budesonide-formoterol (SYMBICORT) 80-4.5 MCG/ACT inhaler Inhale 2 puffs into the lungs daily.  . cetirizine (ZYRTEC) 10 MG tablet TAKE 1 TABLET BY MOUTH TWICE DAILY  . cromolyn (OPTICROM) 4 % ophthalmic solution Place 1 drop into both eyes 4 (four) times daily.   . cycloSPORINE (RESTASIS) 0.05 % ophthalmic emulsion Place 1 drop into both eyes 2 (two) times daily.  . cyproheptadine (PERIACTIN) 4 MG tablet Take one and a half tablet in the morning and 2 tablets at night (Patient taking differently: Take 1/2 to 1 tablet in the morning and 2 tablets at night)  . diclofenac (VOLTAREN) 0.1 % ophthalmic solution 1 drop 2 (two) times daily.  . diclofenac Sodium (VOLTAREN) 1 % GEL Apply 2 g topically 4 (four) times daily.  . Diethylpropion HCl CR 75 MG TB24 Take 1 tablet (75 mg total) by mouth daily.  Marland Kitchen Epinastine HCl 0.05 % ophthalmic solution Apply 1 drop to eye 2 (two) times daily.  Marland Kitchen HYDROcodone-acetaminophen (NORCO) 7.5-325 MG tablet Take 1 tablet by mouth every 8 (eight) hours as needed.  . linaclotide (LINZESS) 290 MCG CAPS capsule Take 1 capsule (290 mcg total) by mouth daily before  breakfast.  . losartan (COZAAR) 25 MG tablet Take 1 tablet (25 mg total) by mouth daily. (Patient taking differently: Take 25 mg by mouth daily. Taking PRN per patient)  . megestrol (MEGACE) 40 MG tablet TAKE 3 TABLETS DAILY BY MOUTH FOR 5 DAYS, 2 TABLETS DAILY FOR 5 DAYS, THEN 1 TABLET DAILY  . metFORMIN (GLUCOPHAGE) 500 MG tablet TAKE 1 TABLET BY MOUTH EVERY DAY WITH BREAKFAST  . montelukast (SINGULAIR) 10 MG tablet TAKE 1 TABLET BY MOUTH AT BEDTIME  . NUCALA 100 MG SOLR Mix and Inject 100mg  subcutaneously every 28 days  . Olopatadine HCl 0.6 % SOLN USE TWO SPRAYS IN EACH NOSTRIL TWICE DAILY  . pantoprazole (PROTONIX) 40 MG tablet Take 1 tablet (40 mg total) by mouth 2 (two) times daily.  . pravastatin (PRAVACHOL) 40 MG tablet Take 1 tablet (40 mg total) by mouth daily. (Patient taking differently: Take 40 mg by mouth 2 (two) times daily.)  . Scar Treatment Products (CELACYN) GEL Apply 1 application topically every morning.  Marland Kitchen spironolactone (ALDACTONE) 50 MG tablet Take 1 tablet (50 mg total) by mouth 2 (two) times daily.  Marland Kitchen UNABLE TO FIND Allergy shots-both arms-weekly  . VANIQA 13.9 % cream APPLY TO AFFECTED AREA TWICE DAILY  . [DISCONTINUED] progesterone (PROMETRIUM) 200 MG capsule Take 1 capsule (200 mg total) by mouth at bedtime.  Marland Kitchen azelastine (OPTIVAR) 0.05 % ophthalmic solution Apply 1 drop to eye 2 (two) times daily. (  Patient not taking: Reported on 04/02/2021)  . progesterone (PROMETRIUM) 200 MG capsule Take 1 capsule (200 mg total) by mouth at bedtime.  . [DISCONTINUED] clindamycin (CLEOCIN) 300 MG capsule SMARTSIG:2 Capsule(s) By Mouth  . [DISCONTINUED] cyclobenzaprine (FLEXERIL) 10 MG tablet Take 1 tablet (10 mg total) by mouth 2 (two) times daily as needed for muscle spasms.  . [DISCONTINUED] Mepolizumab (NUCALA) 100 MG SOLR Inject 100 mg into the skin every 28 (twenty-eight) days.   Facility-Administered Encounter Medications as of 04/02/2021  Medication  . Mepolizumab SOLR 100  mg    Subjective Jennifer Mullins is in for follow-up on her Prometrium We had a little bit of a difficulty over the fall getting her stabilized with her perimenopausal bleeding However we synchronize her endometrium with the Megace course and then switched her to Prometrium 200 mg 21 days a month and off 7 Since December she has had just a few days of light spotting nothing more than that We will keep her on this regimen for 1 year If she has this degree or less of spotting and bleeding over that year we will switch her to 10 days of Prometrium a month for a year Then 10 days a month every 3 months for a year and then off Past Medical History:  Diagnosis Date  . Anxiety   . Arthritis   . Asthma   . Bilateral calcaneal spurs   . Bilateral polycystic ovarian syndrome   . Bulging lumbar disc   . Carpal tunnel syndrome, bilateral   . COPD (chronic obstructive pulmonary disease) (HCC)    BRONCHITIS  . Diabetes mellitus without complication ([place])    TYPE 2   DX  4-5 YRS AGO  . ETD (Eustachian tube dysfunction), bilateral    left worse than right  . Fibromyalgia   . GAD (generalized anxiety disorder)   . GERD (gastroesophageal reflux disease)    TAKES PRESCRIPTION MEDS  . Glaucoma    BOTH EYES  . Hyperlipidemia   . Hypertension   . Hypothyroidism    NODULES ON THYROID  . PONV (postoperative nausea and vomiting)   . Pulmonary embolism ([place])   . Tachycardia   . TMJ (dislocation of temporomandibular joint)   . Trigger finger   . Trigger finger of left thumb     Past Surgical History:  Procedure Laterality Date  . ANTERIOR CERVICAL DECOMP/DISCECTOMY FUSION N/A 11/13/2016   Procedure: Cervical five-six, Cervical six-seven Anterior Cervical Discectomy and Fusion, Allograft, Plate;  Surgeon: Marybelle Killings, MD;  Location: [facility];  Service: Orthopedics;  Laterality: N/A;  . BREAST SURGERY    . CARPAL TUNNEL RELEASE    . DILATION AND CURETTAGE OF UTERUS    . EXPLORATORY LAPAROTOMY    . LIPOMA  EXCISION Left 06/15/2018   Procedure: LEFT CALF LIPOMA EXCISION;  Surgeon: Marybelle Killings, MD;  Location: [facility];  Service: Orthopedics;  Laterality: Left;  . NASAL SINUS SURGERY  07/22/13   Dr. Redmond Pulling in [place]  . plate and six screws in back    . REDUCTION MAMMAPLASTY Bilateral 2000  . TRIGGER FINGER RELEASE Right 06/15/2018   Procedure: RIGHT TRIGGER THUMB RELEASE;  Surgeon: Marybelle Killings, MD;  Location: [facility];  Service: Orthopedics;  Laterality: Right;    OB History    Gravida  1   Para  1   Term      Preterm  1   AB      Living  1  SAB      IAB      Ectopic      Multiple      Live Births  1           Allergies  Allergen Reactions  . Demerol [Meperidine] Shortness Of Breath    FLUSHING AND SHORTNESS OF BREATH  . Morphine And Related Shortness Of Breath    Flushed and hot hyper Flushed and hot hyper Flushed and hot hyper Flushed and hot hyper  . Other Shortness Of Breath    Flushed and hot hyper Flushed and hot hyper Flushed and hot hyper Flushed and hot hyper Flushed and hot hyper  . Cardizem  [Diltiazem Hcl] Other (See Comments)  . Diltiazem Hcl Hives  . Gabapentin Other (See Comments)    Chest pain and elevated heart rate  . Lisinopril     Caused cough  . Lyrica [Pregabalin]     Chest pain, elevated heart rate  . Tramadol Other (See Comments)    Tears up stomach    Social History   Socioeconomic History  . Marital status: Married    Spouse name: Not on file  . Number of children: Not on file  . Years of education: Not on file  . Highest education level: Not on file  Occupational History  . Occupation: Unemployed  Tobacco Use  . Smoking status: Former Smoker    Packs/day: 0.25    Years: 5.00    Pack years: 1.25    Types: Cigarettes    Quit date: 11/18/1989    Years since quitting: 31.3  . Smokeless tobacco: Never Used  Vaping Use  . Vaping Use: Never used  Substance and Sexual Activity  .  Alcohol use: No  . Drug use: No  . Sexual activity: Yes    Birth control/protection: None  Other Topics Concern  . Not on file  Social History Narrative  . Not on file   Social Determinants of Health   Financial Resource Strain: Not on file  Food Insecurity: Not on file  Transportation Needs: Not on file  Physical Activity: Not on file  Stress: Not on file  Social Connections: Not on file    Family History  Problem Relation Age of Onset  . Hypertension Mother   . Hypertension Father   . Cancer Son   . Colon cancer Neg Hx   . Rectal cancer Neg Hx   . Stomach cancer Neg Hx   . Esophageal cancer Neg Hx     Medications:       Current Outpatient Medications:  .  albuterol (VENTOLIN HFA) 108 (90 Base) MCG/ACT inhaler, INHALE TWO PUFFS INTO THE LUNGS EVERY 6 HOURS AS NEEDED FOR WHEEZING OR SHORTNESS OF BREATH, Disp: 8.5 g, Rfl: 5 .  ALPRAZolam (XANAX) 0.5 MG tablet, TAKE 1 TABLET BY MOUTH TWICE DAILY AS NEEDED. MAY TAKE ONE MORE TABLET BEFORE FOR ALLERGY SHOTS ONCE A WEEK (30 DAY supply), Disp: 65 tablet, Rfl: 5 .  aspirin 81 MG chewable tablet, Chew 81 mg by mouth daily., Disp: , Rfl:  .  AUVI-Q 0.3 MG/0.3ML SOAJ injection, Use for life threatening allergic reactions, Disp: 4 each, Rfl: 3 .  budesonide (RHINOCORT AQUA) 32 MCG/ACT nasal spray, 1-2 spray in each nostril daily, Disp: 5 mL, Rfl: 5 .  budesonide-formoterol (SYMBICORT) 80-4.5 MCG/ACT inhaler, Inhale 2 puffs into the lungs daily., Disp: 1 Inhaler, Rfl: 5 .  cetirizine (ZYRTEC) 10 MG tablet, TAKE 1 TABLET BY MOUTH TWICE DAILY, Disp:  60 tablet, Rfl: 2 .  cromolyn (OPTICROM) 4 % ophthalmic solution, Place 1 drop into both eyes 4 (four) times daily. , Disp: , Rfl: 1 .  cycloSPORINE (RESTASIS) 0.05 % ophthalmic emulsion, Place 1 drop into both eyes 2 (two) times daily., Disp: , Rfl:  .  cyproheptadine (PERIACTIN) 4 MG tablet, Take one and a half tablet in the morning and 2 tablets at night (Patient taking differently: Take 1/2  to 1 tablet in the morning and 2 tablets at night), Disp: 105 tablet, Rfl: 5 .  diclofenac (VOLTAREN) 0.1 % ophthalmic solution, 1 drop 2 (two) times daily., Disp: , Rfl:  .  diclofenac Sodium (VOLTAREN) 1 % GEL, Apply 2 g topically 4 (four) times daily., Disp: , Rfl:  .  Diethylpropion HCl CR 75 MG TB24, Take 1 tablet (75 mg total) by mouth daily., Disp: 30 tablet, Rfl: 5 .  Epinastine HCl 0.05 % ophthalmic solution, Apply 1 drop to eye 2 (two) times daily., Disp: , Rfl:  .  HYDROcodone-acetaminophen (NORCO) 7.5-325 MG tablet, Take 1 tablet by mouth every 8 (eight) hours as needed., Disp: , Rfl:  .  linaclotide (LINZESS) 290 MCG CAPS capsule, Take 1 capsule (290 mcg total) by mouth daily before breakfast., Disp: 30 capsule, Rfl: 5 .  losartan (COZAAR) 25 MG tablet, Take 1 tablet (25 mg total) by mouth daily. (Patient taking differently: Take 25 mg by mouth daily. Taking PRN per patient), Disp: 90 tablet, Rfl: 3 .  megestrol (MEGACE) 40 MG tablet, TAKE 3 TABLETS DAILY BY MOUTH FOR 5 DAYS, 2 TABLETS DAILY FOR 5 DAYS, THEN 1 TABLET DAILY, Disp: 45 tablet, Rfl: 4 .  metFORMIN (GLUCOPHAGE) 500 MG tablet, TAKE 1 TABLET BY MOUTH EVERY DAY WITH BREAKFAST, Disp: 90 tablet, Rfl: 3 .  montelukast (SINGULAIR) 10 MG tablet, TAKE 1 TABLET BY MOUTH AT BEDTIME, Disp: 30 tablet, Rfl: 5 .  NUCALA 100 MG SOLR, Mix and Inject 100mg  subcutaneously every 28 days, Disp: 1 each, Rfl: 10 .  Olopatadine HCl 0.6 % SOLN, USE TWO SPRAYS IN EACH NOSTRIL TWICE DAILY, Disp: 30.5 g, Rfl: 4 .  pantoprazole (PROTONIX) 40 MG tablet, Take 1 tablet (40 mg total) by mouth 2 (two) times daily., Disp: 60 tablet, Rfl: 5 .  pravastatin (PRAVACHOL) 40 MG tablet, Take 1 tablet (40 mg total) by mouth daily. (Patient taking differently: Take 40 mg by mouth 2 (two) times daily.), Disp: 90 tablet, Rfl: 1 .  Scar Treatment Products (CELACYN) GEL, Apply 1 application topically every morning., Disp: 28 g, Rfl: 3 .  spironolactone (ALDACTONE) 50 MG  tablet, Take 1 tablet (50 mg total) by mouth 2 (two) times daily., Disp: 60 tablet, Rfl: 5 .  UNABLE TO FIND, Allergy shots-both arms-weekly, Disp: , Rfl:  .  VANIQA 13.9 % cream, APPLY TO AFFECTED AREA TWICE DAILY, Disp: 45 g, Rfl: 5 .  azelastine (OPTIVAR) 0.05 % ophthalmic solution, Apply 1 drop to eye 2 (two) times daily. (Patient not taking: Reported on 04/02/2021), Disp: , Rfl:  .  progesterone (PROMETRIUM) 200 MG capsule, Take 1 capsule (200 mg total) by mouth at bedtime., Disp: 30 capsule, Rfl: 11  Current Facility-Administered Medications:  Marland Kitchen  Mepolizumab SOLR 100 mg, 100 mg, Subcutaneous, Q28 days, Kozlow, Donnamarie Poag, MD, 100 mg at 03/21/21 1417  Objective Blood pressure 135/87, pulse 88, height 5\' 2"  (1.575 m), weight 134 lb (60.8 kg), last menstrual period 01/08/2021.  General well-developed well-nourished no acute distress  Pertinent ROS No burning with  urination, frequency or urgency No nausea, vomiting or diarrhea Nor fever chills or other constitutional symptoms   Labs or studies     Impression Diagnoses this Encounter::   ICD-10-CM   1. Perimenopausal DUB, prometrium 21 d then off for 7 days  N92.4     Established relevant diagnosis(es):   Plan/Recommendations: Meds ordered this encounter  Medications  . progesterone (PROMETRIUM) 200 MG capsule    Sig: Take 1 capsule (200 mg total) by mouth at bedtime.    Dispense:  30 capsule    Refill:  11    Labs or Scans Ordered: No orders of the defined types were placed in this encounter.   Management:: Patient also ask about increasing her baby aspirin to 2 baby aspirin a day which I okayed at this point should not have any effect in her bleeding since we have her endometrium synchronized and suppressed  Will be seen back in 1 year and instructed to give [place] a call should she have any bleeding difficulties over that period of time  Follow up Return in about 1 year (around 04/02/2022) for Follow up, with Dr  Elonda Husky.       All questions were answered.

## 2021-04-03 ENCOUNTER — Ambulatory Visit (INDEPENDENT_AMBULATORY_CARE_PROVIDER_SITE_OTHER): Payer: 59 | Admitting: Dermatology

## 2021-04-03 ENCOUNTER — Encounter: Payer: Self-pay | Admitting: Dermatology

## 2021-04-03 DIAGNOSIS — L820 Inflamed seborrheic keratosis: Secondary | ICD-10-CM | POA: Diagnosis not present

## 2021-04-03 DIAGNOSIS — L680 Hirsutism: Secondary | ICD-10-CM | POA: Diagnosis not present

## 2021-04-03 MED ORDER — VANIQA 13.9 % EX CREA: TOPICAL_CREAM | CUTANEOUS | 11 refills | Status: DC

## 2021-04-11 ENCOUNTER — Telehealth: Payer: Self-pay

## 2021-04-11 ENCOUNTER — Ambulatory Visit (INDEPENDENT_AMBULATORY_CARE_PROVIDER_SITE_OTHER): Payer: 59

## 2021-04-11 DIAGNOSIS — J309 Allergic rhinitis, unspecified: Secondary | ICD-10-CM | POA: Diagnosis not present

## 2021-04-11 NOTE — Telephone Encounter (Signed)
Yes, we can try and go up to 0.25 ml with the next injection.

## 2021-04-11 NOTE — Telephone Encounter (Signed)
Called and spoke with patient and advised. Patient verbalized understanding. Patient stated that she wants to take a 1 week break between the .38mL dose and the .69mL dose. Then be every 2 weeks per Dr. Bruna Potter schedule. Advised that this will not interfere in the patient increasing to the .44mL and it has been update in her chart. Patient verbalized understanding.

## 2021-04-11 NOTE — Telephone Encounter (Signed)
Dr. Neldon Mc the patient came in today to get injections starting a new vial and received 0.1 and wants to know after she receives her 0.2 next week can she continue on to a 2.5 and have that as her maintenance. She would like to know if she can come in two weeks for the 2.5 if you allow her to.

## 2021-04-15 ENCOUNTER — Other Ambulatory Visit: Payer: Self-pay

## 2021-04-15 DIAGNOSIS — I781 Nevus, non-neoplastic: Secondary | ICD-10-CM

## 2021-04-17 ENCOUNTER — Encounter: Payer: Self-pay | Admitting: Dermatology

## 2021-04-17 NOTE — Progress Notes (Signed)
   Follow-Up Visit   Subjective  Jennifer Mullins is a 50 y.o. female who presents for the following: Skin Problem (Patient here today to have her breast checked for lesions and to have the lesion under her right eye recheck. Patient would also like a lesion checked on her post neck x years. No bleeding from any of the lesions.  No personal history of atypical moles, melanoma and non mole skin cancer. Patient would also like a refill on her Spironolactone ).  Several lesions to check, also refill spironolactone. Location:  Duration:  Quality:  Associated Signs/Symptoms: Modifying Factors:  Severity:  Timing: Context:   Objective  Well appearing patient in no apparent distress; mood and affect are within normal limits. Objective  Left Inframammary Fold (3), Left Posterior Neck, Left Temple (3), Right Lower Eyelid, Right Zygomatic Area (3): Mildly inflamed flattopped textured brown 4 to 8 mm papules.  Typical dermoscopy.  Objective  Chest - Medial [facility]), Mid Chin, Philtrum: Has had some success with combination of Benicar plus low-dose spironolactone.  Understands that excessive hair may be less responsive to treatment in African-Americans.    A focused examination was performed including Head, neck, face, neck, chest.. Relevant physical exam findings are noted in the Assessment and Plan.   Assessment & Plan    Inflamed seborrheic keratosis (11) Left Inframammary Fold (3); Right Lower Eyelid; Left Temple (3); Right Zygomatic Area (3); Left Posterior Neck  Benign nature explained, check as needed change  Hirsutism (3) Chest - Medial (Center); Mid Chin; Philtrum  Okay refills, contact me if they are no longer effective.  Reordered Medications VANIQA 13.9 % cream  Other Related Medications spironolactone (ALDACTONE) 50 MG tablet      I, Lavonna Monarch, MD, have reviewed all documentation for this visit.  The documentation on 04/17/21 for the exam, diagnosis,  procedures, and orders are all accurate and complete.

## 2021-04-18 ENCOUNTER — Other Ambulatory Visit: Payer: Self-pay

## 2021-04-18 ENCOUNTER — Ambulatory Visit (INDEPENDENT_AMBULATORY_CARE_PROVIDER_SITE_OTHER): Payer: 59

## 2021-04-18 DIAGNOSIS — J4550 Severe persistent asthma, uncomplicated: Secondary | ICD-10-CM

## 2021-04-20 ENCOUNTER — Ambulatory Visit (INDEPENDENT_AMBULATORY_CARE_PROVIDER_SITE_OTHER): Payer: 59

## 2021-04-20 DIAGNOSIS — J309 Allergic rhinitis, unspecified: Secondary | ICD-10-CM | POA: Diagnosis not present

## 2021-04-23 ENCOUNTER — Telehealth: Payer: Self-pay | Admitting: Podiatry

## 2021-04-23 NOTE — Telephone Encounter (Signed)
Left message for pt that we did get the orthotics in so she can keep appt with EJ on 6.10.22

## 2021-04-27 ENCOUNTER — Ambulatory Visit (INDEPENDENT_AMBULATORY_CARE_PROVIDER_SITE_OTHER): Payer: 59 | Admitting: *Deleted

## 2021-04-27 ENCOUNTER — Other Ambulatory Visit: Payer: 59

## 2021-04-27 ENCOUNTER — Encounter (HOSPITAL_COMMUNITY): Payer: 59

## 2021-04-27 DIAGNOSIS — J309 Allergic rhinitis, unspecified: Secondary | ICD-10-CM | POA: Diagnosis not present

## 2021-05-07 ENCOUNTER — Other Ambulatory Visit: Payer: Self-pay | Admitting: Allergy and Immunology

## 2021-05-07 ENCOUNTER — Other Ambulatory Visit: Payer: Self-pay | Admitting: Dermatology

## 2021-05-07 DIAGNOSIS — L680 Hirsutism: Secondary | ICD-10-CM

## 2021-05-10 ENCOUNTER — Ambulatory Visit (HOSPITAL_COMMUNITY)
Admission: RE | Admit: 2021-05-10 | Discharge: 2021-05-10 | Disposition: A | Discharge status: Home / Self Care | Payer: 59 | Source: Ambulatory Visit | Attending: Vascular Surgery | Admitting: Vascular Surgery

## 2021-05-10 ENCOUNTER — Encounter (HOSPITAL_COMMUNITY): Payer: 59

## 2021-05-10 ENCOUNTER — Other Ambulatory Visit: Payer: Self-pay

## 2021-05-10 ENCOUNTER — Ambulatory Visit (INDEPENDENT_AMBULATORY_CARE_PROVIDER_SITE_OTHER): Payer: 59 | Admitting: Physician Assistant

## 2021-05-10 ENCOUNTER — Encounter: Payer: Self-pay | Admitting: Physician Assistant

## 2021-05-10 ENCOUNTER — Ambulatory Visit (INDEPENDENT_AMBULATORY_CARE_PROVIDER_SITE_OTHER): Payer: 59 | Admitting: *Deleted

## 2021-05-10 VITALS — BP 109/73 | HR 76 | Temp 98.4°F | Resp 20 | Ht 62.0 in | Wt 135.4 lb

## 2021-05-10 DIAGNOSIS — E119 Type 2 diabetes mellitus without complications: Secondary | ICD-10-CM | POA: Insufficient documentation

## 2021-05-10 DIAGNOSIS — I781 Nevus, non-neoplastic: Secondary | ICD-10-CM

## 2021-05-10 DIAGNOSIS — J309 Allergic rhinitis, unspecified: Secondary | ICD-10-CM

## 2021-05-10 DIAGNOSIS — K5909 Other constipation: Secondary | ICD-10-CM | POA: Insufficient documentation

## 2021-05-10 DIAGNOSIS — F419 Anxiety disorder, unspecified: Secondary | ICD-10-CM | POA: Insufficient documentation

## 2021-05-10 DIAGNOSIS — Z981 Arthrodesis status: Secondary | ICD-10-CM | POA: Insufficient documentation

## 2021-05-10 DIAGNOSIS — D259 Leiomyoma of uterus, unspecified: Secondary | ICD-10-CM | POA: Insufficient documentation

## 2021-05-10 DIAGNOSIS — M5126 Other intervertebral disc displacement, lumbar region: Secondary | ICD-10-CM | POA: Insufficient documentation

## 2021-05-10 NOTE — Progress Notes (Signed)
[facility] [place]   Reason for referral: Achy medial left thigh without injury  History of Present Illness  Jennifer Mullins is a 50 y.o. female who presents with chief complaint: swollen leg.  Patient notes, onset of symptoms years ago, associated with ADL's.  The patient has had a history of UE DVT and PE, no history of varicose vein, no history of venous stasis ulcers, no history of  Lymphedema and no history of skin changes in lower legs.  There is no family history of venous disorders.  The patient has not used compression stockings in the past.  She has a few areas of "spider veins" left medial and lateral thigh.  She denise edema.   Past medical history includes: fibromyalgia, menorrhagia, hypertension, iron deficiency anemia, COPD/asthma, fibromyalgia, toxic multinodular goiter.  Iron deficiency anemia and episodes of blood loss after D&C.  She is no longer on anticoagulation that was discontinued by her PCP.    Past Medical History:  Diagnosis Date   Anxiety    Arthritis    Asthma    Bilateral calcaneal spurs    Bilateral polycystic ovarian syndrome    Bulging lumbar disc    Carpal tunnel syndrome, bilateral    COPD (chronic obstructive pulmonary disease) (HCC)    BRONCHITIS   Diabetes mellitus without complication (HCC)    TYPE 2   DX  4-5 YRS AGO   ETD (Eustachian tube dysfunction), bilateral    left worse than right   Fibromyalgia    GAD (generalized anxiety disorder)    GERD (gastroesophageal reflux disease)    TAKES PRESCRIPTION MEDS   Glaucoma    BOTH EYES   Hyperlipidemia    Hypertension    Hypothyroidism    NODULES ON THYROID   PONV (postoperative nausea and vomiting)    Pulmonary embolism (HCC)    Tachycardia    TMJ (dislocation of temporomandibular joint)    Trigger finger    Trigger finger of left thumb     Past Surgical History:  Procedure Laterality Date   ANTERIOR CERVICAL DECOMP/DISCECTOMY FUSION N/A 11/13/2016   Procedure:  Cervical five-six, Cervical six-seven Anterior Cervical Discectomy and Fusion, Allograft, Plate;  Surgeon: Marybelle Killings, MD;  Location: [facility];  Service: Orthopedics;  Laterality: N/A;   BREAST SURGERY     CARPAL TUNNEL RELEASE     DILATION AND CURETTAGE OF UTERUS     EXPLORATORY LAPAROTOMY     LIPOMA EXCISION Left 06/15/2018   Procedure: LEFT CALF LIPOMA EXCISION;  Surgeon: Marybelle Killings, MD;  Location: [facility];  Service: Orthopedics;  Laterality: Left;   NASAL SINUS SURGERY  07/22/13   Dr. Redmond Pulling in [place]   plate and six screws in back     REDUCTION MAMMAPLASTY Bilateral 2000   TRIGGER FINGER RELEASE Right 06/15/2018   Procedure: RIGHT TRIGGER THUMB RELEASE;  Surgeon: Marybelle Killings, MD;  Location: [place];  Service: Orthopedics;  Laterality: Right;    Social History   Socioeconomic History   Marital status: Married    Spouse name: Not on file   Number of children: Not on file   Years of education: Not on file   Highest education level: Not on file  Occupational History   Occupation: Unemployed  Tobacco Use   Smoking status: Former    Packs/day: 0.25    Years: 5.00    Pack years: 1.25    Types: Cigarettes    Quit date:  11/18/1989    Years since quitting: 31.4   Smokeless tobacco: Never  Vaping Use   Vaping Use: Never used  Substance and Sexual Activity   Alcohol use: No   Drug use: No   Sexual activity: Yes    Birth control/protection: None  Other Topics Concern   Not on file  Social History Narrative   Not on file   Social Determinants of Health   Financial Resource Strain: Not on file  Food Insecurity: Not on file  Transportation Needs: Not on file  Physical Activity: Not on file  Stress: Not on file  Social Connections: Not on file  Intimate Partner Violence: Not on file    Family History  Problem Relation Age of Onset   Hypertension Mother    Hypertension Father    Cancer Son    Colon cancer Neg Hx    Rectal cancer Neg Hx     Stomach cancer Neg Hx    Esophageal cancer Neg Hx     Current Outpatient Medications on File Prior to Visit  Medication Sig Dispense Refill   albuterol (VENTOLIN HFA) 108 (90 Base) MCG/ACT inhaler INHALE TWO PUFFS INTO THE LUNGS EVERY 6 HOURS AS NEEDED FOR WHEEZING OR SHORTNESS OF BREATH 8.5 g 5   ALPRAZolam (XANAX) 0.5 MG tablet TAKE 1 TABLET BY MOUTH TWICE DAILY AS NEEDED. MAY TAKE ONE MORE TABLET BEFORE FOR ALLERGY SHOTS ONCE A WEEK (30 DAY supply) 65 tablet 5   aspirin 81 MG chewable tablet Chew 81 mg by mouth daily. Take 2 tablets for a total of 162 mg daily     AUVI-Q 0.3 MG/0.3ML SOAJ injection Use for life threatening allergic reactions 4 each 3   azelastine (OPTIVAR) 0.05 % ophthalmic solution Apply 1 drop to eye 2 (two) times daily.     brimonidine-timolol (COMBIGAN) 0.2-0.5 % ophthalmic solution Apply 1 drop to eye 2 (two) times daily.     budesonide (RHINOCORT AQUA) 32 MCG/ACT nasal spray 1-2 spray in each nostril daily 5 mL 5   budesonide-formoterol (SYMBICORT) 80-4.5 MCG/ACT inhaler Inhale 2 puffs into the lungs daily. 1 Inhaler 5   cetirizine (ZYRTEC) 10 MG tablet TAKE 1 TABLET BY MOUTH TWICE DAILY 60 tablet 2   cromolyn (OPTICROM) 4 % ophthalmic solution Place 1 drop into both eyes 4 (four) times daily.   1   cycloSPORINE (RESTASIS) 0.05 % ophthalmic emulsion Place 1 drop into both eyes 2 (two) times daily.     cyproheptadine (PERIACTIN) 4 MG tablet Take 4 mg by mouth. 1/2 To 1 tablet nightly     diclofenac (VOLTAREN) 0.1 % ophthalmic solution 1 drop 2 (two) times daily.     diclofenac Sodium (VOLTAREN) 1 % GEL Apply 2 g topically 4 (four) times daily.     Diethylpropion HCl CR 75 MG TB24 Take 1 tablet (75 mg total) by mouth daily. 30 tablet 5   Epinastine HCl 0.05 % ophthalmic solution Apply 1 drop to eye 2 (two) times daily.     fluorometholone (FML) 0.1 % ophthalmic suspension SMARTSIG:1 Drop(s) In Eye(s) 4 Times Daily PRN     HYDROcodone-acetaminophen (NORCO) 7.5-325 MG  tablet Take 1 tablet by mouth every 8 (eight) hours as needed.     linaclotide (LINZESS) 290 MCG CAPS capsule Take 1 capsule (290 mcg total) by mouth daily before breakfast. 30 capsule 5   losartan (COZAAR) 25 MG tablet Take 1 tablet (25 mg total) by mouth daily. (Patient taking differently: Take 25 mg by mouth  daily. Taking PRN per patient) 90 tablet 3   metFORMIN (GLUCOPHAGE) 500 MG tablet TAKE 1 TABLET BY MOUTH EVERY DAY WITH BREAKFAST 90 tablet 3   montelukast (SINGULAIR) 10 MG tablet TAKE 1 TABLET BY MOUTH AT BEDTIME 30 tablet 5   NUCALA 100 MG SOLR Mix and Inject 100mg  subcutaneously every 28 days 1 each 10   Olopatadine HCl 0.6 % SOLN USE TWO SPRAYS IN EACH NOSTRIL TWICE DAILY 30.5 g 4   pantoprazole (PROTONIX) 40 MG tablet TAKE 1 TABLET BY MOUTH TWICE DAILY 60 tablet 2   PATADAY 0.7 % SOLN Apply 1 drop to eye daily.     pravastatin (PRAVACHOL) 40 MG tablet Take 1 tablet (40 mg total) by mouth daily. (Patient taking differently: Take 40 mg by mouth 2 (two) times daily.) 90 tablet 1   progesterone (PROMETRIUM) 200 MG capsule Take 1 capsule (200 mg total) by mouth at bedtime. 30 capsule 11   Scar Treatment Products (CELACYN) GEL Apply 1 application topically every morning. 28 g 3   spironolactone (ALDACTONE) 50 MG tablet TAKE 1 TABLET BY MOUTH TWICE DAILY 60 tablet 5   UNABLE TO FIND Allergy shots-both arms-weekly     VANIQA 13.9 % cream APPLY TO AFFECTED AREA TWICE DAILY 45 g 11   megestrol (MEGACE) 40 MG tablet TAKE 3 TABLETS DAILY BY MOUTH FOR 5 DAYS, 2 TABLETS DAILY FOR 5 DAYS, THEN 1 TABLET DAILY (Patient not taking: Reported on 05/10/2021) 45 tablet 4   Current Facility-Administered Medications on File Prior to Visit  Medication Dose Route Frequency Provider Last Rate Last Admin   Mepolizumab SOLR 100 mg  100 mg Subcutaneous Q28 days Jiles Prows, MD   100 mg at 04/18/21 1400    Allergies as of 05/10/2021 - Review Complete 05/10/2021  Allergen Reaction Noted   Demerol [meperidine]  Shortness Of Breath 07/28/2015   Morphine and related Shortness Of Breath 02/24/2012   Other Shortness Of Breath 02/24/2012   Cardizem  [diltiazem hcl] Other (See Comments) 09/23/2018   Diltiazem hcl Hives 02/24/2012   Gabapentin Other (See Comments) 10/30/2020   Lisinopril  01/20/2017   Lyrica [pregabalin]  09/26/2017   Tramadol Other (See Comments) 04/04/2017     ROS:   General:  No weight loss, Fever, chills  HEENT: No recent headaches, no nasal bleeding, no visual changes, no sore throat  Neurologic: No dizziness, blackouts, seizures. No recent symptoms of stroke or mini- stroke. No recent episodes of slurred speech, or temporary blindness.  Cardiac: No recent episodes of chest pain/pressure, no shortness of breath at rest.  No shortness of breath with exertion.  Denies history of atrial fibrillation or irregular heartbeat  Vascular: No history of rest pain in feet.  No history of claudication.  No history of non-healing ulcer, positive history of DVT and PE  Pulmonary: No home oxygen, no productive cough, no hemoptysis,  No asthma or wheezing  Musculoskeletal:  [ ]  Arthritis, [ ]  Low back pain,  [x ] Joint pain  Hematologic:positive history of hypercoagulable state.  No history of easy bleeding.  Positive history of anemia  Gastrointestinal: No hematochezia or melena,  No gastroesophageal reflux, no trouble swallowing  Urinary: [ ]  chronic Kidney disease, [ ]  on HD - [ ]  MWF or [ ]  TTHS, [ ]  Burning with urination, [ ]  Frequent urination, [ ]  Difficulty urinating;   Skin: No rashes  Psychological: No history of anxiety,  No history of depression  Physical Examination  Vitals:  05/10/21 1525  BP: 109/73  Pulse: 76  Resp: 20  Temp: 98.4 F (36.9 C)  TempSrc: Temporal  SpO2: 98%  Weight: 135 lb 6.4 oz (61.4 kg)  Height: 5\' 2"  (1.575 m)    Body mass index is 24.76 kg/m.  General:  Alert and oriented, no acute distress HEENT: Normal Neck: No bruit or  JVD Pulmonary: Clear to auscultation bilaterally Cardiac: Regular Rate and Rhythm without murmur Abdomen: Soft, non-tender, non-distended, no mass, no scars Skin: No rash Extremity Pulses:  2+ radial, brachial, femoral, dorsalis pedis, posterior tibial pulses bilaterally Musculoskeletal: No deformity or edema  Neurologic: Upper and lower extremity motor 5/5 and symmetric  DATA: Venous Reflux Times  +--------------+---------+------+-----------+------------+--------+  RIGHT         Reflux NoRefluxReflux TimeDiameter cmsComments                          Yes                                   +--------------+---------+------+-----------+------------+--------+  CFV           no                                              +--------------+---------+------+-----------+------------+--------+  FV mid        no                                              +--------------+---------+------+-----------+------------+--------+  Popliteal     no                                              +--------------+---------+------+-----------+------------+--------+  GSV at SFJ              yes    >500 ms      0.54              +--------------+---------+------+-----------+------------+--------+  GSV prox thighno                            0.31              +--------------+---------+------+-----------+------------+--------+  GSV mid thigh no                            0.18              +--------------+---------+------+-----------+------------+--------+  GSV dist thighno                            0.16              +--------------+---------+------+-----------+------------+--------+  GSV at knee   no                            0.2               +--------------+---------+------+-----------+------------+--------+  GSV prox  calf no                            0.15               +--------------+---------+------+-----------+------------+--------+  SSV Pop Fossa no                            0.1               +--------------+---------+------+-----------+------------+--------+  SSV prox calf no                            0.07              +--------------+---------+------+-----------+------------+--------+      +--------------+---------+------+-----------+------------+--------+  LEFT          Reflux NoRefluxReflux TimeDiameter cmsComments                          Yes                                   +--------------+---------+------+-----------+------------+--------+  CFV           no                                              +--------------+---------+------+-----------+------------+--------+  FV mid        no                                              +--------------+---------+------+-----------+------------+--------+  Popliteal     no                                              +--------------+---------+------+-----------+------------+--------+  GSV at [facility]    no                            0.68              +--------------+---------+------+-----------+------------+--------+  GSV prox thighno                            0.52              +--------------+---------+------+-----------+------------+--------+  GSV mid thigh no                            0.32              +--------------+---------+------+-----------+------------+--------+  GSV dist thighno                            0.26              +--------------+---------+------+-----------+------------+--------+  GSV at knee   no  0.23              +--------------+---------+------+-----------+------------+--------+  GSV prox calf no                            0.24              +--------------+---------+------+-----------+------------+--------+  SSV Pop Fossa no                             0.11              +--------------+---------+------+-----------+------------+--------+  SSV prox calf no                            0.1               +--------------+---------+------+-----------+------------+--------+   Summary:  Right:  - No evidence of deep vein thrombosis seen in the right lower extremity,  from the common femoral through the popliteal veins.  - No evidence of superficial venous thrombosis in the right lower  extremity.  - Venous reflux is noted in the right sapheno-femoral junction.     Left:  - No evidence of deep vein thrombosis seen in the left lower extremity,  from the common femoral through the popliteal veins.  - No evidence of superficial venous thrombosis in the left lower  extremity.  - There is no evidence of venous reflux seen in the left lower extremity.      Assessment: Fibromyalgia Left thigh achy pain. Telangectasia left med/lat thigh No edema, no varicose veins and no DVT She has easily palpable pedal pulses B LE.  She is not at risk of limb loss.  Plan:I recommended exercise and water therapy if available.  She has normal arteries and veins.  F/U as needed in the future.  Roxy Horseman PA-C [facility] [place] Office: [phone]  MD in clinic [place]

## 2021-05-11 ENCOUNTER — Telehealth: Payer: Self-pay

## 2021-05-11 NOTE — Telephone Encounter (Signed)
Patient calls today to been seen again by Dr. Gwenette Greet. She was seen in office yesterday. She forgot to mention a numb spot on her leg and was told she has a narrowing of veins in her left leg. Advised her legs were okay and that patient did not need to be seen again right now- she should exercise, elevate, and try water therapy if available per [facility] recommendations. Patient states "I was told I can follow up as needed." She would like to be scheduled on [place] day. Advised that while patients had specific doctors, we did not assign Pas, and she would be seen by who was in the office that day. Husband states "I think you are wasting your time if you can't be seen by the doctor who saw you." She would like to be seen again regardless. Placed on PA schedule for follow up.

## 2021-05-15 ENCOUNTER — Other Ambulatory Visit: Payer: Self-pay

## 2021-05-15 ENCOUNTER — Ambulatory Visit (INDEPENDENT_AMBULATORY_CARE_PROVIDER_SITE_OTHER): Payer: 59 | Admitting: *Deleted

## 2021-05-15 DIAGNOSIS — J4550 Severe persistent asthma, uncomplicated: Secondary | ICD-10-CM | POA: Diagnosis not present

## 2021-05-16 ENCOUNTER — Ambulatory Visit: Payer: Self-pay

## 2021-05-18 ENCOUNTER — Ambulatory Visit: Payer: 59

## 2021-05-24 ENCOUNTER — Ambulatory Visit (INDEPENDENT_AMBULATORY_CARE_PROVIDER_SITE_OTHER): Payer: 59 | Admitting: *Deleted

## 2021-05-24 ENCOUNTER — Other Ambulatory Visit: Payer: 59

## 2021-05-24 DIAGNOSIS — J309 Allergic rhinitis, unspecified: Secondary | ICD-10-CM | POA: Diagnosis not present

## 2021-05-25 ENCOUNTER — Other Ambulatory Visit: Payer: 59

## 2021-05-29 ENCOUNTER — Ambulatory Visit: Payer: 59 | Admitting: Allergy and Immunology

## 2021-06-07 ENCOUNTER — Other Ambulatory Visit: Payer: Self-pay | Admitting: Allergy and Immunology

## 2021-06-07 ENCOUNTER — Ambulatory Visit (INDEPENDENT_AMBULATORY_CARE_PROVIDER_SITE_OTHER): Payer: 59 | Admitting: *Deleted

## 2021-06-07 DIAGNOSIS — J309 Allergic rhinitis, unspecified: Secondary | ICD-10-CM

## 2021-06-11 ENCOUNTER — Ambulatory Visit (INDEPENDENT_AMBULATORY_CARE_PROVIDER_SITE_OTHER): Payer: 59

## 2021-06-11 ENCOUNTER — Other Ambulatory Visit: Payer: Self-pay

## 2021-06-11 DIAGNOSIS — J4550 Severe persistent asthma, uncomplicated: Secondary | ICD-10-CM | POA: Diagnosis not present

## 2021-06-12 ENCOUNTER — Ambulatory Visit: Payer: Self-pay

## 2021-06-14 ENCOUNTER — Ambulatory Visit: Payer: 59

## 2021-06-19 ENCOUNTER — Ambulatory Visit (INDEPENDENT_AMBULATORY_CARE_PROVIDER_SITE_OTHER): Payer: 59

## 2021-06-19 ENCOUNTER — Other Ambulatory Visit: Payer: Self-pay

## 2021-06-19 DIAGNOSIS — J309 Allergic rhinitis, unspecified: Secondary | ICD-10-CM

## 2021-06-19 MED ORDER — BUDESONIDE 32 MCG/ACT NA SUSP
NASAL | 5 refills | Status: DC
Start: 2021-06-19 — End: 2021-09-04

## 2021-06-25 ENCOUNTER — Other Ambulatory Visit: Payer: Self-pay | Admitting: Family

## 2021-06-25 DIAGNOSIS — M79604 Pain in right leg: Secondary | ICD-10-CM

## 2021-06-25 DIAGNOSIS — M79605 Pain in left leg: Secondary | ICD-10-CM

## 2021-06-26 ENCOUNTER — Other Ambulatory Visit: Payer: Self-pay

## 2021-06-26 ENCOUNTER — Other Ambulatory Visit: Payer: 59

## 2021-07-06 ENCOUNTER — Other Ambulatory Visit: Payer: Self-pay

## 2021-07-06 ENCOUNTER — Ambulatory Visit
Admission: RE | Admit: 2021-07-06 | Discharge: 2021-07-06 | Disposition: A | Discharge status: Home / Self Care | Payer: 59 | Source: Ambulatory Visit | Attending: Family | Admitting: Family

## 2021-07-06 DIAGNOSIS — M79605 Pain in left leg: Secondary | ICD-10-CM

## 2021-07-06 DIAGNOSIS — M79604 Pain in right leg: Secondary | ICD-10-CM

## 2021-07-09 ENCOUNTER — Other Ambulatory Visit: Payer: Self-pay

## 2021-07-09 ENCOUNTER — Ambulatory Visit: Payer: Self-pay

## 2021-07-09 ENCOUNTER — Ambulatory Visit (INDEPENDENT_AMBULATORY_CARE_PROVIDER_SITE_OTHER): Payer: 59 | Admitting: *Deleted

## 2021-07-09 DIAGNOSIS — J4550 Severe persistent asthma, uncomplicated: Secondary | ICD-10-CM | POA: Diagnosis not present

## 2021-07-11 ENCOUNTER — Telehealth: Payer: Self-pay | Admitting: Obstetrics & Gynecology

## 2021-07-11 NOTE — Telephone Encounter (Signed)
Patient called to ask if she needed to start her 21 days today or wait til tomorrow when she is actually suppose to start on her 21 days. She is on mepolizumab and said she has had dark brown spotting during the 7 day and today on the last day, she is spotting red but it is light. She stated [facility]ure was trying to get her into menopause with these pills and wanted to make sure she took them like she needed to.She can be called back at [phone].

## 2021-07-12 ENCOUNTER — Telehealth: Payer: Self-pay | Admitting: Obstetrics & Gynecology

## 2021-07-12 NOTE — Telephone Encounter (Signed)
Pt spoke with Tish yesterday about staring her pill a day early (pt states we're "walking her in to menopause"   Pt calling to state that she took the pill last night, the bleeding is light but went from bright red to dark brown  She has questions about that & wants advisement on when to start Megace  Please advise & notify pt

## 2021-07-13 ENCOUNTER — Ambulatory Visit (INDEPENDENT_AMBULATORY_CARE_PROVIDER_SITE_OTHER): Payer: 59

## 2021-07-13 DIAGNOSIS — J309 Allergic rhinitis, unspecified: Secondary | ICD-10-CM | POA: Diagnosis not present

## 2021-07-16 NOTE — Telephone Encounter (Signed)
Spoke to patient. States she had a brown discharge but spotting is very light.  Discussed with Dr Elonda Husky and informed patient to take Prometrium as prescribed and to only take Megace is bleeding is very heavy.  Pt verbalized understanding.

## 2021-07-20 ENCOUNTER — Other Ambulatory Visit: Payer: 59

## 2021-07-27 ENCOUNTER — Ambulatory Visit (INDEPENDENT_AMBULATORY_CARE_PROVIDER_SITE_OTHER): Payer: 59

## 2021-07-27 DIAGNOSIS — J309 Allergic rhinitis, unspecified: Secondary | ICD-10-CM | POA: Diagnosis not present

## 2021-07-31 ENCOUNTER — Ambulatory Visit: Payer: 59 | Admitting: Allergy and Immunology

## 2021-08-06 ENCOUNTER — Other Ambulatory Visit: Payer: Self-pay | Admitting: Allergy and Immunology

## 2021-08-08 ENCOUNTER — Ambulatory Visit (INDEPENDENT_AMBULATORY_CARE_PROVIDER_SITE_OTHER): Payer: 59

## 2021-08-08 DIAGNOSIS — J4550 Severe persistent asthma, uncomplicated: Secondary | ICD-10-CM | POA: Diagnosis not present

## 2021-08-10 ENCOUNTER — Ambulatory Visit (INDEPENDENT_AMBULATORY_CARE_PROVIDER_SITE_OTHER): Payer: 59

## 2021-08-10 DIAGNOSIS — J309 Allergic rhinitis, unspecified: Secondary | ICD-10-CM

## 2021-08-16 ENCOUNTER — Ambulatory Visit: Payer: 59 | Admitting: Podiatry

## 2021-08-16 NOTE — Telephone Encounter (Signed)
Error

## 2021-08-21 ENCOUNTER — Ambulatory Visit (INDEPENDENT_AMBULATORY_CARE_PROVIDER_SITE_OTHER): Payer: 59 | Admitting: *Deleted

## 2021-08-21 DIAGNOSIS — J309 Allergic rhinitis, unspecified: Secondary | ICD-10-CM

## 2021-08-22 DIAGNOSIS — J301 Allergic rhinitis due to pollen: Secondary | ICD-10-CM

## 2021-08-22 NOTE — Progress Notes (Signed)
VIALS MADE. EXP 08-22-22 

## 2021-09-02 ENCOUNTER — Other Ambulatory Visit: Payer: Self-pay | Admitting: Allergy and Immunology

## 2021-09-04 ENCOUNTER — Other Ambulatory Visit: Payer: Self-pay

## 2021-09-04 ENCOUNTER — Ambulatory Visit (INDEPENDENT_AMBULATORY_CARE_PROVIDER_SITE_OTHER): Payer: 59 | Admitting: Allergy and Immunology

## 2021-09-04 ENCOUNTER — Ambulatory Visit: Payer: 59

## 2021-09-04 ENCOUNTER — Encounter: Payer: Self-pay | Admitting: Allergy and Immunology

## 2021-09-04 VITALS — BP 110/70 | HR 55 | Temp 98.2°F | Resp 18

## 2021-09-04 DIAGNOSIS — J4550 Severe persistent asthma, uncomplicated: Secondary | ICD-10-CM

## 2021-09-04 DIAGNOSIS — J4522 Mild intermittent asthma with status asthmaticus: Secondary | ICD-10-CM

## 2021-09-04 MED ORDER — CYPROHEPTADINE HCL 4 MG PO TABS: 4.0000 mg | ORAL_TABLET | Freq: Every evening | ORAL | 5 refills | Status: DC

## 2021-09-04 MED ORDER — CETIRIZINE HCL 10 MG PO TABS: 10.0000 mg | ORAL_TABLET | Freq: Two times a day (BID) | ORAL | 1 refills | Status: DC

## 2021-09-04 MED ORDER — BUDESONIDE 32 MCG/ACT NA SUSP
NASAL | 1 refills | Status: DC
Start: 2021-09-04 — End: 2021-10-10

## 2021-09-04 MED ORDER — ALBUTEROL SULFATE HFA 108 (90 BASE) MCG/ACT IN AERS: INHALATION_SPRAY | RESPIRATORY_TRACT | 1 refills | Status: DC

## 2021-09-04 MED ORDER — SYMBICORT 80-4.5 MCG/ACT IN AERO: 2.0000 | INHALATION_SPRAY | Freq: Every day | RESPIRATORY_TRACT | 1 refills | Status: DC

## 2021-09-04 NOTE — Patient Instructions (Addendum)
  1. Continue Immunotherapy.     2. Continue to Treat and prevent inflammation:   A. Budesonide - 1-2 sprays each nostril daily  B. Symbicort 80 - 2 inhalations 3-7 times per week  C. montelukast 10 mg - one tablet one time per day  D. Mepolizumab injections every 4 weeks  3. Continue to Treat and prevent reflux:   A.  Pantoprazole 40 mg twice a day  4. Treat and prevent facial pain syndrome:   A. Periactin 4 mg - 1/2 - 1 tablet at bedtime  5. If needed:   A. Patanase 2 sprays each nostril two times per day  B. OTC antihistamine - Zyrtec 10mg  1-2 times per day (dry eye?)  C. Proventil HFA or similar 2 inhalations every 4-6 hours  D. Epi-Pen  E. Eye therapy with eye doctor  6. Return to clinic in 6 months or earlier if problem

## 2021-09-04 NOTE — Progress Notes (Signed)
[place]   Follow-up Note  Referring Provider: Hector Brunswick* Primary Provider: Wannetta Sender, FNP Date of Office Visit: 09/04/2021  Subjective:   Jennifer Mullins (DOB: 1971/01/28) is a 50 y.o. female who returns to the [facility] on 09/04/2021 in re-evaluation of the following:  HPI: Jennifer Mullins presents to this clinic in reevaluation of asthma, allergic rhinoconjunctivitis/vernal conjunctivitis, history of recurrent sinusitis, history of facial pain syndrome, history of dry eye syndrome, and LPR.  Jennifer Mullins last visit to this clinic was 23 January 2021.  Jennifer Mullins asthma has been doing relatively well while using Symbicort at least 1 time per day.  She has noticed that at the nadir of Jennifer Mullins mepolizumab injection she does need to a short acting bronchodilator for some issues with some shortness of breath but overall she is very satisfied with the response she has received on Jennifer Mullins current therapy.  Jennifer Mullins nose is doing well is using budesonide spray and montelukast.  It does not sound as though she has required a systemic steroid or antibiotic for any type of airway issue since Jennifer Mullins last visit.  Jennifer Mullins facial pain syndrome and sleep is really doing well while using cyproheptadine at bedtime.  Jennifer Mullins reflux is doing very well.  She recently was diagnosed with glaucoma and is on eyedrops for that issue.  Apparently Jennifer Mullins pressure was near 40 and now it is down below 20.  Allergies as of 09/04/2021       Reactions   Demerol [meperidine] Shortness Of Breath   FLUSHING AND SHORTNESS OF BREATH   Morphine And Related Shortness Of Breath   Flushed and hot hyper Flushed and hot hyper Flushed and hot hyper Flushed and hot hyper   Other Shortness Of Breath   Flushed and hot hyper Flushed and hot hyper Flushed and hot hyper Flushed and hot hyper Flushed and hot hyper   Cardizem  [diltiazem Hcl] Other (See Comments)   Diltiazem Hcl  Hives   Gabapentin Other (See Comments)   Chest pain and elevated heart rate   Lisinopril    Caused cough   Lyrica [pregabalin]    Chest pain, elevated heart rate   Tramadol Other (See Comments)   Tears up stomach        Medication List    albuterol 108 (90 Base) MCG/ACT inhaler Commonly known as: VENTOLIN HFA INHALE TWO PUFFS INTO THE LUNGS EVERY 6 HOURS AS NEEDED FOR WHEEZING OR SHORTNESS OF BREATH   ALPRAZolam 0.5 MG tablet Commonly known as: XANAX TAKE 1 TABLET BY MOUTH TWICE DAILY AS NEEDED. MAY TAKE ONE MORE TABLET BEFORE FOR ALLERGY SHOTS ONCE A WEEK (30 DAY supply)   aspirin 81 MG chewable tablet Chew 81 mg by mouth daily. Take 2 tablets for a total of 162 mg daily   Auvi-Q 0.3 mg/0.3 mL Soaj injection Generic drug: EPINEPHrine Use for life threatening allergic reactions   azelastine 0.05 % ophthalmic solution Commonly known as: OPTIVAR Apply 1 drop to eye 2 (two) times daily.   brimonidine-timolol 0.2-0.5 % ophthalmic solution Commonly known as: COMBIGAN Apply 1 drop to eye 2 (two) times daily.   budesonide 32 MCG/ACT nasal spray Commonly known as: RHINOCORT AQUA 1-2 spray in each nostril daily   budesonide-formoterol 80-4.5 MCG/ACT inhaler Commonly known as: Symbicort Inhale 2 puffs into the lungs daily.   Celacyn Gel Apply 1 application topically every morning.   cetirizine 10 MG tablet Commonly known  as: ZYRTEC TAKE 1 TABLET BY MOUTH TWICE DAILY   cromolyn 4 % ophthalmic solution Commonly known as: OPTICROM Place 1 drop into both eyes 4 (four) times daily.   cycloSPORINE 0.05 % ophthalmic emulsion Commonly known as: RESTASIS Place 1 drop into both eyes 2 (two) times daily.   cyproheptadine 4 MG tablet Commonly known as: PERIACTIN Take 4 mg by mouth at bedtime. 1/2 to 1 tablet at bedtime   diclofenac 0.1 % ophthalmic solution Commonly known as: VOLTAREN 1 drop 2 (two) times daily.   diclofenac Sodium 1 % Gel Commonly known as:  VOLTAREN Apply 2 g topically 4 (four) times daily.   Diethylpropion HCl CR 75 MG Tb24 Take 1 tablet (75 mg total) by mouth daily.   Epinastine HCl 0.05 % ophthalmic solution Apply 1 drop to eye 2 (two) times daily.   HYDROcodone-acetaminophen 7.5-325 MG tablet Commonly known as: NORCO Take 1 tablet by mouth every 8 (eight) hours as needed.   linaclotide 290 MCG Caps capsule Commonly known as: Linzess Take 1 capsule (290 mcg total) by mouth daily before breakfast.   losartan 25 MG tablet Commonly known as: COZAAR Take 1 tablet (25 mg total) by mouth daily. What changed: additional instructions   megestrol 40 MG tablet Commonly known as: MEGACE TAKE 3 TABLETS DAILY BY MOUTH FOR 5 DAYS, 2 TABLETS DAILY FOR 5 DAYS, THEN 1 TABLET DAILY   metFORMIN 500 MG tablet Commonly known as: GLUCOPHAGE TAKE 1 TABLET BY MOUTH EVERY DAY WITH BREAKFAST   montelukast 10 MG tablet Commonly known as: SINGULAIR TAKE 1 TABLET BY MOUTH AT BEDTIME   Nucala 100 MG injection Generic drug: mepolizumab Mix and Inject 100mg  subcutaneously every 28 days   Olopatadine HCl 0.6 % Soln USE TWO SPRAYS IN EACH NOSTRIL TWICE DAILY   pantoprazole 40 MG tablet Commonly known as: PROTONIX TAKE 1 TABLET BY MOUTH TWICE DAILY   Pataday 0.7 % Soln Generic drug: Olopatadine HCl Apply 1 drop to eye daily.   pravastatin 40 MG tablet Commonly known as: PRAVACHOL Take 1 tablet (40 mg total) by mouth daily. What changed: when to take this   progesterone 200 MG capsule Commonly known as: Prometrium Take 1 capsule (200 mg total) by mouth at bedtime.   spironolactone 50 MG tablet Commonly known as: ALDACTONE TAKE 1 TABLET BY MOUTH TWICE DAILY   UNABLE TO FIND Allergy shots-both arms-weekly   Vaniqa 13.9 % cream Generic drug: Eflornithine HCl APPLY TO AFFECTED AREA TWICE DAILY    Past Medical History:  Diagnosis Date   Anxiety    Arthritis    Asthma    Bilateral calcaneal spurs    Bilateral  polycystic ovarian syndrome    Bulging lumbar disc    Carpal tunnel syndrome, bilateral    COPD (chronic obstructive pulmonary disease) (HCC)    BRONCHITIS   Diabetes mellitus without complication (HCC)    TYPE 2   DX  4-5 YRS AGO   ETD (Eustachian tube dysfunction), bilateral    left worse than right   Fibromyalgia    GAD (generalized anxiety disorder)    GERD (gastroesophageal reflux disease)    TAKES PRESCRIPTION MEDS   Glaucoma    BOTH EYES   Hyperlipidemia    Hypertension    Hypothyroidism    NODULES ON THYROID   PONV (postoperative nausea and vomiting)    Pulmonary embolism (HCC)    Tachycardia    TMJ (dislocation of temporomandibular joint)    Trigger finger  Trigger finger of left thumb     Past Surgical History:  Procedure Laterality Date   ANTERIOR CERVICAL DECOMP/DISCECTOMY FUSION N/A 11/13/2016   Procedure: Cervical five-six, Cervical six-seven Anterior Cervical Discectomy and Fusion, Allograft, Plate;  Surgeon: Marybelle Killings, MD;  Location: [facility];  Service: Orthopedics;  Laterality: N/A;   BREAST SURGERY     CARPAL TUNNEL RELEASE     DILATION AND CURETTAGE OF UTERUS     EXPLORATORY LAPAROTOMY     LIPOMA EXCISION Left 06/15/2018   Procedure: LEFT CALF LIPOMA EXCISION;  Surgeon: Marybelle Killings, MD;  Location: [facility];  Service: Orthopedics;  Laterality: Left;   NASAL SINUS SURGERY  07/22/13   Dr. Redmond Pulling in [place]   plate and six screws in back     REDUCTION MAMMAPLASTY Bilateral 2000   TRIGGER FINGER RELEASE Right 06/15/2018   Procedure: RIGHT TRIGGER THUMB RELEASE;  Surgeon: Marybelle Killings, MD;  Location: [facility];  Service: Orthopedics;  Laterality: Right;    Review of systems negative except as noted in HPI / PMHx or noted below:  Review of Systems  Constitutional: Negative.   HENT: Negative.    Eyes: Negative.   Respiratory: Negative.    Cardiovascular: Negative.   Gastrointestinal: Negative.   Genitourinary: Negative.    Musculoskeletal: Negative.   Skin: Negative.   Neurological: Negative.   Endo/Heme/Allergies: Negative.   Psychiatric/Behavioral: Negative.      Objective:   Vitals:   09/04/21 1533  BP: 110/70  Pulse: (!) 55  Resp: 18  Temp: 98.2 F (36.8 C)  SpO2: 100%          Physical Exam Constitutional:      Appearance: She is not diaphoretic.  HENT:     Head: Normocephalic.     Right Ear: Tympanic membrane, ear canal and external ear normal.     Left Ear: Tympanic membrane, ear canal and external ear normal.     Nose: Nose normal. No mucosal edema or rhinorrhea.     Mouth/Throat:     Pharynx: Uvula midline. No oropharyngeal exudate.  Eyes:     Conjunctiva/sclera: Conjunctivae normal.  Neck:     Thyroid: No thyromegaly.     Trachea: Trachea normal. No tracheal tenderness or tracheal deviation.  Cardiovascular:     Rate and Rhythm: Normal rate and regular rhythm.     Heart sounds: Normal heart sounds, S1 normal and S2 normal. No murmur heard. Pulmonary:     Effort: No respiratory distress.     Breath sounds: Normal breath sounds. No stridor. No wheezing or rales.  Lymphadenopathy:     Head:     Right side of head: No tonsillar adenopathy.     Left side of head: No tonsillar adenopathy.     Cervical: No cervical adenopathy.  Skin:    Findings: No erythema or rash.     Nails: There is no clubbing.  Neurological:     Mental Status: She is alert.    Diagnostics:    Spirometry was performed and demonstrated an FEV1 of 1.72 at 79 % of predicted.  The patient had an Asthma Control Test with the following results: ACT Total Score: 20.    Assessment and Plan:   1. Severe persistent asthma without complication   2. Mild intermittent extrinsic asthma with status asthmaticus     1. Continue Immunotherapy.     2. Continue to Treat and prevent inflammation:   A. Budesonide - 1-2  sprays each nostril daily  B. Symbicort 80 - 2 inhalations 3-7 times per week  C.  montelukast 10 mg - one tablet one time per day  D. Mepolizumab injections every 4 weeks  3. Continue to Treat and prevent reflux:   A.  Pantoprazole 40 mg twice a day  4. Treat and prevent facial pain syndrome:   A. Periactin 4 mg - 1/2 - 1 tablet at bedtime  5. If needed:   A. Patanase 2 sprays each nostril two times per day  B. OTC antihistamine - Zyrtec 10mg  1-2 times per day (dry eye?)  C. Proventil HFA or similar 2 inhalations every 4-6 hours  D. Epi-Pen  E. Eye therapy with eye doctor  6. Return to clinic in 6 months or earlier if problem  Zykerria appears to be doing very well on Jennifer Mullins current plan of anti-inflammatory agents for Jennifer Mullins upper and lower airway, therapy directed against reflux, and the use of cyproheptadine to prevent facial pain syndrome and sleep dysfunction.  I am going to keep Jennifer Mullins on this plan and assuming she does well with this therapy I will see Jennifer Mullins back in this clinic in 6 months or earlier if there is a problem.    Allena Katz, MD Allergy / [facility] [place]

## 2021-09-05 ENCOUNTER — Encounter: Payer: Self-pay | Admitting: Allergy and Immunology

## 2021-09-07 ENCOUNTER — Ambulatory Visit (INDEPENDENT_AMBULATORY_CARE_PROVIDER_SITE_OTHER): Payer: 59

## 2021-09-07 DIAGNOSIS — J309 Allergic rhinitis, unspecified: Secondary | ICD-10-CM

## 2021-09-13 ENCOUNTER — Other Ambulatory Visit: Payer: Self-pay

## 2021-09-13 ENCOUNTER — Ambulatory Visit (INDEPENDENT_AMBULATORY_CARE_PROVIDER_SITE_OTHER): Payer: 59 | Admitting: Podiatry

## 2021-09-13 DIAGNOSIS — M7751 Other enthesopathy of right foot: Secondary | ICD-10-CM

## 2021-09-13 DIAGNOSIS — M722 Plantar fascial fibromatosis: Secondary | ICD-10-CM

## 2021-09-13 MED ORDER — TRIAMCINOLONE ACETONIDE 40 MG/ML IJ SUSP
20.0000 mg | Freq: Once | INTRAMUSCULAR | Status: AC
  Administered 2021-09-13: 20 mg

## 2021-09-13 MED ORDER — DEXAMETHASONE SODIUM PHOSPHATE 120 MG/30ML IJ SOLN
2.0000 mg | Freq: Once | INTRAMUSCULAR | Status: AC
  Administered 2021-09-13: 2 mg via INTRA_ARTICULAR

## 2021-09-14 ENCOUNTER — Telehealth: Payer: Self-pay | Admitting: Podiatry

## 2021-09-14 NOTE — Telephone Encounter (Signed)
Pt left message for me to call her back.  I returned call and she is talking about resubmitting the charges for the orthotics and I explained that I am not able to do this that would be billing dept. I did forward her to the billing line.  She also said something about dropping off her old orthotics to be refurbished per Dr Milinda Pointer until we can get the new ones covered and her in to see EJ.  She is going to drop them off Monday to me so I can look at and I will discuss with Dr Milinda Pointer as what to do. Ej's next appt is not until December 9th Pt stated she could not wait that long.  Pt is to ask for me on Monday and possibly Jocelyn Lamer as well.

## 2021-09-15 NOTE — Progress Notes (Signed)
She presents today with her husband and she has questions about her orthotics.  She is also complaining of pain to the right plantar fascia and anterior ankle right.  Objective: Vital signs are stable she alert oriented x3 she discussed information with me stating that her insurance company would be happy to cover her orthotics if it were coated in such a way.  I expressed to her that certain codes are for other types of orthotics such as diabetic shoes.  She has pain on palpation of the anterior ankle.  She also has pain on palpation MucoClear tubercle right.  Assessment: Injected Kenalog to the right heel.  Injected dexamethasone to the ankle joint.  All of this after sterile Betadine skin prep.  Tolerated procedure well without complications.  I will follow-up with her on as-needed basis I also want her to see EJ to help work on her orthotics.

## 2021-09-17 ENCOUNTER — Telehealth: Payer: Self-pay | Admitting: Podiatry

## 2021-09-17 ENCOUNTER — Ambulatory Visit (INDEPENDENT_AMBULATORY_CARE_PROVIDER_SITE_OTHER): Payer: 59

## 2021-09-17 DIAGNOSIS — J309 Allergic rhinitis, unspecified: Secondary | ICD-10-CM | POA: Diagnosis not present

## 2021-09-17 NOTE — Telephone Encounter (Signed)
Pt came in to bring me the old orthotics to refurbish them as her and Dr Milinda Pointer discussed at the appt last week. She would like the arch lowered just a little in the right insert and raised in the left insert to be the same as the right. Also the right orthotic needs to be just a little longer.  As far as the new ones both need to be shortened a little and the left arch needs to be increased a little.   Per Dr Milinda Pointer I did tell pt that we are writing off the orthotics but once she gets them they are hers and she is not to be coming in to get adjustments every appt.  She keeps saying they wlll cover the inserts if we bill them as diabetic inserts ([id number]) and include the diabetic diagnosis. I did explain that the diabetic inserts are not the same as orthotics and cost a lot less. She is interested in diabetic shoes and asked me to call Ivonne at [employer] to discuss coverage. I told her I would try but I am very busy with other things that are pending and will try to get too it but I am not sure when.

## 2021-09-19 ENCOUNTER — Ambulatory Visit: Payer: 59 | Admitting: Dermatology

## 2021-09-19 ENCOUNTER — Encounter (HOSPITAL_COMMUNITY): Payer: Self-pay

## 2021-09-19 ENCOUNTER — Telehealth: Payer: Self-pay | Admitting: Podiatry

## 2021-09-19 ENCOUNTER — Emergency Department (HOSPITAL_COMMUNITY): Payer: 59

## 2021-09-19 ENCOUNTER — Other Ambulatory Visit: Payer: Self-pay

## 2021-09-19 ENCOUNTER — Emergency Department (HOSPITAL_COMMUNITY)
Admission: EM | Admit: 2021-09-19 | Discharge: 2021-09-19 | Disposition: A | Discharge status: Home / Self Care | Payer: 59 | Attending: Emergency Medicine | Admitting: Emergency Medicine

## 2021-09-19 DIAGNOSIS — J449 Chronic obstructive pulmonary disease, unspecified: Secondary | ICD-10-CM | POA: Insufficient documentation

## 2021-09-19 DIAGNOSIS — E039 Hypothyroidism, unspecified: Secondary | ICD-10-CM | POA: Insufficient documentation

## 2021-09-19 DIAGNOSIS — Z7984 Long term (current) use of oral hypoglycemic drugs: Secondary | ICD-10-CM | POA: Diagnosis not present

## 2021-09-19 DIAGNOSIS — E119 Type 2 diabetes mellitus without complications: Secondary | ICD-10-CM | POA: Insufficient documentation

## 2021-09-19 DIAGNOSIS — R0602 Shortness of breath: Secondary | ICD-10-CM | POA: Insufficient documentation

## 2021-09-19 DIAGNOSIS — Z87891 Personal history of nicotine dependence: Secondary | ICD-10-CM | POA: Insufficient documentation

## 2021-09-19 DIAGNOSIS — R0789 Other chest pain: Secondary | ICD-10-CM | POA: Diagnosis present

## 2021-09-19 DIAGNOSIS — Z7982 Long term (current) use of aspirin: Secondary | ICD-10-CM | POA: Diagnosis not present

## 2021-09-19 DIAGNOSIS — Z7951 Long term (current) use of inhaled steroids: Secondary | ICD-10-CM | POA: Diagnosis not present

## 2021-09-19 DIAGNOSIS — Z79899 Other long term (current) drug therapy: Secondary | ICD-10-CM | POA: Diagnosis not present

## 2021-09-19 DIAGNOSIS — I10 Essential (primary) hypertension: Secondary | ICD-10-CM | POA: Insufficient documentation

## 2021-09-19 DIAGNOSIS — J45909 Unspecified asthma, uncomplicated: Secondary | ICD-10-CM | POA: Diagnosis not present

## 2021-09-19 LAB — CBC WITH DIFFERENTIAL/PLATELET
Abs Immature Granulocytes: 0.01 10*3/uL (ref 0.00–0.07)
Basophils Absolute: 0 10*3/uL (ref 0.0–0.1)
Basophils Relative: 0 %
Eosinophils Absolute: 0 10*3/uL (ref 0.0–0.5)
Eosinophils Relative: 1 %
HCT: 42.3 % (ref 36.0–46.0)
Hemoglobin: 14.3 g/dL (ref 12.0–15.0)
Immature Granulocytes: 0 %
Lymphocytes Relative: 44 %
Lymphs Abs: 1.9 10*3/uL (ref 0.7–4.0)
MCH: 31.8 pg (ref 26.0–34.0)
MCHC: 33.8 g/dL (ref 30.0–36.0)
MCV: 94.2 fL (ref 80.0–100.0)
Monocytes Absolute: 0.4 10*3/uL (ref 0.1–1.0)
Monocytes Relative: 9 %
Neutro Abs: 2.1 10*3/uL (ref 1.7–7.7)
Neutrophils Relative %: 46 %
Platelets: 182 10*3/uL (ref 150–400)
RBC: 4.49 MIL/uL (ref 3.87–5.11)
RDW: 12.8 % (ref 11.5–15.5)
WBC: 4.4 10*3/uL (ref 4.0–10.5)
nRBC: 0 % (ref 0.0–0.2)

## 2021-09-19 LAB — BASIC METABOLIC PANEL
Anion gap: 7 (ref 5–15)
BUN: 11 mg/dL (ref 6–20)
CO2: 29 mmol/L (ref 22–32)
Calcium: 9.8 mg/dL (ref 8.9–10.3)
Chloride: 106 mmol/L (ref 98–111)
Creatinine, Ser: 0.72 mg/dL (ref 0.44–1.00)
GFR, Estimated: >60 mL/min (ref >60)
Glucose, Bld: 58 mg/dL — ABNORMAL LOW (ref 70–99)
Potassium: 3.5 mmol/L (ref 3.5–5.1)
Sodium: 142 mmol/L (ref 135–145)

## 2021-09-19 LAB — CBG MONITORING, ED: Glucose-Capillary: 97 mg/dL (ref 70–99)

## 2021-09-19 LAB — D-DIMER, QUANTITATIVE: D-Dimer, Quant: 0.4 ug{FEU}/mL (ref 0.00–0.50)

## 2021-09-19 LAB — TROPONIN I (HIGH SENSITIVITY)
Troponin I (High Sensitivity): <2 ng/L (ref <18)
Troponin I (High Sensitivity): 2 ng/L (ref <18)

## 2021-09-19 MED ORDER — ASPIRIN 325 MG PO TABS
325.0000 mg | ORAL_TABLET | Freq: Every day | ORAL | Status: DC
  Administered 2021-09-19: 325 mg via ORAL
  Filled 2021-09-19 (×2): qty 1

## 2021-09-19 MED ORDER — IOHEXOL 350 MG/ML SOLN
100.0000 mL | Freq: Once | INTRAVENOUS | Status: AC | PRN
  Administered 2021-09-19: 100 mL via INTRAVENOUS

## 2021-09-19 MED ORDER — IBUPROFEN 800 MG PO TABS: 800.0000 mg | ORAL_TABLET | Freq: Three times a day (TID) | ORAL | 0 refills | Status: DC | PRN

## 2021-09-19 MED ORDER — HYDROCODONE-ACETAMINOPHEN 5-325 MG PO TABS
1.0000 | ORAL_TABLET | Freq: Once | ORAL | Status: AC
  Administered 2021-09-19: 1 via ORAL
  Filled 2021-09-19: qty 1

## 2021-09-19 MED ORDER — HYDROCODONE-ACETAMINOPHEN 5-325 MG PO TABS: ORAL_TABLET | ORAL | 0 refills | Status: DC

## 2021-09-19 MED ORDER — LORAZEPAM 2 MG/ML IJ SOLN
1.0000 mg | Freq: Once | INTRAMUSCULAR | Status: AC
  Administered 2021-09-19: 1 mg via INTRAVENOUS
  Filled 2021-09-19: qty 1

## 2021-09-19 NOTE — ED Triage Notes (Signed)
Pt arrived from home via [facility] c/o chest pain that has been going on for a couple of weeks. Tonight pain felt worse, rates it as 10/10. 4 aspirin given via EMS. Pt stable at this time.

## 2021-09-19 NOTE — ED Provider Notes (Signed)
[facility] EMERGENCY DEPARTMENT Provider Note   CSN: [id number] Arrival date & time: 09/19/21  0406     History Chief Complaint  Patient presents with   Chest Pain    Jennifer Mullins is a 50 y.o. female.  HPI     This is a 50 year old female with history of anxiety, COPD, diabetes, hypertension, hyperlipidemia who presents with chest pain.  Patient reports that she woke up this morning with sharp chest pain.  She was startled from sleep by her husband.  She reports sharp anterior chest pain that does not radiate.  Currently her pain is 2 out of 10.  She is not taking anything for the pain.  Pain seems to be worse with movement.  She states she has had some intermittent chest pain over the last several weeks.  It is not exertional in nature.  She reports some associated shortness of breath.  No cough or fevers.  Past Medical History:  Diagnosis Date   Anxiety    Arthritis    Asthma    Bilateral calcaneal spurs    Bilateral polycystic ovarian syndrome    Bulging lumbar disc    Carpal tunnel syndrome, bilateral    COPD (chronic obstructive pulmonary disease) (HCC)    BRONCHITIS   Diabetes mellitus without complication (HCC)    TYPE 2   DX  4-5 YRS AGO   ETD (Eustachian tube dysfunction), bilateral    left worse than right   Fibromyalgia    GAD (generalized anxiety disorder)    GERD (gastroesophageal reflux disease)    TAKES PRESCRIPTION MEDS   Glaucoma    BOTH EYES   Hyperlipidemia    Hypertension    Hypothyroidism    NODULES ON THYROID   PONV (postoperative nausea and vomiting)    Pulmonary embolism (HCC)    Tachycardia    TMJ (dislocation of temporomandibular joint)    Trigger finger    Trigger finger of left thumb     Patient Active Problem List   Diagnosis Date Noted   Chronic anxiety 05/10/2021   Chronic constipation 05/10/2021   Diabetes mellitus without complication ([place]) 95/62/1308   Displacement of lumbar intervertebral disc without myelopathy  05/10/2021   History of arthrodesis 05/10/2021   Uterine fibroid 05/10/2021   Bulging lumbar disc 02/16/2020   Primary insomnia 02/10/2020   Numbness of left hand 07/06/2019   Rectal bleeding 02/01/2019   Acne vulgaris 02/01/2019   Hirsutism 01/29/2019   Hypokalemia 01/13/2019   Acute blood loss anemia 01/13/2019   Symptomatic anemia    TMJ (dislocation of temporomandibular joint) 08/24/2018   Fibromyalgia 07/09/2018   Trigger thumb, right thumb 06/15/2018   Lipoma of lower extremity 06/15/2018   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   Dyspareunia, female 12/11/2017   Menometrorrhagia 12/11/2017   Pain in right ankle and joints of right foot 10/29/2017   Chronic pain of right knee 10/29/2017   Iron deficiency anemia due to chronic blood loss 07/07/2017   S/P cervical spinal fusion 02/27/2017   Dysfunctional uterine bleeding 11/24/2016   Anticoagulated 11/24/2016   Allergic rhinoconjunctivitis 07/28/2015   Chronic headache 07/28/2015   PCOS (polycystic ovarian syndrome) 06/20/2015   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   Hyperlipidemia 06/20/2015   GAD (generalized anxiety disorder) 05/18/2014   GERD (gastroesophageal reflux disease) 05/18/2014   Chronic sinusitis 07/02/2013   Extrinsic asthma 05/20/2013   HTN (hypertension) 05/20/2013    Past Surgical History:  Procedure Laterality Date   ANTERIOR CERVICAL DECOMP/DISCECTOMY FUSION N/A  11/13/2016   Procedure: Cervical five-six, Cervical six-seven Anterior Cervical Discectomy and Fusion, Allograft, Plate;  Surgeon: Marybelle Killings, MD;  Location: [facility];  Service: Orthopedics;  Laterality: N/A;   BREAST SURGERY     CARPAL TUNNEL RELEASE     DILATION AND CURETTAGE OF UTERUS     EXPLORATORY LAPAROTOMY     LIPOMA EXCISION Left 06/15/2018   Procedure: LEFT CALF LIPOMA EXCISION;  Surgeon: Marybelle Killings, MD;  Location: [facility];  Service: Orthopedics;  Laterality: Left;   NASAL SINUS SURGERY  07/22/2013    Dr. Redmond Pulling in [place]   plate and six screws in back     REDUCTION MAMMAPLASTY Bilateral 2000   TRIGGER FINGER RELEASE Right 06/15/2018   Procedure: RIGHT TRIGGER THUMB RELEASE;  Surgeon: Marybelle Killings, MD;  Location: [facility];  Service: Orthopedics;  Laterality: Right;     OB History     Gravida  1   Para  1   Term      Preterm  1   AB      Living  1      SAB      IAB      Ectopic      Multiple      Live Births  1           Family History  Problem Relation Age of Onset   Hypertension Mother    Hypertension Father    Cancer Son    Colon cancer Neg Hx    Rectal cancer Neg Hx    Stomach cancer Neg Hx    Esophageal cancer Neg Hx     Social History   Tobacco Use   Smoking status: Former    Packs/day: 0.25    Years: 5.00    Pack years: 1.25    Types: Cigarettes    Quit date: 11/18/1989    Years since quitting: 31.8   Smokeless tobacco: Never  Vaping Use   Vaping Use: Never used  Substance Use Topics   Alcohol use: No   Drug use: No    Home Medications Prior to Admission medications   Medication Sig Start Date End Date Taking? Authorizing Provider  albuterol (VENTOLIN HFA) 108 (90 Base) MCG/ACT inhaler INHALE TWO PUFFS INTO THE LUNGS EVERY 6 HOURS AS NEEDED FOR WHEEZING OR SHORTNESS OF BREATH 09/04/21   Kozlow, Donnamarie Poag, MD  ALPRAZolam (XANAX) 0.5 MG tablet TAKE 1 TABLET BY MOUTH TWICE DAILY AS NEEDED. MAY TAKE ONE MORE TABLET BEFORE FOR ALLERGY SHOTS ONCE A WEEK (30 DAY supply) 01/05/20   Terald Sleeper, PA-C  aspirin 81 MG chewable tablet Chew 81 mg by mouth daily. Take 2 tablets for a total of 162 mg daily    [provider]  AUVI-Q 0.3 MG/0.3ML SOAJ injection Use for life threatening allergic reactions 11/17/19   Kozlow, Donnamarie Poag, MD  azelastine (OPTIVAR) 0.05 % ophthalmic solution Apply 1 drop to eye 2 (two) times daily. 02/27/21   [provider]  brimonidine-timolol (COMBIGAN) 0.2-0.5 % ophthalmic solution Apply 1  drop to eye 2 (two) times daily. 04/19/21   [provider]  budesonide (RHINOCORT AQUA) 32 MCG/ACT nasal spray 1-2 spray in each nostril daily 09/04/21   Kozlow, Donnamarie Poag, MD  cetirizine (ZYRTEC) 10 MG tablet Take 1 tablet (10 mg total) by mouth 2 (two) times daily. 09/04/21   Kozlow, Donnamarie Poag, MD  cromolyn (OPTICROM) 4 % ophthalmic solution Place 1 drop  into both eyes 4 (four) times daily.  03/20/17   [provider]  cycloSPORINE (RESTASIS) 0.05 % ophthalmic emulsion Place 1 drop into both eyes 2 (two) times daily.    [provider]  cyproheptadine (PERIACTIN) 4 MG tablet Take 1 tablet (4 mg total) by mouth at bedtime. 1/2 to 1 tablet at bedtime 09/04/21   Kozlow, Donnamarie Poag, MD  diclofenac (VOLTAREN) 0.1 % ophthalmic solution 1 drop 2 (two) times daily. 02/27/21   [provider]  diclofenac Sodium (VOLTAREN) 1 % GEL Apply 2 g topically 4 (four) times daily. 02/25/20   [provider]  Diethylpropion HCl CR 75 MG TB24 Take 1 tablet (75 mg total) by mouth daily. 01/05/20   Terald Sleeper, PA-C  Epinastine HCl 0.05 % ophthalmic solution Apply 1 drop to eye 2 (two) times daily. 02/20/21   [provider]  HYDROcodone-acetaminophen (NORCO) 7.5-325 MG tablet Take 1 tablet by mouth every 8 (eight) hours as needed. 10/05/20   [provider]  linaclotide Rolan Lipa) 290 MCG CAPS capsule Take 1 capsule (290 mcg total) by mouth daily before breakfast. 12/03/19   Terald Sleeper, PA-C  losartan (COZAAR) 25 MG tablet Take 1 tablet (25 mg total) by mouth daily. Patient taking differently: Take 25 mg by mouth daily. Taking PRN per patient 07/05/19   Terald Sleeper, PA-C  megestrol (MEGACE) 40 MG tablet TAKE 3 TABLETS DAILY BY MOUTH FOR 5 DAYS, 2 TABLETS DAILY FOR 5 DAYS, THEN 1 TABLET DAILY 07/27/20   Florian Buff, MD  metFORMIN (GLUCOPHAGE) 500 MG tablet TAKE 1 TABLET BY MOUTH EVERY DAY WITH BREAKFAST 07/05/19   Terald Sleeper, PA-C  montelukast (SINGULAIR) 10 MG  tablet TAKE 1 TABLET BY MOUTH AT BEDTIME 11/24/20   Kozlow, Donnamarie Poag, MD  NUCALA 100 MG SOLR Mix and Inject 100mg  subcutaneously every 28 days 10/31/20   Kozlow, Donnamarie Poag, MD  Olopatadine HCl 0.6 % SOLN USE TWO SPRAYS IN EACH NOSTRIL TWICE DAILY 02/22/20   Kozlow, Donnamarie Poag, MD  pantoprazole (PROTONIX) 40 MG tablet TAKE 1 TABLET BY MOUTH TWICE DAILY 09/03/21   Kozlow, Donnamarie Poag, MD  PATADAY 0.7 % SOLN Apply 1 drop to eye daily. 04/17/21   [provider]  pravastatin (PRAVACHOL) 40 MG tablet Take 1 tablet (40 mg total) by mouth daily. Patient taking differently: Take 40 mg by mouth 2 (two) times daily. 02/04/20   Dettinger, Fransisca Kaufmann, MD  progesterone (PROMETRIUM) 200 MG capsule Take 1 capsule (200 mg total) by mouth at bedtime. 04/02/21   Florian Buff, MD  Scar Treatment Products (CELACYN) GEL Apply 1 application topically every morning. 07/10/20   Clark-Burning, Anderson Malta, PA-C  spironolactone (ALDACTONE) 50 MG tablet TAKE 1 TABLET BY MOUTH TWICE DAILY 05/07/21   Lavonna Monarch, MD  SYMBICORT 80-4.5 MCG/ACT inhaler Inhale 2 puffs into the lungs daily. 09/04/21   Kozlow, Donnamarie Poag, MD  UNABLE TO FIND Allergy shots-both arms-weekly    [provider]  VANIQA 13.9 % cream APPLY TO AFFECTED AREA TWICE DAILY 04/03/21   Lavonna Monarch, MD    Allergies    Demerol [meperidine], Morphine and related, Other, Cardizem  [diltiazem hcl], Diltiazem hcl, Gabapentin, Lisinopril, Lyrica [pregabalin], and Tramadol  Review of Systems   Review of Systems  Constitutional:  Negative for fever.  Respiratory:  Positive for shortness of breath. Negative for chest tightness.   Cardiovascular:  Positive for chest pain.  Gastrointestinal:  Negative for abdominal pain, nausea and vomiting.  Genitourinary:  Negative for dysuria.  All other systems reviewed and are negative.  Physical Exam Updated Vital Signs BP 128/79   Pulse 62   Resp 13   Ht 1.575 m (5\' 2" )   Wt 60.8 kg   SpO2 100%   BMI 24.51 kg/m   Physical  Exam Vitals and nursing note reviewed.  Constitutional:      Appearance: She is well-developed. She is not ill-appearing.  HENT:     Head: Normocephalic and atraumatic.  Eyes:     Pupils: Pupils are equal, round, and reactive to light.  Cardiovascular:     Rate and Rhythm: Normal rate and regular rhythm.     Heart sounds: Normal heart sounds.  Pulmonary:     Effort: Pulmonary effort is normal. No respiratory distress.     Breath sounds: No wheezing.  Chest:     Chest wall: Tenderness present.  Abdominal:     General: Bowel sounds are normal.     Palpations: Abdomen is soft.  Musculoskeletal:     Cervical back: Neck supple.     Right lower leg: No tenderness. No edema.     Left lower leg: No edema.  Skin:    General: Skin is warm and dry.  Neurological:     Mental Status: She is alert and oriented to person, place, and time.  Psychiatric:        Mood and Affect: Mood normal.    ED Results / Procedures / Treatments   Labs (all labs ordered are listed, but only abnormal results are displayed) Labs Reviewed  BASIC METABOLIC PANEL - Abnormal; Notable for the following components:      Result Value   Glucose, Bld 58 (*)    All other components within normal limits  CBC WITH DIFFERENTIAL/PLATELET  D-DIMER, QUANTITATIVE (NOT AT [facility])  TROPONIN I (HIGH SENSITIVITY)  TROPONIN I (HIGH SENSITIVITY)    EKG EKG Interpretation  Date/Time:  Wednesday September 19 2021 04:16:01 EDT Ventricular Rate:  73 PR Interval:  167 QRS Duration: 99 QT Interval:  388 QTC Calculation: 428 R Axis:   71 Text Interpretation: Sinus rhythm Confirmed by Thayer Jew [phone]) on 09/19/2021 4:30:40 AM  Radiology DG Chest Portable 1 View  Result Date: 09/19/2021 CLINICAL DATA:  50 year old female with history of chest pain. EXAM: PORTABLE CHEST 1 VIEW COMPARISON:  Chest x-ray 05/13/2021. FINDINGS: Lung volumes are normal. No consolidative airspace disease. No pleural effusions. No pneumothorax.  No pulmonary nodule or mass noted. Pulmonary vasculature and the cardiomediastinal silhouette are within normal limits. IMPRESSION: No radiographic evidence of acute cardiopulmonary disease. Electronically Signed   By: Vinnie Langton M.D.   On: 09/19/2021 05:04    Procedures Procedures   Medications Ordered in ED Medications  aspirin tablet 325 mg (has no administration in time range)  LORazepam (ATIVAN) injection 1 mg (1 mg Intravenous Given 09/19/21 0648)  HYDROcodone-acetaminophen (NORCO/VICODIN) 5-325 MG per tablet 1 tablet (1 tablet Oral Given 09/19/21 6237)    ED Course  I have reviewed the triage vital signs and the nursing notes.  Pertinent labs & imaging results that were available during my care of the patient were reviewed by me and considered in my medical decision making (see chart for details).    MDM Rules/Calculators/A&P                           Patient presents with chest discomfort.  Somewhat atypical in nature.  She  is nontoxic and vital signs are reassuring.  Her EKG is completely normal with no evidence of ST elevation or ischemic changes.  She does have a history of PE.  Not currently on anticoagulation.  She cannot tell me exactly why she came off of anticoagulation.  Denies lower extremity swelling or pain.  Patient was initially given aspirin and Ativan.  Labs obtained.  There was significant delay in her initial troponin.  However, it was negative.  Given her history of PE, CT PE study was ordered.  Repeat troponin is pending.  Have low suspicion for ACS at this time.  If repeat troponin is negative, feel patient is safe for discharge home as long as other imaging is negative.  X-ray shows no evidence of pneumothorax or pneumonia.  Final Clinical Impression(s) / ED Diagnoses Final diagnoses:  None    Rx / DC Orders ED Discharge Orders     None        Rory Xiang, Barbette Hair, MD 09/19/21 [phone]

## 2021-09-19 NOTE — Discharge Instructions (Signed)
Call and make an appointment to follow-up with Dr. Harl Bowie or one of his colleagues within the next week for possible continued evaluation.

## 2021-09-19 NOTE — Telephone Encounter (Signed)
Pt left 2 messages yesterday afternoon the first was asking if I had had a chance to call Ivonne @ [employer] for the information about diabetic shoe and inserts coverage.and also if I had a chance to send the inserts back.  The second call stated to discard the first message about the diabetic shoes that we will work on that later.  I returned call and pts husband answered and stated pt was in the hospital that the ambulance took her in about 3 am last night as pt thought she was possibly having a heart attack. I told him I have not sent the inserts back as I have to discuss with Dr Milinda Pointer and he is out of the office currently but will discuss when he gets back and let pt know.

## 2021-09-25 ENCOUNTER — Telehealth: Payer: Self-pay | Admitting: Podiatry

## 2021-09-25 NOTE — Telephone Encounter (Signed)
Pt left long message asking about her orthotic status and if I had talked to Dr Milinda Pointer about pt getting diabetic shoes because if her insurance will pay she would like to get them. She said the lady Ivonne  from [employer] called her again about the diabetic shoes and she thinks they should be contacting us.  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    I returned call and left message that we are not going to be able to do the diabetic shoes in our office  that I have a rx for her to go to [facility] in  [place] to get the diabetic shoes. As far as the orthotics I was told by Dr Milinda Pointer that he discussed with you about you had to see EJ not me and I did tell him you came in and seen me and I will have to discuss with EJ as he is the certified pedorthist and has to do the adjustments.

## 2021-09-27 ENCOUNTER — Other Ambulatory Visit: Payer: Self-pay | Admitting: Family

## 2021-09-27 ENCOUNTER — Telehealth: Payer: Self-pay | Admitting: Podiatry

## 2021-09-27 DIAGNOSIS — Z1231 Encounter for screening mammogram for malignant neoplasm of breast: Secondary | ICD-10-CM

## 2021-09-27 NOTE — Telephone Encounter (Signed)
Received a 3 way call from [place] from [employer] and pt about her diabetic shoe order. She said the previous pair were denied because they were not billed as diabetic inserts and I explained they were not diabetic inserts. As far as pt getting the diabetic shoes and inserts we are not providing these for the pt and have referred her to [facility] and they will be providing the pt with inserts and shoes possibly so I do not need to get the prior authorization they would be doing that. I explained that I have faxed over the rx and pt has contacted them and was to call to schedule an appt but I was not sure if she had or not. She asked me for the number and I provided it to her. She asked if it was a dme company and I told her yes.  Mrs Knab stated she and [name] had left a message for me and that if I had not called her back to ignore the message as she misunderstood mrs Addair.

## 2021-09-27 NOTE — Telephone Encounter (Signed)
Pt returned my call and was asking about the orthotics and I told her I have to talk to EJ and he will be in tomorrow and see if he can do anything or if he feels pt needs and appt. I explained what we had discussed about the adjustments and she was confused but I have the notes documented. I told her once these are done we are not going to adjust any further that is why she should has seen EJ to begin with like Dr Milinda Pointer said. I told her that the orthotics are out of warranty since they are from last year and we cannot keep sending them to be adjusted. She then asked about the diabetic shoes and I told her I have the rx ready she asked if I could fax it to the company and she was going to call [facility] I gave her there number and faxed the prescription and office note.  She then called back and we were on the phone over 15 minutes she was asking about some form they need and I told her they need to fax it to [place]. She also asked if we contacted [facility] and I told her we had not that the place she is going is to do the prior auth not our office. She asked if I still had the paperwork she brought in with the numbers on it and I told her it went to vicki not me  because she could not find her copy. vicki did find it and I gave her the number to [facility] @ [employer] and then next thing I know she included me in a 3 way call and had to leave a message for [facility] [employer]  as she was not available.She left her a message. I explained that I could not stay on the phone as I had other work to do. She did say she is worried that the other company will get it wrong and not get an approval and I told her we got the approval and it still did not cover the orthotics. She is concerned she will not be able to get the shoes. She did mention [facility] and I told her they do not do custom inserts and have a small selection of shoes. She stated she lives close to them and I told her to stop  by and check with them if she wanted but I think the company in [place] will be a better option because they do the custom inserts and can do adjustments if needed.

## 2021-09-28 ENCOUNTER — Telehealth: Payer: Self-pay | Admitting: Podiatry

## 2021-09-28 NOTE — Telephone Encounter (Signed)
Pt left message stating that the [facility] does not accept [employer] insurance. She also checked with other places in [place] but they do not make custom diabetic inserts or take her insurance. Is there anywhere that I know will take her insurance that does the custom diabetic inserts.  I returned call and left message that those are the only places I know of and she needs to call the insurance company and see who they recommend.

## 2021-10-01 ENCOUNTER — Ambulatory Visit (INDEPENDENT_AMBULATORY_CARE_PROVIDER_SITE_OTHER): Payer: 59

## 2021-10-01 ENCOUNTER — Other Ambulatory Visit: Payer: Self-pay

## 2021-10-01 DIAGNOSIS — J4550 Severe persistent asthma, uncomplicated: Secondary | ICD-10-CM | POA: Diagnosis not present

## 2021-10-04 ENCOUNTER — Ambulatory Visit (INDEPENDENT_AMBULATORY_CARE_PROVIDER_SITE_OTHER): Payer: 59 | Admitting: *Deleted

## 2021-10-04 DIAGNOSIS — J309 Allergic rhinitis, unspecified: Secondary | ICD-10-CM | POA: Diagnosis not present

## 2021-10-08 ENCOUNTER — Ambulatory Visit: Payer: 59

## 2021-10-09 ENCOUNTER — Telehealth: Payer: Self-pay

## 2021-10-09 ENCOUNTER — Other Ambulatory Visit: Payer: Self-pay | Admitting: Allergy and Immunology

## 2021-10-09 MED ORDER — SYMBICORT 80-4.5 MCG/ACT IN AERO: 2.0000 | INHALATION_SPRAY | Freq: Every day | RESPIRATORY_TRACT | 1 refills | Status: DC

## 2021-10-09 NOTE — Telephone Encounter (Signed)
Patient called stating her Symbicort 80 (90 Day Supply) was not at the pharmacy from her last office visit in October.  Please send to [facility].

## 2021-10-09 NOTE — Telephone Encounter (Signed)
Sent in 3 month supply of symbicort to [facility] for pt

## 2021-10-10 ENCOUNTER — Other Ambulatory Visit: Payer: Self-pay | Admitting: *Deleted

## 2021-10-10 MED ORDER — SYMBICORT 80-4.5 MCG/ACT IN AERO: 2.0000 | INHALATION_SPRAY | Freq: Every day | RESPIRATORY_TRACT | 1 refills | Status: DC

## 2021-10-10 MED ORDER — OLOPATADINE HCL 0.6 % NA SOLN
2.0000 | Freq: Two times a day (BID) | NASAL | 1 refills | Status: DC
Start: 2021-10-10 — End: 2022-04-18

## 2021-10-10 MED ORDER — BUDESONIDE 32 MCG/ACT NA SUSP: NASAL | 1 refills | Status: DC

## 2021-10-10 NOTE — Telephone Encounter (Signed)
Refills have been sent in for 90 day prescriptions. Called and informed patient. Patient verbalized understanding.

## 2021-10-10 NOTE — Telephone Encounter (Signed)
Patient spoke to pharmacy and pharmacy had not received symbicort prescription. It seems prescription has been printed out. Can this be sent in electronically?  Patient states she is also needing a refill for budesonide and olapatadine for a 90 day supply. Patient states she mentioned this yesterday.  Patient is requesting ALL prescriptions to be sent  in for a 90 day supply.   [facility] - [address] [place] [address]  Best Contact number: [phone]

## 2021-10-16 ENCOUNTER — Other Ambulatory Visit: Payer: Self-pay

## 2021-10-16 ENCOUNTER — Ambulatory Visit: Payer: 59

## 2021-10-16 ENCOUNTER — Ambulatory Visit (INDEPENDENT_AMBULATORY_CARE_PROVIDER_SITE_OTHER): Payer: 59 | Admitting: Podiatry

## 2021-10-16 DIAGNOSIS — M7751 Other enthesopathy of right foot: Secondary | ICD-10-CM

## 2021-10-16 DIAGNOSIS — M76821 Posterior tibial tendinitis, right leg: Secondary | ICD-10-CM

## 2021-10-16 DIAGNOSIS — G5782 Other specified mononeuropathies of left lower limb: Secondary | ICD-10-CM

## 2021-10-16 DIAGNOSIS — M722 Plantar fascial fibromatosis: Secondary | ICD-10-CM

## 2021-10-16 DIAGNOSIS — S90122D Contusion of left lesser toe(s) without damage to nail, subsequent encounter: Secondary | ICD-10-CM

## 2021-10-17 NOTE — Progress Notes (Signed)
She presents today for follow-up of her Planter fasciitis of her right foot she states that it seems to be doing some better and currently she states that she does not feel that she needs another injection at this time.  We worked with her on her orthotics trying to get them to feel right.  At this point they are just not doing as well as I think they should.  I have authorized a no charge pair of orthotics to be made.  We will try this once again.  Assessment resolving Planter fasciitis posterior tibial tendinitis right.  Plan: Another set of orthotics will be fabricated and I will follow-up with her in about 2 months.

## 2021-10-17 NOTE — Progress Notes (Signed)
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 Patient is unsatisfied with fit and modifications. Per Dr. Milinda Pointer, patient was recasted for fabrication of one final pair.  PLAN Patient to return for fitting in four weeks. Plan of care discussed with and agreed upon by patient / caregiver.

## 2021-10-23 ENCOUNTER — Other Ambulatory Visit: Payer: Self-pay | Admitting: *Deleted

## 2021-10-24 ENCOUNTER — Ambulatory Visit (INDEPENDENT_AMBULATORY_CARE_PROVIDER_SITE_OTHER): Payer: 59

## 2021-10-24 DIAGNOSIS — J309 Allergic rhinitis, unspecified: Secondary | ICD-10-CM

## 2021-10-28 ENCOUNTER — Other Ambulatory Visit: Payer: Self-pay | Admitting: Dermatology

## 2021-10-28 DIAGNOSIS — L680 Hirsutism: Secondary | ICD-10-CM

## 2021-10-29 ENCOUNTER — Other Ambulatory Visit: Payer: Self-pay | Admitting: *Deleted

## 2021-10-29 MED ORDER — NUCALA 100 MG ~~LOC~~ SOLR: 100.0000 mg | SUBCUTANEOUS | 11 refills | Status: DC

## 2021-10-31 ENCOUNTER — Ambulatory Visit (INDEPENDENT_AMBULATORY_CARE_PROVIDER_SITE_OTHER): Payer: 59

## 2021-10-31 ENCOUNTER — Other Ambulatory Visit: Payer: Self-pay

## 2021-10-31 DIAGNOSIS — J4550 Severe persistent asthma, uncomplicated: Secondary | ICD-10-CM

## 2021-11-02 ENCOUNTER — Ambulatory Visit: Payer: 59

## 2021-11-06 ENCOUNTER — Ambulatory Visit (INDEPENDENT_AMBULATORY_CARE_PROVIDER_SITE_OTHER): Payer: 59 | Admitting: *Deleted

## 2021-11-06 DIAGNOSIS — J309 Allergic rhinitis, unspecified: Secondary | ICD-10-CM

## 2021-11-13 ENCOUNTER — Telehealth: Payer: Self-pay | Admitting: Allergy and Immunology

## 2021-11-13 MED ORDER — MONTELUKAST SODIUM 10 MG PO TABS
10.0000 mg | ORAL_TABLET | Freq: Every day | ORAL | 0 refills | Status: DC
Start: 2021-11-13 — End: 2022-02-25

## 2021-11-13 NOTE — Telephone Encounter (Signed)
Sent in rx of montelukast to [facility] informed pts husband that I just sent in her refill request

## 2021-11-13 NOTE — Telephone Encounter (Signed)
Patient called requesting refill for her montelukast. Patient is changing insurances and needs refill sent in before the new year so that it is covered. Patient states refill needs to be sent in for a 90 day supply.   [facility] - [address], [place] [address]  Patient is requesting a call once refill is sent in. Best contact number: [phone]

## 2021-11-15 ENCOUNTER — Encounter: Payer: Self-pay | Admitting: Hematology

## 2021-11-16 ENCOUNTER — Encounter: Payer: Self-pay | Admitting: Hematology

## 2021-11-16 ENCOUNTER — Ambulatory Visit
Admission: RE | Admit: 2021-11-16 | Discharge: 2021-11-16 | Disposition: A | Discharge status: Home / Self Care | Payer: 59 | Source: Ambulatory Visit | Attending: Family | Admitting: Family

## 2021-11-16 DIAGNOSIS — Z1231 Encounter for screening mammogram for malignant neoplasm of breast: Secondary | ICD-10-CM

## 2021-11-20 ENCOUNTER — Telehealth: Payer: Self-pay | Admitting: *Deleted

## 2021-11-20 ENCOUNTER — Encounter: Payer: Self-pay | Admitting: Hematology

## 2021-11-20 NOTE — Telephone Encounter (Signed)
Called patient to advised approval with new Ins and submit to [facility] speicalty due to Ins

## 2021-11-21 ENCOUNTER — Ambulatory Visit (INDEPENDENT_AMBULATORY_CARE_PROVIDER_SITE_OTHER): Payer: 59

## 2021-11-21 DIAGNOSIS — J309 Allergic rhinitis, unspecified: Secondary | ICD-10-CM | POA: Diagnosis not present

## 2021-11-28 ENCOUNTER — Encounter: Payer: Self-pay | Admitting: Hematology

## 2021-11-28 ENCOUNTER — Ambulatory Visit (INDEPENDENT_AMBULATORY_CARE_PROVIDER_SITE_OTHER): Payer: 59

## 2021-11-28 ENCOUNTER — Telehealth: Payer: Self-pay | Admitting: Podiatry

## 2021-11-28 ENCOUNTER — Other Ambulatory Visit: Payer: Self-pay

## 2021-11-28 ENCOUNTER — Ambulatory Visit: Payer: 59

## 2021-11-28 DIAGNOSIS — J4550 Severe persistent asthma, uncomplicated: Secondary | ICD-10-CM | POA: Diagnosis not present

## 2021-11-28 NOTE — Telephone Encounter (Signed)
Orthotics in.. lvm for pt to call to schedule an appt to pick them up. °

## 2021-12-03 ENCOUNTER — Telehealth: Payer: Self-pay | Admitting: Podiatry

## 2021-12-03 ENCOUNTER — Ambulatory Visit (INDEPENDENT_AMBULATORY_CARE_PROVIDER_SITE_OTHER): Payer: 59 | Admitting: Dermatology

## 2021-12-03 ENCOUNTER — Encounter: Payer: Self-pay | Admitting: Dermatology

## 2021-12-03 ENCOUNTER — Encounter: Payer: Self-pay | Admitting: Hematology

## 2021-12-03 ENCOUNTER — Other Ambulatory Visit: Payer: Self-pay

## 2021-12-03 DIAGNOSIS — L918 Other hypertrophic disorders of the skin: Secondary | ICD-10-CM

## 2021-12-03 DIAGNOSIS — Z1283 Encounter for screening for malignant neoplasm of skin: Secondary | ICD-10-CM | POA: Diagnosis not present

## 2021-12-03 DIAGNOSIS — L821 Other seborrheic keratosis: Secondary | ICD-10-CM | POA: Diagnosis not present

## 2021-12-03 DIAGNOSIS — L811 Chloasma: Secondary | ICD-10-CM

## 2021-12-03 NOTE — Patient Instructions (Signed)
Seborrheic Keratosis A seborrheic keratosis is a common, noncancerous (benign) skin growth. These growths are velvety, waxy, rough, tan, brown, or black spots that appear on the skin. These skin growths can be flat or raised, and scaly. What are the causes? The cause of this condition is not known.66                                    - -  What increases the risk? You are more likely to develop this condition if you: Have a family history of seborrheic keratosis. Are 50 or older. Are pregnant. Have had estrogen replacement therapy. What are the signs or symptoms? Symptoms of this condition include growths on the face, chest, shoulders, back, or other areas. These growths: Are usually painless, but may become irritated and itchy. Can be yellow, brown, black, or other colors. Are slightly raised or have a flat surface. Are sometimes rough or wart-like in texture. Are often velvety or waxy on the surface. Are round or oval-shaped. Often occur in groups, but may occur as a single growth. How is this diagnosed? This condition is diagnosed with a medical history and physical exam. A sample of the growth may be tested (skin biopsy). You may need to see a skin specialist (dermatologist). How is this treated? Treatment is not usually needed for this condition, unless the growths are irritated or bleed often. You may also choose to have the growths removed if you do not like their appearance. Most commonly, these growths are treated with a procedure in which liquid nitrogen is applied to "freeze" off the growth (cryosurgery). They may also be burned off with electricity (electrocautery) or removed by scraping (curettage). Follow these instructions at home: Watch your growth for any changes. Keep all follow-up visits as told by your health care provider. This is important. Do not scratch or pick at the growth or growths. This can cause them to become irritated or infected. Contact a health care  provider if: You suddenly have many new growths. Your growth bleeds, itches, or hurts. Your growth suddenly becomes larger or changes color. Summary A seborrheic keratosis is a common, noncancerous (benign) skin growth. Treatment is not usually needed for this condition, unless the growths are irritated or bleed often. Watch your growth for any changes. Contact a health care provider if you suddenly have many new growths or your growth suddenly becomes larger or changes color. Keep all follow-up visits as told by your health care provider. This is important. This information is not intended to replace advice given to you by your health care provider. Make sure you discuss any questions you have with your health care provider. Document Revised: 03/19/2018 Document Reviewed: 03/19/2018 Elsevier Patient Education  2022 [employer].

## 2021-12-03 NOTE — Telephone Encounter (Signed)
Pt left message stating she was returning my call about an appt to [facility]. She could do Wednesday or tomorrow.  I returned call and left message that I do not have any available appts for Wednesday as of now but I did have 1 available for Thursday as of now and to please call me to get scheduled.

## 2021-12-05 ENCOUNTER — Telehealth: Payer: Self-pay

## 2021-12-05 NOTE — Telephone Encounter (Signed)
Submitted a PA for Pantoprazole. Prior authorization is currently pending.   Key: [id number] PA Case ID: [id number]  Rx #: [id number]

## 2021-12-06 NOTE — Telephone Encounter (Signed)
PA has been approved for Pantoprazole. PA has been faxed to patients pharmacy, labeled, and placed in bulk scanning.

## 2021-12-07 ENCOUNTER — Other Ambulatory Visit: Payer: Self-pay

## 2021-12-07 ENCOUNTER — Ambulatory Visit: Payer: 59

## 2021-12-07 ENCOUNTER — Ambulatory Visit (INDEPENDENT_AMBULATORY_CARE_PROVIDER_SITE_OTHER): Payer: 59

## 2021-12-07 DIAGNOSIS — J309 Allergic rhinitis, unspecified: Secondary | ICD-10-CM

## 2021-12-07 DIAGNOSIS — D3610 Benign neoplasm of peripheral nerves and autonomic nervous system, unspecified: Secondary | ICD-10-CM

## 2021-12-07 DIAGNOSIS — M722 Plantar fascial fibromatosis: Secondary | ICD-10-CM

## 2021-12-07 DIAGNOSIS — M76821 Posterior tibial tendinitis, right leg: Secondary | ICD-10-CM

## 2021-12-07 DIAGNOSIS — M7730 Calcaneal spur, unspecified foot: Secondary | ICD-10-CM

## 2021-12-08 NOTE — Progress Notes (Signed)
SITUATION: Reason for Visit: Fitting and Delivery of Custom Fabricated Foot Orthoses Patient Report: Patient reports comfort and is satisfied with device.  OBJECTIVE DATA: Patient History / Diagnosis:     ICD-10-CM   1. Plantar fasciitis, bilateral  M72.2     2. Posterior tibial tendinitis of right lower extremity  M76.821     3. Calcaneal spur, unspecified laterality  M77.30     4. Neuroma  D36.10       Provided Device:  Custom Functional Foot Orthotics  GOAL OF ORTHOSIS - Improve gait - Decrease energy expenditure - Improve Balance - Provide Triplanar stability of foot complex - Facilitate motion  ACTIONS PERFORMED Patient was fit with foot orthotics trimmed to shoe last. Patient tolerated fittign procedure.   Patient was provided with verbal and written instruction and demonstration regarding donning, doffing, wear, care, proper fit, function, purpose, cleaning, and use of the orthosis and in all related precautions and risks and benefits regarding the orthosis.  Patient was also provided with verbal instruction regarding how to report any failures or malfunctions of the orthosis and necessary follow up care. Patient was also instructed to contact our office regarding any change in status that may affect the function of the orthosis.  Patient demonstrated independence with proper donning, doffing, and fit and verbalized understanding of all instructions.  PLAN: Patient is to follow up in one week or as necessary (PRN). All questions were answered and concerns addressed. Plan of care was discussed with and agreed upon by the patient.

## 2021-12-17 ENCOUNTER — Ambulatory Visit (INDEPENDENT_AMBULATORY_CARE_PROVIDER_SITE_OTHER): Payer: 59

## 2021-12-17 DIAGNOSIS — J309 Allergic rhinitis, unspecified: Secondary | ICD-10-CM | POA: Diagnosis not present

## 2021-12-19 NOTE — Telephone Encounter (Signed)
error 

## 2021-12-23 ENCOUNTER — Encounter: Payer: Self-pay | Admitting: Dermatology

## 2021-12-23 NOTE — Progress Notes (Signed)
° °  Follow-Up Visit   Subjective  Jennifer Mullins is a 51 y.o. female who presents for the following: Follow-up (Lesions under right eye and lesion on right side of neck, x months. Worried about lesions spreading on both cheeks. ).  Growth on face, neck, under breast Location:  Duration:  Quality:  Associated Signs/Symptoms: Modifying Factors:  Severity:  Timing: Context:   Objective  Well appearing patient in no apparent distress; mood and affect are within normal limits. Head - Anterior (Face) Macular monochrome pigmented Cheeks compatible with melasma.  Essentially all treatment options procedures and tranexamic acid reviewed.  Scalp Under right eye tag no treatment  Left Anterior Neck, Right Lower Eyelid Irritated skin tags which patient has requests removal.  Chest (Upper Torso, Anterior), Left Malar Cheek Somewhat of patient's facial pigmented is textured and represents flattened SKs; we will try LN2 freeze per patient request.   Seb k On and under breast no treatment     A focused examination was performed including head, neck, torso. Relevant physical exam findings are noted in the Assessment and Plan.   Assessment & Plan    Melasma Head - Anterior (Face)  Facial melasma offered TriLuma and patent deferred any treatment due to not covered by insurance. Patient stated all her areas get darker with freeze and she understands this is a risk and requested more treatment.  Encounter for screening for malignant neoplasm of skin Scalp  Skin tag (2) Right Lower Eyelid; Left Anterior Neck  Destruction of lesion - Left Anterior Neck, Right Lower Eyelid Complexity: simple   Destruction method: cryotherapy   Informed consent: discussed and consent obtained   Lesion destroyed using liquid nitrogen: Yes   Cryotherapy cycles:  3 Outcome: patient tolerated procedure well with no complications    Seborrheic keratosis (2) Chest (Upper Torso, Anterior); Left Malar  Cheek  5-second LN2 superficial freeze; patient understands risks of nonresponse, darkening, hypopigmentation.      I, Lavonna Monarch, MD, have reviewed all documentation for this visit.  The documentation on 12/23/21 for the exam, diagnosis, procedures, and orders are all accurate and complete.

## 2021-12-25 ENCOUNTER — Ambulatory Visit: Payer: 59 | Admitting: Podiatry

## 2021-12-26 ENCOUNTER — Ambulatory Visit (INDEPENDENT_AMBULATORY_CARE_PROVIDER_SITE_OTHER): Payer: 59 | Admitting: *Deleted

## 2021-12-26 ENCOUNTER — Ambulatory Visit: Payer: 59

## 2021-12-26 DIAGNOSIS — J4550 Severe persistent asthma, uncomplicated: Secondary | ICD-10-CM | POA: Diagnosis not present

## 2021-12-27 ENCOUNTER — Ambulatory Visit: Payer: Self-pay | Admitting: Podiatry

## 2021-12-27 ENCOUNTER — Ambulatory Visit: Payer: 59

## 2021-12-28 ENCOUNTER — Telehealth: Payer: Self-pay

## 2021-12-28 NOTE — Telephone Encounter (Signed)
Left a message for pt to return call. I remember her writing her next appt down on the counter in the shot room but it was not left on the counter or in the waiting area- pt took it with her.

## 2021-12-28 NOTE — Telephone Encounter (Signed)
I called the patient back to go over the note by [facility]. Her husband found her daily planner. She wanted to thank [facility] for getting back to her about the missing notebook.

## 2021-12-28 NOTE — Telephone Encounter (Signed)
Patient called because she lost her daily planner. Its pink and blue and she uses it to keep track of her xolair injections. It also had bible verses, her husband and son's name in it. She remember's logan giving her the shot on 12/26/21. She would like to know if logan or anyone else in the shot room this week may have seen it.

## 2022-01-02 ENCOUNTER — Ambulatory Visit (INDEPENDENT_AMBULATORY_CARE_PROVIDER_SITE_OTHER): Payer: 59

## 2022-01-02 DIAGNOSIS — J309 Allergic rhinitis, unspecified: Secondary | ICD-10-CM

## 2022-01-08 ENCOUNTER — Ambulatory Visit (INDEPENDENT_AMBULATORY_CARE_PROVIDER_SITE_OTHER): Payer: 59

## 2022-01-08 DIAGNOSIS — J309 Allergic rhinitis, unspecified: Secondary | ICD-10-CM

## 2022-01-16 ENCOUNTER — Ambulatory Visit (INDEPENDENT_AMBULATORY_CARE_PROVIDER_SITE_OTHER): Payer: 59

## 2022-01-16 DIAGNOSIS — J309 Allergic rhinitis, unspecified: Secondary | ICD-10-CM | POA: Diagnosis not present

## 2022-01-24 ENCOUNTER — Telehealth: Payer: Self-pay

## 2022-01-24 ENCOUNTER — Ambulatory Visit: Payer: Self-pay | Admitting: Podiatry

## 2022-01-24 ENCOUNTER — Ambulatory Visit (INDEPENDENT_AMBULATORY_CARE_PROVIDER_SITE_OTHER): Payer: 59 | Admitting: *Deleted

## 2022-01-24 ENCOUNTER — Other Ambulatory Visit: Payer: Self-pay

## 2022-01-24 DIAGNOSIS — J4550 Severe persistent asthma, uncomplicated: Secondary | ICD-10-CM | POA: Diagnosis not present

## 2022-01-24 NOTE — Telephone Encounter (Signed)
Patient stopped by to inquire about the bills she is receiving from [employer]. She states they are bills back from 2019, 2020 and 2021 that she never knew about. Patient is wanting a copy of all the bills sent to her home. ? ? ? ?

## 2022-01-28 ENCOUNTER — Ambulatory Visit (INDEPENDENT_AMBULATORY_CARE_PROVIDER_SITE_OTHER): Payer: 59

## 2022-01-28 ENCOUNTER — Ambulatory Visit: Payer: Self-pay

## 2022-01-28 DIAGNOSIS — J309 Allergic rhinitis, unspecified: Secondary | ICD-10-CM

## 2022-01-29 ENCOUNTER — Telehealth: Payer: Self-pay | Admitting: Allergy and Immunology

## 2022-01-29 NOTE — Telephone Encounter (Signed)
Pharmacy called in regards to patient's olopatadine nasal spray. Patient's insurance does not prefer the olopatadine and prefers the azelastine nasal spray. Pharmacist was wanting to know if that is okay to switch patient to at patient's request. ? ?Please advise ? ?Best contact number for pharmacy: [phone] ?

## 2022-01-29 NOTE — Telephone Encounter (Signed)
Would you like to switch nasal sprays, Dr. Neldon Mc?  ?

## 2022-01-30 MED ORDER — AZELASTINE HCL 0.1 % NA SOLN: 2.0000 | Freq: Two times a day (BID) | NASAL | 1 refills | Status: DC

## 2022-01-30 NOTE — Telephone Encounter (Signed)
Called and spoke to patient and informed her of the note per Dr. Neldon Mc. Patient stated that she has used Astelin in the past. Patient agreed to use the medication again and request a ninety day supply. I have sent the nasal spray to [facility] per her request.  ?

## 2022-01-31 ENCOUNTER — Ambulatory Visit: Payer: 59 | Admitting: Podiatry

## 2022-02-12 ENCOUNTER — Ambulatory Visit (INDEPENDENT_AMBULATORY_CARE_PROVIDER_SITE_OTHER): Payer: 59

## 2022-02-12 DIAGNOSIS — J309 Allergic rhinitis, unspecified: Secondary | ICD-10-CM

## 2022-02-17 ENCOUNTER — Other Ambulatory Visit: Payer: Self-pay | Admitting: Allergy and Immunology

## 2022-02-19 ENCOUNTER — Ambulatory Visit: Payer: 59 | Admitting: Allergy and Immunology

## 2022-02-21 ENCOUNTER — Ambulatory Visit (INDEPENDENT_AMBULATORY_CARE_PROVIDER_SITE_OTHER): Payer: 59

## 2022-02-21 DIAGNOSIS — J309 Allergic rhinitis, unspecified: Secondary | ICD-10-CM | POA: Diagnosis not present

## 2022-02-23 ENCOUNTER — Other Ambulatory Visit: Payer: Self-pay | Admitting: Allergy and Immunology

## 2022-02-28 ENCOUNTER — Ambulatory Visit (INDEPENDENT_AMBULATORY_CARE_PROVIDER_SITE_OTHER): Payer: 59

## 2022-02-28 ENCOUNTER — Ambulatory Visit: Payer: 59 | Admitting: Podiatry

## 2022-02-28 DIAGNOSIS — J309 Allergic rhinitis, unspecified: Secondary | ICD-10-CM

## 2022-03-12 ENCOUNTER — Ambulatory Visit (INDEPENDENT_AMBULATORY_CARE_PROVIDER_SITE_OTHER): Payer: 59

## 2022-03-12 DIAGNOSIS — J309 Allergic rhinitis, unspecified: Secondary | ICD-10-CM

## 2022-03-14 ENCOUNTER — Ambulatory Visit: Payer: 59 | Admitting: Podiatry

## 2022-03-14 ENCOUNTER — Encounter: Payer: Self-pay | Admitting: Podiatry

## 2022-03-14 ENCOUNTER — Ambulatory Visit: Payer: 59 | Admitting: Obstetrics & Gynecology

## 2022-03-14 DIAGNOSIS — M778 Other enthesopathies, not elsewhere classified: Secondary | ICD-10-CM

## 2022-03-14 MED ORDER — DEXAMETHASONE SODIUM PHOSPHATE 120 MG/30ML IJ SOLN
4.0000 mg | Freq: Once | INTRAMUSCULAR | Status: AC
  Administered 2022-03-14: 4 mg via INTRA_ARTICULAR

## 2022-03-14 NOTE — Progress Notes (Signed)
She presents today chief concern of bone callus to the dorsal aspect of the first metatarsal phalangeal joint she is complaining about the orthotics and that she feels that removing the forefoot pad has resulted in irritation of her toe.  The majority of the irritations on the right first metatarsophalangeal joint dorsally she points to the area and then to the left to a lesser degree. ? ?Objective: Vital signs are stable she is alert and oriented x3 she has great range of motion in the foot is rectus.  She has tenderness on range of motion of the first metatarsal phalangeal joint particularly on the dorsal aspect between the extensor tendon and the medial dorsal cutaneous nerve. ? ?I looked at the orthotics her orthotics are rectus and they are being placed in a shoe that has a curve to it.  This is resulting in lateral deviation of the foot with medial deviation of the shoe. ? ?Assessment: Capsulitis first metatarsophalangeal joint. ? ?Plan: This point injected 2 mg of dexamethasone local anesthetic to the dorsal aspect knotting injecting into the joint.  Also provided her information regarding the shoe type she should wear with those orthotics based on her foot type.  She understands and is amendable to it we will try that I will follow-up with her in a few months just to make sure she is doing well. ?

## 2022-03-21 ENCOUNTER — Ambulatory Visit: Payer: 59

## 2022-03-21 ENCOUNTER — Ambulatory Visit (INDEPENDENT_AMBULATORY_CARE_PROVIDER_SITE_OTHER): Payer: 59

## 2022-03-21 DIAGNOSIS — J4550 Severe persistent asthma, uncomplicated: Secondary | ICD-10-CM | POA: Diagnosis not present

## 2022-03-22 ENCOUNTER — Ambulatory Visit: Payer: 59

## 2022-03-25 ENCOUNTER — Ambulatory Visit (INDEPENDENT_AMBULATORY_CARE_PROVIDER_SITE_OTHER): Payer: 59

## 2022-03-25 DIAGNOSIS — J309 Allergic rhinitis, unspecified: Secondary | ICD-10-CM | POA: Diagnosis not present

## 2022-03-28 ENCOUNTER — Encounter: Payer: Self-pay | Admitting: Obstetrics & Gynecology

## 2022-03-28 ENCOUNTER — Other Ambulatory Visit (HOSPITAL_COMMUNITY)
Admission: RE | Admit: 2022-03-28 | Discharge: 2022-03-28 | Disposition: A | Discharge status: Home / Self Care | Payer: 59 | Source: Ambulatory Visit | Attending: Obstetrics & Gynecology | Admitting: Obstetrics & Gynecology

## 2022-03-28 ENCOUNTER — Ambulatory Visit (INDEPENDENT_AMBULATORY_CARE_PROVIDER_SITE_OTHER): Payer: 59 | Admitting: Obstetrics & Gynecology

## 2022-03-28 VITALS — BP 108/74 | HR 82 | Ht 62.0 in | Wt 131.0 lb

## 2022-03-28 DIAGNOSIS — N924 Excessive bleeding in the premenopausal period: Secondary | ICD-10-CM | POA: Diagnosis not present

## 2022-03-28 DIAGNOSIS — Z01419 Encounter for gynecological examination (general) (routine) without abnormal findings: Secondary | ICD-10-CM | POA: Diagnosis present

## 2022-03-28 MED ORDER — PROGESTERONE 200 MG PO CAPS: 200.0000 mg | ORAL_CAPSULE | Freq: Every day | ORAL | 11 refills | Status: DC

## 2022-03-28 NOTE — Progress Notes (Signed)
Subjective:  ?  ? Jennifer Mullins is a 51 y.o. female here for a routine exam.  No LMP recorded. Patient is perimenopausal. G1P0101 ?Birth Control Method:  menopausal ?Menstrual Calendar(currently): amenorrhiec  ?Current complaints: vasomotor symptoms.  ? ?Current acute medical issues:  no acute issues ?  ?Recent Gynecologic History ?No LMP recorded. Patient is perimenopausal. ?Last Pap: 2021,  normal ?Last mammogram: 12/22,  normal ? ?Past Medical History:  ?Diagnosis Date  ? Anxiety   ? Arthritis   ? Asthma   ? Bilateral calcaneal spurs   ? Bilateral polycystic ovarian syndrome   ? Bulging lumbar disc   ? Carpal tunnel syndrome, bilateral   ? COPD (chronic obstructive pulmonary disease) ([place])   ? BRONCHITIS  ? Diabetes mellitus without complication ([place])   ? TYPE 2   DX  4-5 YRS AGO  ? ETD (Eustachian tube dysfunction), bilateral   ? left worse than right  ? Fibromyalgia   ? GAD (generalized anxiety disorder)   ? GERD (gastroesophageal reflux disease)   ? TAKES PRESCRIPTION MEDS  ? Glaucoma   ? BOTH EYES  ? Hyperlipidemia   ? Hypertension   ? Hypothyroidism   ? NODULES ON THYROID  ? PONV (postoperative nausea and vomiting)   ? Pulmonary embolism ([place])   ? Tachycardia   ? TMJ (dislocation of temporomandibular joint)   ? Trigger finger   ? Trigger finger of left thumb   ? ? ?Past Surgical History:  ?Procedure Laterality Date  ? ANTERIOR CERVICAL DECOMP/DISCECTOMY FUSION N/A 11/13/2016  ? Procedure: Cervical five-six, Cervical six-seven Anterior Cervical Discectomy and Fusion, Allograft, Plate;  Surgeon: Marybelle Killings, MD;  Location: [facility];  Service: Orthopedics;  Laterality: N/A;  ? BREAST SURGERY    ? CARPAL TUNNEL RELEASE    ? DILATION AND CURETTAGE OF UTERUS    ? EXPLORATORY LAPAROTOMY    ? LIPOMA EXCISION Left 06/15/2018  ? Procedure: LEFT CALF LIPOMA EXCISION;  Surgeon: Marybelle Killings, MD;  Location: [place];  Service: Orthopedics;  Laterality: Left;  ? NASAL SINUS SURGERY  07/22/2013  ? Dr.  Redmond Pulling in [place]  ? plate and six screws in back    ? REDUCTION MAMMAPLASTY Bilateral 2000  ? TRIGGER FINGER RELEASE Right 06/15/2018  ? Procedure: RIGHT TRIGGER THUMB RELEASE;  Surgeon: Marybelle Killings, MD;  Location: [place];  Service: Orthopedics;  Laterality: Right;  ? ? ?OB History   ? ? Gravida  ?1  ? Para  ?1  ? Term  ?   ? Preterm  ?1  ? AB  ?   ? Living  ?1  ?  ? ? SAB  ?   ? IAB  ?   ? Ectopic  ?   ? Multiple  ?   ? Live Births  ?1  ?   ?  ?  ? ? ?Social History  ? ?Socioeconomic History  ? Marital status: Married  ?  Spouse name: Not on file  ? Number of children: Not on file  ? Years of education: Not on file  ? Highest education level: Not on file  ?Occupational History  ? Occupation: Unemployed  ?Tobacco Use  ? Smoking status: Former  ?  Packs/day: 0.25  ?  Years: 5.00  ?  Pack years: 1.25  ?  Types: Cigarettes  ?  Quit date: 11/18/1989  ?  Years since quitting: 32.3  ? Smokeless tobacco: Never  ?Vaping Use  ?  Vaping Use: Never used  ?Substance and Sexual Activity  ? Alcohol use: No  ? Drug use: No  ? Sexual activity: Yes  ?  Birth control/protection: None  ?Other Topics Concern  ? Not on file  ?Social History Narrative  ? Not on file  ? ?Social Determinants of Health  ? ?Financial Resource Strain: Unknown  ? Difficulty of Paying Living Expenses: Patient refused  ?Food Insecurity: No Food Insecurity  ? Worried About Charity fundraiser in the Last Year: Never true  ? Ran Out of Food in the Last Year: Never true  ?Transportation Needs: No Transportation Needs  ? Lack of Transportation (Medical): No  ? Lack of Transportation (Non-Medical): No  ?Physical Activity: Unknown  ? Days of Exercise per Week: Patient refused  ? Minutes of Exercise per Session: Patient refused  ?Stress: No Stress Concern Present  ? Feeling of Stress : Not at all  ?Social Connections: Socially Integrated  ? Frequency of Communication with Friends and Family: More than three times a week  ? Frequency of Social Gatherings  with Friends and Family: Once a week  ? Attends Religious Services: More than 4 times per year  ? Active Member of Clubs or Organizations: Yes  ? Attends Archivist Meetings: More than 4 times per year  ? Marital Status: Married  ? ? ?Family History  ?Problem Relation Age of Onset  ? Hypertension Mother   ? Hypertension Father   ? Cancer Son   ? Colon cancer Neg Hx   ? Rectal cancer Neg Hx   ? Stomach cancer Neg Hx   ? Esophageal cancer Neg Hx   ? ? ? ?Current Outpatient Medications:  ?  albuterol (VENTOLIN HFA) 108 (90 Base) MCG/ACT inhaler, INHALE TWO PUFFS INTO THE LUNGS EVERY 6 HOURS AS NEEDED FOR WHEEZING OR SHORTNESS OF BREATH, Disp: 54 g, Rfl: 1 ?  ALPRAZolam (XANAX) 0.5 MG tablet, TAKE 1 TABLET BY MOUTH TWICE DAILY AS NEEDED. MAY TAKE ONE MORE TABLET BEFORE FOR ALLERGY SHOTS ONCE A WEEK (30 DAY supply), Disp: 65 tablet, Rfl: 5 ?  aspirin 81 MG chewable tablet, Chew 81 mg by mouth daily. Take 2 tablets for a total of 162 mg daily, Disp: , Rfl:  ?  AUVI-Q 0.3 MG/0.3ML SOAJ injection, Use for life threatening allergic reactions, Disp: 4 each, Rfl: 3 ?  azelastine (ASTELIN) 0.1 % nasal spray, Place 2 sprays into both nostrils 2 (two) times daily., Disp: 90 mL, Rfl: 1 ?  azelastine (OPTIVAR) 0.05 % ophthalmic solution, Apply 1 drop to eye 2 (two) times daily., Disp: , Rfl:  ?  brimonidine-timolol (COMBIGAN) 0.2-0.5 % ophthalmic solution, Apply 1 drop to eye 2 (two) times daily., Disp: , Rfl:  ?  budesonide (RHINOCORT AQUA) 32 MCG/ACT nasal spray, 1-2 spray in each nostril daily, Disp: 15 mL, Rfl: 1 ?  cetirizine (ZYRTEC) 10 MG tablet, Take 1 tablet (10 mg total) by mouth 2 (two) times daily., Disp: 120 tablet, Rfl: 1 ?  cromolyn (OPTICROM) 4 % ophthalmic solution, Place 1 drop into both eyes 4 (four) times daily. , Disp: , Rfl: 1 ?  cycloSPORINE (RESTASIS) 0.05 % ophthalmic emulsion, Place 1 drop into both eyes 2 (two) times daily., Disp: , Rfl:  ?  cyproheptadine (PERIACTIN) 4 MG tablet, TAKE 1/2 TO 1  TABLET BY MOUTH AT BEDTIME, Disp: 30 tablet, Rfl: 5 ?  diclofenac (VOLTAREN) 0.1 % ophthalmic solution, 1 drop 2 (two) times daily., Disp: , Rfl:  ?  diclofenac Sodium (VOLTAREN) 1 % GEL, Apply 2 g topically 4 (four) times daily., Disp: , Rfl:  ?  Diethylpropion HCl CR 75 MG TB24, Take 1 tablet (75 mg total) by mouth daily., Disp: 30 tablet, Rfl: 5 ?  ELIQUIS 2.5 MG TABS tablet, Take 2.5 mg by mouth 2 (two) times daily., Disp: , Rfl:  ?  Epinastine HCl 0.05 % ophthalmic solution, Apply 1 drop to eye 2 (two) times daily., Disp: , Rfl:  ?  HYDROcodone-acetaminophen (NORCO) 7.5-325 MG tablet, Take 1 tablet by mouth every 6 (six) hours as needed for moderate pain., Disp: , Rfl:  ?  losartan (COZAAR) 25 MG tablet, Take 1 tablet (25 mg total) by mouth daily. (Patient taking differently: Take 25 mg by mouth daily. Taking PRN per patient), Disp: 90 tablet, Rfl: 3 ?  lubiprostone (AMITIZA) 8 MCG capsule, SMARTSIG:1 Capsule(s) By Mouth 1-4 Times Daily, Disp: , Rfl:  ?  megestrol (MEGACE) 40 MG tablet, TAKE 3 TABLETS DAILY BY MOUTH FOR 5 DAYS, 2 TABLETS DAILY FOR 5 DAYS, THEN 1 TABLET DAILY, Disp: 45 tablet, Rfl: 4 ?  mepolizumab (NUCALA) 100 MG injection, Inject 100 mg into the skin every 28 (twenty-eight) days., Disp: 1 each, Rfl: 11 ?  metFORMIN (GLUCOPHAGE) 500 MG tablet, TAKE 1 TABLET BY MOUTH EVERY DAY WITH BREAKFAST, Disp: 90 tablet, Rfl: 3 ?  montelukast (SINGULAIR) 10 MG tablet, TAKE 1 TABLET BY MOUTH AT BEDTIME, Disp: 90 tablet, Rfl: 1 ?  Olopatadine HCl 0.6 % SOLN, Place 2 sprays into both nostrils 2 (two) times daily., Disp: 91.5 g, Rfl: 1 ?  pantoprazole (PROTONIX) 40 MG tablet, TAKE 1 TABLET BY MOUTH TWICE DAILY, Disp: 60 tablet, Rfl: 5 ?  PATADAY 0.7 % SOLN, Apply 1 drop to eye daily., Disp: , Rfl:  ?  pravastatin (PRAVACHOL) 40 MG tablet, Take 1 tablet (40 mg total) by mouth daily. (Patient taking differently: Take 40 mg by mouth 2 (two) times daily.), Disp: 90 tablet, Rfl: 1 ?  Scar Treatment Products  (CELACYN) GEL, Apply 1 application topically every morning., Disp: 28 g, Rfl: 3 ?  spironolactone (ALDACTONE) 50 MG tablet, TAKE 1 TABLET BY MOUTH TWICE DAILY, Disp: 60 tablet, Rfl: 5 ?  SYMBICORT 80-4.5 MCG/ACT

## 2022-03-29 ENCOUNTER — Telehealth: Payer: Self-pay

## 2022-03-29 NOTE — Telephone Encounter (Signed)
Pt called asking to have her pharmacy changed to [facility]. Changed in chart. ?

## 2022-04-02 LAB — CYTOLOGY - PAP
Comment: NEGATIVE
Diagnosis: NEGATIVE
High risk HPV: NEGATIVE

## 2022-04-10 ENCOUNTER — Telehealth: Payer: Self-pay | Admitting: *Deleted

## 2022-04-10 NOTE — Telephone Encounter (Signed)
Pt requested pharmacy change to [facility]. Pharmacy changed. [place]

## 2022-04-11 ENCOUNTER — Ambulatory Visit (INDEPENDENT_AMBULATORY_CARE_PROVIDER_SITE_OTHER): Payer: 59

## 2022-04-11 DIAGNOSIS — J309 Allergic rhinitis, unspecified: Secondary | ICD-10-CM

## 2022-04-16 ENCOUNTER — Ambulatory Visit: Payer: 59 | Admitting: Allergy and Immunology

## 2022-04-16 ENCOUNTER — Encounter: Payer: Self-pay | Admitting: Allergy and Immunology

## 2022-04-16 VITALS — BP 110/68 | HR 80 | Temp 97.8°F | Resp 16 | Ht 62.0 in | Wt 130.2 lb

## 2022-04-16 DIAGNOSIS — G500 Trigeminal neuralgia: Secondary | ICD-10-CM

## 2022-04-16 DIAGNOSIS — J3089 Other allergic rhinitis: Secondary | ICD-10-CM

## 2022-04-16 DIAGNOSIS — K219 Gastro-esophageal reflux disease without esophagitis: Secondary | ICD-10-CM

## 2022-04-16 DIAGNOSIS — H1044 Vernal conjunctivitis: Secondary | ICD-10-CM | POA: Diagnosis not present

## 2022-04-16 DIAGNOSIS — H1010 Acute atopic conjunctivitis, unspecified eye: Secondary | ICD-10-CM

## 2022-04-16 DIAGNOSIS — J4550 Severe persistent asthma, uncomplicated: Secondary | ICD-10-CM

## 2022-04-16 DIAGNOSIS — G4720 Circadian rhythm sleep disorder, unspecified type: Secondary | ICD-10-CM

## 2022-04-16 DIAGNOSIS — J301 Allergic rhinitis due to pollen: Secondary | ICD-10-CM

## 2022-04-16 MED ORDER — AUVI-Q 0.3 MG/0.3ML IJ SOAJ: INTRAMUSCULAR | 3 refills | Status: DC

## 2022-04-16 MED ORDER — CYPROHEPTADINE HCL 4 MG PO TABS: 4.0000 mg | ORAL_TABLET | Freq: Every evening | ORAL | 5 refills | Status: DC

## 2022-04-16 MED ORDER — PANTOPRAZOLE SODIUM 40 MG PO TBEC: 40.0000 mg | DELAYED_RELEASE_TABLET | Freq: Two times a day (BID) | ORAL | 5 refills | Status: DC

## 2022-04-16 MED ORDER — MONTELUKAST SODIUM 10 MG PO TABS: 10.0000 mg | ORAL_TABLET | Freq: Every evening | ORAL | 1 refills | Status: DC

## 2022-04-16 MED ORDER — CETIRIZINE HCL 10 MG PO TABS
10.0000 mg | ORAL_TABLET | Freq: Two times a day (BID) | ORAL | 1 refills | Status: DC
Start: 2022-04-16 — End: 2022-09-30

## 2022-04-16 MED ORDER — AZELASTINE HCL 0.1 % NA SOLN
2.0000 | Freq: Two times a day (BID) | NASAL | 1 refills | Status: DC
Start: 2022-04-16 — End: 2022-10-01

## 2022-04-16 MED ORDER — SYMBICORT 80-4.5 MCG/ACT IN AERO: 2.0000 | INHALATION_SPRAY | Freq: Every day | RESPIRATORY_TRACT | 1 refills | Status: DC

## 2022-04-16 MED ORDER — BUDESONIDE 32 MCG/ACT NA SUSP
NASAL | 1 refills | Status: DC
Start: 2022-04-16 — End: 2022-10-01

## 2022-04-16 NOTE — Progress Notes (Unsigned)
[facility] - [place] [facility] [place]   Follow-up Note  Referring Provider: Hector Brunswick* Primary Provider: Wannetta Sender, FNP Date of Office Visit: 04/16/2022  Subjective:   Jennifer Mullins (DOB: 09-13-1971) is a 51 y.o. female who returns to the [facility] [place] on 04/16/2022 in re-evaluation of the following:  HPI: Jennifer Mullins presents to this clinic in evaluation of asthma, allergic rhinoconjunctivitis, vernal conjunctivitis, history of recurrent sinusitis, history of facial pain syndrome, history of dry eyes syndrome, and LPR.  Her last visit to this clinic was 04 September 2021.  Her asthma is doing relatively well at this point in time while using Symbicort mostly 1 time but occasionally twice a day and montelukast on a consistent basis and her mepolizumab injections.  And her upper airway issue is doing okay while using budesonide nasal spray and azelastine nasal spray.  Her insurance company made her change from olopatadine to azelastine.  She has not required a systemic steroid or an antibiotic for an airway issue.  Her requirement for albuterol is 2-3 times per week which works pretty well. \Her reflux is under very good control at this point in time.  Her facial pain and sleep dysfunction is under very good control at this point in time on her Periactin usually half a tablet but occasionally 1 tablet at bedtime.  Her eye issue which is being handled by her ophthalmologist is going pretty well.  She is having some problems getting into see her eye doctor because of her insurance company limitation on eye visits.  She has been having large local reactions to her immunotherapy and is currently held at 0.25 mL red vial and cannot progress past this point.  She still develops large local reactions.  They usually come up within a few hours of her injection and can sometimes last a day or 2.  Allergies as of 04/16/2022       Reactions    Demerol [meperidine] Shortness Of Breath   FLUSHING AND SHORTNESS OF BREATH   Morphine And Related Shortness Of Breath   Flushed and hot hyper Flushed and hot hyper Flushed and hot hyper Flushed and hot hyper   Other Shortness Of Breath   Flushed and hot hyper Flushed and hot hyper Flushed and hot hyper Flushed and hot hyper Flushed and hot hyper   Cardizem  [diltiazem Hcl] Other (See Comments)   Diltiazem Hcl Hives   Gabapentin Other (See Comments)   Chest pain and elevated heart rate   Lisinopril    Caused cough   Lyrica [pregabalin]    Chest pain, elevated heart rate   Tramadol Other (See Comments)   Tears up stomach        Medication List    albuterol 108 (90 Base) MCG/ACT inhaler Commonly known as: VENTOLIN HFA INHALE TWO PUFFS INTO THE LUNGS EVERY 6 HOURS AS NEEDED FOR WHEEZING OR SHORTNESS OF BREATH   ALPRAZolam 0.5 MG tablet Commonly known as: XANAX TAKE 1 TABLET BY MOUTH TWICE DAILY AS NEEDED. MAY TAKE ONE MORE TABLET BEFORE FOR ALLERGY SHOTS ONCE A WEEK (30 DAY supply)   aspirin 81 MG chewable tablet Chew 81 mg by mouth daily. Take 2 tablets for a total of 162 mg daily   Auvi-Q 0.3 mg/0.3 mL Soaj injection Generic drug: EPINEPHrine Use for life threatening allergic reactions   azelastine 0.05 % ophthalmic solution Commonly known as: OPTIVAR Apply 1 drop to eye 2 (two) times  daily.   azelastine 0.1 % nasal spray Commonly known as: ASTELIN Place 2 sprays into both nostrils 2 (two) times daily.   brimonidine-timolol 0.2-0.5 % ophthalmic solution Commonly known as: COMBIGAN Apply 1 drop to eye 2 (two) times daily.   budesonide 32 MCG/ACT nasal spray Commonly known as: RHINOCORT AQUA 1-2 spray in each nostril daily   Celacyn Gel Apply 1 application topically every morning.   cetirizine 10 MG tablet Commonly known as: ZYRTEC Take 1 tablet (10 mg total) by mouth 2 (two) times daily.   cromolyn 4 % ophthalmic solution Commonly known as:  OPTICROM Place 1 drop into both eyes 4 (four) times daily.   cycloSPORINE 0.05 % ophthalmic emulsion Commonly known as: RESTASIS Place 1 drop into both eyes 2 (two) times daily.   cyproheptadine 4 MG tablet Commonly known as: PERIACTIN TAKE 1/2 TO 1 TABLET BY MOUTH AT BEDTIME   diclofenac 0.1 % ophthalmic solution Commonly known as: VOLTAREN 1 drop 2 (two) times daily.   diclofenac Sodium 1 % Gel Commonly known as: VOLTAREN Apply 2 g topically 4 (four) times daily.   Diethylpropion HCl CR 75 MG Tb24 Take 1 tablet (75 mg total) by mouth daily.   dorzolamide-timolol 22.3-6.8 MG/ML ophthalmic solution Commonly known as: COSOPT Place 1 drop into both eyes 2 (two) times daily.   Eliquis 2.5 MG Tabs tablet Generic drug: apixaban Take 2.5 mg by mouth 2 (two) times daily.   Epinastine HCl 0.05 % ophthalmic solution Apply 1 drop to eye 2 (two) times daily.   HYDROcodone-acetaminophen 7.5-325 MG tablet Commonly known as: NORCO Take 1 tablet by mouth every 6 (six) hours as needed for moderate pain.   linaclotide 290 MCG Caps capsule Commonly known as: Linzess Take 1 capsule (290 mcg total) by mouth daily before breakfast.   losartan 25 MG tablet Commonly known as: COZAAR Take 1 tablet (25 mg total) by mouth daily. What changed: additional instructions   lubiprostone 8 MCG capsule Commonly known as: AMITIZA SMARTSIG:1 Capsule(s) By Mouth 1-4 Times Daily   megestrol 40 MG tablet Commonly known as: MEGACE TAKE 3 TABLETS DAILY BY MOUTH FOR 5 DAYS, 2 TABLETS DAILY FOR 5 DAYS, THEN 1 TABLET DAILY   metFORMIN 500 MG tablet Commonly known as: GLUCOPHAGE TAKE 1 TABLET BY MOUTH EVERY DAY WITH BREAKFAST   montelukast 10 MG tablet Commonly known as: SINGULAIR TAKE 1 TABLET BY MOUTH AT BEDTIME   Nucala 100 MG injection Generic drug: mepolizumab Inject 100 mg into the skin every 28 (twenty-eight) days.   Olopatadine HCl 0.6 % Soln Place 2 sprays into both nostrils 2 (two)  times daily.   pantoprazole 40 MG tablet Commonly known as: PROTONIX TAKE 1 TABLET BY MOUTH TWICE DAILY   Pataday 0.7 % Soln Generic drug: Olopatadine HCl Apply 1 drop to eye daily.   pravastatin 40 MG tablet Commonly known as: PRAVACHOL Take 1 tablet (40 mg total) by mouth daily. What changed: when to take this   progesterone 200 MG capsule Commonly known as: Prometrium Take 1 capsule (200 mg total) by mouth at bedtime.   spironolactone 50 MG tablet Commonly known as: ALDACTONE TAKE 1 TABLET BY MOUTH TWICE DAILY   Symbicort 80-4.5 MCG/ACT inhaler Generic drug: budesonide-formoterol Inhale 2 puffs into the lungs daily.   UNABLE TO FIND Allergy shots-both arms-weekly   Vaniqa 13.9 % cream Generic drug: Eflornithine HCl APPLY TO AFFECTED AREA TWICE DAILY   Xiidra 5 % Soln Generic drug: Lifitegrast Apply 1 drop to  eye 2 (two) times daily.    Past Medical History:  Diagnosis Date   Anxiety    Arthritis    Asthma    Bilateral calcaneal spurs    Bilateral polycystic ovarian syndrome    Bulging lumbar disc    Carpal tunnel syndrome, bilateral    COPD (chronic obstructive pulmonary disease) (HCC)    BRONCHITIS   Diabetes mellitus without complication (HCC)    TYPE 2   DX  4-5 YRS AGO   ETD (Eustachian tube dysfunction), bilateral    left worse than right   Fibromyalgia    GAD (generalized anxiety disorder)    GERD (gastroesophageal reflux disease)    TAKES PRESCRIPTION MEDS   Glaucoma    BOTH EYES   Hyperlipidemia    Hypertension    Hypothyroidism    NODULES ON THYROID   PONV (postoperative nausea and vomiting)    Pulmonary embolism (HCC)    Tachycardia    TMJ (dislocation of temporomandibular joint)    Trigger finger    Trigger finger of left thumb     Past Surgical History:  Procedure Laterality Date   ANTERIOR CERVICAL DECOMP/DISCECTOMY FUSION N/A 11/13/2016   Procedure: Cervical five-six, Cervical six-seven Anterior Cervical Discectomy and  Fusion, Allograft, Plate;  Surgeon: Marybelle Killings, MD;  Location: [facility];  Service: Orthopedics;  Laterality: N/A;   BREAST SURGERY     CARPAL TUNNEL RELEASE     DILATION AND CURETTAGE OF UTERUS     EXPLORATORY LAPAROTOMY     LIPOMA EXCISION Left 06/15/2018   Procedure: LEFT CALF LIPOMA EXCISION;  Surgeon: Marybelle Killings, MD;  Location: [facility];  Service: Orthopedics;  Laterality: Left;   NASAL SINUS SURGERY  07/22/2013   Dr. Redmond Pulling in [place]   plate and six screws in back     REDUCTION MAMMAPLASTY Bilateral 2000   TRIGGER FINGER RELEASE Right 06/15/2018   Procedure: RIGHT TRIGGER THUMB RELEASE;  Surgeon: Marybelle Killings, MD;  Location: [facility];  Service: Orthopedics;  Laterality: Right;    Review of systems negative except as noted in HPI / PMHx or noted below:  Review of Systems  Constitutional: Negative.   HENT: Negative.    Eyes: Negative.   Respiratory: Negative.    Cardiovascular: Negative.   Gastrointestinal: Negative.   Genitourinary: Negative.   Musculoskeletal: Negative.   Skin: Negative.   Neurological: Negative.   Endo/Heme/Allergies: Negative.   Psychiatric/Behavioral: Negative.      Objective:   Vitals:   04/16/22 1508  BP: 110/68  Pulse: 80  Resp: 16  Temp: 97.8 F (36.6 C)  SpO2: 100%   Height: '5\' 2"'$  (157.5 cm)  Weight: 130 lb 3.2 oz (59.1 kg)   Physical Exam Constitutional:      Appearance: She is not diaphoretic.  HENT:     Head: Normocephalic.     Right Ear: Tympanic membrane, ear canal and external ear normal.     Left Ear: Tympanic membrane, ear canal and external ear normal.     Nose: Nose normal. No mucosal edema or rhinorrhea.     Mouth/Throat:     Pharynx: Uvula midline. No oropharyngeal exudate.  Eyes:     Conjunctiva/sclera: Conjunctivae normal.  Neck:     Thyroid: No thyromegaly.     Trachea: Trachea normal. No tracheal tenderness or tracheal deviation.  Cardiovascular:     Rate and Rhythm: Normal  rate and regular rhythm.     Heart sounds: Normal  heart sounds, S1 normal and S2 normal. No murmur heard. Pulmonary:     Effort: No respiratory distress.     Breath sounds: Normal breath sounds. No stridor. No wheezing or rales.  Lymphadenopathy:     Head:     Right side of head: No tonsillar adenopathy.     Left side of head: No tonsillar adenopathy.     Cervical: No cervical adenopathy.  Skin:    Findings: No erythema or rash.     Nails: There is no clubbing.  Neurological:     Mental Status: She is alert.    Diagnostics:    Spirometry was performed and demonstrated an FEV1 of 1.58 at 73 % of predicted.   Assessment and Plan:   1. Asthma, severe persistent, well-controlled   2. Perennial allergic rhinitis   3. Seasonal allergic rhinitis due to pollen   4. Seasonal allergic conjunctivitis   5. Vernal conjunctivitis of both eyes   6. Facial pain syndrome   7. 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 LPRD (laryngopharyngeal reflux disease)     1. Continue Immunotherapy at maintenance 0.25 ml Red vial .     2. Continue to Treat and prevent inflammation:   A. OTC Budesonide/ Rhinocort or Triamcinolone/ Nasacort- 1-2 sprays each nostril daily  B. Symbicort 80 - 2 inhalations 1-2 times per day  C. montelukast 10 mg - one tablet one time per day  D. Mepolizumab injections every 4 weeks  3. Continue to Treat and prevent reflux:   A.  Pantoprazole 40 mg twice a day  4. Treat and prevent facial pain syndrome:   A. Periactin 4 mg - 1/2 - 1 tablet at bedtime  5. If needed:   A. Azelastine 2 sprays each nostril two times per day  B. OTC antihistamine - Zyrtec '10mg'$  1-2 times per day.   C. Proventil HFA or similar 2 inhalations every 4-6 hours  D. Epi-Pen  E. Eye treatment directed by eye doctor  6. Use a extra dose of Zyrtec 1 hour before immunotherapy injection  7. Return to clinic in 6 months or earlier if problem  8. Letter for disability  Dameka appears  to be doing okay on her current therapy which includes mepolizumab injections and immunotherapy.  She cannot progress past 0.25 red vial on maintenance dosing of her immunotherapy for we have tried this in the past and it is resulted in significant problems with not only large local reactions but systemic reactions.  I am going to have her take an extra Zyrtec an hour before she receives immunotherapy.  Her reflux and her sleep dysfunction and her facial pain syndrome also appear to be under very good control with the medications used above.  She is currently on disability and she would like a letter so that she can continue on disability.  Apparently her disability is based upon a totality or summation of all of her medical issues.  Allena Katz, MD Allergy / Immunology [place]

## 2022-04-16 NOTE — Patient Instructions (Addendum)
  1. Continue Immunotherapy at maintenance 0.25 ml Red vial .     2. Continue to Treat and prevent inflammation:   A. OTC Budesonide/ Rhinocort or Triamcinolone/ Nasacort- 1-2 sprays each nostril daily  B. Symbicort 80 - 2 inhalations 1-2 times per day  C. montelukast 10 mg - one tablet one time per day  D. Mepolizumab injections every 4 weeks  3. Continue to Treat and prevent reflux:   A.  Pantoprazole 40 mg twice a day  4. Treat and prevent facial pain syndrome:   A. Periactin 4 mg - 1/2 - 1 tablet at bedtime  5. If needed:   A. Azelastine 2 sprays each nostril two times per day  B. OTC antihistamine - Zyrtec '10mg'$  1-2 times per day.   C. Proventil HFA or similar 2 inhalations every 4-6 hours  D. Epi-Pen  E. Eye treatment directed by eye doctor  6. Use a extra dose of Zyrtec 1 hour before immunotherapy injection  7. Return to clinic in 6 months or earlier if problem  8. Letter for disability

## 2022-04-17 ENCOUNTER — Encounter: Payer: Self-pay | Admitting: Allergy and Immunology

## 2022-04-18 ENCOUNTER — Other Ambulatory Visit: Payer: Self-pay | Admitting: Dermatology

## 2022-04-18 ENCOUNTER — Other Ambulatory Visit: Payer: Self-pay | Admitting: Allergy and Immunology

## 2022-04-18 DIAGNOSIS — L680 Hirsutism: Secondary | ICD-10-CM

## 2022-04-19 ENCOUNTER — Ambulatory Visit (INDEPENDENT_AMBULATORY_CARE_PROVIDER_SITE_OTHER): Payer: 59 | Admitting: *Deleted

## 2022-04-19 DIAGNOSIS — J4550 Severe persistent asthma, uncomplicated: Secondary | ICD-10-CM | POA: Diagnosis not present

## 2022-04-26 ENCOUNTER — Ambulatory Visit (INDEPENDENT_AMBULATORY_CARE_PROVIDER_SITE_OTHER): Payer: 59

## 2022-04-26 DIAGNOSIS — J309 Allergic rhinitis, unspecified: Secondary | ICD-10-CM

## 2022-05-07 ENCOUNTER — Telehealth: Payer: Self-pay | Admitting: Podiatry

## 2022-05-07 ENCOUNTER — Ambulatory Visit: Payer: 59 | Admitting: Podiatry

## 2022-05-07 ENCOUNTER — Telehealth: Payer: Self-pay

## 2022-05-07 NOTE — Telephone Encounter (Signed)
Spoke w/ pt also and advised her on the different shoe options per Dr. Milinda Pointer and also let her know to take orthotics w/ her to try on shoes. Pt had ordered shoes from online.

## 2022-05-07 NOTE — Telephone Encounter (Signed)
Pt is refusing to see Aaron Edelman but she wants know if the shoes she ordered are the right ones, Womens' Launch 9 speed Neutral type. Please advise.

## 2022-05-07 NOTE — Telephone Encounter (Signed)
Called patient back as she had confusion regarding orthotic instructions. Walked patient through using the full foot orthotics in shoes. Scheduled patient for follow up appointment at her convenience. All questions answered and concerns addressed. Patient satisfied.

## 2022-05-07 NOTE — Telephone Encounter (Signed)
Pt purchased Brooks neutral shoes that were suggested, 2 different sizes, neither shoe feels right w/ the orthotics. She had an appt today,6/20, but cxld. She would like to be seen at another time, next available appt is not until 7/18. Pt stated that appt would be too late to return a pair of shoes; the shoes were purchased 2 wks ago. Please advise.

## 2022-05-08 ENCOUNTER — Ambulatory Visit (INDEPENDENT_AMBULATORY_CARE_PROVIDER_SITE_OTHER): Payer: 59

## 2022-05-08 DIAGNOSIS — J309 Allergic rhinitis, unspecified: Secondary | ICD-10-CM | POA: Diagnosis not present

## 2022-05-13 ENCOUNTER — Ambulatory Visit: Payer: 59

## 2022-05-13 DIAGNOSIS — M778 Other enthesopathies, not elsewhere classified: Secondary | ICD-10-CM

## 2022-05-14 NOTE — Progress Notes (Signed)
VIALS EXP 05-15-23

## 2022-05-15 DIAGNOSIS — J3089 Other allergic rhinitis: Secondary | ICD-10-CM

## 2022-05-16 ENCOUNTER — Other Ambulatory Visit: Payer: 59

## 2022-05-17 ENCOUNTER — Ambulatory Visit (INDEPENDENT_AMBULATORY_CARE_PROVIDER_SITE_OTHER): Payer: 59 | Admitting: *Deleted

## 2022-05-17 DIAGNOSIS — J4550 Severe persistent asthma, uncomplicated: Secondary | ICD-10-CM

## 2022-05-23 ENCOUNTER — Ambulatory Visit (INDEPENDENT_AMBULATORY_CARE_PROVIDER_SITE_OTHER): Payer: 59

## 2022-05-23 DIAGNOSIS — J309 Allergic rhinitis, unspecified: Secondary | ICD-10-CM

## 2022-06-03 ENCOUNTER — Ambulatory Visit (INDEPENDENT_AMBULATORY_CARE_PROVIDER_SITE_OTHER): Payer: 59

## 2022-06-03 DIAGNOSIS — J309 Allergic rhinitis, unspecified: Secondary | ICD-10-CM | POA: Diagnosis not present

## 2022-06-13 ENCOUNTER — Ambulatory Visit: Payer: 59 | Admitting: Podiatry

## 2022-06-14 ENCOUNTER — Ambulatory Visit (INDEPENDENT_AMBULATORY_CARE_PROVIDER_SITE_OTHER): Payer: 59

## 2022-06-14 ENCOUNTER — Ambulatory Visit: Payer: 59

## 2022-06-14 DIAGNOSIS — J4550 Severe persistent asthma, uncomplicated: Secondary | ICD-10-CM | POA: Diagnosis not present

## 2022-06-20 ENCOUNTER — Telehealth: Payer: Self-pay

## 2022-06-20 NOTE — Telephone Encounter (Signed)
Received fax from pharmacy stating Symbicort is not covered by patient's insurance. They are requesting to change to the following:  Budesonide Spiriva HandiHaler Spiriva Respimat [name]  Please advise, thank you!

## 2022-06-21 ENCOUNTER — Encounter: Payer: Self-pay | Admitting: Hematology

## 2022-06-21 ENCOUNTER — Ambulatory Visit (INDEPENDENT_AMBULATORY_CARE_PROVIDER_SITE_OTHER): Payer: 59

## 2022-06-21 DIAGNOSIS — J309 Allergic rhinitis, unspecified: Secondary | ICD-10-CM | POA: Diagnosis not present

## 2022-06-21 MED ORDER — FLUTICASONE FUROATE-VILANTEROL 200-25 MCG/ACT IN AEPB: 1.0000 | INHALATION_SPRAY | Freq: Every day | RESPIRATORY_TRACT | 2 refills | Status: DC

## 2022-06-21 MED ORDER — BUDESONIDE-FORMOTEROL FUMARATE 80-4.5 MCG/ACT IN AERO: 2.0000 | INHALATION_SPRAY | Freq: Two times a day (BID) | RESPIRATORY_TRACT | 5 refills | Status: DC

## 2022-06-21 NOTE — Telephone Encounter (Signed)
Spoke with patient and she DOES NOT want to switch to [facility]. She states her insurance will cover the generic Symbicort. New rx sent to pharmacy per patient request.  Pharmacy advised not to fill Breo.

## 2022-06-21 NOTE — Addendum Note (Signed)
Addended by: Wadie Lessen on: 06/21/2022 09:03 AM   Modules accepted: Orders

## 2022-06-21 NOTE — Telephone Encounter (Signed)
Rx for [facility] sent to pharmacy.  [facility] to advise patient.

## 2022-06-25 ENCOUNTER — Encounter: Payer: Self-pay | Admitting: Hematology

## 2022-07-02 DIAGNOSIS — G8929 Other chronic pain: Secondary | ICD-10-CM | POA: Diagnosis not present

## 2022-07-02 DIAGNOSIS — I10 Essential (primary) hypertension: Secondary | ICD-10-CM | POA: Diagnosis not present

## 2022-07-02 DIAGNOSIS — K219 Gastro-esophageal reflux disease without esophagitis: Secondary | ICD-10-CM | POA: Diagnosis not present

## 2022-07-02 DIAGNOSIS — E119 Type 2 diabetes mellitus without complications: Secondary | ICD-10-CM | POA: Diagnosis not present

## 2022-07-02 DIAGNOSIS — M5126 Other intervertebral disc displacement, lumbar region: Secondary | ICD-10-CM | POA: Diagnosis not present

## 2022-07-02 DIAGNOSIS — J45909 Unspecified asthma, uncomplicated: Secondary | ICD-10-CM | POA: Diagnosis not present

## 2022-07-02 DIAGNOSIS — E785 Hyperlipidemia, unspecified: Secondary | ICD-10-CM | POA: Diagnosis not present

## 2022-07-02 DIAGNOSIS — R69 Illness, unspecified: Secondary | ICD-10-CM | POA: Diagnosis not present

## 2022-07-05 ENCOUNTER — Ambulatory Visit (INDEPENDENT_AMBULATORY_CARE_PROVIDER_SITE_OTHER): Payer: 59

## 2022-07-05 DIAGNOSIS — J309 Allergic rhinitis, unspecified: Secondary | ICD-10-CM | POA: Diagnosis not present

## 2022-07-08 ENCOUNTER — Other Ambulatory Visit: Payer: Self-pay | Admitting: Dermatology

## 2022-07-08 DIAGNOSIS — L680 Hirsutism: Secondary | ICD-10-CM

## 2022-07-11 ENCOUNTER — Other Ambulatory Visit: Payer: 59

## 2022-07-12 ENCOUNTER — Ambulatory Visit (INDEPENDENT_AMBULATORY_CARE_PROVIDER_SITE_OTHER): Payer: 59

## 2022-07-12 ENCOUNTER — Other Ambulatory Visit: Payer: Self-pay | Admitting: Allergy and Immunology

## 2022-07-12 DIAGNOSIS — J4550 Severe persistent asthma, uncomplicated: Secondary | ICD-10-CM

## 2022-07-17 ENCOUNTER — Emergency Department (HOSPITAL_COMMUNITY)
Admission: EM | Admit: 2022-07-17 | Discharge: 2022-07-17 | Disposition: A | Discharge status: Home / Self Care | Payer: 59 | Attending: Emergency Medicine | Admitting: Emergency Medicine

## 2022-07-17 ENCOUNTER — Encounter: Payer: Self-pay | Admitting: Hematology

## 2022-07-17 ENCOUNTER — Emergency Department (HOSPITAL_COMMUNITY): Payer: 59

## 2022-07-17 ENCOUNTER — Other Ambulatory Visit: Payer: Self-pay

## 2022-07-17 ENCOUNTER — Encounter (HOSPITAL_COMMUNITY): Payer: Self-pay | Admitting: *Deleted

## 2022-07-17 DIAGNOSIS — M79605 Pain in left leg: Secondary | ICD-10-CM | POA: Diagnosis not present

## 2022-07-17 DIAGNOSIS — Z7901 Long term (current) use of anticoagulants: Secondary | ICD-10-CM | POA: Insufficient documentation

## 2022-07-17 DIAGNOSIS — Z86718 Personal history of other venous thrombosis and embolism: Secondary | ICD-10-CM | POA: Diagnosis not present

## 2022-07-17 DIAGNOSIS — R0602 Shortness of breath: Secondary | ICD-10-CM | POA: Diagnosis not present

## 2022-07-17 DIAGNOSIS — M79662 Pain in left lower leg: Secondary | ICD-10-CM | POA: Diagnosis not present

## 2022-07-17 DIAGNOSIS — Z7982 Long term (current) use of aspirin: Secondary | ICD-10-CM | POA: Diagnosis not present

## 2022-07-17 DIAGNOSIS — M25552 Pain in left hip: Secondary | ICD-10-CM | POA: Diagnosis not present

## 2022-07-17 HISTORY — DX: Acute embolism and thrombosis of unspecified veins of left upper extremity: I82.602

## 2022-07-17 MED ORDER — KETOROLAC TROMETHAMINE 15 MG/ML IJ SOLN
15.0000 mg | Freq: Once | INTRAMUSCULAR | Status: AC
Start: 2022-07-17 — End: 2022-07-17
  Administered 2022-07-17: 15 mg via INTRAMUSCULAR
  Filled 2022-07-17: qty 1

## 2022-07-17 NOTE — ED Notes (Signed)
Patient transported to ultrasound.

## 2022-07-17 NOTE — ED Triage Notes (Signed)
Pt c/o left calf pain since Sunday. Pt reports the pain is now going all the way up to the hip. Pt currently takes Eliquis for hx of blood clots in left lung and left arm.

## 2022-07-17 NOTE — Discharge Instructions (Signed)
Continue taking Eliquis as prescribed.  I would stick to Tylenol for pain control.  This is likely coming from the bulging disks in your back.  Like we discussed, hip x-ray was normal and there was no blood clot on ultrasound.  Please follow-up with your primary care doctor for further evaluation.  Please return to the [facility] for any worsening symptoms.

## 2022-07-17 NOTE — ED Provider Notes (Signed)
[facility] EMERGENCY DEPARTMENT Provider Note   CSN: [id number] Arrival date & time: 07/17/22  1104     History Chief Complaint  Patient presents with   Leg Pain    Jennifer Mullins is a 51 y.o. 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  She states that feels like a cramping and stinging sensation in the left lower extremity and is now radiating up into the left hip primarily when she walks.  She denies any chest pain or shortness of breath that is new.  Patient does have some shortness of breath at baseline.  No fever or chills.   Leg Pain      Home Medications Prior to Admission medications   Medication Sig Start Date End Date Taking? Authorizing Provider  albuterol (VENTOLIN HFA) 108 (90 Base) MCG/ACT inhaler INHALE TWO PUFFS INTO THE LUNGS EVERY 6 HOURS AS NEEDED FOR WHEEZING OR SHORTNESS OF BREATH 09/04/21   Kozlow, Donnamarie Poag, MD  ALPRAZolam (XANAX) 0.5 MG tablet TAKE 1 TABLET BY MOUTH TWICE DAILY AS NEEDED. MAY TAKE ONE MORE TABLET BEFORE FOR ALLERGY SHOTS ONCE A WEEK (30 DAY supply) 01/05/20   Terald Sleeper, PA-C  aspirin 81 MG chewable tablet Chew 81 mg by mouth daily. Take 2 tablets for a total of 162 mg daily    [provider]  AUVI-Q 0.3 MG/0.3ML SOAJ injection Use for life threatening allergic reactions 04/16/22   Kozlow, Donnamarie Poag, MD  azelastine (ASTELIN) 0.1 % nasal spray Place 2 sprays into both nostrils 2 (two) times daily. 04/16/22   Kozlow, Donnamarie Poag, MD  azelastine (OPTIVAR) 0.05 % ophthalmic solution Apply 1 drop to eye 2 (two) times daily. 02/27/21   [provider]  brimonidine-timolol (COMBIGAN) 0.2-0.5 % ophthalmic solution Apply 1 drop to eye 2 (two) times daily. Patient not taking: Reported on 04/16/2022 04/19/21   [provider]  budesonide (RHINOCORT AQUA) 32 MCG/ACT nasal spray 1-2 spray in  each nostril daily 04/16/22   Kozlow, Donnamarie Poag, MD  budesonide-formoterol (SYMBICORT) 80-4.5 MCG/ACT inhaler Inhale 2 puffs into the lungs 2 (two) times daily. 06/21/22   Kozlow, Donnamarie Poag, MD  cetirizine (ZYRTEC) 10 MG tablet Take 1 tablet (10 mg total) by mouth 2 (two) times daily. 04/16/22   Kozlow, Donnamarie Poag, MD  cromolyn (OPTICROM) 4 % ophthalmic solution Place 1 drop into both eyes 4 (four) times daily.  03/20/17   [provider]  cycloSPORINE (RESTASIS) 0.05 % ophthalmic emulsion Place 1 drop into both eyes 2 (two) times daily. Patient not taking: Reported on 04/16/2022    [provider]  cyproheptadine (PERIACTIN) 4 MG tablet Take 1 tablet (4 mg total) by mouth at bedtime. 04/16/22   Kozlow, Donnamarie Poag, MD  diclofenac (VOLTAREN) 0.1 % ophthalmic solution 1 drop 2 (two) times daily. 02/27/21   [provider]  diclofenac Sodium (VOLTAREN) 1 % GEL Apply 2 g topically 4 (four) times daily. 02/25/20   [provider]  Diethylpropion HCl CR 75 MG TB24 Take 1 tablet (75 mg total) by mouth daily. 01/05/20   Terald Sleeper, PA-C  dorzolamide-timolol (COSOPT) 22.3-6.8 MG/ML ophthalmic solution Place 1 drop into both eyes 2 (two) times daily. 03/26/22   [provider]  ELIQUIS 2.5 MG TABS tablet Take 2.5 mg by mouth 2 (two) times daily. 11/12/21   [provider]  Epinastine HCl 0.05 % ophthalmic solution Apply 1 drop to eye 2 (two) times daily. 02/20/21   [provider]  fluticasone Asencion Islam) 50 MCG/ACT nasal spray take 1-2 SPRAY in each nostril three times WEEKLY 07/12/22   Kozlow, Donnamarie Poag, MD  HYDROcodone-acetaminophen (NORCO) 7.5-325 MG tablet Take 1 tablet by mouth every 6 (six) hours as needed for moderate pain.    [provider]  linaclotide Rolan Lipa) 290 MCG CAPS capsule Take 1 capsule (290 mcg total) by mouth daily before breakfast. Patient not taking: Reported on 03/28/2022 12/03/19   Terald Sleeper, PA-C  losartan (COZAAR) 25 MG tablet Take 1  tablet (25 mg total) by mouth daily. Patient taking differently: Take 25 mg by mouth daily. Taking PRN per patient 07/05/19   Terald Sleeper, PA-C  lubiprostone (AMITIZA) 8 MCG capsule SMARTSIG:1 Capsule(s) By Mouth 1-4 Times Daily 02/26/22   [provider]  megestrol (MEGACE) 40 MG tablet TAKE 3 TABLETS DAILY BY MOUTH FOR 5 DAYS, 2 TABLETS DAILY FOR 5 DAYS, THEN 1 TABLET DAILY 07/27/20   Florian Buff, MD  mepolizumab (NUCALA) 100 MG injection Inject 100 mg into the skin every 28 (twenty-eight) days. 10/29/21   Valentina Shaggy, MD  metFORMIN (GLUCOPHAGE) 500 MG tablet TAKE 1 TABLET BY MOUTH EVERY DAY WITH BREAKFAST 07/05/19   Terald Sleeper, PA-C  montelukast (SINGULAIR) 10 MG tablet Take 1 tablet (10 mg total) by mouth at bedtime. 04/16/22   Kozlow, Donnamarie Poag, MD  Olopatadine HCl 0.6 % SOLN PLACE TWO SPRAYS INTO BOTH NOSTRILS TWICE DAILY 04/18/22   Kozlow, Donnamarie Poag, MD  pantoprazole (PROTONIX) 40 MG tablet Take 1 tablet (40 mg total) by mouth 2 (two) times daily. 04/16/22   Kozlow, Donnamarie Poag, MD  PATADAY 0.7 % SOLN Apply 1 drop to eye daily. 04/17/21   [provider]  pravastatin (PRAVACHOL) 40 MG tablet Take 1 tablet (40 mg total) by mouth daily. Patient taking differently: Take 40 mg by mouth 2 (two) times daily. 02/04/20   Dettinger, Fransisca Kaufmann, MD  progesterone (PROMETRIUM) 200 MG capsule Take 1 capsule (200 mg total) by mouth at bedtime. 03/28/22   Florian Buff, MD  Scar Treatment Products (CELACYN) GEL Apply 1 application topically every morning. 07/10/20   Clark-Burning, Anderson Malta, PA-C  spironolactone (ALDACTONE) 50 MG tablet TAKE 1 TABLET BY MOUTH TWICE DAILY 04/23/22   Lavonna Monarch, MD  UNABLE TO FIND Allergy shots-both arms-weekly    [provider]  VANIQA 13.9 % cream APPLY TO AFFECTED AREA TWICE DAILY 07/08/22   Lavonna Monarch, MD  XIIDRA 5 % SOLN Apply 1 drop to eye 2 (two) times daily. 03/27/22   [provider]      Allergies    Demerol [meperidine],  Morphine and related, Other, Cardizem  [diltiazem hcl], Diltiazem hcl, Gabapentin, Lisinopril, Lyrica [pregabalin], and Tramadol    Review of Systems   Review of Systems  All other systems reviewed and are negative.   Physical Exam Updated Vital Signs BP 118/80 (BP Location: Right Arm)   Pulse 77   Temp 98.2 F (36.8 C) (Oral)   Resp 15   Ht '5\' 2"'$  (1.575 m)   Wt 55.8 kg   SpO2 100%   BMI 22.50 kg/m  Physical Exam Vitals and nursing note reviewed.  Constitutional:      General: She is not in acute distress.    Appearance: Normal appearance.  HENT:     Head: Normocephalic and  atraumatic.  Eyes:     General:        Right eye: No discharge.        Left eye: No discharge.  Cardiovascular:     Comments: Regular rate and rhythm.  S1/S2 are distinct without any evidence of murmur, rubs, or gallops.  Radial pulses are 2+ bilaterally.  Dorsalis pedis pulses are 2+ bilaterally.  No evidence of pedal edema. Pulmonary:     Comments: Clear to auscultation bilaterally.  Normal effort.  No respiratory distress.  No evidence of wheezes, rales, or rhonchi heard throughout. Abdominal:     General: Abdomen is flat. Bowel sounds are normal. There is no distension.     Tenderness: There is no abdominal tenderness. There is no guarding or rebound.  Musculoskeletal:        General: Normal range of motion.     Cervical back: Neck supple.     Comments: Left lower extremity is not swollen.  It is mildly tender to palpation primarily in the left calf.  2+ dorsalis pedis pulse felt on the left.  Skin:    General: Skin is warm and dry.     Findings: No rash.  Neurological:     General: No focal deficit present.     Mental Status: She is alert.  Psychiatric:        Mood and Affect: Mood normal.        Behavior: Behavior normal.     ED Results / Procedures / Treatments   Labs (all labs ordered are listed, but only abnormal results are displayed) Labs Reviewed - No data to  display  EKG None  Radiology DG Hip Unilat W or Wo Pelvis 2-3 Views Left  Result Date: 07/17/2022 CLINICAL DATA:  51 year old female with left hip and calf pain for 3 days, radiating to the hip. On Eliquis for blood clots. EXAM: DG HIP (WITH OR WITHOUT PELVIS) 2-3V LEFT COMPARISON:  CT Abdomen and Pelvis 03/05/2021, 01/13/2019. FINDINGS: Bone mineralization is within normal limits. Femoral heads are normally located. Pelvis appears stable and intact. Symmetric SI joints. Grossly intact proximal right femur. Negative visible lower abdominal and pelvic visceral contours. Proximal left femur appears intact and within normal limits. Mild degenerative enthesophyte ptosis at the left inferior pubic ramus does not appear significantly changed from 2020. IMPRESSION: No acute osseous abnormality identified about the left hip or pelvis. Electronically Signed   By: Genevie Ann M.D.   On: 07/17/2022 13:54   US Venous Img Lower Unilateral Left (DVT)  Result Date: 07/17/2022 CLINICAL DATA:  Acute left lower extremity pain. EXAM: Left LOWER EXTREMITY VENOUS DOPPLER ULTRASOUND TECHNIQUE: Gray-scale sonography with compression, as well as color and duplex ultrasound, were performed to evaluate the deep venous system(s) from the level of the common femoral vein through the popliteal and proximal calf veins. COMPARISON:  None Available. FINDINGS: VENOUS Normal compressibility of the common femoral, superficial femoral, and popliteal veins, as well as the visualized calf veins. Visualized portions of profunda femoral vein and great saphenous vein unremarkable. No filling defects to suggest DVT on grayscale or color Doppler imaging. Doppler waveforms show normal direction of venous flow, normal respiratory plasticity and response to augmentation. Limited views of the contralateral common femoral vein are unremarkable. OTHER None. Limitations: none IMPRESSION: Negative. Electronically Signed   By: Marijo Conception M.D.   On:  07/17/2022 13:48    Procedures Procedures    Medications Ordered in ED Medications  ketorolac (TORADOL) 15 MG/ML injection  15 mg (15 mg Intramuscular Given 07/17/22 1352)    ED Course/ Medical Decision Making/ A&P Clinical Course as of 07/17/22 1409  Wed Jul 17, 2022  1407 US Venous Img Lower Unilateral Left (DVT) No evidence of blood clots on ultrasound.  I personally ordered and interpreted the study.  I do agree with the radiologist to rotation. [CF]  6213 DG Hip Unilat W or Wo Pelvis 2-3 Views Left No evidence of fracture or dislocation of the hip.  I personally ordered and interpreted the study.  I do agree with radiologist interpretation. [CF]    Clinical Course User Index [CF] Hendricks Limes, PA-C                           Medical Decision Making PEYSON DELAO is a 51 y.o. female patient who presents to the emergency department for further evaluation of left lower extremity pain.  Given her history of PE and DVT several years ago I am concerned for recurrence of her blood clot.  Patient has been taking her Eliquis and has not missed any doses however she is not at the threshold for VTE.  I will repeat an ultrasound to get a x-ray of the left hip for further evaluation.  No evidence of fracture or dislocation in the hip or DVT on ultrasound.  I spoke with the patient to more when going over her results and she does have bulging disks in her back and sometimes states that she does have this burning sensation down her leg.  It does go down from the top of the leg down to the bottom.  Suspect this might be related to sciatica.  She will follow-up with her primary care doctor for further evaluation.  She is safe for discharge at this time.  Amount and/or Complexity of Data Reviewed Radiology: ordered.  Risk Prescription drug management.   Final Clinical Impression(s) / ED Diagnoses Final diagnoses:  Pain of left lower extremity    Rx / DC Orders ED Discharge Orders      None         Hendricks Limes, PA-C 07/17/22 1409    Godfrey Pick, MD 07/20/22 [phone]

## 2022-07-18 ENCOUNTER — Ambulatory Visit (INDEPENDENT_AMBULATORY_CARE_PROVIDER_SITE_OTHER): Payer: 59

## 2022-07-18 DIAGNOSIS — M722 Plantar fascial fibromatosis: Secondary | ICD-10-CM

## 2022-07-18 NOTE — Progress Notes (Signed)
Patient presents today to pick up custom molded foot orthotics, diagnosed with plantar fasciitis by Dr. Milinda Pointer.   Orthotics were dispensed and fit was satisfactory. Reviewed instructions for break-in and wear. Written instructions given to patient.  Patient will follow up as needed.   [facility] - order # [id number]

## 2022-07-30 ENCOUNTER — Ambulatory Visit (INDEPENDENT_AMBULATORY_CARE_PROVIDER_SITE_OTHER): Payer: 59 | Admitting: *Deleted

## 2022-07-30 DIAGNOSIS — J309 Allergic rhinitis, unspecified: Secondary | ICD-10-CM

## 2022-08-08 ENCOUNTER — Ambulatory Visit: Payer: 59 | Admitting: Podiatry

## 2022-08-08 DIAGNOSIS — M778 Other enthesopathies, not elsewhere classified: Secondary | ICD-10-CM

## 2022-08-08 DIAGNOSIS — M722 Plantar fascial fibromatosis: Secondary | ICD-10-CM

## 2022-08-09 ENCOUNTER — Telehealth: Payer: Self-pay | Admitting: Podiatry

## 2022-08-09 ENCOUNTER — Ambulatory Visit: Payer: 59

## 2022-08-09 NOTE — Telephone Encounter (Signed)
Patient called she gave you the wrong paper to make the orthotics by, she would like you to call her so that she can verify that you have the correct information.

## 2022-08-09 NOTE — Telephone Encounter (Signed)
Patient called back and would like Tonika or Christan to give her a call back about her paperwork.

## 2022-08-10 NOTE — Progress Notes (Signed)
She presents today for follow-up of her [place]ter fasciitis bilaterally.  She states that she is a still sore.  States that she just picked up her new orthotics and she would like to have the last pair of orthotics made by the one she just picked up today.  She states that they feel good and she would like to have them duplicated.  She is also concerned about tenderness on palpation and range of motion of the first metatarsal phalangeal joint as well as tenderness with shoe gear.  Objective: Vital signs are stable she is alert and oriented x3.  Pulses are palpable.  She still has pain on palpation of the first metatarsal phalangeal joint of the right foot as well as tenderness on palpation of the bilateral heels.  The majority of her tenderness is noted at the plantar fascial calcaneal insertion site medially.  She does not have limited range of motion of the first metatarsophalangeal joint though there is crepitation and tenderness on palpation to the dorsal and dorsal medial aspect of the joint.  Assessment: Discussed etiology pathology conservative versus surgical therapies at this point I talked to [facility] about having the orthotics redone to a slim fit dress style.  And she states that she would do so.  I also injected Karlas first metatarsophalangeal joint with dexamethasone and local anesthetic.  I also injected the bilateral heels today 20 mg Kenalog 5 mg Marcaine point of maximal tenderness.  We will follow-up with her in the near future for reevaluation and to pick up her orthotics.

## 2022-08-12 ENCOUNTER — Telehealth: Payer: Self-pay | Admitting: *Deleted

## 2022-08-12 NOTE — Telephone Encounter (Signed)
PA has been submitted through CoverMyMeds for Pantoprazole and is currently pending approval/denial.

## 2022-08-14 DIAGNOSIS — H401131 Primary open-angle glaucoma, bilateral, mild stage: Secondary | ICD-10-CM | POA: Diagnosis not present

## 2022-08-14 DIAGNOSIS — H16223 Keratoconjunctivitis sicca, not specified as Sjogren's, bilateral: Secondary | ICD-10-CM | POA: Diagnosis not present

## 2022-08-14 DIAGNOSIS — Z7984 Long term (current) use of oral hypoglycemic drugs: Secondary | ICD-10-CM | POA: Diagnosis not present

## 2022-08-14 DIAGNOSIS — E119 Type 2 diabetes mellitus without complications: Secondary | ICD-10-CM | POA: Diagnosis not present

## 2022-08-15 MED ORDER — PANTOPRAZOLE SODIUM 40 MG PO TBEC: 40.0000 mg | DELAYED_RELEASE_TABLET | Freq: Two times a day (BID) | ORAL | 1 refills | Status: DC

## 2022-08-15 NOTE — Addendum Note (Signed)
Addended by: Tommas Olp B on: 08/15/2022 03:23 PM   Modules accepted: Orders

## 2022-08-15 NOTE — Telephone Encounter (Signed)
Pantoprazole 40 mg PA...  KEY: [id number] - PA Case ID: [id number] - Rx# [id number]  Approved on September 25 Your PA request has been approved 08/12/22 - 08/13/23.  Resending prescription to pharmacy. Patient notified via myChart.

## 2022-08-16 ENCOUNTER — Ambulatory Visit: Payer: 59

## 2022-08-16 DIAGNOSIS — J4550 Severe persistent asthma, uncomplicated: Secondary | ICD-10-CM

## 2022-08-23 ENCOUNTER — Ambulatory Visit (INDEPENDENT_AMBULATORY_CARE_PROVIDER_SITE_OTHER): Payer: 59

## 2022-08-23 DIAGNOSIS — J309 Allergic rhinitis, unspecified: Secondary | ICD-10-CM | POA: Diagnosis not present

## 2022-08-27 DIAGNOSIS — I10 Essential (primary) hypertension: Secondary | ICD-10-CM | POA: Diagnosis not present

## 2022-08-27 DIAGNOSIS — E119 Type 2 diabetes mellitus without complications: Secondary | ICD-10-CM | POA: Diagnosis not present

## 2022-08-30 ENCOUNTER — Ambulatory Visit (INDEPENDENT_AMBULATORY_CARE_PROVIDER_SITE_OTHER): Payer: 59

## 2022-08-30 DIAGNOSIS — J309 Allergic rhinitis, unspecified: Secondary | ICD-10-CM | POA: Diagnosis not present

## 2022-09-06 ENCOUNTER — Ambulatory Visit (INDEPENDENT_AMBULATORY_CARE_PROVIDER_SITE_OTHER): Payer: 59

## 2022-09-06 ENCOUNTER — Other Ambulatory Visit: Payer: Self-pay | Admitting: Allergy and Immunology

## 2022-09-06 DIAGNOSIS — J309 Allergic rhinitis, unspecified: Secondary | ICD-10-CM | POA: Diagnosis not present

## 2022-09-06 DIAGNOSIS — J4522 Mild intermittent asthma with status asthmaticus: Secondary | ICD-10-CM

## 2022-09-10 ENCOUNTER — Telehealth: Payer: Self-pay | Admitting: Podiatry

## 2022-09-10 NOTE — Telephone Encounter (Signed)
LVM for pt to call back for an appt for OPU APPT

## 2022-09-13 ENCOUNTER — Ambulatory Visit (INDEPENDENT_AMBULATORY_CARE_PROVIDER_SITE_OTHER): Payer: 59 | Admitting: Podiatry

## 2022-09-13 ENCOUNTER — Ambulatory Visit: Payer: 59

## 2022-09-13 ENCOUNTER — Ambulatory Visit (INDEPENDENT_AMBULATORY_CARE_PROVIDER_SITE_OTHER): Payer: 59

## 2022-09-13 DIAGNOSIS — M76821 Posterior tibial tendinitis, right leg: Secondary | ICD-10-CM

## 2022-09-13 DIAGNOSIS — M7730 Calcaneal spur, unspecified foot: Secondary | ICD-10-CM

## 2022-09-13 DIAGNOSIS — J4550 Severe persistent asthma, uncomplicated: Secondary | ICD-10-CM | POA: Diagnosis not present

## 2022-09-13 DIAGNOSIS — M722 Plantar fascial fibromatosis: Secondary | ICD-10-CM

## 2022-09-19 ENCOUNTER — Ambulatory Visit
Admission: EM | Admit: 2022-09-19 | Discharge: 2022-09-19 | Disposition: A | Discharge status: Home / Self Care | Payer: 59 | Attending: Nurse Practitioner | Admitting: Nurse Practitioner

## 2022-09-19 DIAGNOSIS — Z1152 Encounter for screening for COVID-19: Secondary | ICD-10-CM | POA: Diagnosis present

## 2022-09-19 DIAGNOSIS — J069 Acute upper respiratory infection, unspecified: Secondary | ICD-10-CM | POA: Insufficient documentation

## 2022-09-19 LAB — POCT RAPID STREP A (OFFICE): Rapid Strep A Screen: NEGATIVE

## 2022-09-19 MED ORDER — BENZONATATE 100 MG PO CAPS: 100.0000 mg | ORAL_CAPSULE | Freq: Three times a day (TID) | ORAL | 0 refills | Status: DC | PRN

## 2022-09-19 MED ORDER — ACETAMINOPHEN 500 MG PO TABS
1000.0000 mg | ORAL_TABLET | Freq: Once | ORAL | Status: AC
  Administered 2022-09-19: 1000 mg via ORAL

## 2022-09-19 NOTE — ED Triage Notes (Signed)
Pt reports sore throat, ear fullness, sinus pressure and headache x 2 days. Sore throat is worse when swallow.

## 2022-09-19 NOTE — ED Provider Notes (Signed)
[facility]    CSN: [id number] Arrival date & time: 09/19/22  1624      History   Chief Complaint Chief Complaint  Patient presents with   Sore Throat        Ear Fullness   Headache    HPI Jennifer Mullins is a 51 y.o. female.   Patient presents for 2 days of dry cough, nasal congestion, runny nose, postnasal drainage, sneezing, sore throat, sinus pressure and headache, bilateral ear pain/pressure with liquid "running" from her ears, fatigue, and reports her smell is "off".  She denies fever, shortness of breath, chest pain, chest congestion, abdominal pain, nausea/vomiting, diarrhea, decreased appetite, new rash.  No known sick contacts, although female family member in the room is reportedly also sneezing.  Patient has been using Listerine for the sore throat without much relief.  Medical history significant for extrinsic asthma, chronic sinusitis, allergic rhinoconjunctivitis.  She takes numerous medications for allergies and also receives shots for her allergies.    Past Medical History:  Diagnosis Date   Anxiety    Arm vein blood clot, left    Arthritis    Asthma    Bilateral calcaneal spurs    Bilateral polycystic ovarian syndrome    Bulging lumbar disc    Carpal tunnel syndrome, bilateral    COPD (chronic obstructive pulmonary disease) (HCC)    BRONCHITIS   Diabetes mellitus without complication (HCC)    TYPE 2   DX  4-5 YRS AGO   ETD (Eustachian tube dysfunction), bilateral    left worse than right   Fibromyalgia    GAD (generalized anxiety disorder)    GERD (gastroesophageal reflux disease)    TAKES PRESCRIPTION MEDS   Glaucoma    BOTH EYES   Hyperlipidemia    Hypertension    Hypothyroidism    NODULES ON THYROID   PONV (postoperative nausea and vomiting)    Pulmonary embolism (HCC)    Tachycardia    TMJ (dislocation of temporomandibular joint)    Trigger finger    Trigger finger of left thumb     Patient Active Problem List   Diagnosis  Date Noted   Chronic anxiety 05/10/2021   Chronic constipation 05/10/2021   Diabetes mellitus without complication ([place]) 09/38/1829   Displacement of lumbar intervertebral disc without myelopathy 05/10/2021   History of arthrodesis 05/10/2021   Uterine fibroid 05/10/2021   Bulging lumbar disc 02/16/2020   Primary insomnia 02/10/2020   Numbness of left hand 07/06/2019   Rectal bleeding 02/01/2019   Acne vulgaris 02/01/2019   Hirsutism 01/29/2019   Hypokalemia 01/13/2019   Acute blood loss anemia 01/13/2019   Symptomatic anemia    TMJ (dislocation of temporomandibular joint) 08/24/2018   Fibromyalgia 07/09/2018   Trigger thumb, right thumb 06/15/2018   Lipoma of lower extremity 06/15/2018   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   Dyspareunia, female 12/11/2017   Menometrorrhagia 12/11/2017   Pain in right ankle and joints of right foot 10/29/2017   Chronic pain of right knee 10/29/2017   Iron deficiency anemia due to chronic blood loss 07/07/2017   S/P cervical spinal fusion 02/27/2017   Dysfunctional uterine bleeding 11/24/2016   Anticoagulated 11/24/2016   Allergic rhinoconjunctivitis 07/28/2015   Chronic headache 07/28/2015   PCOS (polycystic ovarian syndrome) 06/20/2015   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   Hyperlipidemia 06/20/2015   GAD (generalized anxiety disorder) 05/18/2014   GERD (gastroesophageal reflux disease) 05/18/2014   Chronic sinusitis 07/02/2013   Extrinsic asthma 05/20/2013  HTN (hypertension) 05/20/2013    Past Surgical History:  Procedure Laterality Date   ANTERIOR CERVICAL DECOMP/DISCECTOMY FUSION N/A 11/13/2016   Procedure: Cervical five-six, Cervical six-seven Anterior Cervical Discectomy and Fusion, Allograft, Plate;  Surgeon: Marybelle Killings, MD;  Location: [facility];  Service: Orthopedics;  Laterality: N/A;   BREAST SURGERY     CARPAL TUNNEL RELEASE     DILATION AND CURETTAGE OF UTERUS     EXPLORATORY LAPAROTOMY     LIPOMA EXCISION Left  06/15/2018   Procedure: LEFT CALF LIPOMA EXCISION;  Surgeon: Marybelle Killings, MD;  Location: [facility];  Service: Orthopedics;  Laterality: Left;   NASAL SINUS SURGERY  07/22/2013   Dr. Redmond Pulling in [place]   plate and six screws in back     REDUCTION MAMMAPLASTY Bilateral 2000   TRIGGER FINGER RELEASE Right 06/15/2018   Procedure: RIGHT TRIGGER THUMB RELEASE;  Surgeon: Marybelle Killings, MD;  Location: [facility];  Service: Orthopedics;  Laterality: Right;    OB History     Gravida  1   Para  1   Term      Preterm  1   AB      Living  1      SAB      IAB      Ectopic      Multiple      Live Births  1            Home Medications    Prior to Admission medications   Medication Sig Start Date End Date Taking? Authorizing Provider  benzonatate (TESSALON) 100 MG capsule Take 1 capsule (100 mg total) by mouth 3 (three) times daily as needed for cough. Do not take with alcohol or while driving or operating heavy machinery.  May cause drowsiness. 09/19/22  Yes Noemi Chapel A, NP  albuterol (VENTOLIN HFA) 108 (90 Base) MCG/ACT inhaler INHALE TWO PUFFS BY MOUTH EVERY 6 HOURS AS NEEDED FOR WHEEZING OR SHORTNESS OF BREATH (lupin brand ONLY) 09/06/22   Kozlow, Donnamarie Poag, MD  ALPRAZolam (XANAX) 0.5 MG tablet TAKE 1 TABLET BY MOUTH TWICE DAILY AS NEEDED. MAY TAKE ONE MORE TABLET BEFORE FOR ALLERGY SHOTS ONCE A WEEK (30 DAY supply) 01/05/20   Terald Sleeper, PA-C  aspirin 81 MG chewable tablet Chew 81 mg by mouth daily. Take 2 tablets for a total of 162 mg daily    [provider]  AUVI-Q 0.3 MG/0.3ML SOAJ injection Use for life threatening allergic reactions 04/16/22   Kozlow, Donnamarie Poag, MD  azelastine (ASTELIN) 0.1 % nasal spray Place 2 sprays into both nostrils 2 (two) times daily. 04/16/22   Kozlow, Donnamarie Poag, MD  azelastine (OPTIVAR) 0.05 % ophthalmic solution Apply 1 drop to eye 2 (two) times daily. 02/27/21   [provider]  brimonidine-timolol  (COMBIGAN) 0.2-0.5 % ophthalmic solution Apply 1 drop to eye 2 (two) times daily. Patient not taking: Reported on 04/16/2022 04/19/21   [provider]  budesonide (RHINOCORT AQUA) 32 MCG/ACT nasal spray 1-2 spray in each nostril daily 04/16/22   Kozlow, Donnamarie Poag, MD  budesonide-formoterol (SYMBICORT) 80-4.5 MCG/ACT inhaler Inhale 2 puffs into the lungs 2 (two) times daily. 06/21/22   Kozlow, Donnamarie Poag, MD  cetirizine (ZYRTEC) 10 MG tablet Take 1 tablet (10 mg total) by mouth 2 (two) times daily. 04/16/22   Kozlow, Donnamarie Poag, MD  cromolyn (OPTICROM) 4 % ophthalmic solution Place 1 drop into both eyes 4 (four) times daily.  03/20/17   [provider]  cycloSPORINE (RESTASIS) 0.05 % ophthalmic emulsion Place 1 drop into both eyes 2 (two) times daily. Patient not taking: Reported on 04/16/2022    [provider]  cyproheptadine (PERIACTIN) 4 MG tablet Take 1 tablet (4 mg total) by mouth at bedtime. 04/16/22   Kozlow, Donnamarie Poag, MD  diclofenac (VOLTAREN) 0.1 % ophthalmic solution 1 drop 2 (two) times daily. 02/27/21   [provider]  diclofenac Sodium (VOLTAREN) 1 % GEL Apply 2 g topically 4 (four) times daily. 02/25/20   [provider]  Diethylpropion HCl CR 75 MG TB24 Take 1 tablet (75 mg total) by mouth daily. 01/05/20   Terald Sleeper, PA-C  dorzolamide-timolol (COSOPT) 22.3-6.8 MG/ML ophthalmic solution Place 1 drop into both eyes 2 (two) times daily. 03/26/22   [provider]  ELIQUIS 2.5 MG TABS tablet Take 2.5 mg by mouth 2 (two) times daily. 11/12/21   [provider]  Epinastine HCl 0.05 % ophthalmic solution Apply 1 drop to eye 2 (two) times daily. 02/20/21   [provider]  fluticasone Asencion Islam) 50 MCG/ACT nasal spray take 1-2 SPRAY in each nostril three times WEEKLY 07/12/22   Kozlow, Donnamarie Poag, MD  HYDROcodone-acetaminophen (NORCO) 7.5-325 MG tablet Take 1 tablet by mouth every 6 (six) hours as needed for moderate pain.    [provider]   linaclotide Rolan Lipa) 290 MCG CAPS capsule Take 1 capsule (290 mcg total) by mouth daily before breakfast. Patient not taking: Reported on 03/28/2022 12/03/19   Terald Sleeper, PA-C  losartan (COZAAR) 25 MG tablet Take 1 tablet (25 mg total) by mouth daily. Patient taking differently: Take 25 mg by mouth daily. Taking PRN per patient 07/05/19   Terald Sleeper, PA-C  lubiprostone (AMITIZA) 8 MCG capsule SMARTSIG:1 Capsule(s) By Mouth 1-4 Times Daily 02/26/22   [provider]  megestrol (MEGACE) 40 MG tablet TAKE 3 TABLETS DAILY BY MOUTH FOR 5 DAYS, 2 TABLETS DAILY FOR 5 DAYS, THEN 1 TABLET DAILY 07/27/20   Florian Buff, MD  mepolizumab (NUCALA) 100 MG injection Inject 100 mg into the skin every 28 (twenty-eight) days. 10/29/21   Valentina Shaggy, MD  metFORMIN (GLUCOPHAGE) 500 MG tablet TAKE 1 TABLET BY MOUTH EVERY DAY WITH BREAKFAST 07/05/19   Terald Sleeper, PA-C  montelukast (SINGULAIR) 10 MG tablet Take 1 tablet (10 mg total) by mouth at bedtime. 04/16/22   Kozlow, Donnamarie Poag, MD  Olopatadine HCl 0.6 % SOLN PLACE TWO SPRAYS INTO BOTH NOSTRILS TWICE DAILY 04/18/22   Kozlow, Donnamarie Poag, MD  pantoprazole (PROTONIX) 40 MG tablet Take 1 tablet (40 mg total) by mouth 2 (two) times daily. 08/15/22   Kozlow, Donnamarie Poag, MD  pravastatin (PRAVACHOL) 40 MG tablet Take 1 tablet (40 mg total) by mouth daily. Patient taking differently: Take 40 mg by mouth 2 (two) times daily. 02/04/20   Dettinger, Fransisca Kaufmann, MD  progesterone (PROMETRIUM) 200 MG capsule Take 1 capsule (200 mg total) by mouth at bedtime. 03/28/22   Florian Buff, MD  Scar Treatment Products (CELACYN) GEL Apply 1 application topically every morning. 07/10/20   Clark-Burning, Anderson Malta, PA-C  spironolactone (ALDACTONE) 50 MG tablet TAKE 1 TABLET BY MOUTH TWICE DAILY 04/23/22   Lavonna Monarch, MD  UNABLE TO FIND Allergy shots-both arms-weekly    [provider]  VANIQA 13.9 % cream APPLY TO AFFECTED AREA TWICE DAILY 07/08/22   Lavonna Monarch, MD   XIIDRA 5 % SOLN Apply 1  drop to eye 2 (two) times daily. 03/27/22   [provider]    Family History Family History  Problem Relation Age of Onset   Hypertension Mother    Hypertension Father    Cancer Son    Colon cancer Neg Hx    Rectal cancer Neg Hx    Stomach cancer Neg Hx    Esophageal cancer Neg Hx     Social History Social History   Tobacco Use   Smoking status: Former    Packs/day: 0.25    Years: 5.00    Total pack years: 1.25    Types: Cigarettes    Quit date: 11/18/1989    Years since quitting: 32.8    Passive exposure: Never   Smokeless tobacco: Never  Vaping Use   Vaping Use: Never used  Substance Use Topics   Alcohol use: No   Drug use: No     Allergies   Demerol [meperidine], Morphine and related, Other, Cardizem  [diltiazem hcl], Diltiazem hcl, Gabapentin, Lisinopril, Lyrica [pregabalin], and Tramadol   Review of Systems Review of Systems Per HPI  Physical Exam Triage Vital Signs ED Triage Vitals  Enc Vitals Group     BP 09/19/22 1630 120/79     Pulse Rate 09/19/22 1630 72     Resp 09/19/22 1630 16     Temp 09/19/22 1630 98.4 F (36.9 C)     Temp Source 09/19/22 1630 Oral     SpO2 09/19/22 1630 98 %     Weight --      Height --      Head Circumference --      Peak Flow --      Pain Score 09/19/22 1631 8     Pain Loc --      Pain Edu? --      Excl. in [place]? --    No data found.  Updated Vital Signs BP 120/79 (BP Location: Right Arm)   Pulse 72   Temp 98.4 F (36.9 C) (Oral)   Resp 16   SpO2 98%   Visual Acuity Right Eye Distance:   Left Eye Distance:   Bilateral Distance:    Right Eye Near:   Left Eye Near:    Bilateral Near:     Physical Exam Vitals and nursing note reviewed.  Constitutional:      General: She is not in acute distress.    Appearance: Normal appearance. She is not ill-appearing or toxic-appearing.  HENT:     Head: Normocephalic and atraumatic.     Right Ear: Tympanic membrane, ear canal and  external ear normal. No drainage, swelling or tenderness. No middle ear effusion. Tympanic membrane is not erythematous.     Left Ear: Tympanic membrane, ear canal and external ear normal. No drainage, swelling or tenderness.  No middle ear effusion. Tympanic membrane is not erythematous.     Nose: Congestion and rhinorrhea present.     Mouth/Throat:     Mouth: Mucous membranes are moist.     Pharynx: Oropharynx is clear. Posterior oropharyngeal erythema present. No oropharyngeal exudate.     Tonsils: No tonsillar exudate. 0 on the right. 0 on the left.     Comments: Significant cobblestoning of posterior pharynx Eyes:     General: No scleral icterus.    Extraocular Movements: Extraocular movements intact.  Cardiovascular:     Rate and Rhythm: Normal rate and regular rhythm.     Heart sounds: No murmur heard. Pulmonary:  Effort: Pulmonary effort is normal. No respiratory distress.     Breath sounds: Normal breath sounds. No wheezing, rhonchi or rales.  Abdominal:     General: Abdomen is flat. Bowel sounds are normal. There is no distension.     Palpations: Abdomen is soft.     Tenderness: There is no abdominal tenderness.  Musculoskeletal:     Cervical back: Normal range of motion and neck supple.  Lymphadenopathy:     Cervical: No cervical adenopathy.  Skin:    General: Skin is warm and dry.     Coloration: Skin is not jaundiced or pale.     Findings: No erythema or rash.  Neurological:     Mental Status: She is alert and oriented to person, place, and time.     Motor: No weakness.  Psychiatric:        Behavior: Behavior is cooperative.      UC Treatments / Results  Labs (all labs ordered are listed, but only abnormal results are displayed) Labs Reviewed  SARS CORONAVIRUS 2 (TAT 6-24 HRS)  POCT RAPID STREP A (OFFICE)    EKG   Radiology No results found.  Procedures Procedures (including critical care time)  Medications Ordered in UC Medications   acetaminophen (TYLENOL) tablet 1,000 mg (has no administration in time range)    Initial Impression / Assessment and Plan / UC Course  I have reviewed the triage vital signs and the nursing notes.  Pertinent labs & imaging results that were available during my care of the patient were reviewed by me and considered in my medical decision making (see chart for details).   Patient is well-appearing, normotensive, afebrile, not tachycardic, not tachypneic, oxygenating well on room air.    Encounter for screening for COVID-19 Viral URI with cough COVID-19 testing obtained; patient be a good candidate for molnupiravir if she tests positive Supportive care discussed Specifically recommended Mucinex, Coricidin products Start Tessalon Perles as needed for dry cough ER, return precautions discussed  The patient was given the opportunity to ask questions.  All questions answered to their satisfaction.  The patient is in agreement to this plan.    Final Clinical Impressions(s) / UC Diagnoses   Final diagnoses:  Encounter for screening for COVID-19  Viral URI with cough     Discharge Instructions      You have a viral upper respiratory infection.  Symptoms should improve over the next week to 10 days.  If you develop chest pain or shortness of breath, go to the emergency room.  We have tested you today for COVID-19.  The rapid strep throat test is negative today.  You will see the results in Mychart and we will call you with positive results.    Please stay home and isolate until you are aware of the results.    Some things that can make you feel better are: - Increased rest - Increasing fluid with water/sugar free electrolytes - Acetaminophen as needed for fever/pain - Salt water gargling, chloraseptic spray and throat lozenges - OTC guaifenesin (Mucinex) 600 mg twice daily - Saline sinus flushes or a neti pot - Humidifying the air -Tessalon Perles during the day as needed for dry  cough     ED Prescriptions     Medication Sig Dispense Auth. Provider   benzonatate (TESSALON) 100 MG capsule Take 1 capsule (100 mg total) by mouth 3 (three) times daily as needed for cough. Do not take with alcohol or while driving or operating heavy  machinery.  May cause drowsiness. 21 capsule Eulogio Bear, NP      PDMP not reviewed this encounter.   Eulogio Bear, NP 09/19/22 1702

## 2022-09-19 NOTE — Discharge Instructions (Addendum)
You have a viral upper respiratory infection.  Symptoms should improve over the next week to 10 days.  If you develop chest pain or shortness of breath, go to the emergency room.  We have tested you today for COVID-19.  The rapid strep throat test is negative today.  You will see the results in Mychart and we will call you with positive results.    Please stay home and isolate until you are aware of the results.    Some things that can make you feel better are: - Increased rest - Increasing fluid with water/sugar free electrolytes - Acetaminophen as needed for fever/pain - Salt water gargling, chloraseptic spray and throat lozenges - OTC guaifenesin (Mucinex) 600 mg twice daily - Saline sinus flushes or a neti pot - Humidifying the air -Tessalon Perles during the day as needed for dry cough

## 2022-09-20 LAB — SARS CORONAVIRUS 2 (TAT 6-24 HRS): SARS Coronavirus 2: NEGATIVE

## 2022-09-23 ENCOUNTER — Ambulatory Visit
Admission: EM | Admit: 2022-09-23 | Discharge: 2022-09-23 | Disposition: A | Discharge status: Home / Self Care | Payer: 59 | Attending: Nurse Practitioner | Admitting: Nurse Practitioner

## 2022-09-23 DIAGNOSIS — J3489 Other specified disorders of nose and nasal sinuses: Secondary | ICD-10-CM | POA: Diagnosis not present

## 2022-09-23 DIAGNOSIS — Z8709 Personal history of other diseases of the respiratory system: Secondary | ICD-10-CM | POA: Diagnosis not present

## 2022-09-23 DIAGNOSIS — R0981 Nasal congestion: Secondary | ICD-10-CM | POA: Diagnosis not present

## 2022-09-23 MED ORDER — AMOXICILLIN-POT CLAVULANATE 875-125 MG PO TABS: 1.0000 | ORAL_TABLET | Freq: Two times a day (BID) | ORAL | 0 refills | Status: AC

## 2022-09-23 NOTE — Discharge Instructions (Addendum)
The sinus symptoms you are having will likely improve over the next couple of days.  Continue all the medications and treatments you are doing.  If they are not improved by Wednesday, go ahead and start on the antibiotic (Augmentin).  For the nasal dryness, you can try vitamin A&D ointment applied inside her nose, Vaseline, or Aquaphor.

## 2022-09-23 NOTE — ED Triage Notes (Signed)
Came here last week with sore throat and nasal congestion and is still having facial pain and nasal congestion and coughing up yellow mucus and bloody nose. Been taking prescribe medications and is not working.

## 2022-09-23 NOTE — ED Provider Notes (Signed)
[facility]    CSN: [id number] Arrival date & time: 09/23/22  1625      History   Chief Complaint Chief Complaint  Patient presents with   Facial Pain   Nasal Congestion    HPI Jennifer Mullins is a 51 y.o. female.   Patient presents for 1 week of sinus pressure, headache, cough that is worse at night, nasal congestion, runny nose, postnasal drainage, sore throat, and fatigue.  She reports her symptoms have improved somewhat since she was seen last week.  She denies fever, shortness of breath or chest pain, chest congestion, sneezing, tooth pain, ear pain or drainage, abdominal pain, nausea/vomiting, diarrhea, decreased appetite, loss of taste or smell, and new rash.  She has tried saline rinses, humidifier, Tessalon Perles, Mucinex without relief.    Past Medical History:  Diagnosis Date   Anxiety    Arm vein blood clot, left    Arthritis    Asthma    Bilateral calcaneal spurs    Bilateral polycystic ovarian syndrome    Bulging lumbar disc    Carpal tunnel syndrome, bilateral    COPD (chronic obstructive pulmonary disease) (HCC)    BRONCHITIS   Diabetes mellitus without complication (HCC)    TYPE 2   DX  4-5 YRS AGO   ETD (Eustachian tube dysfunction), bilateral    left worse than right   Fibromyalgia    GAD (generalized anxiety disorder)    GERD (gastroesophageal reflux disease)    TAKES PRESCRIPTION MEDS   Glaucoma    BOTH EYES   Hyperlipidemia    Hypertension    Hypothyroidism    NODULES ON THYROID   PONV (postoperative nausea and vomiting)    Pulmonary embolism (HCC)    Tachycardia    TMJ (dislocation of temporomandibular joint)    Trigger finger    Trigger finger of left thumb     Patient Active Problem List   Diagnosis Date Noted   Chronic anxiety 05/10/2021   Chronic constipation 05/10/2021   Diabetes mellitus without complication ([place]) 62/95/2841   Displacement of lumbar intervertebral disc without myelopathy 05/10/2021   History of  arthrodesis 05/10/2021   Uterine fibroid 05/10/2021   Bulging lumbar disc 02/16/2020   Primary insomnia 02/10/2020   Numbness of left hand 07/06/2019   Rectal bleeding 02/01/2019   Acne vulgaris 02/01/2019   Hirsutism 01/29/2019   Hypokalemia 01/13/2019   Acute blood loss anemia 01/13/2019   Symptomatic anemia    TMJ (dislocation of temporomandibular joint) 08/24/2018   Fibromyalgia 07/09/2018   Trigger thumb, right thumb 06/15/2018   Lipoma of lower extremity 06/15/2018   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   Dyspareunia, female 12/11/2017   Menometrorrhagia 12/11/2017   Pain in right ankle and joints of right foot 10/29/2017   Chronic pain of right knee 10/29/2017   Iron deficiency anemia due to chronic blood loss 07/07/2017   S/P cervical spinal fusion 02/27/2017   Dysfunctional uterine bleeding 11/24/2016   Anticoagulated 11/24/2016   Allergic rhinoconjunctivitis 07/28/2015   Chronic headache 07/28/2015   PCOS (polycystic ovarian syndrome) 06/20/2015   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   Hyperlipidemia 06/20/2015   GAD (generalized anxiety disorder) 05/18/2014   GERD (gastroesophageal reflux disease) 05/18/2014   Chronic sinusitis 07/02/2013   Extrinsic asthma 05/20/2013   HTN (hypertension) 05/20/2013    Past Surgical History:  Procedure Laterality Date   ANTERIOR CERVICAL DECOMP/DISCECTOMY FUSION N/A 11/13/2016   Procedure: Cervical five-six, Cervical six-seven Anterior Cervical Discectomy and Fusion, Allograft,  Plate;  Surgeon: Marybelle Killings, MD;  Location: [place];  Service: Orthopedics;  Laterality: N/A;   BREAST SURGERY     CARPAL TUNNEL RELEASE     DILATION AND CURETTAGE OF UTERUS     EXPLORATORY LAPAROTOMY     LIPOMA EXCISION Left 06/15/2018   Procedure: LEFT CALF LIPOMA EXCISION;  Surgeon: Marybelle Killings, MD;  Location: [place];  Service: Orthopedics;  Laterality: Left;   NASAL SINUS SURGERY  07/22/2013   Dr. Redmond Pulling in [place]    plate and six screws in back     REDUCTION MAMMAPLASTY Bilateral 2000   TRIGGER FINGER RELEASE Right 06/15/2018   Procedure: RIGHT TRIGGER THUMB RELEASE;  Surgeon: Marybelle Killings, MD;  Location: [place];  Service: Orthopedics;  Laterality: Right;    OB History     Gravida  1   Para  1   Term      Preterm  1   AB      Living  1      SAB      IAB      Ectopic      Multiple      Live Births  1            Home Medications    Prior to Admission medications   Medication Sig Start Date End Date Taking? Authorizing Provider  amoxicillin-clavulanate (AUGMENTIN) 875-125 MG tablet Take 1 tablet by mouth 2 (two) times daily for 7 days. 09/23/22 09/30/22 Yes Noemi Chapel A, NP  albuterol (VENTOLIN HFA) 108 (90 Base) MCG/ACT inhaler INHALE TWO PUFFS BY MOUTH EVERY 6 HOURS AS NEEDED FOR WHEEZING OR SHORTNESS OF BREATH (lupin brand ONLY) 09/06/22   Kozlow, Donnamarie Poag, MD  ALPRAZolam (XANAX) 0.5 MG tablet TAKE 1 TABLET BY MOUTH TWICE DAILY AS NEEDED. MAY TAKE ONE MORE TABLET BEFORE FOR ALLERGY SHOTS ONCE A WEEK (30 DAY supply) 01/05/20   Terald Sleeper, PA-C  aspirin 81 MG chewable tablet Chew 81 mg by mouth daily. Take 2 tablets for a total of 162 mg daily    [provider]  AUVI-Q 0.3 MG/0.3ML SOAJ injection Use for life threatening allergic reactions 04/16/22   Kozlow, Donnamarie Poag, MD  azelastine (ASTELIN) 0.1 % nasal spray Place 2 sprays into both nostrils 2 (two) times daily. 04/16/22   Kozlow, Donnamarie Poag, MD  azelastine (OPTIVAR) 0.05 % ophthalmic solution Apply 1 drop to eye 2 (two) times daily. 02/27/21   [provider]  benzonatate (TESSALON) 100 MG capsule Take 1 capsule (100 mg total) by mouth 3 (three) times daily as needed for cough. Do not take with alcohol or while driving or operating heavy machinery.  May cause drowsiness. 09/19/22   Eulogio Bear, NP  brimonidine-timolol (COMBIGAN) 0.2-0.5 % ophthalmic solution Apply 1 drop to eye 2  (two) times daily. Patient not taking: Reported on 04/16/2022 04/19/21   [provider]  budesonide (RHINOCORT AQUA) 32 MCG/ACT nasal spray 1-2 spray in each nostril daily 04/16/22   Kozlow, Donnamarie Poag, MD  budesonide-formoterol (SYMBICORT) 80-4.5 MCG/ACT inhaler Inhale 2 puffs into the lungs 2 (two) times daily. 06/21/22   Kozlow, Donnamarie Poag, MD  cetirizine (ZYRTEC) 10 MG tablet Take 1 tablet (10 mg total) by mouth 2 (two) times daily. 04/16/22   Kozlow, Donnamarie Poag, MD  cromolyn (OPTICROM) 4 % ophthalmic solution Place 1 drop into both eyes 4 (four) times daily.  03/20/17   [provider]  cycloSPORINE (  RESTASIS) 0.05 % ophthalmic emulsion Place 1 drop into both eyes 2 (two) times daily. Patient not taking: Reported on 04/16/2022    [provider]  cyproheptadine (PERIACTIN) 4 MG tablet Take 1 tablet (4 mg total) by mouth at bedtime. 04/16/22   Kozlow, Donnamarie Poag, MD  diclofenac (VOLTAREN) 0.1 % ophthalmic solution 1 drop 2 (two) times daily. 02/27/21   [provider]  diclofenac Sodium (VOLTAREN) 1 % GEL Apply 2 g topically 4 (four) times daily. 02/25/20   [provider]  Diethylpropion HCl CR 75 MG TB24 Take 1 tablet (75 mg total) by mouth daily. 01/05/20   Terald Sleeper, PA-C  dorzolamide-timolol (COSOPT) 22.3-6.8 MG/ML ophthalmic solution Place 1 drop into both eyes 2 (two) times daily. 03/26/22   [provider]  ELIQUIS 2.5 MG TABS tablet Take 2.5 mg by mouth 2 (two) times daily. 11/12/21   [provider]  Epinastine HCl 0.05 % ophthalmic solution Apply 1 drop to eye 2 (two) times daily. 02/20/21   [provider]  fluticasone Asencion Islam) 50 MCG/ACT nasal spray take 1-2 SPRAY in each nostril three times WEEKLY 07/12/22   Kozlow, Donnamarie Poag, MD  HYDROcodone-acetaminophen (NORCO) 7.5-325 MG tablet Take 1 tablet by mouth every 6 (six) hours as needed for moderate pain.    [provider]  linaclotide Rolan Lipa) 290 MCG CAPS capsule Take 1 capsule (290  mcg total) by mouth daily before breakfast. Patient not taking: Reported on 03/28/2022 12/03/19   Terald Sleeper, PA-C  losartan (COZAAR) 25 MG tablet Take 1 tablet (25 mg total) by mouth daily. Patient taking differently: Take 25 mg by mouth daily. Taking PRN per patient 07/05/19   Terald Sleeper, PA-C  lubiprostone (AMITIZA) 8 MCG capsule SMARTSIG:1 Capsule(s) By Mouth 1-4 Times Daily 02/26/22   [provider]  megestrol (MEGACE) 40 MG tablet TAKE 3 TABLETS DAILY BY MOUTH FOR 5 DAYS, 2 TABLETS DAILY FOR 5 DAYS, THEN 1 TABLET DAILY 07/27/20   Florian Buff, MD  mepolizumab (NUCALA) 100 MG injection Inject 100 mg into the skin every 28 (twenty-eight) days. 10/29/21   Valentina Shaggy, MD  metFORMIN (GLUCOPHAGE) 500 MG tablet TAKE 1 TABLET BY MOUTH EVERY DAY WITH BREAKFAST 07/05/19   Terald Sleeper, PA-C  montelukast (SINGULAIR) 10 MG tablet Take 1 tablet (10 mg total) by mouth at bedtime. 04/16/22   Kozlow, Donnamarie Poag, MD  Olopatadine HCl 0.6 % SOLN PLACE TWO SPRAYS INTO BOTH NOSTRILS TWICE DAILY 04/18/22   Kozlow, Donnamarie Poag, MD  pantoprazole (PROTONIX) 40 MG tablet Take 1 tablet (40 mg total) by mouth 2 (two) times daily. 08/15/22   Kozlow, Donnamarie Poag, MD  pravastatin (PRAVACHOL) 40 MG tablet Take 1 tablet (40 mg total) by mouth daily. Patient taking differently: Take 40 mg by mouth 2 (two) times daily. 02/04/20   Dettinger, Fransisca Kaufmann, MD  progesterone (PROMETRIUM) 200 MG capsule Take 1 capsule (200 mg total) by mouth at bedtime. 03/28/22   Florian Buff, MD  Scar Treatment Products (CELACYN) GEL Apply 1 application topically every morning. 07/10/20   Clark-Burning, Anderson Malta, PA-C  spironolactone (ALDACTONE) 50 MG tablet TAKE 1 TABLET BY MOUTH TWICE DAILY 04/23/22   Lavonna Monarch, MD  UNABLE TO FIND Allergy shots-both arms-weekly    [provider]  VANIQA 13.9 % cream APPLY TO AFFECTED AREA TWICE DAILY 07/08/22   Lavonna Monarch, MD  XIIDRA 5 % SOLN Apply 1 drop to eye 2 (two) times daily. 03/27/22  [provider]    Family History Family History  Problem Relation Age of Onset   Hypertension Mother    Hypertension Father    Cancer Son    Colon cancer Neg Hx    Rectal cancer Neg Hx    Stomach cancer Neg Hx    Esophageal cancer Neg Hx     Social History Social History   Tobacco Use   Smoking status: Former    Packs/day: 0.25    Years: 5.00    Total pack years: 1.25    Types: Cigarettes    Quit date: 11/18/1989    Years since quitting: 32.8    Passive exposure: Never   Smokeless tobacco: Never  Vaping Use   Vaping Use: Never used  Substance Use Topics   Alcohol use: No   Drug use: No     Allergies   Demerol [meperidine], Morphine and related, Other, Cardizem  [diltiazem hcl], Diltiazem hcl, Gabapentin, Lisinopril, Lyrica [pregabalin], and Tramadol   Review of Systems Review of Systems Per HPI  Physical Exam Triage Vital Signs ED Triage Vitals [09/23/22 1641]  Enc Vitals Group     BP 112/62     Pulse Rate 81     Resp 18     Temp 98 F (36.7 C)     Temp Source Oral     SpO2 98 %     Weight      Height      Head Circumference      Peak Flow      Pain Score 9     Pain Loc      Pain Edu?      Excl. in [place]?    No data found.  Updated Vital Signs BP 112/62 (BP Location: Right Arm)   Pulse 81   Temp 98 F (36.7 C) (Oral)   Resp 18   SpO2 98%   Visual Acuity Right Eye Distance:   Left Eye Distance:   Bilateral Distance:    Right Eye Near:   Left Eye Near:    Bilateral Near:     Physical Exam Vitals and nursing note reviewed.  Constitutional:      General: She is not in acute distress.    Appearance: Normal appearance. She is not ill-appearing or toxic-appearing.  HENT:     Head: Normocephalic and atraumatic.     Right Ear: Tympanic membrane, ear canal and external ear normal.     Left Ear: Tympanic membrane, ear canal and external ear normal.     Nose: Rhinorrhea present. No congestion.     Right Sinus: Maxillary sinus  tenderness and frontal sinus tenderness present.     Left Sinus: Maxillary sinus tenderness and frontal sinus tenderness present.     Comments: Dried blood noted to the nasal turbinates bilaterally    Mouth/Throat:     Mouth: Mucous membranes are moist.     Pharynx: Oropharynx is clear. Posterior oropharyngeal erythema present. No oropharyngeal exudate.  Eyes:     General: No scleral icterus.    Extraocular Movements: Extraocular movements intact.  Cardiovascular:     Rate and Rhythm: Normal rate and regular rhythm.  Pulmonary:     Effort: Pulmonary effort is normal. No respiratory distress.     Breath sounds: Normal breath sounds. No wheezing, rhonchi or rales.  Abdominal:     General: Abdomen is flat. Bowel sounds are normal. There is no distension.     Palpations: Abdomen is soft.  Tenderness: There is no abdominal tenderness. There is no guarding.  Musculoskeletal:     Cervical back: Normal range of motion and neck supple.  Lymphadenopathy:     Cervical: No cervical adenopathy.  Skin:    General: Skin is warm and dry.     Capillary Refill: Capillary refill takes less than 2 seconds.     Coloration: Skin is not jaundiced or pale.     Findings: No erythema or rash.  Neurological:     Mental Status: She is alert and oriented to person, place, and time.  Psychiatric:        Behavior: Behavior is cooperative.      UC Treatments / Results  Labs (all labs ordered are listed, but only abnormal results are displayed) Labs Reviewed - No data to display  EKG   Radiology No results found.  Procedures Procedures (including critical care time)  Medications Ordered in UC Medications - No data to display  Initial Impression / Assessment and Plan / UC Course  I have reviewed the triage vital signs and the nursing notes.  Pertinent labs & imaging results that were available during my care of the patient were reviewed by me and considered in my medical decision making (see  chart for details).   Patient is well-appearing, normotensive, afebrile, not tachycardic, not tachypneic, oxygenating well on room air.    Frontal sinus pain Nasal congestion Again discussed with patient that I suspect viral etiology COVID-19 test was negative last week Recommended continue supportive care for the next 48 hours; with no improvement, can start Augmentin Recommended use of vitamin A&E ointment, Vaseline, or Aquaphor for nasal dryness Patient specifically requested Bactroban, I do not believe she needs this and will not be prescribing today  History of chronic rhinitis Continue chronic medications, follow-up with allergy and asthma  History of asthma , Continue chronic medications, follow-up with allergy and asthma  The patient was given the opportunity to ask questions.  All questions answered to their satisfaction.  The patient is in agreement to this plan.   Final Clinical Impressions(s) / UC Diagnoses   Final diagnoses:  Frontal sinus pain  Nasal congestion  History of chronic rhinitis  History of asthma     Discharge Instructions      The sinus symptoms you are having will likely improve over the next couple of days.  Continue all the medications and treatments you are doing.  If they are not improved by Wednesday, go ahead and start on the antibiotic (Augmentin).  For the nasal dryness, you can try vitamin A&D ointment applied inside her nose, Vaseline, or Aquaphor.     ED Prescriptions     Medication Sig Dispense Auth. Provider   amoxicillin-clavulanate (AUGMENTIN) 875-125 MG tablet Take 1 tablet by mouth 2 (two) times daily for 7 days. 14 tablet Eulogio Bear, NP      PDMP not reviewed this encounter.   Eulogio Bear, NP 09/23/22 [phone]

## 2022-09-24 ENCOUNTER — Other Ambulatory Visit: Payer: Self-pay | Admitting: Allergy and Immunology

## 2022-09-24 ENCOUNTER — Telehealth: Payer: Self-pay | Admitting: Allergy and Immunology

## 2022-09-24 NOTE — Telephone Encounter (Signed)
Patient called and said her nose was very dry and had blood in it . And needs to get a rx for Mupirocin called into [facility]. [phone]

## 2022-09-25 MED ORDER — MUPIROCIN 2 % EX OINT: TOPICAL_OINTMENT | CUTANEOUS | 0 refills | Status: DC

## 2022-09-25 NOTE — Telephone Encounter (Signed)
Prescription sent into the pharmacy. Patient stated that it was delivered to her house already. Unsure as to when it was sent before because there wasn't anything noted in this thread.

## 2022-09-30 ENCOUNTER — Other Ambulatory Visit: Payer: Self-pay | Admitting: Allergy and Immunology

## 2022-10-01 ENCOUNTER — Telehealth: Payer: Self-pay | Admitting: Allergy and Immunology

## 2022-10-01 ENCOUNTER — Ambulatory Visit: Payer: 59 | Admitting: Allergy and Immunology

## 2022-10-01 DIAGNOSIS — J4522 Mild intermittent asthma with status asthmaticus: Secondary | ICD-10-CM

## 2022-10-01 DIAGNOSIS — H1010 Acute atopic conjunctivitis, unspecified eye: Secondary | ICD-10-CM

## 2022-10-01 DIAGNOSIS — H1044 Vernal conjunctivitis: Secondary | ICD-10-CM

## 2022-10-01 DIAGNOSIS — H1013 Acute atopic conjunctivitis, bilateral: Secondary | ICD-10-CM

## 2022-10-01 DIAGNOSIS — K219 Gastro-esophageal reflux disease without esophagitis: Secondary | ICD-10-CM

## 2022-10-01 DIAGNOSIS — J3089 Other allergic rhinitis: Secondary | ICD-10-CM | POA: Diagnosis not present

## 2022-10-01 DIAGNOSIS — J301 Allergic rhinitis due to pollen: Secondary | ICD-10-CM

## 2022-10-01 DIAGNOSIS — J4550 Severe persistent asthma, uncomplicated: Secondary | ICD-10-CM | POA: Diagnosis not present

## 2022-10-01 DIAGNOSIS — G4720 Circadian rhythm sleep disorder, unspecified type: Secondary | ICD-10-CM

## 2022-10-01 DIAGNOSIS — G500 Trigeminal neuralgia: Secondary | ICD-10-CM

## 2022-10-01 MED ORDER — CETIRIZINE HCL 10 MG PO TABS: 10.0000 mg | ORAL_TABLET | Freq: Two times a day (BID) | ORAL | 1 refills | Status: DC

## 2022-10-01 MED ORDER — MONTELUKAST SODIUM 10 MG PO TABS: 10.0000 mg | ORAL_TABLET | Freq: Every evening | ORAL | 1 refills | Status: DC

## 2022-10-01 MED ORDER — AUVI-Q 0.3 MG/0.3ML IJ SOAJ: INTRAMUSCULAR | 3 refills | Status: DC

## 2022-10-01 MED ORDER — ALBUTEROL SULFATE HFA 108 (90 BASE) MCG/ACT IN AERS: 2.0000 | INHALATION_SPRAY | Freq: Four times a day (QID) | RESPIRATORY_TRACT | 1 refills | Status: DC | PRN

## 2022-10-01 MED ORDER — CYPROHEPTADINE HCL 4 MG PO TABS: 4.0000 mg | ORAL_TABLET | Freq: Every evening | ORAL | 5 refills | Status: DC

## 2022-10-01 MED ORDER — AZELASTINE HCL 0.1 % NA SOLN: 2.0000 | Freq: Two times a day (BID) | NASAL | 1 refills | Status: DC

## 2022-10-01 MED ORDER — BUDESONIDE-FORMOTEROL FUMARATE 80-4.5 MCG/ACT IN AERO
2.0000 | INHALATION_SPRAY | Freq: Two times a day (BID) | RESPIRATORY_TRACT | 5 refills | Status: DC
Start: 2022-10-01 — End: 2023-04-01

## 2022-10-01 MED ORDER — BUDESONIDE 32 MCG/ACT NA SUSP
2.0000 | Freq: Every day | NASAL | 1 refills | Status: DC
Start: 2022-10-01 — End: 2023-01-21

## 2022-10-01 MED ORDER — PANTOPRAZOLE SODIUM 40 MG PO TBEC: 40.0000 mg | DELAYED_RELEASE_TABLET | Freq: Two times a day (BID) | ORAL | 1 refills | Status: DC

## 2022-10-01 NOTE — Patient Instructions (Addendum)
  1. Continue Immunotherapy at maintenance 0.25 ml Red vial .     2. Continue to Treat and prevent inflammation:   A. OTC Budesonide/ Rhinocort - 1-2 sprays each nostril daily  B. Symbicort 80 - 2 inhalations 1-2 times per day  C. montelukast 10 mg - one tablet one time per day  D. Mepolizumab injections every 4 weeks  3. Continue to Treat and prevent reflux:   A.  Pantoprazole 40 mg twice a day  4. Treat and prevent facial pain syndrome:   A. Periactin 4 mg - 1/2 - 1 tablet at bedtime  5. If needed:   A. Azelastine 2 sprays each nostril two times per day  B. OTC antihistamine - Zyrtec '10mg'$  1-2 times per day.   C. Proventil HFA or similar 2 inhalations every 4-6 hours  D. Epi-Pen  E. Eye treatment directed by eye doctor  6. Use a extra dose of Zyrtec 1 hour before immunotherapy injection  7. Return to clinic in 6 months or earlier if problem  8. Letter for disability  9.  Obtain flu vaccine

## 2022-10-01 NOTE — Telephone Encounter (Signed)
Jennifer Mullins has an appointment today at 3:20 with Dr. Neldon Mc. She has missed her last couple of allergy injections and wants to know if she can get her injection today. But, she is also schedule to get her Covid Booster tomorrow somewhere else. She said she doesn't want to miss that because it will be next year before she can get it due to the long waiting list. She would like someone to call her back before 12:00, because she needs to know if she needs to take her Zyrtec today.

## 2022-10-01 NOTE — Progress Notes (Unsigned)
[facility]   Follow-up Note  Referring Provider: Hector Brunswick* Primary Provider: Adaline Sill, NP Date of Office Visit: 10/01/2022  Subjective:   Jennifer Mullins (DOB: September 18, 1971) is a 51 y.o. female who returns to the [facility] on 10/01/2022 in re-evaluation of the following:  HPI: Nat[facility] this clinic in evaluation of asthma, allergic rhinoconjunctivitis, vernal conjunctivitis, recurrent sinusitis, facial pain syndrome, dry eye syndrome, and LPR.  I last saw her in this clinic on 16 Apr 2022.  Her asthma has been under excellent control mostly using Symbicort twice a day.  Has not required systemic steroid to treat an exacerbation.  She rarely uses any short acting bronchodilator.  Her nose is doing pretty well although she did have an episode of epistaxis for which she cannot see Telephone last week and we gave her topical Bactroban and her epistaxis has resolved.  She does continue to use budesonide nasal spray along most days.  She did discontinue her budesonide nasal spray while she had epistaxis.  She continues on immunotherapy currently at every 3 weeks at a dose of 0.25 mL red vial which has been her maximal extract dose that is obtainable without side effects.  She continues to use therapy for reflux with proton pump inhibitor twice a day which is working quite well.  She continues on Periactin at 2 mg but sometimes 4 mg at night for her facial pain syndrome and sleep dysfunction which is working quite well.  She has not really been having any problems with her eyes.   Allergies as of 10/01/2022       Reactions   Demerol [meperidine] Shortness Of Breath   FLUSHING AND SHORTNESS OF BREATH   Morphine And Related Shortness Of Breath   Flushed and hot hyper Flushed and hot hyper Flushed and hot hyper Flushed and hot hyper   Other Shortness Of Breath   Flushed and hot hyper Flushed and hot  hyper Flushed and hot hyper Flushed and hot hyper Flushed and hot hyper   Cardizem  [diltiazem Hcl] Other (See Comments)   Diltiazem Hcl Hives   Gabapentin Other (See Comments)   Chest pain and elevated heart rate   Lisinopril    Caused cough   Lyrica [pregabalin]    Chest pain, elevated heart rate   Tramadol Other (See Comments)   Tears up stomach        Medication List    albuterol 108 (90 Base) MCG/ACT inhaler Commonly known as: VENTOLIN HFA INHALE TWO PUFFS BY MOUTH EVERY 6 HOURS AS NEEDED FOR WHEEZING OR SHORTNESS OF BREATH (lupin brand ONLY)   ALPRAZolam 0.5 MG tablet Commonly known as: XANAX TAKE 1 TABLET BY MOUTH TWICE DAILY AS NEEDED. MAY TAKE ONE MORE TABLET BEFORE FOR ALLERGY SHOTS ONCE A WEEK (30 DAY supply)   aspirin 81 MG chewable tablet Chew 81 mg by mouth daily. Take 2 tablets for a total of 162 mg daily   Auvi-Q 0.3 mg/0.3 mL Soaj injection Generic drug: EPINEPHrine Use for life threatening allergic reactions   azelastine 0.05 % ophthalmic solution Commonly known as: OPTIVAR Apply 1 drop to eye 2 (two) times daily.   azelastine 0.1 % nasal spray Commonly known as: ASTELIN Place 2 sprays into both nostrils 2 (two) times daily.   benzonatate 100 MG capsule Commonly known as: TESSALON Take 1 capsule (100 mg total) by mouth 3 (three) times daily as needed for  cough. Do not take with alcohol or while driving or operating heavy machinery.  May cause drowsiness.   brimonidine-timolol 0.2-0.5 % ophthalmic solution Commonly known as: COMBIGAN Apply 1 drop to eye 2 (two) times daily.   budesonide 32 MCG/ACT nasal spray Commonly known as: RHINOCORT AQUA 1-2 spray in each nostril daily   budesonide-formoterol 80-4.5 MCG/ACT inhaler Commonly known as: SYMBICORT Inhale 2 puffs into the lungs 2 (two) times daily.   Celacyn Gel Apply 1 application topically every morning.   cetirizine 10 MG tablet Commonly known as: ZYRTEC TAKE 1 TABLET BY MOUTH TWICE  DAILY   cromolyn 4 % ophthalmic solution Commonly known as: OPTICROM Place 1 drop into both eyes 4 (four) times daily.   cycloSPORINE 0.05 % ophthalmic emulsion Commonly known as: RESTASIS Place 1 drop into both eyes 2 (two) times daily.   cyproheptadine 4 MG tablet Commonly known as: PERIACTIN Take 1 tablet (4 mg total) by mouth at bedtime.   diclofenac 0.1 % ophthalmic solution Commonly known as: VOLTAREN 1 drop 2 (two) times daily.   diclofenac Sodium 1 % Gel Commonly known as: VOLTAREN Apply 2 g topically 4 (four) times daily.   Diethylpropion HCl CR 75 MG Tb24 Take 1 tablet (75 mg total) by mouth daily.   dorzolamide-timolol 2-0.5 % ophthalmic solution Commonly known as: COSOPT Place 1 drop into both eyes 2 (two) times daily.   Eliquis 2.5 MG Tabs tablet Generic drug: apixaban Take 2.5 mg by mouth 2 (two) times daily.   Epinastine HCl 0.05 % ophthalmic solution Apply 1 drop to eye 2 (two) times daily.   fluticasone 50 MCG/ACT nasal spray Commonly known as: FLONASE take 1-2 SPRAY in each nostril three times WEEKLY   HYDROcodone-acetaminophen 7.5-325 MG tablet Commonly known as: NORCO Take 1 tablet by mouth every 6 (six) hours as needed for moderate pain.   linaclotide 290 MCG Caps capsule Commonly known as: Linzess Take 1 capsule (290 mcg total) by mouth daily before breakfast.   losartan 25 MG tablet Commonly known as: COZAAR Take 1 tablet (25 mg total) by mouth daily. What changed: additional instructions   lubiprostone 8 MCG capsule Commonly known as: AMITIZA SMARTSIG:1 Capsule(s) By Mouth 1-4 Times Daily   megestrol 40 MG tablet Commonly known as: MEGACE TAKE 3 TABLETS DAILY BY MOUTH FOR 5 DAYS, 2 TABLETS DAILY FOR 5 DAYS, THEN 1 TABLET DAILY   metFORMIN 500 MG tablet Commonly known as: GLUCOPHAGE TAKE 1 TABLET BY MOUTH EVERY DAY WITH BREAKFAST   montelukast 10 MG tablet Commonly known as: SINGULAIR Take 1 tablet (10 mg total) by mouth at  bedtime.   mupirocin ointment 2 % Commonly known as: BACTROBAN APPLY TO THE AFFECTED AREA(S) IN NOSE THREE TIMES DAILY FOR THE NEXT 10 DAYS   Nucala 100 MG injection Generic drug: mepolizumab Inject 100 mg into the skin every 28 (twenty-eight) days.   Olopatadine HCl 0.6 % Soln PLACE TWO SPRAYS INTO BOTH NOSTRILS TWICE DAILY   pantoprazole 40 MG tablet Commonly known as: PROTONIX Take 1 tablet (40 mg total) by mouth 2 (two) times daily.   pravastatin 40 MG tablet Commonly known as: PRAVACHOL Take 1 tablet (40 mg total) by mouth daily. What changed: when to take this   progesterone 200 MG capsule Commonly known as: Prometrium Take 1 capsule (200 mg total) by mouth at bedtime.   spironolactone 50 MG tablet Commonly known as: ALDACTONE TAKE 1 TABLET BY MOUTH TWICE DAILY   UNABLE TO FIND Allergy shots-both  arms-weekly   Vaniqa 13.9 % cream Generic drug: Eflornithine HCl APPLY TO AFFECTED AREA TWICE DAILY   Xiidra 5 % Soln Generic drug: Lifitegrast Apply 1 drop to eye 2 (two) times daily.        Past Medical History:  Diagnosis Date   Anxiety    Arm vein blood clot, left    Arthritis    Asthma    Bilateral calcaneal spurs    Bilateral polycystic ovarian syndrome    Bulging lumbar disc    Carpal tunnel syndrome, bilateral    COPD (chronic obstructive pulmonary disease) (HCC)    BRONCHITIS   Diabetes mellitus without complication (HCC)    TYPE 2   DX  4-5 YRS AGO   ETD (Eustachian tube dysfunction), bilateral    left worse than right   Fibromyalgia    GAD (generalized anxiety disorder)    GERD (gastroesophageal reflux disease)    TAKES PRESCRIPTION MEDS   Glaucoma    BOTH EYES   Hyperlipidemia    Hypertension    Hypothyroidism    NODULES ON THYROID   PONV (postoperative nausea and vomiting)    Pulmonary embolism (HCC)    Tachycardia    TMJ (dislocation of temporomandibular joint)    Trigger finger    Trigger finger of left thumb     Past  Surgical History:  Procedure Laterality Date   ANTERIOR CERVICAL DECOMP/DISCECTOMY FUSION N/A 11/13/2016   Procedure: Cervical five-six, Cervical six-seven Anterior Cervical Discectomy and Fusion, Allograft, Plate;  Surgeon: Marybelle Killings, MD;  Location: [facility];  Service: Orthopedics;  Laterality: N/A;   BREAST SURGERY     CARPAL TUNNEL RELEASE     DILATION AND CURETTAGE OF UTERUS     EXPLORATORY LAPAROTOMY     LIPOMA EXCISION Left 06/15/2018   Procedure: LEFT CALF LIPOMA EXCISION;  Surgeon: Marybelle Killings, MD;  Location: [facility];  Service: Orthopedics;  Laterality: Left;   NASAL SINUS SURGERY  07/22/2013   Dr. Redmond Pulling in [place]   plate and six screws in back     REDUCTION MAMMAPLASTY Bilateral 2000   TRIGGER FINGER RELEASE Right 06/15/2018   Procedure: RIGHT TRIGGER THUMB RELEASE;  Surgeon: Marybelle Killings, MD;  Location: [facility];  Service: Orthopedics;  Laterality: Right;    Review of systems negative except as noted in HPI / PMHx or noted below:  Review of Systems  Constitutional: Negative.   HENT: Negative.    Eyes: Negative.   Respiratory: Negative.    Cardiovascular: Negative.   Gastrointestinal: Negative.   Genitourinary: Negative.   Musculoskeletal: Negative.   Skin: Negative.   Neurological: Negative.   Endo/Heme/Allergies: Negative.   Psychiatric/Behavioral: Negative.       Objective:   Vitals:   10/01/22 1513  BP: 110/80  Pulse: 62  Resp: 16  Temp: 98.3 F (36.8 C)  SpO2: 100%   Height: '5\' 2"'$  (157.5 cm)  Weight: 125 lb 3.2 oz (56.8 kg)   Physical Exam Constitutional:      Appearance: She is not diaphoretic.  HENT:     Head: Normocephalic.     Right Ear: Tympanic membrane, ear canal and external ear normal.     Left Ear: Tympanic membrane, ear canal and external ear normal.     Nose: Nose normal. No mucosal edema or rhinorrhea.     Mouth/Throat:     Pharynx: Uvula midline. No oropharyngeal exudate.  Eyes:      Conjunctiva/sclera: Conjunctivae normal.  Neck:     Thyroid: No thyromegaly.     Trachea: Trachea normal. No tracheal tenderness or tracheal deviation.  Cardiovascular:     Rate and Rhythm: Normal rate and regular rhythm.     Heart sounds: Normal heart sounds, S1 normal and S2 normal. No murmur heard. Pulmonary:     Effort: No respiratory distress.     Breath sounds: Normal breath sounds. No stridor. No wheezing or rales.  Lymphadenopathy:     Head:     Right side of head: No tonsillar adenopathy.     Left side of head: No tonsillar adenopathy.     Cervical: No cervical adenopathy.  Skin:    Findings: No erythema or rash.     Nails: There is no clubbing.  Neurological:     Mental Status: She is alert.     Diagnostics:    Spirometry was performed and demonstrated an FEV1 of 1.61 at 78 % of predicted.  Assessment and Plan:   1. Asthma, severe persistent, well-controlled   2. Perennial allergic rhinitis   3. Seasonal allergic rhinitis due to pollen   4. Seasonal allergic conjunctivitis   5. Vernal conjunctivitis of both eyes   6. Facial pain syndrome   7. 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 LPRD (laryngopharyngeal reflux disease)     1. Continue Immunotherapy at maintenance 0.25 ml Red vial .     2. Continue to Treat and prevent inflammation:   A. OTC Budesonide/ Rhinocort - 1-2 sprays each nostril daily  B. Symbicort 80 - 2 inhalations 1-2 times per day  C. montelukast 10 mg - one tablet one time per day  D. Mepolizumab injections every 4 weeks  3. Continue to Treat and prevent reflux:   A.  Pantoprazole 40 mg twice a day  4. Treat and prevent facial pain syndrome:   A. Periactin 4 mg - 1/2 - 1 tablet at bedtime  5. If needed:   A. Azelastine 2 sprays each nostril two times per day  B. OTC antihistamine - Zyrtec '10mg'$  1-2 times per day.   C. Proventil HFA or similar 2 inhalations every 4-6 hours  D. Epi-Pen  E. Eye treatment directed by eye  doctor  6. Use a extra dose of Zyrtec 1 hour before immunotherapy injection  7. Return to clinic in 6 months or earlier if problem  8. Letter for disability  9.  Obtain flu vaccine  Clotilda appears to be doing pretty well with her respiratory tract and her facial pain syndrome and her sleep dysfunction while utilizing a combination of therapy directed against respiratory tract inflammation including immunotherapy and mepolizumab and therapy directed against reflux and the use of cyproheptadine at bedtime.  She will continue to use the therapy noted above and I will see her back in this clinic in 6 months or earlier if there is a problem.  We will be composing a letter for her disability.  Allena Katz, MD Allergy / Immunology [place]

## 2022-10-02 ENCOUNTER — Telehealth: Payer: Self-pay | Admitting: Podiatry

## 2022-10-02 ENCOUNTER — Encounter: Payer: Self-pay | Admitting: Allergy and Immunology

## 2022-10-02 NOTE — Telephone Encounter (Signed)
Left message on voicemail for patient to call back her refurbished orthotics are in.

## 2022-10-04 NOTE — Addendum Note (Signed)
Addended by: Felipa Emory on: 10/04/2022 04:09 PM   Modules accepted: Orders

## 2022-10-09 ENCOUNTER — Encounter: Payer: Self-pay | Admitting: Allergy and Immunology

## 2022-10-09 NOTE — Progress Notes (Signed)
letter

## 2022-10-14 ENCOUNTER — Ambulatory Visit: Payer: 59

## 2022-10-14 ENCOUNTER — Other Ambulatory Visit: Payer: 59

## 2022-10-15 ENCOUNTER — Telehealth: Payer: Self-pay

## 2022-10-15 ENCOUNTER — Ambulatory Visit: Payer: 59 | Admitting: *Deleted

## 2022-10-15 ENCOUNTER — Other Ambulatory Visit: Payer: Self-pay | Admitting: *Deleted

## 2022-10-15 ENCOUNTER — Ambulatory Visit (INDEPENDENT_AMBULATORY_CARE_PROVIDER_SITE_OTHER): Payer: 59 | Admitting: *Deleted

## 2022-10-15 DIAGNOSIS — J4550 Severe persistent asthma, uncomplicated: Secondary | ICD-10-CM

## 2022-10-15 DIAGNOSIS — M722 Plantar fascial fibromatosis: Secondary | ICD-10-CM

## 2022-10-15 MED ORDER — CYPROHEPTADINE HCL 4 MG PO TABS: ORAL_TABLET | ORAL | 5 refills | Status: DC

## 2022-10-15 NOTE — Telephone Encounter (Signed)
Patient picked up letters today.

## 2022-10-15 NOTE — Telephone Encounter (Signed)
Patient Plans to pick up letters at the [facility] office. I printed both the November 2023 and June 2023 letters from Dr. Neldon Mc per patient request. Letter have been placed in patient pick up slot on the immunotherapy office side.

## 2022-10-17 ENCOUNTER — Ambulatory Visit: Payer: 59 | Admitting: Podiatry

## 2022-10-17 ENCOUNTER — Other Ambulatory Visit: Payer: Self-pay | Admitting: Obstetrics & Gynecology

## 2022-10-17 DIAGNOSIS — Z1231 Encounter for screening mammogram for malignant neoplasm of breast: Secondary | ICD-10-CM

## 2022-10-18 NOTE — Progress Notes (Signed)
Patient presents today to pick up custom molded foot orthotics recommended by Dr. Milinda Pointer.   Orthotics were  not dispensed. When sent back to the lab for adjustment a layer of 3MM PPT was added. Spoke with lab and they are going to remake it to be just like her other pair.    [facility] - order # [id number]

## 2022-10-21 ENCOUNTER — Ambulatory Visit (INDEPENDENT_AMBULATORY_CARE_PROVIDER_SITE_OTHER): Payer: 59

## 2022-10-21 DIAGNOSIS — J309 Allergic rhinitis, unspecified: Secondary | ICD-10-CM | POA: Diagnosis not present

## 2022-10-24 ENCOUNTER — Ambulatory Visit (INDEPENDENT_AMBULATORY_CARE_PROVIDER_SITE_OTHER): Payer: 59

## 2022-10-24 ENCOUNTER — Encounter: Payer: Self-pay | Admitting: Podiatry

## 2022-10-24 ENCOUNTER — Ambulatory Visit: Payer: 59 | Admitting: Podiatry

## 2022-10-24 DIAGNOSIS — M778 Other enthesopathies, not elsewhere classified: Secondary | ICD-10-CM | POA: Diagnosis not present

## 2022-10-24 DIAGNOSIS — M722 Plantar fascial fibromatosis: Secondary | ICD-10-CM

## 2022-10-27 NOTE — Progress Notes (Signed)
She presents today for follow-up of her Planter fasciitis type symptoms.  States that the plantar right heel demonstrate some sharp shooting pain sometimes but yet it feels numb.  She is also goes on to say that she has this bump that she feels may have come from having her toenails done and having to wear flip-flops at home while wearing the interdigital spacers to keep her nails from rubbing while wet.  Objective: Vital signs are stable she is alert and oriented x 3.  Minimal reproducible pain on palpation of the right heel.  She has the ability to splay her toes bilaterally and symmetrical.  Left foot does demonstrate just proximal to the metatarsal phalangeal joint a small firm nodule measuring 0.8 cm in diameter mildly fluctuant.  This is most consistent with a bursa.  Radiographs taken today demonstrate only soft tissue increase in density overlying the area in question.  No bony mass identified.  Assessment: Ganglion cyst we will keep an eye on this left foot.  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  She will follow-up to pick up her newly covered orthotics.

## 2022-11-13 ENCOUNTER — Ambulatory Visit (INDEPENDENT_AMBULATORY_CARE_PROVIDER_SITE_OTHER): Payer: 59

## 2022-11-13 DIAGNOSIS — J309 Allergic rhinitis, unspecified: Secondary | ICD-10-CM | POA: Diagnosis not present

## 2022-11-15 ENCOUNTER — Ambulatory Visit (INDEPENDENT_AMBULATORY_CARE_PROVIDER_SITE_OTHER): Payer: 59 | Admitting: *Deleted

## 2022-11-15 DIAGNOSIS — J4550 Severe persistent asthma, uncomplicated: Secondary | ICD-10-CM | POA: Diagnosis not present

## 2022-11-21 ENCOUNTER — Ambulatory Visit: Payer: 59

## 2022-11-29 ENCOUNTER — Telehealth: Payer: Self-pay | Admitting: Podiatry

## 2022-11-29 NOTE — Telephone Encounter (Signed)
Lmom to call back to pick up refurbished orthotics

## 2022-12-05 ENCOUNTER — Ambulatory Visit (INDEPENDENT_AMBULATORY_CARE_PROVIDER_SITE_OTHER): Payer: 59

## 2022-12-05 DIAGNOSIS — J309 Allergic rhinitis, unspecified: Secondary | ICD-10-CM | POA: Diagnosis not present

## 2022-12-11 ENCOUNTER — Ambulatory Visit: Payer: 59

## 2022-12-11 ENCOUNTER — Ambulatory Visit
Admission: RE | Admit: 2022-12-11 | Discharge: 2022-12-11 | Disposition: A | Discharge status: Home / Self Care | Payer: 59 | Source: Ambulatory Visit | Attending: Obstetrics & Gynecology | Admitting: Obstetrics & Gynecology

## 2022-12-11 DIAGNOSIS — Z1231 Encounter for screening mammogram for malignant neoplasm of breast: Secondary | ICD-10-CM

## 2022-12-13 ENCOUNTER — Ambulatory Visit: Payer: 59

## 2022-12-15 NOTE — Progress Notes (Signed)
Patient presents today to pick up custom molded foot orthotics recommended by Dr. Milinda Pointer.   Orthotics were still incorrect. Spoke with Ronalee Belts at the lab and we are sending back the correct pair as well as the pair to be corrected.

## 2022-12-16 ENCOUNTER — Ambulatory Visit: Payer: 59

## 2022-12-16 ENCOUNTER — Ambulatory Visit (INDEPENDENT_AMBULATORY_CARE_PROVIDER_SITE_OTHER): Payer: 59

## 2022-12-16 DIAGNOSIS — J4550 Severe persistent asthma, uncomplicated: Secondary | ICD-10-CM

## 2022-12-16 DIAGNOSIS — M722 Plantar fascial fibromatosis: Secondary | ICD-10-CM

## 2022-12-25 ENCOUNTER — Other Ambulatory Visit: Payer: Self-pay | Admitting: Allergy and Immunology

## 2022-12-25 DIAGNOSIS — J4522 Mild intermittent asthma with status asthmaticus: Secondary | ICD-10-CM

## 2022-12-27 ENCOUNTER — Other Ambulatory Visit: Payer: 59

## 2022-12-27 ENCOUNTER — Ambulatory Visit (INDEPENDENT_AMBULATORY_CARE_PROVIDER_SITE_OTHER): Payer: 59

## 2022-12-27 DIAGNOSIS — J309 Allergic rhinitis, unspecified: Secondary | ICD-10-CM | POA: Diagnosis not present

## 2023-01-01 ENCOUNTER — Ambulatory Visit (INDEPENDENT_AMBULATORY_CARE_PROVIDER_SITE_OTHER): Payer: 59

## 2023-01-01 DIAGNOSIS — M722 Plantar fascial fibromatosis: Secondary | ICD-10-CM

## 2023-01-06 NOTE — Progress Notes (Signed)
Patient presents today to pick up custom molded foot orthotics recommended by Dr. Milinda Pointer.   Orthotics were dispensed and fit was satisfactory. Reviewed instructions for break-in and wear. Written instructions given to patient.  Patient will follow up as needed.

## 2023-01-09 ENCOUNTER — Ambulatory Visit: Payer: 59

## 2023-01-13 ENCOUNTER — Ambulatory Visit: Payer: 59

## 2023-01-14 ENCOUNTER — Telehealth: Payer: Self-pay | Admitting: Allergy and Immunology

## 2023-01-14 MED ORDER — AZELASTINE HCL 0.1 % NA SOLN: 2.0000 | Freq: Two times a day (BID) | NASAL | 4 refills | Status: DC

## 2023-01-14 NOTE — Telephone Encounter (Signed)
Refill has been sent to requested pharmacy and this pharmacy has been starred for future use.

## 2023-01-14 NOTE — Telephone Encounter (Signed)
Jennifer Mullins would like refills of Astelin nose spray sent to [facility] in [place]  on [address]. Jennifer Mullins would like for this [facility] to be her preferred pharmacy from this point forward.

## 2023-01-16 ENCOUNTER — Ambulatory Visit (INDEPENDENT_AMBULATORY_CARE_PROVIDER_SITE_OTHER): Payer: 59

## 2023-01-16 ENCOUNTER — Telehealth: Payer: Self-pay | Admitting: Obstetrics & Gynecology

## 2023-01-16 DIAGNOSIS — J4550 Severe persistent asthma, uncomplicated: Secondary | ICD-10-CM

## 2023-01-16 NOTE — Telephone Encounter (Signed)
Patient wants to see about getting an ultrasound scheduled in April before her physical in May to check on fibroids. Please advise.

## 2023-01-21 ENCOUNTER — Other Ambulatory Visit: Payer: Self-pay | Admitting: Allergy and Immunology

## 2023-01-29 ENCOUNTER — Ambulatory Visit (INDEPENDENT_AMBULATORY_CARE_PROVIDER_SITE_OTHER): Payer: 59

## 2023-01-29 DIAGNOSIS — J309 Allergic rhinitis, unspecified: Secondary | ICD-10-CM | POA: Diagnosis not present

## 2023-02-04 ENCOUNTER — Ambulatory Visit: Payer: 59 | Admitting: Podiatry

## 2023-02-13 ENCOUNTER — Ambulatory Visit (INDEPENDENT_AMBULATORY_CARE_PROVIDER_SITE_OTHER): Payer: 59

## 2023-02-13 DIAGNOSIS — J4550 Severe persistent asthma, uncomplicated: Secondary | ICD-10-CM

## 2023-02-20 ENCOUNTER — Other Ambulatory Visit: Payer: Self-pay | Admitting: Allergy and Immunology

## 2023-02-27 ENCOUNTER — Other Ambulatory Visit: Payer: Self-pay | Admitting: Allergy and Immunology

## 2023-03-06 ENCOUNTER — Ambulatory Visit: Payer: 59 | Admitting: Podiatry

## 2023-03-07 ENCOUNTER — Ambulatory Visit (INDEPENDENT_AMBULATORY_CARE_PROVIDER_SITE_OTHER): Payer: 59

## 2023-03-07 DIAGNOSIS — J309 Allergic rhinitis, unspecified: Secondary | ICD-10-CM

## 2023-03-14 ENCOUNTER — Telehealth: Payer: Self-pay

## 2023-03-14 ENCOUNTER — Ambulatory Visit (INDEPENDENT_AMBULATORY_CARE_PROVIDER_SITE_OTHER): Payer: 59

## 2023-03-14 DIAGNOSIS — J4550 Severe persistent asthma, uncomplicated: Secondary | ICD-10-CM | POA: Diagnosis not present

## 2023-03-14 NOTE — Telephone Encounter (Signed)
Patient came in for her Nucala injection and informed me that she is experiencing some cold symptoms. She has reached out to her PCP to see what they can do for her. She sounds congested and wanted me to make a note of it. Patient will call if she doesn't hear back from PCP.

## 2023-03-14 NOTE — Progress Notes (Signed)
Patient informed me that she is experiencing some cold symptoms. She has reached out to her PCP to see what they can do for her. She sounds congested and wanted me to make a note of it. Patient will call if she doesn't hear back from PCP.

## 2023-03-16 ENCOUNTER — Encounter: Payer: Self-pay | Admitting: Emergency Medicine

## 2023-03-16 ENCOUNTER — Ambulatory Visit
Admission: EM | Admit: 2023-03-16 | Discharge: 2023-03-16 | Disposition: A | Discharge status: Home / Self Care | Payer: 59 | Attending: Nurse Practitioner | Admitting: Nurse Practitioner

## 2023-03-16 DIAGNOSIS — J0100 Acute maxillary sinusitis, unspecified: Secondary | ICD-10-CM

## 2023-03-16 DIAGNOSIS — Z1152 Encounter for screening for COVID-19: Secondary | ICD-10-CM | POA: Diagnosis present

## 2023-03-16 LAB — POCT INFLUENZA A/B
Influenza A, POC: NEGATIVE
Influenza B, POC: NEGATIVE

## 2023-03-16 NOTE — Discharge Instructions (Addendum)
The influenza test was negative, COVID test is pending.  You will be contacted if your COVID test is positive. Take medication as prescribed.  Continue your current allergy medications and antibiotic previously prescribed by your PCP. May take over-the-counter Tylenol or ibuprofen as needed for pain, fever, or general discomfort. Warm salt water gargles 3-4 times daily as needed for throat pain or discomfort. Recommend normal saline nasal spray throughout the day to help with nasal congestion and runny nose.  Recommend using a humidifier in your bedroom at nighttime during sleep and sleeping elevated on pillows while cough symptoms persist. As discussed Do not improve after completing the antibiotic, please follow-up in this clinic or with your primary care physician for further evaluation. Follow-up as needed.

## 2023-03-16 NOTE — ED Provider Notes (Signed)
[facility]    CSN: [id number] Arrival date & time: 03/16/23  1439      History   Chief Complaint No chief complaint on file.   HPI Jennifer Mullins is a 52 y.o. female.   The history is provided by the patient.   The patient presents for complaints of headache, nasal congestion, sore throat, bilateral ear pressure, facial pain, and cough.  Symptoms started over the past 48 hours.  Patient states that she has not had fever, chills, ear drainage, shortness of breath, difficulty breathing, chest pain, abdominal pain, nausea, vomiting, or diarrhea.  Patient reports that she did reach out to her primary care physician's office, and they prescribed an antibiotic for her.  She states she has taken 3 doses of the medication.  Patient states that she the medication was amoxicillin.  Patient also reports history of allergic rhinitis for which she receives allergy injections, she also takes budesonide nasal spray, Zyrtec, and azelastine.  Patient states that she does have a history of asthma.  She normally uses Symbicort.  She denies any recent asthma exacerbations.  Patient states she took a home COVID test which was negative, but reports she would like a "second opinion".  Past Medical History:  Diagnosis Date   Anxiety    Arm vein blood clot, left    Arthritis    Asthma    Bilateral calcaneal spurs    Bilateral polycystic ovarian syndrome    Bulging lumbar disc    Carpal tunnel syndrome, bilateral    COPD (chronic obstructive pulmonary disease) (HCC)    BRONCHITIS   Diabetes mellitus without complication (HCC)    TYPE 2   DX  4-5 YRS AGO   ETD (Eustachian tube dysfunction), bilateral    left worse than right   Fibromyalgia    GAD (generalized anxiety disorder)    GERD (gastroesophageal reflux disease)    TAKES PRESCRIPTION MEDS   Glaucoma    BOTH EYES   Hyperlipidemia    Hypertension    Hypothyroidism    NODULES ON THYROID   PONV (postoperative nausea and vomiting)     Pulmonary embolism (HCC)    Tachycardia    TMJ (dislocation of temporomandibular joint)    Trigger finger    Trigger finger of left thumb     Patient Active Problem List   Diagnosis Date Noted   Chronic anxiety 05/10/2021   Chronic constipation 05/10/2021   Diabetes mellitus without complication (HCC) 05/10/2021   Displacement of lumbar intervertebral disc without myelopathy 05/10/2021   History of arthrodesis 05/10/2021   Uterine fibroid 05/10/2021   Bulging lumbar disc 02/16/2020   Primary insomnia 02/10/2020   Numbness of left hand 07/06/2019   Rectal bleeding 02/01/2019   Acne vulgaris 02/01/2019   Hirsutism 01/29/2019   Hypokalemia 01/13/2019   Acute blood loss anemia 01/13/2019   Symptomatic anemia    TMJ (dislocation of temporomandibular joint) 08/24/2018   Fibromyalgia 07/09/2018   Trigger thumb, right thumb 06/15/2018   Lipoma of lower extremity 06/15/2018   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   Dyspareunia, female 12/11/2017   Menometrorrhagia 12/11/2017   Pain in right ankle and joints of right foot 10/29/2017   Chronic pain of right knee 10/29/2017   Iron deficiency anemia due to chronic blood loss 07/07/2017   S/P cervical spinal fusion 02/27/2017   Dysfunctional uterine bleeding 11/24/2016   Anticoagulated 11/24/2016   Allergic rhinoconjunctivitis 07/28/2015   Chronic headache 07/28/2015   PCOS (polycystic ovarian syndrome)  06/20/2015   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   Hyperlipidemia 06/20/2015   GAD (generalized anxiety disorder) 05/18/2014   GERD (gastroesophageal reflux disease) 05/18/2014   Chronic sinusitis 07/02/2013   Extrinsic asthma 05/20/2013   HTN (hypertension) 05/20/2013    Past Surgical History:  Procedure Laterality Date   ANTERIOR CERVICAL DECOMP/DISCECTOMY FUSION N/A 11/13/2016   Procedure: Cervical five-six, Cervical six-seven Anterior Cervical Discectomy and Fusion, Allograft, Plate;  Surgeon: Eldred Manges, MD;   Location: [facility];  Service: Orthopedics;  Laterality: N/A;   BREAST SURGERY     CARPAL TUNNEL RELEASE     DILATION AND CURETTAGE OF UTERUS     EXPLORATORY LAPAROTOMY     LIPOMA EXCISION Left 06/15/2018   Procedure: LEFT CALF LIPOMA EXCISION;  Surgeon: Eldred Manges, MD;  Location: [facility];  Service: Orthopedics;  Laterality: Left;   NASAL SINUS SURGERY  07/22/2013   Dr. Andrey Campanile in [place]   plate and six screws in back     REDUCTION MAMMAPLASTY Bilateral 2000   TRIGGER FINGER RELEASE Right 06/15/2018   Procedure: RIGHT TRIGGER THUMB RELEASE;  Surgeon: Eldred Manges, MD;  Location: [facility];  Service: Orthopedics;  Laterality: Right;    OB History     Gravida  1   Para  1   Term      Preterm  1   AB      Living  1      SAB      IAB      Ectopic      Multiple      Live Births  1            Home Medications    Prior to Admission medications   Medication Sig Start Date End Date Taking? Authorizing Provider  albuterol (VENTOLIN HFA) 108 (90 Base) MCG/ACT inhaler inhale 2 puffs BY MOUTH EVERY 6 HOURS AS NEEDED FOR wheezing/shortness of breath 12/25/22   Kozlow, Alvira Philips, MD  ALPRAZolam (XANAX) 0.5 MG tablet TAKE 1 TABLET BY MOUTH TWICE DAILY AS NEEDED. MAY TAKE ONE MORE TABLET BEFORE FOR ALLERGY SHOTS ONCE A WEEK (30 DAY supply) 01/05/20   Remus Loffler, PA-C  aspirin 81 MG chewable tablet Chew 81 mg by mouth daily. Take 2 tablets for a total of 162 mg daily    [provider]  AUVI-Q 0.3 MG/0.3ML SOAJ injection Use for life threatening allergic reactions 10/01/22   Kozlow, Alvira Philips, MD  azelastine (ASTELIN) 0.1 % nasal spray Place 2 sprays into both nostrils 2 (two) times daily. 01/14/23   Kozlow, Alvira Philips, MD  azelastine (OPTIVAR) 0.05 % ophthalmic solution Apply 1 drop to eye 2 (two) times daily. 02/27/21   [provider]  brimonidine-timolol (COMBIGAN) 0.2-0.5 % ophthalmic solution Apply 1 drop to eye 2 (two) times daily.  04/19/21   [provider]  budesonide ([facility] BUDESONIDE) 32 MCG/ACT nasal spray USE 1-2 SPRAYS INTO BOTH NOSTRILS EVERY DAY 02/20/23   Kozlow, Alvira Philips, MD  budesonide-formoterol (SYMBICORT) 80-4.5 MCG/ACT inhaler Inhale 2 puffs into the lungs 2 (two) times daily. 10/01/22   Kozlow, Alvira Philips, MD  cetirizine (ZYRTEC) 10 MG tablet Take 1 tablet (10 mg total) by mouth 2 (two) times daily. 10/01/22   Kozlow, Alvira Philips, MD  cromolyn (OPTICROM) 4 % ophthalmic solution Place 1 drop into both eyes 4 (four) times daily.  03/20/17   [provider]  cycloSPORINE (RESTASIS) 0.05 % ophthalmic emulsion Place 1 drop  into both eyes 2 (two) times daily.    [provider]  cyproheptadine (PERIACTIN) 4 MG tablet Take 1/2 to 1 tablet at bedtime. 10/15/22   Kozlow, Alvira Philips, MD  diclofenac (VOLTAREN) 0.1 % ophthalmic solution 1 drop 2 (two) times daily. 02/27/21   [provider]  diclofenac Sodium (VOLTAREN) 1 % GEL Apply 2 g topically 4 (four) times daily. 02/25/20   [provider]  Diethylpropion HCl CR 75 MG TB24 Take 1 tablet (75 mg total) by mouth daily. 01/05/20   Remus Loffler, PA-C  dorzolamide-timolol (COSOPT) 22.3-6.8 MG/ML ophthalmic solution Place 1 drop into both eyes 2 (two) times daily. 03/26/22   [provider]  ELIQUIS 2.5 MG TABS tablet Take 2.5 mg by mouth 2 (two) times daily. 11/12/21   [provider]  Epinastine HCl 0.05 % ophthalmic solution Apply 1 drop to eye 2 (two) times daily. 02/20/21   [provider]  fluticasone Aleda Grana) 50 MCG/ACT nasal spray take 1-2 SPRAY in each nostril three times WEEKLY 07/12/22   Kozlow, Alvira Philips, MD  HYDROcodone-acetaminophen (NORCO) 7.5-325 MG tablet Take 1 tablet by mouth every 6 (six) hours as needed for moderate pain.    [provider]  losartan (COZAAR) 25 MG tablet Take 1 tablet (25 mg total) by mouth daily. Patient taking differently: Take 25 mg by mouth daily. Taking PRN per patient 07/05/19    Remus Loffler, PA-C  lubiprostone (AMITIZA) 8 MCG capsule SMARTSIG:1 Capsule(s) By Mouth 1-4 Times Daily 02/26/22   [provider]  megestrol (MEGACE) 40 MG tablet TAKE 3 TABLETS DAILY BY MOUTH FOR 5 DAYS, 2 TABLETS DAILY FOR 5 DAYS, THEN 1 TABLET DAILY 07/27/20   Lazaro Arms, MD  mepolizumab (NUCALA) 100 MG injection Inject 100 mg into the skin every 28 (twenty-eight) days. 10/29/21   Alfonse Spruce, MD  metFORMIN (GLUCOPHAGE) 500 MG tablet TAKE 1 TABLET BY MOUTH EVERY DAY WITH BREAKFAST 07/05/19   Remus Loffler, PA-C  montelukast (SINGULAIR) 10 MG tablet TAKE 1 TABLET BY MOUTH AT BEDTIME 02/28/23   Kozlow, Alvira Philips, MD  pantoprazole (PROTONIX) 40 MG tablet Take 1 tablet (40 mg total) by mouth 2 (two) times daily. 10/01/22   Kozlow, Alvira Philips, MD  pravastatin (PRAVACHOL) 40 MG tablet Take 1 tablet (40 mg total) by mouth daily. Patient taking differently: Take 40 mg by mouth 2 (two) times daily. 02/04/20   Dettinger, Elige Radon, MD  progesterone (PROMETRIUM) 200 MG capsule Take 1 capsule (200 mg total) by mouth at bedtime. 03/28/22   Lazaro Arms, MD  spironolactone (ALDACTONE) 50 MG tablet TAKE 1 TABLET BY MOUTH TWICE DAILY 04/23/22   Janalyn Harder, MD  UNABLE TO FIND Allergy shots-both arms-weekly    [provider]  VANIQA 13.9 % cream APPLY TO AFFECTED AREA TWICE DAILY 07/08/22   Janalyn Harder, MD  XIIDRA 5 % SOLN Apply 1 drop to eye 2 (two) times daily. 03/27/22   [provider]    Family History Family History  Problem Relation Age of Onset   Hypertension Mother    Hypertension Father    Cancer Son    Colon cancer Neg Hx    Rectal cancer Neg Hx    Stomach cancer Neg Hx    Esophageal cancer Neg Hx     Social History Social History   Tobacco Use   Smoking status: Former    Packs/day: 0.25    Years: 5.00    Additional  pack years: 0.00    Total pack years: 1.25    Types: Cigarettes    Quit date: 11/18/1989    Years since quitting: 33.3    Passive  exposure: Never   Smokeless tobacco: Never  Vaping Use   Vaping Use: Never used  Substance Use Topics   Alcohol use: No   Drug use: No     Allergies   Demerol [meperidine], Morphine and related, Other, Cardizem  [diltiazem hcl], Diltiazem hcl, Gabapentin, Lisinopril, Lyrica [pregabalin], and Tramadol   Review of Systems Review of Systems Per HPI  Physical Exam Triage Vital Signs ED Triage Vitals  Enc Vitals Group     BP 03/16/23 1513 116/67     Pulse Rate 03/16/23 1513 85     Resp 03/16/23 1513 18     Temp 03/16/23 1513 98 F (36.7 C)     Temp Source 03/16/23 1513 Oral     SpO2 03/16/23 1513 98 %     Weight --      Height --      Head Circumference --      Peak Flow --      Pain Score 03/16/23 1516 7     Pain Loc --      Pain Edu? --      Excl. in GC? --    No data found.  Updated Vital Signs BP 116/67 (BP Location: Right Arm)   Pulse 85   Temp 98 F (36.7 C) (Oral)   Resp 18   SpO2 98%   Visual Acuity Right Eye Distance:   Left Eye Distance:   Bilateral Distance:    Right Eye Near:   Left Eye Near:    Bilateral Near:     Physical Exam Vitals and nursing note reviewed.  Constitutional:      General: She is not in acute distress.    Appearance: Normal appearance.  HENT:     Head: Normocephalic.     Right Ear: Tympanic membrane, ear canal and external ear normal.     Left Ear: Tympanic membrane, ear canal and external ear normal.     Nose: Congestion present. No rhinorrhea.     Right Turbinates: Enlarged and swollen.     Left Turbinates: Enlarged and swollen.     Right Sinus: Maxillary sinus tenderness present. No frontal sinus tenderness.     Left Sinus: Maxillary sinus tenderness present. No frontal sinus tenderness.     Mouth/Throat:     Mouth: Mucous membranes are moist.     Pharynx: Posterior oropharyngeal erythema present.  Eyes:     Extraocular Movements: Extraocular movements intact.     Conjunctiva/sclera: Conjunctivae normal.      Pupils: Pupils are equal, round, and reactive to light.  Cardiovascular:     Rate and Rhythm: Normal rate and regular rhythm.     Pulses: Normal pulses.     Heart sounds: Normal heart sounds.  Pulmonary:     Effort: Pulmonary effort is normal. No respiratory distress.     Breath sounds: Normal breath sounds. No stridor. No wheezing, rhonchi or rales.  Abdominal:     General: Bowel sounds are normal.     Palpations: Abdomen is soft.     Tenderness: There is no abdominal tenderness.  Musculoskeletal:     Cervical back: Normal range of motion.  Lymphadenopathy:     Cervical: No cervical adenopathy.  Skin:    General: Skin is warm and dry.  Neurological:  General: No focal deficit present.     Mental Status: She is alert and oriented to person, place, and time.  Psychiatric:        Mood and Affect: Mood normal.        Behavior: Behavior normal.     UC Treatments / Results  Labs (all labs ordered are listed, but only abnormal results are displayed) Labs Reviewed  POCT INFLUENZA A/B    EKG   Radiology No results found.  Procedures Procedures (including critical care time)  Medications Ordered in UC Medications - No data to display  Initial Impression / Assessment and Plan / UC Course  I have reviewed the triage vital signs and the nursing notes.  Pertinent labs & imaging results that were available during my care of the patient were reviewed by me and considered in my medical decision making (see chart for details).  The patient is well-appearing, she is in no acute distress, vital signs are stable.  Influenza test is negative.  COVID test is pending.  Patient is a candidate to receive molnupiravir if her COVID test is positive.  Patient is currently taking amoxicillin for sinusitis.  Will have patient continue medication at this time.  Will add prednisone 40 mg for the next 5 days to help with bilateral middle ear effusions, and chronic allergic rhinitis.  Patient  was also prescribed Promethazine DM for her cough at nighttime.  Patient advised to continue her current allergy regimen.  Supportive care recommendations were provided and discussed with the patient to include warm salt water gargles as needed for throat pain or discomfort, ibuprofen or Tylenol as needed for pain or discomfort, and increasing fluids and allowing for plenty of rest.  Patient was advised that if symptoms do not improve after completing the amoxicillin, it is recommended that she follow-up in this clinic or with her primary care physician for further evaluation.  Patient is in agreement with this plan of care and verbalizes understanding.  All questions were answered.  Patient stable for discharge.   Final Clinical Impressions(s) / UC Diagnoses   Final diagnoses:  None   Discharge Instructions   None    ED Prescriptions   None    PDMP not reviewed this encounter.   Abran Cantor, NP 03/16/23 1605

## 2023-03-16 NOTE — ED Triage Notes (Signed)
Headache nasal congestions, sore throat, ears stopped up, cough, facial pain since Friday.  Saw PCP on Thursday for itchy and swollen eye lids.  Home covid test on Friday was negative.   Has been taking mucinex with little relief.

## 2023-03-17 ENCOUNTER — Telehealth: Payer: Self-pay

## 2023-03-17 ENCOUNTER — Telehealth: Payer: Self-pay | Admitting: Physician Assistant

## 2023-03-17 LAB — SARS CORONAVIRUS 2 (TAT 6-24 HRS): SARS Coronavirus 2: NEGATIVE

## 2023-03-17 MED ORDER — PREDNISONE 10 MG PO TABS: 40.0000 mg | ORAL_TABLET | Freq: Every day | ORAL | 0 refills | Status: AC

## 2023-03-17 MED ORDER — PROMETHAZINE-DM 6.25-15 MG/5ML PO SYRP: 5.0000 mL | ORAL_SOLUTION | Freq: Four times a day (QID) | ORAL | 0 refills | Status: DC | PRN

## 2023-03-17 NOTE — Telephone Encounter (Signed)
Rx for Phenergan  Dm for cough

## 2023-03-17 NOTE — Telephone Encounter (Signed)
Pt came today stating that she did not receive her medication prednisone, and promethazine cough syrup that the provider spoke with her about yesterday. Spoke with provider here today K.S and she aggred to send medication that was noted on her documentation from her visit yesterday. [facility]

## 2023-03-19 MED ORDER — FLUCONAZOLE 150 MG PO TABS: ORAL_TABLET | ORAL | 1 refills | Status: DC

## 2023-03-19 NOTE — Addendum Note (Signed)
Addended by: Lazaro Arms on: 03/19/2023 09:50 AM   Modules accepted: Orders

## 2023-03-19 NOTE — Telephone Encounter (Signed)
U/S scheduled for 5/20. Patient has recently started course of antibiotics and prednisone for sinus infection and has now developed a yeast infection. She is requesting Diflucan be sent to her pharmacy if possible.

## 2023-03-26 ENCOUNTER — Other Ambulatory Visit: Payer: Self-pay | Admitting: Allergy and Immunology

## 2023-03-26 ENCOUNTER — Other Ambulatory Visit: Payer: Self-pay | Admitting: Obstetrics & Gynecology

## 2023-04-01 ENCOUNTER — Other Ambulatory Visit: Payer: Self-pay | Admitting: Allergy and Immunology

## 2023-04-01 ENCOUNTER — Ambulatory Visit: Payer: 59 | Admitting: Allergy and Immunology

## 2023-04-01 ENCOUNTER — Ambulatory Visit: Payer: Self-pay

## 2023-04-01 VITALS — BP 110/78 | HR 90 | Temp 98.6°F | Resp 16 | Ht 62.0 in | Wt 129.0 lb

## 2023-04-01 DIAGNOSIS — H1010 Acute atopic conjunctivitis, unspecified eye: Secondary | ICD-10-CM

## 2023-04-01 DIAGNOSIS — J309 Allergic rhinitis, unspecified: Secondary | ICD-10-CM | POA: Diagnosis not present

## 2023-04-01 DIAGNOSIS — H1044 Vernal conjunctivitis: Secondary | ICD-10-CM

## 2023-04-01 DIAGNOSIS — J3089 Other allergic rhinitis: Secondary | ICD-10-CM

## 2023-04-01 DIAGNOSIS — G500 Trigeminal neuralgia: Secondary | ICD-10-CM

## 2023-04-01 DIAGNOSIS — J4550 Severe persistent asthma, uncomplicated: Secondary | ICD-10-CM

## 2023-04-01 DIAGNOSIS — K219 Gastro-esophageal reflux disease without esophagitis: Secondary | ICD-10-CM

## 2023-04-01 DIAGNOSIS — J301 Allergic rhinitis due to pollen: Secondary | ICD-10-CM

## 2023-04-01 DIAGNOSIS — H1013 Acute atopic conjunctivitis, bilateral: Secondary | ICD-10-CM

## 2023-04-01 MED ORDER — PANTOPRAZOLE SODIUM 40 MG PO TBEC: 40.0000 mg | DELAYED_RELEASE_TABLET | Freq: Two times a day (BID) | ORAL | 1 refills | Status: DC

## 2023-04-01 MED ORDER — BUDESONIDE 32 MCG/ACT NA SUSP
NASAL | 5 refills | Status: DC
Start: 2023-04-01 — End: 2024-03-09

## 2023-04-01 MED ORDER — CETIRIZINE HCL 10 MG PO TABS: 10.0000 mg | ORAL_TABLET | Freq: Two times a day (BID) | ORAL | 1 refills | Status: DC

## 2023-04-01 MED ORDER — EPINEPHRINE 0.3 MG/0.3ML IJ SOAJ: 0.3000 mg | INTRAMUSCULAR | 1 refills | Status: DC | PRN

## 2023-04-01 MED ORDER — CYPROHEPTADINE HCL 4 MG PO TABS: ORAL_TABLET | ORAL | 5 refills | Status: DC

## 2023-04-01 MED ORDER — AIRSUPRA 90-80 MCG/ACT IN AERO: 2.0000 | INHALATION_SPRAY | RESPIRATORY_TRACT | 1 refills | Status: DC | PRN

## 2023-04-01 MED ORDER — AZELASTINE HCL 0.1 % NA SOLN: 2.0000 | Freq: Two times a day (BID) | NASAL | 5 refills | Status: DC

## 2023-04-01 MED ORDER — MONTELUKAST SODIUM 10 MG PO TABS: ORAL_TABLET | ORAL | 5 refills | Status: DC

## 2023-04-01 MED ORDER — BUDESONIDE-FORMOTEROL FUMARATE 80-4.5 MCG/ACT IN AERO: 2.0000 | INHALATION_SPRAY | Freq: Two times a day (BID) | RESPIRATORY_TRACT | 5 refills | Status: DC

## 2023-04-01 NOTE — Patient Instructions (Addendum)
  1. Continue Immunotherapy at maintenance 0.25 ml Red vial .     2. Continue to Treat and prevent inflammation:   A. OTC Budesonide/ Rhinocort - 1-2 sprays each nostril daily  B. Symbicort 80 - 2 inhalations 1-2 times per day  C. montelukast 10 mg - one tablet one time per day  D. Mepolizumab injections every 4 weeks  3. Continue to Treat and prevent reflux:   A.  Pantoprazole 40 mg twice a day  4. Treat and prevent facial pain syndrome:   A. Periactin 4 mg - 1/2 - 1 tablet at bedtime  5. If needed:   A. Azelastine 2 sprays each nostril two times per day  B. OTC antihistamine - Zyrtec 10mg  1-2 times per day.   C. AirSupra - 2 inhalations every 4-6 hours  D. Epi-Pen  E. Eye treatment directed by eye doctor  6. Use a extra dose of Zyrtec 1 hour before immunotherapy injection  7. Return to clinic in 6 months or earlier if problem  8. Ask primary doctor about fall flu vaccine, pneumonia vaccine, shingles vaccine

## 2023-04-01 NOTE — Progress Notes (Unsigned)
[place] [facility] [place] [facility] [place] [facility] [place] [facility] [place]   Follow-up Note  Referring Provider: Rebekah Chesterfield, NP Primary Provider: Rebekah Chesterfield, NP Date of Office Visit: 04/01/2023  Subjective:   Jennifer Mullins (DOB: 09-24-1971) is a 52 y.o. female who [facility] in re-evaluation of the following:  HPI: Nekeya returns to this clinic in evaluation of asthma, allergic rhinoconjunctivitis, history of vernal conjunctivitis, history of facial pain syndrome, dry eye syndrome, and LPR.  I last saw her in this clinic 01 October 2022.  She just finished a prolonged episode of a upper respiratory tract infection with lingering cough that went on for 2 weeks.  She was treated with an antibiotic and prednisone she is not really sure that that helped but fortunately most of this issue appear to have resolved.  Other than that single event she has really done very well while using Symbicort mostly 1 time per day and she has a rare requirement for albuterol and she can exert herself without any problem.  Her nose has been doing very well while using her nasal steroid occasionally.  She continues on immunotherapy currently at every 3 weeks without any problem.  Her reflux is under very good control on pantoprazole.  Her facial pain syndrome is under very good control with intermittent use of Periactin.  Allergies as of 04/01/2023       Reactions   Demerol [meperidine] Shortness Of Breath   FLUSHING AND SHORTNESS OF BREATH   Morphine And Related Shortness Of Breath   Flushed and hot hyper Flushed and hot hyper Flushed and hot hyper Flushed and hot hyper   Other Shortness Of Breath   Flushed and hot hyper Flushed and hot hyper Flushed and hot hyper Flushed and hot hyper Flushed and hot hyper   Cardizem  [diltiazem Hcl] Other (See Comments)   Diltiazem Hcl Hives   Gabapentin Other (See Comments)   Chest pain and elevated heart rate    Lisinopril    Caused cough   Lyrica [pregabalin]    Chest pain, elevated heart rate   Tramadol Other (See Comments)   Tears up stomach        Medication List    albuterol 108 (90 Base) MCG/ACT inhaler Commonly known as: VENTOLIN HFA inhale 2 puffs BY MOUTH EVERY 6 HOURS AS NEEDED FOR wheezing/shortness of breath   ALPRAZolam 0.5 MG tablet Commonly known as: XANAX TAKE 1 TABLET BY MOUTH TWICE DAILY AS NEEDED. MAY TAKE ONE MORE TABLET BEFORE FOR ALLERGY SHOTS ONCE A WEEK (30 DAY supply)   aspirin 81 MG chewable tablet Chew 81 mg by mouth daily. Take 2 tablets for a total of 162 mg daily   Auvi-Q 0.3 mg/0.3 mL Soaj injection Generic drug: EPINEPHrine Use for life threatening allergic reactions   azelastine 0.05 % ophthalmic solution Commonly known as: OPTIVAR Apply 1 drop to eye 2 (two) times daily.   azelastine 0.1 % nasal spray Commonly known as: ASTELIN Place 2 sprays into both nostrils 2 (two) times daily.   brimonidine-timolol 0.2-0.5 % ophthalmic solution Commonly known as: COMBIGAN Apply 1 drop to eye 2 (two) times daily.   budesonide-formoterol 80-4.5 MCG/ACT inhaler Commonly known as: SYMBICORT Inhale 2 puffs into the lungs 2 (two) times daily.   cetirizine 10 MG tablet Commonly known as: ZYRTEC Take 1 tablet (10 mg total) by mouth 2 (two) times daily.   cromolyn 4 % ophthalmic solution Commonly known  as: OPTICROM Place 1 drop into both eyes 4 (four) times daily.   [facility] Budesonide 32 MCG/ACT nasal spray Generic drug: budesonide USE 1-2 SPRAYS INTO BOTH NOSTRILS EVERY DAY   cycloSPORINE 0.05 % ophthalmic emulsion Commonly known as: RESTASIS Place 1 drop into both eyes 2 (two) times daily.   cyproheptadine 4 MG tablet Commonly known as: PERIACTIN Take 1/2 to 1 tablet at bedtime.   diclofenac 0.1 % ophthalmic solution Commonly known as: VOLTAREN 1 drop 2 (two) times daily.   diclofenac Sodium 1 % Gel Commonly known as: VOLTAREN Apply 2 g  topically 4 (four) times daily.   Diethylpropion HCl CR 75 MG Tb24 Take 1 tablet (75 mg total) by mouth daily.   dorzolamide-timolol 2-0.5 % ophthalmic solution Commonly known as: COSOPT Place 1 drop into both eyes 2 (two) times daily.   Eliquis 2.5 MG Tabs tablet Generic drug: apixaban Take 2.5 mg by mouth 2 (two) times daily.   fluticasone 50 MCG/ACT nasal spray Commonly known as: FLONASE take 1-2 SPRAY in each nostril three times WEEKLY   HYDROcodone-acetaminophen 7.5-325 MG tablet Commonly known as: NORCO Take 1 tablet by mouth every 6 (six) hours as needed for moderate pain.   losartan 25 MG tablet Commonly known as: COZAAR Take 1 tablet (25 mg total) by mouth daily. What changed: additional instructions   lubiprostone 8 MCG capsule Commonly known as: AMITIZA SMARTSIG:1 Capsule(s) By Mouth 1-4 Times Daily   megestrol 40 MG tablet Commonly known as: MEGACE TAKE 3 TABLETS DAILY BY MOUTH FOR 5 DAYS, 2 TABLETS DAILY FOR 5 DAYS, THEN 1 TABLET DAILY   metFORMIN 500 MG tablet Commonly known as: GLUCOPHAGE TAKE 1 TABLET BY MOUTH EVERY DAY WITH BREAKFAST   montelukast 10 MG tablet Commonly known as: SINGULAIR TAKE 1 TABLET BY MOUTH EVERYDAY AT BEDTIME   Nucala 100 MG injection Generic drug: mepolizumab Inject 100 mg into the skin every 28 (twenty-eight) days.   pantoprazole 40 MG tablet Commonly known as: PROTONIX Take 1 tablet (40 mg total) by mouth 2 (two) times daily.   pravastatin 40 MG tablet Commonly known as: PRAVACHOL Take 1 tablet (40 mg total) by mouth daily. What changed: when to take this   progesterone 200 MG capsule Commonly known as: PROMETRIUM TAKE 1 CAPSULE BY MOUTH AT BEDTIME   spironolactone 50 MG tablet Commonly known as: ALDACTONE TAKE 1 TABLET BY MOUTH TWICE DAILY   UNABLE TO FIND Allergy shots-both arms-weekly   Vaniqa 13.9 % cream Generic drug: Eflornithine HCl APPLY TO AFFECTED AREA TWICE DAILY    Past Medical History:   Diagnosis Date   Anxiety    Arm vein blood clot, left    Arthritis    Asthma    Bilateral calcaneal spurs    Bilateral polycystic ovarian syndrome    Bulging lumbar disc    Carpal tunnel syndrome, bilateral    COPD (chronic obstructive pulmonary disease) (HCC)    BRONCHITIS   Diabetes mellitus without complication (HCC)    TYPE 2   DX  4-5 YRS AGO   ETD (Eustachian tube dysfunction), bilateral    left worse than right   Fibromyalgia    GAD (generalized anxiety disorder)    GERD (gastroesophageal reflux disease)    TAKES PRESCRIPTION MEDS   Glaucoma    BOTH EYES   Hyperlipidemia    Hypertension    Hypothyroidism    NODULES ON THYROID   PONV (postoperative nausea and vomiting)    Pulmonary embolism (HCC)  Tachycardia    TMJ (dislocation of temporomandibular joint)    Trigger finger    Trigger finger of left thumb     Past Surgical History:  Procedure Laterality Date   ANTERIOR CERVICAL DECOMP/DISCECTOMY FUSION N/A 11/13/2016   Procedure: Cervical five-six, Cervical six-seven Anterior Cervical Discectomy and Fusion, Allograft, Plate;  Surgeon: Eldred Manges, MD;  Location: [facility] OR;  Service: Orthopedics;  Laterality: N/A;   BREAST SURGERY     CARPAL TUNNEL RELEASE     DILATION AND CURETTAGE OF UTERUS     EXPLORATORY LAPAROTOMY     LIPOMA EXCISION Left 06/15/2018   Procedure: LEFT CALF LIPOMA EXCISION;  Surgeon: Eldred Manges, MD;  Location: [facility];  Service: Orthopedics;  Laterality: Left;   NASAL SINUS SURGERY  07/22/2013   Dr. Andrey Campanile in [place]   plate and six screws in back     REDUCTION MAMMAPLASTY Bilateral 2000   TRIGGER FINGER RELEASE Right 06/15/2018   Procedure: RIGHT TRIGGER THUMB RELEASE;  Surgeon: Eldred Manges, MD;  Location: [facility];  Service: Orthopedics;  Laterality: Right;    Review of systems negative except as noted in HPI / PMHx or noted below:  Review of Systems  Constitutional: Negative.   HENT: Negative.     Eyes: Negative.   Respiratory: Negative.    Cardiovascular: Negative.   Gastrointestinal: Negative.   Genitourinary: Negative.   Musculoskeletal: Negative.   Skin: Negative.   Neurological: Negative.   Endo/Heme/Allergies: Negative.   Psychiatric/Behavioral: Negative.       Objective:   Vitals:   04/01/23 1454  BP: 110/78  Pulse: 90  Resp: 16  Temp: 98.6 F (37 C)  SpO2: 99%   Height: 5\' 2"  (157.5 cm)  Weight: 129 lb (58.5 kg)   Physical Exam Constitutional:      Appearance: She is not diaphoretic.  HENT:     Head: Normocephalic.     Right Ear: Tympanic membrane, ear canal and external ear normal.     Left Ear: Tympanic membrane, ear canal and external ear normal.     Nose: Nose normal. No mucosal edema or rhinorrhea.     Mouth/Throat:     Pharynx: Uvula midline. No oropharyngeal exudate.  Eyes:     Conjunctiva/sclera: Conjunctivae normal.  Neck:     Thyroid: No thyromegaly.     Trachea: Trachea normal. No tracheal tenderness or tracheal deviation.  Cardiovascular:     Rate and Rhythm: Normal rate and regular rhythm.     Heart sounds: Normal heart sounds, S1 normal and S2 normal. No murmur heard. Pulmonary:     Effort: No respiratory distress.     Breath sounds: Normal breath sounds. No stridor. No wheezing or rales.  Lymphadenopathy:     Head:     Right side of head: No tonsillar adenopathy.     Left side of head: No tonsillar adenopathy.     Cervical: No cervical adenopathy.  Skin:    Findings: No erythema or rash.     Nails: There is no clubbing.  Neurological:     Mental Status: She is alert.     Diagnostics: Spirometry was performed and demonstrated an FEV1 of 1.93 at 90 % of predicted.  Assessment and Plan:   1. Severe persistent asthma without complication   2. Perennial allergic rhinitis   3. Seasonal allergic rhinitis due to pollen   4. Seasonal allergic conjunctivitis   5. Vernal conjunctivitis of both eyes  6. Facial pain syndrome    7. LPRD (laryngopharyngeal reflux disease)    1. Continue Immunotherapy at maintenance 0.25 ml Red vial .     2. Continue to Treat and prevent inflammation:   A. OTC Budesonide/ Rhinocort - 1-2 sprays each nostril daily  B. Symbicort 80 - 2 inhalations 1-2 times per day  C. montelukast 10 mg - one tablet one time per day  D. Mepolizumab injections every 4 weeks  3. Continue to Treat and prevent reflux:   A.  Pantoprazole 40 mg twice a day  4. Treat and prevent facial pain syndrome:   A. Periactin 4 mg - 1/2 - 1 tablet at bedtime  5. If needed:   A. Azelastine 2 sprays each nostril two times per day  B. OTC antihistamine - Zyrtec 10mg  1-2 times per day.   C. AirSupra - 2 inhalations every 4-6 hours  D. Epi-Pen  E. Eye treatment directed by eye doctor  6. Use a extra dose of Zyrtec 1 hour before immunotherapy injection  7. Return to clinic in 6 months or earlier if problem  8. Ask primary doctor about fall flu vaccine, pneumonia vaccine, shingles vaccine  Ardine is really doing very well at this point in time other than the fact that she contracted a viral respiratory tract infection at the end of April that fortunately has resolved.  She has a good understanding of her disease state and understands how to use her medications appropriately depending on disease activity.  She will continue on anti-inflammatory agents for her airway, pantoprazole for reflux, Periactin for headache, and immunotherapy.  Will see her back in this clinic in 6 months or earlier if there is a problem.   Laurette Schimke, MD Allergy / Immunology [facility]

## 2023-04-02 ENCOUNTER — Encounter: Payer: Self-pay | Admitting: Allergy and Immunology

## 2023-04-04 ENCOUNTER — Other Ambulatory Visit: Payer: Self-pay | Admitting: Obstetrics & Gynecology

## 2023-04-04 DIAGNOSIS — D219 Benign neoplasm of connective and other soft tissue, unspecified: Secondary | ICD-10-CM

## 2023-04-07 ENCOUNTER — Ambulatory Visit (INDEPENDENT_AMBULATORY_CARE_PROVIDER_SITE_OTHER): Payer: 59

## 2023-04-07 DIAGNOSIS — D219 Benign neoplasm of connective and other soft tissue, unspecified: Secondary | ICD-10-CM | POA: Diagnosis not present

## 2023-04-07 NOTE — Telephone Encounter (Signed)
Erroneous encounter will close.

## 2023-04-07 NOTE — Progress Notes (Signed)
PELVIC US TA/TV: heterogeneous anteverted uterus with multiple fibroids (#1) mid/left submucosal fibroid 3.2 x 2.4 x 2.3 cm,(#2) fundal subserosal fibroid 1.5 x 1.3 x 1.5 cm,(#3) posterior intramural fibroid 1.4 x .8 x 1.4 cm,EEC 5.4 mm,normal left ovary,simple right ovarian cyst 2.2 x 2 x 1.6 cm,no free fluid,no pain during ultrasound

## 2023-04-08 ENCOUNTER — Encounter: Payer: Self-pay | Admitting: Obstetrics & Gynecology

## 2023-04-08 ENCOUNTER — Ambulatory Visit (INDEPENDENT_AMBULATORY_CARE_PROVIDER_SITE_OTHER): Payer: 59 | Admitting: Obstetrics & Gynecology

## 2023-04-08 ENCOUNTER — Telehealth: Payer: Self-pay | Admitting: Allergy and Immunology

## 2023-04-08 VITALS — BP 124/80 | HR 70 | Ht 62.0 in | Wt 131.0 lb

## 2023-04-08 DIAGNOSIS — Z01419 Encounter for gynecological examination (general) (routine) without abnormal findings: Secondary | ICD-10-CM | POA: Diagnosis not present

## 2023-04-08 DIAGNOSIS — D219 Benign neoplasm of connective and other soft tissue, unspecified: Secondary | ICD-10-CM

## 2023-04-08 DIAGNOSIS — N924 Excessive bleeding in the premenopausal period: Secondary | ICD-10-CM | POA: Diagnosis not present

## 2023-04-08 MED ORDER — PROGESTERONE 200 MG PO CAPS: 200.0000 mg | ORAL_CAPSULE | Freq: Every day | ORAL | 11 refills | Status: DC

## 2023-04-08 MED ORDER — TERCONAZOLE 0.4 % VA CREA: 1.0000 | TOPICAL_CREAM | Freq: Every day | VAGINAL | 0 refills | Status: DC

## 2023-04-08 NOTE — Progress Notes (Signed)
Subjective:     Jennifer Mullins is a 52 y.o. female here for a routine exam.  No LMP recorded. Patient is perimenopausal. G1P0101 Birth Control Method:  menopause Menstrual Calendar(currently): amenorrhea  Current complaints: yeast.   Current acute medical issues:  none   Recent Gynecologic History No LMP recorded. Patient is perimenopausal. Last Pap: 2023,  normal Last mammogram: 1/24,  normal  Past Medical History:  Diagnosis Date   Anxiety    Arm vein blood clot, left    Arthritis    Asthma    Bilateral calcaneal spurs    Bilateral polycystic ovarian syndrome    Bulging lumbar disc    Carpal tunnel syndrome, bilateral    COPD (chronic obstructive pulmonary disease) (HCC)    BRONCHITIS   Diabetes mellitus without complication (HCC)    TYPE 2   DX  4-5 YRS AGO   ETD (Eustachian tube dysfunction), bilateral    left worse than right   Fibromyalgia    GAD (generalized anxiety disorder)    GERD (gastroesophageal reflux disease)    TAKES PRESCRIPTION MEDS   Glaucoma    BOTH EYES   Hyperlipidemia    Hypertension    Hypothyroidism    NODULES ON THYROID   PONV (postoperative nausea and vomiting)    Pulmonary embolism (HCC)    Tachycardia    TMJ (dislocation of temporomandibular joint)    Trigger finger    Trigger finger of left thumb     Past Surgical History:  Procedure Laterality Date   ANTERIOR CERVICAL DECOMP/DISCECTOMY FUSION N/A 11/13/2016   Procedure: Cervical five-six, Cervical six-seven Anterior Cervical Discectomy and Fusion, Allograft, Plate;  Surgeon: Eldred Manges, MD;  Location: [facility];  Service: Orthopedics;  Laterality: N/A;   BREAST SURGERY     CARPAL TUNNEL RELEASE     DILATION AND CURETTAGE OF UTERUS     EXPLORATORY LAPAROTOMY     LIPOMA EXCISION Left 06/15/2018   Procedure: LEFT CALF LIPOMA EXCISION;  Surgeon: Eldred Manges, MD;  Location: [facility];  Service: Orthopedics;  Laterality: Left;   NASAL SINUS SURGERY  07/22/2013   Dr.  Andrey Campanile in [place]   plate and six screws in back     REDUCTION MAMMAPLASTY Bilateral 2000   TRIGGER FINGER RELEASE Right 06/15/2018   Procedure: RIGHT TRIGGER THUMB RELEASE;  Surgeon: Eldred Manges, MD;  Location: [facility];  Service: Orthopedics;  Laterality: Right;    OB History     Gravida  1   Para  1   Term      Preterm  1   AB      Living  1      SAB      IAB      Ectopic      Multiple      Live Births  1           Social History   Socioeconomic History   Marital status: Married    Spouse name: Not on file   Number of children: Not on file   Years of education: Not on file   Highest education level: Not on file  Occupational History   Occupation: Unemployed  Tobacco Use   Smoking status: Former    Packs/day: 0.25    Years: 5.00    Additional pack years: 0.00    Total pack years: 1.25    Types: Cigarettes    Quit date: 11/18/1989    Years  since quitting: 33.4    Passive exposure: Never   Smokeless tobacco: Never  Vaping Use   Vaping Use: Never used  Substance and Sexual Activity   Alcohol use: No   Drug use: No   Sexual activity: Yes    Birth control/protection: None  Other Topics Concern   Not on file  Social History Narrative   Not on file   Social Determinants of Health   Financial Resource Strain: Patient Declined (03/28/2022)   Overall Financial Resource Strain (CARDIA)    Difficulty of Paying Living Expenses: Patient declined  Food Insecurity: No Food Insecurity (03/28/2022)   Hunger Vital Sign    Worried About Running Out of Food in the Last Year: Never true    Ran Out of Food in the Last Year: Never true  Transportation Needs: No Transportation Needs (03/28/2022)   PRAPARE - Administrator, Civil Service (Medical): No    Lack of Transportation (Non-Medical): No  Physical Activity: Patient Declined (03/28/2022)   Exercise Vital Sign    Days of Exercise per Week: Patient declined    Minutes of Exercise  per Session: Patient declined  Stress: No Stress Concern Present (03/28/2022)   [employer] of Occupational Health - Occupational Stress Questionnaire    Feeling of Stress : Not at all  Social Connections: Socially Integrated (03/28/2022)   Social Connection and Isolation Panel [NHANES]    Frequency of Communication with Friends and Family: More than three times a week    Frequency of Social Gatherings with Friends and Family: Once a week    Attends Religious Services: More than 4 times per year    Active Member of [employer] or Organizations: Yes    Attends Engineer, structural: More than 4 times per year    Marital Status: Married    Family History  Problem Relation Age of Onset   Hypertension Mother    Hypertension Father    Cancer Son    Colon cancer Neg Hx    Rectal cancer Neg Hx    Stomach cancer Neg Hx    Esophageal cancer Neg Hx      Current Outpatient Medications:    albuterol (VENTOLIN HFA) 108 (90 Base) MCG/ACT inhaler, inhale 2 puffs BY MOUTH EVERY 6 HOURS AS NEEDED FOR wheezing/shortness of breath, Disp: 18 g, Rfl: 1   ALPRAZolam (XANAX) 0.5 MG tablet, TAKE 1 TABLET BY MOUTH TWICE DAILY AS NEEDED. MAY TAKE ONE MORE TABLET BEFORE FOR ALLERGY SHOTS ONCE A WEEK (30 DAY supply), Disp: 65 tablet, Rfl: 5   aspirin 81 MG chewable tablet, Chew 81 mg by mouth daily. Take 2 tablets for a total of 162 mg daily, Disp: , Rfl:    azelastine (ASTELIN) 0.1 % nasal spray, Place 2 sprays into both nostrils 2 (two) times daily., Disp: 30 mL, Rfl: 5   azelastine (OPTIVAR) 0.05 % ophthalmic solution, Apply 1 drop to eye 2 (two) times daily., Disp: , Rfl:    budesonide ([employer] BUDESONIDE) 32 MCG/ACT nasal spray, USE 1-2 SPRAYS INTO BOTH NOSTRILS EVERY DAY, Disp: 8.43 mL, Rfl: 5   budesonide-formoterol (SYMBICORT) 80-4.5 MCG/ACT inhaler, Inhale 2 puffs into the lungs 2 (two) times daily., Disp: 10.2 g, Rfl: 5   cetirizine (ZYRTEC) 10 MG tablet, Take 1 tablet (10 mg total) by mouth 2  (two) times daily., Disp: 180 tablet, Rfl: 1   cromolyn (OPTICROM) 4 % ophthalmic solution, Place 1 drop into both eyes 4 (four) times daily. , Disp: ,  Rfl: 1   cycloSPORINE (RESTASIS) 0.05 % ophthalmic emulsion, Place 1 drop into both eyes 2 (two) times daily., Disp: , Rfl:    cyproheptadine (PERIACTIN) 4 MG tablet, Take 1/2 to 1 tablet at bedtime., Disp: 30 tablet, Rfl: 5   diclofenac (VOLTAREN) 0.1 % ophthalmic solution, 1 drop 2 (two) times daily., Disp: , Rfl:    diclofenac Sodium (VOLTAREN) 1 % GEL, Apply 2 g topically 4 (four) times daily., Disp: , Rfl:    Diethylpropion HCl CR 75 MG TB24, Take 1 tablet (75 mg total) by mouth daily., Disp: 30 tablet, Rfl: 5   dorzolamide-timolol (COSOPT) 22.3-6.8 MG/ML ophthalmic solution, Place 1 drop into both eyes 2 (two) times daily., Disp: , Rfl:    ELIQUIS 2.5 MG TABS tablet, Take 2.5 mg by mouth 2 (two) times daily., Disp: , Rfl:    EPINEPHrine (EPIPEN 2-PAK) 0.3 mg/0.3 mL IJ SOAJ injection, Inject 0.3 mg into the muscle as needed for anaphylaxis., Disp: 0.3 mL, Rfl: 1   HYDROcodone-acetaminophen (NORCO) 7.5-325 MG tablet, Take 1 tablet by mouth every 6 (six) hours as needed for moderate pain., Disp: , Rfl:    lubiprostone (AMITIZA) 8 MCG capsule, SMARTSIG:1 Capsule(s) By Mouth 1-4 Times Daily, Disp: , Rfl:    mepolizumab (NUCALA) 100 MG injection, Inject 100 mg into the skin every 28 (twenty-eight) days., Disp: 1 each, Rfl: 11   metFORMIN (GLUCOPHAGE) 500 MG tablet, TAKE 1 TABLET BY MOUTH EVERY DAY WITH BREAKFAST, Disp: 90 tablet, Rfl: 3   montelukast (SINGULAIR) 10 MG tablet, TAKE 1 TABLET BY MOUTH EVERYDAY AT BEDTIME, Disp: 30 tablet, Rfl: 5   pantoprazole (PROTONIX) 40 MG tablet, Take 1 tablet (40 mg total) by mouth 2 (two) times daily., Disp: 180 tablet, Rfl: 1   pravastatin (PRAVACHOL) 40 MG tablet, Take 1 tablet (40 mg total) by mouth daily. (Patient taking differently: Take 40 mg by mouth 2 (two) times daily.), Disp: 90 tablet, Rfl: 1    spironolactone (ALDACTONE) 50 MG tablet, TAKE 1 TABLET BY MOUTH TWICE DAILY, Disp: 180 tablet, Rfl: 1   terconazole (TERAZOL 7) 0.4 % vaginal cream, Place 1 applicator vaginally at bedtime., Disp: 45 g, Rfl: 0   UNABLE TO FIND, Allergy shots-both arms-weekly, Disp: , Rfl:    VANIQA 13.9 % cream, APPLY TO AFFECTED AREA TWICE DAILY, Disp: 45 g, Rfl: 11   AIRSUPRA 90-80 MCG/ACT AERO, TAKE 2 PUFFS BY MOUTH EVERY 4 HOURS AS NEEDED (Patient not taking: Reported on 04/08/2023), Disp: 11 g, Rfl: 1   AUVI-Q 0.3 MG/0.3ML SOAJ injection, Use for life threatening allergic reactions (Patient not taking: Reported on 04/08/2023), Disp: 4 each, Rfl: 3   brimonidine-timolol (COMBIGAN) 0.2-0.5 % ophthalmic solution, Apply 1 drop to eye 2 (two) times daily. (Patient not taking: Reported on 04/08/2023), Disp: , Rfl:    fluticasone (FLONASE) 50 MCG/ACT nasal spray, take 1-2 SPRAY in each nostril three times WEEKLY (Patient not taking: Reported on 04/08/2023), Disp: 48 g, Rfl: 2   losartan (COZAAR) 25 MG tablet, Take 1 tablet (25 mg total) by mouth daily. (Patient not taking: Reported on 04/08/2023), Disp: 90 tablet, Rfl: 3   megestrol (MEGACE) 40 MG tablet, TAKE 3 TABLETS DAILY BY MOUTH FOR 5 DAYS, 2 TABLETS DAILY FOR 5 DAYS, THEN 1 TABLET DAILY (Patient not taking: Reported on 04/08/2023), Disp: 45 tablet, Rfl: 4   progesterone (PROMETRIUM) 200 MG capsule, Take 1 capsule (200 mg total) by mouth at bedtime., Disp: 30 capsule, Rfl: 11  Current Facility-Administered Medications:  Mepolizumab SOLR 100 mg, 100 mg, Subcutaneous, Q28 days, Kozlow, Alvira Philips, MD, 100 mg at 03/14/23 1334  Review of Systems  Review of Systems  Constitutional: Negative for fever, chills, weight loss, malaise/fatigue and diaphoresis.  HENT: Negative for hearing loss, ear pain, nosebleeds, congestion, sore throat, neck pain, tinnitus and ear discharge.   Eyes: Negative for blurred vision, double vision, photophobia, pain, discharge and redness.   Respiratory: Negative for cough, hemoptysis, sputum production, shortness of breath, wheezing and stridor.   Cardiovascular: Negative for chest pain, palpitations, orthopnea, claudication, leg swelling and PND.  Gastrointestinal: negative for abdominal pain. Negative for heartburn, nausea, vomiting, diarrhea, constipation, blood in stool and melena.  Genitourinary: Negative for dysuria, urgency, frequency, hematuria and flank pain.  Musculoskeletal: Negative for myalgias, back pain, joint pain and falls.  Skin: Negative for itching and rash.  Neurological: Negative for dizziness, tingling, tremors, sensory change, speech change, focal weakness, seizures, loss of consciousness, weakness and headaches.  Endo/Heme/Allergies: Negative for environmental allergies and polydipsia. Does not bruise/bleed easily.  Psychiatric/Behavioral: Negative for depression, suicidal ideas, hallucinations, memory loss and substance abuse. The patient is not nervous/anxious and does not have insomnia.        Objective:  Blood pressure 124/80, pulse 70, height 5\' 2"  (1.575 m), weight 131 lb (59.4 kg).   Physical Exam  Vitals reviewed. Constitutional: She is oriented to person, place, and time. She appears well-developed and well-nourished.  HENT:  Head: Normocephalic and atraumatic.        Right Ear: External ear normal.  Left Ear: External ear normal.  Nose: Nose normal.  Mouth/Throat: Oropharynx is clear and moist.  Eyes: Conjunctivae and EOM are normal. Pupils are equal, round, and reactive to light. Right eye exhibits no discharge. Left eye exhibits no discharge. No scleral icterus.  Neck: Normal range of motion. Neck Mullins. No tracheal deviation present. No thyromegaly present.  Cardiovascular: Normal rate, regular rhythm, normal heart sounds and intact distal pulses.  Exam reveals no gallop and no friction rub.   No murmur heard. Respiratory: Effort normal and breath sounds normal. No respiratory  distress. She has no wheezes. She has no rales. She exhibits no tenderness.  GI: Soft. Bowel sounds are normal. She exhibits no distension and no mass. There is no tenderness. There is no rebound and no guarding.  Genitourinary:  Breasts no masses skin changes or nipple changes bilaterally      Vulva is normal without lesions Vagina is pink moist without discharge Cervix normal in appearance and pap is done Uterus is normal size shape and contour Adnexa is negative with normal sized ovaries   Musculoskeletal: Normal range of motion. She exhibits no edema and no tenderness.  Neurological: She is alert and oriented to person, place, and time. She has normal reflexes. She displays normal reflexes. No cranial nerve deficit. She exhibits normal muscle tone. Coordination normal.  Skin: Skin is warm and dry. No rash noted. No erythema. No pallor.  Psychiatric: She has a normal mood and affect. Her behavior is normal. Judgment and thought content normal.       Medications Ordered at today's visit: Meds ordered this encounter  Medications   terconazole (TERAZOL 7) 0.4 % vaginal cream    Sig: Place 1 applicator vaginally at bedtime.    Dispense:  45 g    Refill:  0   progesterone (PROMETRIUM) 200 MG capsule    Sig: Take 1 capsule (200 mg total) by mouth at bedtime.    Dispense:  30 capsule    Refill:  11    Other orders placed at today's visit: No orders of the defined types were placed in this encounter.  US PELVIC COMPLETE WITH TRANSVAGINAL  Result Date: 04/08/2023  .Marland Kitchenan Financial trader of Ultrasound Medicine Technical sales engineer) accredited practice Center for [facility] [address] [place] [address] Ordering Provider: Lazaro Arms, MD                                                              GYNECOLOGIC SONOGRAM Jennifer Mullins is a 52 y.o. G1P0101 No LMP recorded. Patient is perimenopausal. She is here for a pelvic sonogram for fibroids. Uterus                       6.3 x 3.8 x 5.7 cm, Total uterine volume 72 cc, heterogeneous anteverted uterus with multiple fibroids (#1) mid/left submucosal fibroid 3.2 x 2.4 x 2.3 cm,(#2) fundal subserosal fibroid 1.5 x 1.3 x 1.5 cm,(#3) posterior intramural fibroid 1.4 x .8 x 1.4 cm Endometrium          5.4 mm, symmetrical, WNL Right ovary             3.2 x 2.3 x 2.9 cm, simple right ovarian cyst 2.2 x 2 x 1.6 cm Left ovary                2.7 x 1.8 x 1.7 cm, normal No free fluid Technician Comments: PELVIC US TA/TV: heterogeneous anteverted uterus with multiple fibroids (#1) mid/left submucosal fibroid 3.2 x 2.4 x 2.3 cm,(#2) fundal subserosal fibroid 1.5 x 1.3 x 1.5 cm,(#3) posterior intramural fibroid 1.4 x .8 x 1.4 cm,EEC 5.4 mm,normal left ovary,simple right ovarian cyst 2.2 x 2 x 1.6 cm,no free fluid,no pain during ultrasound [employer] 04/07/2023 4:15 PM Clinical Impression and recommendations: I have reviewed the sonogram results above, combined with the patient's current clinical course, below are my impressions and any appropriate recommendations for management based on the sonographic findings. Uterus 72 cc volume, small with small insignficant fibroids Endometrium thin 5.4 mm homogenous Ovaries: simple cyst right ovary, small ovaries consistent with menopausal status Lazaro Arms 04/08/2023 9:25 AM       Assessment:    Normal Gyn exam.   Amenorrheic on nightly prometrium 200 mg 21 on + 7 off Fibroids, getting smaller  Plan:    Hormone replacement therapy: hormone replacement therapy: supplement + prometrium and  . Follow up in: 3 years.     Return in about 2 years (around 04/07/2025) for yearly.

## 2023-04-08 NOTE — Telephone Encounter (Signed)
Patient called and said that the Jennifer Mullins was not covered by her ins. Need something else and needs aAuvi-q lower dose.  [facility] [place]// [phone]

## 2023-04-08 NOTE — Telephone Encounter (Signed)
Spoke to pharmacy and made sure they ran copay card. Pharmacist stated that his hard copy did not have copay card information. Gave him the information and it brought [employer] down to $15 dollars. Called patient and informed of this information. Patient stated that she wanted the coupon for Auvi-Q like she used to get because the Epi-pen she had a reaction to the last time she had a reaction and went to the hospital. Patient states that if a coupon can't be found for Auvi-Q then could the dosage on the epi-pen be reduced because she thinks it is too strong and has adverse reactions to it. Advised patient that dosage is based off of weight and age but she really wanted a lower dosage if coupon could not be found. No coupons are in the office at present.

## 2023-04-09 NOTE — Telephone Encounter (Signed)
Spoke to patient about what Dr. Lucie Leather recommended. Advised patient that Auvi-Q prescription was sent to the specialty pharmacy on 10/01/2022 and expires 10/01/23. Spoke to pharmacist at specialty pharmacy and she stated that the patient's insurance does not cover then generic Auvi-Q that the program provides and that the cost would be $150 per carton out of pocket. Epi-pen was sent in on 04/01/23 to replace Auvi-Q. Patient states that she picked this up but is worried about using it due to the reaction/ worsening of her symptoms when they gave an Epi-pen to her in the hospital. Advised patient that the medication in Auvi-Q and Epi-pen are the same and the dosage is the same. Dr. Lucie Leather wants the same dosage of Epi-pen as Auvi-Q for the patient which is already the case. Tried explaining all this to the patient but she still wants the Auvi-Q instead. Advised her that there is an assistance program on the website. Patient states she had an old coupon from years ago that she is going to try. She is aware that her insurance does not cover Auvi-Q. Provided patient with the specialty pharmacy phone number.

## 2023-04-10 ENCOUNTER — Ambulatory Visit (INDEPENDENT_AMBULATORY_CARE_PROVIDER_SITE_OTHER): Payer: 59

## 2023-04-10 DIAGNOSIS — J4550 Severe persistent asthma, uncomplicated: Secondary | ICD-10-CM

## 2023-04-11 ENCOUNTER — Ambulatory Visit: Payer: 59

## 2023-04-16 DIAGNOSIS — J3081 Allergic rhinitis due to animal (cat) (dog) hair and dander: Secondary | ICD-10-CM

## 2023-04-16 NOTE — Progress Notes (Signed)
VIALS EXP 04-15-24 

## 2023-04-22 ENCOUNTER — Ambulatory Visit: Payer: 59 | Admitting: Podiatry

## 2023-05-02 ENCOUNTER — Ambulatory Visit (INDEPENDENT_AMBULATORY_CARE_PROVIDER_SITE_OTHER): Payer: 59

## 2023-05-02 DIAGNOSIS — J309 Allergic rhinitis, unspecified: Secondary | ICD-10-CM | POA: Diagnosis not present

## 2023-05-06 ENCOUNTER — Other Ambulatory Visit: Payer: Self-pay | Admitting: Allergy and Immunology

## 2023-05-06 DIAGNOSIS — J4522 Mild intermittent asthma with status asthmaticus: Secondary | ICD-10-CM

## 2023-05-09 ENCOUNTER — Ambulatory Visit (INDEPENDENT_AMBULATORY_CARE_PROVIDER_SITE_OTHER): Payer: 59

## 2023-05-09 DIAGNOSIS — J4550 Severe persistent asthma, uncomplicated: Secondary | ICD-10-CM | POA: Diagnosis not present

## 2023-05-24 ENCOUNTER — Other Ambulatory Visit: Payer: Self-pay | Admitting: Allergy and Immunology

## 2023-05-26 ENCOUNTER — Other Ambulatory Visit: Payer: Self-pay | Admitting: Allergy and Immunology

## 2023-05-26 NOTE — Telephone Encounter (Signed)
Have jeff looking into this for me

## 2023-05-29 ENCOUNTER — Ambulatory Visit (INDEPENDENT_AMBULATORY_CARE_PROVIDER_SITE_OTHER): Payer: 59

## 2023-05-29 DIAGNOSIS — J309 Allergic rhinitis, unspecified: Secondary | ICD-10-CM | POA: Diagnosis not present

## 2023-06-03 ENCOUNTER — Ambulatory Visit (INDEPENDENT_AMBULATORY_CARE_PROVIDER_SITE_OTHER): Payer: 59

## 2023-06-03 DIAGNOSIS — J309 Allergic rhinitis, unspecified: Secondary | ICD-10-CM

## 2023-06-04 ENCOUNTER — Other Ambulatory Visit: Payer: Self-pay | Admitting: Allergy and Immunology

## 2023-06-05 ENCOUNTER — Ambulatory Visit (INDEPENDENT_AMBULATORY_CARE_PROVIDER_SITE_OTHER): Payer: 59

## 2023-06-05 DIAGNOSIS — J4550 Severe persistent asthma, uncomplicated: Secondary | ICD-10-CM | POA: Diagnosis not present

## 2023-06-06 ENCOUNTER — Ambulatory Visit: Payer: 59

## 2023-06-12 ENCOUNTER — Ambulatory Visit (INDEPENDENT_AMBULATORY_CARE_PROVIDER_SITE_OTHER): Payer: 59

## 2023-06-12 DIAGNOSIS — J309 Allergic rhinitis, unspecified: Secondary | ICD-10-CM | POA: Diagnosis not present

## 2023-06-13 ENCOUNTER — Other Ambulatory Visit: Payer: Self-pay | Admitting: *Deleted

## 2023-06-13 MED ORDER — NUCALA 100 MG/ML ~~LOC~~ SOAJ: 100.0000 mg | SUBCUTANEOUS | 11 refills | Status: DC

## 2023-06-17 ENCOUNTER — Ambulatory Visit
Admission: EM | Admit: 2023-06-17 | Discharge: 2023-06-17 | Disposition: A | Discharge status: Home / Self Care | Payer: 59 | Attending: Nurse Practitioner | Admitting: Nurse Practitioner

## 2023-06-17 ENCOUNTER — Ambulatory Visit (INDEPENDENT_AMBULATORY_CARE_PROVIDER_SITE_OTHER): Payer: 59

## 2023-06-17 ENCOUNTER — Encounter: Payer: Self-pay | Admitting: Emergency Medicine

## 2023-06-17 DIAGNOSIS — S9031XA Contusion of right foot, initial encounter: Secondary | ICD-10-CM | POA: Diagnosis not present

## 2023-06-17 DIAGNOSIS — S9001XA Contusion of right ankle, initial encounter: Secondary | ICD-10-CM

## 2023-06-17 NOTE — Discharge Instructions (Addendum)
X-ray is negative for fracture or dislocation.  It appears symptoms are consistent with a contusion of the right foot and ankle. Continue over-the-counter analgesics for pain or discomfort. RICE therapy.  Rest, ice, compression, and elevation.  Apply ice for 20 minutes, remove for 1 hour repeat as needed. Gentle range of motion exercises of the right foot and ankle to increase mobility of the joint. Try to ambulate on the foot as much as tolerated and as possible to decrease recovery time. If symptoms are not improving over the next 2 to 3 weeks, recommend following up with orthopedics for further evaluation.  You have been given information for [facility] and for [facility]. Follow-up as needed.

## 2023-06-17 NOTE — ED Provider Notes (Signed)
[facility]    CSN: [id number] Arrival date & time: 06/17/23  1554      History   Chief Complaint No chief complaint on file.   HPI Jennifer Mullins is a 52 y.o. female.   The history is provided by the patient.   The patient presents for complaints of right foot and right ankle pain after a sound bar fell from an overhead height directly onto the right foot and ankle.  Incident occurred approximately 3 days ago.  Patient states that she has had swelling, bruising, and pain with movement and ambulation since the injury occurred.  She reports she has been using Voltaren gel and ice, but notes she has had minimal relief.  Patient denies previous injury or trauma, numbness, tingling, or radiation of pain.  Past Medical History:  Diagnosis Date   Anxiety    Arm vein blood clot, left    Arthritis    Asthma    Bilateral calcaneal spurs    Bilateral polycystic ovarian syndrome    Bulging lumbar disc    Carpal tunnel syndrome, bilateral    COPD (chronic obstructive pulmonary disease) (HCC)    BRONCHITIS   Diabetes mellitus without complication (HCC)    TYPE 2   DX  4-5 YRS AGO   ETD (Eustachian tube dysfunction), bilateral    left worse than right   Fibromyalgia    GAD (generalized anxiety disorder)    GERD (gastroesophageal reflux disease)    TAKES PRESCRIPTION MEDS   Glaucoma    BOTH EYES   Hyperlipidemia    Hypertension    Hypothyroidism    NODULES ON THYROID   PONV (postoperative nausea and vomiting)    Pulmonary embolism (HCC)    Tachycardia    TMJ (dislocation of temporomandibular joint)    Trigger finger    Trigger finger of left thumb     Patient Active Problem List   Diagnosis Date Noted   Chronic anxiety 05/10/2021   Chronic constipation 05/10/2021   Diabetes mellitus without complication (HCC) 05/10/2021   Displacement of lumbar intervertebral disc without myelopathy 05/10/2021   History of arthrodesis 05/10/2021   Uterine fibroid 05/10/2021    Bulging lumbar disc 02/16/2020   Primary insomnia 02/10/2020   Numbness of left hand 07/06/2019   Rectal bleeding 02/01/2019   Acne vulgaris 02/01/2019   Hirsutism 01/29/2019   Hypokalemia 01/13/2019   Acute blood loss anemia 01/13/2019   Symptomatic anemia    TMJ (dislocation of temporomandibular joint) 08/24/2018   Fibromyalgia 07/09/2018   Trigger thumb, right thumb 06/15/2018   Lipoma of lower extremity 06/15/2018   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   Dyspareunia, female 12/11/2017   Menometrorrhagia 12/11/2017   Pain in right ankle and joints of right foot 10/29/2017   Chronic pain of right knee 10/29/2017   Iron deficiency anemia due to chronic blood loss 07/07/2017   S/P cervical spinal fusion 02/27/2017   Dysfunctional uterine bleeding 11/24/2016   Anticoagulated 11/24/2016   Allergic rhinoconjunctivitis 07/28/2015   Chronic headache 07/28/2015   PCOS (polycystic ovarian syndrome) 06/20/2015   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   Hyperlipidemia 06/20/2015   GAD (generalized anxiety disorder) 05/18/2014   GERD (gastroesophageal reflux disease) 05/18/2014   Chronic sinusitis 07/02/2013   Extrinsic asthma 05/20/2013   HTN (hypertension) 05/20/2013    Past Surgical History:  Procedure Laterality Date   ANTERIOR CERVICAL DECOMP/DISCECTOMY FUSION N/A 11/13/2016   Procedure: Cervical five-six, Cervical six-seven Anterior Cervical Discectomy and Fusion, Allograft, Plate;  Surgeon: Eldred Manges, MD;  Location: [facility];  Service: Orthopedics;  Laterality: N/A;   BREAST SURGERY     CARPAL TUNNEL RELEASE     DILATION AND CURETTAGE OF UTERUS     EXPLORATORY LAPAROTOMY     LIPOMA EXCISION Left 06/15/2018   Procedure: LEFT CALF LIPOMA EXCISION;  Surgeon: Eldred Manges, MD;  Location: [facility];  Service: Orthopedics;  Laterality: Left;   NASAL SINUS SURGERY  07/22/2013   Dr. Andrey Campanile in [place]   plate and six screws in back     REDUCTION MAMMAPLASTY  Bilateral 2000   TRIGGER FINGER RELEASE Right 06/15/2018   Procedure: RIGHT TRIGGER THUMB RELEASE;  Surgeon: Eldred Manges, MD;  Location: [facility];  Service: Orthopedics;  Laterality: Right;    OB History     Gravida  1   Para  1   Term      Preterm  1   AB      Living  1      SAB      IAB      Ectopic      Multiple      Live Births  1            Home Medications    Prior to Admission medications   Medication Sig Start Date End Date Taking? Authorizing Provider  albuterol (VENTOLIN HFA) 108 (90 Base) MCG/ACT inhaler INHALE 2 PUFFS INTO THE LUNGS EVERY 6 HOURS AS NEEDED FOR WHEEZING OR SHORT OF BREATH 05/06/23   Kozlow, Alvira Philips, MD  Albuterol-Budesonide (AIRSUPRA) 90-80 MCG/ACT AERO INHALE 2 PUFFS BY MOUTH EVERY 4 HOURS AS NEEDED 05/28/23   Kozlow, Alvira Philips, MD  ALPRAZolam (XANAX) 0.5 MG tablet TAKE 1 TABLET BY MOUTH TWICE DAILY AS NEEDED. MAY TAKE ONE MORE TABLET BEFORE FOR ALLERGY SHOTS ONCE A WEEK (30 DAY supply) 01/05/20   Remus Loffler, PA-C  aspirin 81 MG chewable tablet Chew 81 mg by mouth daily. Take 2 tablets for a total of 162 mg daily    [provider]  AUVI-Q 0.3 MG/0.3ML SOAJ injection Use for life threatening allergic reactions Patient not taking: Reported on 04/08/2023 10/01/22   Kozlow, Alvira Philips, MD  azelastine (ASTELIN) 0.1 % nasal spray Place 2 sprays into both nostrils 2 (two) times daily. 04/01/23   Kozlow, Alvira Philips, MD  azelastine (OPTIVAR) 0.05 % ophthalmic solution Apply 1 drop to eye 2 (two) times daily. 02/27/21   [provider]  brimonidine-timolol (COMBIGAN) 0.2-0.5 % ophthalmic solution Apply 1 drop to eye 2 (two) times daily. Patient not taking: Reported on 04/08/2023 04/19/21   [provider]  budesonide ([facility] BUDESONIDE) 32 MCG/ACT nasal spray USE 1-2 SPRAYS INTO BOTH NOSTRILS EVERY DAY 04/01/23   Kozlow, Alvira Philips, MD  budesonide-formoterol (SYMBICORT) 80-4.5 MCG/ACT inhaler Inhale 2 puffs into the lungs 2  (two) times daily. 04/01/23   Kozlow, Alvira Philips, MD  cetirizine (ZYRTEC) 10 MG tablet Take 1 tablet (10 mg total) by mouth 2 (two) times daily. 04/01/23   Kozlow, Alvira Philips, MD  cromolyn (OPTICROM) 4 % ophthalmic solution Place 1 drop into both eyes 4 (four) times daily.  03/20/17   [provider]  cycloSPORINE (RESTASIS) 0.05 % ophthalmic emulsion Place 1 drop into both eyes 2 (two) times daily.    [provider]  cyproheptadine (PERIACTIN) 4 MG tablet Take 1/2 to 1 tablet at bedtime. 04/01/23   Kozlow, Alvira Philips, MD  diclofenac (  VOLTAREN) 0.1 % ophthalmic solution 1 drop 2 (two) times daily. 02/27/21   [provider]  diclofenac Sodium (VOLTAREN) 1 % GEL Apply 2 g topically 4 (four) times daily. 02/25/20   [provider]  Diethylpropion HCl CR 75 MG TB24 Take 1 tablet (75 mg total) by mouth daily. 01/05/20   Remus Loffler, PA-C  dorzolamide-timolol (COSOPT) 22.3-6.8 MG/ML ophthalmic solution Place 1 drop into both eyes 2 (two) times daily. 03/26/22   [provider]  ELIQUIS 2.5 MG TABS tablet Take 2.5 mg by mouth 2 (two) times daily. 11/12/21   [provider]  EPINEPHRINE 0.3 mg/0.3 mL IJ SOAJ injection INJECT 0.3 MG INTO THE MUSCLE AS NEEDED FOR ANAPHYLAXIS. 06/09/23   Kozlow, Alvira Philips, MD  fluticasone [facility]) 50 MCG/ACT nasal spray take 1-2 SPRAY in each nostril three times WEEKLY Patient not taking: Reported on 04/08/2023 07/12/22   Jessica Priest, MD  HYDROcodone-acetaminophen (NORCO) 7.5-325 MG tablet Take 1 tablet by mouth every 6 (six) hours as needed for moderate pain.    [provider]  losartan (COZAAR) 25 MG tablet Take 1 tablet (25 mg total) by mouth daily. Patient not taking: Reported on 04/08/2023 07/05/19   Remus Loffler, PA-C  lubiprostone (AMITIZA) 8 MCG capsule SMARTSIG:1 Capsule(s) By Mouth 1-4 Times Daily 02/26/22   [provider]  megestrol (MEGACE) 40 MG tablet TAKE 3 TABLETS DAILY BY MOUTH FOR 5 DAYS, 2 TABLETS DAILY  FOR 5 DAYS, THEN 1 TABLET DAILY Patient not taking: Reported on 04/08/2023 07/27/20   Lazaro Arms, MD  Mepolizumab (NUCALA) 100 MG/ML SOAJ Inject 1 mL (100 mg total) into the skin every 28 (twenty-eight) days. 06/13/23   Kozlow, Alvira Philips, MD  metFORMIN (GLUCOPHAGE) 500 MG tablet TAKE 1 TABLET BY MOUTH EVERY DAY WITH BREAKFAST 07/05/19   Remus Loffler, PA-C  montelukast (SINGULAIR) 10 MG tablet TAKE 1 TABLET BY MOUTH EVERYDAY AT BEDTIME 04/01/23   Kozlow, Alvira Philips, MD  pantoprazole (PROTONIX) 40 MG tablet Take 1 tablet (40 mg total) by mouth 2 (two) times daily. 04/01/23   Kozlow, Alvira Philips, MD  pravastatin (PRAVACHOL) 40 MG tablet Take 1 tablet (40 mg total) by mouth daily. Patient taking differently: Take 40 mg by mouth 2 (two) times daily. 02/04/20   Dettinger, Elige Radon, MD  progesterone (PROMETRIUM) 200 MG capsule Take 1 capsule (200 mg total) by mouth at bedtime. 04/08/23   Lazaro Arms, MD  spironolactone (ALDACTONE) 50 MG tablet TAKE 1 TABLET BY MOUTH TWICE DAILY 04/23/22   Janalyn Harder, MD  terconazole (TERAZOL 7) 0.4 % vaginal cream Place 1 applicator vaginally at bedtime. 04/08/23   Lazaro Arms, MD  UNABLE TO FIND Allergy shots-both arms-weekly    [provider]  VANIQA 13.9 % cream APPLY TO AFFECTED AREA TWICE DAILY 07/08/22   Janalyn Harder, MD    Family History Family History  Problem Relation Age of Onset   Hypertension Mother    Hypertension Father    Cancer Son    Colon cancer Neg Hx    Rectal cancer Neg Hx    Stomach cancer Neg Hx    Esophageal cancer Neg Hx     Social History Social History   Tobacco Use   Smoking status: Former    Current packs/day: 0.00    Average packs/day: 0.3 packs/day for 5.0 years (1.3 ttl pk-yrs)    Types: Cigarettes    Start date: 11/18/1984    Quit date: 11/18/1989  Years since quitting: 33.6    Passive exposure: Never   Smokeless tobacco: Never  Vaping Use   Vaping status: Never Used  Substance Use Topics   Alcohol use: No   Drug  use: No     Allergies   Demerol [meperidine], Morphine and codeine, Other, Cardizem  [diltiazem hcl], Diltiazem hcl, Gabapentin, Lisinopril, Lyrica [pregabalin], and Tramadol   Review of Systems Review of Systems Per HPI  Physical Exam Triage Vital Signs ED Triage Vitals  Encounter Vitals Group     BP 06/17/23 1635 138/80     Systolic BP Percentile --      Diastolic BP Percentile --      Pulse Rate 06/17/23 1635 73     Resp 06/17/23 1635 16     Temp 06/17/23 1635 98.7 F (37.1 C)     Temp Source 06/17/23 1635 Oral     SpO2 06/17/23 1635 95 %     Weight --      Height --      Head Circumference --      Peak Flow --      Pain Score 06/17/23 1637 8     Pain Loc --      Pain Education --      Exclude from Growth Chart --    No data found.  Updated Vital Signs BP 138/80 (BP Location: Right Arm)   Pulse 73   Temp 98.7 F (37.1 C) (Oral)   Resp 16   SpO2 95%   Visual Acuity Right Eye Distance:   Left Eye Distance:   Bilateral Distance:    Right Eye Near:   Left Eye Near:    Bilateral Near:     Physical Exam Vitals and nursing note reviewed.  Constitutional:      General: She is not in acute distress.    Appearance: Normal appearance.  HENT:     Head: Normocephalic.  Eyes:     Extraocular Movements: Extraocular movements intact.     Pupils: Pupils are equal, round, and reactive to light.  Pulmonary:     Effort: Pulmonary effort is normal.  Musculoskeletal:     Cervical back: Normal range of motion.     Right lower leg: Normal.     Right ankle: Swelling (mild) present. Tenderness present over the lateral malleolus and medial malleolus. Decreased range of motion. Normal pulse.     Right foot: Decreased range of motion. Normal capillary refill. Tenderness (Dorsal aspect of right foot extending to the right ankle) present. Normal pulse.     Comments: Mild bruising noted to the lateral and medial aspect of the right ankle and to the dorsal aspect of the right  foot.  Skin:    General: Skin is warm and dry.  Neurological:     General: No focal deficit present.     Mental Status: She is alert and oriented to person, place, and time.  Psychiatric:        Mood and Affect: Mood normal.        Behavior: Behavior normal.      UC Treatments / Results  Labs (all labs ordered are listed, but only abnormal results are displayed) Labs Reviewed - No data to display  EKG   Radiology DG Ankle Complete Right  Result Date: 06/17/2023 CLINICAL DATA:  Trauma, pain and swelling EXAM: RIGHT ANKLE - COMPLETE 3+ VIEW COMPARISON:  Right foot radiographs done on 10/24/2022 FINDINGS: No recent fracture or dislocation is seen. There is  soft tissue swelling. Plantar spur is seen in calcaneus. Smoothly marginated calcification in the dorsal aspect of navicular has not changed. 2 mm smooth marginated calcification is seen in the soft tissues in the plantar aspect of the calcaneus with no interval change. IMPRESSION: No recent fracture or dislocation is seen in right ankle. Electronically Signed   By: Ernie Avena M.D.   On: 06/17/2023 17:06    Procedures Procedures (including critical care time)  Medications Ordered in UC Medications - No data to display  Initial Impression / Assessment and Plan / UC Course  I have reviewed the triage vital signs and the nursing notes.  Pertinent labs & imaging results that were available during my care of the patient were reviewed by me and considered in my medical decision making (see chart for details).  The patient is well-appearing, she is in no acute distress, vital signs are stable.  X-ray is negative for fracture or dislocation.  Symptoms appear to be consistent with a contusion of the right ankle/foot given the mechanism of injury.  Patient was provided a lace up ankle brace to provide compression and support of the right ankle.  Supportive care recommendations were provided and discussed with the patient to  include RICE therapy, continuing over-the-counter analgesics for pain or discomfort, and gentle range of motion exercises.  Patient was advised if symptoms do not moving over the next 2 to 3 weeks, recommend following up with orthopedics.  Patient was given information for [facility] and for [facility] [place].  Patient is in agreement with this plan of care and verbalizes understanding.  All questions were answered.  Patient is stable for discharge.  Final Clinical Impressions(s) / UC Diagnoses   Final diagnoses:  Contusion of right foot, initial encounter  Contusion of right ankle, initial encounter     Discharge Instructions      X-ray is negative for fracture or dislocation.  It appears symptoms are consistent with a contusion of the right foot and ankle. Continue over-the-counter analgesics for pain or discomfort. RICE therapy.  Rest, ice, compression, and elevation.  Apply ice for 20 minutes, remove for 1 hour repeat as needed. Gentle range of motion exercises of the right foot and ankle to increase mobility of the joint. Try to ambulate on the foot as much as tolerated and as possible to decrease recovery time. If symptoms are not improving over the next 2 to 3 weeks, recommend following up with orthopedics for further evaluation.  You have been given information for [facility] and for [facility] [place]. Follow-up as needed.     ED Prescriptions   None    PDMP not reviewed this encounter.   Abran Cantor, NP 06/17/23 1728

## 2023-06-17 NOTE — ED Triage Notes (Signed)
A sound bar fell on right ankle on Saturday.  Has been using Voltaren gel for pain.  States ankle remains swollen and painful.

## 2023-06-19 ENCOUNTER — Ambulatory Visit: Payer: 59 | Admitting: Podiatry

## 2023-06-19 ENCOUNTER — Other Ambulatory Visit: Payer: Self-pay | Admitting: Allergy and Immunology

## 2023-06-19 DIAGNOSIS — M76821 Posterior tibial tendinitis, right leg: Secondary | ICD-10-CM

## 2023-06-19 DIAGNOSIS — J4522 Mild intermittent asthma with status asthmaticus: Secondary | ICD-10-CM

## 2023-06-19 DIAGNOSIS — S9031XA Contusion of right foot, initial encounter: Secondary | ICD-10-CM | POA: Diagnosis not present

## 2023-06-19 NOTE — Progress Notes (Signed)
She presents today concerned about her right ankle where on Saturday she was reaching over her head and dropped a surround sound bar on her medial ankle and foot.  She was seen at urgent care with radiographs taken which demonstrated no fracture.  She is currently in a Tri-Lock or ballerina brace.  She states that is quite tender to try to walk and is very hard to get around with that bulky brace in her shoes.  Objective: Vital signs are stable alert oriented x 3.  There is moderate edema moderate ecchymosis along the medial malleolus there is tenderness on palpation of the posterior tibial tendon and loss of inversion ability on that right foot.  All tendons appear to be intact but I think the posterior tibial tendon is contused.  Assessment: Contusion foot and ankle with tendinitis of the posterior tibial tendon.  Plan: At this point I recommended that she wear a cam boot which we dispensed today.  I recommended that she wear this out and wear her other brace in the house.  I like to follow-up with her in 4 weeks for another set of x-rays right ankle

## 2023-07-03 ENCOUNTER — Ambulatory Visit: Payer: 59

## 2023-07-08 ENCOUNTER — Ambulatory Visit: Payer: 59

## 2023-07-09 ENCOUNTER — Ambulatory Visit (INDEPENDENT_AMBULATORY_CARE_PROVIDER_SITE_OTHER): Payer: 59 | Admitting: *Deleted

## 2023-07-09 DIAGNOSIS — J4550 Severe persistent asthma, uncomplicated: Secondary | ICD-10-CM | POA: Diagnosis not present

## 2023-07-17 ENCOUNTER — Ambulatory Visit (INDEPENDENT_AMBULATORY_CARE_PROVIDER_SITE_OTHER): Payer: 59

## 2023-07-17 ENCOUNTER — Ambulatory Visit: Payer: 59 | Admitting: Podiatry

## 2023-07-17 ENCOUNTER — Encounter: Payer: Self-pay | Admitting: Podiatry

## 2023-07-17 DIAGNOSIS — S9001XD Contusion of right ankle, subsequent encounter: Secondary | ICD-10-CM

## 2023-07-17 NOTE — Progress Notes (Signed)
She presents today with her husband for follow-up of her right ankle.  States that the ankle is still so sore she can barely touch it she cannot lay in bed with it touching the other foot and she is having to walk with 2 braces on the Tri-Lock brace as well as the cam boot.  She is trying Biofreeze as well as diclofenac gel.  Objective: Vital signs are stable alert and oriented x 3 pulses are palpable.  There is no erythema mild edema overlying the medial malleolus extending to the deltoid area.  She has some tenderness on palpation of the posterior tibial tendon with majority of her pain is along the medial malleolus and the medial aspect of the tibia.  Radiographs taken today demonstrate osseously mature individual 3 views ankle demonstrate good bone mineralization normal ankle mortise no fractures identified no acute findings.   Assessment: Cannot rule out soft tissue injury or even bony edema.  Question deltoid integrity and posterior tibial tendon integrity.  Plan: Discussed etiology pathology conservative therapies at this point requesting an MRI conservative therapies have failed to render her asymptomatic after 6 weeks this is for differential diagnosis and surgical planning.

## 2023-07-18 ENCOUNTER — Ambulatory Visit (INDEPENDENT_AMBULATORY_CARE_PROVIDER_SITE_OTHER): Payer: 59

## 2023-07-18 DIAGNOSIS — J309 Allergic rhinitis, unspecified: Secondary | ICD-10-CM

## 2023-08-01 ENCOUNTER — Encounter: Payer: Self-pay | Admitting: Podiatry

## 2023-08-01 ENCOUNTER — Telehealth: Payer: Self-pay | Admitting: Podiatry

## 2023-08-01 NOTE — Telephone Encounter (Signed)
Patient had called in this morning because she was scheduled to have her right ankle MRI performed this Sunday.  The imaging facility called her to cancel it because they still hadn't gotten notice that it had been authorized by her insurance.  The patient called her insurance this morning and was informed the doctor notes had been requested in order to proceed with the review, but they weren't faxed in until this morning.  Patient expressed her frustration that her appointment with Dr. Al Corpus was two weeks ago and there is no excuse for why it had not been addressed with sending the notes earlier or getting authorized earlier.  I called her insurance company as well, spoke with Zella Ball R., to confirm they received the notes and that the review was in process.  She did confirm they received the medical records, but stated it could take up to 2 days to process.  I requested an expedited review since her MRI is tentatively scheduled for Sunday at 7am, but the insurance will not allow that request for scheduling reasons.  I called the patient back and gave her this information.  The case #[id number] to check on the status of the authorization request.  Zella Ball stated that the patient can call until 9pm today to see if it has been approved.  The patient noted she was told by the imaging facility that if she can't get the MRI on Sunday due to insurance auth. issues, then the next appointment they have available isn't until October.    She is frustrated and uncomfortable wearing the Camwalker at this time.  Notes she still has redness and swelling of the area.  She didn't know if Dr. Al Corpus had any other suggestions on how she can be more comfortable right now, or if she can come out of the boot yet.  She also has the TriLock/ballerina brace at home.  She figured if there was a stress fracture that he is suspecting, it probably has healed by now.  I did inform her that soft tissue / ligament injuries may take longer to  heal, and the MRI will also show any damage to the soft tissues as well as the bones.  I confirmed with her that I would let Dr. Al Corpus know what's been going on.    She asks if this Sunday's MRI ends up getting cancelled due to the insurance not authorizing it yet, that possibly Dr. Al Corpus could make a call and get her scheduled for another one asap once it has been approved.  Her callback [phone]

## 2023-08-03 ENCOUNTER — Other Ambulatory Visit: Payer: 59

## 2023-08-06 ENCOUNTER — Telehealth: Payer: Self-pay

## 2023-08-06 ENCOUNTER — Ambulatory Visit (INDEPENDENT_AMBULATORY_CARE_PROVIDER_SITE_OTHER): Payer: 59 | Admitting: *Deleted

## 2023-08-06 DIAGNOSIS — J4550 Severe persistent asthma, uncomplicated: Secondary | ICD-10-CM

## 2023-08-06 NOTE — Telephone Encounter (Signed)
Thanks will do. Thanks you for informing me

## 2023-08-06 NOTE — Telephone Encounter (Signed)
Insurance company sent fax regarding approval for MRI stating that they need results of xray. They require 6 weeks of provider direct treatment within the past 3 months without improved symptoms.

## 2023-08-07 ENCOUNTER — Telehealth: Payer: Self-pay | Admitting: Podiatry

## 2023-08-07 NOTE — Telephone Encounter (Signed)
MRI orders have been sent 9/13 and would like it sent resent with updated information. Pt would like nurse to call her to explain situation.

## 2023-08-07 NOTE — Telephone Encounter (Signed)
Pt called again stating she has been trying to get in touch with Dr Al Corpus with information about the referral. I did explain that Dr Al Corpus has been in clinic all day.

## 2023-08-12 ENCOUNTER — Ambulatory Visit (INDEPENDENT_AMBULATORY_CARE_PROVIDER_SITE_OTHER): Payer: Self-pay

## 2023-08-12 DIAGNOSIS — J309 Allergic rhinitis, unspecified: Secondary | ICD-10-CM

## 2023-08-16 NOTE — Progress Notes (Signed)
Seen by casting department

## 2023-08-24 ENCOUNTER — Other Ambulatory Visit: Payer: Self-pay | Admitting: Allergy and Immunology

## 2023-08-24 DIAGNOSIS — J4522 Mild intermittent asthma with status asthmaticus: Secondary | ICD-10-CM

## 2023-09-04 ENCOUNTER — Ambulatory Visit: Payer: 59 | Admitting: Podiatry

## 2023-09-04 ENCOUNTER — Ambulatory Visit (INDEPENDENT_AMBULATORY_CARE_PROVIDER_SITE_OTHER): Payer: 59 | Admitting: *Deleted

## 2023-09-04 ENCOUNTER — Telehealth: Payer: Self-pay | Admitting: Allergy and Immunology

## 2023-09-04 DIAGNOSIS — J4550 Severe persistent asthma, uncomplicated: Secondary | ICD-10-CM

## 2023-09-04 NOTE — Telephone Encounter (Signed)
Patient came in today to pay on her bill for her shots. I advised the patient that there is nothing showing from our department. Patient wants to know why her copay for her shot is not showing anything due.

## 2023-09-14 ENCOUNTER — Other Ambulatory Visit: Payer: Self-pay | Admitting: Allergy and Immunology

## 2023-09-15 ENCOUNTER — Other Ambulatory Visit: Payer: Self-pay | Admitting: Allergy and Immunology

## 2023-09-16 ENCOUNTER — Ambulatory Visit (INDEPENDENT_AMBULATORY_CARE_PROVIDER_SITE_OTHER): Payer: Self-pay | Admitting: *Deleted

## 2023-09-16 ENCOUNTER — Other Ambulatory Visit: Payer: Self-pay | Admitting: Allergy and Immunology

## 2023-09-16 DIAGNOSIS — J309 Allergic rhinitis, unspecified: Secondary | ICD-10-CM

## 2023-09-25 ENCOUNTER — Ambulatory Visit: Payer: 59 | Admitting: Podiatry

## 2023-09-26 ENCOUNTER — Telehealth: Payer: Self-pay | Admitting: Podiatry

## 2023-09-26 NOTE — Telephone Encounter (Signed)
Pt called and left message today at 1123 for me to call her back from the call from yesterday.  I returned call and left message for pt to call me back.

## 2023-09-30 ENCOUNTER — Ambulatory Visit (INDEPENDENT_AMBULATORY_CARE_PROVIDER_SITE_OTHER): Payer: 59 | Admitting: *Deleted

## 2023-09-30 DIAGNOSIS — J4550 Severe persistent asthma, uncomplicated: Secondary | ICD-10-CM

## 2023-10-01 ENCOUNTER — Ambulatory Visit: Payer: 59

## 2023-10-02 ENCOUNTER — Ambulatory Visit: Payer: 59 | Admitting: Podiatry

## 2023-10-07 ENCOUNTER — Other Ambulatory Visit: Payer: Self-pay | Admitting: Allergy and Immunology

## 2023-10-07 ENCOUNTER — Ambulatory Visit: Payer: 59 | Admitting: Allergy and Immunology

## 2023-10-10 ENCOUNTER — Other Ambulatory Visit: Payer: Self-pay | Admitting: Allergy and Immunology

## 2023-10-10 DIAGNOSIS — J4522 Mild intermittent asthma with status asthmaticus: Secondary | ICD-10-CM

## 2023-10-11 ENCOUNTER — Other Ambulatory Visit: Payer: Self-pay | Admitting: Allergy and Immunology

## 2023-10-21 ENCOUNTER — Ambulatory Visit: Payer: 59 | Admitting: Podiatry

## 2023-10-24 ENCOUNTER — Ambulatory Visit (INDEPENDENT_AMBULATORY_CARE_PROVIDER_SITE_OTHER): Payer: Self-pay | Admitting: *Deleted

## 2023-10-24 DIAGNOSIS — J309 Allergic rhinitis, unspecified: Secondary | ICD-10-CM | POA: Diagnosis not present

## 2023-10-28 ENCOUNTER — Ambulatory Visit (INDEPENDENT_AMBULATORY_CARE_PROVIDER_SITE_OTHER): Payer: 59

## 2023-10-28 DIAGNOSIS — J4550 Severe persistent asthma, uncomplicated: Secondary | ICD-10-CM

## 2023-10-31 ENCOUNTER — Other Ambulatory Visit: Payer: Self-pay | Admitting: Allergy and Immunology

## 2023-11-18 ENCOUNTER — Ambulatory Visit: Payer: 59 | Admitting: Allergy and Immunology

## 2023-11-22 ENCOUNTER — Other Ambulatory Visit: Payer: Self-pay | Admitting: Allergy and Immunology

## 2023-11-23 ENCOUNTER — Other Ambulatory Visit: Payer: Self-pay | Admitting: Allergy and Immunology

## 2023-11-23 DIAGNOSIS — J4522 Mild intermittent asthma with status asthmaticus: Secondary | ICD-10-CM

## 2023-11-25 ENCOUNTER — Ambulatory Visit (INDEPENDENT_AMBULATORY_CARE_PROVIDER_SITE_OTHER): Payer: 59 | Admitting: *Deleted

## 2023-11-25 DIAGNOSIS — J4550 Severe persistent asthma, uncomplicated: Secondary | ICD-10-CM

## 2023-11-29 ENCOUNTER — Other Ambulatory Visit: Payer: Self-pay | Admitting: Allergy and Immunology

## 2023-12-01 ENCOUNTER — Other Ambulatory Visit: Payer: Self-pay | Admitting: Allergy and Immunology

## 2023-12-05 ENCOUNTER — Ambulatory Visit (INDEPENDENT_AMBULATORY_CARE_PROVIDER_SITE_OTHER): Payer: Self-pay

## 2023-12-05 DIAGNOSIS — J309 Allergic rhinitis, unspecified: Secondary | ICD-10-CM

## 2023-12-08 ENCOUNTER — Encounter: Payer: Self-pay | Admitting: Hematology

## 2023-12-18 ENCOUNTER — Encounter: Payer: Self-pay | Admitting: Podiatry

## 2023-12-18 ENCOUNTER — Ambulatory Visit: Payer: 59 | Admitting: Podiatry

## 2023-12-18 ENCOUNTER — Ambulatory Visit (INDEPENDENT_AMBULATORY_CARE_PROVIDER_SITE_OTHER): Payer: 59

## 2023-12-18 DIAGNOSIS — M722 Plantar fascial fibromatosis: Secondary | ICD-10-CM

## 2023-12-18 DIAGNOSIS — S93692A Other sprain of left foot, initial encounter: Secondary | ICD-10-CM

## 2023-12-18 MED ORDER — TRIAMCINOLONE ACETONIDE 40 MG/ML IJ SUSP
20.0000 mg | Freq: Once | INTRAMUSCULAR | Status: AC
  Administered 2023-12-18: 20 mg

## 2023-12-18 NOTE — Progress Notes (Signed)
She presents today and states that both of her feet are just killing her she says they are burning and hurting on the bottoms even when she gets up in the mornings standing on the carpet they are painful she states that sometimes they feel little bit better but she feels like she is walking on glass the majority of the time.  She has tried anti-inflammatories.  She also think she may be starting to develop a small bunion the medial aspect of the first metatarsal phalangeal joint.  She states that she has been wearing her orthotics utilizing her anti-inflammatories ice therapy good tennis shoes all the time all to no avail.  She states that the right foot was hurting the most and now the left foot is started to hurt the most.  Objective: Vital signs are stable she is alert and oriented x 3.  Pulses are palpable.  She has severe pain on palpation medial calcaneal tubercles bilateral I am unable to palpate the medial band of the plantar fascia along the left foot.  The right foot unable to palpate though she has moderate to severe pain of similar character caliber bilaterally.  Radiographically she does demonstrate a soft tissue increase in density plantar fascial cannula insertion site left appears to be worse than the right but the right does demonstrate what appears to be an old avulsion fracture or calcification within side the ligament itself.  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  Plan: Discussed etiology pathology conservative surgical therapies at this point I injected her right heel for her today due to not being able to palpate that medial band of the suspect a tear of the plantar fascia in that left heel.  She had a plantar fascial brace at home she has plantar fascial orthotics tennis shoes anti-inflammatories.  We are going to put her on nonsteroidal anti-inflammatories.  Will request an MRI of the  left foot to evaluate for tear and surgical correction.  We have been treating her for plantar fasciitis for many years and I think it is finally come to ahead and is becoming more problematic.  Plan:

## 2023-12-23 ENCOUNTER — Ambulatory Visit (INDEPENDENT_AMBULATORY_CARE_PROVIDER_SITE_OTHER): Payer: 59

## 2023-12-23 DIAGNOSIS — J4550 Severe persistent asthma, uncomplicated: Secondary | ICD-10-CM | POA: Diagnosis not present

## 2023-12-24 ENCOUNTER — Encounter: Payer: Self-pay | Admitting: Podiatry

## 2023-12-30 ENCOUNTER — Ambulatory Visit: Payer: 59 | Admitting: Allergy and Immunology

## 2023-12-31 ENCOUNTER — Encounter: Payer: Self-pay | Admitting: Podiatry

## 2024-01-01 ENCOUNTER — Encounter: Payer: Self-pay | Admitting: Podiatry

## 2024-01-02 ENCOUNTER — Encounter: Payer: Self-pay | Admitting: Podiatry

## 2024-01-05 ENCOUNTER — Encounter: Payer: Self-pay | Admitting: Podiatry

## 2024-01-05 ENCOUNTER — Telehealth: Payer: Self-pay | Admitting: Podiatry

## 2024-01-05 NOTE — Telephone Encounter (Signed)
Aleshia from [employer] needs a call back regarding this patient's authorization for MRI has been denied.  Her number is [phone]

## 2024-01-05 NOTE — Telephone Encounter (Addendum)
Pt called to check on MRI order, wanted to make sure notes are submitted correctly to make sure orders are authorized by insurance. Aleshia from [employer] is [id number] Pt states notes were given to Dr. Al Corpus for the MRI orders, also states a "peer-to-peer" is needed to make sure claims aren't denied for [facility].

## 2024-01-06 NOTE — Telephone Encounter (Signed)
Patient's MRI was denied due to the fact that patient has not completed a full six weeks of treatment that must have been completed within the past three months without improved symptoms.    A peer to peer can be done by calling [phone] and using Reference #[id number]

## 2024-01-07 ENCOUNTER — Other Ambulatory Visit: Payer: 59

## 2024-01-07 ENCOUNTER — Other Ambulatory Visit: Payer: Self-pay | Admitting: Allergy and Immunology

## 2024-01-07 NOTE — Telephone Encounter (Signed)
Per Dr. Al Corpus -instead of doing peer to peer for MRI, he wants her to be scheduled for an appointment to begin and document 6 weeks of treatment -please call to schedule -thanks

## 2024-01-08 ENCOUNTER — Other Ambulatory Visit: Payer: 59

## 2024-01-12 ENCOUNTER — Telehealth: Payer: Self-pay | Admitting: Podiatry

## 2024-01-12 NOTE — Telephone Encounter (Signed)
 Patient returned my call I explained to her per Dr Al Corpus he wants her to come in for 6 weeks of tx patient states that [employer] needs correct codes and notes in for her to get this MRI. Patient says she will do the treatment but what are you treating if she can't get the MRI done. Patient really wants the MRI done. Patient would like a call back.

## 2024-01-12 NOTE — Telephone Encounter (Signed)
 Patient called back and scheduled an appt for next week with Dr Al Corpus

## 2024-01-14 ENCOUNTER — Encounter: Payer: Self-pay | Admitting: Podiatry

## 2024-01-20 ENCOUNTER — Ambulatory Visit: Payer: 59

## 2024-01-20 ENCOUNTER — Ambulatory Visit: Payer: 59 | Admitting: Podiatry

## 2024-01-20 DIAGNOSIS — M722 Plantar fascial fibromatosis: Secondary | ICD-10-CM | POA: Diagnosis not present

## 2024-01-20 MED ORDER — TRIAMCINOLONE ACETONIDE 40 MG/ML IJ SUSP
40.0000 mg | Freq: Once | INTRAMUSCULAR | Status: AC
  Administered 2024-01-20: 40 mg

## 2024-01-20 MED ORDER — MELOXICAM 15 MG PO TABS: 15.0000 mg | ORAL_TABLET | Freq: Every day | ORAL | 3 refills | Status: DC

## 2024-01-21 ENCOUNTER — Other Ambulatory Visit: Payer: Self-pay

## 2024-01-21 ENCOUNTER — Telehealth: Payer: Self-pay | Admitting: Podiatry

## 2024-01-21 MED ORDER — MELOXICAM 7.5 MG PO TABS
7.5000 mg | ORAL_TABLET | Freq: Every day | ORAL | 0 refills | Status: DC
Start: 2024-01-21 — End: 2024-02-23

## 2024-01-21 NOTE — Progress Notes (Signed)
 Jennifer Mullins presents today for follow-up of her bilateral foot pain.  She states that she is still in so much pain and can hardly walk.  She feels that the right foot is hurting on the dorsal lateral aspect as well as the dorsal medial aspect from where the sound bar fell on her foot last year.  But she states that by far the plantar fasciitis is hurting more than anything.  She states that it did feel better for short time after the injection last visit.  She continues anti-inflammatories at home nonprescription.  She continues physical therapy to this right foot at home which she was provided information for at her last visit.  She states that her left foot is primarily plantar fasciitis as she points to the medial aspect of the heel and states that she is starting to develop pain along the lateral side of the heel and lateral foot.  She states that the nonsteroidals have failed to alleviate her symptoms in her left foot.  In physical therapy does not seem to be improving this.  When asked, she states that she continues to wear shoes in the house at all times.  Objective: Vital signs are stable alert and oriented x 3.  Right ankle does demonstrate some mild swelling lateral aspect.  Pulses are palpable neurologic sensorium is intact.  She has severe pain on palpation medial calcaneal tubercle of the right foot.  It is warm to the touch but does not appear to be cellulitic and that there is no erythema.  Some tenderness on palpation of the posterior tibial and peroneal tendons right.  Left foot demonstrates similar findings no swelling but significant pain on palpation of the medial and central bands of the plantar fascia left.  She has some tenderness on palpation of the sinus tarsi left and along the lateral aspect of the fifth metatarsal area left.  Some tenderness on palpation of the posterior tibial tendon left.   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  She will continue her at home physical therapy.  I did write a prescription for meloxicam 15 mg 1 p.o. daily.  I reinjected the bilateral heels today.  And recommended shoe gear changes which she may benefit from.  I will follow-up with her in 3 to 4 weeks for reevaluation.

## 2024-01-21 NOTE — Telephone Encounter (Signed)
 Patient called stating Dr Al Corpus sent over RX of Meloxicam to pharmacy 15mg  per pt she is on Eliquist so she wants to know if she can have a lower dose or something else.

## 2024-01-23 ENCOUNTER — Ambulatory Visit (INDEPENDENT_AMBULATORY_CARE_PROVIDER_SITE_OTHER): Admitting: *Deleted

## 2024-01-23 ENCOUNTER — Telehealth: Payer: Self-pay | Admitting: Podiatry

## 2024-01-23 DIAGNOSIS — J4550 Severe persistent asthma, uncomplicated: Secondary | ICD-10-CM | POA: Diagnosis not present

## 2024-01-23 NOTE — Telephone Encounter (Signed)
 Patient called this am stating she picked up the meloxicam RX yesterday and took it. Pt states she had cold chills all night long and she read all the side effects from this medication and she doesn't think she wants to be on it.  Patient thought there was a much lower dose than the 7.5mg . Patient wants to know if she should stop taking the medication and is there something else that she can take?

## 2024-02-05 ENCOUNTER — Telehealth: Payer: Self-pay | Admitting: Allergy and Immunology

## 2024-02-05 ENCOUNTER — Telehealth: Payer: Self-pay

## 2024-02-05 NOTE — Telephone Encounter (Signed)
 Patient called stating she needs a prior authorization for the medication Protonix in order to get a refill on it.

## 2024-02-05 NOTE — Telephone Encounter (Signed)
*  Asthma/Allergy  Pharmacy Patient Advocate Encounter  Received notification from [facility] that Prior Authorization for Pantoprazole Sodium 40MG  dr tablets  has been APPROVED from 02/05/24 to 02/04/2025   PA #/Case ID/Reference #: Vic Ripper

## 2024-02-09 ENCOUNTER — Ambulatory Visit (INDEPENDENT_AMBULATORY_CARE_PROVIDER_SITE_OTHER): Payer: Self-pay | Admitting: *Deleted

## 2024-02-09 ENCOUNTER — Other Ambulatory Visit: Payer: Self-pay | Admitting: *Deleted

## 2024-02-09 DIAGNOSIS — J309 Allergic rhinitis, unspecified: Secondary | ICD-10-CM

## 2024-02-09 MED ORDER — PANTOPRAZOLE SODIUM 40 MG PO TBEC: 40.0000 mg | DELAYED_RELEASE_TABLET | Freq: Two times a day (BID) | ORAL | 1 refills | Status: DC

## 2024-02-09 MED ORDER — EPINEPHRINE 0.3 MG/0.3ML IJ SOAJ: 0.3000 mg | INTRAMUSCULAR | 1 refills | Status: AC | PRN

## 2024-02-09 NOTE — Telephone Encounter (Signed)
 Patient came in for her allergy injections and asked for a refill of her Epipen and Pantoprazole. Refills have been sent in. The prior authorization for Pantoprazole has been approved and faxed to the pharmacy as well. Called patient and informed. Patient verbalized understanding.

## 2024-02-12 ENCOUNTER — Ambulatory Visit: Admitting: Podiatry

## 2024-02-12 DIAGNOSIS — M722 Plantar fascial fibromatosis: Secondary | ICD-10-CM

## 2024-02-12 MED ORDER — TRIAMCINOLONE ACETONIDE 40 MG/ML IJ SUSP
20.0000 mg | Freq: Once | INTRAMUSCULAR | Status: AC
  Administered 2024-02-12: 20 mg

## 2024-02-12 NOTE — Progress Notes (Signed)
 She presents today for follow-up of her Planter fasciitis of her bilateral feet.  She states that after the 2 shots in the right foot it is tolerable at this point but is still exquisitely tender she finds herself favoring left foot with tenderness along the medial aspect of the ankle and that she states that she is even getting soreness in that first metatarsal phalangeal joint.  Left foot is exquisitely painful.  She was unable to take her anti-inflammatory due to her Eliquis.  Objective: Vital signs are stable alert oriented x 3 still has exquisite pain on palpation medial calcaneal tubercle of the left foot no pain on medial-lateral compression of the calcaneus however she still has tenderness on palpation of the plantar fascial calcaneal insertion site medial and central tubercle of the right foot with tenderness on palpation of the posterior tibial tendon.  Assessment: Concerning for posterior tibial tendinitis and Planter fasciitis right and cannot rule out tearing of the plantar fascia on the right and the left.  Chronic intractable Planter fasciitis left as well.  Plan: Injected the left heel today 20 mg Kenalog 5 mg Marcaine and will follow-up with her in just about 1 month.

## 2024-02-17 ENCOUNTER — Ambulatory Visit (INDEPENDENT_AMBULATORY_CARE_PROVIDER_SITE_OTHER): Payer: Self-pay | Admitting: *Deleted

## 2024-02-17 DIAGNOSIS — J309 Allergic rhinitis, unspecified: Secondary | ICD-10-CM | POA: Diagnosis not present

## 2024-02-22 ENCOUNTER — Other Ambulatory Visit: Payer: Self-pay | Admitting: Podiatry

## 2024-02-23 ENCOUNTER — Ambulatory Visit (INDEPENDENT_AMBULATORY_CARE_PROVIDER_SITE_OTHER)

## 2024-02-23 DIAGNOSIS — J4550 Severe persistent asthma, uncomplicated: Secondary | ICD-10-CM

## 2024-02-25 ENCOUNTER — Other Ambulatory Visit: Payer: Self-pay | Admitting: Allergy and Immunology

## 2024-03-01 ENCOUNTER — Other Ambulatory Visit: Payer: Self-pay | Admitting: Allergy and Immunology

## 2024-03-09 ENCOUNTER — Encounter: Payer: Self-pay | Admitting: Allergy and Immunology

## 2024-03-09 ENCOUNTER — Ambulatory Visit: Payer: 59 | Admitting: Allergy and Immunology

## 2024-03-09 ENCOUNTER — Other Ambulatory Visit: Payer: Self-pay

## 2024-03-09 VITALS — BP 112/70 | HR 72 | Temp 97.9°F | Resp 16 | Ht 61.5 in | Wt 126.8 lb

## 2024-03-09 DIAGNOSIS — H1013 Acute atopic conjunctivitis, bilateral: Secondary | ICD-10-CM

## 2024-03-09 DIAGNOSIS — J4550 Severe persistent asthma, uncomplicated: Secondary | ICD-10-CM | POA: Diagnosis not present

## 2024-03-09 DIAGNOSIS — J3089 Other allergic rhinitis: Secondary | ICD-10-CM | POA: Diagnosis not present

## 2024-03-09 DIAGNOSIS — K219 Gastro-esophageal reflux disease without esophagitis: Secondary | ICD-10-CM

## 2024-03-09 DIAGNOSIS — J301 Allergic rhinitis due to pollen: Secondary | ICD-10-CM | POA: Diagnosis not present

## 2024-03-09 DIAGNOSIS — H1010 Acute atopic conjunctivitis, unspecified eye: Secondary | ICD-10-CM

## 2024-03-09 DIAGNOSIS — H1044 Vernal conjunctivitis: Secondary | ICD-10-CM

## 2024-03-09 DIAGNOSIS — G500 Trigeminal neuralgia: Secondary | ICD-10-CM

## 2024-03-09 DIAGNOSIS — J309 Allergic rhinitis, unspecified: Secondary | ICD-10-CM

## 2024-03-09 MED ORDER — BUDESONIDE-FORMOTEROL FUMARATE 80-4.5 MCG/ACT IN AERO: 2.0000 | INHALATION_SPRAY | Freq: Two times a day (BID) | RESPIRATORY_TRACT | 5 refills | Status: DC | PRN

## 2024-03-09 MED ORDER — AUVI-Q 0.3 MG/0.3ML IJ SOAJ: INTRAMUSCULAR | 3 refills | Status: AC

## 2024-03-09 MED ORDER — CETIRIZINE HCL 10 MG PO TABS: 10.0000 mg | ORAL_TABLET | Freq: Two times a day (BID) | ORAL | 1 refills | Status: DC | PRN

## 2024-03-09 MED ORDER — BUDESONIDE 32 MCG/ACT NA SUSP: NASAL | 5 refills | Status: AC

## 2024-03-09 NOTE — Progress Notes (Signed)
 [place] - [place] - [place] - [place] - [place]   Follow-up Note  Referring Provider: Lenn Quint, NP Primary Provider: Lenn Quint, NP Date of Office Visit: 03/09/2024  Subjective:   BEN SANZ (DOB: 14-Mar-1971) is a 53 y.o. female who [facility] in re-evaluation of the following:  HPI: Lakeithia returns to this clinic in evaluation of asthma, allergic rhinoconjunctivitis, history of vernal conjunctivitis, history of facial pain syndrome, dry eye syndrome, and LPR.  I last saw her in this clinic 01 Apr 2023.  For the most part her asthma has been doing relatively well while she uses anti-IL-5 biologic agent and a controller inhaler.  Her controller inhaler at this point in time is Symbicort  but she only utilizes this agent about 1 time per day.  Her requirement for short acting bronchodilator is less than once a week.  She has not required a systemic steroid to treat an exacerbation.  Her nose is doing okay at this point in time while using a nasal steroid.  She has not required an antibiotic treat an episode of sinusitis.  She continues to use immunotherapy currently at every 4 weeks frozen at 0.25 mL.  Her reflux is under very good control with pantoprazole .  Her chronic headache and facial pain syndrome is under very good control with intermittent use of Periactin .  She continues to have her eye issue which is a combination of glaucoma and dry eye and vernal conjunctivitis addressed by ophthalmology.  She is applying to SSDI and would like a letter from us  noting her disabilities.  Allergies as of 03/09/2024       Reactions   Demerol [meperidine] Shortness Of Breath   FLUSHING AND SHORTNESS OF BREATH   Morphine  And Codeine  Shortness Of Breath   Flushed and hot hyper Flushed and hot hyper Flushed and hot hyper Flushed and hot hyper   Other Shortness Of Breath   Flushed and hot hyper Flushed and hot  hyper Flushed and hot hyper Flushed and hot hyper Flushed and hot hyper   Cardizem  [diltiazem Hcl] Other (See Comments)   Diltiazem Hcl Hives   Gabapentin Other (See Comments)   Chest pain and elevated heart rate   Lisinopril     Caused cough   Lyrica [pregabalin]    Chest pain, elevated heart rate   Tramadol  Other (See Comments)   Tears up stomach        Medication List    Airsupra  90-80 MCG/ACT Aero Generic drug: Albuterol -Budesonide  TAKE 2 PUFFS BY MOUTH EVERY 4 HOURS AS NEEDED   albuterol  108 (90 Base) MCG/ACT inhaler Commonly known as: VENTOLIN  HFA INHALE 2 PUFFS INTO THE LUNGS EVERY 6 HOURS AS NEEDED FOR WHEEZING OR SHORT OF BREATH   ALPRAZolam  0.5 MG tablet Commonly known as: XANAX  TAKE 1 TABLET BY MOUTH TWICE DAILY AS NEEDED. MAY TAKE ONE MORE TABLET BEFORE FOR ALLERGY SHOTS ONCE A WEEK (30 DAY supply)   aspirin  81 MG chewable tablet Chew 81 mg by mouth daily. Take 2 tablets for a total of 162 mg daily   Auvi-Q  0.3 MG/0.3ML Soaj injection Generic drug: EPINEPHrine  Use for life threatening allergic reactions   EPINEPHrine  0.3 mg/0.3 mL Soaj injection Commonly known as: EPI-PEN Inject 0.3 mg into the muscle as needed for anaphylaxis.   azelastine  0.05 % ophthalmic solution Commonly known as: OPTIVAR  Apply 1 drop to eye 2 (two) times daily.   brimonidine-timolol 0.2-0.5 % ophthalmic solution  Commonly known as: COMBIGAN Apply 1 drop to eye 2 (two) times daily.   budesonide  32 MCG/ACT nasal spray Commonly known as: [facility] Budesonide  USE 1-2 SPRAYS INTO BOTH NOSTRILS EVERY DAY   budesonide -formoterol  80-4.5 MCG/ACT inhaler Commonly known as: SYMBICORT  INHALE 2 PUFFS INTO THE LUNGS TWICE A DAY   cetirizine  10 MG tablet Commonly known as: ZYRTEC  Take 1 tablet (10 mg total) by mouth 2 (two) times daily.   cromolyn  4 % ophthalmic solution Commonly known as: OPTICROM  Place 1 drop into both eyes 4 (four) times daily.   cycloSPORINE  0.05 % ophthalmic  emulsion Commonly known as: RESTASIS  Place 1 drop into both eyes 2 (two) times daily.   cyproheptadine  4 MG tablet Commonly known as: PERIACTIN  TAKE 1/2 TO 1 TABLET BY MOUTH AT BEDTIME   diclofenac  0.1 % ophthalmic solution Commonly known as: VOLTAREN  1 drop 2 (two) times daily.   diclofenac  Sodium 1 % Gel Commonly known as: VOLTAREN  Apply 2 g topically 4 (four) times daily.   Diethylpropion  HCl CR 75 MG Tb24 Take 1 tablet (75 mg total) by mouth daily.   dorzolamide-timolol 2-0.5 % ophthalmic solution Commonly known as: COSOPT Place 1 drop into both eyes 2 (two) times daily.   Eliquis  2.5 MG Tabs tablet Generic drug: apixaban  Take 2.5 mg by mouth 2 (two) times daily.   HYDROcodone -acetaminophen  7.5-325 MG tablet Commonly known as: NORCO Take 1 tablet by mouth every 6 (six) hours as needed for moderate pain.   losartan  25 MG tablet Commonly known as: COZAAR  Take 1 tablet (25 mg total) by mouth daily.   lubiprostone 8 MCG capsule Commonly known as: AMITIZA SMARTSIG:1 Capsule(s) By Mouth 1-4 Times Daily   megestrol  40 MG tablet Commonly known as: MEGACE  TAKE 3 TABLETS DAILY BY MOUTH FOR 5 DAYS, 2 TABLETS DAILY FOR 5 DAYS, THEN 1 TABLET DAILY   meloxicam  7.5 MG tablet Commonly known as: MOBIC  TAKE 1 TABLET BY MOUTH EVERY DAY   metFORMIN  500 MG tablet Commonly known as: GLUCOPHAGE  TAKE 1 TABLET BY MOUTH EVERY DAY WITH BREAKFAST   montelukast  10 MG tablet Commonly known as: SINGULAIR  TAKE 1 TABLET BY MOUTH EVERYDAY AT BEDTIME   Nucala  100 MG/ML Soaj Generic drug: Mepolizumab  Inject 1 mL (100 mg total) into the skin every 28 (twenty-eight) days.   pantoprazole  40 MG tablet Commonly known as: PROTONIX  Take 1 tablet (40 mg total) by mouth 2 (two) times daily.   pravastatin  40 MG tablet Commonly known as: PRAVACHOL  Take 1 tablet (40 mg total) by mouth daily. What changed: when to take this   progesterone  200 MG capsule Commonly known as: PROMETRIUM  Take 1  capsule (200 mg total) by mouth at bedtime.   spironolactone  50 MG tablet Commonly known as: ALDACTONE  TAKE 1 TABLET BY MOUTH TWICE DAILY   spironolactone  25 MG tablet Commonly known as: ALDACTONE  Take 25 mg by mouth 2 (two) times daily.   terconazole  0.4 % vaginal cream Commonly known as: TERAZOL 7  Place 1 applicator vaginally at bedtime.   UNABLE TO FIND Allergy shots-both arms-weekly   Vaniqa  13.9 % cream Generic drug: Eflornithine HCl APPLY TO AFFECTED AREA TWICE DAILY    Past Medical History:  Diagnosis Date   Anxiety    Arm vein blood clot, left    Arthritis    Asthma    Bilateral calcaneal spurs    Bilateral polycystic ovarian syndrome    Bulging lumbar disc    Carpal tunnel syndrome, bilateral    COPD (chronic obstructive  pulmonary disease) (HCC)    BRONCHITIS   Diabetes mellitus without complication (HCC)    TYPE 2   DX  4-5 YRS AGO   ETD (Eustachian tube dysfunction), bilateral    left worse than right   Fibromyalgia    GAD (generalized anxiety disorder)    GERD (gastroesophageal reflux disease)    TAKES PRESCRIPTION MEDS   Glaucoma    BOTH EYES   Hyperlipidemia    Hypertension    Hypothyroidism    NODULES ON THYROID    PONV (postoperative nausea and vomiting)    Pulmonary embolism (HCC)    Tachycardia    TMJ (dislocation of temporomandibular joint)    Trigger finger    Trigger finger of left thumb     Past Surgical History:  Procedure Laterality Date   ANTERIOR CERVICAL DECOMP/DISCECTOMY FUSION N/A 11/13/2016   Procedure: Cervical five-six, Cervical six-seven Anterior Cervical Discectomy and Fusion, Allograft, Plate;  Surgeon: Adah Acron, MD;  Location: [facility] OR;  Service: Orthopedics;  Laterality: N/A;   BREAST SURGERY     CARPAL TUNNEL RELEASE     DILATION AND CURETTAGE OF UTERUS     EXPLORATORY LAPAROTOMY     LIPOMA EXCISION Left 06/15/2018   Procedure: LEFT CALF LIPOMA EXCISION;  Surgeon: Adah Acron, MD;  Location: [facility];  Service: Orthopedics;  Laterality: Left;   NASAL SINUS SURGERY  07/22/2013   Dr. Elvan Hamel in [place]   plate and six screws in back     REDUCTION MAMMAPLASTY Bilateral 2000   TRIGGER FINGER RELEASE Right 06/15/2018   Procedure: RIGHT TRIGGER THUMB RELEASE;  Surgeon: Adah Acron, MD;  Location: [facility];  Service: Orthopedics;  Laterality: Right;    Review of systems negative except as noted in HPI / PMHx or noted below:  Review of Systems  Constitutional: Negative.   HENT: Negative.    Eyes: Negative.   Respiratory: Negative.    Cardiovascular: Negative.   Gastrointestinal: Negative.   Genitourinary: Negative.   Musculoskeletal: Negative.   Skin: Negative.   Neurological: Negative.   Endo/Heme/Allergies: Negative.   Psychiatric/Behavioral: Negative.       Objective:   Vitals:   03/09/24 1535  BP: 112/70  Pulse: 72  Resp: 16  Temp: 97.9 F (36.6 C)  SpO2: 99%   Height: 5' 1.5" (156.2 cm)  Weight: 126 lb 12.8 oz (57.5 kg)   Physical Exam Constitutional:      Appearance: She is not diaphoretic.  HENT:     Head: Normocephalic.     Right Ear: Tympanic membrane, ear canal and external ear normal.     Left Ear: Tympanic membrane, ear canal and external ear normal.     Nose: Nose normal. No mucosal edema or rhinorrhea.     Mouth/Throat:     Pharynx: Uvula midline. No oropharyngeal exudate.  Eyes:     Conjunctiva/sclera: Conjunctivae normal.  Neck:     Thyroid : No thyromegaly.     Trachea: Trachea normal. No tracheal tenderness or tracheal deviation.  Cardiovascular:     Rate and Rhythm: Normal rate and regular rhythm.     Heart sounds: Normal heart sounds, S1 normal and S2 normal. No murmur heard. Pulmonary:     Effort: No respiratory distress.     Breath sounds: Normal breath sounds. No stridor. No wheezing or rales.  Lymphadenopathy:     Head:     Right side of head: No tonsillar adenopathy.     Left  side of head: No tonsillar  adenopathy.     Cervical: No cervical adenopathy.  Skin:    Findings: No erythema or rash.     Nails: There is no clubbing.  Neurological:     Mental Status: She is alert.     Diagnostics:    Spirometry was performed and demonstrated an FEV1 of 1.85 at 87 % of predicted.  The patient had an Asthma Control Test with the following results: ACT Total Score: 21.    Assessment and Plan:   1. Asthma, severe persistent, well-controlled   2. Perennial allergic rhinitis   3. Seasonal allergic rhinitis due to pollen   4. Seasonal allergic conjunctivitis   5. Vernal conjunctivitis of both eyes   6. Facial pain syndrome   7. LPRD (laryngopharyngeal reflux disease)   8. Allergic rhinitis, unspecified seasonality, unspecified trigger     1. Continue Immunotherapy at maintenance 0.25 ml Red vial .     2. Continue to Treat and prevent inflammation:   A. OTC Budesonide / Rhinocort  - 1-2 sprays each nostril daily  B. Symbicort  80 - 2 inhalations 1-2 times per day  C. montelukast  10 mg - one tablet one time per day  D. Mepolizumab  injections every 4 weeks  3. Continue to Treat and prevent reflux:   A.  Pantoprazole  40 mg twice a day  4. Treat and prevent facial pain syndrome:   A. Periactin  4 mg - 1/2 - 1 tablet at bedtime  5. If needed:   A. OTC antihistamine - Zyrtec  10mg  1-2 times per day.   B. Symbicort  80 - 2 inhalations every 6 hours (replaces albuterol )  C. Epi-Pen / Auvi-Q   D. Eye treatment directed by eye doctor  6. Use a extra dose of Zyrtec  1 hour before immunotherapy injection  7. Return to clinic in 6 months or earlier if problem  8. Letter for SSDI  Madalaine appears to be doing okay at this point in time on a very large collection of medical therapy directed against respiratory tract inflammation, conjunctival inflammation, reflux, and chronic headaches/chronic facial pain syndrome.  She will remain on this plan and we will see her back in this clinic in 6 months or  earlier if there is a problem.  We will get her a letter for SSDI.   Schuyler Custard, MD [facility]ergy / [facility]ology  [facility]

## 2024-03-09 NOTE — Patient Instructions (Addendum)
  1. Continue Immunotherapy at maintenance 0.25 ml Red vial .     2. Continue to Treat and prevent inflammation:   A. OTC Budesonide / Rhinocort  - 1-2 sprays each nostril daily  B. Symbicort  80 - 2 inhalations 1-2 times per day  C. montelukast  10 mg - one tablet one time per day  D. Mepolizumab  injections every 4 weeks  3. Continue to Treat and prevent reflux:   A.  Pantoprazole  40 mg twice a day  4. Treat and prevent facial pain syndrome:   A. Periactin  4 mg - 1/2 - 1 tablet at bedtime  5. If needed:   A. OTC antihistamine - Zyrtec  10mg  1-2 times per day.   B. Symbicort  80 - 2 inhalations every 6 hours (replaces albuterol )  C. Epi-Pen / Auvi-Q   D. Eye treatment directed by eye doctor  6. Use a extra dose of Zyrtec  1 hour before immunotherapy injection  7. Return to clinic in 6 months or earlier if problem  8. Letter for SSDI

## 2024-03-10 ENCOUNTER — Telehealth: Payer: Self-pay

## 2024-03-10 ENCOUNTER — Encounter: Payer: Self-pay | Admitting: Allergy and Immunology

## 2024-03-10 NOTE — Telephone Encounter (Signed)
 Left message for patient to call the office.  Please let her know that Dr. Jerelene Monday finished the letter she requested.  Would she like us  to mail it to her?  Please advise.

## 2024-03-11 ENCOUNTER — Ambulatory Visit: Admitting: Podiatry

## 2024-03-15 DIAGNOSIS — J3081 Allergic rhinitis due to animal (cat) (dog) hair and dander: Secondary | ICD-10-CM | POA: Diagnosis not present

## 2024-03-15 NOTE — Progress Notes (Signed)
 VIALS MADE 03-15-24. EXP 03-15-25

## 2024-03-16 DIAGNOSIS — J3089 Other allergic rhinitis: Secondary | ICD-10-CM | POA: Diagnosis not present

## 2024-03-18 ENCOUNTER — Encounter: Payer: Self-pay | Admitting: Podiatry

## 2024-03-18 ENCOUNTER — Ambulatory Visit: Admitting: Podiatry

## 2024-03-18 DIAGNOSIS — M722 Plantar fascial fibromatosis: Secondary | ICD-10-CM | POA: Diagnosis not present

## 2024-03-18 NOTE — Telephone Encounter (Signed)
 Patient came in to pick up the letter. She is requesting that Dr. Kozlow include in the letter the reasons she is on the Nucala . Per Jennifer Mullins that would include left pulmonary embolism and left arm DVT. Please advise.

## 2024-03-18 NOTE — Progress Notes (Signed)
 Jennifer Mullins presents today stating that her feet are still killing her.  She is complaining of pain to the medial malleolus with radiating pain from where she dropped the sound bar from a TV a couple of years ago.  She states that it really has not gotten any better and is always so sensitive particularly with socks and shoe gear and in bed.  She goes on to say that her heels are still very painful and even the outside of the ankles are painful as well.  States that the injections really do not seem to be helping and seem to be becoming more painful quickly after each set of injections and oral medication.  She has tried physical therapy at home to no avail.  She has tried ice different shoe gear orthotics.  Objective: Vital signs are stable alert and oriented x 3.  She has severe allodynic type symptoms overlying the medial malleolus with radiating pain upon palpation.  She has severe pain on palpation medial calcaneal tubercles bilateral and even along the peroneal tendon sheaths with eversion against resistance and abduction against resistance.  She continues to have pain over an old nonunion site of the tarsometatarsal joints of the right foot.  Assessment: Plantar fascial rupture bilateral.  Peroneal tendon tears most likely bilateral.  Contusion ankle right x 2 years.  Nonunion right foot midfoot.  Plan: Discussed etiology pathology conservative versus surgical therapies at this point we are requesting MRI bilateral foot and ankle.  I feel this is unnecessary since all conservative therapies over the past 12 weeks have failed to render her asymptomatic we have tried at home physical therapy with formal description provided.  We have tried multiple sets of injections and steroid and anti-inflammatories icing booting all to no avail.  We have tried topical anti-inflammatories due to her being on Eliquis .  This is a affecting her ability to perform her daily activities and maintain her general good health as she  needs to continue to walk and exercise.  We will await findings of the MRI and then have her back in the office to discuss options.  In the meantime she will continue all conservative therapies.    Contact name at [facility] imaging is Latina Pol and she is familiar with [name]'s case and may be of assistance with scheduling and coding for MRIs bilateral.

## 2024-03-18 NOTE — Telephone Encounter (Signed)
 Left a message informing patient of Dr. Zenia Hight note. Informed patient that letter will be up front ready for pick up.

## 2024-03-18 NOTE — Telephone Encounter (Signed)
 Patient called back, I informed her of Dr. Zenia Hight note and she verbalized understanding. She is wondering if Dr. Jerelene Monday will include in the letter that she has a past history of left pulmonary embolism and left arm DVT. Please Advise.

## 2024-03-19 ENCOUNTER — Telehealth: Payer: Self-pay | Admitting: Allergy and Immunology

## 2024-03-19 NOTE — Telephone Encounter (Signed)
 Patient called and stated that she was returning your call. Patient is requesting a call back at [phone]

## 2024-03-22 ENCOUNTER — Ambulatory Visit (INDEPENDENT_AMBULATORY_CARE_PROVIDER_SITE_OTHER)

## 2024-03-22 DIAGNOSIS — J4550 Severe persistent asthma, uncomplicated: Secondary | ICD-10-CM | POA: Diagnosis not present

## 2024-03-22 NOTE — Telephone Encounter (Signed)
 Jennifer Mullins came into the office  I told Jennifer Mullins, per Jennifer Mullins, that we can discontinue the medications that we prescribe but not the medications that other providers have prescribed. When I told Jennifer Mullins this she stated the following:  SHE SAID SHE WANTS HER MED LIST UPDATED THROUGH US  AND THAT .Jennifer Mullins DOESNT SOUND RIGHT... AND THAT IF WE TAKE IT OFF THE MED LIST IT IS SUPPOSED TO GO OFF PERIOD AND SHE SHOULDNT HAVE TO CALL EACH PROVIDER TO MAKE THEM TAKE OFF THE MEDS.  Jennifer Mullins also wants a revised letter by Dr. Jerelene Mullins to states Jennifer Mullins has a previous medical history of Left Arm DVT and Left Pulmonary Embolism.  She does not want it to state that is why she is on Nucala .  She just wants it stated in the letter that she has those issues too.  Jennifer Mullins picked up the notes and she stated the note that she received had Jennifer Mullins's name on the top and on the bottom and she states the latter cannot look like that.  It has to look as if Dr. Jerelene Mullins typed it himself and needs his signature.  Jennifer Mullins states she will call or come by on Tuesday when Jennifer Mullins is in [place].

## 2024-03-22 NOTE — Telephone Encounter (Signed)
 Please see telephone encounter from 03/10/2024 for more information.

## 2024-03-22 NOTE — Telephone Encounter (Signed)
 Letter has been re-printed without Jennifer Mullins's name on it. Medication list has been updated again, I have marked the medications the patient is no longer taking for removal by the prescribing provider. Dr. Jerelene Monday please advise on revising letter per patient request.

## 2024-03-22 NOTE — Addendum Note (Signed)
 Addended by: Denton Flakes on: 03/22/2024 12:06 PM   Modules accepted: Orders

## 2024-03-23 NOTE — Telephone Encounter (Signed)
 Left a message informing patient that updated letter is ready for pick up. I also informed her via message that the medication list has been updated. I informed her that I have marked the medications she is no longer taking for removal from the prescribing provider.

## 2024-03-23 NOTE — Telephone Encounter (Signed)
 I spoke to Dr. Jerelene Monday in his office and informed him of Jennifer Mullins's visit to the office yesterday about her note.  I told him the situation and what verbiage Jennifer Mullins was asking for.  Dr. Jerelene Monday verbally told me to let Jennifer Mullins know that she can write the letter for [facility] and put in there the verbiage that Jennifer Mullins is requesting. Jennifer Mullins has been notified.

## 2024-03-28 ENCOUNTER — Emergency Department (HOSPITAL_COMMUNITY)

## 2024-03-28 ENCOUNTER — Emergency Department (HOSPITAL_COMMUNITY)
Admission: EM | Admit: 2024-03-28 | Discharge: 2024-03-28 | Disposition: A | Discharge status: Home / Self Care | Attending: Emergency Medicine | Admitting: Emergency Medicine

## 2024-03-28 ENCOUNTER — Other Ambulatory Visit: Payer: Self-pay

## 2024-03-28 ENCOUNTER — Encounter (HOSPITAL_COMMUNITY): Payer: Self-pay

## 2024-03-28 DIAGNOSIS — Z7984 Long term (current) use of oral hypoglycemic drugs: Secondary | ICD-10-CM | POA: Diagnosis not present

## 2024-03-28 DIAGNOSIS — Z7901 Long term (current) use of anticoagulants: Secondary | ICD-10-CM | POA: Insufficient documentation

## 2024-03-28 DIAGNOSIS — Z79899 Other long term (current) drug therapy: Secondary | ICD-10-CM | POA: Insufficient documentation

## 2024-03-28 DIAGNOSIS — R42 Dizziness and giddiness: Secondary | ICD-10-CM | POA: Insufficient documentation

## 2024-03-28 DIAGNOSIS — Z7982 Long term (current) use of aspirin: Secondary | ICD-10-CM | POA: Diagnosis not present

## 2024-03-28 DIAGNOSIS — I10 Essential (primary) hypertension: Secondary | ICD-10-CM | POA: Insufficient documentation

## 2024-03-28 DIAGNOSIS — R569 Unspecified convulsions: Secondary | ICD-10-CM

## 2024-03-28 DIAGNOSIS — R55 Syncope and collapse: Secondary | ICD-10-CM

## 2024-03-28 LAB — CBC
HCT: 44.9 % (ref 36.0–46.0)
Hemoglobin: 14.5 g/dL (ref 12.0–15.0)
MCH: 31.7 pg (ref 26.0–34.0)
MCHC: 32.3 g/dL (ref 30.0–36.0)
MCV: 98 fL (ref 80.0–100.0)
Platelets: 224 10*3/uL (ref 150–400)
RBC: 4.58 MIL/uL (ref 3.87–5.11)
RDW: 13.4 % (ref 11.5–15.5)
WBC: 4.9 10*3/uL (ref 4.0–10.5)
nRBC: 0 % (ref 0.0–0.2)

## 2024-03-28 LAB — COMPREHENSIVE METABOLIC PANEL WITH GFR
ALT: 20 U/L (ref 0–44)
AST: 22 U/L (ref 15–41)
Albumin: 4.3 g/dL (ref 3.5–5.0)
Alkaline Phosphatase: 74 U/L (ref 38–126)
Anion gap: 11 (ref 5–15)
BUN: 14 mg/dL (ref 6–20)
CO2: 22 mmol/L (ref 22–32)
Calcium: 9.9 mg/dL (ref 8.9–10.3)
Chloride: 110 mmol/L (ref 98–111)
Creatinine, Ser: 0.79 mg/dL (ref 0.44–1.00)
GFR, Estimated: >60 mL/min (ref >60)
Glucose, Bld: 115 mg/dL — ABNORMAL HIGH (ref 70–99)
Potassium: 4.2 mmol/L (ref 3.5–5.1)
Sodium: 143 mmol/L (ref 135–145)
Total Bilirubin: 0.7 mg/dL (ref 0.0–1.2)
Total Protein: 7.5 g/dL (ref 6.5–8.1)

## 2024-03-28 LAB — URINALYSIS, ROUTINE W REFLEX MICROSCOPIC
Bacteria, UA: NONE SEEN
Bilirubin Urine: NEGATIVE
Glucose, UA: NEGATIVE mg/dL
Hgb urine dipstick: NEGATIVE
Ketones, ur: NEGATIVE mg/dL
Nitrite: NEGATIVE
Protein, ur: NEGATIVE mg/dL
Specific Gravity, Urine: 1.04 — ABNORMAL HIGH (ref 1.005–1.030)
pH: 6 (ref 5.0–8.0)

## 2024-03-28 LAB — PROTIME-INR
INR: 1 (ref 0.8–1.2)
Prothrombin Time: 13.3 s (ref 11.4–15.2)

## 2024-03-28 LAB — DIFFERENTIAL
Abs Immature Granulocytes: 0.02 10*3/uL (ref 0.00–0.07)
Basophils Absolute: 0 10*3/uL (ref 0.0–0.1)
Basophils Relative: 0 %
Eosinophils Absolute: 0 10*3/uL (ref 0.0–0.5)
Eosinophils Relative: 0 %
Immature Granulocytes: 0 %
Lymphocytes Relative: 48 %
Lymphs Abs: 2.5 10*3/uL (ref 0.7–4.0)
Monocytes Absolute: 0.4 10*3/uL (ref 0.1–1.0)
Monocytes Relative: 8 %
Neutro Abs: 2.3 10*3/uL (ref 1.7–7.7)
Neutrophils Relative %: 44 %

## 2024-03-28 LAB — RAPID URINE DRUG SCREEN, HOSP PERFORMED
Amphetamines: NOT DETECTED
Barbiturates: NOT DETECTED
Benzodiazepines: NOT DETECTED
Cocaine: NOT DETECTED
Opiates: POSITIVE — AB
Tetrahydrocannabinol: NOT DETECTED

## 2024-03-28 LAB — POC URINE PREG, ED: Preg Test, Ur: NEGATIVE

## 2024-03-28 LAB — APTT: aPTT: 25 s (ref 24–36)

## 2024-03-28 LAB — ETHANOL: Alcohol, Ethyl (B): <15 mg/dL (ref <15)

## 2024-03-28 MED ORDER — IOHEXOL 350 MG/ML SOLN
100.0000 mL | Freq: Once | INTRAVENOUS | Status: AC | PRN
  Administered 2024-03-28: 100 mL via INTRAVENOUS

## 2024-03-28 MED ORDER — LORAZEPAM 2 MG/ML IJ SOLN
1.0000 mg | Freq: Once | INTRAMUSCULAR | Status: AC
  Administered 2024-03-28: 1 mg via INTRAVENOUS
  Filled 2024-03-28: qty 1

## 2024-03-28 MED ORDER — SODIUM CHLORIDE 0.9 % IV BOLUS
2000.0000 mL | Freq: Once | INTRAVENOUS | Status: AC
  Administered 2024-03-28: 2000 mL via INTRAVENOUS

## 2024-03-28 MED ORDER — ACETAMINOPHEN 325 MG PO TABS
650.0000 mg | ORAL_TABLET | Freq: Once | ORAL | Status: AC
  Administered 2024-03-28: 650 mg via ORAL
  Filled 2024-03-28: qty 2

## 2024-03-28 MED ORDER — ALPRAZOLAM 0.5 MG PO TABS
0.5000 mg | ORAL_TABLET | Freq: Once | ORAL | Status: AC
  Administered 2024-03-28: 0.5 mg via ORAL
  Filled 2024-03-28: qty 1

## 2024-03-28 MED ORDER — PANTOPRAZOLE SODIUM 40 MG PO TBEC
40.0000 mg | DELAYED_RELEASE_TABLET | Freq: Once | ORAL | Status: AC
Start: 2024-03-28 — End: 2024-03-28
  Administered 2024-03-28: 40 mg via ORAL
  Filled 2024-03-28: qty 1

## 2024-03-28 NOTE — ED Provider Notes (Signed)
 [place] EMERGENCY DEPARTMENT AT [facility] Provider Note   CSN: [id number] Arrival date & time: 03/28/24  5621     History {Add pertinent medical, surgical, social history, OB history to HPI:1} Chief Complaint  Patient presents with   Code Stroke    Jennifer Mullins is a 53 y.o. female.  Patient is a 53 year old female with past medical history of hypertension, hyperlipidemia, GERD, pulmonary embolism, fibromyalgia, anxiety.  Patient brought by EMS after being found on the floor by her husband.  The story I am told by EMS is that the patient was feeling dizzy then walked to the kitchen and took a spoonful of salt.  She then plan to drink water , however apparently passed out and hit her head.  The patient was down for an unknown period of time before she was discovered by her husband who called 911 and patient was transported here.  She was having a right facial droop and slurred speech, so a code stroke was initiated.  Patient reports she went to bed yesterday evening at 1130 and this is the last known well time.       Home Medications Prior to Admission medications   Medication Sig Start Date End Date Taking? Authorizing Provider  albuterol  (VENTOLIN  HFA) 108 (90 Base) MCG/ACT inhaler INHALE 2 PUFFS INTO THE LUNGS EVERY 6 HOURS AS NEEDED FOR WHEEZING OR SHORT OF BREATH 11/24/23   Kozlow, Rema Care, MD  ALPRAZolam  (XANAX ) 0.5 MG tablet TAKE 1 TABLET BY MOUTH TWICE DAILY AS NEEDED. MAY TAKE ONE MORE TABLET BEFORE FOR ALLERGY SHOTS ONCE A WEEK (30 DAY supply) 01/05/20   Amos Kanner, PA-C  aspirin  81 MG chewable tablet Chew 81 mg by mouth daily. Take 2 tablets for a total of 162 mg daily    [provider]  AUVI-Q  0.3 MG/0.3ML SOAJ injection Use for life threatening allergic reactions 03/09/24   Kozlow, Eric J, MD  azelastine  (OPTIVAR ) 0.05 % ophthalmic solution Apply 1 drop to eye 2 (two) times daily. 02/27/21   [provider]  brimonidine-timolol (COMBIGAN)  0.2-0.5 % ophthalmic solution Apply 1 drop to eye 2 (two) times daily. Patient not taking: Reported on 03/09/2024 04/19/21   [provider]  budesonide  ([employer] BUDESONIDE ) 32 MCG/ACT nasal spray USE 1-2 SPRAYS INTO BOTH NOSTRILS EVERY DAY 03/09/24   Kozlow, Rema Care, MD  budesonide -formoterol  (SYMBICORT ) 80-4.5 MCG/ACT inhaler Inhale 2 puffs into the lungs 2 (two) times daily as needed. 03/09/24   Kozlow, Rema Care, MD  cetirizine  (ZYRTEC ) 10 MG tablet Take 1 tablet (10 mg total) by mouth 2 (two) times daily as needed for allergies. 03/09/24   Kozlow, Rema Care, MD  cromolyn  (OPTICROM ) 4 % ophthalmic solution Place 1 drop into both eyes 4 (four) times daily.  03/20/17   [provider]  cycloSPORINE  (RESTASIS ) 0.05 % ophthalmic emulsion Place 1 drop into both eyes 2 (two) times daily.    [provider]  cyproheptadine  (PERIACTIN ) 4 MG tablet TAKE 1/2 TO 1 TABLET BY MOUTH AT BEDTIME 01/08/24   Kozlow, Rema Care, MD  diclofenac  (VOLTAREN ) 0.1 % ophthalmic solution 1 drop 2 (two) times daily. 02/27/21   [provider]  diclofenac  Sodium (VOLTAREN ) 1 % GEL Apply 2 g topically 4 (four) times daily. Patient not taking: Reported on 03/22/2024 02/25/20   [provider]  Diethylpropion  HCl CR 75 MG TB24 Take 1 tablet (75 mg total) by mouth daily. 01/05/20   Amos Kanner, PA-C  dorzolamide-timolol (  COSOPT) 22.3-6.8 MG/ML ophthalmic solution Place 1 drop into both eyes 2 (two) times daily. 03/26/22   [provider]  ELIQUIS  2.5 MG TABS tablet Take 2.5 mg by mouth 2 (two) times daily. 11/12/21   [provider]  EPINEPHrine  0.3 mg/0.3 mL IJ SOAJ injection Inject 0.3 mg into the muscle as needed for anaphylaxis. 02/09/24   Kozlow, Rema Care, MD  HYDROcodone -acetaminophen  (NORCO) 7.5-325 MG tablet Take 1 tablet by mouth every 6 (six) hours as needed for moderate pain.    [provider]  losartan  (COZAAR ) 25 MG tablet Take 1 tablet (25 mg total) by mouth daily. Patient  not taking: Reported on 03/09/2024 07/05/19   Amos Kanner, PA-C  lubiprostone (AMITIZA) 8 MCG capsule SMARTSIG:1 Capsule(s) By Mouth 1-4 Times Daily 02/26/22   [provider]  megestrol  (MEGACE ) 40 MG tablet TAKE 3 TABLETS DAILY BY MOUTH FOR 5 DAYS, 2 TABLETS DAILY FOR 5 DAYS, THEN 1 TABLET DAILY Patient not taking: Reported on 03/09/2024 07/27/20   Wendelyn Halter, MD  meloxicam  (MOBIC ) 7.5 MG tablet TAKE 1 TABLET BY MOUTH EVERY DAY Patient not taking: Reported on 03/09/2024 02/23/24   Hyatt, Max T, DPM  Mepolizumab  (NUCALA ) 100 MG/ML SOAJ Inject 1 mL (100 mg total) into the skin every 28 (twenty-eight) days. 06/13/23   Kozlow, Rema Care, MD  metFORMIN  (GLUCOPHAGE ) 500 MG tablet TAKE 1 TABLET BY MOUTH EVERY DAY WITH BREAKFAST 07/05/19   Amos Kanner, PA-C  montelukast  (SINGULAIR ) 10 MG tablet TAKE 1 TABLET BY MOUTH EVERYDAY AT BEDTIME 03/01/24   Kozlow, Rema Care, MD  pantoprazole  (PROTONIX ) 40 MG tablet Take 1 tablet (40 mg total) by mouth 2 (two) times daily. 02/09/24   Kozlow, Rema Care, MD  pravastatin  (PRAVACHOL ) 40 MG tablet Take 1 tablet (40 mg total) by mouth daily. Patient taking differently: Take 40 mg by mouth 2 (two) times daily. 02/04/20   Dettinger, Lucio Sabin, MD  progesterone  (PROMETRIUM ) 200 MG capsule Take 1 capsule (200 mg total) by mouth at bedtime. 04/08/23   Wendelyn Halter, MD  spironolactone  (ALDACTONE ) 25 MG tablet Take 25 mg by mouth 2 (two) times daily. 02/23/24   [provider]  spironolactone  (ALDACTONE ) 50 MG tablet TAKE 1 TABLET BY MOUTH TWICE DAILY Patient not taking: Reported on 03/09/2024 04/23/22   Devon Fogo, MD  terconazole  (TERAZOL 7 ) 0.4 % vaginal cream Place 1 applicator vaginally at bedtime. Patient not taking: Reported on 03/09/2024 04/08/23   Wendelyn Halter, MD  UNABLE TO FIND Allergy shots-both arms-weekly    [provider]  VANIQA  13.9 % cream APPLY TO AFFECTED AREA TWICE DAILY Patient not taking: Reported on 03/09/2024 07/08/22   Devon Fogo, MD       Allergies    Demerol [meperidine], Morphine  and codeine , Other, Cardizem  [diltiazem hcl], Diltiazem hcl, Gabapentin, Lisinopril , Lyrica [pregabalin], and Tramadol     Review of Systems   Review of Systems  All other systems reviewed and are negative.   Physical Exam Updated Vital Signs BP (!) 114/99 (BP Location: Right Arm)   Pulse 82   Resp 19   Ht 5\' 1"  (1.549 m)   Wt 59.8 kg   BMI 24.90 kg/m  Physical Exam Vitals and nursing note reviewed.  Constitutional:      General: She is not in acute distress.    Appearance: She is well-developed. She is not diaphoretic.  HENT:     Head: Normocephalic and atraumatic.  Cardiovascular:  Rate and Rhythm: Normal rate and regular rhythm.     Heart sounds: No murmur heard.    No friction rub. No gallop.  Pulmonary:     Effort: Pulmonary effort is normal. No respiratory distress.     Breath sounds: Normal breath sounds. No wheezing.  Abdominal:     General: Bowel sounds are normal. There is no distension.     Palpations: Abdomen is soft.     Tenderness: There is no abdominal tenderness.  Musculoskeletal:        General: Normal range of motion.     Cervical back: Normal range of motion and neck supple.  Skin:    General: Skin is warm and dry.  Neurological:     Mental Status: She is alert and oriented to person, place, and time.     Comments: Patient is awake and alert.  She moves all 4 extremities, but movements are somewhat rigid.  She responds to questions and commands appropriately.  She does have slurred speech, but no expressive or receptive aphasia.     ED Results / Procedures / Treatments   Labs (all labs ordered are listed, but only abnormal results are displayed) Labs Reviewed  ETHANOL  PROTIME-INR  APTT  CBC  DIFFERENTIAL  COMPREHENSIVE METABOLIC PANEL WITH GFR  RAPID URINE DRUG SCREEN, HOSP PERFORMED  I-STAT CHEM 8, ED  POC URINE PREG, ED    EKG None  Radiology No results  found.  Procedures Procedures    Medications Ordered in ED Medications - No data to display  ED Course/ Medical Decision Making/ A&P   {   Click here for ABCD2, HEART and other calculatorsREFRESH Note before signing :1}                              Medical Decision Making Amount and/or Complexity of Data Reviewed Labs: ordered. Radiology: ordered.  Risk Prescription drug management.   ***  {Document critical care time when appropriate:1} {Document review of labs and clinical decision tools ie heart score, Chads2Vasc2 etc:1}  {Document your independent review of radiology images, and any outside records:1} {Document your discussion with family members, caretakers, and with consultants:1} {Document social determinants of health affecting pt's care:1} {Document your decision making why or why not admission, treatments were needed:1} Final Clinical Impression(s) / ED Diagnoses Final diagnoses:  None    Rx / DC Orders ED Discharge Orders     None

## 2024-03-28 NOTE — ED Provider Notes (Signed)
 Patient's symptoms improved with 2 L of fluids and observation.  Her MRI of her brain and cervical spine were unremarkable.  CT scans of the head and spine were unremarkable.  Labs show moderate dehydration.  Patient with a syncopal episode and possible seizure activity.  She is referred to neurology and has been instructed not to drive until seen by neurology   Cheyenne Cotta, MD 03/28/24 1257

## 2024-03-28 NOTE — ED Notes (Signed)
 Patient transported to CT

## 2024-03-28 NOTE — Consult Note (Addendum)
 [facility] Consult Services   Patient Name:   Jennifer Mullins, Sehnert Date of Birth:   30-Apr-1971 Identification Number:   MRN - [id number] Date of Service:   03/28/2024 05:22:35  Diagnosis:       I63.89 - Cerebrovascular accident (CVA) due to other mechanism [facility])  Impression:      53yo woman with history of DM, HTN, HLD, prior PE on eliquis , asthma presents with left sided weakness and slurred speech, incontinent of stool. Not a candidate for thrombolytics with LKW >4.5 hours out (noticed dizziness and off balance getting out of bed), also reports taking her DOAC within the past 48hrs. Symptoms could be posterior circulation stroke.  CT of spine also pending.  CTA pending read- no obvious LVO or dissection on my review, radiology read pending- if negative recommend holding eliquis , continue aspirin , get MRI brain w/o, check 2d echo, permissive HTN to <220/120. Needs MRI C spine w/o urgently as well given the unilateral weakness and fall with incontinence.  Our recommendations are outlined below.  Recommendations:        Stroke/Telemetry Floor       Neuro Checks (Q4)       Bedside Swallow Eval       DVT Prophylaxis       Head of Bed 30 Degrees       Euglycemia and Avoid Hyperthermia (PRN Acetaminophen )       Hold Anticoagulation for Now       Initiate or continue Aspirin  81 MG daily       Antihypertensives PRN if Blood pressure is greater than 220/120 or there is a concern for End organ damage/contraindications for permissive HTN. If blood pressure is greater than 220/120 give labetalol PO or IV or Vasotec IV with a goal of 15% reduction in BP during the first 24 hours.       MRI brain w/o  Sign Out:       Discussed with Emergency Department Provider    ------------------------------------------------------------------------------  Advanced Imaging:  CTA Head and Neck Completed.  LVO:No   Metrics: Last Known Well: 03/27/2024 23:30:00 Dispatch Time: 03/28/2024  05:22:34 Arrival Time: 03/28/2024 05:16:00 Initial Response Time: 03/28/2024 05:28:54 Symptoms: left sided weakness and slurred speech. Initial patient interaction: 03/28/2024 05:30:36 NIHSS Assessment Completed: 03/28/2024 05:44:25 Patient is not a candidate for Thrombolytic. Thrombolytic Medical Decision: 03/28/2024 05:44:27 Patient was not deemed candidate for Thrombolytic because of following reasons: LKW outside 4.5 hr window. . Use of NOAC in last 48 hrs. .  CT Head: I personally reviewed all the CT images that were available to me and it showed: no ICH or acute core infarct  Primary Provider Notified of Diagnostic Impression and Management Plan on: 03/28/2024 05:58:03    ------------------------------------------------------------------------------  History of Present Illness: Patient is a 53 year old Female.  Patient was brought by EMS for symptoms of left sided weakness and slurred speech. 53yo woman with history of DM, HTN, HLD, prior PE on eliquis , asthma presents with left sided weakness and slurred speech. She reports she went to bed at 1130pm this evening feeling fine. She woke up feeling hot, noted dizziness, feeling like she would pass out. She felt off balance walking to the kitchen. In the kitchen she began noticing left sided numbness and tingling, ended up falling, and couldn't get up. She called out for her husband and eventually EMS was called. She takes eliquis  daily (does not endorse taking it BID), reports she is sure she took it yesterday morning (5/10)  Past Medical History:      Hypertension      Diabetes Mellitus      Hyperlipidemia  Medications:  Anticoagulant use:  Yes eliquis , last dose yesterday morning Antiplatelet use: Yes aspirin  Reviewed EMR for current medications  Allergies:  Reviewed  Social History: Drug Use: No  Family History:  There is no family history of premature cerebrovascular disease pertinent to this  consultation  ROS : 14 Points Review of Systems was performed and was negative except mentioned in HPI.  Past Surgical History: There Is No Surgical History Contributory To Today's Visit    Examination: BP(114/99), Pulse(82), 1A: Level of Consciousness - Alert; keenly responsive + 0 1B: Ask Month and Age - Both Questions Right + 0 1C: Blink Eyes & Squeeze Hands - Performs Both Tasks + 0 2: Test Horizontal Extraocular Movements - Normal + 0 3: Test Visual Fields - No Visual Loss + 0 4: Test Facial Palsy (Use Grimace if Obtunded) - Partial paralysis (lower face) + 2 5A: Test Left Arm Motor Drift - Drift, hits bed + 2 5B: Test Right Arm Motor Drift - No Drift for 10 Seconds + 0 6A: Test Left Leg Motor Drift - Drift, hits bed + 2 6B: Test Right Leg Motor Drift - Drift, but doesn't hit bed + 1 7: Test Limb Ataxia (FNF/Heel-Shin) - No Ataxia + 0 8: Test Sensation - Mild-Moderate Loss: Less Sharp/More Dull + 1 9: Test Language/Aphasia - Normal; No aphasia + 0 10: Test Dysarthria - Severe Dysarthria: Unintelligble Slurring or Out of Proportion to Aphasia + 2 11: Test Extinction/Inattention - No abnormality + 0  NIHSS Score: 10  NIHSS Free Text : left leg decreased sensation to LT per pt, left arm significantly weak, both legs weak L>R, speech is moderate to severe dysarthria and hard to understand, right facial weakness with minimal movement seen, in C collar, evidence of fecal incontinence  Pre-Morbid Modified Rankin Scale: 0 Points = No symptoms at all  Spoke with : Dr. Lula Sale I reviewed the available imaging via Rapid and initiated discussion with the primary provider  This consult was conducted in real time using interactive audio and video technology. Patient was informed of the technology being used for this visit and agreed to proceed. Patient located in hospital and provider located at home/office setting.   Patient is being evaluated for possible acute neurologic impairment  and high probability of imminent or life-threatening deterioration. I spent total of 35 minutes providing care to this patient, including time for face to face visit via telemedicine, review of medical records, imaging studies and discussion of findings with providers, the patient and/or family.   Dr Lorene Roman   [facility] For Inpatient follow-up with [facility] physician please call [facility] at [phone]. As we are not an outpatient service for any post hospital discharge needs please contact the hospital for assistance. If you have any questions for the [facility] physicians or need to reconsult for clinical or diagnostic changes please contact us  via [facility] at [phone].

## 2024-03-28 NOTE — ED Notes (Signed)
 CODE STROKE PAGED @ 0511 UPON EMS ENCODE EN ROUTE TO ER

## 2024-03-28 NOTE — Progress Notes (Signed)
 1914 - Code Stroke Activated   LKWT 2330. mRS 0. Patient presents to the ED by EMS after waking at 0430 walking to the kitchen felt dizzy possibly ataxic, fell, then had severe dysarthria, L facial droop, & L sided weakness  0519 - Patient to CT   0522 - Tele-Neurologist paged   [phone] - Dr. Wayna Hails joined stroke cart   [phone] - Patient arrived back in room from CT   No TNK due to LKWT > 4.5 hours and patient on Eliquis  and last dose was yesterday morning   0542 - NCCT results sent to Dr. Wayna Hails   [phone] - Patient back to CT for advanced imaging   0625 - CTA & CTP results sent to Dr. Wayna Hails

## 2024-03-28 NOTE — ED Triage Notes (Signed)
 Patient from home for Code Stroke. LKN uncertain; patient went to bed at 2330 last night and woke up at 0430 and "felt like she was going to black out". Reports she got up to go get water  and salt, when she fell and hit her head. Patient takes Eliquis . Upon arrival to ER, patient is alert and oriented to person and place; slurred speech, R side facial droop.

## 2024-03-28 NOTE — Discharge Instructions (Signed)
 Follow-up with the neurologist in the next couple weeks.  Do not do any driving until evaluated by the neurologist.  Try to stay hydrated.

## 2024-03-29 LAB — CBG MONITORING, ED: Glucose-Capillary: 110 mg/dL — ABNORMAL HIGH (ref 70–99)

## 2024-03-30 ENCOUNTER — Encounter (HOSPITAL_COMMUNITY): Payer: Self-pay | Admitting: Emergency Medicine

## 2024-03-31 ENCOUNTER — Other Ambulatory Visit: Payer: Self-pay | Admitting: Obstetrics & Gynecology

## 2024-04-05 ENCOUNTER — Ambulatory Visit (INDEPENDENT_AMBULATORY_CARE_PROVIDER_SITE_OTHER): Payer: Self-pay

## 2024-04-05 ENCOUNTER — Telehealth: Payer: Self-pay | Admitting: Podiatry

## 2024-04-05 ENCOUNTER — Encounter: Payer: Self-pay | Admitting: Podiatry

## 2024-04-05 DIAGNOSIS — S93692D Other sprain of left foot, subsequent encounter: Secondary | ICD-10-CM

## 2024-04-05 DIAGNOSIS — S9001XD Contusion of right ankle, subsequent encounter: Secondary | ICD-10-CM

## 2024-04-05 DIAGNOSIS — J309 Allergic rhinitis, unspecified: Secondary | ICD-10-CM

## 2024-04-05 NOTE — Telephone Encounter (Signed)
 I think there might have been some miscommunication... is this a pending MRI waiting for additional info or does a new order need to be placed? This is what Dr. Lara Plants has in his notes from her last visit...  "Contact name at [facility] imaging is Latina Pol and she is familiar with Jennifer Mullins's case and may be of assistance with scheduling and coding for MRIs bilateral"

## 2024-04-05 NOTE — Telephone Encounter (Signed)
 New orders for MRI bilateral ankle placed today

## 2024-04-05 NOTE — Telephone Encounter (Signed)
 Patient called in to inquire about the status of the referral for her MRI. She expressed frustration that the referral has not yet been sent. She stated that she contacted her insurance company, who confirmed they have not received a prior authorization (PA) request.  The patient is requesting that the referral be sent as soon as possible. She also requested a call back once the referral has been submitted.

## 2024-04-13 ENCOUNTER — Telehealth: Payer: Self-pay

## 2024-04-13 ENCOUNTER — Other Ambulatory Visit: Payer: Self-pay | Admitting: Obstetrics & Gynecology

## 2024-04-13 DIAGNOSIS — Z0000 Encounter for general adult medical examination without abnormal findings: Secondary | ICD-10-CM

## 2024-04-13 NOTE — Telephone Encounter (Signed)
 MRI left ankle pa was denied due to there not being results of an xray of the left ankle. A P2P can be done by calling [phone] and reference #[id number]

## 2024-04-19 ENCOUNTER — Other Ambulatory Visit: Payer: Self-pay | Admitting: Allergy and Immunology

## 2024-04-21 ENCOUNTER — Ambulatory Visit

## 2024-04-22 ENCOUNTER — Telehealth: Payer: Self-pay | Admitting: Podiatry

## 2024-04-22 NOTE — Telephone Encounter (Signed)
 Crystal from [employer], [employer] is requesting most recent x-rays for left side of left foot for patient Jennifer Mullins fax# [phone]/phone# [phone] option 1

## 2024-04-22 NOTE — Telephone Encounter (Signed)
 We are aware. I spoke with patient and she placed me on a 3 way call with the insurance company. The insurance company is requesting an xray with report of the left foot so they can approve MRI. I RE-FAXED xray and clinical note to [employer] for reconsideration. We will see if this will be sufficient enough for an approval. If not patient will need referral to [facility] imaging for xray of left foot and ankle. Once we get a response from the insurance company if the notes sent aren't I will place order for xray.

## 2024-04-23 ENCOUNTER — Ambulatory Visit (INDEPENDENT_AMBULATORY_CARE_PROVIDER_SITE_OTHER): Admitting: *Deleted

## 2024-04-23 DIAGNOSIS — J4550 Severe persistent asthma, uncomplicated: Secondary | ICD-10-CM | POA: Diagnosis not present

## 2024-05-07 ENCOUNTER — Ambulatory Visit (INDEPENDENT_AMBULATORY_CARE_PROVIDER_SITE_OTHER): Payer: Self-pay

## 2024-05-07 DIAGNOSIS — J309 Allergic rhinitis, unspecified: Secondary | ICD-10-CM

## 2024-05-14 ENCOUNTER — Ambulatory Visit (INDEPENDENT_AMBULATORY_CARE_PROVIDER_SITE_OTHER)

## 2024-05-14 DIAGNOSIS — J309 Allergic rhinitis, unspecified: Secondary | ICD-10-CM

## 2024-05-19 ENCOUNTER — Ambulatory Visit (INDEPENDENT_AMBULATORY_CARE_PROVIDER_SITE_OTHER)

## 2024-05-19 DIAGNOSIS — J309 Allergic rhinitis, unspecified: Secondary | ICD-10-CM

## 2024-05-24 ENCOUNTER — Ambulatory Visit

## 2024-05-26 ENCOUNTER — Other Ambulatory Visit: Payer: Self-pay | Admitting: Allergy and Immunology

## 2024-05-27 ENCOUNTER — Ambulatory Visit

## 2024-05-27 DIAGNOSIS — J4550 Severe persistent asthma, uncomplicated: Secondary | ICD-10-CM

## 2024-06-03 ENCOUNTER — Ambulatory Visit
Admission: RE | Admit: 2024-06-03 | Discharge: 2024-06-03 | Disposition: A | Discharge status: Home / Self Care | Source: Ambulatory Visit | Attending: Podiatry | Admitting: Podiatry

## 2024-06-03 DIAGNOSIS — S93692D Other sprain of left foot, subsequent encounter: Secondary | ICD-10-CM

## 2024-06-03 DIAGNOSIS — S9001XD Contusion of right ankle, subsequent encounter: Secondary | ICD-10-CM

## 2024-06-08 ENCOUNTER — Ambulatory Visit: Payer: Self-pay | Admitting: Podiatry

## 2024-06-10 NOTE — Telephone Encounter (Signed)
 Error

## 2024-06-21 ENCOUNTER — Other Ambulatory Visit: Payer: Self-pay | Admitting: Obstetrics & Gynecology

## 2024-06-22 ENCOUNTER — Ambulatory Visit

## 2024-06-24 ENCOUNTER — Ambulatory Visit

## 2024-06-24 DIAGNOSIS — J4550 Severe persistent asthma, uncomplicated: Secondary | ICD-10-CM | POA: Diagnosis not present

## 2024-06-25 ENCOUNTER — Other Ambulatory Visit: Payer: Self-pay | Admitting: *Deleted

## 2024-06-30 ENCOUNTER — Encounter: Payer: Self-pay | Admitting: Hematology

## 2024-06-30 ENCOUNTER — Ambulatory Visit (INDEPENDENT_AMBULATORY_CARE_PROVIDER_SITE_OTHER)

## 2024-06-30 ENCOUNTER — Ambulatory Visit
Admission: RE | Admit: 2024-06-30 | Discharge: 2024-06-30 | Disposition: A | Discharge status: Home / Self Care | Source: Ambulatory Visit | Attending: Obstetrics & Gynecology | Admitting: Obstetrics & Gynecology

## 2024-06-30 DIAGNOSIS — J309 Allergic rhinitis, unspecified: Secondary | ICD-10-CM

## 2024-06-30 DIAGNOSIS — Z0000 Encounter for general adult medical examination without abnormal findings: Secondary | ICD-10-CM

## 2024-07-15 ENCOUNTER — Ambulatory Visit: Admitting: Podiatry

## 2024-07-18 ENCOUNTER — Other Ambulatory Visit: Payer: Self-pay | Admitting: Allergy and Immunology

## 2024-07-22 ENCOUNTER — Ambulatory Visit

## 2024-07-22 DIAGNOSIS — J4550 Severe persistent asthma, uncomplicated: Secondary | ICD-10-CM | POA: Diagnosis not present

## 2024-07-30 ENCOUNTER — Other Ambulatory Visit: Payer: Self-pay | Admitting: Allergy and Immunology

## 2024-08-12 ENCOUNTER — Ambulatory Visit (INDEPENDENT_AMBULATORY_CARE_PROVIDER_SITE_OTHER)

## 2024-08-12 ENCOUNTER — Ambulatory Visit (INDEPENDENT_AMBULATORY_CARE_PROVIDER_SITE_OTHER): Admitting: Podiatry

## 2024-08-12 ENCOUNTER — Encounter: Payer: Self-pay | Admitting: Podiatry

## 2024-08-12 DIAGNOSIS — M722 Plantar fascial fibromatosis: Secondary | ICD-10-CM

## 2024-08-12 DIAGNOSIS — J309 Allergic rhinitis, unspecified: Secondary | ICD-10-CM

## 2024-08-16 NOTE — Progress Notes (Signed)
 She presents today to discuss her MRI findings.  She is also concerned about her bunion deformities.  She states that she is doing so-so Sunday she is good Sunday she is not so good.  Objective: Hallux valgus to fomites bilateral but are minimal she has good range of motion no crepitation no pain.  MRI findings are consistent with mild medial band plantar fasciitis bilaterally and marrow edema in the talar dome of the left ankle and the right foot demonstrating mild medial band plantar fasciitis and mild Achilles tendinosis.  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  Mild hallux valgus bilateral.  Plan: At this point she and I discussed different options for her self-treatment of the hallux valgus deformities such as padding and braces.  We evaluated the braces that she had with her today.  Demonstrating for her how to place these properly she understands this is amendable to it and will try this before surgical intervention.  Will follow-up with me on an as-needed basis.

## 2024-08-17 ENCOUNTER — Other Ambulatory Visit: Payer: Self-pay | Admitting: Allergy and Immunology

## 2024-08-19 ENCOUNTER — Ambulatory Visit

## 2024-08-19 DIAGNOSIS — J4550 Severe persistent asthma, uncomplicated: Secondary | ICD-10-CM

## 2024-09-07 ENCOUNTER — Ambulatory Visit: Admitting: Allergy and Immunology

## 2024-09-14 ENCOUNTER — Other Ambulatory Visit: Payer: Self-pay | Admitting: Allergy and Immunology

## 2024-09-14 DIAGNOSIS — J309 Allergic rhinitis, unspecified: Secondary | ICD-10-CM

## 2024-09-15 ENCOUNTER — Other Ambulatory Visit: Payer: Self-pay | Admitting: Allergy and Immunology

## 2024-09-16 ENCOUNTER — Ambulatory Visit (INDEPENDENT_AMBULATORY_CARE_PROVIDER_SITE_OTHER)

## 2024-09-16 DIAGNOSIS — J4550 Severe persistent asthma, uncomplicated: Secondary | ICD-10-CM | POA: Diagnosis not present

## 2024-09-18 ENCOUNTER — Other Ambulatory Visit: Payer: Self-pay | Admitting: Obstetrics & Gynecology

## 2024-09-20 ENCOUNTER — Other Ambulatory Visit (HOSPITAL_COMMUNITY): Payer: Self-pay | Admitting: Internal Medicine

## 2024-09-20 ENCOUNTER — Encounter: Payer: Self-pay | Admitting: *Deleted

## 2024-09-20 DIAGNOSIS — Z8639 Personal history of other endocrine, nutritional and metabolic disease: Secondary | ICD-10-CM

## 2024-09-22 ENCOUNTER — Ambulatory Visit (HOSPITAL_COMMUNITY)

## 2024-09-29 ENCOUNTER — Ambulatory Visit (HOSPITAL_COMMUNITY)
Admission: RE | Admit: 2024-09-29 | Discharge: 2024-09-29 | Disposition: A | Discharge status: Home / Self Care | Source: Ambulatory Visit | Attending: Internal Medicine | Admitting: Internal Medicine

## 2024-09-29 DIAGNOSIS — Z8639 Personal history of other endocrine, nutritional and metabolic disease: Secondary | ICD-10-CM | POA: Diagnosis present

## 2024-10-12 ENCOUNTER — Ambulatory Visit (INDEPENDENT_AMBULATORY_CARE_PROVIDER_SITE_OTHER)

## 2024-10-12 DIAGNOSIS — J4550 Severe persistent asthma, uncomplicated: Secondary | ICD-10-CM

## 2024-11-03 ENCOUNTER — Ambulatory Visit (INDEPENDENT_AMBULATORY_CARE_PROVIDER_SITE_OTHER)

## 2024-11-03 DIAGNOSIS — J309 Allergic rhinitis, unspecified: Secondary | ICD-10-CM

## 2024-11-05 ENCOUNTER — Encounter: Payer: Self-pay | Admitting: Obstetrics & Gynecology

## 2024-11-05 ENCOUNTER — Other Ambulatory Visit (HOSPITAL_COMMUNITY)
Admission: RE | Admit: 2024-11-05 | Discharge: 2024-11-05 | Disposition: A | Discharge status: Home / Self Care | Source: Ambulatory Visit | Attending: Obstetrics & Gynecology | Admitting: Obstetrics & Gynecology

## 2024-11-05 ENCOUNTER — Ambulatory Visit: Admitting: Obstetrics & Gynecology

## 2024-11-05 VITALS — BP 113/77 | HR 92 | Ht 62.0 in | Wt 136.0 lb

## 2024-11-05 DIAGNOSIS — Z01411 Encounter for gynecological examination (general) (routine) with abnormal findings: Secondary | ICD-10-CM

## 2024-11-05 DIAGNOSIS — B9689 Other specified bacterial agents as the cause of diseases classified elsewhere: Secondary | ICD-10-CM | POA: Diagnosis not present

## 2024-11-05 DIAGNOSIS — Z01419 Encounter for gynecological examination (general) (routine) without abnormal findings: Secondary | ICD-10-CM | POA: Diagnosis present

## 2024-11-05 DIAGNOSIS — N760 Acute vaginitis: Secondary | ICD-10-CM | POA: Diagnosis not present

## 2024-11-05 MED ORDER — METRONIDAZOLE 0.75 % VA GEL: 1.0000 | Freq: Every day | VAGINAL | 5 refills | Status: DC

## 2024-11-05 NOTE — Progress Notes (Signed)
 Subjective:     Jennifer Mullins is a 53 y.o. female here for a routine exam.  No LMP recorded. Patient is perimenopausal. G1P0101 Birth Control Method:  menopausal Menstrual Calendar(currently): irregular related to progesterone  withdrawal  Current complaints: none.   Current acute medical issues:  none currently   Recent Gynecologic History No LMP recorded. Patient is perimenopausal. Last P5/2023ap: ,  normal Last mammogram: 2024,  normal  Past Medical History:  Diagnosis Date   Anxiety    Arm vein blood clot, left    Arthritis    Asthma    Bilateral calcaneal spurs    Bilateral polycystic ovarian syndrome    Bulging lumbar disc    Carpal tunnel syndrome, bilateral    COPD (chronic obstructive pulmonary disease) (HCC)    BRONCHITIS   Diabetes mellitus without complication (HCC)    TYPE 2   DX  4-5 YRS AGO   ETD (Eustachian tube dysfunction), bilateral    left worse than right   Fibromyalgia    GAD (generalized anxiety disorder)    GERD (gastroesophageal reflux disease)    TAKES PRESCRIPTION MEDS   Glaucoma    BOTH EYES   Hyperlipidemia    Hypertension    Hypothyroidism    NODULES ON THYROID    PONV (postoperative nausea and vomiting)    Pulmonary embolism (HCC)    Tachycardia    TMJ (dislocation of temporomandibular joint)    Trigger finger    Trigger finger of left thumb     Past Surgical History:  Procedure Laterality Date   ANTERIOR CERVICAL DECOMP/DISCECTOMY FUSION N/A 11/13/2016   Procedure: Cervical five-six, Cervical six-seven Anterior Cervical Discectomy and Fusion, Allograft, Plate;  Surgeon: Oneil JAYSON Herald, MD;  Location: [facility];  Service: Orthopedics;  Laterality: N/A;   BREAST SURGERY     CARPAL TUNNEL RELEASE     DILATION AND CURETTAGE OF UTERUS     EXPLORATORY LAPAROTOMY     LIPOMA EXCISION Left 06/15/2018   Procedure: LEFT CALF LIPOMA EXCISION;  Surgeon: Herald Oneil JAYSON, MD;  Location: [facility];  Service: Orthopedics;  Laterality:  Left;   NASAL SINUS SURGERY  07/22/2013   Dr. Tanda in [place]   plate and six screws in back     REDUCTION MAMMAPLASTY Bilateral 2000   TRIGGER FINGER RELEASE Right 06/15/2018   Procedure: RIGHT TRIGGER THUMB RELEASE;  Surgeon: Herald Oneil JAYSON, MD;  Location: [facility];  Service: Orthopedics;  Laterality: Right;    OB History     Gravida  1   Para  1   Term      Preterm  1   AB      Living  1      SAB      IAB      Ectopic      Multiple      Live Births  1           Social History   Socioeconomic History   Marital status: Married    Spouse name: Not on file   Number of children: Not on file   Years of education: Not on file   Highest education level: Not on file  Occupational History   Occupation: Unemployed  Tobacco Use   Smoking status: Former    Current packs/day: 0.00    Average packs/day: 0.3 packs/day for 5.0 years (1.3 ttl pk-yrs)    Types: Cigarettes    Start date: 11/18/1984    Quit  date: 11/18/1989    Years since quitting: 34.9    Passive exposure: Never   Smokeless tobacco: Never  Vaping Use   Vaping status: Never Used  Substance and Sexual Activity   Alcohol  use: No   Drug use: No   Sexual activity: Yes    Birth control/protection: Post-menopausal  Other Topics Concern   Not on file  Social History Narrative   Not on file   Social Drivers of Health   Tobacco Use: Medium Risk (11/05/2024)   Patient History    Smoking Tobacco Use: Former    Smokeless Tobacco Use: Never    Passive Exposure: Never  Physicist, Medical Strain: Patient Declined (03/28/2022)   Overall Financial Resource Strain (CARDIA)    Difficulty of Paying Living Expenses: Patient declined  Food Insecurity: No Food Insecurity (03/28/2022)   Hunger Vital Sign    Worried About Running Out of Food in the Last Year: Never true    Ran Out of Food in the Last Year: Never true  Transportation Needs: No Transportation Needs (03/28/2022)   PRAPARE -  Administrator, Civil Service (Medical): No    Lack of Transportation (Non-Medical): No  Physical Activity: Patient Declined (03/28/2022)   Exercise Vital Sign    Days of Exercise per Week: Patient declined    Minutes of Exercise per Session: Patient declined  Stress: No Stress Concern Present (03/28/2022)   [employer] of Occupational Health - Occupational Stress Questionnaire    Feeling of Stress : Not at all  Social Connections: Socially Integrated (03/28/2022)   Social Connection and Isolation Panel    Frequency of Communication with Friends and Family: More than three times a week    Frequency of Social Gatherings with Friends and Family: Once a week    Attends Religious Services: More than 4 times per year    Active Member of Clubs or Organizations: Yes    Attends Banker Meetings: More than 4 times per year    Marital Status: Married  Depression (PHQ2-9): Low Risk (03/28/2022)   Depression (PHQ2-9)    PHQ-2 Score: 0  Alcohol  Screen: Low Risk (03/28/2022)   Alcohol  Screen    Last Alcohol  Screening Score (AUDIT): 0  Housing: Low Risk (03/28/2022)   Housing    Last Housing Risk Score: 0  Utilities: Not on file  Health Literacy: Not on file    Family History  Problem Relation Age of Onset   Hypertension Mother    Hypertension Father    Cancer Son    Colon cancer Neg Hx    Rectal cancer Neg Hx    Stomach cancer Neg Hx    Esophageal cancer Neg Hx    BRCA 1/2 Neg Hx    Breast cancer Neg Hx     Current Medications[1]  Review of Systems  Review of Systems  Constitutional: Negative for fever, chills, weight loss, malaise/fatigue and diaphoresis.  HENT: Negative for hearing loss, ear pain, nosebleeds, congestion, sore throat, neck pain, tinnitus and ear discharge.   Eyes: Negative for blurred vision, double vision, photophobia, pain, discharge and redness.  Respiratory: Negative for cough, hemoptysis, sputum production, shortness of breath,  wheezing and stridor.   Cardiovascular: Negative for chest pain, palpitations, orthopnea, claudication, leg swelling and PND.  Gastrointestinal: negative for abdominal pain. Negative for heartburn, nausea, vomiting, diarrhea, constipation, blood in stool and melena.  Genitourinary: Negative for dysuria, urgency, frequency, hematuria and flank pain.  Musculoskeletal: Negative for myalgias, back pain, joint pain  and falls.  Skin: Negative for itching and rash.  Neurological: Negative for dizziness, tingling, tremors, sensory change, speech change, focal weakness, seizures, loss of consciousness, weakness and headaches.  Endo/Heme/Allergies: Negative for environmental allergies and polydipsia. Does not bruise/bleed easily.  Psychiatric/Behavioral: Negative for depression, suicidal ideas, hallucinations, memory loss and substance abuse. The patient is not nervous/anxious and does not have insomnia.        Objective:  Blood pressure 113/77, pulse 92, height 5' 2 (1.575 m), weight 136 lb (61.7 kg).   Physical Exam  Vitals reviewed. Constitutional: She is oriented to person, place, and time. She appears well-developed and well-nourished.  HENT:  Head: Normocephalic and atraumatic.        Right Ear: External ear normal.  Left Ear: External ear normal.  Nose: Nose normal.  Mouth/Throat: Oropharynx is clear and moist.  Eyes: Conjunctivae and EOM are normal. Pupils are equal, round, and reactive to light. Right eye exhibits no discharge. Left eye exhibits no discharge. No scleral icterus.  Neck: Normal range of motion. Neck supple. No tracheal deviation present. No thyromegaly present.  Cardiovascular: Normal rate, regular rhythm, normal heart sounds and intact distal pulses.  Exam reveals no gallop and no friction rub.   No murmur heard. Respiratory: Effort normal and breath sounds normal. No respiratory distress. She has no wheezes. She has no rales. She exhibits no tenderness.  GI: Soft. Bowel  sounds are normal. She exhibits no distension and no mass. There is no tenderness. There is no rebound and no guarding.  Genitourinary:  Breasts no masses skin changes or nipple changes bilaterally      Vulva is normal without lesions Vagina is pink moist with discharge wet prep + BV Cervix normal in appearance and pap is done Uterus is normal size shape and contour Adnexa is negative with normal sized ovaries   Musculoskeletal: Normal range of motion. She exhibits no edema and no tenderness.  Neurological: She is alert and oriented to person, place, and time. She has normal reflexes. She displays normal reflexes. No cranial nerve deficit. She exhibits normal muscle tone. Coordination normal.  Skin: Skin is warm and dry. No rash noted. No erythema. No pallor.  Psychiatric: She has a normal mood and affect. Her behavior is normal. Judgment and thought content normal.       Medications Ordered at today's visit: Meds ordered this encounter  Medications   metroNIDAZOLE  (METROGEL ) 0.75 % vaginal gel    Sig: Place 1 Applicatorful vaginally at bedtime.    Dispense:  70 g    Refill:  5    Other orders placed at today's visit: No orders of the defined types were placed in this encounter.    ASSESSMENT + PLAN:    ICD-10-CM   1. Well woman exam with routine gynecological exam  Z01.419     2. BV (bacterial vaginosis)  N76.0    B96.89           Return if symptoms worsen or fail to improve.      [1]  Current Outpatient Medications:    albuterol  (VENTOLIN  HFA) 108 (90 Base) MCG/ACT inhaler, INHALE 2 PUFFS INTO THE LUNGS EVERY 6 HOURS AS NEEDED FOR WHEEZING OR SHORT OF BREATH, Disp: 18 each, Rfl: 1   ALPRAZolam  (XANAX ) 0.5 MG tablet, TAKE 1 TABLET BY MOUTH TWICE DAILY AS NEEDED. MAY TAKE ONE MORE TABLET BEFORE FOR ALLERGY SHOTS ONCE A WEEK (30 DAY supply), Disp: 65 tablet, Rfl: 5   aspirin  81 MG  chewable tablet, Chew 81 mg by mouth daily. Take 2 tablets for a total of 162 mg daily,  Disp: , Rfl:    AUVI-Q  0.3 MG/0.3ML SOAJ injection, Use for life threatening allergic reactions, Disp: 4 each, Rfl: 3   azelastine  (OPTIVAR ) 0.05 % ophthalmic solution, Apply 1 drop to eye 2 (two) times daily., Disp: , Rfl:    budesonide  ([employer] BUDESONIDE ) 32 MCG/ACT nasal spray, USE 1-2 SPRAYS INTO BOTH NOSTRILS EVERY DAY, Disp: 8.43 mL, Rfl: 5   budesonide -formoterol  (SYMBICORT ) 80-4.5 MCG/ACT inhaler, INHALE 2 PUFFS INTO THE LUNGS 2 TIMES DAILY AS NEEDED., Disp: 10.2 each, Rfl: 5   cetirizine  (ZYRTEC ) 10 MG tablet, TAKE 1 TABLET (10 MG TOTAL) BY MOUTH 2 (TWO) TIMES DAILY AS NEEDED FOR ALLERGIES., Disp: 180 tablet, Rfl: 1   cromolyn  (OPTICROM ) 4 % ophthalmic solution, Place 1 drop into both eyes 4 (four) times daily. , Disp: , Rfl: 1   cyproheptadine  (PERIACTIN ) 4 MG tablet, TAKE 1/2 TO 1 TABLET BY MOUTH AT BEDTIME, Disp: 90 tablet, Rfl: 1   Diethylpropion  HCl CR 75 MG TB24, Take 1 tablet (75 mg total) by mouth daily., Disp: 30 tablet, Rfl: 5   dorzolamide-timolol (COSOPT) 22.3-6.8 MG/ML ophthalmic solution, Place 1 drop into both eyes 2 (two) times daily., Disp: , Rfl:    ELIQUIS  2.5 MG TABS tablet, Take 2.5 mg by mouth 2 (two) times daily., Disp: , Rfl:    EPINEPHrine  0.3 mg/0.3 mL IJ SOAJ injection, Inject 0.3 mg into the muscle as needed for anaphylaxis., Disp: 2 each, Rfl: 1   gabapentin (NEURONTIN) 100 MG capsule, Take 100 mg by mouth 3 (three) times daily., Disp: , Rfl:    HYDROcodone -acetaminophen  (NORCO) 7.5-325 MG tablet, Take 1 tablet by mouth every 6 (six) hours as needed for moderate pain. (Patient taking differently: Take 1 tablet by mouth every 8 (eight) hours as needed for moderate pain (pain score 4-6). 5-325), Disp: , Rfl:    lubiprostone (AMITIZA) 8 MCG capsule, SMARTSIG:1 Capsule(s) By Mouth 1-4 Times Daily, Disp: , Rfl:    metFORMIN  (GLUCOPHAGE ) 500 MG tablet, TAKE 1 TABLET BY MOUTH EVERY DAY WITH BREAKFAST, Disp: 90 tablet, Rfl: 3   metroNIDAZOLE  (METROGEL ) 0.75 % vaginal gel,  Place 1 Applicatorful vaginally at bedtime., Disp: 70 g, Rfl: 5   montelukast  (SINGULAIR ) 10 MG tablet, TAKE 1 TABLET BY MOUTH EVERYDAY AT BEDTIME, Disp: 30 tablet, Rfl: 3   NUCALA  100 MG/ML SOAJ, INJECT 1 PEN UNDER THE SKIN EVERY 28 DAYS, Disp: 1 mL, Rfl: 11   pantoprazole  (PROTONIX ) 40 MG tablet, TAKE 1 TABLET BY MOUTH TWICE A DAY, Disp: 60 tablet, Rfl: 5   pravastatin  (PRAVACHOL ) 40 MG tablet, Take 1 tablet (40 mg total) by mouth daily. (Patient taking differently: Take 40 mg by mouth 2 (two) times daily.), Disp: 90 tablet, Rfl: 1   progesterone  (PROMETRIUM ) 200 MG capsule, TAKE 1 CAPSULE BY MOUTH EVERYDAY AT BEDTIME, Disp: 30 capsule, Rfl: 2   spironolactone  (ALDACTONE ) 25 MG tablet, Take 25 mg by mouth 2 (two) times daily., Disp: , Rfl:    UNABLE TO FIND, Allergy shots-both arms-weekly, Disp: , Rfl:    cycloSPORINE  (RESTASIS ) 0.05 % ophthalmic emulsion, Place 1 drop into both eyes 2 (two) times daily. (Patient not taking: Reported on 11/05/2024), Disp: , Rfl:    diclofenac  (VOLTAREN ) 0.1 % ophthalmic solution, 1 drop 2 (two) times daily. (Patient not taking: Reported on 11/05/2024), Disp: , Rfl:    pregabalin (LYRICA) 25 MG capsule, Take 50 mg by  mouth 2 (two) times daily. (Patient not taking: Reported on 11/05/2024), Disp: , Rfl:   Current Facility-Administered Medications:    Mepolizumab  SOLR 100 mg, 100 mg, Subcutaneous, Q28 days, Kozlow, Eric J, MD, 100 mg at 10/12/24 1503

## 2024-11-08 ENCOUNTER — Ambulatory Visit

## 2024-11-08 ENCOUNTER — Telehealth: Payer: Self-pay | Admitting: Obstetrics & Gynecology

## 2024-11-08 DIAGNOSIS — J4550 Severe persistent asthma, uncomplicated: Secondary | ICD-10-CM | POA: Diagnosis not present

## 2024-11-08 MED ORDER — METRONIDAZOLE 500 MG PO TABS: 500.0000 mg | ORAL_TABLET | Freq: Two times a day (BID) | ORAL | 0 refills | Status: DC

## 2024-11-08 NOTE — Telephone Encounter (Addendum)
 Pt states the cream that Dr. Jayne prescribed isn't working well with Prep H. Is there something else that she can use for BV? Can you send in the pill form for BV? Please advise. Thanks! JSY

## 2024-11-08 NOTE — Telephone Encounter (Signed)
 Pt would like a call back from the office. Please advise

## 2024-11-08 NOTE — Addendum Note (Signed)
 Addended by: JAYNE VONN DEL on: 11/08/2024 12:28 PM   Modules accepted: Orders

## 2024-11-09 ENCOUNTER — Ambulatory Visit: Payer: Self-pay | Admitting: Obstetrics & Gynecology

## 2024-11-09 LAB — CYTOLOGY - PAP
Comment: NEGATIVE
Diagnosis: NEGATIVE
High risk HPV: NEGATIVE

## 2024-11-16 ENCOUNTER — Ambulatory Visit: Admitting: Allergy and Immunology

## 2024-11-16 ENCOUNTER — Encounter: Payer: Self-pay | Admitting: Allergy and Immunology

## 2024-11-16 ENCOUNTER — Other Ambulatory Visit: Payer: Self-pay

## 2024-11-16 VITALS — BP 110/78 | HR 86 | Temp 99.1°F | Resp 16 | Ht 62.0 in | Wt 137.2 lb

## 2024-11-16 DIAGNOSIS — J301 Allergic rhinitis due to pollen: Secondary | ICD-10-CM | POA: Diagnosis not present

## 2024-11-16 DIAGNOSIS — G500 Trigeminal neuralgia: Secondary | ICD-10-CM

## 2024-11-16 DIAGNOSIS — H6993 Unspecified Eustachian tube disorder, bilateral: Secondary | ICD-10-CM | POA: Diagnosis not present

## 2024-11-16 DIAGNOSIS — J3089 Other allergic rhinitis: Secondary | ICD-10-CM

## 2024-11-16 DIAGNOSIS — J4550 Severe persistent asthma, uncomplicated: Secondary | ICD-10-CM | POA: Diagnosis not present

## 2024-11-16 DIAGNOSIS — H1010 Acute atopic conjunctivitis, unspecified eye: Secondary | ICD-10-CM | POA: Diagnosis not present

## 2024-11-16 DIAGNOSIS — J309 Allergic rhinitis, unspecified: Secondary | ICD-10-CM

## 2024-11-16 DIAGNOSIS — H1044 Vernal conjunctivitis: Secondary | ICD-10-CM

## 2024-11-16 DIAGNOSIS — K219 Gastro-esophageal reflux disease without esophagitis: Secondary | ICD-10-CM

## 2024-11-16 MED ORDER — MONTELUKAST SODIUM 10 MG PO TABS: ORAL_TABLET | ORAL | 3 refills | Status: DC

## 2024-11-16 MED ORDER — CYPROHEPTADINE HCL 4 MG PO TABS: 2.0000 mg | ORAL_TABLET | Freq: Every day | ORAL | 1 refills | Status: AC

## 2024-11-16 MED ORDER — BUDESONIDE-FORMOTEROL FUMARATE 80-4.5 MCG/ACT IN AERO: 2.0000 | INHALATION_SPRAY | Freq: Two times a day (BID) | RESPIRATORY_TRACT | 5 refills | Status: DC | PRN

## 2024-11-16 MED ORDER — METHYLPREDNISOLONE ACETATE 80 MG/ML IJ SUSP
80.0000 mg | Freq: Once | INTRAMUSCULAR | Status: AC
  Administered 2024-11-16: 80 mg via INTRAMUSCULAR

## 2024-11-16 MED ORDER — PANTOPRAZOLE SODIUM 40 MG PO TBEC: 40.0000 mg | DELAYED_RELEASE_TABLET | Freq: Two times a day (BID) | ORAL | 5 refills | Status: DC

## 2024-11-16 NOTE — Progress Notes (Unsigned)
 "  [place] [facility] [place] [facility] [place] [facility] [place]   Follow-up Note  Referring Provider: Renato Dorothey HERO, NP Primary Provider: Renato Dorothey HERO, NP Date of Office Visit: 11/16/2024  Subjective:   Jennifer Mullins (DOB: [birth year]) is a 53 y.o. female who [facility] in re-evaluation of the following:  HPI: Jennifer Mullins returns to this clinic in evaluation of asthma, allergic rhinoconjunctivitis, history of vernal conjunctivitis, history of facial pain syndrome, dry eye syndrome, LPR.  I last saw her in this clinic 09 March 2024.  Her asthma has not really been causing her much of her problem and she rarely uses a short acting bronchodilator and she has been using her Symbicort  mostly 1 time per day and also continues to use her Mepolizumab  injections every 4 weeks.  And her nasal issue has been under very good control while using montelukast  and Rhinocort  on a consistent basis and also continuing immunotherapy currently at every 4 weeks.  She was exposed to her hairdresser about a week ago who was coughing.  It is her ears that are driving her crazy.  She believes that her hearing is not as good and as noted she has had some dizziness recently.  She did see ENT in the past for ETD but that has been many years ago.  Her Periactin  use is very intermittent and is only used when she has some problems with facial pain.  She does not use this on a consistent basis.  Her reflux is under very good control with the use of pantoprazole  twice a day.  She continues to address the issue of her eye issue with an ophthalmologist.  Allergies as of 11/16/2024       Reactions   Demerol [meperidine] Shortness Of Breath   FLUSHING AND SHORTNESS OF BREATH   Morphine  And Codeine  Shortness Of Breath   Flushed and hot hyper Flushed and hot hyper Flushed and hot hyper Flushed and hot hyper   Other Shortness Of Breath   Flushed and hot hyper Flushed and  hot hyper Flushed and hot hyper Flushed and hot hyper Flushed and hot hyper   Cardizem  [diltiazem Hcl] Other (See Comments)   Diltiazem Hcl Hives   Gabapentin Other (See Comments)   Chest pain and elevated heart rate   Lisinopril     Caused cough   Lyrica [pregabalin]    Chest pain, elevated heart rate, joint swelling, stomach swelling and severe constipation   Tramadol  Other (See Comments)   Tears up stomach        Medication List    albuterol  108 (90 Base) MCG/ACT inhaler Commonly known as: VENTOLIN  HFA INHALE 2 PUFFS INTO THE LUNGS EVERY 6 HOURS AS NEEDED FOR WHEEZING OR SHORT OF BREATH   ALPRAZolam  0.5 MG tablet Commonly known as: XANAX  TAKE 1 TABLET BY MOUTH TWICE DAILY AS NEEDED. MAY TAKE ONE MORE TABLET BEFORE FOR ALLERGY SHOTS ONCE A WEEK (30 DAY supply)   aspirin  81 MG chewable tablet Chew 81 mg by mouth daily. Take 2 tablets for a total of 162 mg daily   azelastine  0.05 % ophthalmic solution Commonly known as: OPTIVAR  Apply 1 drop to eye 2 (two) times daily.   budesonide  32 MCG/ACT nasal spray Commonly known as: [employer] Budesonide  USE 1-2 SPRAYS INTO BOTH NOSTRILS EVERY DAY   budesonide -formoterol  80-4.5 MCG/ACT inhaler Commonly known as: SYMBICORT  INHALE 2 PUFFS INTO THE LUNGS 2 TIMES DAILY AS NEEDED.   cetirizine   10 MG tablet Commonly known as: ZYRTEC  TAKE 1 TABLET (10 MG TOTAL) BY MOUTH 2 (TWO) TIMES DAILY AS NEEDED FOR ALLERGIES.   cromolyn  4 % ophthalmic solution Commonly known as: OPTICROM  Place 1 drop into both eyes 4 (four) times daily.   cycloSPORINE  0.05 % ophthalmic emulsion Commonly known as: RESTASIS  Place 1 drop into both eyes 2 (two) times daily.   cyproheptadine  4 MG tablet Commonly known as: PERIACTIN  TAKE 1/2 TO 1 TABLET BY MOUTH AT BEDTIME   diclofenac  0.1 % ophthalmic solution Commonly known as: VOLTAREN  1 drop 2 (two) times daily.   diclofenac  Sodium 1 % Gel Commonly known as: VOLTAREN  Apply 2 g topically 4 (four) times  daily.   Diethylpropion  HCl CR 75 MG Tb24 Take 1 tablet (75 mg total) by mouth daily.   dorzolamide-timolol 2-0.5 % ophthalmic solution Commonly known as: COSOPT Place 1 drop into both eyes 2 (two) times daily.   Eliquis  2.5 MG Tabs tablet Generic drug: apixaban  Take 2.5 mg by mouth 2 (two) times daily.   EPINEPHrine  0.3 mg/0.3 mL Soaj injection Commonly known as: EPI-PEN Inject 0.3 mg into the muscle as needed for anaphylaxis.   Auvi-Q  0.3 MG/0.3ML Soaj injection Generic drug: EPINEPHrine  Use for life threatening allergic reactions   gabapentin 100 MG capsule Commonly known as: NEURONTIN Take 100 mg by mouth 3 (three) times daily. What changed: additional instructions   HYDROcodone -acetaminophen  7.5-325 MG tablet Commonly known as: NORCO Take 1 tablet by mouth every 6 (six) hours as needed for moderate pain. What changed: additional instructions   lubiprostone 8 MCG capsule Commonly known as: AMITIZA SMARTSIG:1 Capsule(s) By Mouth 1-4 Times Daily   metFORMIN  500 MG tablet Commonly known as: GLUCOPHAGE  TAKE 1 TABLET BY MOUTH EVERY DAY WITH BREAKFAST   metroNIDAZOLE  0.75 % vaginal gel Commonly known as: METROGEL  Place 1 Applicatorful vaginally at bedtime.   metroNIDAZOLE  500 MG tablet Commonly known as: FLAGYL  Take 1 tablet (500 mg total) by mouth 2 (two) times daily.   montelukast  10 MG tablet Commonly known as: SINGULAIR  TAKE 1 TABLET BY MOUTH EVERYDAY AT BEDTIME   Nucala  100 MG/ML Soaj Generic drug: Mepolizumab  INJECT 1 PEN UNDER THE SKIN EVERY 28 DAYS   pantoprazole  40 MG tablet Commonly known as: PROTONIX  TAKE 1 TABLET BY MOUTH TWICE A DAY   pravastatin  40 MG tablet Commonly known as: PRAVACHOL  Take 1 tablet (40 mg total) by mouth daily. What changed: when to take this   pregabalin 25 MG capsule Commonly known as: LYRICA Take 50 mg by mouth 2 (two) times daily.   progesterone  200 MG capsule Commonly known as: PROMETRIUM  TAKE 1 CAPSULE BY MOUTH  EVERYDAY AT BEDTIME   spironolactone  25 MG tablet Commonly known as: ALDACTONE  Take 25 mg by mouth 2 (two) times daily.   UNABLE TO FIND Allergy shots-both arms-weekly    Past Medical History:  Diagnosis Date   Anxiety    Arm vein blood clot, left    Arthritis    Asthma    Bilateral calcaneal spurs    Bilateral polycystic ovarian syndrome    Bulging lumbar disc    Carpal tunnel syndrome, bilateral    COPD (chronic obstructive pulmonary disease) (HCC)    BRONCHITIS   Diabetes mellitus without complication (HCC)    TYPE 2   DX  4-5 YRS AGO   ETD (Eustachian tube dysfunction), bilateral    left worse than right   Fibromyalgia    GAD (generalized anxiety disorder)    GERD (  gastroesophageal reflux disease)    TAKES PRESCRIPTION MEDS   Glaucoma    BOTH EYES   Hyperlipidemia    Hypertension    Hypothyroidism    NODULES ON THYROID    PONV (postoperative nausea and vomiting)    Pulmonary embolism (HCC)    Tachycardia    TMJ (dislocation of temporomandibular joint)    Trigger finger    Trigger finger of left thumb     Past Surgical History:  Procedure Laterality Date   ANTERIOR CERVICAL DECOMP/DISCECTOMY FUSION N/A 11/13/2016   Procedure: Cervical five-six, Cervical six-seven Anterior Cervical Discectomy and Fusion, Allograft, Plate;  Surgeon: Oneil JAYSON Herald, MD;  Location: [facility];  Service: Orthopedics;  Laterality: N/A;   BREAST SURGERY     CARPAL TUNNEL RELEASE     DILATION AND CURETTAGE OF UTERUS     EXPLORATORY LAPAROTOMY     LIPOMA EXCISION Left 06/15/2018   Procedure: LEFT CALF LIPOMA EXCISION;  Surgeon: Herald Oneil JAYSON, MD;  Location: [facility];  Service: Orthopedics;  Laterality: Left;   NASAL SINUS SURGERY  07/22/2013   Dr. Tanda in [place]   plate and six screws in back     REDUCTION MAMMAPLASTY Bilateral 2000   TRIGGER FINGER RELEASE Right 06/15/2018   Procedure: RIGHT TRIGGER THUMB RELEASE;  Surgeon: Herald Oneil JAYSON, MD;  Location: [facility];  Service: Orthopedics;  Laterality: Right;    Review of systems negative except as noted in HPI / PMHx or noted below:  ROS   Objective:   Vitals:   11/16/24 1611  BP: 110/78  Pulse: 86  Resp: 16  Temp: 99.1 F (37.3 C)  SpO2: 97%   Height: 5' 2 (157.5 cm)  Weight: 137 lb 3.2 oz (62.2 kg)   Physical Exam Constitutional:      Appearance: She is not diaphoretic.  HENT:     Head: Normocephalic.     Right Ear: Ear canal and external ear normal. A middle ear effusion is present.     Left Ear: Ear canal and external ear normal. A middle ear effusion is present. Tympanic membrane has decreased mobility.     Nose: Nose normal. No mucosal edema or rhinorrhea.     Mouth/Throat:     Pharynx: Uvula midline. No oropharyngeal exudate.  Eyes:     Conjunctiva/sclera: Conjunctivae normal.  Neck:     Thyroid : No thyromegaly.     Trachea: Trachea normal. No tracheal tenderness or tracheal deviation.  Cardiovascular:     Rate and Rhythm: Normal rate and regular rhythm.     Heart sounds: Normal heart sounds, S1 normal and S2 normal. No murmur heard. Pulmonary:     Effort: No respiratory distress.     Breath sounds: Normal breath sounds. No stridor. No wheezing or rales.  Lymphadenopathy:     Head:     Right side of head: No tonsillar adenopathy.     Left side of head: No tonsillar adenopathy.     Cervical: No cervical adenopathy.  Skin:    Findings: No erythema or rash.     Nails: There is no clubbing.  Neurological:     Mental Status: She is alert.     Diagnostics: Spirometry was performed and demonstrated an FEV1 of 1.61 at 77 % of predicted.  Assessment and Plan:   1. Severe persistent asthma without complication (HCC)   2. LPRD (laryngopharyngeal reflux disease)   3. Allergic rhinitis, unspecified seasonality, unspecified trigger   4. Facial  pain syndrome    1. Continue to Treat and prevent inflammation:   A. OTC Budesonide / Rhinocort  - 1-2 sprays  each nostril daily  B. Symbicort  80 - 2 inhalations 1-2 times per day  C. montelukast  10 mg - one tablet one time per day  D. Mepolizumab  injections every 4 weeks  E. Immunotherapy at maintenance 0.25 ml Red vial   F. Use a extra dose of Zyrtec  1 hour before immunotherapy injection  2. Continue to Treat and prevent reflux:   A.  Pantoprazole  40 mg twice a day  3.  Continue to treat and prevent facial pain syndrome:   A. Periactin  4 mg - 1/2 - 1 tablet at bedtime  4. For this recent event:   A. Check home nasal swab for flu / covid (Tamiflu / Covid ???)  B. Depomedrol 80 mg IM delivered in clinic today  C. May need to revisit with ENT if no improvement  5. If needed:   A. OTC antihistamine - Zyrtec  10mg  1-2 times per day.   B. Symbicort  80 - 2 inhalations every 6 hours   C. Epi-Pen / Auvi-Q   D. Eye treatment directed by eye doctor  6. Return to clinic in 6 months or earlier if problem  Jennifer Mullins was doing very well but she appears to have developed some problems with the ETD following an infection that started in November.  And unfortunately she appears to be acutely infected now with some type of viral organism that has made her ETD much worse as well as given her some upper airway symptoms.  Will treat her with a systemic steroid to address the inflammation of this event and hopefully to get her eustachian tubes on swollen.  If we cannot clear out her ear issue will refer her back to ENT for further management of this condition.  Prior to this event she was doing relatively well regarding her asthma and her nasal issue and her reflux on her current therapy as noted above and she will maintain the plan noted above and assuming she does well we will see her back in this clinic in 6 months or earlier if there is a problem.  Camellia Denis, MD Allergy / Immunology [facility] "

## 2024-11-16 NOTE — Patient Instructions (Addendum)
" °  1. Continue to Treat and prevent inflammation:   A. OTC Budesonide / Rhinocort  - 1-2 sprays each nostril daily  B. Symbicort  80 - 2 inhalations 1-2 times per day  C. montelukast  10 mg - one tablet one time per day  D. Mepolizumab  injections every 4 weeks  E. Immunotherapy at maintenance 0.25 ml Red vial   F. Use a extra dose of Zyrtec  1 hour before immunotherapy injection  2. Continue to Treat and prevent reflux:   A.  Pantoprazole  40 mg twice a day  3.  Continue to treat and prevent facial pain syndrome:   A. Periactin  4 mg - 1/2 - 1 tablet at bedtime  4. For this recent event:   A. Check home nasal swab for flu / covid (Tamiflu / Covid ???)  B. Depomedrol 80 mg IM delivered in clinic today  C. May need to revisit with ENT if no improvement  5. If needed:   A. OTC antihistamine - Zyrtec  10mg  1-2 times per day.   B. Symbicort  80 - 2 inhalations every 6 hours   C. Epi-Pen / Auvi-Q   D. Eye treatment directed by eye doctor  6. Return to clinic in 6 months or earlier if problem             "

## 2024-11-17 ENCOUNTER — Ambulatory Visit
Admission: EM | Admit: 2024-11-17 | Discharge: 2024-11-17 | Disposition: A | Discharge status: Home / Self Care | Source: Home / Self Care

## 2024-11-17 ENCOUNTER — Encounter: Payer: Self-pay | Admitting: Allergy and Immunology

## 2024-11-17 DIAGNOSIS — H6993 Unspecified Eustachian tube disorder, bilateral: Secondary | ICD-10-CM | POA: Diagnosis not present

## 2024-11-17 DIAGNOSIS — J3089 Other allergic rhinitis: Secondary | ICD-10-CM | POA: Diagnosis not present

## 2024-11-17 LAB — POC COVID19/FLU A&B COMBO
Covid Antigen, POC: NEGATIVE
Influenza A Antigen, POC: NEGATIVE
Influenza B Antigen, POC: NEGATIVE

## 2024-11-17 MED ORDER — AZELASTINE HCL 0.1 % NA SOLN: 1.0000 | Freq: Two times a day (BID) | NASAL | 0 refills | Status: AC

## 2024-11-17 MED ORDER — DEXAMETHASONE SOD PHOSPHATE PF 10 MG/ML IJ SOLN
10.0000 mg | Freq: Once | INTRAMUSCULAR | Status: AC
  Administered 2024-11-17: 10 mg via INTRAMUSCULAR

## 2024-11-17 NOTE — Discharge Instructions (Addendum)
 Continue your antihistamine daily, steroid nasal spray twice daily and add Astelin  nasal spray twice daily additionally to help with sinus pressure and fluid pressure in the ears.  I have sent this over to your pharmacy.  We have also given you a steroid shot that is a bit more fast-acting than the one that you received yesterday to see if this helps with more immediate relief.  Follow-up with ENT if still no benefit after these measures.

## 2024-11-17 NOTE — ED Provider Notes (Signed)
 " [facility]    CSN: [id number] Arrival date & time: 11/17/24  1011      History   Chief Complaint No chief complaint on file.   HPI Jennifer Mullins is a 52 y.o. female.   Patient presenting today with several week history of ongoing sinus pressure, rhinorrhea, ear pressure and fullness.  Denies fever, chills, cough, chest pain, shortness of breath, vomiting, diarrhea.  History of seasonal allergies and asthma followed by allergy specialist currently on immunotherapy injections, Zyrtec , budesonide  nasal spray.  Was given an IM Depo-Medrol  injection yesterday with so far no benefit.  States the ear pressure and fullness is significantly bothersome to her.    Past Medical History:  Diagnosis Date   Anxiety    Arm vein blood clot, left    Arthritis    Asthma    Bilateral calcaneal spurs    Bilateral polycystic ovarian syndrome    Bulging lumbar disc    Carpal tunnel syndrome, bilateral    COPD (chronic obstructive pulmonary disease) (HCC)    BRONCHITIS   Diabetes mellitus without complication (HCC)    TYPE 2   DX  4-5 YRS AGO   ETD (Eustachian tube dysfunction), bilateral    left worse than right   Fibromyalgia    GAD (generalized anxiety disorder)    GERD (gastroesophageal reflux disease)    TAKES PRESCRIPTION MEDS   Glaucoma    BOTH EYES   Hyperlipidemia    Hypertension    Hypothyroidism    NODULES ON THYROID    PONV (postoperative nausea and vomiting)    Pulmonary embolism (HCC)    Tachycardia    TMJ (dislocation of temporomandibular joint)    Trigger finger    Trigger finger of left thumb     Patient Active Problem List   Diagnosis Date Noted   Chronic anxiety 05/10/2021   Chronic constipation 05/10/2021   Diabetes mellitus without complication (HCC) 05/10/2021   Displacement of lumbar intervertebral disc without myelopathy 05/10/2021   History of arthrodesis 05/10/2021   Uterine fibroid 05/10/2021   Bulging lumbar disc 02/16/2020   Primary  insomnia 02/10/2020   Numbness of left hand 07/06/2019   Rectal bleeding 02/01/2019   Acne vulgaris 02/01/2019   Hirsutism 01/29/2019   Hypokalemia 01/13/2019   Acute blood loss anemia 01/13/2019   Symptomatic anemia    TMJ (dislocation of temporomandibular joint) 08/24/2018   Fibromyalgia 07/09/2018   Trigger thumb, right thumb 06/15/2018   Lipoma of lower extremity 06/15/2018   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   Dyspareunia, female 12/11/2017   Menometrorrhagia 12/11/2017   Pain in right ankle and joints of right foot 10/29/2017   Chronic pain of right knee 10/29/2017   Iron  deficiency anemia due to chronic blood loss 07/07/2017   S/P cervical spinal fusion 02/27/2017   Dysfunctional uterine bleeding 11/24/2016   Anticoagulated 11/24/2016   Allergic rhinoconjunctivitis 07/28/2015   Chronic headache 07/28/2015   PCOS (polycystic ovarian syndrome) 06/20/2015   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   Hyperlipidemia 06/20/2015   GAD (generalized anxiety disorder) 05/18/2014   GERD (gastroesophageal reflux disease) 05/18/2014   Chronic sinusitis 07/02/2013   Extrinsic asthma 05/20/2013   HTN (hypertension) 05/20/2013    Past Surgical History:  Procedure Laterality Date   ANTERIOR CERVICAL DECOMP/DISCECTOMY FUSION N/A 11/13/2016   Procedure: Cervical five-six, Cervical six-seven Anterior Cervical Discectomy and Fusion, Allograft, Plate;  Surgeon: Oneil JAYSON Herald, MD;  Location: [facility];  Service: Orthopedics;  Laterality: N/A;   BREAST SURGERY  CARPAL TUNNEL RELEASE     DILATION AND CURETTAGE OF UTERUS     EXPLORATORY LAPAROTOMY     LIPOMA EXCISION Left 06/15/2018   Procedure: LEFT CALF LIPOMA EXCISION;  Surgeon: Barbarann Oneil BROCKS, MD;  Location: [facility];  Service: Orthopedics;  Laterality: Left;   NASAL SINUS SURGERY  07/22/2013   Dr. Tanda in [place]   plate and six screws in back     REDUCTION MAMMAPLASTY Bilateral 2000   TRIGGER FINGER RELEASE Right  06/15/2018   Procedure: RIGHT TRIGGER THUMB RELEASE;  Surgeon: Barbarann Oneil BROCKS, MD;  Location: [facility];  Service: Orthopedics;  Laterality: Right;    OB History     Gravida  1   Para  1   Term      Preterm  1   AB      Living  1      SAB      IAB      Ectopic      Multiple      Live Births  1            Home Medications    Prior to Admission medications  Medication Sig Start Date End Date Taking? Authorizing Provider  azelastine  (ASTELIN ) 0.1 % nasal spray Place 1 spray into both nostrils 2 (two) times daily. Use in each nostril as directed 11/17/24  Yes Stuart Vernell Norris, PA-C  gabapentin (NEURONTIN) 100 MG capsule 2 capsules 2 (two) times daily. 11/04/24  Yes [provider]  HYDROcodone -acetaminophen  (NORCO/VICODIN) 5-325 MG tablet Take 1 tablet by mouth every 8 (eight) hours as needed. 07/22/24  Yes [provider]  ALPRAZolam  (XANAX ) 0.5 MG tablet TAKE 1 TABLET BY MOUTH TWICE DAILY AS NEEDED. MAY TAKE ONE MORE TABLET BEFORE FOR ALLERGY SHOTS ONCE A WEEK (30 DAY supply) 01/05/20   Joshua Clayborne RAMAN, PA-C  aspirin  81 MG chewable tablet Chew 81 mg by mouth daily. Take 2 tablets for a total of 162 mg daily    [provider]  AUVI-Q  0.3 MG/0.3ML SOAJ injection Use for life threatening allergic reactions 03/09/24   Kozlow, Camellia PARAS, MD  budesonide  ([facility] BUDESONIDE ) 32 MCG/ACT nasal spray USE 1-2 SPRAYS INTO BOTH NOSTRILS EVERY DAY 03/09/24   Kozlow, Camellia PARAS, MD  budesonide -formoterol  (SYMBICORT ) 80-4.5 MCG/ACT inhaler Inhale 2 puffs into the lungs 2 (two) times daily as needed. 11/16/24   Kozlow, Camellia PARAS, MD  cetirizine  (ZYRTEC ) 10 MG tablet TAKE 1 TABLET (10 MG TOTAL) BY MOUTH 2 (TWO) TIMES DAILY AS NEEDED FOR ALLERGIES. 09/14/24   Kozlow, Camellia PARAS, MD  cromolyn  (OPTICROM ) 4 % ophthalmic solution Place 1 drop into both eyes 4 (four) times daily.  03/20/17   [provider]  cyproheptadine  (PERIACTIN ) 4 MG tablet Take 0.5-1  tablets (2-4 mg total) by mouth at bedtime. 11/16/24   Kozlow, Camellia PARAS, MD  diclofenac  Sodium (VOLTAREN ) 1 % GEL Apply 2 g topically 4 (four) times daily. 09/28/24   [provider]  Diethylpropion  HCl CR 75 MG TB24 Take 1 tablet (75 mg total) by mouth daily. 01/05/20   Joshua Clayborne RAMAN, PA-C  dorzolamide-timolol (COSOPT) 22.3-6.8 MG/ML ophthalmic solution Place 1 drop into both eyes 2 (two) times daily. 03/26/22   [provider]  ELIQUIS  2.5 MG TABS tablet Take 2.5 mg by mouth 2 (two) times daily. 11/12/21   [provider]  EPINEPHrine  0.3 mg/0.3 mL IJ SOAJ injection Inject 0.3 mg into the muscle as needed for  anaphylaxis. 02/09/24   Kozlow, Camellia PARAS, MD  lubiprostone (AMITIZA) 8 MCG capsule SMARTSIG:1 Capsule(s) By Mouth 1-4 Times Daily 02/26/22   [provider]  metFORMIN  (GLUCOPHAGE ) 500 MG tablet TAKE 1 TABLET BY MOUTH EVERY DAY WITH BREAKFAST 07/05/19   Joshua Clayborne RAMAN, PA-C  montelukast  (SINGULAIR ) 10 MG tablet TAKE 1 TABLET BY MOUTH EVERYDAY AT BEDTIME 11/16/24   Kozlow, Camellia PARAS, MD  NUCALA  100 MG/ML SOAJ INJECT 1 PEN UNDER THE SKIN EVERY 28 DAYS 05/26/24   Kozlow, Camellia PARAS, MD  pantoprazole  (PROTONIX ) 40 MG tablet Take 1 tablet (40 mg total) by mouth 2 (two) times daily. 11/16/24   Kozlow, Camellia PARAS, MD  pravastatin  (PRAVACHOL ) 40 MG tablet Take 1 tablet (40 mg total) by mouth daily. Patient taking differently: Take 40 mg by mouth 2 (two) times daily. 02/04/20   Dettinger, Fonda LABOR, MD  progesterone  (PROMETRIUM ) 200 MG capsule TAKE 1 CAPSULE BY MOUTH EVERYDAY AT BEDTIME 09/19/24   Jayne Vonn DEL, MD  spironolactone  (ALDACTONE ) 25 MG tablet Take 25 mg by mouth 2 (two) times daily. 02/23/24   [provider]  UNABLE TO FIND Allergy shots-both arms-weekly    [provider]    Family History Family History  Problem Relation Age of Onset   Hypertension Mother    Hypertension Father    Cancer Son    Colon cancer Neg Hx    Rectal cancer Neg Hx    Stomach  cancer Neg Hx    Esophageal cancer Neg Hx    BRCA 1/2 Neg Hx    Breast cancer Neg Hx     Social History Social History[1]   Allergies   Demerol [meperidine], Morphine  and codeine , Other, Cardizem  [diltiazem hcl], Diltiazem hcl, Lisinopril , Lyrica [pregabalin], and Tramadol    Review of Systems Review of Systems Per HPI  Physical Exam Triage Vital Signs ED Triage Vitals  Encounter Vitals Group     BP 11/17/24 1019 130/83     Girls Systolic BP Percentile --      Girls Diastolic BP Percentile --      Boys Systolic BP Percentile --      Boys Diastolic BP Percentile --      Pulse Rate 11/17/24 1019 97     Resp 11/17/24 1019 20     Temp 11/17/24 1019 98.4 F (36.9 C)     Temp Source 11/17/24 1019 Oral     SpO2 11/17/24 1019 97 %     Weight --      Height --      Head Circumference --      Peak Flow --      Pain Score 11/17/24 1022 0     Pain Loc --      Pain Education --      Exclude from Growth Chart --    No data found.  Updated Vital Signs BP 130/83 (BP Location: Right Arm)   Pulse 97   Temp 98.4 F (36.9 C) (Oral)   Resp 20   SpO2 97%   Visual Acuity Right Eye Distance:   Left Eye Distance:   Bilateral Distance:    Right Eye Near:   Left Eye Near:    Bilateral Near:     Physical Exam Vitals and nursing note reviewed.  Constitutional:      Appearance: Normal appearance.  HENT:     Head: Atraumatic.     Right Ear: External ear normal.     Left Ear: External ear  normal.     Ears:     Comments: Bilateral middle ear effusions    Nose: Rhinorrhea present.     Mouth/Throat:     Mouth: Mucous membranes are moist.     Pharynx: No posterior oropharyngeal erythema.  Eyes:     Extraocular Movements: Extraocular movements intact.     Conjunctiva/sclera: Conjunctivae normal.  Cardiovascular:     Rate and Rhythm: Normal rate and regular rhythm.     Heart sounds: Normal heart sounds.  Pulmonary:     Effort: Pulmonary effort is normal.     Breath  sounds: Normal breath sounds. No wheezing or rales.  Musculoskeletal:        General: Normal range of motion.     Cervical back: Normal range of motion and neck supple.  Skin:    General: Skin is warm and dry.  Neurological:     Mental Status: She is alert and oriented to person, place, and time.  Psychiatric:        Mood and Affect: Mood normal.        Thought Content: Thought content normal.      UC Treatments / Results  Labs (all labs ordered are listed, but only abnormal results are displayed) Labs Reviewed  POC COVID19/FLU A&B COMBO    EKG   Radiology No results found.  Procedures Procedures (including critical care time)  Medications Ordered in UC Medications  dexamethasone  (DECADRON ) injection 10 mg (10 mg Intramuscular Given 11/17/24 1132)    Initial Impression / Assessment and Plan / UC Course  I have reviewed the triage vital signs and the nursing notes.  Pertinent labs & imaging results that were available during my care of the patient were reviewed by me and considered in my medical decision making (see chart for details).     Rapid flu and COVID-negative, performed per patient request.  Suspect ongoing seasonal allergy and eustachian tube dysfunction.  Will add IM Decadron  for more immediate action and add Astelin  to her antihistamine and steroid nasal spray regimen.  Discussed ENT follow-up if not resolving.  Final Clinical Impressions(s) / UC Diagnoses   Final diagnoses:  Seasonal allergic rhinitis due to other allergic trigger  ETD (Eustachian tube dysfunction), bilateral     Discharge Instructions      Continue your antihistamine daily, steroid nasal spray twice daily and add Astelin  nasal spray twice daily additionally to help with sinus pressure and fluid pressure in the ears.  I have sent this over to your pharmacy.  We have also given you a steroid shot that is a bit more fast-acting than the one that you received yesterday to see if this  helps with more immediate relief.  Follow-up with ENT if still no benefit after these measures.    ED Prescriptions     Medication Sig Dispense Auth. Provider   azelastine  (ASTELIN ) 0.1 % nasal spray Place 1 spray into both nostrils 2 (two) times daily. Use in each nostril as directed 30 mL Stuart Vernell Norris, PA-C      PDMP not reviewed this encounter.    [1]  Social History Tobacco Use   Smoking status: Former    Current packs/day: 0.00    Average packs/day: 0.3 packs/day for 5.0 years (1.3 ttl pk-yrs)    Types: Cigarettes    Start date: 11/18/1984    Quit date: 11/18/1989    Years since quitting: 35.0    Passive exposure: Never   Smokeless tobacco: Never  Vaping Use  Vaping status: Never Used  Substance Use Topics   Alcohol  use: No   Drug use: No     Stuart Vernell Norris, PA-C 11/17/24 1142  "

## 2024-11-17 NOTE — ED Triage Notes (Signed)
 Pt reports ear fullness x 2 weeks.  headache congestion and facial pain x 4 days

## 2024-11-22 ENCOUNTER — Telehealth: Payer: Self-pay | Admitting: Allergy and Immunology

## 2024-11-22 NOTE — Telephone Encounter (Signed)
 PT called to advise Kozlow of the following:  1) got Covid/Flu shots (as did husband)  2) had Urgent Care trip on 12/31, due to fluid still being around the ears  3) PT would like a call back to discuss options/next steps

## 2024-11-29 ENCOUNTER — Encounter: Payer: Self-pay | Admitting: Hematology

## 2024-11-30 ENCOUNTER — Ambulatory Visit

## 2024-11-30 ENCOUNTER — Encounter: Payer: Self-pay | Admitting: Hematology

## 2024-11-30 DIAGNOSIS — J3089 Other allergic rhinitis: Secondary | ICD-10-CM

## 2024-11-30 DIAGNOSIS — J302 Other seasonal allergic rhinitis: Secondary | ICD-10-CM | POA: Diagnosis not present

## 2024-12-01 ENCOUNTER — Encounter: Payer: Self-pay | Admitting: Hematology

## 2024-12-01 ENCOUNTER — Encounter (INDEPENDENT_AMBULATORY_CARE_PROVIDER_SITE_OTHER): Payer: Self-pay

## 2024-12-02 ENCOUNTER — Other Ambulatory Visit: Payer: Self-pay

## 2024-12-02 ENCOUNTER — Telehealth: Payer: Self-pay | Admitting: *Deleted

## 2024-12-02 ENCOUNTER — Other Ambulatory Visit (HOSPITAL_COMMUNITY): Payer: Self-pay

## 2024-12-02 ENCOUNTER — Encounter: Payer: Self-pay | Admitting: Hematology

## 2024-12-02 MED ORDER — NUCALA 100 MG/ML ~~LOC~~ SOAJ
100.0000 mg | SUBCUTANEOUS | 11 refills | Status: AC
  Filled 2024-12-02: qty 1, 28d supply, fill #0

## 2024-12-02 NOTE — Progress Notes (Signed)
 Specialty Pharmacy Initiation Note   Jennifer Mullins is a 54 y.o. female who will be followed by the specialty pharmacy service for RxSp Asthma/COPD    Review of administration, indication, effectiveness, safety, potential side effects, storage/disposable, and missed dose instructions occurred today for patient's specialty medication(s) Mepolizumab  (Nucala )     Patient/Caregiver did not have any additional questions or concerns.   Patient's therapy is appropriate to: Continue (Patient already established on therapy, transferring pharmacies.)    Goals Addressed             This Visit's Progress    Reduce disease symptoms including coughing and shortness of breath       Patient is on track. Patient will maintain adherence. Patient already established on therapy, transferring pharmacies.          Spencer Peterkin M Yaiden Yang Specialty Pharmacist

## 2024-12-02 NOTE — Progress Notes (Signed)
 Specialty Pharmacy Initial Fill Coordination Note  Jennifer Mullins is a 54 y.o. female contacted today regarding initial fill of specialty medication(s) Mepolizumab  (Nucala )   Patient requested Courier to Provider Office   Delivery date: 12/07/24   Verified address: [address] [place] [address]   Medication will be filled on: 12/06/24   Patient is aware of $0.00 copayment.

## 2024-12-02 NOTE — Telephone Encounter (Signed)
 Called patientt and advised approval with new Ins and submit to [place] with copay card info

## 2024-12-03 ENCOUNTER — Other Ambulatory Visit: Payer: Self-pay

## 2024-12-03 ENCOUNTER — Encounter: Payer: Self-pay | Admitting: Hematology

## 2024-12-06 ENCOUNTER — Other Ambulatory Visit: Payer: Self-pay

## 2024-12-08 ENCOUNTER — Ambulatory Visit

## 2024-12-08 ENCOUNTER — Encounter (INDEPENDENT_AMBULATORY_CARE_PROVIDER_SITE_OTHER): Payer: Self-pay | Admitting: Otolaryngology

## 2024-12-08 ENCOUNTER — Ambulatory Visit (INDEPENDENT_AMBULATORY_CARE_PROVIDER_SITE_OTHER): Admitting: Otolaryngology

## 2024-12-08 ENCOUNTER — Encounter: Payer: Self-pay | Admitting: Hematology

## 2024-12-08 VITALS — BP 106/71 | HR 78 | Ht 62.0 in | Wt 136.0 lb

## 2024-12-08 DIAGNOSIS — H6983 Other specified disorders of Eustachian tube, bilateral: Secondary | ICD-10-CM

## 2024-12-08 DIAGNOSIS — H6523 Chronic serous otitis media, bilateral: Secondary | ICD-10-CM | POA: Diagnosis not present

## 2024-12-08 DIAGNOSIS — J343 Hypertrophy of nasal turbinates: Secondary | ICD-10-CM

## 2024-12-08 DIAGNOSIS — J309 Allergic rhinitis, unspecified: Secondary | ICD-10-CM | POA: Diagnosis not present

## 2024-12-08 DIAGNOSIS — J4550 Severe persistent asthma, uncomplicated: Secondary | ICD-10-CM

## 2024-12-08 DIAGNOSIS — J310 Chronic rhinitis: Secondary | ICD-10-CM

## 2024-12-09 ENCOUNTER — Other Ambulatory Visit: Payer: Self-pay

## 2024-12-09 ENCOUNTER — Telehealth (INDEPENDENT_AMBULATORY_CARE_PROVIDER_SITE_OTHER): Payer: Self-pay | Admitting: Otolaryngology

## 2024-12-09 DIAGNOSIS — K219 Gastro-esophageal reflux disease without esophagitis: Secondary | ICD-10-CM

## 2024-12-09 DIAGNOSIS — J343 Hypertrophy of nasal turbinates: Secondary | ICD-10-CM | POA: Insufficient documentation

## 2024-12-09 DIAGNOSIS — J310 Chronic rhinitis: Secondary | ICD-10-CM | POA: Insufficient documentation

## 2024-12-09 DIAGNOSIS — G500 Trigeminal neuralgia: Secondary | ICD-10-CM

## 2024-12-09 DIAGNOSIS — J4550 Severe persistent asthma, uncomplicated: Secondary | ICD-10-CM

## 2024-12-09 DIAGNOSIS — H6523 Chronic serous otitis media, bilateral: Secondary | ICD-10-CM | POA: Insufficient documentation

## 2024-12-09 DIAGNOSIS — H6983 Other specified disorders of Eustachian tube, bilateral: Secondary | ICD-10-CM | POA: Insufficient documentation

## 2024-12-09 MED ORDER — BUDESONIDE-FORMOTEROL FUMARATE 80-4.5 MCG/ACT IN AERO: 2.0000 | INHALATION_SPRAY | Freq: Two times a day (BID) | RESPIRATORY_TRACT | 5 refills | Status: AC | PRN

## 2024-12-09 MED ORDER — MONTELUKAST SODIUM 10 MG PO TABS: ORAL_TABLET | ORAL | 3 refills | Status: AC

## 2024-12-09 MED ORDER — PANTOPRAZOLE SODIUM 40 MG PO TBEC: 40.0000 mg | DELAYED_RELEASE_TABLET | Freq: Two times a day (BID) | ORAL | 5 refills | Status: AC

## 2024-12-09 NOTE — Progress Notes (Signed)
 CC: Bilateral middle ear fluid, eustachian tube dysfunction  Discussed the use of AI scribe software for clinical note transcription with the patient, who gave verbal consent to proceed.  History of Present Illness Jennifer Mullins is a 54 year old female with chronic allergic rhinitis and prior sinus surgery who presents with persistent bilateral middle ear effusion and eustachian tube dysfunction.  Since October 2025, she has experienced persistent bilateral middle ear effusion, described as a sensation of fullness and fluid in both ears, with associated impaired balance, difficulty hearing in noisy environments, and nocturnal sensation of fluid movement. These symptoms have significantly disrupted her sleep and daily activities. She notes intermittent mild otalgia and reports recurrent episodes annually.  In November 2025, her primary care physician identified middle ear effusion and diagnosed acute sinusitis, for which she received antibiotics. Despite treatment, the effusion persisted into December 2025, as confirmed by her allergist. She received two steroid injections in December and has undergone multiple courses of antibiotics for sinus infections, without significant improvement in her otologic symptoms.  Her current regimen includes daily Zyrtec , Mucinex  D as needed for severe sinus congestion, steroid nasal spray, and weekly allergy immunotherapy, none of which have provided substantial relief. She performs saline nasal irrigation but discontinued for several months due to increased head pressure. Nasal congestion remains prominent, with minimal improvement despite ongoing therapy. She notes occasional facial pain and pressure, particularly with cold air exposure.  She underwent sinus surgery over ten years ago for chronic rhinosinusitis. Despite prior surgical intervention and ongoing medical management, her symptoms have persisted and become increasingly intolerable.     Past Medical  History:  Diagnosis Date   Anxiety    Arm vein blood clot, left    Arthritis    Asthma    Bilateral calcaneal spurs    Bilateral polycystic ovarian syndrome    Bulging lumbar disc    Carpal tunnel syndrome, bilateral    COPD (chronic obstructive pulmonary disease) (HCC)    BRONCHITIS   Diabetes mellitus without complication (HCC)    TYPE 2   DX  4-5 YRS AGO   ETD (Eustachian tube dysfunction), bilateral    left worse than right   Fibromyalgia    GAD (generalized anxiety disorder)    GERD (gastroesophageal reflux disease)    TAKES PRESCRIPTION MEDS   Glaucoma    BOTH EYES   Hyperlipidemia    Hypertension    Hypothyroidism    NODULES ON THYROID    PONV (postoperative nausea and vomiting)    Pulmonary embolism (HCC)    Tachycardia    TMJ (dislocation of temporomandibular joint)    Trigger finger    Trigger finger of left thumb     Past Surgical History:  Procedure Laterality Date   ANTERIOR CERVICAL DECOMP/DISCECTOMY FUSION N/A 11/13/2016   Procedure: Cervical five-six, Cervical six-seven Anterior Cervical Discectomy and Fusion, Allograft, Plate;  Surgeon: Oneil JAYSON Herald, MD;  Location: [facility] OR;  Service: Orthopedics;  Laterality: N/A;   BREAST SURGERY     CARPAL TUNNEL RELEASE     DILATION AND CURETTAGE OF UTERUS     EXPLORATORY LAPAROTOMY     LIPOMA EXCISION Left 06/15/2018   Procedure: LEFT CALF LIPOMA EXCISION;  Surgeon: Herald Oneil JAYSON, MD;  Location: [facility];  Service: Orthopedics;  Laterality: Left;   NASAL SINUS SURGERY  07/22/2013   Dr. Tanda in [place]   plate and six screws in back     REDUCTION MAMMAPLASTY Bilateral 2000  TRIGGER FINGER RELEASE Right 06/15/2018   Procedure: RIGHT TRIGGER THUMB RELEASE;  Surgeon: Barbarann Oneil BROCKS, MD;  Location: [facility];  Service: Orthopedics;  Laterality: Right;    Family History  Problem Relation Age of Onset   Hypertension Mother    Hypertension Father    Cancer Son    Colon cancer Neg Hx     Rectal cancer Neg Hx    Stomach cancer Neg Hx    Esophageal cancer Neg Hx    BRCA 1/2 Neg Hx    Breast cancer Neg Hx     Social History:  reports that she quit smoking about 35 years ago. Her smoking use included cigarettes. She started smoking about 40 years ago. She has a 1.3 pack-year smoking history. She has never been exposed to tobacco smoke. She has never used smokeless tobacco. She reports that she does not drink alcohol  and does not use drugs.  Allergies: Allergies[1]  Prior to Admission medications  Medication Sig Start Date End Date Taking? Authorizing Provider  ALPRAZolam  (XANAX ) 0.5 MG tablet TAKE 1 TABLET BY MOUTH TWICE DAILY AS NEEDED. MAY TAKE ONE MORE TABLET BEFORE FOR ALLERGY SHOTS ONCE A WEEK (30 DAY supply) 01/05/20  Yes Joshua Clayborne RAMAN, PA-C  aspirin  81 MG chewable tablet Chew 81 mg by mouth daily. Take 2 tablets for a total of 162 mg daily   Yes [provider]  AUVI-Q  0.3 MG/0.3ML SOAJ injection Use for life threatening allergic reactions 03/09/24  Yes Kozlow, Eric J, MD  azelastine  (ASTELIN ) 0.1 % nasal spray Place 1 spray into both nostrils 2 (two) times daily. Use in each nostril as directed 11/17/24  Yes Stuart Vernell Norris, PA-C  budesonide  ([facility] BUDESONIDE ) 32 MCG/ACT nasal spray USE 1-2 SPRAYS INTO BOTH NOSTRILS EVERY DAY 03/09/24  Yes Kozlow, Camellia PARAS, MD  cetirizine  (ZYRTEC ) 10 MG tablet TAKE 1 TABLET (10 MG TOTAL) BY MOUTH 2 (TWO) TIMES DAILY AS NEEDED FOR ALLERGIES. 09/14/24  Yes Kozlow, Camellia PARAS, MD  cromolyn  (OPTICROM ) 4 % ophthalmic solution Place 1 drop into both eyes 4 (four) times daily.  03/20/17  Yes [provider]  cyproheptadine  (PERIACTIN ) 4 MG tablet Take 0.5-1 tablets (2-4 mg total) by mouth at bedtime. 11/16/24  Yes Kozlow, Eric J, MD  diclofenac  Sodium (VOLTAREN ) 1 % GEL Apply 2 g topically 4 (four) times daily. 09/28/24  Yes [provider]  Diethylpropion  HCl CR 75 MG TB24 Take 1 tablet (75 mg total) by mouth daily.  01/05/20  Yes Joshua Clayborne RAMAN, PA-C  dorzolamide-timolol (COSOPT) 22.3-6.8 MG/ML ophthalmic solution Place 1 drop into both eyes 2 (two) times daily. 03/26/22  Yes [provider]  ELIQUIS  2.5 MG TABS tablet Take 2.5 mg by mouth 2 (two) times daily. 11/12/21  Yes [provider]  EPINEPHrine  0.3 mg/0.3 mL IJ SOAJ injection Inject 0.3 mg into the muscle as needed for anaphylaxis. 02/09/24  Yes Kozlow, Eric J, MD  gabapentin (NEURONTIN) 100 MG capsule 2 capsules 2 (two) times daily. 11/04/24  Yes [provider]  HYDROcodone -acetaminophen  (NORCO/VICODIN) 5-325 MG tablet Take 1 tablet by mouth every 8 (eight) hours as needed. 07/22/24  Yes [provider]  lubiprostone (AMITIZA) 8 MCG capsule SMARTSIG:1 Capsule(s) By Mouth 1-4 Times Daily 02/26/22  Yes [provider]  Mepolizumab  (NUCALA ) 100 MG/ML SOAJ Inject 1 mL (100 mg total) into the skin every 28 (twenty-eight) days. 12/02/24  Yes Kozlow, Camellia PARAS, MD  metFORMIN  (GLUCOPHAGE ) 500 MG tablet TAKE 1 TABLET  BY MOUTH EVERY DAY WITH BREAKFAST 07/05/19  Yes Joshua Clayborne RAMAN, PA-C  pravastatin  (PRAVACHOL ) 40 MG tablet Take 1 tablet (40 mg total) by mouth daily. Patient taking differently: Take 40 mg by mouth 2 (two) times daily. 02/04/20  Yes Dettinger, Fonda LABOR, MD  progesterone  (PROMETRIUM ) 200 MG capsule TAKE 1 CAPSULE BY MOUTH EVERYDAY AT BEDTIME 09/19/24  Yes Jayne Vonn DEL, MD  spironolactone  (ALDACTONE ) 25 MG tablet Take 25 mg by mouth 2 (two) times daily. 02/23/24  Yes [provider]  UNABLE TO FIND Allergy shots-both arms-weekly   Yes [provider]  budesonide -formoterol  (SYMBICORT ) 80-4.5 MCG/ACT inhaler Inhale 2 puffs into the lungs 2 (two) times daily as needed. 12/09/24   Iva Marty Saltness, MD  montelukast  (SINGULAIR ) 10 MG tablet TAKE 1 TABLET BY MOUTH EVERYDAY AT BEDTIME 12/09/24   Iva Marty Saltness, MD  pantoprazole  (PROTONIX ) 40 MG tablet Take 1 tablet (40 mg total) by mouth 2 (two)  times daily. 12/09/24   Iva Marty Saltness, MD    Blood pressure 106/71, pulse 78, height 5' 2 (1.575 m), weight 136 lb (61.7 kg), SpO2 97%. Exam: General: Communicates without difficulty, well nourished, no acute distress. Head: Normocephalic, no evidence injury, no tenderness, facial buttresses intact without stepoff. Face/sinus: No tenderness to palpation and percussion. Facial movement is normal and symmetric. Eyes: PERRL, EOMI. No scleral icterus, conjunctivae clear. Neuro: CN II exam reveals vision grossly intact.  No nystagmus at any point of gaze. Ears: Auricles well formed without lesions.  Ear canals are intact without mass or lesion.  No erythema or edema is appreciated.  The TMs are intact with with partial serous fluid bilaterally. Nose: External evaluation reveals normal support and skin without lesions.  Dorsum is intact.  Anterior rhinoscopy reveals congested mucosa over anterior aspect of inferior turbinates and intact septum.  No purulence noted. Oral:  Oral cavity and oropharynx are intact, symmetric, without erythema or edema.  Mucosa is moist without lesions. Neck: Full range of motion without pain.  There is no significant lymphadenopathy.  No masses palpable.  Thyroid  bed within normal limits to palpation.  Parotid glands and submandibular glands equal bilaterally without mass.  Trachea is midline. Neuro:  CN 2-12 grossly intact.   Procedure:  Flexible Nasal Endoscopy: Description: Risks, benefits, and alternatives of flexible endoscopy were explained to the patient.  Specific mention was made of the risk of throat numbness with difficulty swallowing, possible bleeding from the nose and mouth, and pain from the procedure.  The patient gave oral consent to proceed.  The flexible scope was inserted into the right nasal cavity.  Endoscopy of the interior nasal cavity, superior, inferior, and middle meatus was performed. The sphenoid-ethmoid recess was examined. Edematous mucosa was  noted.  No polyp, mass, or lesion was appreciated. Olfactory cleft was clear.  Nasopharynx was clear.  Turbinates were hypertrophied but without mass.  The procedure was repeated on the contralateral side with similar findings.  The patient tolerated the procedure well.   Assessment & Plan Bilateral eustachian tube dysfunction with bilateral middle ear effusion She has persistent, refractory bilateral middle ear effusion and eustachian tube dysfunction, resulting in significant impairment of quality of life. Symptoms have not improved with multiple courses of antibiotics, intranasal corticosteroids, subcutaneous immunotherapy, and oral antihistamines. The etiology is ongoing nasal congestion and allergic inflammation.  - Scheduled bilateral tympanostomy tube placement within the next week, to be performed in the office under local anesthesia without sedation or intravenous medications.  Chronic  allergic rhinitis and bilateral inferior turbinate hypertrophy She has chronic allergic rhinitis with persistent nasal congestion and bilateral inferior turbinate hypertrophy, refractory to current medical therapy. Previous sinus surgery was performed over ten years ago. Ongoing allergy management is necessary, as tympanostomy tube placement will not address nasal and sinus symptoms. - Reinforced continued use of allergy medications, including intranasal corticosteroid spray, oral antihistamines, and subcutaneous immunotherapy. - The option of bilateral turbinate reduction is also discussed.  Demontae Antunes W Fontella Shan 12/09/2024, 10:27 AM      [1]  Allergies Allergen Reactions   Demerol [Meperidine] Shortness Of Breath    FLUSHING AND SHORTNESS OF BREATH   Morphine  And Codeine  Shortness Of Breath    Flushed and hot hyper Flushed and hot hyper Flushed and hot hyper Flushed and hot hyper   Other Shortness Of Breath    Flushed and hot hyper Flushed and hot hyper Flushed and hot hyper Flushed and hot hyper Flushed  and hot hyper   Cardizem  [Diltiazem Hcl] Other (See Comments)   Diltiazem Hcl Hives   Lisinopril      Caused cough   Lyrica [Pregabalin]     Chest pain, elevated heart rate, joint swelling, stomach swelling and severe constipation   Tramadol  Other (See Comments)    Tears up stomach

## 2024-12-09 NOTE — Telephone Encounter (Signed)
 Patient Jennifer Mullins called 12/09/2024 at 10:07 am and wanted to see if Dr. Karis could give her some drops to help dry the fluid up in her ear. She has an appointment on Wednesday 12/15/2024 and just in case she can't make it in due to the weather she would like the drops if possible.   Thank you

## 2024-12-09 NOTE — Telephone Encounter (Signed)
 Per Dr.Teoh, I told patient that the ears drops is not going to help her condition and to use Flonase  nasal spray, which was called  in at [facility] in [place] on [address] with 5 refill.

## 2024-12-14 ENCOUNTER — Ambulatory Visit

## 2024-12-15 ENCOUNTER — Ambulatory Visit (INDEPENDENT_AMBULATORY_CARE_PROVIDER_SITE_OTHER): Admitting: Otolaryngology

## 2024-12-15 VITALS — BP 103/70 | HR 89

## 2024-12-15 DIAGNOSIS — R040 Epistaxis: Secondary | ICD-10-CM

## 2024-12-15 DIAGNOSIS — H6983 Other specified disorders of Eustachian tube, bilateral: Secondary | ICD-10-CM

## 2024-12-15 DIAGNOSIS — H6523 Chronic serous otitis media, bilateral: Secondary | ICD-10-CM | POA: Diagnosis not present

## 2024-12-15 MED ORDER — MUPIROCIN 2 % EX OINT: 1.0000 | TOPICAL_OINTMENT | Freq: Three times a day (TID) | CUTANEOUS | 3 refills | Status: AC | PRN

## 2024-12-15 NOTE — Progress Notes (Unsigned)
 Patient ID: Jennifer Mullins, female   DOB: 04/15/1971, 54 y.o.   MRN: [id number]  Procedure:  Endoscopic control of recurrent right epistaxis  Indication: The patient presents today with a new complaint of recurrent right epistaxis for the past 4 days.  The bleeding is intermittently severe.  She continues to have frequent bloody drainage down her throat.  She denies any recent nasal trauma.  Description:  The right nasal cavity is sprayed with topical xylocaine  and neo-synephrine.  After adequate anesthesia is achieved, the nasal cavity is examined with a 0 rigid endoscope.  A suction catheter is inserted in parallel with the 0 endoscope, and it is used to suction blood clots from the right nasal cavity.  Hypervascular areas are noted at the anterior and superior aspect of the nasal septum.  A silver nitrate stick is inserted in parallel with the 0 endoscope.  It is used to repeatedly cauterized the hypervascular areas.  Good hemostasis is achieved.  The patient tolerated the procedure well.  Assessment: 1.  Hypervascular areas are noted on the right anterior and superior nasal septum.   2.  Active bleeding is noted today.    Plan: 1. Endoscopic cauterization of the right anterior and superior nasal septum. 2. The nasal endoscopy findings are reviewed with the patient. 3. Nasal ointment/humidifier to treat the nasal dryness. 4. The patient will return for re-evaluation in 1 month.   ---------------------------------------------  Procedure: Bilateral myringotomy and tube placement.   Indication: Chronic eustachian tube dysfunction and middle ear effusion, not responding to medical treatment.    Descriptions: The patient was placed supine on the exam table.  Under the operating microscope, the right ear canal was cleaned of all cerumen.  1% lidocaine  was injected into all four quadrants of the ear canal. After adequate local anesthesia was achieved, a standard myringotomy incision was made at  the anterior inferior quadrant of the tympanic membrane.  A moderate amount of serous fluid was suctioned from behind the tympanic membrane.  A collar button tube was placed without difficulty.  The same procedure was repeated on the left side without exception.  The patient tolerated the procedure well.    Follow-up care: Dry ear precaution. The patient will return for reevaluation in approximately one month.

## 2024-12-16 ENCOUNTER — Telehealth (INDEPENDENT_AMBULATORY_CARE_PROVIDER_SITE_OTHER): Payer: Self-pay | Admitting: Otolaryngology

## 2024-12-16 DIAGNOSIS — R040 Epistaxis: Secondary | ICD-10-CM | POA: Insufficient documentation

## 2024-12-16 NOTE — Telephone Encounter (Signed)
 Returned patient's call. Per Dr. Karis , I told her that having some nose bleed after nasal cautery is normal.Told her to use the Bactroban  ointment and humidifier at night. Also informed her that it may take some time to adjust to the ear tubes. Called in Ciprodex  eardrops to [facility] on [address] in [place]. She will follow up on 01/13/25 or sooner if needed.

## 2024-12-16 NOTE — Telephone Encounter (Signed)
 I spoke with patient when she called into office at 3:18pm. She stated that Dr. Karis has not called her back yet. I informed her that Dr. Karis does not coming in office until lunch or a little after. He is still in clinic seeing patient right now. He will call after he sees patient and I will inform his MA as a reminder. And I do so.  Patient understood.

## 2024-12-16 NOTE — Telephone Encounter (Signed)
 Mrs. Jennifer Mullins called 12/16/2024 at 1:08 pm and said that her nostril on the right side is bleeding and has some drainage. Also she wanted to know if Dr. Karis had some type of packing in her nose if so she think it has come out.  She would also like to know when does she need to take the cotton out of her ear. Her left ear is hurting and she is having some pain in that ear and it sounds like something is running in her ear. She would like a call back please

## 2024-12-24 NOTE — Telephone Encounter (Signed)
 Error

## 2025-01-06 ENCOUNTER — Ambulatory Visit

## 2025-01-13 ENCOUNTER — Ambulatory Visit (INDEPENDENT_AMBULATORY_CARE_PROVIDER_SITE_OTHER): Admitting: Otolaryngology

## 2025-05-17 ENCOUNTER — Ambulatory Visit: Admitting: Allergy and Immunology
